# Patient Record
Sex: Female | Born: 1940 | Race: White | Hispanic: No | Smoking: Former smoker
Health system: Southern US, Community
[De-identification: ages and names within clinical notes are randomized; demographics above are authoritative.]

## PROBLEM LIST (undated history)

## (undated) DIAGNOSIS — E785 Hyperlipidemia, unspecified: Secondary | ICD-10-CM

## (undated) DIAGNOSIS — I10 Essential (primary) hypertension: Secondary | ICD-10-CM

## (undated) DIAGNOSIS — J449 Chronic obstructive pulmonary disease, unspecified: Secondary | ICD-10-CM

## (undated) DIAGNOSIS — D801 Nonfamilial hypogammaglobulinemia: Secondary | ICD-10-CM

## (undated) DIAGNOSIS — D631 Anemia in chronic kidney disease: Secondary | ICD-10-CM

## (undated) DIAGNOSIS — N183 Chronic kidney disease, stage 3 (moderate): Secondary | ICD-10-CM

## (undated) DIAGNOSIS — C9000 Multiple myeloma not having achieved remission: Secondary | ICD-10-CM

## (undated) DIAGNOSIS — Z87891 Personal history of nicotine dependence: Secondary | ICD-10-CM

## (undated) DIAGNOSIS — E538 Deficiency of other specified B group vitamins: Secondary | ICD-10-CM

## (undated) DIAGNOSIS — I2510 Atherosclerotic heart disease of native coronary artery without angina pectoris: Secondary | ICD-10-CM

## (undated) HISTORY — DX: Hyperlipidemia, unspecified: E78.5

## (undated) HISTORY — DX: Anemia in chronic kidney disease: D63.1

## (undated) HISTORY — DX: Deficiency of other specified B group vitamins: E53.8

## (undated) HISTORY — DX: Multiple myeloma not having achieved remission: C90.00

## (undated) HISTORY — DX: Personal history of nicotine dependence: Z87.891

## (undated) HISTORY — DX: Nonfamilial hypogammaglobulinemia: D80.1

## (undated) HISTORY — DX: Atherosclerotic heart disease of native coronary artery without angina pectoris: I25.10

## (undated) HISTORY — DX: Chronic obstructive pulmonary disease, unspecified: J44.9

## (undated) HISTORY — DX: Essential (primary) hypertension: I10

## (undated) HISTORY — PX: BACK SURGERY: SHX140

## (undated) HISTORY — DX: Chronic kidney disease, stage 3 (moderate): N18.3

## (undated) HISTORY — PX: NECK SURGERY: SHX720

---

## 1998-04-16 ENCOUNTER — Ambulatory Visit (HOSPITAL_COMMUNITY): Admission: RE | Admit: 1998-04-16 | Discharge: 1998-04-16 | Payer: Self-pay | Admitting: Neurological Surgery

## 1999-03-25 ENCOUNTER — Inpatient Hospital Stay (HOSPITAL_COMMUNITY): Admission: AD | Admit: 1999-03-25 | Discharge: 1999-03-26 | Payer: Self-pay | Admitting: Cardiology

## 1999-05-25 ENCOUNTER — Encounter: Payer: Self-pay | Admitting: Neurological Surgery

## 1999-05-25 ENCOUNTER — Encounter: Admission: RE | Admit: 1999-05-25 | Discharge: 1999-05-25 | Payer: Self-pay | Admitting: Neurological Surgery

## 2005-11-08 ENCOUNTER — Encounter: Payer: Self-pay | Admitting: Neurological Surgery

## 2007-05-30 ENCOUNTER — Ambulatory Visit: Payer: Self-pay | Admitting: Cardiology

## 2007-07-01 ENCOUNTER — Ambulatory Visit: Payer: Self-pay | Admitting: Cardiology

## 2007-08-26 ENCOUNTER — Ambulatory Visit: Payer: Self-pay | Admitting: Cardiology

## 2010-11-17 LAB — PULMONARY FUNCTION TEST

## 2010-11-22 NOTE — Assessment & Plan Note (Signed)
[facility]                          [place] [facility] OFFICE NOTE   AARIAN, Bradley                         MRN:          [id number]  DATE:07/01/2007                            DOB:          1940/10/10    [name] is seen for cardiology followup.  She is not having any chest  discomfort.  In general, she is not feeling well, but it is not exactly  clear why she is feeling poorly.  We had seen her from a recent  hospitalization on May 30, 2007.  At that time, she had  hypertension and some exertional shortness of breath, and a headache.  During the hospitalization, there was some improvement in the headache.  Eventually, neurology was involved, and she is still seeing neurology.  We decided to proceed with 2D echo.  This study revealed that the  patient's overall left ventricular function was normal.  The ejection  fraction was 60-65%.  There was mild diastolic dysfunction.  There was  evidence of pulmonary hypertension with a right ventricular systolic  pressure estimate of 53 mmHg.  The patient also underwent a Cardiolite  scan.  This was done using adenosine.  The study revealed that there was  no scar or ischemia.  Because of the pulmonary hypertension and because  of some abnormality in her chest x-ray, there was consideration given to  the patient having a CT scan of the chest.  She did have a VQ scan that  showed no pulmonary emboli.  I cannot document that a chest CT was ever  done.   Ultimately, the patient stabilized and she was discharged home.  She is  now here for followup.  She is not having any chest pain or marked  shortness of breath.  She just does not feel well in general and is  quite concerned.   PAST MEDICAL HISTORY:   ALLERGIES:  No known drug allergies.   MEDICATIONS:  1. Aspirin 81.  2. Metoprolol 25 b.i.d.   OTHER MEDICAL PROBLEMS:  See the list below.   REVIEW OF SYSTEMS:  Today, she continues to have some  mild headaches and  some light-headedness.  She does have some mild shortness of breath.  Also, her blood pressure is elevated and she is concerned about this.  Otherwise, her review of systems is negative.   PHYSICAL EXAM:  Blood pressure today is 166/93, pulse 78.  The patient is oriented to person, time, and place.  Affect is normal,  but she is concerned about how she is feeling.  LUNGS:  Clear.  Respiratory effort is not labored.  HEENT:  Reveals no xanthelasma.  She has normal extraocular motions.  There are no carotid bruits.  There is no jugular venous distension.  CARDIAC:  Reveals an S1 with an S2.  There are no clicks or significant  murmurs.  ABDOMEN:  Soft.  She has no masses or bruits.  There is no significant  peripheral edema.   EKG today reveals Q waves in V1 and V2.  There is normal sinus rhythm.   PROBLEMS:  1. Pulmonary hypertension.  Right ventricular systolic pressure has      been in the range of 50 mmHg.  This may be due to intrinsic lung      disease.  I think that it would be helpful to consider pulmonary      consultation and/or pulmonary function studies.  However, these      have not been arranged.  There was question also in the past that      she might have some parenchymal lung disease.  There was a question      of some old mediastinal nodes on 1 study.  Her x-ray during her      most recent admission revealed that she had some slight      hyperinflation with question of some emphysematous changes.  There      was no new acute process.  2. Recent chest discomfort with normal Cardiolite.  3. Normal left ventricular function by 2D echo.  4. Remote tobacco history.  5. Hypertension.  The patient does need additional medicines for her      blood pressure and I will start an ACE inhibitor today.  6. History of hyperlipidemia and this needs to be followed up.  7. Ongoing headaches.  This is being assessed by neurology.  8. Mild diastolic dysfunction.    I will add lisinopril for her blood pressure.  She will follow up with  Dr. Sherril Croon and with Dr. Ninetta Lights of neurology.  It may be that her  pulmonary hypertension is related to chronic obstructive pulmonary  disease.     Luis Abed, MD, [facility]  Electronically Signed    JDK/MedQ  DD: 07/01/2007  DT: 07/01/2007  Job #: [id number]   cc:   Mardella Layman

## 2011-02-10 ENCOUNTER — Telehealth: Payer: Self-pay | Admitting: *Deleted

## 2011-02-10 NOTE — Telephone Encounter (Signed)
Morrie Sheldon from [facility] office called and referred this pt for a right heart cath.  He requested Dr Gala Romney to do the procedure.  Pt has not been seen by our office.  She has had a recent echo but no EKG.  Informed Morrie Sheldon that I will review with Dr Gala Romney to see if the pt needs to be seen first and then cathed or if she can scheduled.  Morrie Sheldon will fax records to [place].

## 2011-02-13 NOTE — Telephone Encounter (Signed)
Please get records and set up for RHC in [facility]. I will see her there and do brief H&P.

## 2011-02-21 NOTE — Telephone Encounter (Signed)
Per Meredith Staggers, RN this is being taken care of.

## 2011-03-02 NOTE — Telephone Encounter (Signed)
Have been trying to reach pt for a couple of weeks to sch cath, have left several mess for her to call back

## 2011-03-24 ENCOUNTER — Encounter (HOSPITAL_COMMUNITY): Payer: Self-pay | Admitting: *Deleted

## 2011-03-24 NOTE — Telephone Encounter (Signed)
After weeks of phone tag have finally gotten pt scheduled for RHC on Mon 10/8 (she needs Monday appts only) at 9:30, pt is aware and instructions reviewed with her via phone and copy mailed, she will go to the [place] center teh week of 10/1 for labs

## 2011-04-10 HISTORY — PX: CORONARY ANGIOPLASTY WITH STENT PLACEMENT: SHX49

## 2011-04-10 HISTORY — PX: CARDIAC CATHETERIZATION: SHX172

## 2011-05-03 ENCOUNTER — Inpatient Hospital Stay (HOSPITAL_BASED_OUTPATIENT_CLINIC_OR_DEPARTMENT_OTHER)
Admission: RE | Admit: 2011-05-03 | Discharge: 2011-05-03 | Disposition: A | Discharge status: Hospice | Payer: Medicare Other | Source: Ambulatory Visit | Attending: Internal Medicine | Admitting: Internal Medicine

## 2011-05-03 ENCOUNTER — Ambulatory Visit (HOSPITAL_COMMUNITY)
Admission: RE | Admit: 2011-05-03 | Discharge: 2011-05-04 | Disposition: A | Discharge status: Home / Self Care | Payer: Medicare Other | Source: Ambulatory Visit | Attending: Cardiovascular Disease | Admitting: Cardiovascular Disease

## 2011-05-03 DIAGNOSIS — I10 Essential (primary) hypertension: Secondary | ICD-10-CM | POA: Insufficient documentation

## 2011-05-03 DIAGNOSIS — J449 Chronic obstructive pulmonary disease, unspecified: Secondary | ICD-10-CM | POA: Insufficient documentation

## 2011-05-03 DIAGNOSIS — J4489 Other specified chronic obstructive pulmonary disease: Secondary | ICD-10-CM | POA: Insufficient documentation

## 2011-05-03 DIAGNOSIS — I2789 Other specified pulmonary heart diseases: Secondary | ICD-10-CM | POA: Insufficient documentation

## 2011-05-03 DIAGNOSIS — I2510 Atherosclerotic heart disease of native coronary artery without angina pectoris: Secondary | ICD-10-CM

## 2011-05-03 DIAGNOSIS — R0989 Other specified symptoms and signs involving the circulatory and respiratory systems: Secondary | ICD-10-CM | POA: Insufficient documentation

## 2011-05-03 DIAGNOSIS — I279 Pulmonary heart disease, unspecified: Secondary | ICD-10-CM

## 2011-05-03 DIAGNOSIS — R0609 Other forms of dyspnea: Secondary | ICD-10-CM | POA: Insufficient documentation

## 2011-05-03 LAB — POCT I-STAT 3, VENOUS BLOOD GAS (G3P V)
Acid-base deficit: 3 mmol/L — ABNORMAL HIGH (ref 0.0–2.0)
Bicarbonate: 22.9 mEq/L (ref 20.0–24.0)
Bicarbonate: 25.2 mEq/L — ABNORMAL HIGH (ref 20.0–24.0)
O2 Saturation: 66 %
O2 Saturation: 58 %
O2 Saturation: 65 %
TCO2: 24 mmol/L (ref 0–100)
pCO2, Ven: 43.7 mmHg — ABNORMAL LOW (ref 45.0–50.0)
pH, Ven: 7.327 — ABNORMAL HIGH (ref 7.250–7.300)
pO2, Ven: 37 mmHg (ref 30.0–45.0)
pO2, Ven: 35 mmHg (ref 30.0–45.0)

## 2011-05-03 LAB — POCT ACTIVATED CLOTTING TIME
Activated Clotting Time: 138 seconds
Activated Clotting Time: 408 seconds

## 2011-05-03 LAB — POCT I-STAT 3, ART BLOOD GAS (G3+)
Acid-base deficit: 1 mmol/L (ref 0.0–2.0)
Bicarbonate: 23.4 mEq/L (ref 20.0–24.0)
O2 Saturation: 92 %
TCO2: 25 mmol/L (ref 0–100)
pCO2 arterial: 39.2 mmHg (ref 35.0–45.0)
pH, Arterial: 7.384 (ref 7.350–7.400)
pO2, Arterial: 63 mmHg — ABNORMAL LOW (ref 80.0–100.0)

## 2011-05-04 LAB — LIPID PANEL
Cholesterol: 168 mg/dL (ref 0–200)
HDL: 35 mg/dL — ABNORMAL LOW (ref >39)
Total CHOL/HDL Ratio: 4.8 RATIO

## 2011-05-04 LAB — BASIC METABOLIC PANEL
Chloride: 105 mEq/L (ref 96–112)
GFR calc Af Amer: 70 mL/min — ABNORMAL LOW (ref >90)
Potassium: 3.9 mEq/L (ref 3.5–5.1)

## 2011-05-04 LAB — CBC
Platelets: 166 10*3/uL (ref 150–400)
RDW: 12.9 % (ref 11.5–15.5)
WBC: 7.2 10*3/uL (ref 4.0–10.5)

## 2011-05-05 ENCOUNTER — Emergency Department (HOSPITAL_COMMUNITY): Payer: Medicare Other

## 2011-05-05 ENCOUNTER — Observation Stay (HOSPITAL_COMMUNITY)
Admission: EM | Admit: 2011-05-05 | Discharge: 2011-05-07 | Disposition: A | Discharge status: Home / Self Care | Payer: Medicare Other | Attending: Emergency Medicine | Admitting: Emergency Medicine

## 2011-05-05 DIAGNOSIS — R42 Dizziness and giddiness: Secondary | ICD-10-CM | POA: Insufficient documentation

## 2011-05-05 DIAGNOSIS — R0989 Other specified symptoms and signs involving the circulatory and respiratory systems: Principal | ICD-10-CM | POA: Insufficient documentation

## 2011-05-05 DIAGNOSIS — E860 Dehydration: Secondary | ICD-10-CM | POA: Insufficient documentation

## 2011-05-05 DIAGNOSIS — Z01810 Encounter for preprocedural cardiovascular examination: Secondary | ICD-10-CM | POA: Insufficient documentation

## 2011-05-05 DIAGNOSIS — J449 Chronic obstructive pulmonary disease, unspecified: Secondary | ICD-10-CM | POA: Insufficient documentation

## 2011-05-05 DIAGNOSIS — R141 Gas pain: Secondary | ICD-10-CM | POA: Insufficient documentation

## 2011-05-05 DIAGNOSIS — R0602 Shortness of breath: Secondary | ICD-10-CM

## 2011-05-05 DIAGNOSIS — R0609 Other forms of dyspnea: Principal | ICD-10-CM | POA: Insufficient documentation

## 2011-05-05 DIAGNOSIS — Z9861 Coronary angioplasty status: Secondary | ICD-10-CM | POA: Insufficient documentation

## 2011-05-05 DIAGNOSIS — R143 Flatulence: Secondary | ICD-10-CM | POA: Insufficient documentation

## 2011-05-05 DIAGNOSIS — I10 Essential (primary) hypertension: Secondary | ICD-10-CM | POA: Insufficient documentation

## 2011-05-05 DIAGNOSIS — Z23 Encounter for immunization: Secondary | ICD-10-CM | POA: Insufficient documentation

## 2011-05-05 DIAGNOSIS — I2510 Atherosclerotic heart disease of native coronary artery without angina pectoris: Secondary | ICD-10-CM | POA: Insufficient documentation

## 2011-05-05 DIAGNOSIS — N63 Unspecified lump in unspecified breast: Secondary | ICD-10-CM | POA: Insufficient documentation

## 2011-05-05 DIAGNOSIS — R142 Eructation: Secondary | ICD-10-CM | POA: Insufficient documentation

## 2011-05-05 DIAGNOSIS — J4489 Other specified chronic obstructive pulmonary disease: Secondary | ICD-10-CM | POA: Insufficient documentation

## 2011-05-05 LAB — CBC
Hemoglobin: 13.2 g/dL (ref 12.0–15.0)
MCH: 30.7 pg (ref 26.0–34.0)
MCHC: 35.8 g/dL (ref 30.0–36.0)
MCV: 85.8 fL (ref 78.0–100.0)

## 2011-05-05 LAB — BASIC METABOLIC PANEL
CO2: 25 mEq/L (ref 19–32)
Chloride: 101 mEq/L (ref 96–112)
Creatinine, Ser: 0.88 mg/dL (ref 0.50–1.10)
GFR calc Af Amer: 75 mL/min — ABNORMAL LOW (ref >90)
Potassium: 3.6 mEq/L (ref 3.5–5.1)
Sodium: 137 mEq/L (ref 135–145)

## 2011-05-05 LAB — CARDIAC PANEL(CRET KIN+CKTOT+MB+TROPI)
CK, MB: 4.7 ng/mL — ABNORMAL HIGH (ref 0.3–4.0)
Relative Index: 3.2 — ABNORMAL HIGH (ref 0.0–2.5)
Total CK: 147 U/L (ref 7–177)
Troponin I: <0.3 ng/mL (ref <0.30)

## 2011-05-05 LAB — DIFFERENTIAL
Basophils Relative: 0 % (ref 0–1)
Eosinophils Absolute: 0.1 10*3/uL (ref 0.0–0.7)
Lymphs Abs: 1.6 10*3/uL (ref 0.7–4.0)
Monocytes Absolute: 0.9 10*3/uL (ref 0.1–1.0)
Monocytes Relative: 8 % (ref 3–12)
Neutro Abs: 8.2 10*3/uL — ABNORMAL HIGH (ref 1.7–7.7)

## 2011-05-05 LAB — APTT: aPTT: 29 seconds (ref 24–37)

## 2011-05-05 LAB — POCT I-STAT TROPONIN I: Troponin i, poc: 0.02 ng/mL (ref 0.00–0.08)

## 2011-05-05 MED ORDER — IOHEXOL 350 MG/ML SOLN
80.0000 mL | Freq: Once | INTRAVENOUS | Status: AC | PRN
  Administered 2011-05-05: 80 mL via INTRAVENOUS

## 2011-05-06 DIAGNOSIS — I2510 Atherosclerotic heart disease of native coronary artery without angina pectoris: Secondary | ICD-10-CM

## 2011-05-06 LAB — CARDIAC PANEL(CRET KIN+CKTOT+MB+TROPI)
Relative Index: 2.9 — ABNORMAL HIGH (ref 0.0–2.5)
Total CK: 149 U/L (ref 7–177)
Troponin I: <0.3 ng/mL (ref <0.30)

## 2011-05-06 LAB — CBC
MCHC: 35.1 g/dL (ref 30.0–36.0)
Platelets: 189 10*3/uL (ref 150–400)
RDW: 13 % (ref 11.5–15.5)
WBC: 7.6 10*3/uL (ref 4.0–10.5)

## 2011-05-06 LAB — BASIC METABOLIC PANEL
Chloride: 106 mEq/L (ref 96–112)
GFR calc Af Amer: 66 mL/min — ABNORMAL LOW (ref >90)
GFR calc non Af Amer: 57 mL/min — ABNORMAL LOW (ref >90)
Potassium: 4.3 mEq/L (ref 3.5–5.1)
Sodium: 141 mEq/L (ref 135–145)

## 2011-05-07 NOTE — H&P (Addendum)
Carol Bradley, Carol Bradley NO.:  [id number]  MEDICAL RECORD NO.:  [id number]  LOCATION:  3712                         FACILITY:  [facility]  PHYSICIAN:  Veverly Fells. Excell Seltzer, MD  DATE OF BIRTH:  12-07-1940  DATE OF ADMISSION:  05/05/2011 DATE OF DISCHARGE:                             HISTORY & PHYSICAL   PRIMARY CARDIOLOGIST:  Lorine Bears, MD, in [place]  PRIMARY MEDICAL DOCTOR:  Dr. Sherril Croon  PULMONOLOGIST:  Dr. Orson Aloe  CHIEF COMPLAINT:  Shortness of breath and dizziness.  HISTORY OF PRESENT ILLNESS:  Carol Bradley is a 70 year old lady with a history of COPD, hypertension who was just admitted May 03, 2011 to May 04, 2011 for shortness of breath.  She had recently undergone evaluation for COPD and was found to have decreased DLCO on PFTs, please see prior H and P for full details.  She underwent right and left heart catheterizations showing normal right heart pressures, but did have newly diagnosed coronary artery disease and ended up with a drug-eluting stent to her mid RCA.  She felt great yesterday and went home.  However, this morning around 9:30 in the morning, she leaned over to turn on a lamp.  When she stood up, she felt dizzy and developed subsequent shortness of breath.  She also felt a sensation of elevated heart rate. She sat down.  The dizziness did not improve.  The shortness of breath did slightly, but shortness of breath was still present when EMS arrived.  Oxygen helped this.  She was likely tachycardic on arrival to the ER with heart rate in the 90s, blood pressure was stable.  She has a mildly elevated white blood cell count of 10.8.  Chest x-ray is without acute disease.  EKG showed normal sinus rhythm, sinus arrhythmia, and slight ST depression V5-V6, nonspecific.  She still feels somewhat lightheaded and dizzy, but is sinus on the monitor.  PAST MEDICAL HISTORY: 1. CAD with dyspnea, felt possibly anginal equivalent, status post  drug-eluting stent placement to the RCA on May 03, 2011 with     residual disease in the proximal RCA and LAD and ramus, for medical     therapy. 2. a.  Normal right heart pressures. 3. b.  Normal LV function and also 60%-65% ejection fraction. 4. COPD. 5. Hypertension. 6. History of tobacco abuse.  MEDICATIONS: 1. Plavix 75 mg daily. 2. Nitroglycerin sublingual 0.4 mg every 5 minutes as needed up to 3     doses for chest pain. 3. Zocor 40 mg daily. 4. Amlodipine 5 mg daily. 5. Aspirin 81 mg daily. 6. Spiriva 18 mcg inhaled daily. 7. Triamterene/hydrochlorothiazide 37.5/25 half tablet daily.  ALLERGIES:  No known drug allergies.  SOCIAL HISTORY:  Carol Bradley lives in [place].  She is a retired Financial planner.  She is married.  She is accompanied by her son today. She quit smoking in 1996, but smoked 2 packs per day for many years. She denies alcohol use.  FAMILY HISTORY:  Mother died of CHF and father died of cancer at 68. Negative for premature CAD.  REVIEW OF SYSTEMS:  No fevers, chills, sweats, vomiting or diarrhea. Positive  for nausea.  All other systems reviewed and otherwise negative.  LABORATORY DATA:  WBC 10.8, hemoglobin 13.2, hematocrit 36.9, platelet count 198.  Sodium 137, potassium 3.6, chloride 101, CO2 of 25, glucose 126, BUN 16, creatinine 0.88.  Troponin negative.  EKG showed normal sinus rhythm/sinus arrhythmia, 92 beats per minute with nonspecific ST-T changes.  RADIOLOGIC STUDIES:  Chest x-ray showed no active disease.  Subsegmental atelectasis at the left lung base.  PHYSICAL EXAMINATION:  VITAL SIGNS:  Temp 97.8, pulse 99, respirations 18, blood pressure136/71, pulse ox 99% on 2 L. GENERAL:  [name], well appearing, in no acute distress. HEENT:  Normocephalic, atraumatic.  Extraocular movements intact.  Clear sclerae.  Nares are without discharge. NECK:  Supple without carotid bruit. HEART:  Auscultation of the  heart reveals regular rate and rhythm with S1 and S2 without murmurs, rubs, or gallops.  She is borderline tachycardic, but not typically over 100. LUNGS:  Sounds are coarse at the bases, but otherwise free of wheezes, rales, or rhonchi. ABDOMEN:  Soft, nontender, and nondistended.  Positive bowel sounds. SKIN:  Warm and dry without edema. NEUROLOGIC:  She is alert and oriented x3.  Responds to questions appropriately with a normal affect.  ASSESSMENT/PLAN:  The patient was seen and examined by Dr. Excell Seltzer and myself.  Carol Bradley is a pleasant 70 year old lady with a history of hypertension, COPD, and newly diagnosed coronary artery disease status post drug-eluting stent placement to the RCA on May 03, 2011 who presents with an episode of shortness of breath and dizziness after standing up.  At this time, we feel that she may actually be somewhat volume depleted, possibly orthostatic, and we will go ahead and hydrate her.  We agree with CT angio given her recent procedure, especially in light of her  complaints of chronic dyspnea.  The ER has already ordered this.  We will cycle cardiac enzymes.  Her home medications will be continued with the exception of discontinuing her triamterene/hydrochlorothiazide in the setting of volume depletion.  We will also substitute Lipitor or Zocor given that she is prominently on amlodipine.  She may be a candidate for discharge in the morning if she is feeling well with no further complaints.  We will also check orthostatic vital signs now and in the morning.     Ronie Spies, P.A.C.   ______________________________ Veverly Fells. Excell Seltzer, MD    DD/MEDQ  D:  05/05/2011  T:  05/06/2011  Job:  [id number]  cc:   Lorine Bears, MD Dr. Sherril Croon  Electronically Signed by Tonny Bollman MD on 05/07/2011 10:08:33 PM Electronically Signed by Ronie Spies  on 05/09/2011 02:09:50 PM

## 2011-05-10 NOTE — Cardiovascular Report (Signed)
  NAMEREIGN, DZIUBA                  ACCOUNT NO.:  [id number]  MEDICAL RECORD NO.:  [id number]  LOCATION:  2507                         FACILITY:  [facility]  PHYSICIAN:  Lorine Bears, MD     DATE OF BIRTH:  1941-06-19  DATE OF PROCEDURE:  05/03/2011 DATE OF DISCHARGE:                           CARDIAC CATHETERIZATION   REFERRING CARDIOLOGIST:  Bevelyn Buckles. Bensimhon, MD  PROCEDURES PERFORMED:  Right coronary artery angioplasty and drug- eluting stent placement to the mid segment for an initial 80% stenosis, which was reduced to 0% stenosis.  ACCESS:  Right femoral artery through the previously placed sheath with a 6-French system.  INDICATION AND CLINICAL HISTORY:  This is a 70 year old female who was referred to Dr. Gala Romney for evaluation of exertional dyspnea.  She underwent right and left cardiac catheterization, which showed an 80% hazy lesion in the mid right coronary artery, with no other obstructive disease.  It was felt that this lesion might be responsible for her exertional dyspnea.  Angioplasty and stent placement was recommended. The risks, benefits, and alternatives were discussed with the patient.  STUDY DETAILS:  A standard informed consent was obtained before the diagnostic cardiac catheterization.  The right groin area was prepped in a sterile fashion with 2 sheaths in place, one in the vein and one in the artery.  Both were exchanged.  The 7-French sheath was exchanged into a 7-French venous sheath.  A 4-French arterial sheath was exchanged to a 6-French sheath.  IV bivalirudin was initiated with therapeutic ACT.  The patient has already received aspirin 325 mg as well as Plavix 600 mg x1.  I then used a JR4 guiding catheter.  The lesion was crossed relatively easily with a [place] wire.  It was pre-dilated with a 2.5 x 20 mm balloon to 10 atmospheres.  I then placed a 3.0 x 38 mm PROMUS Element drug-eluting stent and deployed it to 14 atmospheres.  This  was post dilated with a 3.25 x 21 mm [place] Sprinter to 12 atmospheres x2 inflations.  Angiography showed excellent results with a step-up and a step-down.  There was a residual 30-40% stenosis proximal to the stent which I decided to leave without intervention and to be treated medically.  The guiding catheter was removed over a wire, and the sheath was sutured in place to be removed manually.  The patient tolerated the procedure well with no immediate complications.  STUDY CONCLUSIONS:  Successful angioplasty and drug-eluting stent placement to the mid right coronary artery with a long drug-eluting stent.  RECOMMENDATIONS:  I recommend dual antiplatelet therapy for at least 1 year.  Aggressive treatment of risk factors is recommended.     Lorine Bears, MD     MA/MEDQ  D:  05/03/2011  T:  05/03/2011  Job:  [id number]  cc:   Bevelyn Buckles. Bensimhon, MD  Electronically Signed by Lorine Bears MD on 05/10/2011 05:44:57 PM

## 2011-05-12 NOTE — Cardiovascular Report (Signed)
NAMEBRITTISH, BOLINGER NO.:  [id number]  MEDICAL RECORD NO.:  [id number]  LOCATION:  2507                         FACILITY:  [facility]  PHYSICIAN:  Bevelyn Buckles. Olar Santini, MDDATE OF BIRTH:  01/26/1941  DATE OF PROCEDURE:  05/03/2011 DATE OF DISCHARGE:                           CARDIAC CATHETERIZATION   PATIENT IDENTIFICATION:  Carol Bradley is a 70 year old woman with a history of COPD and hypertension.  She has had recent onset of exertional dyspnea.  PFTs showed mild-to-moderate COPD with moderate-to-severely depressed diffusion capacity.  She was seen by Dr. Orson Aloe and referred for right heart catheterization to exclude pulmonary hypertension.  We also performed a left heart catheterization given her exertional symptoms and risk factors for coronary artery disease.  PROCEDURES PERFORMED: 1. Right heart catheterization. 2. Left heart catheterization. 3. Left ventriculogram. 4. Aortic root angiogram. 5. Selective coronary angiography.  DESCRIPTION OF PROCEDURE:  The risks and indication were explained. Consent was signed and placed on the chart.  The right groin area was prepped and draped in routine sterile fashion and anesthetized with 1% local lidocaine.  A 4-French arterial sheath was placed in the right femoral artery.  Standard catheters including a JL-4, 3D RCA, and angled pigtail were used.  All catheter exchange was made over wire.  No apparent complications.  A 7-French venous sheath was placed in the right femoral vein and standard Swan-Ganz catheter was used.  Once again, no apparent complications.  Right atrial pressure mean of 2, RV pressure 38/0 with an EDP of 5, PA pressure 36/12 with a mean of 22.  Pulmonary capillary wedge pressure mean of 4.  Central aortic pressure 148/67 with a mean of 103, LV pressure 148/7 with an EDP of 14.  There was no aortic stenosis.  Fick cardiac output 5.1, cardiac index 2.7.  Pulmonary vascular resistance was 3.5  Woods units.  Saturations; central aortic saturation was 92%.  PA saturation was 65% and 66%.  SVC saturation was 58%.  CORONARY ANATOMY:  Left main bifurcated into an LAD and a ramus branch. There was no AV groove circumflex branch visualized.  Left main was normal.  LAD was a long vessel coursing to the apex, gave off a large proximal diagonal.  There was a tubular 30% lesion through the proximal and midsection.  The ramus branch was a moderate-to-large vessel, had a 20% lesion proximally.  Right coronary artery was a very large dominant vessel, gave off an RV branch, a large PDA, and a large posterolateral feeding a lot of the circumflex territory.  Through the mid RCA, there was diffuse 50% lesion.  In the mid to distal RCA around the bend, there was a focal 80% to 90% lesion, which was mildly hazy.  Left ventriculogram done in the RAO position showed an EF of 55% to 60% with no regional wall motion abnormalities.  Aortic root angiogram did not show any anomalous vessels feeding the circumflex territory.  On panning down over the aorta, we did not see any abdominal aortic aneurysm though the aorta was not well opacified.  ASSESSMENT: 1. Coronary artery disease with significant one-vessel disease in the     right coronary artery  as described above. 2. Normal left ventricular function with normal left-sided filling     pressures. 3. Borderline pulmonary arterial hypertension.  PLAN/DISCUSSION:  I suspect [name]'s symptoms are combination of her COPD and coronary artery disease.  We will proceed with percutaneous intervention on the right coronary artery today.  That said, I am somewhat curious as to why her DLCO was still low.  I have discussed this with Dr. Orson Aloe and we will plan to follow serial echocardiograms and 6-minute walks to make sure this is not changing and she is not developing further pulmonary hypertension.  I would not treat with a pulmonary  vasodilator at this point.     Bevelyn Buckles. Idamae Coccia, MD     DRB/MEDQ  D:  05/03/2011  T:  05/03/2011  Job:  [id number]  cc:   Elise Benne, M.D. Doreen Beam, MD  Electronically Signed by Arvilla Meres MD on 05/12/2011 02:08:41 PM

## 2011-05-12 NOTE — Discharge Summary (Signed)
NAMERUBLE, BUTTLER NO.:  [id number]  MEDICAL RECORD NO.:  [id number]  LOCATION:  2507                         FACILITY:  [facility]  PHYSICIAN:  Bevelyn Buckles. Bensimhon, MDDATE OF BIRTH:  08-Aug-1940  DATE OF ADMISSION:  05/03/2011 DATE OF DISCHARGE:  05/04/2011                              DISCHARGE SUMMARY   PRIMARY CARE PHYSICIAN:  Dr. Sherril Croon.  PULMONOLOGIST:  Dr. Cherie Ouch IV with [facility] at medical office building [address], [address], [place] [address], phone number [phone].  PRIMARY FINAL DISCHARGE DIAGNOSIS:  Progressive dyspnea, possibly anginal equivalence, status post PTCA and 3.0 x 38 mm Promus drug- eluting stent to the RCA.  SECONDARY DIAGNOSES: 1. Chronic obstructive pulmonary disease. 2. Hypertension. 3. Preserved left ventricular function with an EF of 60-65% at echo     and 55-60% at cath. 4. Normal right heart pressures with a PA as of 36, wedge of 4 and a     mean of 22. 5. Residual coronary artery disease of 50% in the proximal RCA, 30% in     the LAD and 20% in the ramus intermedius, treated medically. 6. Remote history of tobacco use, quit 16 years ago.  TIME AT DISCHARGE:  38 minutes.  HOSPITAL COURSE:  Carol Bradley is a 70 year old female with no previous history of coronary artery disease.  She had been experiencing dyspnea and right heart cath as well as left heart cath is recommended.  She was admitted for this on May 03, 2011.  The cardiac catheterization showed the distal RCA to have an 80-90% lesion which was treated by Dr. Kirke Corin with PTCA stent described above reducing the stenosis to 0.  Her right heart pressures were normal and residual coronary artery disease is to be treated medically.  On May 04, 2011, Ms. Made was seen by Dr. Gala Romney.  She was seen by cardiac rehab.  She was ambulating without chest pain or shortness of breath.  She had dyslipidemia with an HDL of 35  and an LDL of 105, so statin will be added to her medication.  DR. Gala Romney considered her stable for discharge on May 04, 2011, to follow up as an outpatient.  DISCHARGE INSTRUCTIONS:  Her activity level is to be as tolerated.  She is to call our office if any problems with her cath site.  She is encouraged to stick to a low sodium heart healthy diet.  She is to follow up with Dr. Kittie Plater in [place] in 6 months.  She is to follow up with Dr. Kirke Corin on November 8th at 1:45.  She is to follow up with Dr. Orson Aloe and Dr. Sherril Croon as needed or as scheduled.  DISCHARGE MEDICATIONS: 1. Spiriva daily. 2. Amlodipine 5 mg daily. 3. Zocor 40 mg daily. 4. Aspirin 81 mg daily. 5. Triamterene and hydrochlorothiazide 37.5/25 one half tablet daily. 6. Plavix 75 mg daily. 7. Sublingual nitroglycerin p.r.n.     Theodore Demark, PA-C   ______________________________ Bevelyn Buckles. Bensimhon, MD    RB/MEDQ  D:  05/04/2011  T:  05/04/2011  Job:  [id number]  cc:   Marylou Mccoy, MD Dr.  Vyas  Electronically Signed by Theodore Demark PA-C on 05/11/2011 04:03:01 PM Electronically Signed by Arvilla Meres MD on 05/12/2011 02:08:45 PM

## 2011-05-12 NOTE — H&P (Signed)
Carol Bradley, Carol Bradley                  ACCOUNT NO.:  [id number]  MEDICAL RECORD NO.:  [id number]  LOCATION:                                 FACILITY:  PHYSICIAN:  Bevelyn Buckles. Faelynn Wynder, MDDATE OF BIRTH:  [birth year]  DATE OF ADMISSION: DATE OF DISCHARGE:                             HISTORY & PHYSICAL   REFERRING PHYSICIAN:  Cherie Ouch, MD  PRIMARY CARE PHYSICIAN:  Doreen Beam, MD  REASON FOR CONSULTATION:  Progressive dyspnea, need for right heart cath.  Carol Bradley is a delightful 70 year old woman with a history of COPD and hypertension.  She denies any history of known coronary artery disease.  She has not had a recent stress test or cardiac catheterization.  She tells me that about 6 months ago, she began experiencing dyspnea mostly with hills and steps.  She also had some mild lower extremity edema.  She saw Dr. Sherril Croon.  He started her on a diuretic and also ordered PFTs.  She did get some relief with diuretic but remained short of breath.  PFT showed moderate Gold stage II obstructive lung disease with an FEV1 of 1.47 which is 58% of predicted.  FVC was 2.43 which is 72% predicted.  The ratio was 80%.  However, DLCO was markedly depressed at 12 which was 42% of predicted.  She has also been placed on bronchodilators with only minimal response.  She notes that she can do all her housework but any time she tries to walk vigorously or go up hills, she gets dyspneic.  She has an occasional cough but no wheezing.  She denies any orthopnea, no PND.  Remainder of review of systems and all systems negative except for HPI and problem list.  Of note, she also had an echo which showed normal LV function with an EF of 60-65%.  There is evidence of diastolic dysfunction.  The RV was normal.  The right atrium was perhaps mildly enlarged.  The tricuspid valve had mild-to-moderate regurgitation with estimated RVSP of 35-45 mmHg.  The pulmonic valve was not well seen.  There was  mild regurgitation.  There was also mild left atrial enlargement.  PAST MEDICAL HISTORY: 1. COPD. 2. Hypertension.  MEDICATIONS: 1. Amlodipine 5 mg a day. 2. Triamterene 37.5 a day. 3. Spiriva. 4. Symbicort.  ALLERGIES:  None.  SOCIAL HISTORY:  She is a retired Financial planner.  She has history of heavy tobacco use, 3 packs per day for many years.  Stopped in 1996.  She is married.  She does not have any dust exposure.  FAMILY HISTORY:  Mother died with congestive heart failure.  Father died at 79 due to cancer.  There is no history of premature coronary artery disease.  PHYSICAL EXAMINATION:  GENERAL:  She is well appearing, in no acute distress. VITAL SIGNS:  Blood pressure is 148/81, heart rate 86, she is satting 95% on room air.  Her weight is 170. HEENT:  Normal. NECK:  Supple.  There is a previous scar from an anterior cervical disk fusion.  JVP is about 7-8 cm water with positive hepatojugular reflux. Carotids are 2+ bilaterally without bruits.  There is no  lymphadenopathy or thyromegaly. CARDIAC:  PMI is nondisplaced.  She is regular with somewhat distant heart sounds.  No obvious murmurs. LUNGS:  Have mildly decreased breath sounds throughout.  No wheezes or rales. ABDOMEN:  Mildly obese, nontender, nondistended.  No hepatosplenomegaly. No bruits.  No masses. EXTREMITIES:  Warm.  No cyanosis or clubbing.  There is trace to 1+ edema bilaterally.  There are no cords. NEUROLOGIC:  Alert and oriented x3.  Cranial nerves II-XII are intact. Moves all 4 extremities without difficulty.  Affect is pleasant.  LABORATORY DATA:  Sodium 138, potassium 3.9, chloride of 103, creatinine of 0.99, glucose of 89.  INR 0.9.  White count 6.3 hemoglobin 12.9, platelets 217,000.  I do not have an EKG on this chart.  ASSESSMENT: 1. Dyspnea on exertion, progressive over the past 6 months. 2. Chronic obstructive pulmonary disease. 3. Mildly abnormal echocardiogram. 4.  Hypertension.  PLAN/DISCUSSION:  I agree with Dr. Orson Aloe that pulmonary hypertension is certainly on the differential for her, particularly in light of her markedly depressed DLCO.  She is also at high risk for pulmonary venous hypertension in the setting of diastolic dysfunction.  Finally given her age and risk factors, I do think it is reasonable to proceed with coronary angiography to assess her arteries for obstructive coronary artery disease as a contributor to her symptoms.  I have explained this in depth to Carol Bradley, her husband, her son, and her pastor.  We discussed the indications and risks and they had agreed to proceed.     Bevelyn Buckles. Pellegrino Kennard, MD     DRB/MEDQ  D:  05/03/2011  T:  05/03/2011  Job:  [id number]  cc:   Elise Benne, M.D. Doreen Beam, MD  Electronically Signed by Arvilla Meres MD on 05/12/2011 02:08:37 PM

## 2011-05-16 ENCOUNTER — Encounter: Payer: Self-pay | Admitting: *Deleted

## 2011-05-18 ENCOUNTER — Ambulatory Visit (INDEPENDENT_AMBULATORY_CARE_PROVIDER_SITE_OTHER): Payer: Medicare Other | Admitting: Cardiovascular Disease

## 2011-05-18 ENCOUNTER — Encounter: Payer: Self-pay | Admitting: Cardiovascular Disease

## 2011-05-18 DIAGNOSIS — K3189 Other diseases of stomach and duodenum: Secondary | ICD-10-CM

## 2011-05-18 DIAGNOSIS — R1013 Epigastric pain: Secondary | ICD-10-CM

## 2011-05-18 DIAGNOSIS — E785 Hyperlipidemia, unspecified: Secondary | ICD-10-CM

## 2011-05-18 DIAGNOSIS — Z79899 Other long term (current) drug therapy: Secondary | ICD-10-CM

## 2011-05-18 DIAGNOSIS — I10 Essential (primary) hypertension: Secondary | ICD-10-CM

## 2011-05-18 DIAGNOSIS — I2510 Atherosclerotic heart disease of native coronary artery without angina pectoris: Secondary | ICD-10-CM

## 2011-05-18 MED ORDER — ATORVASTATIN CALCIUM 20 MG PO TABS: 20.0000 mg | ORAL_TABLET | Freq: Every day | ORAL | Status: DC

## 2011-05-18 NOTE — Patient Instructions (Signed)
Your physician wants you to follow-up in: 3 months. You will receive a reminder letter in the mail one-two months in advance. If you don't receive a letter, please call our office to schedule the follow-up appointment. Start Lipitor (atorvastatin) 20 mg Take 1 tablet by mouth every night. Resume Triam/HCTZ 37.5/25 mg 1/2 tablet daily. Your physician recommends that you go to the [facility] for a FASTING lipid profile and liver function labs. Do not eat or drink after midnight. DO LAB IN 6 WEEKS. If the results of your test are normal or stable, you will receive a letter. If they are abnormal, the nurse will contact you by phone.

## 2011-05-19 ENCOUNTER — Encounter: Payer: Self-pay | Admitting: Cardiovascular Disease

## 2011-05-19 DIAGNOSIS — R1013 Epigastric pain: Secondary | ICD-10-CM | POA: Insufficient documentation

## 2011-05-19 DIAGNOSIS — I10 Essential (primary) hypertension: Secondary | ICD-10-CM | POA: Insufficient documentation

## 2011-05-19 DIAGNOSIS — I2510 Atherosclerotic heart disease of native coronary artery without angina pectoris: Secondary | ICD-10-CM | POA: Insufficient documentation

## 2011-05-19 DIAGNOSIS — E785 Hyperlipidemia, unspecified: Secondary | ICD-10-CM | POA: Insufficient documentation

## 2011-05-19 NOTE — Assessment & Plan Note (Signed)
She was taken off simvastatin recently due to possible GI side effects. I will go ahead and start her on atorvastatin 20 mg once daily. Target LDL should be less than 70. I will request fasting lipid and liver profile to be done in 6 weeks.

## 2011-05-19 NOTE — Progress Notes (Signed)
HPI  [name] is a 70 year old woman with a history of COPD and hypertension. She has had recent onset of exertional dyspnea. PFTs showed mild-to-moderate COPD with moderate-to-severely depressed diffusion capacity. She was seen by Dr. Orson Aloe and referred for right heart catheterization to exclude pulmonary hypertension. A left heart catheterization was done given her exertional symptoms and risk factors for coronary artery disease. Right heart catheterization showed normal filling pressures with no evidence of pulmonary hypertension. Coronary angiography showed significant single-vessel coronary artery disease with an 80% mid right coronary artery stenosis with mild disease in the left anterior descending artery and left circumflex. Ejection fraction was normal. She underwent an angioplasty and drug-eluting stent placement to the right coronary artery without complications. She was discharged home in stable condition. However, she returned one day later with dizziness, dyspnea, chest discomfort as well as GI symptoms with belching and excessive gas. She had CTA of the chest which showed no evidence of pulmonary embolism. She ruled out for myocardial infarction. It was felt that some of her GI symptoms might have been due to simvastatin which was discontinued. She was also taken off her diuretic. Her labs did not show any evidence of renal failure. Overall, she has been feeling better. However, her dyspnea has not actually improved after the procedure. She did not have chest pain before her catheterization. Her GI symptoms improved but did have some belching today as well.   Allergies  Allergen Reactions  . Statins      Current Outpatient Prescriptions on File Prior to Visit  Medication Sig Dispense Refill  . amLODipine (NORVASC) 5 MG tablet Take 5 mg by mouth daily.        Marland Kitchen aspirin EC 81 MG tablet Take 81 mg by mouth daily.        . clopidogrel (PLAVIX) 75 MG tablet Take 75 mg by mouth daily.         . nitroGLYCERIN (NITROSTAT) 0.4 MG SL tablet Place 0.4 mg under the tongue every 5 (five) minutes as needed.        . tiotropium (SPIRIVA) 18 MCG inhalation capsule Place 18 mcg into inhaler and inhale daily.           Past Medical History  Diagnosis Date  . COPD (chronic obstructive pulmonary disease)   . History of tobacco abuse   . CAD (coronary artery disease)   . Hypertension   . Hyperlipidemia      Past Surgical History  Procedure Date  . Cardiac catheterization 04/2011    right and left cath showing normal right heart pressures,but newly diagnosed coronary artery disease/drug eluting stent placed to RCA with residual disease in the proximal RCA and LAD and ramus, normal LV function and 60-65% EF  . Coronary angioplasty with stent placement 04/2011    mid RCA: 3.0 X38 mm Promus DES. Residual 40% disease proximally     Family History  Problem Relation Age of Onset  . Heart failure Mother   . Cancer Father      History   Social History  . Marital Status: Married    Spouse Name: N/A    Number of Children: N/A  . Years of Education: N/A   Occupational History  . 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 Smoking status: Former Smoker -- 3.0 packs/day for 25 years    Types: Cigarettes    Quit date: 07/10/1994  . Smokeless tobacco: Never Used  .  Alcohol Use: No  . Drug Use: Not on file  . Sexually Active: Not on file   Other Topics Concern  . Not on file   Social History Narrative  . No narrative on file     PHYSICAL EXAM   BP 135/70  Pulse 88  Ht 5\' 6"  (1.676 m)  Wt 172 lb (78.019 kg)  BMI 27.76 kg/m2  SpO2 97%  Constitutional: She is oriented to person, place, and time. She appears well-developed and well-nourished. No distress.  HENT: No nasal discharge.  Head: Normocephalic and atraumatic.  Eyes: Pupils are equal, round, and reactive to light. Right eye exhibits no discharge. Left eye exhibits no discharge.  Neck:  Normal range of motion. Neck supple. No JVD present. No thyromegaly present.  Cardiovascular: Normal rate, regular rhythm, normal heart sounds and intact distal pulses. Exam reveals no gallop and no friction rub.  No murmur heard.  Pulmonary/Chest: Effort normal and breath sounds normal. No stridor. No respiratory distress. She has no wheezes. She has no rales. She exhibits no tenderness.  Abdominal: Soft. Bowel sounds are normal. She exhibits no distension. There is no tenderness. There is no rebound and no guarding.  Musculoskeletal: Normal range of motion. She exhibits no edema and no tenderness.  Neurological: She is alert and oriented to person, place, and time. Coordination normal.  Skin: Skin is warm and dry. No rash noted. She is not diaphoretic. No erythema. No pallor.  Psychiatric: She has a normal mood and affect. Her behavior is normal. Judgment and thought content normal.  Right groin is intact with no hematoma.    ASSESSMENT AND PLAN

## 2011-05-19 NOTE — Assessment & Plan Note (Signed)
Her blood pressure is reasonable. However, she is requesting to go back on her diuretic mainly for lower extremity edema. This will be resumed today.

## 2011-05-19 NOTE — Assessment & Plan Note (Signed)
The etiology of her dyspepsia still not clear but could be medication related. Will continue to monitor this.

## 2011-05-19 NOTE — Assessment & Plan Note (Signed)
The patient underwent recent angioplasty and drug-eluting stent placement to the right coronary artery. We hope that her dyspnea would improve after this but it appears that did not happen. The etiology of her dyspnea is still not clear. I recommend continuing dual antiplatelet therapy for one year after her stent. She will need aggressive treatment of her risk factors. She will likely benefit from cardiac rehabilitation.

## 2011-05-20 NOTE — Discharge Summary (Signed)
Carol Bradley, [name] NO.:  [id number]  MEDICAL RECORD NO.:  [id number]  LOCATION:  3712                         FACILITY:  [facility]  PHYSICIAN:  Luis Abed, MD, FACCDATE OF BIRTH:  Apr 07, 1941  DATE OF ADMISSION:  05/05/2011 DATE OF DISCHARGE:  05/07/2011                              DISCHARGE SUMMARY   PRIMARY CARE PHYSICIAN:  Doreen Beam, MD  PULMONOLOGIST:  Dr. Orson Aloe.  CARDIOLOGIST:  Lorine Bears, MD  REASON FOR ADMISSION:  Shortness of breath and dizziness.  DISCHARGE DIAGNOSES: 1. Shortness of breath and dizziness, likely felt to be related to     dehydration-resolved prior to discharge. 2. Belching-felt to be a side-effect of simvastatin (statins     discontinued for now). 3. Coronary artery disease status post recent drug-eluting stent     placement to the right coronary artery on October 2012. 4. Good left ventricular function. 5. Chronic obstructive pulmonary disease.6. Hypertension. 7. Right breast nodule on chest CT.  STUDY THIS ADMISSION:  Chest CT angiogram:  No pulmonary embolus, coronary artery disease and aortic atherosclerosis, severe centrilobular emphysema; nonspecific soft tissue nodule present in the right breast- correlation.  Mammography recommended if not recently performed.  HOSPITAL COURSE:  Carol Bradley is a 70 year old female patient with COPD, hypertension and CAD.  She presented to the emergency room with complaints of dizziness and shortness of breath and elevated heart rate. She was admitted for further evaluation and treatment.  Cardiac markers were negative ruling her out for myocardial infarction.  Given her symptoms, it was felt that she was likely dehydrated and hypovolemic. Her diuretic was discontinued, and she was hydrated with normal saline. She did undergo CT angiogram of her chest due to her presenting symptoms.  The results are outlined above.  She will need close followup with her primary care physician  for the right breast nodule.  She was noted to have marked belching during this admission.  Dr. Myrtis Ser saw the patient and felt that this was likely related to her simvastatin which was new a medication for her.  It is listed in the side effect profile. She was treated with simethicone, and her statin was discontinued.  Her symptoms improved off her statin.  She was evaluated by Dr. Myrtis Ser on the morning of May 07, 2011.  She felt much better and was felt stable enough for discharge to home.  LABORATORY AND ANCILLARY DATA:  Hemoglobin 11.9, potassium 4.3, creatinine 0.98.  Cardiac markers negative x2 as outlined.  Chest CT as noted above.  Chest x-ray on admission no acute findings, subsegmental atelectasis left lung base.  DISCHARGE MEDICATIONS: 1. Amlodipine 5 mg daily. 2. Aspirin 81 mg daily. 3. Clopidogrel 75 mg daily. 4. Nitroglycerin p.r.n. chest pain. 5. Spiriva daily.  The patient was advised to stop taking her triamterene/hydrochlorothiazide, as well as her simvastatin.  ALLERGIES:  No known drug allergies.  As noted above, she now seems to have an intolerance to STATINS.  ACTIVITY:  Increase activity slowly.  DIET:  Low-fat, low-sodium diet.  WOUND CARE:  Is not applicable.  FOLLOWUP:  Will be with Dr. Kirke Corin in the [facility] office on May 18, 2011,  at 1:45.  She should keep that appointment.  She has been advised to make an appointment with Dr. Sherril Croon in [place] to follow up on her right breast nodule and schedule mammogram.  Total physician PA time greater than 30 minutes since discharge.     Tereso Newcomer, PA-C   ______________________________ Luis Abed, MD, [facility]    SW/MEDQ  D:  05/07/2011  T:  05/07/2011  Job:  [id number]  cc:   Doreen Beam, MD  Electronically Signed by Tereso Newcomer PA-C on 05/13/2011 08:21:19 PM Electronically Signed by Willa Rough MD FACC on 05/20/2011 06:33:00 AM

## 2011-05-27 ENCOUNTER — Telehealth: Payer: Self-pay | Admitting: Physician Assistant

## 2011-05-27 NOTE — Telephone Encounter (Signed)
Patient called in with diffuse rash over entire body and lip swelling.  Started today.  Recently taken off simvastatin.  Started on Lipitor in last 2 weeks.  No other new meds.  Ate salmon last night.  Otherwise, no other fish.   I recommended she take some benadryl now and go to the ED for further treatment given her angioedema.   She should hold the lipitor until further notice.  Tereso Newcomer, PA-C  8:29 PM 05/27/2011

## 2011-05-29 ENCOUNTER — Telehealth: Payer: Self-pay | Admitting: Cardiovascular Disease

## 2011-05-29 NOTE — Telephone Encounter (Signed)
Patient was seen in ED over weekend for allergic reaction to statin drug. Patient didn't c/o of any swelling to lips/mouth but is still itching and have redness in areas of itching. Nurse explained to patient to stay off atorvastatin and take her prednisone and benadryl prescriptions that were given to her over the weekend, MD would be informed of symptoms. Nurse informed patient if she was SOB and had swelling to mouth or lips again, to return to ED. Patient verbalized understanding of plan.

## 2011-05-29 NOTE — Telephone Encounter (Signed)
Patient informed. 

## 2011-05-29 NOTE — Telephone Encounter (Signed)
She might be allergic to all statin drugs. Do not resume.

## 2011-06-05 ENCOUNTER — Telehealth: Payer: Self-pay | Admitting: Cardiovascular Disease

## 2011-06-05 NOTE — Telephone Encounter (Signed)
Left message for patient to call office.  

## 2011-06-06 NOTE — Telephone Encounter (Signed)
Spoke with patient and she said she is having a lot of bruising on her bottom and everywhere. Patient said she is bruising easily. Patient say's she is still itching everywhere. Yesterday morning patient woke up with rash in mouth and you saw a PA at her family doctor office. Patient also c/o waking up in the middle of the night with pressure in chest; then she has burping that relieves the pressure in her chest. Patient is seeing Dr. [place] on 06/16/11 for these problems but felt she needed and earlier appointment. Patient also said the PA told her she needed an earlier appointment too. Please advise.

## 2011-06-06 NOTE — Telephone Encounter (Signed)
Add her to my schedule this week on Thursday.

## 2011-06-07 NOTE — Telephone Encounter (Signed)
Left message on home number to call office, nurse was offering an appointment for tomorrow with Arida at 3:15pm. Also left message on son's voicemail r/e same information.

## 2011-06-08 ENCOUNTER — Encounter: Payer: Self-pay | Admitting: Cardiovascular Disease

## 2011-06-08 ENCOUNTER — Ambulatory Visit (INDEPENDENT_AMBULATORY_CARE_PROVIDER_SITE_OTHER): Payer: Medicare Other | Admitting: Cardiovascular Disease

## 2011-06-08 DIAGNOSIS — I2510 Atherosclerotic heart disease of native coronary artery without angina pectoris: Secondary | ICD-10-CM

## 2011-06-08 DIAGNOSIS — E785 Hyperlipidemia, unspecified: Secondary | ICD-10-CM

## 2011-06-08 DIAGNOSIS — R21 Rash and other nonspecific skin eruption: Secondary | ICD-10-CM | POA: Insufficient documentation

## 2011-06-08 MED ORDER — PRASUGREL HCL 10 MG PO TABS: 10.0000 mg | ORAL_TABLET | Freq: Every day | ORAL | Status: DC

## 2011-06-08 NOTE — Telephone Encounter (Signed)
Patient informed and confirmed that she will be here for appointment today.

## 2011-06-08 NOTE — Progress Notes (Signed)
HPI  This is a 70 year old female who is here today for a followup visit. She had a successful angioplasty and drug-eluting stent placement to the right coronary artery in October of this year. Unfortunately, since then she had multiple reactions mostly seem to be do to medications. She had side effects with simvastatin and later was switched to atorvastatin which caused her to have a rash and swelling in her lips. She was seen in the emergency room and was given Benadryl. The rash has improved. However, she still not feeling very well. She continues to have significant itching and slight residual rash. She had some blisters in her mouth about a week ago that resolved. She is having easy bruising. She did have CBC done by Dr. Sherril Croon this week which showed a slightly elevated WBC at 11.5 which was mostly neutrophils. Her platelet count was normal and hemoglobin was normal. She denies any chest discomfort she continues to have belching and gas feeling in her chest.  Allergies  Allergen Reactions  . Statins      Current Outpatient Prescriptions on File Prior to Visit  Medication Sig Dispense Refill  . amLODipine (NORVASC) 5 MG tablet Take 5 mg by mouth daily.        Marland Kitchen aspirin EC 81 MG tablet Take 81 mg by mouth daily.        . nitroGLYCERIN (NITROSTAT) 0.4 MG SL tablet Place 0.4 mg under the tongue every 5 (five) minutes as needed.        . tiotropium (SPIRIVA) 18 MCG inhalation capsule Place 18 mcg into inhaler and inhale daily.        Marland Kitchen triamterene-hydrochlorothiazide (MAXZIDE-25) 37.5-25 MG per tablet Take 0.5 tablets by mouth daily.           Past Medical History  Diagnosis Date  . COPD (chronic obstructive pulmonary disease)   . History of tobacco abuse   . CAD (coronary artery disease)   . Hypertension   . Hyperlipidemia      Past Surgical History  Procedure Date  . Cardiac catheterization 04/2011    right and left cath showing normal right heart pressures,but newly diagnosed  coronary artery disease/drug eluting stent placed to RCA with residual disease in the proximal RCA and LAD and ramus, normal LV function and 60-65% EF  . Coronary angioplasty with stent placement 04/2011    mid RCA: 3.0 X38 mm Promus DES. Residual 40% disease proximally     Family History  Problem Relation Age of Onset  . Heart failure Mother   . Cancer Father      History   Social History  . Marital Status: Married    Spouse Name: N/A    Number of Children: N/A  . Years of Education: N/A   Occupational History  . 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 Smoking status: Former Smoker -- 3.0 packs/day for 25 years    Types: Cigarettes    Quit date: 07/10/1994  . Smokeless tobacco: Never Used  . Alcohol Use: No  . Drug Use: Not on file  . Sexually Active: Not on file   Other Topics Concern  . Not on file   Social History Narrative  . No narrative on file     PHYSICAL EXAM   BP 131/74  Pulse 98  Ht 5\' 7"  (1.702 m)  Wt 166 lb (75.297 kg)  BMI 26.00 kg/m2  Constitutional: She is oriented to person, place,  and time. She appears well-developed and well-nourished. No distress.  HENT: No nasal discharge.  Head: Normocephalic and atraumatic.  Eyes: Pupils are equal, round, and reactive to light. Right eye exhibits no discharge. Left eye exhibits no discharge.  Neck: Normal range of motion. Neck supple. No JVD present. No thyromegaly present.  Cardiovascular: Normal rate, regular rhythm, normal heart sounds and intact distal pulses. Exam reveals no gallop and no friction rub.  No murmur heard.  Pulmonary/Chest: Effort normal and breath sounds normal. No stridor. No respiratory distress. She has no wheezes. She has no rales. She exhibits no tenderness.  Abdominal: Soft. Bowel sounds are normal. She exhibits no distension. There is no tenderness. There is no rebound and no guarding.  Musculoskeletal: Normal range of motion. She exhibits no edema  and no tenderness.  Neurological: She is alert and oriented to person, place, and time. Coordination normal.  Skin: Skin is warm and dry. No rash noted. She is not diaphoretic. No erythema. No pallor.  Psychiatric: She has a normal mood and affect. Her behavior is normal. Judgment and thought content normal.     ASSESSMENT AND PLAN

## 2011-06-08 NOTE — Assessment & Plan Note (Signed)
The patient did not tolerate statins due to side effects. Her LDL was 105. Goal LDL is less than 70. I will consider a non-statin treatment once she recovers from current reactions.

## 2011-06-08 NOTE — Patient Instructions (Signed)
   Stop Plavix  Begin Effient 10mg  daily  Call office when close to being out of samples &/or problems with medication  Follow up - see above

## 2011-06-08 NOTE — Assessment & Plan Note (Signed)
This seems to have improved overall but still has not resolved. She continues to have significant itching. It was initially thought that this might have been due to statins. The other possibility is Plavix. This is the only current medication that she was not on before her PCI. Thus, this will be stopped today and I will start her on Effient 10 mg once daily instead. She was given samples.

## 2011-06-08 NOTE — Assessment & Plan Note (Signed)
The patient is not having active symptoms of angina. Continue medical therapy.

## 2011-06-16 ENCOUNTER — Ambulatory Visit: Payer: Medicare Other | Admitting: Cardiology

## 2011-06-26 ENCOUNTER — Ambulatory Visit: Payer: Medicare Other | Admitting: Cardiovascular Disease

## 2011-07-05 ENCOUNTER — Telehealth: Payer: Self-pay | Admitting: *Deleted

## 2011-07-05 MED ORDER — PRASUGREL HCL 10 MG PO TABS: 10.0000 mg | ORAL_TABLET | Freq: Every day | ORAL | Status: DC

## 2011-07-05 NOTE — Telephone Encounter (Signed)
Pt states she was given samples of Effient to make sure she could tolerate it. She has tolerated it well other than some bruising. However, her insurance does not go into effect until January. She will be given samples to cover her until then.

## 2011-07-10 ENCOUNTER — Telehealth: Payer: Self-pay | Admitting: *Deleted

## 2011-07-10 NOTE — Telephone Encounter (Signed)
Pt walked into office concerned about bruising w/Effient. She states she has bruises all over her body. She doesn't have to hit herself hard to have a bruise. She states she was told at [facility] that her blood might be too thin and may need to be checked. Pt informed that Effient is not like Coumadin where blood levels need to be checked and dosages adjusted. BUN/CREAT were normal on last BMET drawn.  Bruises appear normal with several blackish/purple colored bruises to arms, legs, and abdomen.  Pt is aware to monitor bruising and notify of any further problems or change in appearance.

## 2011-07-11 NOTE — Telephone Encounter (Signed)
Ok. Thanks!

## 2011-07-12 NOTE — Telephone Encounter (Signed)
Pt notified and verbalized understanding.

## 2011-07-14 ENCOUNTER — Encounter: Payer: Self-pay | Admitting: *Deleted

## 2011-07-25 DIAGNOSIS — R079 Chest pain, unspecified: Secondary | ICD-10-CM

## 2011-07-26 ENCOUNTER — Encounter: Payer: Self-pay | Admitting: *Deleted

## 2011-07-26 ENCOUNTER — Telehealth: Payer: Self-pay | Admitting: *Deleted

## 2011-07-26 NOTE — Telephone Encounter (Signed)
Message copied by Arlyss Gandy on Wed Jul 26, 2011  8:24 AM ------      Message from: Lorine Bears A      Created: Tue Jul 25, 2011  4:28 PM       Her cholesterol is high. Will talk abut treatment during her follow up.

## 2011-07-26 NOTE — Telephone Encounter (Signed)
Pt notified of results and verbalized understanding  

## 2011-08-11 DIAGNOSIS — R079 Chest pain, unspecified: Secondary | ICD-10-CM

## 2011-08-18 ENCOUNTER — Encounter: Payer: Self-pay | Admitting: Cardiovascular Disease

## 2011-08-18 ENCOUNTER — Ambulatory Visit (INDEPENDENT_AMBULATORY_CARE_PROVIDER_SITE_OTHER): Payer: Medicare Other | Admitting: Cardiovascular Disease

## 2011-08-18 VITALS — BP 117/71 | HR 85 | Ht 67.0 in | Wt 162.0 lb

## 2011-08-18 DIAGNOSIS — E785 Hyperlipidemia, unspecified: Secondary | ICD-10-CM

## 2011-08-18 DIAGNOSIS — I2510 Atherosclerotic heart disease of native coronary artery without angina pectoris: Secondary | ICD-10-CM

## 2011-08-18 DIAGNOSIS — I10 Essential (primary) hypertension: Secondary | ICD-10-CM

## 2011-08-18 MED ORDER — DILTIAZEM HCL ER COATED BEADS 240 MG PO CP24: 240.0000 mg | ORAL_CAPSULE | Freq: Every day | ORAL | Status: DC

## 2011-08-18 NOTE — Assessment & Plan Note (Addendum)
The patient had a recent brief hospitalization for palpitations and dyspnea. Her nuclear stress test did not show significant ischemia and ejection fraction was normal. I recommend continuing medical therapy. She is on aspirin and Effient. Plavix Cozaar rash. I suspect that some of her dyspnea is a noncardiac and likely related to her lung condition. She has a followup appointment with Dr. Orson Aloe. If she continues to have unexplained episodes of dyspnea, a re-look cardiac catheterization can be considered for a definitive diagnosis.

## 2011-08-18 NOTE — Progress Notes (Signed)
HPI  Carol Bradley is a 71 year old female who is here today for a followup visit. She has known history of coronary artery disease status post angioplasty and drug-eluting stent placement to the right coronary artery. She was hospitalized last week for one day at [facility] due to dyspnea and palpitations. She was noted to be slightly tachycardic. Her d-dimer was slightly elevated. She had CTA done which showed no evidence of pulmonary embolism there was evidence of emphysema. She had an echocardiogram done which showed normal LV systolic function with mild aortic and tricuspid regurgitation. There was only mild pulmonary hypertension. She ruled out for myocardial infarction and had a nuclear stress test which showed a very small anteroseptal reversible defect likely due to shifting breast attenuation with normal ejection fraction. The inferior wall was completely normal and that's where she had her PCI. I doubled the dose of diltiazem and stop her diuretic. Overall, she feels better. She had no recurrent dyspnea. She is back to cardiac rehabilitation.  Allergies  Allergen Reactions  . Plavix (Clopidogrel Bisulfate) Swelling  . Statins Hives    Swelling in face     Current Outpatient Prescriptions on File Prior to Visit  Medication Sig Dispense Refill  . aspirin EC 81 MG tablet Take 81 mg by mouth daily.        . nitroGLYCERIN (NITROSTAT) 0.4 MG SL tablet Place 0.4 mg under the tongue every 5 (five) minutes as needed.        . prasugrel (EFFIENT) 10 MG TABS Take 1 tablet (10 mg total) by mouth daily.  30 tablet  6  . tiotropium (SPIRIVA) 18 MCG inhalation capsule Place 18 mcg into inhaler and inhale daily.           Past Medical History  Diagnosis Date  . COPD (chronic obstructive pulmonary disease)   . History of tobacco abuse   . CAD (coronary artery disease)   . Hypertension   . Hyperlipidemia      Past Surgical History  Procedure Date  . Cardiac catheterization 04/2011   right and left cath showing normal right heart pressures,but newly diagnosed coronary artery disease/drug eluting stent placed to RCA with residual disease in the proximal RCA and LAD and ramus, normal LV function and 60-65% EF  . Coronary angioplasty with stent placement 04/2011    mid RCA: 3.0 X38 mm Promus DES. Residual 40% disease proximally     Family History  Problem Relation Age of Onset  . Heart failure Mother   . Cancer Father      History   Social History  . Marital Status: Married    Spouse Bradley: N/A    Number of Children: N/A  . Years of Education: N/A   Occupational History  . 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 Smoking status: Former Smoker -- 3.0 packs/day for 25 years    Types: Cigarettes    Quit date: 07/10/1994  . Smokeless tobacco: Never Used  . Alcohol Use: No  . Drug Use: Not on file  . Sexually Active: Not on file   Other Topics Concern  . Not on file   Social History Narrative  . No narrative on file     PHYSICAL EXAM   BP 117/71  Pulse 85  Ht 5\' 7"  (1.702 m)  Wt 162 lb (73.483 kg)  BMI 25.37 kg/m2 Constitutional: She is oriented to person, place, and time. She appears well-developed and well-nourished.  No distress.  HENT: No nasal discharge.  Head: Normocephalic and atraumatic.  Eyes: Pupils are equal and round. Right eye exhibits no discharge. Left eye exhibits no discharge.  Neck: Normal range of motion. Neck supple. No JVD present. No thyromegaly present.  Cardiovascular: Normal rate, regular rhythm, normal heart sounds. Exam reveals no gallop and no friction rub. No murmur heard.  Pulmonary/Chest: Effort normal and breath sounds normal. No stridor. No respiratory distress. She has no wheezes. She has no rales. She exhibits no tenderness.  Abdominal: Soft. Bowel sounds are normal. She exhibits no distension. There is no tenderness. There is no rebound and no guarding.  Musculoskeletal: Normal range of  motion. She exhibits no edema and no tenderness.  Neurological: She is alert and oriented to person, place, and time. Coordination normal.  Skin: Skin is warm and dry. No rash noted. She is not diaphoretic. No erythema. No pallor.  Psychiatric: She has a normal mood and affect. Her behavior is normal. Judgment and thought content normal.      ASSESSMENT AND PLAN

## 2011-08-18 NOTE — Assessment & Plan Note (Signed)
The patient is unfortunately allergic to statins.

## 2011-08-18 NOTE — Assessment & Plan Note (Signed)
I increased the dose of diltiazem recently due to palpitations. She is currently taking it twice a day. I will switch her to diltiazem extended release 240 mg once daily. Stop potassium chloride as she is not on a diuretic anymore. I asked her to monitor for symptoms of fluid overload.

## 2011-08-18 NOTE — Patient Instructions (Signed)
Your physician wants you to follow-up in: 3 months. You will receive a reminder letter in the mail one-two months in advance. If you don't receive a letter, please call our office to schedule the follow-up appointment. Increase Cardizem (diltiazem) to 240 mg daily. Stop Potassium (K-dur)

## 2011-11-21 ENCOUNTER — Encounter: Payer: Self-pay | Admitting: Cardiology

## 2011-11-21 ENCOUNTER — Ambulatory Visit (INDEPENDENT_AMBULATORY_CARE_PROVIDER_SITE_OTHER): Payer: Medicare Other | Admitting: Cardiology

## 2011-11-21 VITALS — BP 144/82 | HR 77 | Resp 18 | Ht 67.0 in | Wt 154.0 lb

## 2011-11-21 DIAGNOSIS — Z79899 Other long term (current) drug therapy: Secondary | ICD-10-CM

## 2011-11-21 DIAGNOSIS — E785 Hyperlipidemia, unspecified: Secondary | ICD-10-CM

## 2011-11-21 DIAGNOSIS — I10 Essential (primary) hypertension: Secondary | ICD-10-CM

## 2011-11-21 DIAGNOSIS — I2510 Atherosclerotic heart disease of native coronary artery without angina pectoris: Secondary | ICD-10-CM

## 2011-11-21 MED ORDER — SPIRONOLACTONE-HCTZ 25-25 MG PO TABS: 1.0000 | ORAL_TABLET | Freq: Every day | ORAL | Status: DC

## 2011-11-21 NOTE — Assessment & Plan Note (Addendum)
Patient reports an allergy to all statins.

## 2011-11-21 NOTE — Patient Instructions (Addendum)
   Begin Aldactazide 25/25mg  daily Your physician recommends that you go to the [facility] lab for blood work for [employer] on day 3 & day 7.   If the results of your test are normal or stable, you will receive a letter.  If they are abnormal, the nurse will contact you by phone. Your physician wants you to follow up in: 6 months.  You will receive a reminder letter in the mail one-two months in advance.  If you don't receive a letter, please call our office to schedule the follow up appointment

## 2011-11-21 NOTE — Progress Notes (Signed)
Peyton Bottoms, MD, [facility] Board Certified in Adult Cardiovascular Medicine,Internal Medicine and Critical Care Medicine    CC: Followup patient with coronary artery disease.  HPI:  The patient is a 71 year old female who was initially referred for CPX testing because of concern of pulmonary hypertension. She had a left and right heart catheterization which showed normal pressures have significant disease of the RCA and underwent stent placement. She has had no further chest pain or shortness of breath orthopnea PND. Patient denies any palpitations presyncope or syncope. The patient is unable to take statin drug therapy. She reports significant bruising with multiple ecchymotic areas on low-dose aspirin and prasugrel. She has had no gross bleeding however. Her blood pressure has been poorly controlled and she is also hypertensive in the office today. Otherwise from a cardiac perspective the patient feels quite well.  PMH: reviewed and listed in Problem List in Electronic Records (and see below) Past Medical History  Diagnosis Date  . COPD (chronic obstructive pulmonary disease)   . History of tobacco abuse   . CAD (coronary artery disease)   . Hypertension   . Hyperlipidemia    Past Surgical History  Procedure Date  . Cardiac catheterization 04/2011    right and left cath showing normal right heart pressures,but newly diagnosed coronary artery disease/drug eluting stent placed to RCA with residual disease in the proximal RCA and LAD and ramus, normal LV function and 60-65% EF  . Coronary angioplasty with stent placement 04/2011    mid RCA: 3.0 X38 mm Promus DES. Residual 40% disease proximally    Allergies/SH/FHX : available in Electronic Records for review  Allergies  Allergen Reactions  . Plavix (Clopidogrel Bisulfate) Swelling  . Statins Hives    Swelling in face   History   Social History  . Marital Status: Married    Spouse Name: N/A    Number of Children: N/A  .  Years of Education: N/A   Occupational History  . 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 Smoking status: Former Smoker -- 3.0 packs/day for 25 years    Types: Cigarettes    Quit date: 07/10/1994  . Smokeless tobacco: Never Used  . Alcohol Use: No  . Drug Use: Not on file  . Sexually Active: Not on file   Other Topics Concern  . Not on file   Social History Narrative  . No narrative on file   Family History  Problem Relation Age of Onset  . Heart failure Mother   . Cancer Father     Medications: Current Outpatient Prescriptions  Medication Sig Dispense Refill  . aspirin EC 81 MG tablet Take 81 mg by mouth daily.        Marland Kitchen diltiazem (CARTIA XT) 240 MG 24 hr capsule Take 1 capsule (240 mg total) by mouth daily.  30 capsule  6  . nitroGLYCERIN (NITROSTAT) 0.4 MG SL tablet Place 0.4 mg under the tongue every 5 (five) minutes as needed.        . prasugrel (EFFIENT) 10 MG TABS Take 1 tablet (10 mg total) by mouth daily.  30 tablet  6  . tiotropium (SPIRIVA) 18 MCG inhalation capsule Place 18 mcg into inhaler and inhale daily.        Marland Kitchen spironolactone-hydrochlorothiazide (ALDACTAZIDE) 25-25 MG per tablet Take 1 tablet by mouth daily.  30 tablet  6    ROS: No nausea or vomiting. No fever or chills.No  melena or hematochezia.No bleeding.No claudication  Physical Exam: BP 144/82  Pulse 77  Resp 18  Ht 5\' 7"  (1.702 m)  Wt 154 lb (69.854 kg)  BMI 24.12 kg/m2 General: Well-nourished white female in no distress. Neck: Normal carotid upstroke no carotid bruits. No thyromegaly. Nonnodular thyroid. JVP is 5-6 cm. Lungs: Clear breath sounds bilaterally without wheezing. Cardiac: Regular rate and rhythm with normal S1-S2 no pathological murmurs. Vascular: No edema. Normal distal pulses bilaterally Skin: Warm and dry Physcologic: Normal affect  12lead ECG: Not performed Limited bedside ECHO:N/A No images are attached to the encounter.   Assessment and  Plan  CAD (coronary artery disease) Patient reports no recurrent chest pain. She remains on dual antiplatelet therapy but has significant bruising. We will confirm with either Dr. Excell Seltzer or Dr. Kirke Corin if the patient can stop low-dose aspirin. Patient can continue on her current medical regimen.  Hypertension Blood pressure is poorly controlled patient is currently not on a diuretic. In the past she had a low potassium. I added Aldactazide 25/25 mg by mouth daily. We will check a potassium level on day #3 and day #7.  Hyperlipidemia Patient reports an allergy to all statins.    Patient Active Problem List  Diagnoses  . CAD (coronary artery disease)  . Hypertension  . Hyperlipidemia  . Dyspepsia

## 2011-11-21 NOTE — Assessment & Plan Note (Signed)
Blood pressure is poorly controlled patient is currently not on a diuretic. In the past she had a low potassium. I added Aldactazide 25/25 mg by mouth daily. We will check a potassium level on day #3 and day #7.

## 2011-11-21 NOTE — Assessment & Plan Note (Signed)
Patient reports no recurrent chest pain. She remains on dual antiplatelet therapy but has significant bruising. We will confirm with either Dr. Excell Seltzer or Dr. Kirke Corin if the patient can stop low-dose aspirin. Patient can continue on her current medical regimen.

## 2011-12-22 ENCOUNTER — Telehealth: Payer: Self-pay | Admitting: *Deleted

## 2011-12-22 NOTE — Telephone Encounter (Signed)
Left message for patient to call office.  

## 2011-12-22 NOTE — Telephone Encounter (Signed)
Message copied by Eustace Moore on Fri Dec 22, 2011 10:42 AM ------      Message from: Lewayne Bunting E      Created: Wed Nov 29, 2011  5:18 AM       LDL 127 and has CAD could benefit from STATIN- but listed as allergy. Has she tried Crestor low dose also? If she cannot tolerate any statin then recommend exercise and low carbohydrate diet.

## 2011-12-28 MED ORDER — ROSUVASTATIN CALCIUM 5 MG PO TABS: 5.0000 mg | ORAL_TABLET | Freq: Every day | ORAL | Status: DC

## 2011-12-28 NOTE — Telephone Encounter (Signed)
Patient informed and say's she hasn't tried crestor before. Patient informed that low dose crestor would be sent to [facility] and if she has problems, to call our office and let us know. Nurse called [facility] and verified that patient never filled crestor there before. Coupon for first 30 day supply free faxed to [facility] Patient verbalized understanding of plan.

## 2012-01-03 ENCOUNTER — Ambulatory Visit: Payer: Medicare Other | Admitting: Internal Medicine

## 2012-02-29 ENCOUNTER — Other Ambulatory Visit: Payer: Self-pay | Admitting: Cardiovascular Disease

## 2012-03-18 ENCOUNTER — Other Ambulatory Visit: Payer: Self-pay | Admitting: Cardiovascular Disease

## 2012-04-12 ENCOUNTER — Encounter: Payer: Self-pay | Admitting: Cardiology

## 2012-04-12 ENCOUNTER — Ambulatory Visit (INDEPENDENT_AMBULATORY_CARE_PROVIDER_SITE_OTHER): Payer: Medicare Other | Admitting: Cardiology

## 2012-04-12 VITALS — BP 130/66 | HR 92 | Ht 67.0 in | Wt 151.4 lb

## 2012-04-12 DIAGNOSIS — I2510 Atherosclerotic heart disease of native coronary artery without angina pectoris: Secondary | ICD-10-CM

## 2012-04-12 DIAGNOSIS — I10 Essential (primary) hypertension: Secondary | ICD-10-CM

## 2012-04-12 DIAGNOSIS — E785 Hyperlipidemia, unspecified: Secondary | ICD-10-CM

## 2012-04-12 NOTE — Progress Notes (Signed)
Ronnette Hila Muto Date of Birth: October 12, 1940 Medical Record #[id number]  History of Present Illness: Mrs. Carol Bradley is seen to establish cardiac care. She is a pleasant 71 year old white female. She has a remote history of tobacco abuse. She has a history of COPD and mild pulmonary hypertension. Over one year ago she developed symptoms of increased dyspnea. She reports that she had a Myoview study in February 2012 which was unremarkable. Because of symptoms of worsening dyspnea she did undergo cardiac catheterization. This did demonstrate mild pulmonary hypertension. She also had a high-grade stenosis in the mid to distal RCA. This was stented using a 3.0 x 28 mm Promus stent. Following the stent procedure her dyspnea improved dramatically. She still notes some shortness of breath when she is walking up stairs. She denies any chest pain.  Current Outpatient Prescriptions on File Prior to Visit  Medication Sig Dispense Refill  . aspirin EC 81 MG tablet Take 81 mg by mouth daily.        Marland Kitchen diltiazem (TIAZAC) 240 MG 24 hr capsule TAKE ONE CAPSULE ONCE DAILY  30 capsule  2  . nitroGLYCERIN (NITROSTAT) 0.4 MG SL tablet Place 0.4 mg under the tongue every 5 (five) minutes as needed.        . rosuvastatin (CRESTOR) 5 MG tablet Take 1 tablet (5 mg total) by mouth daily.  30 tablet  6  . tiotropium (SPIRIVA) 18 MCG inhalation capsule Place 18 mcg into inhaler and inhale daily.        Marland Kitchen DISCONTD: diltiazem (CARTIA XT) 240 MG 24 hr capsule Take 1 capsule (240 mg total) by mouth daily.  30 capsule  6    Allergies  Allergen Reactions  . Plavix (Clopidogrel Bisulfate) Swelling  . Statins Hives    Swelling in face    Past Medical History  Diagnosis Date  . COPD (chronic obstructive pulmonary disease)   . History of tobacco abuse   . CAD (coronary artery disease)   . Hypertension   . Hyperlipidemia     Past Surgical History  Procedure Date  . Cardiac catheterization 04/2011    right and left cath showing  normal right heart pressures,but newly diagnosed coronary artery disease/drug eluting stent placed to RCA with residual disease in the proximal RCA and LAD and ramus, normal LV function and 60-65% EF  . Coronary angioplasty with stent placement 04/2011    mid RCA: 3.0 X38 mm Promus DES. Residual 40% disease proximally    History  Smoking status  . Former Smoker -- 3.0 packs/day for 25 years  . Types: Cigarettes  . Quit date: 07/10/1994  Smokeless tobacco  . Never Used    History  Alcohol Use No    Family History  Problem Relation Age of Onset  . Heart failure Mother   . Cancer Father     Review of Systems: The review of systems is positive for history of allergic reaction to medication in the past. It was unclear whether this was statin-related or related to Plavix. She has been tolerating Crestor well.  All other systems were reviewed and are negative.  Physical Exam: BP 130/66  Pulse 92  Ht 5\' 7"  (1.702 m)  Wt 151 lb 6.4 oz (68.675 kg)  BMI 23.71 kg/m2  SpO2 96% She is a pleasant white female in no acute distress.The patient is alert and oriented x 3.  The mood and affect are normal.  The skin is warm and dry.  Color is normal.  The  HEENT exam reveals that the sclera are nonicteric.  The mucous membranes are moist.  The carotids are 2+ without bruits.  There is no thyromegaly.  There is no JVD.  The lungs are clear.  The chest wall is non tender.  The heart exam reveals a regular rate with a normal S1 and S2.  There are no murmurs, gallops, or rubs.  The PMI is not displaced.   Abdominal exam reveals good bowel sounds.  There is no guarding or rebound.  There is no hepatosplenomegaly or tenderness.  There are no masses.  Exam of the legs reveal no clubbing, cyanosis, or edema.  The legs are without rashes.  The distal pulses are intact.  Cranial nerves II - XII are intact.  Motor and sensory functions are intact.  The gait is normal.  LABORATORY DATA:   Assessment / Plan: 1.  Coronary disease status post stenting of the RCA in October of 2012 with a drug-eluting stent. She may stop Effient at this time. We will continue aspirin chronically. Continue diltiazem. I'll followup again in 6 months.  2. Hypercholesterolemia. She has been on low-dose Crestor for 3 months. We'll followup fasting lab work including chemistries and lipid panel.  3. COPD with remote history of tobacco use.  4. Hypertension, controlled on diltiazem alone.

## 2012-04-12 NOTE — Patient Instructions (Signed)
Stop taking Effient.  Continue your other medications  We need to get fasting lab work.    I will see you in 6 months.

## 2012-04-15 ENCOUNTER — Encounter: Payer: Self-pay | Admitting: Cardiology

## 2012-04-22 ENCOUNTER — Telehealth: Payer: Self-pay | Admitting: Cardiology

## 2012-04-22 NOTE — Telephone Encounter (Signed)
Patient called stated she had lab work done at [facility] in [place] last week.Patient was told we did not receive lab work.[facility]  called will fax labs.Patient was told will show Dr.Jordan tomorrow 04/23/12 and call her back.Also patient needed samples of crestor.Crestor 5 mg samples left at 3rd floor front desk.

## 2012-04-22 NOTE — Telephone Encounter (Signed)
Pt was outside please call her again she is sorry she missed your call

## 2012-04-22 NOTE — Telephone Encounter (Signed)
Patient called no answer.LMTC. 

## 2012-04-22 NOTE — Telephone Encounter (Signed)
New Problem:    Patient called in wanting to know the results of the latest lab work.  Please call back.

## 2012-04-25 NOTE — Telephone Encounter (Signed)
Patient came by office was told received 04/15/12 normal lab work from [facility].

## 2012-05-24 ENCOUNTER — Telehealth: Payer: Self-pay

## 2012-05-24 NOTE — Telephone Encounter (Signed)
Patient was given crestor 10 mg samples take 1/2 tablet.

## 2012-06-13 ENCOUNTER — Other Ambulatory Visit: Payer: Self-pay | Admitting: Physician Assistant

## 2012-06-19 ENCOUNTER — Other Ambulatory Visit: Payer: Self-pay | Admitting: Cardiology

## 2012-06-19 MED ORDER — DILTIAZEM HCL ER 240 MG PO CP24: 240.0000 mg | ORAL_CAPSULE | Freq: Every day | ORAL | Status: DC

## 2012-08-14 ENCOUNTER — Encounter: Payer: Self-pay | Admitting: Cardiology

## 2012-09-03 ENCOUNTER — Ambulatory Visit (INDEPENDENT_AMBULATORY_CARE_PROVIDER_SITE_OTHER): Payer: Medicare Other | Admitting: Cardiology

## 2012-09-03 ENCOUNTER — Encounter: Payer: Self-pay | Admitting: Cardiology

## 2012-09-03 VITALS — BP 138/70 | HR 83 | Ht 68.0 in | Wt 158.0 lb

## 2012-09-03 DIAGNOSIS — I2510 Atherosclerotic heart disease of native coronary artery without angina pectoris: Secondary | ICD-10-CM

## 2012-09-03 DIAGNOSIS — E785 Hyperlipidemia, unspecified: Secondary | ICD-10-CM

## 2012-09-03 DIAGNOSIS — I10 Essential (primary) hypertension: Secondary | ICD-10-CM

## 2012-09-03 MED ORDER — LOSARTAN POTASSIUM-HCTZ 100-25 MG PO TABS: 1.0000 | ORAL_TABLET | Freq: Every day | ORAL | Status: DC

## 2012-09-03 NOTE — Patient Instructions (Signed)
Stop lisinopril.  Start losartan HCT 100/25 mg daily.  We will check your kidney function and potassium in 2-3 weeks and see Dr. Sherril Croon.  Keep track of your blood pressure  I will see you in 6 months.

## 2012-09-03 NOTE — Progress Notes (Signed)
Carol Bradley Date of Birth: 11-01-1940 Medical Record #[id number]  History of Present Illness: Carol Bradley is seen for follow up. She has a history of COPD and mild pulmonary hypertension. She reports that she had a Myoview study in February 2012 which was unremarkable. Because of symptoms of worsening dyspnea she did undergo cardiac catheterization in October 2012. This did demonstrate mild pulmonary hypertension. She also had a high-grade stenosis in the mid to distal RCA. This was stented using a 3.0 x 28 mm Promus stent. Following the stent procedure her dyspnea improved dramatically. On follow up today she denies any recurrent chest pain or dyspnea. Earlier this month she complained of symptoms of dizzyness, lightheadedness, and feeling "yucky". Her BP was severely elevated at 195/84. She went to the ED at [facility]. D-dimer was mildly elevated. CT of the chest was negative for PE. Cardiac enzymes were normal. Creatinine was .88.Cranial CT was normal. Ecg showed nonspecific ST changes. She was started on lisinopril at 10 mg daily. This was later increased to 20 mg. She now complains of a dry hacking cough. BP has improved but is still elevated at 139-168 systolic.  Current Outpatient Prescriptions on File Prior to Visit  Medication Sig Dispense Refill  . aspirin EC 81 MG tablet Take 81 mg by mouth daily.        Marland Kitchen diltiazem (DILACOR XR) 240 MG 24 hr capsule Take 1 capsule (240 mg total) by mouth daily.  30 capsule  11  . nitroGLYCERIN (NITROSTAT) 0.4 MG SL tablet Place 0.4 mg under the tongue every 5 (five) minutes as needed.        . rosuvastatin (CRESTOR) 5 MG tablet Take 1 tablet (5 mg total) by mouth daily.  30 tablet  6  . tiotropium (SPIRIVA) 18 MCG inhalation capsule Place 18 mcg into inhaler and inhale daily.        . [DISCONTINUED] diltiazem (CARTIA XT) 240 MG 24 hr capsule Take 1 capsule (240 mg total) by mouth daily.  30 capsule  6   No current facility-administered  medications on file prior to visit.    Allergies  Allergen Reactions  . Plavix (Clopidogrel Bisulfate) Swelling  . Statins Hives    Swelling in face    Past Medical History  Diagnosis Date  . COPD (chronic obstructive pulmonary disease)   . History of tobacco abuse   . CAD (coronary artery disease)   . Hypertension   . Hyperlipidemia     Past Surgical History  Procedure Laterality Date  . Cardiac catheterization  04/2011    right and left cath showing normal right heart pressures,but newly diagnosed coronary artery disease/drug eluting stent placed to RCA with residual disease in the proximal RCA and LAD and ramus, normal LV function and 60-65% EF  . Coronary angioplasty with stent placement  04/2011    mid RCA: 3.0 X38 mm Promus DES. Residual 40% disease proximally    History  Smoking status  . Former Smoker -- 3.00 packs/day for 25 years  . Types: Cigarettes  . Quit date: 07/10/1994  Smokeless tobacco  . Never Used    History  Alcohol Use No    Family History  Problem Relation Age of Onset  . Heart failure Mother   . Cancer Father     Review of Systems: As noted in HPI. All other systems were reviewed and are negative.  Physical Exam: BP 138/70  Pulse 83  Ht 5\' 8"  (1.727 m)  Wt 158  lb (71.668 kg)  BMI 24.03 kg/m2  SpO2 97% She is a pleasant white female in no acute distress.The patient is alert and oriented x 3.  The mood and affect are normal.  The skin is warm and dry.  Color is normal.  The HEENT exam is normal. The carotids are 2+ without bruits.  There is no thyromegaly.  There is no JVD.  The lungs are clear.   The heart exam reveals a regular rate with a normal S1 and S2.  There are no murmurs, gallops, or rubs.  The PMI is not displaced.   Abdominal exam reveals good bowel sounds.  There is no guarding or rebound.  There is no hepatosplenomegaly or tenderness.  There are no masses.  Exam of the legs reveal no clubbing, cyanosis, or edema.  The legs  are without rashes.  The distal pulses are intact.  Cranial nerves II - XII are intact.  Motor and sensory functions are intact.  The gait is normal.  LABORATORY DATA: As noted above.  Assessment / Plan: 1. Coronary disease status post stenting of the RCA in October of 2012 with a drug-eluting stent.  We will continue aspirin chronically. Continue diltiazem. I'll followup again in 6 months.  2. Hypercholesterolemia. On Crestor.  3. COPD with remote history of tobacco use.  4. Hypertension recent acceleration. Now with cough on lisinopril. Will switch to losartan HCT 100/25 mg daily in addition to diltiazem. Follow up BMET in 2-3 weeks and repeat BP check with Dr. Sherril Croon. If BP remains difficult to control consider renal duplex.

## 2012-09-11 ENCOUNTER — Encounter: Payer: Self-pay | Admitting: Cardiology

## 2012-09-12 ENCOUNTER — Encounter: Payer: Self-pay | Admitting: *Deleted

## 2012-09-16 ENCOUNTER — Encounter: Payer: Self-pay | Admitting: Cardiology

## 2012-09-20 DIAGNOSIS — R079 Chest pain, unspecified: Secondary | ICD-10-CM

## 2012-09-21 ENCOUNTER — Encounter: Payer: Self-pay | Admitting: Cardiology

## 2012-09-23 NOTE — Telephone Encounter (Signed)
Patient called no answer.Left message to call.

## 2012-09-24 ENCOUNTER — Other Ambulatory Visit: Payer: Medicare Other

## 2012-09-27 NOTE — Telephone Encounter (Signed)
Spoke to patient 09/23/12 she wanted to check with Dr.Jordan about continuing to have sob with exertion, and to report she went to [facility] ER 09/20/12.Appointment scheduled with Dr.Jordan 10/09/12.Advised to continue same medications,will obtain records from [facility]. Patient was told Dr.Jordan out of office will let him know when he returns.Patient called 09/27/12 no answer, unable to leave a message.Spoke with Dr.Jordan he advised to continue same medication and keep appointment 10/09/12.

## 2012-10-02 ENCOUNTER — Encounter: Payer: Self-pay | Admitting: Cardiology

## 2012-10-09 ENCOUNTER — Encounter: Payer: Self-pay | Admitting: Cardiology

## 2012-10-09 ENCOUNTER — Ambulatory Visit (INDEPENDENT_AMBULATORY_CARE_PROVIDER_SITE_OTHER): Payer: Medicare Other | Admitting: Cardiology

## 2012-10-09 ENCOUNTER — Telehealth: Payer: Self-pay | Admitting: Cardiology

## 2012-10-09 VITALS — BP 140/72 | HR 96 | Ht 68.0 in | Wt 151.1 lb

## 2012-10-09 DIAGNOSIS — E785 Hyperlipidemia, unspecified: Secondary | ICD-10-CM

## 2012-10-09 DIAGNOSIS — I2510 Atherosclerotic heart disease of native coronary artery without angina pectoris: Secondary | ICD-10-CM

## 2012-10-09 DIAGNOSIS — I10 Essential (primary) hypertension: Secondary | ICD-10-CM

## 2012-10-09 MED ORDER — ATORVASTATIN CALCIUM 10 MG PO TABS: 10.0000 mg | ORAL_TABLET | Freq: Every day | ORAL | Status: DC

## 2012-10-09 MED ORDER — LOSARTAN POTASSIUM 100 MG PO TABS: 100.0000 mg | ORAL_TABLET | Freq: Every day | ORAL | Status: DC

## 2012-10-09 MED ORDER — METOPROLOL TARTRATE 25 MG PO TABS: 25.0000 mg | ORAL_TABLET | Freq: Two times a day (BID) | ORAL | Status: DC

## 2012-10-09 NOTE — Patient Instructions (Signed)
Stop losartan HCT. Start losartan alone 100 mg daily.  Start metoprolol 25 mg twice a day.  Start atorvastatin 10 mg daily for your cholesterol.  We will get your records from [facility].

## 2012-10-09 NOTE — Telephone Encounter (Signed)
New Prob    Have question regarding losartan. Would like to speak to nurse.

## 2012-10-09 NOTE — Progress Notes (Signed)
Carol Bradley Date of Birth: [birth year] Medical Record #[id number]  History of Present Illness: Carol Bradley is seen for follow up. She has a history of COPD and mild pulmonary hypertension. She also is a history of coronary disease and is status post stenting of the mid to distal RCA in October 2012. Cardiac catheterization at that time also demonstrated mild pulmonary hypertension. Following stenting of the right coronary her dyspnea improved significantly. Since then she has had difficulty with labile hypertension. She was seen in the emergency department in February. CT of the chest was negative. Cranial CT was also negative. She was placed on lisinopril but this caused a significant cough. When I last saw her we switched her to losartan HCT. Her cough has resolved. She has developed hyponatremia with sodium down to 129. She was again seen in the emergency department this past month with blood pressure of 202 systolic. She is admitted overnight and apparently had a nuclear stress test. She had an abdominal ultrasound is well. Those results are unknown. She brings extensive blood pressure readings over the past month. In general her blood pressure readings have been more favorable but she does still have significant spikes in her blood pressure readings.  Current Outpatient Prescriptions on File Prior to Visit  Medication Sig Dispense Refill  . aspirin EC 81 MG tablet Take 81 mg by mouth daily.        Marland Kitchen diltiazem (DILACOR XR) 240 MG 24 hr capsule Take 1 capsule (240 mg total) by mouth daily.  30 capsule  11  . nitroGLYCERIN (NITROSTAT) 0.4 MG SL tablet Place 0.4 mg under the tongue every 5 (five) minutes as needed.        . tiotropium (SPIRIVA) 18 MCG inhalation capsule Place 18 mcg into inhaler and inhale daily.        . [DISCONTINUED] diltiazem (CARTIA XT) 240 MG 24 hr capsule Take 1 capsule (240 mg total) by mouth daily.  30 capsule  6   No current facility-administered medications on file prior  to visit.    Allergies  Allergen Reactions  . Lisinopril Cough  . Plavix (Clopidogrel Bisulfate) Swelling    Past Medical History  Diagnosis Date  . COPD (chronic obstructive pulmonary disease)   . History of tobacco abuse   . CAD (coronary artery disease)   . Hypertension   . Hyperlipidemia     Past Surgical History  Procedure Laterality Date  . Cardiac catheterization  04/2011    right and left cath showing normal right heart pressures,but newly diagnosed coronary artery disease/drug eluting stent placed to RCA with residual disease in the proximal RCA and LAD and ramus, normal LV function and 60-65% EF  . Coronary angioplasty with stent placement  04/2011    mid RCA: 3.0 X38 mm Promus DES. Residual 40% disease proximally    History  Smoking status  . Former Smoker -- 3.00 packs/day for 25 years  . Types: Cigarettes  . Quit date: 07/10/1994  Smokeless tobacco  . Never Used    History  Alcohol Use No    Family History  Problem Relation Age of Onset  . Heart failure Mother   . Cancer Father     Review of Systems: As noted in HPI. All other systems were reviewed and are negative.  Physical Exam: BP 140/72  Pulse 96  Ht 5\' 8"  (1.727 m)  Wt 151 lb 1.9 oz (68.548 kg)  BMI 22.98 kg/m2  SpO2 96% She is a pleasant  white female in no acute distress.The patient is alert and oriented x 3.  The mood and affect are normal.  The skin is warm and dry.  Color is normal.  The HEENT exam is normal. The carotids are 2+ without bruits.  There is no thyromegaly.  There is no JVD.  The lungs are clear.   The heart exam reveals a regular rate with a normal S1 and S2.  There are no murmurs, gallops, or rubs.  The PMI is not displaced.   Abdominal exam reveals good bowel sounds.  There is no guarding or rebound.  There is no hepatosplenomegaly or tenderness.  There are no masses.  Exam of the legs reveal no clubbing, cyanosis, or edema.  The legs are without rashes.  The distal pulses  are intact.  Cranial nerves II - XII are intact.  Motor and sensory functions are intact.  The gait is normal.  LABORATORY DATA: From 10/02/2012 total cholesterol 180, triglycerides 59, HDL 54, LDL 114. Sodium 1:30, potassium 3.8. Creatinine 0.86.  Assessment / Plan: 1. Coronary disease status post stenting of the RCA in October of 2012 with a drug-eluting stent.  We will continue aspirin chronically. Continue diltiazem. No recurrent symptoms of dyspnea which was her presenting symptom. I requested a copy of her recent nuclear stress test.  2. Hypercholesterolemia. Patient states she cannot afford Crestor and has been off of her statin. I will switch her to Lipitor 10 mg daily.  3. COPD with remote history of tobacco use.  4. Hypertension- improved but still with labile readings. She has developed hyponatremia on HCTZ. We will stop HCTZ. Continue losartan 100 mg daily. We'll add metoprolol 25 mg twice a day. I'll followup again in 2 months. I requested a copy of her, we'll ultrasound. If kidneys are normal in size I would not recommend a renal Doppler at this time.

## 2012-10-09 NOTE — Telephone Encounter (Signed)
Returned call to pharmacist from [facility] he wanted to verify Losartan.Advised to stop Lorsartan HCT and start Lorsartan 100 mg daily.

## 2012-12-20 ENCOUNTER — Ambulatory Visit: Payer: Medicare Other | Admitting: Cardiology

## 2013-02-12 ENCOUNTER — Other Ambulatory Visit: Payer: Self-pay

## 2013-04-17 ENCOUNTER — Ambulatory Visit (INDEPENDENT_AMBULATORY_CARE_PROVIDER_SITE_OTHER): Payer: Medicare Other | Admitting: Cardiology

## 2013-04-17 ENCOUNTER — Encounter: Payer: Self-pay | Admitting: Cardiology

## 2013-04-17 VITALS — BP 140/60 | HR 72 | Ht 67.0 in | Wt 160.0 lb

## 2013-04-17 DIAGNOSIS — I10 Essential (primary) hypertension: Secondary | ICD-10-CM

## 2013-04-17 DIAGNOSIS — I2510 Atherosclerotic heart disease of native coronary artery without angina pectoris: Secondary | ICD-10-CM

## 2013-04-17 DIAGNOSIS — E785 Hyperlipidemia, unspecified: Secondary | ICD-10-CM

## 2013-04-17 NOTE — Patient Instructions (Signed)
Stop Aleve/advil/ motrin  Continue your other medication.  I will see you in 6 months.

## 2013-04-17 NOTE — Progress Notes (Signed)
Carol Bradley Date of Birth: [birth year] Medical Record #[id number]  History of Present Illness: Carol Bradley is seen for follow up. She has a history of COPD and mild pulmonary hypertension. She also is a history of coronary disease and is status post stenting of the mid to distal RCA in October 2012. Her presenting symptoms were of dyspnea and not chest pain. She also has labile hypertension. She developed a cough on ACE inhibitors. She developed hyponatremia on HCTZ. She has been on losartan. She has done well from a cardiac standpoint without any significant symptoms of chest pain or shortness of breath. Her blood pressure has done well. She reports that recent laboratory data showed abnormal kidney function. I do not have those results. She has been using Aleve frequently over the past 2 months following an injury.  Current Outpatient Prescriptions on File Prior to Visit  Medication Sig Dispense Refill  . ALPRAZolam (XANAX) 0.25 MG tablet Take 0.25 mg by mouth as directed.      Marland Kitchen aspirin EC 81 MG tablet Take 81 mg by mouth daily.        Marland Kitchen atorvastatin (LIPITOR) 10 MG tablet Take 1 tablet (10 mg total) by mouth daily.  90 tablet  3  . diltiazem (DILACOR XR) 240 MG 24 hr capsule Take 1 capsule (240 mg total) by mouth daily.  30 capsule  11  . losartan (COZAAR) 100 MG tablet Take 1 tablet (100 mg total) by mouth daily.  90 tablet  3  . metoprolol tartrate (LOPRESSOR) 25 MG tablet Take 1 tablet (25 mg total) by mouth 2 (two) times daily.  180 tablet  3  . nitroGLYCERIN (NITROSTAT) 0.4 MG SL tablet Place 0.4 mg under the tongue every 5 (five) minutes as needed.        . tiotropium (SPIRIVA) 18 MCG inhalation capsule Place 18 mcg into inhaler and inhale daily.        . [DISCONTINUED] diltiazem (CARTIA XT) 240 MG 24 hr capsule Take 1 capsule (240 mg total) by mouth daily.  30 capsule  6   No current facility-administered medications on file prior to visit.    Allergies  Allergen Reactions  .  Lisinopril Cough  . Plavix [Clopidogrel Bisulfate] Swelling    Past Medical History  Diagnosis Date  . COPD (chronic obstructive pulmonary disease)   . History of tobacco abuse   . CAD (coronary artery disease)   . Hypertension   . Hyperlipidemia     Past Surgical History  Procedure Laterality Date  . Cardiac catheterization  04/2011    right and left cath showing normal right heart pressures,but newly diagnosed coronary artery disease/drug eluting stent placed to RCA with residual disease in the proximal RCA and LAD and ramus, normal LV function and 60-65% EF  . Coronary angioplasty with stent placement  04/2011    mid RCA: 3.0 X38 mm Promus DES. Residual 40% disease proximally    History  Smoking status  . Former Smoker -- 3.00 packs/day for 25 years  . Types: Cigarettes  . Quit date: 07/10/1994  Smokeless tobacco  . Never Used    History  Alcohol Use No    Family History  Problem Relation Age of Onset  . Heart failure Mother   . Cancer Father     Review of Systems: As noted in HPI. All other systems were reviewed and are negative.  Physical Exam: BP 140/60  Pulse 72  Ht 5\' 7"  (1.702 m)  Wt  160 lb (72.576 kg)  BMI 25.05 kg/m2 She is a pleasant white female in no acute distress. The HEENT exam is normal. The carotids are 2+ without bruits.  There is no thyromegaly.  There is no JVD.  The lungs are clear.   The heart exam reveals a regular rate with a normal S1 and S2.  There are no murmurs, gallops, or rubs.  The PMI is not displaced.   Abdominal exam reveals good bowel sounds.   There is no hepatosplenomegaly or tenderness.  There are no masses.  Exam of the legs reveal no clubbing, cyanosis, or edema.  The legs are without rashes.  The distal pulses are intact.  Cranial nerves II - XII are intact.  Motor and sensory functions are intact.  The gait is normal.  LABORATORY DATA:   Assessment / Plan: 1. Coronary disease status post stenting of the RCA in October  of 2012 with a drug-eluting stent.  We will continue aspirin chronically. Continue diltiazem. No recurrent symptoms of dyspnea which was her presenting symptom.  2. Hypercholesterolemia. Continue Lipitor therapy.  3. COPD with remote history of tobacco use.  4. Hypertension- well controlled currently.  5. Renal insufficiency. This may well be related to her recent use of nonsteroidal anti-inflammatory drugs. I recommended she stop taking Aleve. She is scheduled to have repeat lab work next week.

## 2013-05-15 ENCOUNTER — Other Ambulatory Visit: Payer: Self-pay

## 2013-06-18 ENCOUNTER — Other Ambulatory Visit: Payer: Self-pay | Admitting: Cardiology

## 2013-09-16 ENCOUNTER — Other Ambulatory Visit: Payer: Self-pay | Admitting: Cardiology

## 2013-09-16 NOTE — Telephone Encounter (Signed)
Ok to refill or should this be deferred to pcp? Please advise. Thanks, MI 

## 2013-10-09 ENCOUNTER — Other Ambulatory Visit: Payer: Self-pay | Admitting: Cardiology

## 2013-10-20 ENCOUNTER — Other Ambulatory Visit: Payer: Self-pay | Admitting: Cardiology

## 2013-10-30 ENCOUNTER — Other Ambulatory Visit: Payer: Self-pay | Admitting: Cardiology

## 2013-11-07 ENCOUNTER — Ambulatory Visit (INDEPENDENT_AMBULATORY_CARE_PROVIDER_SITE_OTHER): Payer: Medicare Other | Admitting: Cardiology

## 2013-11-07 ENCOUNTER — Encounter: Payer: Self-pay | Admitting: Cardiology

## 2013-11-07 VITALS — BP 120/68 | HR 70 | Ht 67.0 in | Wt 162.4 lb

## 2013-11-07 DIAGNOSIS — I10 Essential (primary) hypertension: Secondary | ICD-10-CM

## 2013-11-07 DIAGNOSIS — I2510 Atherosclerotic heart disease of native coronary artery without angina pectoris: Secondary | ICD-10-CM

## 2013-11-07 DIAGNOSIS — E785 Hyperlipidemia, unspecified: Secondary | ICD-10-CM

## 2013-11-07 LAB — BASIC METABOLIC PANEL
BUN: 18 mg/dL (ref 6–23)
CHLORIDE: 104 meq/L (ref 96–112)
CO2: 26 mEq/L (ref 19–32)
Calcium: 8.8 mg/dL (ref 8.4–10.5)
Creatinine, Ser: 1 mg/dL (ref 0.4–1.2)
GFR: 58.42 mL/min — AB (ref >60.00)
GLUCOSE: 94 mg/dL (ref 70–99)
POTASSIUM: 3.6 meq/L (ref 3.5–5.1)
SODIUM: 135 meq/L (ref 135–145)

## 2013-11-07 LAB — LIPID PANEL
CHOLESTEROL: 134 mg/dL (ref 0–200)
HDL: 41.9 mg/dL (ref >39.00)
LDL CALC: 70 mg/dL (ref 0–99)
Total CHOL/HDL Ratio: 3
Triglycerides: 110 mg/dL (ref 0.0–149.0)
VLDL: 22 mg/dL (ref 0.0–40.0)

## 2013-11-07 LAB — CBC WITH DIFFERENTIAL/PLATELET
Basophils Absolute: 0 10*3/uL (ref 0.0–0.1)
Basophils Relative: 0.1 % (ref 0.0–3.0)
EOS PCT: 1.5 % (ref 0.0–5.0)
Eosinophils Absolute: 0.1 10*3/uL (ref 0.0–0.7)
HCT: 33 % — ABNORMAL LOW (ref 36.0–46.0)
Hemoglobin: 11.2 g/dL — ABNORMAL LOW (ref 12.0–15.0)
LYMPHS PCT: 22.8 % (ref 12.0–46.0)
Lymphs Abs: 1.5 10*3/uL (ref 0.7–4.0)
MCHC: 33.8 g/dL (ref 30.0–36.0)
MCV: 93.1 fl (ref 78.0–100.0)
Monocytes Absolute: 0.7 10*3/uL (ref 0.1–1.0)
Monocytes Relative: 10.2 % (ref 3.0–12.0)
NEUTROS PCT: 65.4 % (ref 43.0–77.0)
Neutro Abs: 4.2 10*3/uL (ref 1.4–7.7)
PLATELETS: 220 10*3/uL (ref 150.0–400.0)
RBC: 3.55 Mil/uL — AB (ref 3.87–5.11)
RDW: 13.1 % (ref 11.5–14.6)
WBC: 6.4 10*3/uL (ref 4.5–10.5)

## 2013-11-07 LAB — HEPATIC FUNCTION PANEL
ALT: 18 U/L (ref 0–35)
AST: 32 U/L (ref 0–37)
Albumin: 3.1 g/dL — ABNORMAL LOW (ref 3.5–5.2)
Alkaline Phosphatase: 65 U/L (ref 39–117)
BILIRUBIN TOTAL: 0.5 mg/dL (ref 0.3–1.2)
Bilirubin, Direct: 0 mg/dL (ref 0.0–0.3)
Total Protein: 8.4 g/dL — ABNORMAL HIGH (ref 6.0–8.3)

## 2013-11-07 NOTE — Addendum Note (Signed)
Addended by: Ernestina Joe, Dola Factor D on: 11/07/2013 02:44 PM   Modules accepted: Orders

## 2013-11-07 NOTE — Patient Instructions (Signed)
We will check lab work today  I will see you in 6 months 

## 2013-11-07 NOTE — Progress Notes (Signed)
Carol Bradley Date of Birth: 1941/01/16 Medical Record #[id number]  History of Present Illness: Carol Bradley is seen for follow up. She has a history of COPD and mild pulmonary hypertension. She also is a history of coronary disease and is status post stenting of the mid to distal RCA in October 2012. Her presenting symptoms were of dyspnea and not chest pain. She also has labile hypertension. She developed a cough on ACE inhibitors. She developed hyponatremia on HCTZ. She has been on losartan. She has done well from a cardiac standpoint without any significant symptoms of chest pain or shortness of breath. Her blood pressure has done well. She did fall and fractured her right leg in January. This limited her activity but she is better now.  Current Outpatient Prescriptions on File Prior to Visit  Medication Sig Dispense Refill  . aspirin EC 81 MG tablet Take 81 mg by mouth daily.        Marland Kitchen atorvastatin (LIPITOR) 10 MG tablet Take 1 tablet (10 mg total) by mouth daily.  90 tablet  3  . diltiazem (DILACOR XR) 240 MG 24 hr capsule Take 1 capsule (240 mg total) by mouth daily.  30 capsule  11  . losartan (COZAAR) 100 MG tablet TAKE ONE TABLET BY MOUTH DAILY  90 tablet  0  . metoprolol tartrate (LOPRESSOR) 25 MG tablet TAKE ONE TABLET BY MOUTH TWICE DAILY  180 tablet  0  . nitroGLYCERIN (NITROSTAT) 0.4 MG SL tablet Place 0.4 mg under the tongue every 5 (five) minutes as needed.        . tiotropium (SPIRIVA) 18 MCG inhalation capsule Place 18 mcg into inhaler and inhale daily.         No current facility-administered medications on file prior to visit.    Allergies  Allergen Reactions  . Lisinopril Cough  . Plavix [Clopidogrel Bisulfate] Swelling    Past Medical History  Diagnosis Date  . COPD (chronic obstructive pulmonary disease)   . History of tobacco abuse   . CAD (coronary artery disease)   . Hypertension   . Hyperlipidemia     Past Surgical History  Procedure Laterality Date  .  Cardiac catheterization  04/2011    right and left cath showing normal right heart pressures,but newly diagnosed coronary artery disease/drug eluting stent placed to RCA with residual disease in the proximal RCA and LAD and ramus, normal LV function and 60-65% EF  . Coronary angioplasty with stent placement  04/2011    mid RCA: 3.0 X38 mm Promus DES. Residual 40% disease proximally    History  Smoking status  . Former Smoker -- 3.00 packs/day for 25 years  . Types: Cigarettes  . Quit date: 07/10/1994  Smokeless tobacco  . Never Used    History  Alcohol Use No    Family History  Problem Relation Age of Onset  . Heart failure Mother   . Cancer Father     Review of Systems: As noted in HPI. All other systems were reviewed and are negative.  Physical Exam: BP 120/68  Pulse 70  Ht 5\' 7"  (1.702 m)  Wt 162 lb 6.4 oz (73.664 kg)  BMI 25.43 kg/m2 She is a pleasant white female in no acute distress. The HEENT exam is normal. The carotids are 2+ without bruits.  There is no thyromegaly.  There is no JVD.  The lungs are clear.   The heart exam reveals a regular rate with a normal S1 and S2.  There are no murmurs, gallops, or rubs.  The PMI is not displaced.   Abdominal exam reveals good bowel sounds.   There is no hepatosplenomegaly or tenderness.  There are no masses.  Exam of the legs reveal no clubbing, cyanosis, or edema.  The legs are without rashes.  The distal pulses are intact.  Cranial nerves II - XII are intact.  Motor and sensory functions are intact.  The gait is normal.  LABORATORY DATA: Ecg: NSR with possible septal infarct. Otherwise normal.  Assessment / Plan: 1. Coronary disease status post stenting of the RCA in October of 2012 with a drug-eluting stent.  We will continue aspirin chronically. Continue diltiazem. No significant dyspnea.  2. Hypercholesterolemia. Continue Lipitor therapy.  3. COPD with remote history of tobacco use.  4. Hypertension- well controlled  currently.  5. Renal insufficiency.   We will check labs today including CBC, lipids, and chemistries.

## 2013-11-10 ENCOUNTER — Other Ambulatory Visit: Payer: Self-pay | Admitting: Cardiology

## 2013-11-18 ENCOUNTER — Other Ambulatory Visit: Payer: Self-pay | Admitting: Cardiology

## 2014-01-07 ENCOUNTER — Other Ambulatory Visit: Payer: Self-pay | Admitting: Cardiology

## 2014-01-30 ENCOUNTER — Other Ambulatory Visit: Payer: Self-pay | Admitting: Cardiology

## 2014-04-08 ENCOUNTER — Telehealth: Payer: Self-pay | Admitting: Cardiology

## 2014-04-08 ENCOUNTER — Telehealth: Payer: Self-pay

## 2014-04-08 NOTE — Telephone Encounter (Signed)
Received a message you and your husband needed a follow up appointment with Dr.Jordan on same day.Appointments scheduled with Dr.Jordan 04/29/14 at 4:15 and 4:30 pm.Advised to come to the [facility] office.Patient stated she has been having sob.Stated she thinks if might be the weather.Advised to call back for a sooner appointment if needed.

## 2014-04-08 NOTE — Telephone Encounter (Signed)
Closed encounter °

## 2014-04-29 ENCOUNTER — Ambulatory Visit (INDEPENDENT_AMBULATORY_CARE_PROVIDER_SITE_OTHER): Payer: Medicare Other | Admitting: Cardiology

## 2014-04-29 ENCOUNTER — Encounter: Payer: Self-pay | Admitting: Cardiology

## 2014-04-29 VITALS — BP 132/70 | HR 72 | Ht 67.0 in | Wt 158.0 lb

## 2014-04-29 DIAGNOSIS — I209 Angina pectoris, unspecified: Secondary | ICD-10-CM

## 2014-04-29 DIAGNOSIS — I10 Essential (primary) hypertension: Secondary | ICD-10-CM

## 2014-04-29 DIAGNOSIS — R0609 Other forms of dyspnea: Secondary | ICD-10-CM

## 2014-04-29 DIAGNOSIS — I25119 Atherosclerotic heart disease of native coronary artery with unspecified angina pectoris: Secondary | ICD-10-CM

## 2014-04-29 DIAGNOSIS — I2510 Atherosclerotic heart disease of native coronary artery without angina pectoris: Secondary | ICD-10-CM

## 2014-04-29 NOTE — Progress Notes (Signed)
Carol Bradley Date of Birth: 07-Dec-1940 Medical Record #[id number]  History of Present Illness: Carol Bradley is seen for follow up. She has a history of COPD and mild pulmonary hypertension. She also is a history of coronary disease and is status post stenting of the mid to distal RCA in October 2012. Her presenting symptoms were of dyspnea and not chest pain. She also has labile hypertension. She developed a cough on ACE inhibitors. She developed hyponatremia on HCTZ. She has been on losartan.  On follow up today she reports increased dyspnea on exertion over the last 4-5 weeks. No edema. No chest pain. No cough. Worse walking up stairs. Though it might be related to humid weather recently. Last myoview study 3/14 in [place] was normal. Symptoms similar to when she had a stent.   Current Outpatient Prescriptions on File Prior to Visit  Medication Sig Dispense Refill  . aspirin EC 81 MG tablet Take 81 mg by mouth daily.        Marland Kitchen atorvastatin (LIPITOR) 10 MG tablet TAKE ONE TABLET BY MOUTH DAILY  90 tablet  1  . diltiazem (DILACOR XR) 240 MG 24 hr capsule Take 1 capsule (240 mg total) by mouth daily.  30 capsule  11  . losartan (COZAAR) 100 MG tablet TAKE ONE TABLET BY MOUTH DAILY.  90 tablet  1  . metoprolol tartrate (LOPRESSOR) 25 MG tablet TAKE ONE TABLET BY MOUTH TWICE DAILY  180 tablet  1  . nitroGLYCERIN (NITROSTAT) 0.4 MG SL tablet Place 0.4 mg under the tongue every 5 (five) minutes as needed.        . tiotropium (SPIRIVA) 18 MCG inhalation capsule Place 18 mcg into inhaler and inhale daily.         No current facility-administered medications on file prior to visit.    Allergies  Allergen Reactions  . Lisinopril Cough  . Plavix [Clopidogrel Bisulfate] Swelling    Past Medical History  Diagnosis Date  . COPD (chronic obstructive pulmonary disease)   . History of tobacco abuse   . CAD (coronary artery disease)   . Hypertension   . Hyperlipidemia     Past Surgical History   Procedure Laterality Date  . Cardiac catheterization  04/2011    right and left cath showing normal right heart pressures,but newly diagnosed coronary artery disease/drug eluting stent placed to RCA with residual disease in the proximal RCA and LAD and ramus, normal LV function and 60-65% EF  . Coronary angioplasty with stent placement  04/2011    mid RCA: 3.0 X38 mm Promus DES. Residual 40% disease proximally    History  Smoking status  . Former Smoker -- 3.00 packs/day for 25 years  . Types: Cigarettes  . Quit date: 07/10/1994  Smokeless tobacco  . Never Used    History  Alcohol Use No    Family History  Problem Relation Age of Onset  . Heart failure Mother   . Cancer Father     Review of Systems: As noted in HPI. All other systems were reviewed and are negative.  Physical Exam: BP 132/70  Pulse 72  Ht 5\' 7"  (1.702 m)  Wt 158 lb (71.668 kg)  BMI 24.74 kg/m2 She is a pleasant white female in no acute distress. The HEENT exam is normal. The carotids are 2+ without bruits.  There is no thyromegaly.  There is no JVD.  The lungs are clear.   The heart exam reveals a regular rate with a normal  S1 and S2.  There are no murmurs, gallops, or rubs.  The PMI is not displaced.   Abdominal exam reveals good bowel sounds.   There is no hepatosplenomegaly or tenderness.  There are no masses.  Exam of the legs reveal no clubbing, cyanosis, or edema.  The legs are without rashes.  The distal pulses are intact.  Cranial nerves II - XII are intact.  Motor and sensory functions are intact.  The gait is normal.  LABORATORY DATA: Lab Results  Component Value Date   WBC 6.4 11/07/2013   HGB 11.2* 11/07/2013   HCT 33.0* 11/07/2013   PLT 220.0 11/07/2013   GLUCOSE 94 11/07/2013   CHOL 134 11/07/2013   TRIG 110.0 11/07/2013   HDL 41.90 11/07/2013   LDLCALC 70 11/07/2013   ALT 18 11/07/2013   AST 32 11/07/2013   NA 135 11/07/2013   K 3.6 11/07/2013   CL 104 11/07/2013   CREATININE 1.0 11/07/2013   BUN 18 11/07/2013    CO2 26 11/07/2013   INR 1.04 05/05/2011     Assessment / Plan: 1. Coronary disease status post stenting of the RCA in October of 2012 with a drug-eluting stent.  We will continue aspirin chronically. Continue diltiazem. Now with symptoms of dyspnea on exertion similar to prior angina. I have recommended a stress Myoview study. If abnormal may need to consider cardiac cath.  2. Hypercholesterolemia. Continue Lipitor therapy.  3. COPD with remote history of tobacco use.  4. Hypertension- well controlled currently.

## 2014-04-29 NOTE — Patient Instructions (Signed)
We will schedule you for a stress Myoview   

## 2014-05-08 ENCOUNTER — Telehealth (HOSPITAL_COMMUNITY): Payer: Self-pay

## 2014-05-08 NOTE — Telephone Encounter (Signed)
Encounter complete. 

## 2014-05-13 ENCOUNTER — Encounter (HOSPITAL_COMMUNITY): Payer: Medicare Other

## 2014-05-19 ENCOUNTER — Other Ambulatory Visit: Payer: Self-pay | Admitting: Cardiology

## 2014-08-12 ENCOUNTER — Encounter (INDEPENDENT_AMBULATORY_CARE_PROVIDER_SITE_OTHER): Payer: Self-pay | Admitting: *Deleted

## 2014-08-13 ENCOUNTER — Telehealth: Payer: Self-pay | Admitting: Cardiology

## 2014-08-13 NOTE — Telephone Encounter (Signed)
Close encounter 

## 2014-08-17 ENCOUNTER — Other Ambulatory Visit: Payer: Self-pay

## 2014-08-17 MED ORDER — METOPROLOL TARTRATE 25 MG PO TABS: 25.0000 mg | ORAL_TABLET | Freq: Two times a day (BID) | ORAL | Status: DC

## 2014-09-08 ENCOUNTER — Other Ambulatory Visit (INDEPENDENT_AMBULATORY_CARE_PROVIDER_SITE_OTHER): Payer: Self-pay | Admitting: *Deleted

## 2014-09-08 ENCOUNTER — Ambulatory Visit (INDEPENDENT_AMBULATORY_CARE_PROVIDER_SITE_OTHER): Payer: Medicare Other | Admitting: Internal Medicine

## 2014-09-08 ENCOUNTER — Telehealth (INDEPENDENT_AMBULATORY_CARE_PROVIDER_SITE_OTHER): Payer: Self-pay | Admitting: *Deleted

## 2014-09-08 ENCOUNTER — Encounter (INDEPENDENT_AMBULATORY_CARE_PROVIDER_SITE_OTHER): Payer: Self-pay | Admitting: Internal Medicine

## 2014-09-08 VITALS — BP 134/66 | HR 56 | Temp 97.6°F | Ht 67.0 in | Wt 156.2 lb

## 2014-09-08 DIAGNOSIS — Z1211 Encounter for screening for malignant neoplasm of colon: Secondary | ICD-10-CM

## 2014-09-08 DIAGNOSIS — R195 Other fecal abnormalities: Secondary | ICD-10-CM

## 2014-09-08 DIAGNOSIS — D649 Anemia, unspecified: Secondary | ICD-10-CM

## 2014-09-08 DIAGNOSIS — D6489 Other specified anemias: Secondary | ICD-10-CM

## 2014-09-08 LAB — CBC WITH DIFFERENTIAL/PLATELET
BASOS ABS: 0 10*3/uL (ref 0.0–0.1)
BASOS PCT: 0 % (ref 0–1)
EOS ABS: 0.2 10*3/uL (ref 0.0–0.7)
Eosinophils Relative: 3 % (ref 0–5)
HEMATOCRIT: 30.9 % — AB (ref 36.0–46.0)
Hemoglobin: 10.3 g/dL — ABNORMAL LOW (ref 12.0–15.0)
Lymphocytes Relative: 25 % (ref 12–46)
Lymphs Abs: 1.8 10*3/uL (ref 0.7–4.0)
MCH: 31.2 pg (ref 26.0–34.0)
MCHC: 33.3 g/dL (ref 30.0–36.0)
MCV: 93.6 fL (ref 78.0–100.0)
MONO ABS: 0.9 10*3/uL (ref 0.1–1.0)
MPV: 9.4 fL (ref 8.6–12.4)
Monocytes Relative: 12 % (ref 3–12)
NEUTROS ABS: 4.4 10*3/uL (ref 1.7–7.7)
NEUTROS PCT: 60 % (ref 43–77)
PLATELETS: 178 10*3/uL (ref 150–400)
RBC: 3.3 MIL/uL — ABNORMAL LOW (ref 3.87–5.11)
RDW: 14.8 % (ref 11.5–15.5)
WBC: 7.3 10*3/uL (ref 4.0–10.5)

## 2014-09-08 MED ORDER — PEG-KCL-NACL-NASULF-NA ASC-C 100 G PO SOLR: 1.0000 | Freq: Once | ORAL | Status: DC

## 2014-09-08 NOTE — Progress Notes (Signed)
   Subjective:    Patient ID: Carol Bradley, female    DOB: Jul 19, 1940, 74 y.o.   MRN: [id number]  HPI Referred to our office by Dr.Vyas for positive stool card in January. Last colonoscopy in 2011 by Dr. Truddie Hidden which revealed few sigmoid diverticula. Small benign appearing polyp at 15cm mark removed, distal rectal inflammation cold biopsy x 3.  Appetite is good. No weight loss.  Lower abdominal pain which she describes as crampy, type. Occurs every day and not related to a BM. She usually has a BM about daily. No melena or BRRB.      Last colonoscopy in 2011 by Dr. Truddie Hidden which revealed few sigmoid diverticula. Small benign appearing polyp at 15cm mark removed, distal rectal inflammation cold biopsy x 3.    07/20/2014 Albumin 3.3, Globulin 6.7, Total bili 0.4, ALP 94, AST 23, ALT 9, H and H 10.8 and 31.3, MCV 89 Review of Systems Married. Two children. One deceased age 24. Died in her sleep. One in good health.   Past Medical History  Diagnosis Date  . COPD (chronic obstructive pulmonary disease)   . History of tobacco abuse   . CAD (coronary artery disease)   . Hypertension   . Hyperlipidemia     Past Surgical History  Procedure Laterality Date  . Cardiac catheterization  04/2011    right and left cath showing normal right heart pressures,but newly diagnosed coronary artery disease/drug eluting stent placed to RCA with residual disease in the proximal RCA and LAD and ramus, normal LV function and 60-65% EF  . Coronary angioplasty with stent placement  04/2011    mid RCA: 3.0 X38 mm Promus DES. Residual 40% disease proximally    Allergies  Allergen Reactions  . Lisinopril Cough  . Plavix [Clopidogrel Bisulfate] Swelling    Current Outpatient Prescriptions on File Prior to Visit  Medication Sig Dispense Refill  . aspirin EC 81 MG tablet Take 81 mg by mouth daily.      Marland Kitchen atorvastatin (LIPITOR) 10 MG tablet TAKE ONE TABLET BY MOUTH DAILY 90 tablet 1  . diltiazem (CARDIZEM CD)  240 MG 24 hr capsule TAKE ONE CAPSULE BY MOUTH DAILY 30 capsule 5  . losartan (COZAAR) 100 MG tablet TAKE ONE TABLET BY MOUTH DAILY. 90 tablet 1  . metoprolol tartrate (LOPRESSOR) 25 MG tablet Take 1 tablet (25 mg total) by mouth 2 (two) times daily. 180 tablet 1  . tiotropium (SPIRIVA) 18 MCG inhalation capsule Place 18 mcg into inhaler and inhale daily.       No current facility-administered medications on file prior to visit.        Objective:   Physical Exam  Filed Vitals:   09/08/14 1014  Height: 5\' 7"  (1.702 m)  Weight: 156 lb 3.2 oz (70.852 kg)   Alert and oriented. Skin warm and dry. Oral mucosa is moist.   . Sclera anicteric, conjunctivae is pink. Thyroid not enlarged. No cervical lymphadenopathy. Lungs clear. Heart regular rate and rhythm.  Abdomen is soft. Bowel sounds are positive. No hepatomegaly. No abdominal masses felt. No tenderness.  No edema to lower extremities.  Stool brown and guaiac negative.      Assessment & Plan:  Positive stool card. Hemoglobin slightly low 10.5. ? Whether this is chronic. Needs surveillance colonoscopy.  CBC today

## 2014-09-08 NOTE — Patient Instructions (Addendum)
CBC today.  Colonoscopy. The risks and benefits such as perforation, bleeding, and infection were reviewed with the patient and is agreeable.

## 2014-09-08 NOTE — Telephone Encounter (Signed)
Patient needs movi prep 

## 2014-09-10 ENCOUNTER — Other Ambulatory Visit: Payer: Self-pay | Admitting: Cardiology

## 2014-09-17 ENCOUNTER — Encounter (INDEPENDENT_AMBULATORY_CARE_PROVIDER_SITE_OTHER): Payer: Self-pay | Admitting: *Deleted

## 2014-09-29 ENCOUNTER — Encounter (INDEPENDENT_AMBULATORY_CARE_PROVIDER_SITE_OTHER): Payer: Self-pay

## 2014-09-30 ENCOUNTER — Encounter (HOSPITAL_COMMUNITY): Payer: Self-pay | Admitting: *Deleted

## 2014-09-30 ENCOUNTER — Encounter (HOSPITAL_COMMUNITY)
Admission: RE | Disposition: A | Discharge status: Home / Self Care | Payer: Self-pay | Source: Ambulatory Visit | Attending: Internal Medicine

## 2014-09-30 ENCOUNTER — Ambulatory Visit (HOSPITAL_COMMUNITY)
Admission: RE | Admit: 2014-09-30 | Discharge: 2014-09-30 | Disposition: A | Discharge status: Home / Self Care | Payer: Medicare Other | Source: Ambulatory Visit | Attending: Internal Medicine | Admitting: Internal Medicine

## 2014-09-30 DIAGNOSIS — I10 Essential (primary) hypertension: Secondary | ICD-10-CM | POA: Diagnosis not present

## 2014-09-30 DIAGNOSIS — R195 Other fecal abnormalities: Secondary | ICD-10-CM

## 2014-09-30 DIAGNOSIS — Z79899 Other long term (current) drug therapy: Secondary | ICD-10-CM | POA: Insufficient documentation

## 2014-09-30 DIAGNOSIS — K649 Unspecified hemorrhoids: Secondary | ICD-10-CM | POA: Diagnosis not present

## 2014-09-30 DIAGNOSIS — Z888 Allergy status to other drugs, medicaments and biological substances status: Secondary | ICD-10-CM | POA: Diagnosis not present

## 2014-09-30 DIAGNOSIS — D649 Anemia, unspecified: Secondary | ICD-10-CM

## 2014-09-30 DIAGNOSIS — K5730 Diverticulosis of large intestine without perforation or abscess without bleeding: Secondary | ICD-10-CM | POA: Insufficient documentation

## 2014-09-30 DIAGNOSIS — Z09 Encounter for follow-up examination after completed treatment for conditions other than malignant neoplasm: Secondary | ICD-10-CM | POA: Diagnosis present

## 2014-09-30 DIAGNOSIS — Z955 Presence of coronary angioplasty implant and graft: Secondary | ICD-10-CM | POA: Insufficient documentation

## 2014-09-30 DIAGNOSIS — J449 Chronic obstructive pulmonary disease, unspecified: Secondary | ICD-10-CM | POA: Insufficient documentation

## 2014-09-30 DIAGNOSIS — Z87891 Personal history of nicotine dependence: Secondary | ICD-10-CM | POA: Diagnosis not present

## 2014-09-30 DIAGNOSIS — E785 Hyperlipidemia, unspecified: Secondary | ICD-10-CM | POA: Diagnosis not present

## 2014-09-30 DIAGNOSIS — I2510 Atherosclerotic heart disease of native coronary artery without angina pectoris: Secondary | ICD-10-CM | POA: Insufficient documentation

## 2014-09-30 DIAGNOSIS — Z7982 Long term (current) use of aspirin: Secondary | ICD-10-CM | POA: Insufficient documentation

## 2014-09-30 DIAGNOSIS — K648 Other hemorrhoids: Secondary | ICD-10-CM

## 2014-09-30 HISTORY — PX: COLONOSCOPY: SHX5424

## 2014-09-30 SURGERY — COLONOSCOPY
Anesthesia: Moderate Sedation

## 2014-09-30 MED ORDER — STERILE WATER FOR IRRIGATION IR SOLN
Status: DC | PRN
  Administered 2014-09-30: 13:00:00

## 2014-09-30 MED ORDER — MEPERIDINE HCL 50 MG/ML IJ SOLN
INTRAMUSCULAR | Status: AC
  Filled 2014-09-30: qty 1

## 2014-09-30 MED ORDER — SODIUM CHLORIDE 0.9 % IV SOLN
INTRAVENOUS | Status: DC
  Administered 2014-09-30: 13:00:00 via INTRAVENOUS

## 2014-09-30 MED ORDER — MIDAZOLAM HCL 5 MG/5ML IJ SOLN
INTRAMUSCULAR | Status: AC
  Filled 2014-09-30: qty 10

## 2014-09-30 MED ORDER — MEPERIDINE HCL 50 MG/ML IJ SOLN
INTRAMUSCULAR | Status: DC | PRN
  Administered 2014-09-30 (×2): 25 mg

## 2014-09-30 MED ORDER — MIDAZOLAM HCL 5 MG/5ML IJ SOLN
INTRAMUSCULAR | Status: DC | PRN
  Administered 2014-09-30: 1 mg via INTRAVENOUS
  Administered 2014-09-30 (×2): 2 mg via INTRAVENOUS
  Administered 2014-09-30: 1 mg via INTRAVENOUS

## 2014-09-30 MED ORDER — HYOSCYAMINE SULFATE 0.125 MG SL SUBL: 0.1250 mg | SUBLINGUAL_TABLET | Freq: Three times a day (TID) | SUBLINGUAL | Status: DC | PRN

## 2014-09-30 NOTE — Op Note (Signed)
COLONOSCOPY PROCEDURE REPORT  PATIENT:  Carol Bradley  MR#:  [id number] Birthdate:  Jun 02, 1941, 74 y.o., female Endoscopist:  Dr. Rogene Houston, MD Referred By:  Dr. [name], MD Procedure Date: 09/30/2014  Procedure:   Colonoscopy  Indications:  Patient is 74 year old Caucasian female was recently noted to have heme-positive stool hemoglobin of 10.3 with normal MCV. She also gives history of involuntary weight loss of 20 pounds since November last year and complains of intermittent pain across lower abdomen. Last colonoscopy was about 5 years ago with removal of single polyp but histology not available. Family history is negative for CRC.  Informed Consent:  The procedure and risks were reviewed with the patient and informed consent was obtained.  Medications:  Demerol 50 mg IV Versed 6 mg IV  Description of procedure:  After a digital rectal exam was performed, that colonoscope was advanced from the anus through the rectum and colon to the area of the cecum, ileocecal valve and appendiceal orifice. The cecum was deeply intubated. These structures were well-seen and photographed for the record. From the level of the cecum and ileocecal valve, the scope was slowly and cautiously withdrawn. The mucosal surfaces were carefully surveyed utilizing scope tip to flexion to facilitate fold flattening as needed. The scope was pulled down into the rectum where a thorough exam including retroflexion was performed. Terminal ileum was also examined.  Findings:   Prep excellent. Normal mucosa of terminal ileum. Few small diverticula at descending and sigmoid colon. Normal rectal mucosa. Small hemorrhoids below the dentate line.    Therapeutic/Diagnostic Maneuvers Performed:   None  Complications:  None  Cecal Withdrawal Time:  8 minutes  Impression:  Normal mucosa of terminal ileum. Mild left-sided colonic diverticulosis. Small external hemorrhoids.   Recommendations:  Standard  instructions given. Hyoscyamine sublingual 1 tablet up to 3 times a day as needed. Will proceed with abdominopelvic CT with contrast.  Tylena Prisk U  09/30/2014 2:04 PM  CC: Dr. [name]., MD & Dr. Rayne Du ref. provider found

## 2014-09-30 NOTE — Discharge Instructions (Signed)
Resume usual medications and diet. Hyoscyamine sublingual 1 tablet up to 3 times a day as needed for lower abdominal pain. No driving for 24 hours. Abdominopelvic CT to be scheduled. Office will call.  Colonoscopy, Care After Refer to this sheet in the next few weeks. These instructions provide you with information on caring for yourself after your procedure. Your health care provider may also give you more specific instructions. Your treatment has been planned according to current medical practices, but problems sometimes occur. Call your health care provider if you have any problems or questions after your procedure. WHAT TO EXPECT AFTER THE PROCEDURE  After your procedure, it is typical to have the following:  A small amount of blood in your stool.  Moderate amounts of gas and mild abdominal cramping or bloating. HOME CARE INSTRUCTIONS  Do not drive, operate machinery, or sign important documents for 24 hours.  You may shower and resume your regular physical activities, but move at a slower pace for the first 24 hours.  Take frequent rest periods for the first 24 hours.  Walk around or put a warm pack on your abdomen to help reduce abdominal cramping and bloating.  Drink enough fluids to keep your urine clear or pale yellow.  You may resume your normal diet as instructed by your health care provider. Avoid heavy or fried foods that are hard to digest.  Avoid drinking alcohol for 24 hours or as instructed by your health care provider.  Only take over-the-counter or prescription medicines as directed by your health care provider.  If a tissue sample (biopsy) was taken during your procedure:  Do not take aspirin or blood thinners for 7 days, or as instructed by your health care provider.  Do not drink alcohol for 7 days, or as instructed by your health care provider.  Eat soft foods for the first 24 hours. SEEK MEDICAL CARE IF: You have persistent spotting of blood in your  stool 2-3 days after the procedure. SEEK IMMEDIATE MEDICAL CARE IF:  You have more than a small spotting of blood in your stool.  You pass large blood clots in your stool.  Your abdomen is swollen (distended).  You have nausea or vomiting.  You have a fever.  You have increasing abdominal pain that is not relieved with medicine. Diverticulosis Diverticulosis is the condition that develops when small pouches (diverticula) form in the wall of your colon. Your colon, or large intestine, is where water is absorbed and stool is formed. The pouches form when the inside layer of your colon pushes through weak spots in the outer layers of your colon. CAUSES  No one knows exactly what causes diverticulosis. RISK FACTORS  Being older than 88. Your risk for this condition increases with age. Diverticulosis is rare in people younger than 40 years. By age 56, almost everyone has it.  Eating a low-fiber diet.  Being frequently constipated.  Being overweight.  Not getting enough exercise.  Smoking.  Taking over-the-counter pain medicines, like aspirin and ibuprofen. SYMPTOMS  Most people with diverticulosis do not have symptoms. DIAGNOSIS  Because diverticulosis often has no symptoms, health care providers often discover the condition during an exam for other colon problems. In many cases, a health care provider will diagnose diverticulosis while using a flexible scope to examine the colon (colonoscopy). TREATMENT  If you have never developed an infection related to diverticulosis, you may not need treatment. If you have had an infection before, treatment may include:  Eating more  fruits, vegetables, and grains.  Taking a fiber supplement.  Taking a live bacteria supplement (probiotic).  Taking medicine to relax your colon. HOME CARE INSTRUCTIONS   Drink at least 6-8 glasses of water each day to prevent constipation.  Try not to strain when you have a bowel movement.  Keep all  follow-up appointments. If you have had an infection before:  Increase the fiber in your diet as directed by your health care provider or dietitian.  Take a dietary fiber supplement if your health care provider approves.  Only take medicines as directed by your health care provider. SEEK MEDICAL CARE IF:   You have abdominal pain.  You have bloating.  You have cramps.  You have not gone to the bathroom in 3 days. SEEK IMMEDIATE MEDICAL CARE IF:   Your pain gets worse.  Yourbloating becomes very bad.  You have a fever or chills, and your symptoms suddenly get worse.  You begin vomiting.  You have bowel movements that are bloody or black. MAKE SURE YOU:  Understand these instructions.  Will watch your condition.  Will get help right away if you are not doing well or get worse. Marland Kitchen Hemorrhoids Hemorrhoids are swollen veins around the rectum or anus. There are two types of hemorrhoids:   Internal hemorrhoids. These occur in the veins just inside the rectum. They may poke through to the outside and become irritated and painful.  External hemorrhoids. These occur in the veins outside the anus and can be felt as a painful swelling or hard lump near the anus. CAUSES  Pregnancy.   Obesity.   Constipation or diarrhea.   Straining to have a bowel movement.   Sitting for long periods on the toilet.  Heavy lifting or other activity that caused you to strain.  Anal intercourse. SYMPTOMS   Pain.   Anal itching or irritation.   Rectal bleeding.   Fecal leakage.   Anal swelling.   One or more lumps around the anus.  DIAGNOSIS  Your caregiver may be able to diagnose hemorrhoids by visual examination. Other examinations or tests that may be performed include:   Examination of the rectal area with a gloved hand (digital rectal exam).   Examination of anal canal using a small tube (scope).   A blood test if you have lost a significant amount of  blood.  A test to look inside the colon (sigmoidoscopy or colonoscopy). TREATMENT Most hemorrhoids can be treated at home. However, if symptoms do not seem to be getting better or if you have a lot of rectal bleeding, your caregiver may perform a procedure to help make the hemorrhoids get smaller or remove them completely. Possible treatments include:   Placing a rubber band at the base of the hemorrhoid to cut off the circulation (rubber band ligation).   Injecting a chemical to shrink the hemorrhoid (sclerotherapy).   Using a tool to burn the hemorrhoid (infrared light therapy).   Surgically removing the hemorrhoid (hemorrhoidectomy).   Stapling the hemorrhoid to block blood flow to the tissue (hemorrhoid stapling).  HOME CARE INSTRUCTIONS   Eat foods with fiber, such as whole grains, beans, nuts, fruits, and vegetables. Ask your doctor about taking products with added fiber in them (fibersupplements).  Increase fluid intake. Drink enough water and fluids to keep your urine clear or pale yellow.   Exercise regularly.   Go to the bathroom when you have the urge to have a bowel movement. Do not wait.   Avoid  straining to have bowel movements.   Keep the anal area dry and clean. Use wet toilet paper or moist towelettes after a bowel movement.   Medicated creams and suppositories may be used or applied as directed.   Only take over-the-counter or prescription medicines as directed by your caregiver.   Take warm sitz baths for 15-20 minutes, 3-4 times a day to ease pain and discomfort.   Place ice packs on the hemorrhoids if they are tender and swollen. Using ice packs between sitz baths may be helpful.   Put ice in a plastic bag.   Place a towel between your skin and the bag.   Leave the ice on for 15-20 minutes, 3-4 times a day.   Do not use a donut-shaped pillow or sit on the toilet for long periods. This increases blood pooling and pain.  SEEK MEDICAL  CARE IF:  You have increasing pain and swelling that is not controlled by treatment or medicine.  You have uncontrolled bleeding.  You have difficulty or you are unable to have a bowel movement.  You have pain or inflammation outside the area of the hemorrhoids. MAKE SURE YOU:  Understand these instructions.  Will watch your condition.  Will get help right away if you are not doing well or get worse.

## 2014-09-30 NOTE — H&P (Signed)
Carol Bradley is an 74 y.o. female.   Chief Complaint: Patient is here for colonoscopy. HPI: Patient is 74 year old Caucasian female presents with three-month history of intermittent pain across lower abdomen described as menstrual cramps and not associated with diarrhea bleeding. Patient was found to have heme-positive stool and mild anemia. There is no history of melena nausea vomiting chronic heartburn. Last colonoscopy was in 2010 with removal of single polyp at [facility]. Patient states she has lost 20 pounds since November 2015 but over the last few weeks or weight has leveled off. Patient is on low-dose aspirin but does not take other OTC NSAIDs. History is negative for CRC.  Past Medical History  Diagnosis Date  . COPD (chronic obstructive pulmonary disease)   . History of tobacco abuse   . CAD (coronary artery disease)   . Hypertension   . Hyperlipidemia     Past Surgical History  Procedure Laterality Date  . Cardiac catheterization  04/2011    right and left cath showing normal right heart pressures,but newly diagnosed coronary artery disease/drug eluting stent placed to RCA with residual disease in the proximal RCA and LAD and ramus, normal LV function and 60-65% EF  . Coronary angioplasty with stent placement  04/2011    mid RCA: 3.0 X38 mm Promus DES. Residual 40% disease proximally    Family History  Problem Relation Age of Onset  . Heart failure Mother   . Cancer Father    Social History:  reports that she quit smoking about 20 years ago. Her smoking use included Cigarettes. She has a 75 pack-year smoking history. She has never used smokeless tobacco. She reports that she does not drink alcohol. Her drug history is not on file.  Allergies:  Allergies  Allergen Reactions  . Lisinopril Cough  . Plavix [Clopidogrel Bisulfate] Swelling    Medications Prior to Admission  Medication Sig Dispense Refill  . aspirin EC 81 MG tablet Take 81 mg by mouth daily.      Marland Kitchen  atorvastatin (LIPITOR) 10 MG tablet TAKE ONE TABLET BY MOUTH DAILY 90 tablet 0  . cyanocobalamin 1000 MCG tablet Inject 100 mcg as directed once a week.    . diltiazem (CARDIZEM CD) 240 MG 24 hr capsule TAKE ONE CAPSULE BY MOUTH DAILY 30 capsule 5  . losartan (COZAAR) 100 MG tablet TAKE ONE TABLET BY MOUTH DAILY. 90 tablet 1  . metoprolol tartrate (LOPRESSOR) 25 MG tablet Take 1 tablet (25 mg total) by mouth 2 (two) times daily. 180 tablet 1  . Multiple Vitamins-Minerals (CENTRUM SILVER PO) Take by mouth.    . peg 3350 powder (MOVIPREP) 100 G SOLR Take 1 kit (200 g total) by mouth once. 1 kit 0  . tiotropium (SPIRIVA) 18 MCG inhalation capsule Place 18 mcg into inhaler and inhale daily.        No results found for this or any previous visit (from the past 48 hour(s)). No results found.  ROS  Blood pressure 153/54, pulse 78, temperature 97.4 F (36.3 C), temperature source Oral, resp. rate 17, height _0  (1.702 m), weight 156 lb (70.761 kg), SpO2 100 %. Physical Exam  Constitutional: She appears well-developed and well-nourished.  HENT:  Mouth/Throat: Oropharynx is clear and moist.  Eyes: Conjunctivae are normal. No scleral icterus.  Neck: No thyromegaly present.  Cardiovascular: Normal rate, regular rhythm and normal heart sounds.   No murmur heard. Respiratory: Effort normal and breath sounds normal.  GI: Soft. She exhibits no distension and  no mass. There is no tenderness.  Musculoskeletal: She exhibits no edema.  Lymphadenopathy:    She has no cervical adenopathy.  Neurological: She is alert.  Skin: Skin is warm and dry.     Assessment/Plan Heme positive stool and anemia. History of colonic polyp(histology not available). Diagnostic colonoscopy.  Melva Faux U 09/30/2014, 1:22 PM

## 2014-10-01 ENCOUNTER — Other Ambulatory Visit (INDEPENDENT_AMBULATORY_CARE_PROVIDER_SITE_OTHER): Payer: Self-pay | Admitting: Internal Medicine

## 2014-10-01 ENCOUNTER — Encounter (HOSPITAL_COMMUNITY): Payer: Self-pay | Admitting: Internal Medicine

## 2014-10-01 DIAGNOSIS — R634 Abnormal weight loss: Secondary | ICD-10-CM

## 2014-10-01 DIAGNOSIS — R1031 Right lower quadrant pain: Secondary | ICD-10-CM

## 2014-10-01 DIAGNOSIS — R1032 Left lower quadrant pain: Secondary | ICD-10-CM

## 2014-10-01 DIAGNOSIS — R195 Other fecal abnormalities: Secondary | ICD-10-CM

## 2014-10-05 ENCOUNTER — Other Ambulatory Visit: Payer: Self-pay | Admitting: Cardiology

## 2014-10-07 ENCOUNTER — Ambulatory Visit (HOSPITAL_COMMUNITY)
Admission: RE | Admit: 2014-10-07 | Discharge: 2014-10-07 | Disposition: A | Discharge status: Home / Self Care | Payer: Medicare Other | Source: Ambulatory Visit | Attending: Internal Medicine | Admitting: Internal Medicine

## 2014-10-07 DIAGNOSIS — R1032 Left lower quadrant pain: Secondary | ICD-10-CM | POA: Diagnosis not present

## 2014-10-07 DIAGNOSIS — R634 Abnormal weight loss: Secondary | ICD-10-CM | POA: Diagnosis not present

## 2014-10-07 DIAGNOSIS — R195 Other fecal abnormalities: Secondary | ICD-10-CM | POA: Diagnosis not present

## 2014-10-07 DIAGNOSIS — R1031 Right lower quadrant pain: Secondary | ICD-10-CM | POA: Diagnosis present

## 2014-10-07 LAB — POCT I-STAT CREATININE: CREATININE: 1.3 mg/dL — AB (ref 0.50–1.10)

## 2014-10-07 MED ORDER — IOHEXOL 300 MG/ML  SOLN
100.0000 mL | Freq: Once | INTRAMUSCULAR | Status: AC | PRN
  Administered 2014-10-07: 80 mL via INTRAVENOUS

## 2014-10-13 ENCOUNTER — Telehealth (INDEPENDENT_AMBULATORY_CARE_PROVIDER_SITE_OTHER): Payer: Self-pay | Admitting: *Deleted

## 2014-10-13 NOTE — Telephone Encounter (Signed)
10/12/2014 12:45 (Tammy-Dr. Laural Golden) Carol Bradley  [phone] Test results  She missed Dr. Olevia Perches call over weekend

## 2014-10-26 ENCOUNTER — Ambulatory Visit (INDEPENDENT_AMBULATORY_CARE_PROVIDER_SITE_OTHER): Payer: Medicare Other | Admitting: Cardiology

## 2014-10-26 ENCOUNTER — Encounter: Payer: Self-pay | Admitting: Cardiology

## 2014-10-26 VITALS — BP 128/72 | HR 69 | Ht 67.0 in | Wt 158.0 lb

## 2014-10-26 DIAGNOSIS — E785 Hyperlipidemia, unspecified: Secondary | ICD-10-CM | POA: Diagnosis not present

## 2014-10-26 DIAGNOSIS — I10 Essential (primary) hypertension: Secondary | ICD-10-CM

## 2014-10-26 DIAGNOSIS — I2510 Atherosclerotic heart disease of native coronary artery without angina pectoris: Secondary | ICD-10-CM | POA: Diagnosis not present

## 2014-10-26 DIAGNOSIS — R1013 Epigastric pain: Secondary | ICD-10-CM

## 2014-10-26 DIAGNOSIS — I2583 Coronary atherosclerosis due to lipid rich plaque: Principal | ICD-10-CM

## 2014-10-26 NOTE — Progress Notes (Signed)
Carol Bradley Date of Birth: 18-Jan-1941 Medical Record #[id number]  History of Present Illness: Carol Bradley is seen for follow up. She has a history of COPD and mild pulmonary hypertension. She also is a history of coronary disease and is status post stenting of the mid to distal RCA in October 2012. Her presenting symptoms were of dyspnea and not chest pain. She also has labile hypertension. She developed a cough on ACE inhibitors. She developed hyponatremia on HCTZ. She has been on losartan.  On follow up today she continue to have dyspnea on exertion. This is unchanged from her prior visit. She did not follow thru with stress testing. No edema. No chest pain. No cough. Last myoview study 3/14 in [place] was normal. Symptoms similar to when she had a stent.   Current Outpatient Prescriptions on File Prior to Visit  Medication Sig Dispense Refill  . aspirin EC 81 MG tablet Take 81 mg by mouth daily.      Marland Kitchen atorvastatin (LIPITOR) 10 MG tablet TAKE ONE TABLET BY MOUTH DAILY 90 tablet 0  . cyanocobalamin 1000 MCG tablet Inject 100 mcg as directed once a week.    . diltiazem (CARDIZEM CD) 240 MG 24 hr capsule TAKE ONE CAPSULE BY MOUTH DAILY 30 capsule 5  . hyoscyamine (LEVSIN/SL) 0.125 MG SL tablet Place 1 tablet (0.125 mg total) under the tongue 3 (three) times daily as needed. 50 tablet 0  . losartan (COZAAR) 100 MG tablet TAKE ONE TABLET BY MOUTH DAILY. 90 tablet 0  . metoprolol tartrate (LOPRESSOR) 25 MG tablet Take 1 tablet (25 mg total) by mouth 2 (two) times daily. 180 tablet 1  . Multiple Vitamins-Minerals (CENTRUM SILVER PO) Take by mouth.    . tiotropium (SPIRIVA) 18 MCG inhalation capsule Place 18 mcg into inhaler and inhale daily.       No current facility-administered medications on file prior to visit.    Allergies  Allergen Reactions  . Lisinopril Cough  . Plavix [Clopidogrel Bisulfate] Swelling    Past Medical History  Diagnosis Date  . COPD (chronic obstructive pulmonary  disease)   . History of tobacco abuse   . CAD (coronary artery disease)   . Hypertension   . Hyperlipidemia     Past Surgical History  Procedure Laterality Date  . Cardiac catheterization  04/2011    right and left cath showing normal right heart pressures,but newly diagnosed coronary artery disease/drug eluting stent placed to RCA with residual disease in the proximal RCA and LAD and ramus, normal LV function and 60-65% EF  . Coronary angioplasty with stent placement  04/2011    mid RCA: 3.0 X38 mm Promus DES. Residual 40% disease proximally  . Colonoscopy N/A 09/30/2014    Procedure: COLONOSCOPY;  Surgeon: Carol Houston, MD;  Location: [facility];  Service: Endoscopy;  Laterality: N/A;  225    History  Smoking status  . Former Smoker -- 3.00 packs/day for 25 years  . Types: Cigarettes  . Quit date: 07/10/1994  Smokeless tobacco  . Never Used    History  Alcohol Use No    Family History  Problem Relation Age of Onset  . Heart failure Mother   . Cancer Father     Review of Systems: As noted in HPI. She did have a colonoscopy with benign findings. Creatinine elevated to 1.3 which is new. CT of Abdomen showed simple renal stents. All other systems were reviewed and are negative.  Physical Exam: BP 128/72  mmHg  Pulse 69  Ht 5\' 7"  (1.702 m)  Wt 158 lb (71.668 kg)  BMI 24.74 kg/m2 She is a pleasant white female in no acute distress. The HEENT exam is normal. The carotids are 2+ without bruits.  There is no thyromegaly.  There is no JVD.  The lungs are clear.   The heart exam reveals a regular rate with a normal S1 and S2.  There are no murmurs, gallops, or rubs.  The PMI is not displaced.   Abdominal exam reveals good bowel sounds.   There is no hepatosplenomegaly or tenderness.  There are no masses.  Exam of the legs reveal no clubbing, cyanosis, or edema.  The legs are without rashes.  The distal pulses are intact.  Cranial nerves II - XII are intact.  Motor and sensory  functions are intact.  The gait is normal.  LABORATORY DATA: Lab Results  Component Value Date   WBC 7.3 09/08/2014   HGB 10.3* 09/08/2014   HCT 30.9* 09/08/2014   PLT 178 09/08/2014   GLUCOSE 94 11/07/2013   CHOL 134 11/07/2013   TRIG 110.0 11/07/2013   HDL 41.90 11/07/2013   LDLCALC 70 11/07/2013   ALT 18 11/07/2013   AST 32 11/07/2013   NA 135 11/07/2013   K 3.6 11/07/2013   CL 104 11/07/2013   CREATININE 1.30* 10/07/2014   BUN 18 11/07/2013   CO2 26 11/07/2013   INR 1.04 05/05/2011   Ecg today shows NSR with mild nonspecific ST abnormality.  Assessment / Plan: 1. Coronary disease status post stenting of the RCA in October of 2012 with a drug-eluting stent.  We will continue aspirin chronically. Continue diltiazem. Now with symptoms of dyspnea on exertion similar to prior angina. I have recommended proceeding with a  stress Myoview study. If abnormal may need to consider cardiac cath. If Ok I will follow up in 6 months.  2. Hypercholesterolemia. Continue Lipitor therapy.  3. COPD with remote history of tobacco use.  4. Hypertension- well controlled currently.  5. Renal insufficiency. Creatinine 1.3. New. Needs to follow closely with primary care. If worsening should see nephrology.

## 2014-10-26 NOTE — Telephone Encounter (Signed)
Call was taken care of verbally

## 2014-10-26 NOTE — Patient Instructions (Signed)
We will schedule you for a nuclear stress test  Continue your current therapy   

## 2014-10-29 ENCOUNTER — Telehealth (HOSPITAL_COMMUNITY): Payer: Self-pay | Admitting: *Deleted

## 2014-10-30 ENCOUNTER — Encounter: Payer: Self-pay | Admitting: Cardiology

## 2014-10-30 NOTE — Telephone Encounter (Signed)
Patient called and schedule exercise myoview for 11/17/14 at 1:00 pm

## 2014-11-06 ENCOUNTER — Encounter: Payer: Self-pay | Admitting: Cardiology

## 2014-11-13 ENCOUNTER — Telehealth (HOSPITAL_COMMUNITY): Payer: Self-pay | Admitting: *Deleted

## 2014-11-13 ENCOUNTER — Telehealth (HOSPITAL_COMMUNITY): Payer: Self-pay

## 2014-11-13 NOTE — Telephone Encounter (Signed)
Encounter complete. 

## 2014-11-17 ENCOUNTER — Ambulatory Visit (HOSPITAL_COMMUNITY): Payer: Medicare Other

## 2014-12-10 ENCOUNTER — Encounter: Payer: Self-pay | Admitting: Cardiology

## 2014-12-10 ENCOUNTER — Other Ambulatory Visit: Payer: Self-pay | Admitting: Cardiology

## 2014-12-10 NOTE — Telephone Encounter (Signed)
Rx(s) sent to pharmacy electronically.  

## 2014-12-16 ENCOUNTER — Telehealth (HOSPITAL_COMMUNITY): Payer: Self-pay

## 2014-12-16 NOTE — Telephone Encounter (Signed)
Encounter complete. 

## 2014-12-17 ENCOUNTER — Ambulatory Visit (HOSPITAL_COMMUNITY)
Admission: RE | Admit: 2014-12-17 | Discharge: 2014-12-17 | Disposition: A | Discharge status: Home / Self Care | Payer: Medicare Other | Source: Ambulatory Visit | Attending: Cardiology | Admitting: Cardiology

## 2014-12-17 DIAGNOSIS — I2510 Atherosclerotic heart disease of native coronary artery without angina pectoris: Secondary | ICD-10-CM | POA: Diagnosis present

## 2014-12-17 DIAGNOSIS — I2583 Coronary atherosclerosis due to lipid rich plaque: Secondary | ICD-10-CM

## 2014-12-17 DIAGNOSIS — R1013 Epigastric pain: Secondary | ICD-10-CM | POA: Diagnosis not present

## 2014-12-17 DIAGNOSIS — I10 Essential (primary) hypertension: Secondary | ICD-10-CM | POA: Diagnosis not present

## 2014-12-17 DIAGNOSIS — E785 Hyperlipidemia, unspecified: Secondary | ICD-10-CM

## 2014-12-17 LAB — MYOCARDIAL PERFUSION IMAGING
CHL CUP NUCLEAR SDS: 4
CHL CUP NUCLEAR SSS: 8
CHL CUP RESTING HR STRESS: 90 {beats}/min
CSEPEDS: 24 s
CSEPHR: 89 %
CSEPPHR: 131 {beats}/min
Estimated workload: 5.4 METS
Exercise duration (min): 4 min
LV sys vol: 20 mL
LVDIAVOL: 59 mL
MPHR: 146 {beats}/min
NUC STRESS EF: 65 %
RPE: 23187
SRS: 4
TID: 0.8

## 2014-12-17 MED ORDER — TECHNETIUM TC 99M SESTAMIBI GENERIC - CARDIOLITE
30.9000 | Freq: Once | INTRAVENOUS | Status: AC | PRN
  Administered 2014-12-17: 30.9 via INTRAVENOUS

## 2014-12-17 MED ORDER — TECHNETIUM TC 99M SESTAMIBI GENERIC - CARDIOLITE
10.5000 | Freq: Once | INTRAVENOUS | Status: AC | PRN
  Administered 2014-12-17: 11 via INTRAVENOUS

## 2015-01-04 ENCOUNTER — Other Ambulatory Visit: Payer: Self-pay

## 2015-01-19 ENCOUNTER — Ambulatory Visit (INDEPENDENT_AMBULATORY_CARE_PROVIDER_SITE_OTHER): Payer: Medicare Other | Admitting: Urology

## 2015-01-19 DIAGNOSIS — N3020 Other chronic cystitis without hematuria: Secondary | ICD-10-CM

## 2015-01-19 DIAGNOSIS — R312 Other microscopic hematuria: Secondary | ICD-10-CM | POA: Diagnosis not present

## 2015-01-19 DIAGNOSIS — N251 Nephrogenic diabetes insipidus: Secondary | ICD-10-CM | POA: Diagnosis not present

## 2015-01-19 DIAGNOSIS — N952 Postmenopausal atrophic vaginitis: Secondary | ICD-10-CM | POA: Diagnosis not present

## 2015-01-21 ENCOUNTER — Encounter: Payer: Self-pay | Admitting: Cardiology

## 2015-01-26 ENCOUNTER — Telehealth: Payer: Self-pay | Admitting: Cardiology

## 2015-01-26 NOTE — Telephone Encounter (Signed)
Returned call to patient she wanted Dr.Jordan to know hgb 8.6.She has appointment with PCP this Friday 01/29/15.Dr.Jordan will be back in office 01/28/15 will let him know.

## 2015-01-26 NOTE — Telephone Encounter (Deleted)
Returning your call. °

## 2015-01-26 NOTE — Telephone Encounter (Signed)
Returning your call. °

## 2015-01-29 NOTE — Telephone Encounter (Signed)
Returned call to patient Dr.Jordan was told of low hgb 8.6.He advised to keep appointment with PCP.Advised needs to see a hematologist.

## 2015-02-11 ENCOUNTER — Encounter (INDEPENDENT_AMBULATORY_CARE_PROVIDER_SITE_OTHER): Payer: Self-pay | Admitting: *Deleted

## 2015-02-11 ENCOUNTER — Telehealth (INDEPENDENT_AMBULATORY_CARE_PROVIDER_SITE_OTHER): Payer: Self-pay | Admitting: *Deleted

## 2015-02-11 NOTE — Telephone Encounter (Signed)
Voice Mail left at 12:40  She needs a f/u with NUR and you can make the appt and just leave on voice mail when it is   [phone]

## 2015-02-11 NOTE — Telephone Encounter (Signed)
Apt has been scheduled for 03/02/15 at 8:30 am with Deberah Castle, NP.

## 2015-02-12 DIAGNOSIS — R779 Abnormality of plasma protein, unspecified: Secondary | ICD-10-CM | POA: Insufficient documentation

## 2015-02-12 DIAGNOSIS — D649 Anemia, unspecified: Secondary | ICD-10-CM | POA: Insufficient documentation

## 2015-02-12 DIAGNOSIS — N183 Chronic kidney disease, stage 3 unspecified: Secondary | ICD-10-CM | POA: Insufficient documentation

## 2015-02-12 DIAGNOSIS — E8809 Other disorders of plasma-protein metabolism, not elsewhere classified: Secondary | ICD-10-CM | POA: Insufficient documentation

## 2015-02-26 ENCOUNTER — Other Ambulatory Visit (HOSPITAL_COMMUNITY): Payer: Self-pay | Admitting: Internal Medicine

## 2015-02-26 DIAGNOSIS — D638 Anemia in other chronic diseases classified elsewhere: Secondary | ICD-10-CM | POA: Insufficient documentation

## 2015-02-26 DIAGNOSIS — C9000 Multiple myeloma not having achieved remission: Secondary | ICD-10-CM

## 2015-03-02 ENCOUNTER — Ambulatory Visit (INDEPENDENT_AMBULATORY_CARE_PROVIDER_SITE_OTHER): Payer: Medicare Other | Admitting: Internal Medicine

## 2015-03-05 ENCOUNTER — Other Ambulatory Visit: Payer: Self-pay | Admitting: Cardiology

## 2015-03-05 ENCOUNTER — Encounter (HOSPITAL_COMMUNITY): Payer: Self-pay

## 2015-03-05 ENCOUNTER — Ambulatory Visit (HOSPITAL_COMMUNITY)
Admission: RE | Admit: 2015-03-05 | Discharge: 2015-03-05 | Disposition: A | Discharge status: Home / Self Care | Payer: Medicare Other | Source: Ambulatory Visit | Attending: Internal Medicine | Admitting: Internal Medicine

## 2015-03-05 DIAGNOSIS — C9000 Multiple myeloma not having achieved remission: Secondary | ICD-10-CM | POA: Insufficient documentation

## 2015-03-05 DIAGNOSIS — Z79899 Other long term (current) drug therapy: Secondary | ICD-10-CM | POA: Diagnosis not present

## 2015-03-05 LAB — GLUCOSE, CAPILLARY: GLUCOSE-CAPILLARY: 93 mg/dL (ref 65–99)

## 2015-03-05 MED ORDER — FLUDEOXYGLUCOSE F - 18 (FDG) INJECTION
7.5400 | Freq: Once | INTRAVENOUS | Status: DC | PRN
  Administered 2015-03-05: 7.54 via INTRAVENOUS
  Filled 2015-03-05: qty 7.54

## 2015-04-01 DIAGNOSIS — S7001XS Contusion of right hip, sequela: Secondary | ICD-10-CM | POA: Insufficient documentation

## 2015-04-01 DIAGNOSIS — R252 Cramp and spasm: Secondary | ICD-10-CM | POA: Insufficient documentation

## 2015-04-01 DIAGNOSIS — Z5111 Encounter for antineoplastic chemotherapy: Secondary | ICD-10-CM | POA: Insufficient documentation

## 2015-04-02 DIAGNOSIS — D619 Aplastic anemia, unspecified: Secondary | ICD-10-CM | POA: Insufficient documentation

## 2015-04-12 ENCOUNTER — Ambulatory Visit (INDEPENDENT_AMBULATORY_CARE_PROVIDER_SITE_OTHER): Payer: Medicare Other | Admitting: Cardiology

## 2015-04-12 ENCOUNTER — Encounter: Payer: Self-pay | Admitting: Cardiology

## 2015-04-12 VITALS — BP 114/58 | HR 82 | Ht 67.0 in | Wt 146.8 lb

## 2015-04-12 DIAGNOSIS — C9000 Multiple myeloma not having achieved remission: Secondary | ICD-10-CM | POA: Insufficient documentation

## 2015-04-12 DIAGNOSIS — I2510 Atherosclerotic heart disease of native coronary artery without angina pectoris: Secondary | ICD-10-CM | POA: Diagnosis not present

## 2015-04-12 NOTE — Progress Notes (Signed)
Carol Bradley Date of Birth: March 09, 1941 Medical Record #[id number]  History of Present Illness: [name] is seen for follow up CAD. She has a history of COPD and mild pulmonary hypertension. She also is a history of coronary disease and is status post stenting of the mid to distal RCA in October 2012. Her presenting symptoms were of dyspnea and not chest pain. She also has labile hypertension. She developed a cough on ACE inhibitors. She developed hyponatremia on HCTZ. She has been on losartan.  On follow up today she continue to have dyspnea on exertion. She had a normal Myoview study in June. She was anemic with Hgb of 8.6. This led to hematology evaluation and bone marrow bx that demonstrated that she has Multiple myeloma. She is now being treated. Overall still grieving.   Current Outpatient Prescriptions on File Prior to Visit  Medication Sig Dispense Refill  . aspirin EC 81 MG tablet Take 81 mg by mouth daily.      Marland Kitchen atorvastatin (LIPITOR) 10 MG tablet TAKE ONE TABLET BY MOUTH DAILY. 90 tablet 2  . cyanocobalamin 1000 MCG tablet Inject 100 mcg as directed once a week.    . diltiazem (CARDIZEM CD) 240 MG 24 hr capsule TAKE ONE CAPSULE BY MOUTH DAILY 30 capsule 6  . losartan (COZAAR) 100 MG tablet TAKE ONE TABLET BY MOUTH DAILY. 90 tablet 2  . metoprolol tartrate (LOPRESSOR) 25 MG tablet TAKE ONE TABLET BY MOUTH TWICE DAILY 180 tablet 0  . Multiple Vitamins-Minerals (CENTRUM SILVER PO) Take by mouth.     No current facility-administered medications on file prior to visit.    Allergies  Allergen Reactions  . Lisinopril Cough  . Plavix [Clopidogrel Bisulfate] Swelling    Past Medical History  Diagnosis Date  . COPD (chronic obstructive pulmonary disease) ([place])   . History of tobacco abuse   . CAD (coronary artery disease)   . Hypertension   . Hyperlipidemia     Past Surgical History  Procedure Laterality Date  . Cardiac catheterization  04/2011    right and left cath  showing normal right heart pressures,but newly diagnosed coronary artery disease/drug eluting stent placed to RCA with residual disease in the proximal RCA and LAD and ramus, normal LV function and 60-65% EF  . Coronary angioplasty with stent placement  04/2011    mid RCA: 3.0 X38 mm Promus DES. Residual 40% disease proximally  . Colonoscopy N/A 09/30/2014    Procedure: COLONOSCOPY;  Surgeon: Rogene Houston, MD;  Location: [facility];  Service: Endoscopy;  Laterality: N/A;  225    History  Smoking status  . Former Smoker -- 3.00 packs/day for 25 years  . Types: Cigarettes  . Quit date: 07/10/1994  Smokeless tobacco  . Never Used    History  Alcohol Use No    Family History  Problem Relation Age of Onset  . Heart failure Mother   . Cancer Father     Review of Systems: As noted in HPI.  All other systems were reviewed and are negative.  Physical Exam: BP 114/58 mmHg  Pulse 82  Ht _0  (1.702 m)  Wt 66.588 kg (146 lb 12.8 oz)  BMI 22.99 kg/m2 She is a pleasant white female in no acute distress. The HEENT exam is normal. The carotids are 2+ without bruits.  There is no thyromegaly.  There is no JVD.  The lungs are clear.   The heart exam reveals a regular rate with a  normal S1 and S2.  There are no murmurs, gallops, or rubs.  The PMI is not displaced.   Abdominal exam reveals good bowel sounds.   There is no hepatosplenomegaly or tenderness.  There are no masses.  Exam of the legs reveal no clubbing, cyanosis, or edema.  The legs are without rashes.  The distal pulses are intact.  Cranial nerves II - XII are intact.  Motor and sensory functions are intact.  The gait is normal.  LABORATORY DATA: Lab Results  Component Value Date   WBC 7.3 09/08/2014   HGB 10.3* 09/08/2014   HCT 30.9* 09/08/2014   PLT 178 09/08/2014   GLUCOSE 94 11/07/2013   CHOL 134 11/07/2013   TRIG 110.0 11/07/2013   HDL 41.90 11/07/2013   LDLCALC 70 11/07/2013   ALT 18 11/07/2013   AST 32  11/07/2013   NA 135 11/07/2013   K 3.6 11/07/2013   CL 104 11/07/2013   CREATININE 1.30* 10/07/2014   BUN 18 11/07/2013   CO2 26 11/07/2013   INR 1.04 05/05/2011    Study Highlights    Low risk exercise myocardial perfusion study with the patient developing exercise induced 1 - 2 mm ST depression without chest pain and a workload of 5.4 mets with normal scintigraphic images without scar or ischemia. Normal LV function; EF 65%; normal wall motion.    Nuclear History and Indications    History and Indications Indication for Stress Test: Evaluation of extent and severity of coronary artery disease History: COPD and CAD;STENT/PTCA-04/2011;Last NUC MPI on 09/23/2012-norma;EF=68% Cardiac Risk Factors: History of Smoking, Hypertension, Lipids and Renal Insuff.;Mild Pulmonary HTN  Symptoms: Chest Pain, Dizziness, DOE, Fatigue, Light-Headedness, Palpitations and SOB    Stress Findings    ECG Baseline ECG is normal.  Horizontal ST segment depression ST segment depression of 2 mm was noted during stress in the inferior and inferolateral leads (II, III, aVF, V5 and V6), and returning to baseline after 5-9 minutes of recovery. ST segments became mildly downsloping in recovery  Arrhythmias during stress: none.  Arrhythmias during recovery: none.  There were no significant arrhythmias noted during the test.  ECG was interpretable.   Stress Findings The patient exercised following the Bruce protocol.     The test was stopped because the technologist observed ECG changes.   The patient reported shortness of breath during the stress test. The patient experienced no angina during the stress test.   Blood pressure and heart rate demonstrated a normal response to exercise. Heart rate demonstrated normal response to exercise. Overall, the patient's exercise capacity was moderately impaired.   Recovery time: 13 minutes.  Duke Treadmill Score: intermediate risk The patient's response to  exercise was adequate for diagnosis.    Stress Measurements    Baseline Vitals  Rest HR 90 bpm    Rest BP 147/60 mmHg    Exercise Time  Exercise duration (min) 4 min    Exercise duration (sec) 24 sec    Peak Stress Vitals  Peak HR 131 bpm    Peak BP 177/51 mmHg    Exercise Data  MPHR 146 bpm    Percent HR 89 %    RPE 23,187     Estimated workload 5.4 METS         Nuclear Stress Measurements    LV Systolic Volume 20 mL    TID 0.8     LV Diastolic Volume 59 mL    Nuc Stress EF 65 %    SSS  8     SRS 4     SDS 4            Nuclear Stress Findings    Isotope administration The isotope used for nuclear imaging was [id number]. IV Site: right forearm.Rest isotope was administered on 12/17/2014 at 12:25 with an IV injection of 10.5 mCi. Rest SPECT images were obtained approximately 45 minutes post tracer injection. Stress isotope was administered on 12/17/2014 at 13:50 with an IV injection of 30.9 mCi at peak exercise Stress SPECT images were obtained approximately 30 minutes post tracer injection.   Nuclear Study Quality There is no nuclear artifact present. Overall image quality is excellent.   Nuclear Measurements Study was gated.   Perfusion Summary Normal perfusion without scar or ischemia.    Wall Scoring    Score Index: 1.000 Percent Normal: 100.0%          The left ventricular wall motion is normal.            Signed    Electronically signed by TTroy Sine MD on 12/17/14 at 1738 EDT    Assessment / Plan: 1. Coronary disease status post stenting of the RCA in October of 2012 with a drug-eluting stent.  We will continue aspirin chronically. Continue diltiazem. Normal  stress Myoview study in June. Follow up in one year.  2. Hypercholesterolemia. Continue Lipitor therapy.  3. COPD with remote history of tobacco use.  4. Hypertension- well controlled currently.  5. Renal insufficiency. Creatinine 1.3.   6. Multiple myeloma-  per oncology.

## 2015-04-12 NOTE — Patient Instructions (Signed)
Continue your current therapy  I will see you in one year   

## 2015-05-06 DIAGNOSIS — E538 Deficiency of other specified B group vitamins: Secondary | ICD-10-CM | POA: Insufficient documentation

## 2015-05-10 DIAGNOSIS — M545 Low back pain, unspecified: Secondary | ICD-10-CM | POA: Insufficient documentation

## 2015-05-10 DIAGNOSIS — M5126 Other intervertebral disc displacement, lumbar region: Secondary | ICD-10-CM | POA: Insufficient documentation

## 2015-05-20 ENCOUNTER — Other Ambulatory Visit: Payer: Self-pay | Admitting: Cardiology

## 2015-05-20 MED ORDER — METOPROLOL TARTRATE 25 MG PO TABS: 25.0000 mg | ORAL_TABLET | Freq: Two times a day (BID) | ORAL | Status: DC

## 2015-06-19 ENCOUNTER — Other Ambulatory Visit: Payer: Self-pay | Admitting: Cardiology

## 2015-06-21 NOTE — Telephone Encounter (Signed)
Rx request sent to pharmacy.  

## 2015-07-01 ENCOUNTER — Other Ambulatory Visit: Payer: Self-pay | Admitting: Cardiology

## 2015-07-01 NOTE — Telephone Encounter (Signed)
Rx(s) sent to pharmacy electronically.  

## 2015-07-12 ENCOUNTER — Other Ambulatory Visit: Payer: Self-pay | Admitting: Cardiology

## 2015-07-13 NOTE — Telephone Encounter (Signed)
Rx request sent to pharmacy.  

## 2015-08-24 DIAGNOSIS — R159 Full incontinence of feces: Secondary | ICD-10-CM | POA: Insufficient documentation

## 2015-08-24 DIAGNOSIS — G620 Drug-induced polyneuropathy: Secondary | ICD-10-CM | POA: Insufficient documentation

## 2015-10-12 DIAGNOSIS — J439 Emphysema, unspecified: Secondary | ICD-10-CM | POA: Insufficient documentation

## 2015-11-02 DIAGNOSIS — E876 Hypokalemia: Secondary | ICD-10-CM | POA: Insufficient documentation

## 2015-11-02 DIAGNOSIS — Z8679 Personal history of other diseases of the circulatory system: Secondary | ICD-10-CM | POA: Insufficient documentation

## 2015-12-13 DIAGNOSIS — I5032 Chronic diastolic (congestive) heart failure: Secondary | ICD-10-CM | POA: Insufficient documentation

## 2016-01-10 ENCOUNTER — Other Ambulatory Visit (HOSPITAL_COMMUNITY): Payer: Self-pay | Admitting: Oncology

## 2016-01-10 DIAGNOSIS — C9000 Multiple myeloma not having achieved remission: Secondary | ICD-10-CM

## 2016-01-10 MED ORDER — LENALIDOMIDE 15 MG PO CAPS: 15.0000 mg | ORAL_CAPSULE | Freq: Every day | ORAL | Status: DC

## 2016-01-12 ENCOUNTER — Other Ambulatory Visit (HOSPITAL_COMMUNITY): Payer: Self-pay | Admitting: Oncology

## 2016-01-12 DIAGNOSIS — C9000 Multiple myeloma not having achieved remission: Secondary | ICD-10-CM

## 2016-01-12 MED ORDER — DEXAMETHASONE 4 MG PO TABS: 40.0000 mg | ORAL_TABLET | ORAL | Status: DC

## 2016-01-15 ENCOUNTER — Encounter (HOSPITAL_COMMUNITY): Payer: Medicare Other | Attending: Hematology & Oncology | Admitting: Hematology & Oncology

## 2016-01-15 ENCOUNTER — Encounter (HOSPITAL_COMMUNITY): Payer: Self-pay | Admitting: Hematology & Oncology

## 2016-01-15 VITALS — BP 114/55 | HR 88 | Temp 98.4°F | Resp 16 | Ht 64.0 in | Wt 146.4 lb

## 2016-01-15 DIAGNOSIS — N189 Chronic kidney disease, unspecified: Secondary | ICD-10-CM

## 2016-01-15 DIAGNOSIS — C9001 Multiple myeloma in remission: Secondary | ICD-10-CM | POA: Insufficient documentation

## 2016-01-15 DIAGNOSIS — C9000 Multiple myeloma not having achieved remission: Secondary | ICD-10-CM | POA: Diagnosis present

## 2016-01-15 DIAGNOSIS — I509 Heart failure, unspecified: Secondary | ICD-10-CM

## 2016-01-15 DIAGNOSIS — N183 Chronic kidney disease, stage 3 (moderate): Secondary | ICD-10-CM | POA: Diagnosis not present

## 2016-01-15 DIAGNOSIS — J449 Chronic obstructive pulmonary disease, unspecified: Secondary | ICD-10-CM

## 2016-01-15 DIAGNOSIS — D631 Anemia in chronic kidney disease: Secondary | ICD-10-CM

## 2016-01-15 DIAGNOSIS — D801 Nonfamilial hypogammaglobulinemia: Secondary | ICD-10-CM

## 2016-01-15 DIAGNOSIS — J439 Emphysema, unspecified: Secondary | ICD-10-CM | POA: Insufficient documentation

## 2016-01-15 HISTORY — DX: Nonfamilial hypogammaglobulinemia: D80.1

## 2016-01-15 NOTE — Patient Instructions (Signed)
[place] at [facility] Discharge Instructions  RECOMMENDATIONS MADE BY THE CONSULTANT AND ANY TEST RESULTS WILL BE SENT TO YOUR REFERRING PHYSICIAN.  Zometa monthly  IVIG monthly  Vitamin B12 injections monthly  Return to see Dr. Whitney Muse in 1 month with labs. You will have labs drawn on 1st floor. Come through [employer] and stop @ Information Desk to register for labs. Then come to 4th floor [place] to be seen.  Teaching sheet on Zometa given.  Thank you for choosing [place] at [facility] to provide your oncology and hematology care.  To afford each patient quality time with our provider, please arrive at least 15 minutes before your scheduled appointment time.   Beginning January 23rd 2017 lab work for the [name] will be done in the  Main lab at [facility] on 1st floor. If you have a lab appointment with the [place] please come in thru the  Main Entrance and check in at the main information desk  You need to re-schedule your appointment should you arrive 10 or more minutes late.  We strive to give you quality time with our providers, and arriving late affects you and other patients whose appointments are after yours.  Also, if you no show three or more times for appointments you may be dismissed from the clinic at the providers discretion.     Again, thank you for choosing [facility].  Our hope is that these requests will decrease the amount of time that you wait before being seen by our physicians.       _____________________________________________________________  Should you have questions after your visit to [facility], please contact our office at [phone] between the hours of 8:30 a.m. and 4:30 p.m.  Voicemails left after 4:30 p.m. will not be returned until the following business day.  For prescription refill requests, have your pharmacy contact our office.          Resources For Cancer Patients and their Caregivers ? American Cancer Society: Can assist with transportation, wigs, general needs, runs Look Good Feel Better.        220-301-1460 ? Cancer Care: Provides financial assistance, online support groups, medication/co-pay assistance.  1-800-813-HOPE (817)222-0684) ? [place] Assists [place] cancer patients and their families through emotional , educational and financial support.  (929)024-4355 ? [place] DSS Where to apply for food stamps, Medicaid and utility assistance. 2174214462 ? [facility]: Transportation to medical appointments. (531)296-8294 ? Social Security Administration: May apply for disability if have a Stage IV cancer. ([phone] 868-0807 806-409-2386 ? [employer], Disability and Transit Services: Assists with nutrition, care and transit needs. [place] Support Programs: @10RELATIVEDAYS @ > Cancer Support Group  2nd Tuesday of the month 1pm-2pm, Journey Room  > Creative Journey  3rd Tuesday of the month 1130am-1pm, Journey Room  > Look Good Feel Better  1st Wednesday of the month 10am-12 noon, Journey Room (Call American Cancer Society to register (314)[phone])  Immune Globulin Injection What is this medicine? IMMUNE GLOBULIN (im MUNE GLOB yoo lin) helps to prevent or reduce the severity of certain infections in patients who are at risk. This medicine is collected from the pooled blood of many donors. It is used to treat immune system problems, thrombocytopenia, and Kawasaki syndrome. This medicine may be used for other purposes; ask your health care provider or pharmacist if you have questions. What should I  tell my health care provider before I take this medicine? They need to know if you have any of these conditions: - diabetes - extremely low or no immune antibodies in the blood - heart disease - history of blood clots - hyperprolinemia - infection  in the blood, sepsis - kidney disease - taking medicine that may change kidney function - ask your health care provider about your medicine - an unusual or allergic reaction to human immune globulin, albumin, maltose, sucrose, polysorbate 80, other medicines, foods, dyes, or preservatives - pregnant or trying to get pregnant - breast-feeding How should I use this medicine? This medicine is for injection into a muscle or infusion into a vein or skin. It is usually given by a health care professional in a hospital or clinic setting. In rare cases, some brands of this medicine might be given at home. You will be taught how to give this medicine. Use exactly as directed. Take your medicine at regular intervals. Do not take your medicine more often than directed. Talk to your pediatrician regarding the use of this medicine in children. Special care may be needed. Overdosage: If you think you have taken too much of this medicine contact a poison control center or emergency room at once. NOTE: This medicine is only for you. Do not share this medicine with others. What if I miss a dose? It is important not to miss your dose. Call your doctor or health care professional if you are unable to keep an appointment. If you give yourself the medicine and you miss a dose, take it as soon as you can. If it is almost time for your next dose, take only that dose. Do not take double or extra doses. What may interact with this medicine? -aspirin and aspirin-like medicines -cisplatin -cyclosporine -medicines for infection like acyclovir, adefovir, amphotericin B, bacitracin, cidofovir, foscarnet, ganciclovir, gentamicin, pentamidine, vancomycin -NSAIDS, medicines for pain and inflammation, like ibuprofen or naproxen -pamidronate -vaccines -zoledronic acid This list may not describe all possible interactions. Give your health care provider a list of all the medicines, herbs, non-prescription drugs, or dietary  supplements you use. Also tell them if you smoke, drink alcohol, or use illegal drugs. Some items may interact with your medicine. What should I watch for while using this medicine? Your condition will be monitored carefully while you are receiving this medicine. This medicine is made from pooled blood donations of many different people. It may be possible to pass an infection in this medicine. However, the donors are screened for infections and all products are tested for HIV and hepatitis. The medicine is treated to kill most or all bacteria and viruses. Talk to your doctor about the risks and benefits of this medicine. Do not have vaccinations for at least 14 days before, or until at least 3 months after receiving this medicine. What side effects may I notice from receiving this medicine? Side effects that you should report to your doctor or health care professional as soon as possible: -allergic reactions like skin rash, itching or hives, swelling of the face, lips, or tongue -breathing problems -chest pain or tightness -fever, chills -headache with nausea, vomiting -neck pain or difficulty moving neck -pain when moving eyes -pain, swelling, warmth in the leg -problems with balance, talking, walking -sudden weight gain -swelling of the ankles, feet, hands -trouble passing urine or change in the amount of urine Side effects that usually do not require medical attention (report to your doctor or health care professional  if they continue or are bothersome): -dizzy, drowsy -flushing -increased sweating -leg cramps -muscle aches and pains -pain at site where injected This list may not describe all possible side effects. Call your doctor for medical advice about side effects. You may report side effects to FDA at 1-800-FDA-1088. Where should I keep my medicine? Keep out of the reach of children. This drug is usually given in a hospital or clinic and will not be stored at home. In rare  cases, some brands of this medicine may be given at home. If you are using this medicine at home, you will be instructed on how to store this medicine. Throw away any unused medicine after the expiration date on the label. NOTE: This sheet is a summary. It may not cover all possible information. If you have questions about this medicine, talk to your doctor, pharmacist, or health care provider.    2016, Elsevier/Gold Standard. (2008-09-16 11:44:49) Zoledronic Acid injection (Paget's Disease, Osteoporosis) What is this medicine? ZOLEDRONIC ACID (ZOE le dron ik AS id) lowers the amount of calcium loss from bone. It is used to treat Paget's disease and osteoporosis in women. This medicine may be used for other purposes; ask your health care provider or pharmacist if you have questions. What should I tell my health care provider before I take this medicine? They need to know if you have any of these conditions: -aspirin-sensitive asthma -cancer, especially if you are receiving medicines used to treat cancer -dental disease or wear dentures -infection -kidney disease -low levels of calcium in the blood -past surgery on the parathyroid gland or intestines -receiving corticosteroids like dexamethasone or prednisone -an unusual or allergic reaction to zoledronic acid, other medicines, foods, dyes, or preservatives -pregnant or trying to get pregnant -breast-feeding How should I use this medicine? This medicine is for infusion into a vein. It is given by a health care professional in a hospital or clinic setting. Talk to your pediatrician regarding the use of this medicine in children. This medicine is not approved for use in children. Overdosage: If you think you have taken too much of this medicine contact a poison control center or emergency room at once. NOTE: This medicine is only for you. Do not share this medicine with others. What if I miss a dose? It is important not to miss your dose.  Call your doctor or health care professional if you are unable to keep an appointment. What may interact with this medicine? -certain antibiotics given by injection -NSAIDs, medicines for pain and inflammation, like ibuprofen or naproxen -some diuretics like bumetanide, furosemide -teriparatide This list may not describe all possible interactions. Give your health care provider a list of all the medicines, herbs, non-prescription drugs, or dietary supplements you use. Also tell them if you smoke, drink alcohol, or use illegal drugs. Some items may interact with your medicine. What should I watch for while using this medicine? Visit your doctor or health care professional for regular checkups. It may be some time before you see the benefit from this medicine. Do not stop taking your medicine unless your doctor tells you to. Your doctor may order blood tests or other tests to see how you are doing. Women should inform their doctor if they wish to become pregnant or think they might be pregnant. There is a potential for serious side effects to an unborn child. Talk to your health care professional or pharmacist for more information. You should make sure that you get enough calcium and  vitamin D while you are taking this medicine. Discuss the foods you eat and the vitamins you take with your health care professional. Some people who take this medicine have severe bone, joint, and/or muscle pain. This medicine may also increase your risk for jaw problems or a broken thigh bone. Tell your doctor right away if you have severe pain in your jaw, bones, joints, or muscles. Tell your doctor if you have any pain that does not go away or that gets worse. Tell your dentist and dental surgeon that you are taking this medicine. You should not have major dental surgery while on this medicine. See your dentist to have a dental exam and fix any dental problems before starting this medicine. Take good care of your teeth  while on this medicine. Make sure you see your dentist for regular follow-up appointments. What side effects may I notice from receiving this medicine? Side effects that you should report to your doctor or health care professional as soon as possible: -allergic reactions like skin rash, itching or hives, swelling of the face, lips, or tongue -anxiety, confusion, or depression -breathing problems -changes in vision -eye pain -feeling faint or lightheaded, falls -jaw pain, especially after dental work -mouth sores -muscle cramps, stiffness, or weakness -redness, blistering, peeling or loosening of the skin, including inside the mouth -trouble passing urine or change in the amount of urine Side effects that usually do not require medical attention (report to your doctor or health care professional if they continue or are bothersome): -bone, joint, or muscle pain -constipation -diarrhea -fever -hair loss -irritation at site where injected -loss of appetite -nausea, vomiting -stomach upset -trouble sleeping -trouble swallowing -weak or tired This list may not describe all possible side effects. Call your doctor for medical advice about side effects. You may report side effects to FDA at 1-800-FDA-1088. Where should I keep my medicine? This drug is given in a hospital or clinic and will not be stored at home. NOTE: This sheet is a summary. It may not cover all possible information. If you have questions about this medicine, talk to your doctor, pharmacist, or health care provider.    2016, Elsevier/Gold Standard. (2013-11-22 14:19:57)

## 2016-01-15 NOTE — Progress Notes (Signed)
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      03/18/2015 - 07/28/2015 Chemotherapy    Velcade 1.6 mg/m2 discontinued secondary to intolerance     07/28/2015 Adverse Reaction    stool incontinence, weakness, collapse upon standing, felt to be secondary to velcade     11/09/2015 - 12/13/2015 Chemotherapy    cytoxan IV 300 mg/m2 administered X 2 doses only in addition to rev/dex      HISTORY OF PRESENTING ILLNESS:  Carol Bradley 75 y.o. female is here because of Multiple Myeloma.  She is in a wheelchair, and comes for consult today accompanied by two family members, her son Carol Bradley and his wife Corporate treasurer. Both her son and his wife are very talkative, providing information about her history.  Her family members say that it's been a rough year, "She was diagnosed on the October 25, 2022, then her husband passed on the 10-27-2022." They had been married 44 years. Confirms that her diagnosis has been a huge life change, since she's always been such an active and involved person. She worked hard on her garden and used to Psychologist, occupational in several community occupations. She was still in the garden even through this past spring, and went into the hospital with pneumonia after that.  In terms of the history of her illness, she developed anemia; was worked up at [facility], and it went on from there. Both [name] and her husband had pneumonia in January, February, March or April of 2016 (confusion according to her son); they were on the same antibiotics. Her husband bounced back, but they never could straighten it out with [name]. She spent 5 days in the hospital, and he never did.  They gave her two units of blood, and did a colonoscopy, and still couldn't figure out what was going on with her. Her son and daughter-in-law provide most of this history. [name] notes that Dr. Eulogio Ditch told her that she needed to go to a hematologist. After hearing about high protein levels, at that point, they were learning that it could be a bigger problem. Her son notes that she went to [facility] first; they still couldn't figure it out. Then that Friday, the 19th, he said she had cancer, but wasn't quite sure of the diagnosis because they hadn't done the bone marrow biopsy yet. After she received the bone marrow biopsy, they determined multiple myeloma. That's when she started on the Velcade.  She notes that she did really well on Velcade, "the numbers started coming down." She note "it was something about those that caused me to lose bowel control." She confirms developing diarrhea, but going on with it for a while because he was happy with the results she was getting number-wise. She was ultimately taken off of the Velcade and put on the Revlamid, starting at 10 mg and moving to 15. Her levels plateaud, and he wanted to put her on something to throw it over the edge. She had two treatments of chemotherapy. After the second treatment, she ended up in the hospital with respiratory failure, pneumonia, etc. She definitely had pneumonia again and received units of blood again at  that time. The first treatment cycle went fine, but the second cycle "caused all of these problems."  She notes "they give me a little bag of stuff for the bones," (zometa) and confirms that they give her B12 shots at the cancer center as well. They had a conversation with her dentist, after her tooth extraction, regarding her zometa. She has resumed zometa now, and they believe that her bone is regenerating fine after her extraction, but the patient does note that her dentist "wouldn't touch her tooth" without a send-off from Dr.  Tressie Stalker. She also notes that it's been close to two months since her last zometa dose.  [name] notes that she can tell when her hemoglobin gets low because she gets so short of breath.  She notes that she isn't having a whole lot of issues with diarrhea anymore.   Her son notes a story where she stepped wrong while putting up Christmas decorations, and broke her foot. We discussed how patients with MGUS are more prone to osteoporosis and infection, and how [name] could have had MGUS for a long time without realizing it. Her son notes that, "leading up until 2016, she could run circles around anyone." He knew something was going on because she started getting so tired.  They seem to be a close-knit family, with the patient's son Carol Bradley and his wife spending every weekend with [name]. His main concern is that she remains upbeat about her condition and feels comfortable. He notes that they even cancelled their prayer circle at the church to keep things upbeat for the patient, because everybody kept saying how "terrible" she looked instead of having a positive attitude.  She notes that she still does mammograms, but hasn't had one in close to two years. For her heart, she sees Dr. Martinique in [place], through [facility].  She is able to ambulate to the exam table with minimal assistance. During the physical exam, she denies any chest pain. She denies any pain when her abdomen is palpated. Largely, she denies complaints, but she does ask for a tissue during the examination. Her daughter in law notes that, over the past few days, [name] has had the sniffles and was treated yesterday by a [place].  The patient confirms that she's on an adult aspirin every day.  She presents today to establish ongoing care for her myeloma with recent closure of [facility]. At diagnosis available labs note an ESR greater than 140, Beta 2 microglobulin 4.6, IgG 6231 mg/dl, total protein at 10.7g/dl, albumin at 3.4  g/dl, hgb of 9.1  MEDICAL HISTORY:  Past Medical History  Diagnosis Date  . COPD (chronic obstructive pulmonary disease) ([place])   . History of tobacco abuse   . CAD (coronary artery disease)   . Hypertension   . Hyperlipidemia   . Multiple myeloma ([place])     SURGICAL HISTORY: Past Surgical History  Procedure Laterality Date  . Cardiac catheterization  04/2011    right and left cath showing normal right heart pressures,but newly diagnosed coronary artery disease/drug eluting stent placed to RCA with residual disease in the proximal RCA and LAD and ramus, normal LV function and 60-65% EF  . Coronary angioplasty with stent placement  04/2011    mid RCA: 3.0 X38 mm Promus DES. Residual 40% disease proximally  . Colonoscopy N/A 09/30/2014    Procedure: COLONOSCOPY;  Surgeon: Rogene Houston, MD;  Location: [facility];  Service: Endoscopy;  Laterality: N/A;  225    SOCIAL HISTORY: Social History   Social History  . Marital Status: Married    Spouse Name: N/A  . Number of Children: N/A  . Years of Education: N/A   Occupational History  . 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 Smoking status: Former Smoker -- 3.00 packs/day for 25 years    Types: Cigarettes    Quit date: 07/10/1994  . Smokeless tobacco: Never Used  . Alcohol Use: No  . Drug Use: Not on file  . Sexual Activity: Not on file   Other Topics Concern  . Not on file   Social History Narrative   Born in [place], [place]. Widowed; had been married 58 years.  Sister in law Carmon Sails; their right hand to be with her when they can't be here. Son Nelma Rothman and his wife Waynetta Sandy live in [place]; always here for the major stuff Only the one son, no grandchildren.  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 Hobbies include gardening in the flowers, church work, lots of volunteer work in the community Volunteered at [employer], [employer] So her diagnosis  has been a huge life change, since she was so active in the community. Loves her flower garden; has a pond with a flower in it, wind chimes, flowers.  Smoked, quit in '96. No problems with alcohol.  FAMILY HISTORY: Family History  Problem Relation Age of Onset  . Heart failure Mother   . Cancer Father    Mother would have been 49 in 09/02/22; died in 2023/06/03 She was very healthy; mind and memory really good; active until the last 4-5 months of her life. Died of congestive heart failure; had lung and breathing problems; her age was the main issue, "she just wore out." Father died at age 85. He had appendicitis. Had half of his stomach removed prior to the appendicitis; that's what started it.  5 brothers and 1 sister. 1 brother deceased with lung cancer, 2 or 3 years ago. He was a smoker. Everyone else is still living, in as good of health "as their age will allow them to be." No one else with myeloma.   ALLERGIES:  is allergic to lisinopril and plavix.  MEDICATIONS:  Current Outpatient Prescriptions  Medication Sig Dispense Refill  . acyclovir (ZOVIRAX) 200 MG capsule Take 200 mg by mouth 2 (two) times daily.    Marland Kitchen albuterol (PROVENTIL HFA;VENTOLIN HFA) 108 (90 Base) MCG/ACT inhaler Inhale 1 puff into the lungs every 6 (six) hours as needed for wheezing or shortness of breath.    Marland Kitchen aspirin EC 81 MG tablet Take 81 mg by mouth daily.      Marland Kitchen atorvastatin (LIPITOR) 10 MG tablet TAKE ONE TABLET BY MOUTH DAILY. 90 tablet 2  . Calcium Carb-Cholecalciferol (CALCIUM 600+D3 PO) Take 1 tablet by mouth 3 (three) times daily.    Marland Kitchen dexamethasone (DECADRON) 4 MG tablet Take 10 tablets (40 mg total) by mouth every 7 (seven) days. 40 tablet 3  . diazepam (VALIUM) 5 MG tablet Take 5 mg by mouth every 6 (six) hours as needed for anxiety.    Marland Kitchen diltiazem (CARDIZEM CD) 240 MG 24 hr capsule TAKE ONE CAPSULE BY MOUTH DAILY 30 capsule 11  . fluticasone (FLONASE) 50 MCG/ACT nasal spray Place 1 spray into the  nose as needed.    . furosemide (LASIX) 20 MG tablet Take 20 mg by mouth daily  as needed. For 7 days PRN swelling    . lenalidomide (REVLIMID) 15 MG capsule Take 1 capsule (15 mg total) by mouth daily. Take 15 mg PO days 1-21 every 28 days 21 capsule 0  . losartan (COZAAR) 100 MG tablet TAKE ONE TABLET BY MOUTH DAILY. 90 tablet 2  . magnesium oxide (MAG-OX) 400 MG tablet Take 400 mg by mouth daily. When taking lasix    . metoprolol tartrate (LOPRESSOR) 25 MG tablet Take 1 tablet (25 mg total) by mouth 2 (two) times daily. 180 tablet 3  . Multiple Vitamins-Minerals (CENTRUM SILVER PO) Take by mouth.    . phenylephrine (SUDAFED PE) 10 MG TABS tablet Take 10 mg by mouth as needed.    . potassium chloride (K-DUR,KLOR-CON) 10 MEQ tablet Take 10 mEq by mouth 3 (three) times daily. When taking lasix    . predniSONE (DELTASONE) 5 MG tablet Take 5 mg by mouth 2 (two) times daily with a meal. And once daily following dexamethasone    . prochlorperazine (COMPAZINE) 10 MG tablet Take 10 mg by mouth every 6 (six) hours as needed for nausea or vomiting.    Marland Kitchen Umeclidinium-Vilanterol (ANORO ELLIPTA) 62.5-25 MCG/INH AEPB Inhale 1 puff into the lungs daily.    . cyanocobalamin 1000 MCG tablet Inject 100 mcg as directed once a week.     No current facility-administered medications for this visit.    Review of Systems  Constitutional: Positive for malaise/fatigue.  HENT: Negative for congestion, ear discharge, ear pain, hearing loss, nosebleeds, sore throat and tinnitus.   Eyes: Negative.  Negative for blurred vision, double vision, photophobia, pain, discharge and redness.  Respiratory: Positive for shortness of breath. Negative for stridor.   Cardiovascular: Positive for orthopnea. Negative for chest pain and palpitations.  Gastrointestinal: Negative.  Negative for abdominal pain, blood in stool, constipation, diarrhea, heartburn, melena, nausea and vomiting.  Genitourinary: Negative.  Negative for dysuria,  flank pain, frequency, hematuria and urgency.  Musculoskeletal: Negative.  Negative for back pain, falls, joint pain, myalgias and neck pain.  Skin: Negative.  Negative for itching and rash.  Neurological: Positive for weakness. Negative for dizziness, tingling, tremors, sensory change, speech change, focal weakness, seizures, loss of consciousness and headaches.  Endo/Heme/Allergies: Negative.   Psychiatric/Behavioral: Negative.   All other systems reviewed and are negative.  14 point ROS was done and is otherwise as detailed above or in HPI    PHYSICAL EXAMINATION: ECOG PERFORMANCE STATUS: 2 - Symptomatic, <50% confined to bed  Filed Vitals:   01/15/16 1300  BP: 114/55  Pulse: 88  Temp: 98.4 F (36.9 C)  Resp: 16   Filed Weights   01/15/16 1300  Weight: 146 lb 6.4 oz (66.407 kg)     Physical Exam  Constitutional: She is oriented to person, place, and time and well-developed, well-nourished, and in no distress. No distress.  Onto exam table with assistance, presents in wheelchair  HENT:  Head: Normocephalic and atraumatic.  Mouth/Throat: Oropharynx is clear and moist. No oropharyngeal exudate.  Eyes: Conjunctivae and EOM are normal. Pupils are equal, round, and reactive to light. Right eye exhibits no discharge. Left eye exhibits no discharge. No scleral icterus.  Neck: Normal range of motion. Neck supple. No thyromegaly present.  Cardiovascular: Normal rate, regular rhythm, normal heart sounds and intact distal pulses.   No murmur heard. Pulmonary/Chest: Effort normal and breath sounds normal. No respiratory distress. She has no wheezes.  Abdominal: Soft. Bowel sounds are normal.  Musculoskeletal: Normal range of  motion. She exhibits no edema.  Lymphadenopathy:    She has no cervical adenopathy.  Neurological: She is alert and oriented to person, place, and time. No cranial nerve deficit.  Skin: Skin is warm and dry. No rash noted. She is not diaphoretic. No erythema.    Psychiatric: Mood, memory, affect and judgment normal.  Nursing note and vitals reviewed.   LABORATORY DATA:  I have reviewed the data as listed Lab Results  Component Value Date   WBC 7.3 09/08/2014   HGB 10.3* 09/08/2014   HCT 30.9* 09/08/2014   MCV 93.6 09/08/2014   PLT 178 09/08/2014   CMP     Component Value Date/Time   NA 135 11/07/2013 1444   K 3.6 11/07/2013 1444   CL 104 11/07/2013 1444   CO2 26 11/07/2013 1444   GLUCOSE 94 11/07/2013 1444   BUN 18 11/07/2013 1444   CREATININE 1.30* 10/07/2014 1519   CALCIUM 8.8 11/07/2013 1444   PROT 8.4* 11/07/2013 1444   ALBUMIN 3.1* 11/07/2013 1444   AST 32 11/07/2013 1444   ALT 18 11/07/2013 1444   ALKPHOS 65 11/07/2013 1444   BILITOT 0.5 11/07/2013 1444   GFRNONAA 57* 05/06/2011 0500   GFRAA 66* 05/06/2011 0500              RADIOGRAPHIC STUDIES: I have personally reviewed the radiological images as listed and agreed with the findings in the report. CLINICAL DATA:  Initial treatment strategy for multiple myeloma.  EXAM: NUCLEAR MEDICINE PET WHOLE BODY  TECHNIQUE: 7.54 mCi F-18 FDG was injected intravenously. Full-ring PET imaging was performed from the vertex to the feet after the radiotracer. CT data was obtained and used for attenuation correction and anatomic localization.  FASTING BLOOD GLUCOSE:  Value:  93 mg/dl  COMPARISON:  Abdominal pelvic CT 10/07/2014.  FINDINGS: NECK  No hypermetabolic cervical lymph nodes are identified.There are no lesions of the pharyngeal mucosal space. Carotid atherosclerosis noted bilaterally.  CHEST  There are no hypermetabolic mediastinal, hilar or axillary lymph nodes. There is no suspicious pulmonary activity. Moderate emphysematous changes are present throughout the lungs. There is atherosclerosis of the aorta, great vessels and coronary arteries.  ABDOMEN/PELVIS  There is no hypermetabolic activity within the liver, adrenal glands, spleen  or pancreas. There is no hypermetabolic nodal activity. There is a left renal cysts, aortoiliac atherosclerosis and sigmoid diverticulosis.  SKELETON  There is no hypermetabolic activity to suggest osseous metastatic disease. There are mottled lucencies throughout the axial skeleton without focal lytic lesion or pathologic fracture. Lower cervical fusion noted.  Extremities: No hypermetabolic activity to suggest metastasis. There is fatty atrophy within left lower leg gastrocnemius and soleus musculature.  IMPRESSION: 1. No evidence of metabolically active multiple myeloma in the neck, chest, abdomen, pelvis or extremities. 2. No other acute or significant findings. Chronic lung disease and atherosclerosis as described.   Electronically Signed   By: Carey Bullocks M.D.   On: 03/05/2015 14:59   ASSESSMENT & PLAN:  IgG kappa Myeloma Anemia Fatigue Hypogammaglobulenemia Stage 3 CKD B12 deficiency Marked elevation of ESR Stool incontinence on Velcade Severe COPD from prior tobacco abuse Diastolic CHF  I reviewed her last myeloma labs with the patient and her family. There was no evidence of an M-spike. She has had problems with recurrent infections. We discussed several issues. 1. Revlimid/dex is certainly a reasonable regimen for her given her comorbidities and difficulties with prior regimens. Doublet regimens are appropriate for frail or elderly patients. We will follow her  myeloma studies closely. 2. If they desire we can refer her to Dr. Tiana Loft at [facility] in the future 3. Given problems with pneumonia and her low IgG I would institute IVIG once monthly 4. Continue B12 5. Reinstitute zometa at some point in the future. We will obtain her MAR from [facility] to see when her last dose was given.  6. Continue with current dose revlimid at 15 mg given renal dysfunction. Would increase aspirin to 325 mg from 81 mg  7. Question institution on renal dosing of aranesp if  anemia continues to be problematic moving forward  I discussed with her son that a listed side effect of revlimid is pneumonia however she had pneumonia prior to ever starting the drug which I feel is related more to her immunocompromised state.  Once she's recovered a little more, we will work her in for a mammogram as well.  I will send her home today with some books and other reading [place] on myeloma. When she returns, we will need to discuss obtaining ongoing Revlimid refills.  All questions were answered. The patient knows to call the clinic with any problems, questions or concerns.  This document serves as a record of services personally performed by Ancil Linsey, MD. It was created on her behalf by Toni Amend, a trained medical scribe. The creation of this record is based on the scribe's personal observations and the provider's statements to them. This document has been checked and approved by the attending provider.  I have reviewed the above documentation for accuracy and completeness and I agree with the above.  This note was electronically signed.  Molli Hazard, MD  01/15/2016 2:20 PM

## 2016-01-20 ENCOUNTER — Encounter (HOSPITAL_COMMUNITY): Payer: Medicare Other

## 2016-01-20 ENCOUNTER — Ambulatory Visit (HOSPITAL_COMMUNITY): Payer: Medicare Other

## 2016-01-20 ENCOUNTER — Encounter (HOSPITAL_BASED_OUTPATIENT_CLINIC_OR_DEPARTMENT_OTHER): Payer: Medicare Other

## 2016-01-20 ENCOUNTER — Other Ambulatory Visit (HOSPITAL_COMMUNITY): Payer: Self-pay | Admitting: Oncology

## 2016-01-20 VITALS — BP 114/46 | HR 71 | Temp 97.9°F | Resp 16 | Wt 148.4 lb

## 2016-01-20 DIAGNOSIS — C9000 Multiple myeloma not having achieved remission: Secondary | ICD-10-CM

## 2016-01-20 DIAGNOSIS — C9001 Multiple myeloma in remission: Secondary | ICD-10-CM

## 2016-01-20 DIAGNOSIS — D801 Nonfamilial hypogammaglobulinemia: Secondary | ICD-10-CM

## 2016-01-20 DIAGNOSIS — E538 Deficiency of other specified B group vitamins: Secondary | ICD-10-CM | POA: Diagnosis not present

## 2016-01-20 LAB — COMPREHENSIVE METABOLIC PANEL
ALBUMIN: 2.9 g/dL — AB (ref 3.5–5.0)
ALT: 49 U/L (ref 14–54)
AST: 18 U/L (ref 15–41)
Alkaline Phosphatase: 85 U/L (ref 38–126)
Anion gap: 8 (ref 5–15)
BUN: 30 mg/dL — AB (ref 6–20)
CHLORIDE: 107 mmol/L (ref 101–111)
CO2: 26 mmol/L (ref 22–32)
CREATININE: 0.99 mg/dL (ref 0.44–1.00)
Calcium: 8.4 mg/dL — ABNORMAL LOW (ref 8.9–10.3)
GFR calc Af Amer: >60 mL/min (ref >60)
GFR calc non Af Amer: 54 mL/min — ABNORMAL LOW (ref >60)
GLUCOSE: 137 mg/dL — AB (ref 65–99)
Potassium: 3.7 mmol/L (ref 3.5–5.1)
SODIUM: 141 mmol/L (ref 135–145)
Total Bilirubin: 0.5 mg/dL (ref 0.3–1.2)
Total Protein: 5.9 g/dL — ABNORMAL LOW (ref 6.5–8.1)

## 2016-01-20 LAB — CBC WITH DIFFERENTIAL/PLATELET
BASOS ABS: 0 10*3/uL (ref 0.0–0.1)
BASOS PCT: 0 %
EOS PCT: 0 %
Eosinophils Absolute: 0 10*3/uL (ref 0.0–0.7)
HCT: 27.5 % — ABNORMAL LOW (ref 36.0–46.0)
Hemoglobin: 9.1 g/dL — ABNORMAL LOW (ref 12.0–15.0)
LYMPHS PCT: 12 %
Lymphs Abs: 1.9 10*3/uL (ref 0.7–4.0)
MCH: 30.8 pg (ref 26.0–34.0)
MCHC: 33.1 g/dL (ref 30.0–36.0)
MCV: 93.2 fL (ref 78.0–100.0)
Monocytes Absolute: 0.3 10*3/uL (ref 0.1–1.0)
Monocytes Relative: 2 %
Neutro Abs: 14.3 10*3/uL — ABNORMAL HIGH (ref 1.7–7.7)
Neutrophils Relative %: 86 %
PLATELETS: 196 10*3/uL (ref 150–400)
RBC: 2.95 MIL/uL — AB (ref 3.87–5.11)
RDW: 17.6 % — ABNORMAL HIGH (ref 11.5–15.5)
WBC: 16.5 10*3/uL — AB (ref 4.0–10.5)

## 2016-01-20 LAB — C-REACTIVE PROTEIN: CRP: 0.6 mg/dL (ref <1.0)

## 2016-01-20 MED ORDER — ZOLEDRONIC ACID 4 MG/5ML IV CONC
4.0000 mg | Freq: Once | INTRAVENOUS | Status: AC
  Administered 2016-01-20: 4 mg via INTRAVENOUS
  Filled 2016-01-20: qty 5

## 2016-01-20 MED ORDER — DIPHENHYDRAMINE HCL 25 MG PO CAPS
25.0000 mg | ORAL_CAPSULE | Freq: Once | ORAL | Status: AC
  Administered 2016-01-20: 25 mg via ORAL

## 2016-01-20 MED ORDER — DIPHENHYDRAMINE HCL 25 MG PO TABS
25.0000 mg | ORAL_TABLET | ORAL | Status: DC | PRN
Start: 2016-01-20 — End: 2016-01-20
  Filled 2016-01-20: qty 1

## 2016-01-20 MED ORDER — CYANOCOBALAMIN 1000 MCG/ML IJ SOLN
1000.0000 ug | Freq: Once | INTRAMUSCULAR | Status: AC
  Administered 2016-01-20: 1000 ug via INTRAMUSCULAR
  Filled 2016-01-20: qty 1

## 2016-01-20 MED ORDER — DIPHENHYDRAMINE HCL 25 MG PO CAPS
ORAL_CAPSULE | ORAL | Status: AC
  Filled 2016-01-20: qty 1

## 2016-01-20 MED ORDER — ACETAMINOPHEN 325 MG PO TABS
ORAL_TABLET | ORAL | Status: AC
  Filled 2016-01-20: qty 2

## 2016-01-20 MED ORDER — IMMUNE GLOBULIN (HUMAN) 10 GM/100ML IV SOLN
600.0000 mg/kg | Freq: Once | INTRAVENOUS | Status: AC
  Administered 2016-01-20: 40 g via INTRAVENOUS
  Filled 2016-01-20: qty 400

## 2016-01-20 MED ORDER — ACETAMINOPHEN 325 MG PO TABS
650.0000 mg | ORAL_TABLET | Freq: Four times a day (QID) | ORAL | Status: DC | PRN
  Administered 2016-01-20: 650 mg via ORAL

## 2016-01-20 NOTE — Progress Notes (Signed)
See infusion encounter.  

## 2016-01-20 NOTE — Progress Notes (Signed)
Corrected calcium 9.28  Tolerated privigen infusion well. Tolerated zometa infusion well.  Carol Bradley presents today for injection per MD orders. B12 1000 mcg administered IM in right Upper Arm. Administration without incident. Patient tolerated well.  Stable on discharge home with son via wheelchair.

## 2016-01-20 NOTE — Patient Instructions (Signed)
[place] at [facility] Discharge Instructions  RECOMMENDATIONS MADE BY THE CONSULTANT AND ANY TEST RESULTS WILL BE SENT TO YOUR REFERRING PHYSICIAN.  Today you received IVIG infusion and Zometa infusion. You also received Vitamin B12 injection. Return as scheduled.  Thank you for choosing [facility] at [facility] to provide your oncology and hematology care.  To afford each patient quality time with our provider, please arrive at least 15 minutes before your scheduled appointment time.   Beginning January 23rd 2017 lab work for the [name] will be done in the  Main lab at [facility] on 1st floor. If you have a lab appointment with the [facility] please come in thru the  Main Entrance and check in at the main information desk  You need to re-schedule your appointment should you arrive 10 or more minutes late.  We strive to give you quality time with our providers, and arriving late affects you and other patients whose appointments are after yours.  Also, if you no show three or more times for appointments you may be dismissed from the clinic at the providers discretion.     Again, thank you for choosing [facility]  Our hope is that these requests will decrease the amount of time that you wait before being seen by our physicians.       _____________________________________________________________  Should you have questions after your visit to [facility], please contact our office at [phone] between the hours of 8:30 a.m. and 4:30 p.m.  Voicemails left after 4:30 p.m. will not be returned until the following business day.  For prescription refill requests, have your pharmacy contact our office.         Resources For Cancer Patients and their Caregivers ? American Cancer Society: Can assist with transportation, wigs, general needs, runs Look Good Feel Better.        (701)209-9848 ? Cancer  Care: Provides financial assistance, online support groups, medication/co-pay assistance.  1-800-813-HOPE (470)631-4488) ? [facility] Assists [place] cancer patients and their families through emotional , educational and financial support.  (516)018-2137 ? [place] DSS Where to apply for food stamps, Medicaid and utility assistance. (321) 180-1062 ? [facility]: Transportation to medical appointments. 417-186-0724 ? Social Security Administration: May apply for disability if have a Stage IV cancer. (252)404-2770 702 420 1131 ? [employer], Disability and Transit Services: Assists with nutrition, care and transit needs. [place] Support Programs: @10RELATIVEDAYS @ > Cancer Support Group  2nd Tuesday of the month 1pm-2pm, Journey Room  > Creative Journey  3rd Tuesday of the month 1130am-1pm, Journey Room  > Look Good Feel Better  1st Wednesday of the month 10am-12 noon, Journey Room (Call [place] to register [phone])

## 2016-01-21 ENCOUNTER — Other Ambulatory Visit (HOSPITAL_COMMUNITY): Payer: Self-pay | Admitting: Oncology

## 2016-01-21 DIAGNOSIS — C9001 Multiple myeloma in remission: Secondary | ICD-10-CM

## 2016-01-21 LAB — KAPPA/LAMBDA LIGHT CHAINS
Kappa free light chain: 17.6 mg/L (ref 3.3–19.4)
Kappa, lambda light chain ratio: 0.85 (ref 0.26–1.65)
Lambda free light chains: 20.7 mg/L (ref 5.7–26.3)

## 2016-01-21 LAB — IGG, IGA, IGM
IGA: 128 mg/dL (ref 64–422)
IGG (IMMUNOGLOBIN G), SERUM: 492 mg/dL — AB (ref 700–1600)
IgM, Serum: 117 mg/dL (ref 26–217)

## 2016-01-21 LAB — BETA 2 MICROGLOBULIN, SERUM: Beta-2 Microglobulin: 2.5 mg/L — ABNORMAL HIGH (ref 0.6–2.4)

## 2016-01-21 MED ORDER — ACYCLOVIR 200 MG PO CAPS: 200.0000 mg | ORAL_CAPSULE | Freq: Two times a day (BID) | ORAL | Status: DC

## 2016-01-24 LAB — IMMUNOFIXATION ELECTROPHORESIS
IGA: 128 mg/dL (ref 64–422)
IGG (IMMUNOGLOBIN G), SERUM: 489 mg/dL — AB (ref 700–1600)
IGM, SERUM: 112 mg/dL (ref 26–217)
Total Protein ELP: 5.3 g/dL — ABNORMAL LOW (ref 6.0–8.5)

## 2016-01-24 LAB — PROTEIN ELECTROPHORESIS, SERUM
A/G Ratio: 1 (ref 0.7–1.7)
Albumin ELP: 2.6 g/dL — ABNORMAL LOW (ref 2.9–4.4)
Alpha-1-Globulin: 0.3 g/dL (ref 0.0–0.4)
Alpha-2-Globulin: 1 g/dL (ref 0.4–1.0)
Beta Globulin: 0.8 g/dL (ref 0.7–1.3)
GAMMA GLOBULIN: 0.5 g/dL (ref 0.4–1.8)
GLOBULIN, TOTAL: 2.6 g/dL (ref 2.2–3.9)
TOTAL PROTEIN ELP: 5.2 g/dL — AB (ref 6.0–8.5)

## 2016-01-29 ENCOUNTER — Other Ambulatory Visit (HOSPITAL_COMMUNITY)
Admission: RE | Admit: 2016-01-29 | Discharge: 2016-01-29 | Disposition: A | Discharge status: Home / Self Care | Payer: Medicare Other | Source: Other Acute Inpatient Hospital | Attending: Internal Medicine | Admitting: Internal Medicine

## 2016-01-29 DIAGNOSIS — N390 Urinary tract infection, site not specified: Secondary | ICD-10-CM | POA: Insufficient documentation

## 2016-01-29 LAB — URINE MICROSCOPIC-ADD ON

## 2016-01-29 LAB — URINALYSIS, ROUTINE W REFLEX MICROSCOPIC
Bilirubin Urine: NEGATIVE
GLUCOSE, UA: NEGATIVE mg/dL
KETONES UR: NEGATIVE mg/dL
Nitrite: NEGATIVE
PH: 6.5 (ref 5.0–8.0)
PROTEIN: 100 mg/dL — AB
Specific Gravity, Urine: 1.02 (ref 1.005–1.030)

## 2016-01-31 LAB — URINE CULTURE

## 2016-02-01 ENCOUNTER — Encounter (HOSPITAL_COMMUNITY): Payer: Self-pay

## 2016-02-03 ENCOUNTER — Other Ambulatory Visit (HOSPITAL_COMMUNITY): Payer: Self-pay | Admitting: Oncology

## 2016-02-03 DIAGNOSIS — C9000 Multiple myeloma not having achieved remission: Secondary | ICD-10-CM

## 2016-02-03 MED ORDER — LENALIDOMIDE 15 MG PO CAPS: 15.0000 mg | ORAL_CAPSULE | Freq: Every day | ORAL | 0 refills | Status: DC

## 2016-02-04 ENCOUNTER — Telehealth (HOSPITAL_COMMUNITY): Payer: Self-pay | Admitting: *Deleted

## 2016-02-04 ENCOUNTER — Encounter (HOSPITAL_COMMUNITY): Payer: Self-pay | Admitting: *Deleted

## 2016-02-09 ENCOUNTER — Encounter (HOSPITAL_COMMUNITY): Payer: Medicare Other

## 2016-02-12 ENCOUNTER — Encounter (HOSPITAL_COMMUNITY): Payer: Self-pay | Admitting: Hematology & Oncology

## 2016-02-16 ENCOUNTER — Other Ambulatory Visit (HOSPITAL_COMMUNITY): Payer: Self-pay

## 2016-02-16 DIAGNOSIS — C9001 Multiple myeloma in remission: Secondary | ICD-10-CM

## 2016-02-17 ENCOUNTER — Encounter (HOSPITAL_COMMUNITY): Payer: Medicare Other

## 2016-02-17 ENCOUNTER — Encounter (HOSPITAL_BASED_OUTPATIENT_CLINIC_OR_DEPARTMENT_OTHER): Payer: Medicare Other

## 2016-02-17 ENCOUNTER — Other Ambulatory Visit: Payer: Self-pay | Admitting: Adult Health

## 2016-02-17 ENCOUNTER — Encounter (HOSPITAL_COMMUNITY): Payer: Medicare Other | Attending: Adult Health | Admitting: Adult Health

## 2016-02-17 VITALS — BP 135/48 | HR 86 | Temp 98.0°F | Resp 16 | Wt 149.5 lb

## 2016-02-17 VITALS — BP 124/52 | HR 85 | Temp 98.2°F | Resp 16

## 2016-02-17 DIAGNOSIS — C9001 Multiple myeloma in remission: Secondary | ICD-10-CM

## 2016-02-17 DIAGNOSIS — D801 Nonfamilial hypogammaglobulinemia: Secondary | ICD-10-CM

## 2016-02-17 DIAGNOSIS — N390 Urinary tract infection, site not specified: Secondary | ICD-10-CM

## 2016-02-17 DIAGNOSIS — D649 Anemia, unspecified: Secondary | ICD-10-CM

## 2016-02-17 DIAGNOSIS — C9000 Multiple myeloma not having achieved remission: Secondary | ICD-10-CM

## 2016-02-17 DIAGNOSIS — N183 Chronic kidney disease, stage 3 (moderate): Secondary | ICD-10-CM

## 2016-02-17 DIAGNOSIS — Z029 Encounter for administrative examinations, unspecified: Secondary | ICD-10-CM | POA: Insufficient documentation

## 2016-02-17 DIAGNOSIS — Z1231 Encounter for screening mammogram for malignant neoplasm of breast: Secondary | ICD-10-CM

## 2016-02-17 DIAGNOSIS — E538 Deficiency of other specified B group vitamins: Secondary | ICD-10-CM | POA: Diagnosis not present

## 2016-02-17 LAB — COMPREHENSIVE METABOLIC PANEL
ALBUMIN: 3 g/dL — AB (ref 3.5–5.0)
ALK PHOS: 81 U/L (ref 38–126)
ALT: 98 U/L — ABNORMAL HIGH (ref 14–54)
ANION GAP: 8 (ref 5–15)
AST: 72 U/L — ABNORMAL HIGH (ref 15–41)
BUN: 26 mg/dL — ABNORMAL HIGH (ref 6–20)
CALCIUM: 7.4 mg/dL — AB (ref 8.9–10.3)
CHLORIDE: 108 mmol/L (ref 101–111)
CO2: 24 mmol/L (ref 22–32)
Creatinine, Ser: 0.99 mg/dL (ref 0.44–1.00)
GFR calc non Af Amer: 54 mL/min — ABNORMAL LOW (ref >60)
GLUCOSE: 154 mg/dL — AB (ref 65–99)
Potassium: 4.3 mmol/L (ref 3.5–5.1)
SODIUM: 140 mmol/L (ref 135–145)
Total Bilirubin: 0.5 mg/dL (ref 0.3–1.2)
Total Protein: 6 g/dL — ABNORMAL LOW (ref 6.5–8.1)

## 2016-02-17 LAB — CBC WITH DIFFERENTIAL/PLATELET
BASOS PCT: 0 %
Basophils Absolute: 0 10*3/uL (ref 0.0–0.1)
EOS ABS: 0 10*3/uL (ref 0.0–0.7)
EOS PCT: 0 %
HCT: 34 % — ABNORMAL LOW (ref 36.0–46.0)
HEMOGLOBIN: 11.1 g/dL — AB (ref 12.0–15.0)
Lymphocytes Relative: 5 %
Lymphs Abs: 0.5 10*3/uL — ABNORMAL LOW (ref 0.7–4.0)
MCH: 30.6 pg (ref 26.0–34.0)
MCHC: 32.6 g/dL (ref 30.0–36.0)
MCV: 93.7 fL (ref 78.0–100.0)
Monocytes Absolute: 0.2 10*3/uL (ref 0.1–1.0)
Monocytes Relative: 2 %
NEUTROS PCT: 93 %
Neutro Abs: 10.3 10*3/uL — ABNORMAL HIGH (ref 1.7–7.7)
PLATELETS: 228 10*3/uL (ref 150–400)
RBC: 3.63 MIL/uL — AB (ref 3.87–5.11)
RDW: 18.2 % — ABNORMAL HIGH (ref 11.5–15.5)
WBC: 11 10*3/uL — AB (ref 4.0–10.5)

## 2016-02-17 LAB — C-REACTIVE PROTEIN: CRP: 0.6 mg/dL (ref <1.0)

## 2016-02-17 LAB — LACTATE DEHYDROGENASE: LDH: 274 U/L — AB (ref 98–192)

## 2016-02-17 MED ORDER — CYANOCOBALAMIN 1000 MCG/ML IJ SOLN
1000.0000 ug | Freq: Once | INTRAMUSCULAR | Status: AC
  Administered 2016-02-17: 1000 ug via INTRAMUSCULAR

## 2016-02-17 MED ORDER — DEXTROSE 5 % IV SOLN
INTRAVENOUS | Status: DC
Start: 2016-02-17 — End: 2016-02-17
  Administered 2016-02-17: 09:00:00 via INTRAVENOUS

## 2016-02-17 MED ORDER — SODIUM CHLORIDE 0.9 % IV SOLN
4.0000 mg | Freq: Once | INTRAVENOUS | Status: DC
  Filled 2016-02-17: qty 5

## 2016-02-17 MED ORDER — ACETAMINOPHEN 325 MG PO TABS
650.0000 mg | ORAL_TABLET | Freq: Four times a day (QID) | ORAL | Status: DC | PRN
  Administered 2016-02-17: 650 mg via ORAL

## 2016-02-17 MED ORDER — ZOLEDRONIC ACID 4 MG/100ML IV SOLN
4.0000 mg | Freq: Once | INTRAVENOUS | Status: DC
  Filled 2016-02-17: qty 100

## 2016-02-17 MED ORDER — ACETAMINOPHEN 325 MG PO TABS
ORAL_TABLET | ORAL | Status: AC
  Filled 2016-02-17: qty 2

## 2016-02-17 MED ORDER — DIPHENHYDRAMINE HCL 25 MG PO CAPS
25.0000 mg | ORAL_CAPSULE | ORAL | Status: AC | PRN
  Administered 2016-02-17: 25 mg via ORAL
  Filled 2016-02-17: qty 1

## 2016-02-17 MED ORDER — ZOLEDRONIC ACID 4 MG/5ML IV CONC: 4.0000 mg | Freq: Once | INTRAVENOUS | Status: DC

## 2016-02-17 MED ORDER — CYANOCOBALAMIN 1000 MCG/ML IJ SOLN
INTRAMUSCULAR | Status: AC
  Filled 2016-02-17: qty 1

## 2016-02-17 MED ORDER — DIPHENHYDRAMINE HCL 25 MG PO CAPS
ORAL_CAPSULE | ORAL | Status: AC
  Filled 2016-02-17: qty 1

## 2016-02-17 MED ORDER — IMMUNE GLOBULIN (HUMAN) 10 GM/100ML IV SOLN
600.0000 mg/kg | Freq: Once | INTRAVENOUS | Status: AC
  Administered 2016-02-17: 40 g via INTRAVENOUS
  Filled 2016-02-17: qty 400

## 2016-02-17 NOTE — Progress Notes (Signed)
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      03/18/2015 - 07/28/2015 Chemotherapy    Velcade 1.6 mg/m2 discontinued secondary to intolerance     07/28/2015 Adverse Reaction    stool incontinence, weakness, collapse upon standing, felt to be secondary to velcade     11/09/2015 - 12/13/2015 Chemotherapy    cytoxan IV 300 mg/m2 administered X 2 doses only in addition to rev/dex      HISTORY OF PRESENTING ILLNESS:  (copied from original consult visit on 01/15/16) Carol Bradley 75 y.o. female is here because of Multiple Myeloma.  She is in a wheelchair, and comes for consult today accompanied by two family members, her son Carol Bradley and his wife Corporate treasurer. Both her son and his wife are very talkative, providing information about her history.  Her family members say that it's been a rough year, "She was diagnosed on the October 04, 2022, then her husband passed on the 10/06/2022." They had been married 44 years. Confirms that her diagnosis has been a huge life change, since she's always been such an active and involved person. She worked hard on her garden and used to Psychologist, occupational in several community occupations. She was still in the garden even through this past spring, and went into the hospital with pneumonia after that.  In terms of the history of her illness, she developed anemia; was worked up at [facility], and it went on from there. Both Carol Bradley and her husband had pneumonia in January, February, March or April of 2016 (confusion according to her son); they were on the same antibiotics. Her husband bounced back, but they never could straighten it out with Carol Bradley. She spent 5  days in the hospital, and he never did. They gave her two units of blood, and did a colonoscopy, and still couldn't figure out what was going on with her. Her son and daughter-in-law provide most of this history. Carol Bradley notes that Dr. Eulogio Ditch told her that she needed to go to a hematologist. After hearing about high protein levels, at that point, they were learning that it could be a bigger problem. Her son notes that she went to [facility] first; they still couldn't figure it out. Then that Friday, the 19th, he said she had cancer, but wasn't quite sure of the diagnosis because they hadn't done the bone marrow biopsy yet. After she received the bone marrow biopsy, they determined multiple myeloma. That's when she started on the Velcade.  She notes that she did really well on Velcade, "the numbers started coming down." She note "it was something about those that caused me to lose bowel control." She confirms developing diarrhea, but going on with it for a while because he was happy with the results she was getting number-wise. She was ultimately taken off of the Velcade and put on the Revlamid, starting at 10 mg and moving to 15. Her levels plateaud, and he wanted to put her on something to throw it over the edge. She had two treatments of chemotherapy. After the second treatment, she ended up in the hospital with respiratory failure, pneumonia, etc. She definitely had pneumonia again  and received units of blood again at that time. The first treatment cycle went fine, but the second cycle "caused all of these problems."  She notes "they give me a little bag of stuff for the bones," (zometa) and confirms that they give her B12 shots at the cancer center as well. They had a conversation with her dentist, after her tooth extraction, regarding her zometa. She has resumed zometa now, and they believe that her bone is regenerating fine after her extraction, but the patient does note that her dentist "wouldn't touch  her tooth" without a send-off from Dr. Tressie Stalker. She also notes that it's been close to two months since her last zometa dose.  Carol Bradley notes that she can tell when her hemoglobin gets low because she gets so short of breath.  She notes that she isn't having a whole lot of issues with diarrhea anymore.   Her son notes a story where she stepped wrong while putting up Christmas decorations, and broke her foot. We discussed how patients with MGUS are more prone to osteoporosis and infection, and how [name] could have had MGUS for a long time without realizing it. Her son notes that, "leading up until 2016, she could run circles around anyone." He knew something was going on because she started getting so tired.  They seem to be a close-knit family, with the patient's son Carol Bradley and his wife spending every weekend with Carol Bradley. His main concern is that she remains upbeat about her condition and feels comfortable. He notes that they even cancelled their prayer circle at the church to keep things upbeat for the patient, because everybody kept saying how "terrible" she looked instead of having a positive attitude.  She notes that she still does mammograms, but hasn't had one in close to two years. For her heart, she sees Dr. Martinique in [place], through [place] [facility].  She is able to ambulate to the exam table with minimal assistance. During the physical exam, she denies any chest pain. She denies any pain when her abdomen is palpated. Largely, she denies complaints, but she does ask for a tissue during the examination. Her daughter in law notes that, over the past few days, Carol Bradley has had the sniffles and was treated yesterday by a [facility].  The patient confirms that she's on an adult aspirin every day.  She presents today to establish ongoing care for her myeloma with recent closure of [facility]. At diagnosis available labs note an ESR greater than 140, Beta 2 microglobulin 4.6, IgG 6231 mg/dl,  total protein at 10.7g/dl, albumin at 3.4 g/dl, hgb of 9.1   INTERVAL HISTORY (02/17/16): Mrs. Kasperski returns to the cancer center today accompanied by her son.  Her son begins speaking about her low blood counts, requiring transfusion "a week or so ago." He notes that he doesn't think she can "get on this thing" once a month "until we figure out where this blood is going." Her son is visibly frustrated today. He notes that his mother knows when she starts feeling sluggish and bad; that she starts getting weak and feeling bad "when it falls below that 9 mark." He notes that her breathing gets labored and it's a scary process. In addition to this, he mentions frustration with the care his mother is receiving, and mostly frustration with the time between follow-ups. He says "within 3 weeks, it went from 9.1 to 8.2." He notes that he feels his mother spent too much time  miserable during this period. He says "I don't think we have time to wait 30 days between appointments."    Mrs. Voisin herself notes that, today, she feels pretty good. She was advised that her hemoglobin today is 11.1.  She is here today for IVIG, Zometa, & B12.   During the physical exam, the patient largely denies complaints. She denies chest pains, heart flutters, and denies any new shortness of breath. She confirms that her bowels are moving okay, and denies any constipation or diarrhea.  MEDICAL HISTORY:  Past Medical History:  Diagnosis Date  . CAD (coronary artery disease)   . COPD (chronic obstructive pulmonary disease) ([place])   . History of tobacco abuse   . Hyperlipidemia   . Hypertension   . Multiple myeloma ([place])     SURGICAL HISTORY: Past Surgical History:  Procedure Laterality Date  . CARDIAC CATHETERIZATION  04/2011   right and left cath showing normal right heart pressures,but newly diagnosed coronary artery disease/drug eluting stent placed to RCA with residual disease in the proximal RCA and LAD and ramus, normal  LV function and 60-65% EF  . COLONOSCOPY N/A 09/30/2014   Procedure: COLONOSCOPY;  Surgeon: Rogene Houston, MD;  Location: [facility];  Service: Endoscopy;  Laterality: N/A;  225  . CORONARY ANGIOPLASTY WITH STENT PLACEMENT  04/2011   mid RCA: 3.0 X38 mm Promus DES. Residual 40% disease proximally    SOCIAL HISTORY: Social History   Social History  . Marital status: Married    Spouse name: N/A  . Number of children: N/A  . Years of education: N/A   Occupational History  . 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 Smoking status: Former Smoker    Packs/day: 3.00    Years: 25.00    Types: Cigarettes    Quit date: 07/10/1994  . Smokeless tobacco: Never Used  . Alcohol use No  . Drug use: Unknown  . Sexual activity: Not on file   Other Topics Concern  . Not on file   Social History Narrative  . No narrative on file   Born in [place], [place]. Widowed; had been married 24 years.  Sister in law Murrell Redden; their right hand to be with her when they can't be here. Son Carol Bradley and his wife Eustaquio Maize live in [place]; always here for the major stuff Only the one son, no grandchildren.  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 Hobbies include gardening in the flowers, church work, lots of volunteer work in the community Volunteered at [employer], [employer] So her diagnosis has been a huge life change, since she was so active in the community. Loves her flower garden; has a pond with a flower in it, wind chimes, flowers.  Smoked, quit in '96. No problems with alcohol.  FAMILY HISTORY: Family History  Problem Relation Age of Onset  . Heart failure Mother   . Cancer Father    Mother would have been 3 in Sep 05, 2022; died in 2023/06/06 She was very healthy; mind and memory really good; active until the last 4-5 months of her life. Died of congestive heart failure; had lung and breathing problems; her age was the main  issue, "she just wore out." Father died at age 52. He had appendicitis. Had half of his stomach removed prior to the appendicitis; that's what started it.  5 brothers and 1 sister. 1 brother deceased with lung  cancer, 2 or 3 years ago. He was a smoker. Everyone else is still living, in as good of health "as their age will allow them to be." No one else with myeloma.   ALLERGIES:  is allergic to bortezomib; lisinopril; and plavix [clopidogrel bisulfate].  MEDICATIONS:  Current Outpatient Prescriptions  Medication Sig Dispense Refill  . acyclovir (ZOVIRAX) 200 MG capsule Take 1 capsule (200 mg total) by mouth 2 (two) times daily. 180 capsule 1  . albuterol (PROVENTIL HFA;VENTOLIN HFA) 108 (90 Base) MCG/ACT inhaler Inhale 1 puff into the lungs every 6 (six) hours as needed for wheezing or shortness of breath.    Marland Kitchen aspirin EC 81 MG tablet Take 81 mg by mouth daily.      Marland Kitchen atorvastatin (LIPITOR) 10 MG tablet TAKE ONE TABLET BY MOUTH DAILY. 90 tablet 2  . Calcium Carb-Cholecalciferol (CALCIUM 600+D3 PO) Take 1 tablet by mouth 3 (three) times daily.    . cyanocobalamin 1000 MCG tablet Inject 100 mcg as directed once a week.    Marland Kitchen dexamethasone (DECADRON) 4 MG tablet Take 10 tablets (40 mg total) by mouth every 7 (seven) days. 40 tablet 3  . diazepam (VALIUM) 5 MG tablet Take 5 mg by mouth every 6 (six) hours as needed for anxiety.    Marland Kitchen diltiazem (CARDIZEM CD) 240 MG 24 hr capsule TAKE ONE CAPSULE BY MOUTH DAILY 30 capsule 11  . fluticasone (FLONASE) 50 MCG/ACT nasal spray Place 1 spray into the nose as needed.    . furosemide (LASIX) 20 MG tablet Take 20 mg by mouth daily as needed. For 7 days PRN swelling    . lenalidomide (REVLIMID) 15 MG capsule Take 1 capsule (15 mg total) by mouth daily. Take 15 mg PO days 1-21 every 28 days 21 capsule 0  . losartan (COZAAR) 100 MG tablet TAKE ONE TABLET BY MOUTH DAILY. 90 tablet 2  . magnesium oxide (MAG-OX) 400 MG tablet Take 400 mg by mouth daily. When  taking lasix    . metoprolol tartrate (LOPRESSOR) 25 MG tablet Take 1 tablet (25 mg total) by mouth 2 (two) times daily. 180 tablet 3  . Multiple Vitamins-Minerals (CENTRUM SILVER PO) Take by mouth.    . phenylephrine (SUDAFED PE) 10 MG TABS tablet Take 10 mg by mouth as needed.    . potassium chloride (K-DUR,KLOR-CON) 10 MEQ tablet Take 10 mEq by mouth 3 (three) times daily. When taking lasix    . predniSONE (DELTASONE) 5 MG tablet Take 5 mg by mouth 2 (two) times daily with a meal. And once daily following dexamethasone    . prochlorperazine (COMPAZINE) 10 MG tablet Take 10 mg by mouth every 6 (six) hours as needed for nausea or vomiting.    Marland Kitchen Umeclidinium-Vilanterol (ANORO ELLIPTA) 62.5-25 MCG/INH AEPB Inhale 1 puff into the lungs daily.     No current facility-administered medications for this visit.    Facility-Administered Medications Ordered in Other Visits  Medication Dose Route Frequency Provider Last Rate Last Dose  . acetaminophen (TYLENOL) tablet 650 mg  650 mg Oral Q6H PRN Patrici Ranks, MD      . cyanocobalamin ((VITAMIN B-12)) injection 1,000 mcg  1,000 mcg Intramuscular Once Patrici Ranks, MD      . diphenhydrAMINE (BENADRYL) capsule 25 mg  25 mg Oral Q4H PRN Patrici Ranks, MD      . Immune Globulin 10% (PRIVIGEN) IV infusion 40 g  600 mg/kg Intravenous Once Patrici Ranks, MD  Review of Systems  Constitutional: Positive for malaise/fatigue at times, "but it's really only bad when my hemoglobin is low."   HENT: Negative for congestion, ear discharge, ear pain, hearing loss, nosebleeds, sore throat and tinnitus.   Eyes: Negative.  Negative for blurred vision, double vision, photophobia, pain, discharge and redness.  Respiratory: Positive for shortness of breath, but "only when my counts are low." Negative for stridor.   Cardiovascular: Negative for chest pain and palpitations.  Gastrointestinal: Negative.  Negative for abdominal pain, blood in stool,  constipation, diarrhea, heartburn, melena, nausea and vomiting.  Genitourinary: Negative.  Negative for dysuria, flank pain, frequency, hematuria and urgency.  Musculoskeletal: Negative.  Negative for back pain, falls, joint pain, myalgias and neck pain.  Skin: Negative.  Negative for itching and rash.  Neurological: Positive for weakness. Negative for dizziness, tingling, tremors, sensory change, speech change, focal weakness, seizures, loss of consciousness and headaches.  Endo/Heme/Allergies: Negative.   Psychiatric/Behavioral: Negative.   All other systems reviewed and are negative.  14 point ROS was done and is otherwise as detailed above or in HPI   PHYSICAL EXAMINATION: ECOG PERFORMANCE STATUS: 2 - Symptomatic, <50% confined to bed  Vitals:   02/17/16 0800  BP: (!) 135/48  Pulse: 86  Resp: 16  Temp: 98 F (36.7 C)   Filed Weights   02/17/16 0800  Weight: 149 lb 8 oz (67.8 kg)     Physical Exam  Constitutional: She is oriented to person, place, and time and well-developed, well-nourished, and in no distress. Accompanied by her son.  Physical exam was performed in a chair.  HENT:  Head: Normocephalic. Mouth/Throat: Oropharynx is clear and moist. No oropharyngeal exudate.  Eyes: Conjunctivae and EOM are normal. Pupils are equal, round, and reactive to light. Right eye exhibits no discharge. Left eye exhibits no discharge. No scleral icterus.  Neck: Normal range of motion. Neck supple. No thyromegaly present.  Cardiovascular: Normal rate, regular rhythm, normal heart sounds.  Pulmonary/Chest: Effort normal and breath sounds normal. No respiratory distress. She has no wheezes.  Abdominal: Soft, non-tender. Bowel sounds are normal.  Musculoskeletal: Normal range of motion. She exhibits no edema.  Lymphadenopathy: She has no cervical adenopathy.  Neurological: She is alert and oriented to person, place, and time. No focal deficits. Skin: Skin is warm and dry. No rash noted.  She is not diaphoretic. No erythema.  Psychiatric: Mood, memory, affect and judgment normal.  Nursing note and vitals reviewed.   LABORATORY DATA:  I have reviewed the data as listed Lab Results  Component Value Date   WBC 11.0 (H) 02/17/2016   HGB 11.1 (L) 02/17/2016   HCT 34.0 (L) 02/17/2016   MCV 93.7 02/17/2016   PLT 228 02/17/2016   CMP     Component Value Date/Time   NA 140 02/17/2016 0804   K 4.3 02/17/2016 0804   CL 108 02/17/2016 0804   CO2 24 02/17/2016 0804   GLUCOSE 154 (H) 02/17/2016 0804   BUN 26 (H) 02/17/2016 0804   CREATININE 0.99 02/17/2016 0804   CALCIUM 7.4 (L) 02/17/2016 0804   PROT 6.0 (L) 02/17/2016 0804   ALBUMIN 3.0 (L) 02/17/2016 0804   AST 72 (H) 02/17/2016 0804   ALT 98 (H) 02/17/2016 0804   ALKPHOS 81 02/17/2016 0804   BILITOT 0.5 02/17/2016 0804   GFRNONAA 54 (L) 02/17/2016 0804   GFRAA >60 02/17/2016 0804   *Labs reviewed and are largely stable.  RADIOGRAPHIC STUDIES: I have personally reviewed the radiological images as listed and agreed with the findings in the report. CLINICAL DATA:  Initial treatment strategy for multiple myeloma.  EXAM: NUCLEAR MEDICINE PET WHOLE BODY  TECHNIQUE: 7.54 mCi F-18 FDG was injected intravenously. Full-ring PET imaging was performed from the vertex to the feet after the radiotracer. CT data was obtained and used for attenuation correction and anatomic localization.  FASTING BLOOD GLUCOSE:  Value:  93 mg/dl  COMPARISON:  Abdominal pelvic CT 10/07/2014.  FINDINGS: NECK  No hypermetabolic cervical lymph nodes are identified.There are no lesions of the pharyngeal mucosal space. Carotid atherosclerosis noted bilaterally.  CHEST  There are no hypermetabolic mediastinal, hilar or axillary lymph nodes. There is no suspicious pulmonary activity. Moderate emphysematous changes are present throughout the lungs. There is atherosclerosis of the aorta, great vessels and  coronary arteries.  ABDOMEN/PELVIS  There is no hypermetabolic activity within the liver, adrenal glands, spleen or pancreas. There is no hypermetabolic nodal activity. There is a left renal cysts, aortoiliac atherosclerosis and sigmoid diverticulosis.  SKELETON  There is no hypermetabolic activity to suggest osseous metastatic disease. There are mottled lucencies throughout the axial skeleton without focal lytic lesion or pathologic fracture. Lower cervical fusion noted.  Extremities: No hypermetabolic activity to suggest metastasis. There is fatty atrophy within left lower leg gastrocnemius and soleus musculature.  IMPRESSION: 1. No evidence of metabolically active multiple myeloma in the neck, chest, abdomen, pelvis or extremities. 2. No other acute or significant findings. Chronic lung disease and atherosclerosis as described.   Electronically Signed   By: Richardean Sale M.D.   On: 03/05/2015 14:59   ASSESSMENT & PLAN:  IgG kappa Myeloma Anemia Fatigue Hypogammaglobulenemia Stage 3 CKD B12 deficiency Marked elevation of ESR Stool incontinence on Velcade Severe COPD from prior tobacco abuse Diastolic CHF  Current plan per Dr. Whitney Muse (01/15/16):  I reviewed her last myeloma labs with the patient and her family. There was no evidence of an M-spike. She has had problems with recurrent infections. We discussed several issues. 1. Revlimid/dex is certainly a reasonable regimen for her given her comorbidities and difficulties with prior regimens. Doublet regimens are appropriate for frail or elderly patients. We will follow her myeloma studies closely. 2. If they desire we can refer her to Dr. Tiana Loft at [facility] in the future 3. Given problems with pneumonia and her low IgG I would institute IVIG once monthly 4. Continue B12 5. Reinstitute zometa at some point in the future. We will obtain her MAR from [facility] to see when her last dose was given.  6. Continue with  current dose revlimid at 15 mg given renal dysfunction. Would increase aspirin to 325 mg from 81 mg  7. Question institution on renal dosing of aranesp if anemia continues to be problematic moving forward  I discussed with her son that a listed side effect of revlimid is pneumonia however she had pneumonia prior to ever starting the drug which I feel is related more to her immunocompromised state.  Once she's recovered a little more, we will work her in for a mammogram as well.  I will send her home today with some books and other reading [place] on myeloma. When she returns, we will need to discuss obtaining ongoing Revlimid refills.  ASSESSMENT & PLAN (02/17/16):  -She will get her monthly IVIG & B12 today.   -Her corrected calcium is 8.2 (serum calcium 7.4, albumin 3.0).  She will received scheduled Zometa today.  -Continue current  dose of Revlimid 15 mg (dose-reduced for renal dysfunction).  -She will return to clinic in 2 weeks for lab check only (given son's concerns to decreased hemoglobin and his mother feeling bad between monthly treatments; I encouraged the patient to call the cancer center if she is feeling bad at the time her labs are drawn, and we can try to fit in an appointment for her to see Dr. Whitney Muse at that time, if needed).  This solution was acceptable to the patient's son and the patient.  I will have them collect an extra tube of blood in 2 weeks, just in case the patient needs to be transfused at that time).  -Will arrange for her to get a mammogram to be scheduled in 2 weeks to try to coordinate with her upcoming lab/office visit appointments. Patient/son agreed with this plan.     Mrs. Palecek and her son were advised that, for her UTI and other infections that she's been experiencing recently, she will need to continue following up with her PCP, Dr. Woody Seller. They were also advised that we will continue managing things as they relate to her myeloma, but having a PCP will be  integral in her care as well.  They voiced understanding and agreed with this plan.   Mrs. Burges son is concerned that Mrs. Mcqueen's records have been successfully transferred.  We will check our medical records to ensure we have received the appropriate records.   We will follow up on Mrs. Stagliano's anemia and continue to investigate the cause; they remain interested in seeing Dr. Norma Fredrickson at [facility]. I will defer to Dr. Donald Pore judgment on when the most appropriate timing for that referral should take place.      ---  All questions were answered. The patient knows to call the clinic with any problems, questions or concerns.  This document serves as a record of services personally performed by Mike Craze, NP. It was created on her behalf by Toni Amend, a trained medical scribe. The creation of this record is based on the scribe's personal observations and the provider's statements to them. This document has been checked and approved by the attending provider.  I have reviewed the above documentation for accuracy and completeness and I agree with the above.  This note was electronically signed.   Mike Craze, NP 02/17/2016 9:17 AM

## 2016-02-17 NOTE — Patient Instructions (Signed)
[place] at [facility] Discharge Instructions  RECOMMENDATIONS MADE BY THE CONSULTANT AND ANY TEST RESULTS WILL BE SENT TO YOUR REFERRING PHYSICIAN.  You saw Mike Craze, NP, today. Dr. Whitney Muse is out due to medical reasons. Return in 2 weeks for labs. Mammogram will also be scheduled soon, hopefully same day as labs. Return in 4 weeks for follow up with Dr. Whitney Muse.  Thank you for choosing [place] at [facility] to provide your oncology and hematology care.  To afford each patient quality time with our provider, please arrive at least 15 minutes before your scheduled appointment time.   Beginning January 23rd 2017 lab work for the [name] will be done in the  Main lab at [facility] on 1st floor. If you have a lab appointment with the [facility] please come in thru the  Main Entrance and check in at the main information desk  You need to re-schedule your appointment should you arrive 10 or more minutes late.  We strive to give you quality time with our providers, and arriving late affects you and other patients whose appointments are after yours.  Also, if you no show three or more times for appointments you may be dismissed from the clinic at the providers discretion.     Again, thank you for choosing [facility].  Our hope is that these requests will decrease the amount of time that you wait before being seen by our physicians.       _____________________________________________________________  Should you have questions after your visit to [facility], please contact our office at [phone] between the hours of 8:30 a.m. and 4:30 p.m.  Voicemails left after 4:30 p.m. will not be returned until the following business day.  For prescription refill requests, have your pharmacy contact our office.         Resources For Cancer Patients and their Caregivers ? American Cancer Society: Can assist  with transportation, wigs, general needs, runs Look Good Feel Better.        419-632-6098 ? Cancer Care: Provides financial assistance, online support groups, medication/co-pay assistance.  1-800-813-HOPE 224-044-2387) ? [facility] Assists [place] cancer patients and their families through emotional , educational and financial support.  (641)563-8232 ? [place] DSS Where to apply for food stamps, Medicaid and utility assistance. 573-748-9533 ? RCATS: Transportation to medical appointments. 581 616 4386 ? Social Security Administration: May apply for disability if have a Stage IV cancer. 647-321-1996 404-214-2819 ? [employer], Disability and Transit Services: Assists with nutrition, care and transit needs. [place] Support Programs: @10RELATIVEDAYS @ > Cancer Support Group  2nd Tuesday of the month 1pm-2pm, Journey Room  > Creative Journey  3rd Tuesday of the month 1130am-1pm, Journey Room  > Look Good Feel Better  1st Wednesday of the month 10am-12 noon, Journey Room (Call [place] to register [phone])

## 2016-02-17 NOTE — Progress Notes (Signed)
Zometa held today due to corrected calcium not within protocol to be given today. Corrected Calcium not in normal range per Kirby Crigler PA-C

## 2016-02-17 NOTE — Progress Notes (Signed)
See other infusion encounter.

## 2016-02-17 NOTE — Patient Instructions (Signed)
[place] at [facility] Discharge Instructions  RECOMMENDATIONS MADE BY THE CONSULTANT AND ANY TEST RESULTS WILL BE SENT TO YOUR REFERRING PHYSICIAN.  You received your B12 injection today, your IVIG. Held your Zometa, please increase Calcium to 4 x day, Take 4 tums a day. Follow up as scheduled. Call for any concerns or questions.  Thank you for choosing [place] at [facility] to provide your oncology and hematology care.  To afford each patient quality time with our provider, please arrive at least 15 minutes before your scheduled appointment time.   Beginning January 23rd 2017 lab work for the [name] will be done in the  Main lab at [facility] on 1st floor. If you have a lab appointment with the [facility] please come in thru the  Main Entrance and check in at the main information desk  You need to re-schedule your appointment should you arrive 10 or more minutes late.  We strive to give you quality time with our providers, and arriving late affects you and other patients whose appointments are after yours.  Also, if you no show three or more times for appointments you may be dismissed from the clinic at the providers discretion.     Again, thank you for choosing [facility].  Our hope is that these requests will decrease the amount of time that you wait before being seen by our physicians.       _____________________________________________________________  Should you have questions after your visit to [facility], please contact our office at [phone] between the hours of 8:30 a.m. and 4:30 p.m.  Voicemails left after 4:30 p.m. will not be returned until the following business day.  For prescription refill requests, have your pharmacy contact our office.         Resources For Cancer Patients and their Caregivers ? American Cancer Society: Can assist with transportation, wigs, general needs,  runs Look Good Feel Better.        403-866-8345 ? Cancer Care: Provides financial assistance, online support groups, medication/co-pay assistance.  1-800-813-HOPE 707-640-6708) ? [facility] Assists [place] cancer patients and their families through emotional , educational and financial support.  445-608-4660 ? [place] DSS Where to apply for food stamps, Medicaid and utility assistance. (870)793-8442 ? RCATS: Transportation to medical appointments. 608-418-2560 ? Social Security Administration: May apply for disability if have a Stage IV cancer. 570-780-1233 778-573-2631 ? [employer], Disability and Transit Services: Assists with nutrition, care and transit needs. [place] Support Programs: @10RELATIVEDAYS @ > Cancer Support Group  2nd Tuesday of the month 1pm-2pm, Journey Room  > Creative Journey  3rd Tuesday of the month 1130am-1pm, Journey Room  > Look Good Feel Better  1st Wednesday of the month 10am-12 noon, Journey Room (Call [place] to register [phone])

## 2016-02-18 LAB — PROTEIN ELECTROPHORESIS, SERUM
A/G Ratio: 1.1 (ref 0.7–1.7)
ALPHA-1-GLOBULIN: 0.3 g/dL (ref 0.0–0.4)
ALPHA-2-GLOBULIN: 0.9 g/dL (ref 0.4–1.0)
Albumin ELP: 2.9 g/dL (ref 2.9–4.4)
Beta Globulin: 0.8 g/dL (ref 0.7–1.3)
GAMMA GLOBULIN: 0.7 g/dL (ref 0.4–1.8)
Globulin, Total: 2.7 g/dL (ref 2.2–3.9)
Total Protein ELP: 5.6 g/dL — ABNORMAL LOW (ref 6.0–8.5)

## 2016-02-18 LAB — KAPPA/LAMBDA LIGHT CHAINS
KAPPA FREE LGHT CHN: 11 mg/L (ref 3.3–19.4)
Kappa, lambda light chain ratio: 0.82 (ref 0.26–1.65)
Lambda free light chains: 13.4 mg/L (ref 5.7–26.3)

## 2016-02-18 LAB — BETA 2 MICROGLOBULIN, SERUM: BETA 2 MICROGLOBULIN: 2.2 mg/L (ref 0.6–2.4)

## 2016-02-18 LAB — IGG, IGA, IGM
IGA: 98 mg/dL (ref 64–422)
IGG (IMMUNOGLOBIN G), SERUM: 632 mg/dL — AB (ref 700–1600)
IGM, SERUM: 101 mg/dL (ref 26–217)

## 2016-02-21 LAB — IMMUNOFIXATION ELECTROPHORESIS
IGA: 102 mg/dL (ref 64–422)
IGG (IMMUNOGLOBIN G), SERUM: 623 mg/dL — AB (ref 700–1600)
IgM, Serum: 102 mg/dL (ref 26–217)
Total Protein ELP: 5.6 g/dL — ABNORMAL LOW (ref 6.0–8.5)

## 2016-02-29 ENCOUNTER — Other Ambulatory Visit (HOSPITAL_COMMUNITY): Payer: Self-pay | Admitting: Oncology

## 2016-02-29 DIAGNOSIS — C9000 Multiple myeloma not having achieved remission: Secondary | ICD-10-CM

## 2016-02-29 MED ORDER — LENALIDOMIDE 15 MG PO CAPS: 15.0000 mg | ORAL_CAPSULE | Freq: Every day | ORAL | 0 refills | Status: DC

## 2016-03-02 ENCOUNTER — Encounter (HOSPITAL_COMMUNITY): Payer: Medicare Other

## 2016-03-02 ENCOUNTER — Telehealth (HOSPITAL_COMMUNITY): Payer: Self-pay | Admitting: *Deleted

## 2016-03-02 DIAGNOSIS — C9000 Multiple myeloma not having achieved remission: Secondary | ICD-10-CM

## 2016-03-02 DIAGNOSIS — D801 Nonfamilial hypogammaglobulinemia: Secondary | ICD-10-CM

## 2016-03-02 DIAGNOSIS — D649 Anemia, unspecified: Secondary | ICD-10-CM

## 2016-03-02 LAB — BASIC METABOLIC PANEL
Anion gap: 5 (ref 5–15)
BUN: 25 mg/dL — AB (ref 6–20)
CHLORIDE: 112 mmol/L — AB (ref 101–111)
CO2: 22 mmol/L (ref 22–32)
CREATININE: 0.97 mg/dL (ref 0.44–1.00)
Calcium: 6.9 mg/dL — ABNORMAL LOW (ref 8.9–10.3)
GFR calc Af Amer: >60 mL/min (ref >60)
GFR calc non Af Amer: 56 mL/min — ABNORMAL LOW (ref >60)
Glucose, Bld: 119 mg/dL — ABNORMAL HIGH (ref 65–99)
Potassium: 3.5 mmol/L (ref 3.5–5.1)
Sodium: 139 mmol/L (ref 135–145)

## 2016-03-02 LAB — CBC WITH DIFFERENTIAL/PLATELET
BASOS PCT: 0 %
Basophils Absolute: 0 10*3/uL (ref 0.0–0.1)
EOS PCT: 8 %
Eosinophils Absolute: 0.9 10*3/uL — ABNORMAL HIGH (ref 0.0–0.7)
HEMATOCRIT: 31.2 % — AB (ref 36.0–46.0)
Hemoglobin: 10.2 g/dL — ABNORMAL LOW (ref 12.0–15.0)
LYMPHS ABS: 1.9 10*3/uL (ref 0.7–4.0)
Lymphocytes Relative: 17 %
MCH: 31.7 pg (ref 26.0–34.0)
MCHC: 32.7 g/dL (ref 30.0–36.0)
MCV: 96.9 fL (ref 78.0–100.0)
MONO ABS: 1.7 10*3/uL — AB (ref 0.1–1.0)
Monocytes Relative: 15 %
NEUTROS ABS: 6.8 10*3/uL (ref 1.7–7.7)
Neutrophils Relative %: 60 %
Platelets: 195 10*3/uL (ref 150–400)
RBC: 3.22 MIL/uL — ABNORMAL LOW (ref 3.87–5.11)
RDW: 19.5 % — AB (ref 11.5–15.5)
WBC: 11.3 10*3/uL — ABNORMAL HIGH (ref 4.0–10.5)

## 2016-03-02 NOTE — Telephone Encounter (Signed)
LMOM for pt, labs are normal. Call back if any questions.

## 2016-03-02 NOTE — Telephone Encounter (Signed)
-----   Message from Baird [facility], PA-C sent at 03/02/2016  2:48 PM EDT ----- Nursing, please let patient know labs are stable.

## 2016-03-16 ENCOUNTER — Encounter (HOSPITAL_BASED_OUTPATIENT_CLINIC_OR_DEPARTMENT_OTHER): Payer: Medicare Other | Admitting: Oncology

## 2016-03-16 ENCOUNTER — Encounter (HOSPITAL_COMMUNITY): Payer: Medicare Other

## 2016-03-16 ENCOUNTER — Encounter (HOSPITAL_COMMUNITY): Payer: Medicare Other | Attending: Oncology

## 2016-03-16 ENCOUNTER — Ambulatory Visit (HOSPITAL_COMMUNITY): Payer: Medicare Other

## 2016-03-16 ENCOUNTER — Encounter (HOSPITAL_COMMUNITY): Payer: Self-pay | Admitting: Oncology

## 2016-03-16 ENCOUNTER — Ambulatory Visit (HOSPITAL_COMMUNITY): Payer: Medicare Other | Admitting: Hematology & Oncology

## 2016-03-16 VITALS — BP 130/65 | HR 70 | Temp 97.8°F | Resp 18 | Wt 154.0 lb

## 2016-03-16 DIAGNOSIS — C9001 Multiple myeloma in remission: Secondary | ICD-10-CM

## 2016-03-16 DIAGNOSIS — E538 Deficiency of other specified B group vitamins: Secondary | ICD-10-CM

## 2016-03-16 DIAGNOSIS — D801 Nonfamilial hypogammaglobulinemia: Secondary | ICD-10-CM

## 2016-03-16 LAB — BASIC METABOLIC PANEL
ANION GAP: 9 (ref 5–15)
BUN: 31 mg/dL — AB (ref 6–20)
CO2: 24 mmol/L (ref 22–32)
Calcium: 8.2 mg/dL — ABNORMAL LOW (ref 8.9–10.3)
Chloride: 103 mmol/L (ref 101–111)
Creatinine, Ser: 0.96 mg/dL (ref 0.44–1.00)
GFR calc Af Amer: >60 mL/min (ref >60)
GFR, EST NON AFRICAN AMERICAN: 56 mL/min — AB (ref >60)
GLUCOSE: 111 mg/dL — AB (ref 65–99)
POTASSIUM: 4.1 mmol/L (ref 3.5–5.1)
Sodium: 136 mmol/L (ref 135–145)

## 2016-03-16 LAB — CBC WITH DIFFERENTIAL/PLATELET
BASOS PCT: 0 %
Basophils Absolute: 0 10*3/uL (ref 0.0–0.1)
EOS PCT: 0 %
Eosinophils Absolute: 0 10*3/uL (ref 0.0–0.7)
HEMATOCRIT: 30.9 % — AB (ref 36.0–46.0)
HEMOGLOBIN: 10.4 g/dL — AB (ref 12.0–15.0)
LYMPHS ABS: 1.2 10*3/uL (ref 0.7–4.0)
LYMPHS PCT: 6 %
MCH: 32.7 pg (ref 26.0–34.0)
MCHC: 33.7 g/dL (ref 30.0–36.0)
MCV: 97.2 fL (ref 78.0–100.0)
MONOS PCT: 6 %
Monocytes Absolute: 1.2 10*3/uL — ABNORMAL HIGH (ref 0.1–1.0)
NEUTROS ABS: 18.4 10*3/uL — AB (ref 1.7–7.7)
Neutrophils Relative %: 88 %
Platelets: 183 10*3/uL (ref 150–400)
RBC: 3.18 MIL/uL — ABNORMAL LOW (ref 3.87–5.11)
RDW: 19.1 % — AB (ref 11.5–15.5)
WBC: 20.8 10*3/uL — ABNORMAL HIGH (ref 4.0–10.5)

## 2016-03-16 MED ORDER — DIPHENHYDRAMINE HCL 25 MG PO CAPS
25.0000 mg | ORAL_CAPSULE | ORAL | Status: AC | PRN
  Administered 2016-03-16: 25 mg via ORAL
  Filled 2016-03-16: qty 1

## 2016-03-16 MED ORDER — IMMUNE GLOBULIN (HUMAN) 10 GM/100ML IV SOLN
600.0000 mg/kg | Freq: Once | INTRAVENOUS | Status: AC
Start: 2016-03-16 — End: 2016-03-16
  Administered 2016-03-16: 40 g via INTRAVENOUS
  Filled 2016-03-16: qty 200

## 2016-03-16 MED ORDER — DEXTROSE 5 % IV SOLN
INTRAVENOUS | Status: DC
  Administered 2016-03-16: 10:00:00 via INTRAVENOUS

## 2016-03-16 MED ORDER — CYANOCOBALAMIN 1000 MCG/ML IJ SOLN
1000.0000 ug | Freq: Once | INTRAMUSCULAR | Status: AC
  Administered 2016-03-16: 1000 ug via INTRAMUSCULAR
  Filled 2016-03-16: qty 1

## 2016-03-16 MED ORDER — ACETAMINOPHEN 325 MG PO TABS
650.0000 mg | ORAL_TABLET | Freq: Four times a day (QID) | ORAL | Status: DC | PRN
  Administered 2016-03-16: 650 mg via ORAL
  Filled 2016-03-16 (×2): qty 2

## 2016-03-16 NOTE — Progress Notes (Signed)
Carol Bradley presents today for injection per MD orders. B12 1,025mcg administered IM in right Upper Arm. Administration without incident. Patient tolerated well.    Zometa held today per Kirby Crigler PA-C   IVIG given today per orders. Patient tolerated it well. Vitals are stable, discharged ambulatory with friend today.

## 2016-03-16 NOTE — Patient Instructions (Signed)
[facility] Discharge Instructions for Patients Receiving Chemotherapy   Beginning January 23rd 2017 lab work for the Cancer Center will be done in the  Main lab at [facility] on 1st floor. If you have a lab appointment with the Cancer Center please come in thru the  Main Entrance and check in at the main information desk   Today you received the following chemotherapy agents IVIG  To help prevent nausea and vomiting after your treatment, we encourage you to take your nausea medication   If you develop nausea and vomiting, or diarrhea that is not controlled by your medication, call the clinic.  The clinic phone number is [phone]. Office hours are Monday-Friday 8:30am-5:00pm.  BELOW ARE SYMPTOMS THAT SHOULD BE REPORTED IMMEDIATELY:  *FEVER GREATER THAN 101.0 F  *CHILLS WITH OR WITHOUT FEVER  NAUSEA AND VOMITING THAT IS NOT CONTROLLED WITH YOUR NAUSEA MEDICATION  *UNUSUAL SHORTNESS OF BREATH  *UNUSUAL BRUISING OR BLEEDING  TENDERNESS IN MOUTH AND THROAT WITH OR WITHOUT PRESENCE OF ULCERS  *URINARY PROBLEMS  *BOWEL PROBLEMS  UNUSUAL RASH Items with * indicate a potential emergency and should be followed up as soon as possible. If you have an emergency after office hours please contact your primary care physician or go to the nearest emergency department.  Please call the clinic during office hours if you have any questions or concerns.   You may also contact the Patient Navigator at [phone] should you have any questions or need assistance in obtaining follow up care.      Resources For Cancer Patients and their Caregivers ? American Cancer Society: Can assist with transportation, wigs, general needs, runs Look Good Feel Better.        1-888-227-6333 ? Cancer Care: Provides financial assistance, online support groups, medication/co-pay assistance.  1-800-813-HOPE (4673) ? [facility] Assists [place] cancer  patients and their families through emotional , educational and financial support.  336-427-4357 ? [place] DSS Where to apply for food stamps, Medicaid and utility assistance. 336-342-1394 ? RCATS: Transportation to medical appointments. 336-347-2287 ? Social Security Administration: May apply for disability if have a Stage IV cancer. 336-342-7796 1-800-772-1213 ? [place] Aging, Disability and Transit Services: Assists with nutrition, care and transit needs. [phone]          

## 2016-03-16 NOTE — Progress Notes (Signed)
See other encounter.

## 2016-03-16 NOTE — Patient Instructions (Signed)
[name] at [facility] Discharge Instructions  RECOMMENDATIONS MADE BY THE CONSULTANT AND ANY TEST RESULTS WILL BE SENT TO YOUR REFERRING PHYSICIAN.  You were seen by Carol Bradley today You need to take: Calcium 1200 mg daily                              Vitamin D 1000 units daily                              Aspirin 325 mg daily IVIG monthly Labs in 2 weeks and 4 weeks Return for follow up and IVIG in 4 weeks  Thank you for choosing [place] at [facility] to provide your oncology and hematology care.  To afford each patient quality time with our provider, please arrive at least 15 minutes before your scheduled appointment time.   Beginning January 23rd 2017 lab work for the [name] will be done in the  Main lab at [facility] on 1st floor. If you have a lab appointment with the [facility] please come in thru the  Main Entrance and check in at the main information desk  You need to re-schedule your appointment should you arrive 10 or more minutes late.  We strive to give you quality time with our providers, and arriving late affects you and other patients whose appointments are after yours.  Also, if you no show three or more times for appointments you may be dismissed from the clinic at the providers discretion.     Again, thank you for choosing [facility].  Our hope is that these requests will decrease the amount of time that you wait before being seen by our physicians.       _____________________________________________________________  Should you have questions after your visit to [facility], please contact our office at [phone] between the hours of 8:30 a.m. and 4:30 p.m.  Voicemails left after 4:30 p.m. will not be returned until the following business day.  For prescription refill requests, have your pharmacy contact our office.         Resources For Cancer Patients and their  Caregivers ? American Cancer Society: Can assist with transportation, wigs, general needs, runs Look Good Feel Better.        201-169-5528 ? Cancer Care: Provides financial assistance, online support groups, medication/co-pay assistance.  1-800-813-HOPE 249-449-1551) ? [facility] Assists [name] cancer patients and their families through emotional , educational and financial support.  (414) [phone] ? [place] DSS Where to apply for food stamps, Medicaid and utility assistance. 209-078-2339 ? RCATS: Transportation to medical appointments. 435-487-6711 ? Social Security Administration: May apply for disability if have a Stage IV cancer. 289-731-2599 7376787727 ? [employer], Disability and Transit Services: Assists with nutrition, care and transit needs. [place] Support Programs: @10RELATIVEDAYS @ > Cancer Support Group  2nd Tuesday of the month 1pm-2pm, Journey Room  > Creative Journey  3rd Tuesday of the month 1130am-1pm, Journey Room  > Look Good Feel Better  1st Wednesday of the month 10am-12 noon, Journey Room (Call [place] to register [phone])

## 2016-03-17 ENCOUNTER — Encounter (HOSPITAL_COMMUNITY): Payer: Self-pay | Admitting: Oncology

## 2016-03-17 NOTE — Assessment & Plan Note (Signed)
Hypogammamglobulinemia, on IVIG monthly.  Labs reviewed: IgG, IgA, IgM.  I personally reviewed and went over laboratory results with the patient.  The results are noted within this dictation.  Based upon her IgG level, will increase IVIG dose.  Supportive therapy plan is reviewed and updated accordingly.  Labs in 4 weeks: IgG, IgA, IgM.

## 2016-03-17 NOTE — Assessment & Plan Note (Signed)
IgG kappa Myeloma, on Revlimid/Dexamethasone and taking ASA 325 mg daily.  Labs today: CBC diff, CMET.  I personally reviewed and went over laboratory results with the patient.  The results are noted within this dictation.  Hypocalcemia noted.  Labs in 2 weeks: CBC diff.  Labs in 4 weeks: CBC diff, CMET, ESR, CRP, SPEP+IFE, IgG, IgA, IgM, light chain assay, B2M  Zometa held today due to hypocalcemia.  She is taking Ca++, but no Vit D.  She is provided education on appropriate dosing for Ca++ and Vit D (1200 mg and 1000 units respectively).  Return in 4 weeks for follow-up.

## 2016-03-17 NOTE — Progress Notes (Signed)
Carol Chroman, MD [place] [address]  Multiple myeloma in remission [facility]) - Plan: CBC with Differential, Comprehensive metabolic panel, Sample to Blood Bank, CBC with Differential, Comprehensive metabolic panel, Lactate dehydrogenase, Sedimentation rate, Kappa/lambda light chains, Beta 2 microglobuline, serum, IgG, IgA, IgM, Immunofixation electrophoresis, Protein electrophoresis, serum, C-reactive protein  Hypogammaglobulinemia (HCC)  CURRENT THERAPY: Rev/Dex and monthly IVIG  INTERVAL HISTORY: Carol Bradley 75 y.o. female returns for followup of IgG kappa Myeloma, on Revlimid/Dexamethasone and taking ASA 325 mg daily. AND Hypogammaglobulinemia, on IVIG monthly.    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       03/18/2015 - 07/28/2015 Chemotherapy    Velcade 1.6 mg/m2 discontinued secondary to intolerance      07/28/2015 Adverse Reaction    stool incontinence, weakness, collapse upon standing, felt to be secondary to velcade      11/09/2015 - 12/13/2015 Chemotherapy    cytoxan IV 300 mg/m2 administered X 2 doses only in addition to rev/dex      11/09/2015 -  Chemotherapy    Revlimid/Dexamethasone        She is educated on appropriate Ca++ and VIT D dosing given her hypocalcemia and need for ongoing bisphosphonate therapy.  She denies any complaints including cough, sore throat, sputum production, fevers.  Review of Systems  Constitutional: Negative.  Negative for chills, fever and weight loss.  HENT: Negative.  Negative for congestion, ear pain and sore throat.   Eyes: Negative.   Respiratory: Negative.  Negative for cough and sputum production.   Cardiovascular: Negative.  Negative for chest pain.  Gastrointestinal: Negative.  Negative for constipation, diarrhea, nausea and vomiting.    Genitourinary: Negative.  Negative for dysuria, flank pain and frequency.  Musculoskeletal: Negative.   Skin: Negative.   Neurological: Negative.  Negative for weakness and headaches.  Endo/Heme/Allergies: Negative.   Psychiatric/Behavioral: Negative.     Past Medical History:  Diagnosis Date  . CAD (coronary artery disease)   . COPD (chronic obstructive pulmonary disease) ([place])   . History of tobacco abuse   . Hyperlipidemia   . Hypertension   . Hypogammaglobulinemia ([place]) 01/15/2016  . Multiple myeloma [facility])     Past Surgical History:  Procedure Laterality Date  . CARDIAC CATHETERIZATION  04/2011   right and left cath showing normal right heart pressures,but newly diagnosed coronary artery disease/drug eluting stent placed to RCA with residual disease in the proximal RCA and LAD and ramus, normal LV function and 60-65% EF  . COLONOSCOPY N/A 09/30/2014   Procedure: COLONOSCOPY;  Surgeon: Rogene Houston, MD;  Location: [facility] END[facility] SUITE;  Service: Endoscopy;  Laterality: N/A;  225  . CORONARY ANGIOPLASTY WITH STENT PLACEMENT  04/2011   mid RCA: 3.0 X38 mm Promus DES. Residual 40% disease proximally    Family History  Problem Relation Age of Onset  . Heart failure Mother   . Cancer Father     Social History   Social History  . Marital status: Married    Spouse name: N/A  . Number of children: N/A  . Years of education: N/A   Occupational History  . 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 Smoking status: Former Smoker    Packs/day: 3.00    Years:  25.00    Types: Cigarettes    Quit date: 07/10/1994  . Smokeless tobacco: Never Used  . Alcohol use No  . Drug use: Unknown  . Sexual activity: Not Asked   Other Topics Concern  . None   Social History Narrative  . None     PHYSICAL EXAMINATION  ECOG PERFORMANCE STATUS: 1 - Symptomatic but completely ambulatory  There were no vitals filed for this visit.  BP 135/65 P 79 R 18 T 97.8 O2  Sat 98%  GENERAL:alert, no distress, well nourished, well developed, comfortable, cooperative, smiling and in chemo-recliner, accompanied by friend. SKIN: skin color, texture, turgor are normal, no rashes or significant lesions HEAD: Normocephalic, No masses, lesions, tenderness or abnormalities EYES: normal, EOMI, Conjunctiva are pink and non-injected EARS: External ears normal OROPHARYNX:lips, buccal mucosa, and tongue normal and mucous membranes are moist  NECK: supple, trachea midline LYMPH:  no palpable lymphadenopathy BREAST:not examined LUNGS: clear to auscultation and percussion HEART: regular rate & rhythm ABDOMEN:abdomen soft and normal bowel sounds BACK: Back symmetric, no curvature. EXTREMITIES:less then 2 second capillary refill, no joint deformities, effusion, or inflammation, no skin discoloration, no cyanosis  NEURO: alert & oriented x 3 with fluent speech, no focal motor/sensory deficits  LABORATORY DATA: CBC    Component Value Date/Time   WBC 20.8 (H) 03/16/2016 0944   RBC 3.18 (L) 03/16/2016 0944   HGB 10.4 (L) 03/16/2016 0944   HCT 30.9 (L) 03/16/2016 0944   PLT 183 03/16/2016 0944   MCV 97.2 03/16/2016 0944   MCH 32.7 03/16/2016 0944   MCHC 33.7 03/16/2016 0944   RDW 19.1 (H) 03/16/2016 0944   LYMPHSABS 1.2 03/16/2016 0944   MONOABS 1.2 (H) 03/16/2016 0944   EOSABS 0.0 03/16/2016 0944   BASOSABS 0.0 03/16/2016 0944      Chemistry      Component Value Date/Time   NA 136 03/16/2016 0943   K 4.1 03/16/2016 0943   CL 103 03/16/2016 0943   CO2 24 03/16/2016 0943   BUN 31 (H) 03/16/2016 0943   CREATININE 0.96 03/16/2016 0943      Component Value Date/Time   CALCIUM 8.2 (L) 03/16/2016 0943   ALKPHOS 81 02/17/2016 0804   AST 72 (H) 02/17/2016 0804   ALT 98 (H) 02/17/2016 0804   BILITOT 0.5 02/17/2016 0804        PENDING LABS:   RADIOGRAPHIC STUDIES:  No results found.   PATHOLOGY:    ASSESSMENT AND PLAN:  Multiple myeloma (HCC) IgG  kappa Myeloma, on Revlimid/Dexamethasone and taking ASA 325 mg daily.  Labs today: CBC diff, CMET.  I personally reviewed and went over laboratory results with the patient.  The results are noted within this dictation.  Hypocalcemia noted.  Labs in 2 weeks: CBC diff.  Labs in 4 weeks: CBC diff, CMET, ESR, CRP, SPEP+IFE, IgG, IgA, IgM, light chain assay, B2M  Zometa held today due to hypocalcemia.  She is taking Ca++, but no Vit D.  She is provided education on appropriate dosing for Ca++ and Vit D (1200 mg and 1000 units respectively).  Return in 4 weeks for follow-up.    Hypogammaglobulinemia (HCC) Hypogammamglobulinemia, on IVIG monthly.  Labs reviewed: IgG, IgA, IgM.  I personally reviewed and went over laboratory results with the patient.  The results are noted within this dictation.  Based upon her IgG level, will increase IVIG dose.  Supportive therapy plan is reviewed and updated accordingly.  Labs in 4 weeks: IgG, IgA, IgM.  ORDERS PLACED FOR THIS ENCOUNTER: Orders Placed This Encounter  Procedures  . CBC with Differential  . Comprehensive metabolic panel  . CBC with Differential  . Comprehensive metabolic panel  . Lactate dehydrogenase  . Sedimentation rate  . Kappa/lambda light chains  . Beta 2 microglobuline, serum  . IgG, IgA, IgM  . Immunofixation electrophoresis  . Protein electrophoresis, serum  . C-reactive protein  . Sample to Blood Bank    MEDICATIONS PRESCRIBED THIS ENCOUNTER: No orders of the defined types were placed in this encounter.   THERAPY PLAN:  Rev/Dex and monthly IVIG.  All questions were answered. The patient knows to call the clinic with any problems, questions or concerns. We can certainly see the patient much sooner if necessary.  Patient and plan discussed with Dr. Ancil Linsey and she is in agreement with the aforementioned.   This note is electronically signed by: Doy Mince 03/17/2016 8:45 PM

## 2016-03-25 ENCOUNTER — Other Ambulatory Visit: Payer: Self-pay | Admitting: Cardiology

## 2016-03-27 ENCOUNTER — Emergency Department (HOSPITAL_COMMUNITY)
Admission: EM | Admit: 2016-03-27 | Discharge: 2016-03-27 | Disposition: A | Discharge status: Home / Self Care | Payer: Medicare Other | Attending: Emergency Medicine | Admitting: Emergency Medicine

## 2016-03-27 ENCOUNTER — Other Ambulatory Visit: Payer: Self-pay

## 2016-03-27 ENCOUNTER — Encounter (HOSPITAL_COMMUNITY): Payer: Self-pay | Admitting: Emergency Medicine

## 2016-03-27 ENCOUNTER — Emergency Department (HOSPITAL_COMMUNITY): Payer: Medicare Other

## 2016-03-27 DIAGNOSIS — I2510 Atherosclerotic heart disease of native coronary artery without angina pectoris: Secondary | ICD-10-CM | POA: Insufficient documentation

## 2016-03-27 DIAGNOSIS — Z87891 Personal history of nicotine dependence: Secondary | ICD-10-CM | POA: Diagnosis not present

## 2016-03-27 DIAGNOSIS — R0602 Shortness of breath: Secondary | ICD-10-CM | POA: Diagnosis present

## 2016-03-27 DIAGNOSIS — I10 Essential (primary) hypertension: Secondary | ICD-10-CM | POA: Diagnosis not present

## 2016-03-27 DIAGNOSIS — Z79899 Other long term (current) drug therapy: Secondary | ICD-10-CM | POA: Diagnosis not present

## 2016-03-27 DIAGNOSIS — J449 Chronic obstructive pulmonary disease, unspecified: Secondary | ICD-10-CM | POA: Diagnosis not present

## 2016-03-27 DIAGNOSIS — Z7982 Long term (current) use of aspirin: Secondary | ICD-10-CM | POA: Diagnosis not present

## 2016-03-27 LAB — CBC WITH DIFFERENTIAL/PLATELET
BASOS ABS: 0 10*3/uL (ref 0.0–0.1)
BASOS PCT: 0 %
Eosinophils Absolute: 0.1 10*3/uL (ref 0.0–0.7)
Eosinophils Relative: 1 %
HEMATOCRIT: 30.5 % — AB (ref 36.0–46.0)
HEMOGLOBIN: 10.2 g/dL — AB (ref 12.0–15.0)
LYMPHS PCT: 4 %
Lymphs Abs: 0.2 10*3/uL — ABNORMAL LOW (ref 0.7–4.0)
MCH: 32.9 pg (ref 26.0–34.0)
MCHC: 33.4 g/dL (ref 30.0–36.0)
MCV: 98.4 fL (ref 78.0–100.0)
MONOS PCT: 5 %
Monocytes Absolute: 0.3 10*3/uL (ref 0.1–1.0)
NEUTROS ABS: 5.4 10*3/uL (ref 1.7–7.7)
NEUTROS PCT: 90 %
Platelets: 96 10*3/uL — ABNORMAL LOW (ref 150–400)
RBC: 3.1 MIL/uL — ABNORMAL LOW (ref 3.87–5.11)
RDW: 17.4 % — ABNORMAL HIGH (ref 11.5–15.5)
WBC: 6 10*3/uL (ref 4.0–10.5)

## 2016-03-27 LAB — BASIC METABOLIC PANEL
Anion gap: 13 (ref 5–15)
BUN: 36 mg/dL — ABNORMAL HIGH (ref 6–20)
CHLORIDE: 99 mmol/L — AB (ref 101–111)
CO2: 25 mmol/L (ref 22–32)
Calcium: 7.8 mg/dL — ABNORMAL LOW (ref 8.9–10.3)
Creatinine, Ser: 1.16 mg/dL — ABNORMAL HIGH (ref 0.44–1.00)
GFR calc non Af Amer: 45 mL/min — ABNORMAL LOW (ref >60)
GFR, EST AFRICAN AMERICAN: 52 mL/min — AB (ref >60)
Glucose, Bld: 173 mg/dL — ABNORMAL HIGH (ref 65–99)
POTASSIUM: 3.9 mmol/L (ref 3.5–5.1)
Sodium: 137 mmol/L (ref 135–145)

## 2016-03-27 LAB — TROPONIN I
TROPONIN I: 0.03 ng/mL — AB (ref <0.03)
Troponin I: <0.03 ng/mL (ref <0.03)

## 2016-03-27 LAB — BRAIN NATRIURETIC PEPTIDE: B NATRIURETIC PEPTIDE 5: 210 pg/mL — AB (ref 0.0–100.0)

## 2016-03-27 NOTE — ED Triage Notes (Signed)
Pt sent over by Dr. Woody Seller to rule out CHF. Pt reports SOB without CP or leg swelling that began yesterday. Pt reports dyspnea with exertion and fatigue.

## 2016-03-27 NOTE — ED Notes (Signed)
CRITICAL VALUE ALERT  Critical value received:  Troponin 0.03  Date of notification:  03/27/16  Time of notification:  [id number]  Critical value read back:Yes.    Nurse who received alert:  Asencion Noble, RN  MD notified (1st page):  Lacinda Axon  Time of first page:  1740  MD notified (2nd page):  Time of second page:  Responding MD:  Lacinda Axon  Time MD responded:  [id number]

## 2016-03-27 NOTE — ED Notes (Signed)
Patient returned from X-ray 

## 2016-03-27 NOTE — ED Provider Notes (Signed)
[facility]-EMERGENCY DEPT Provider Note   CSN: [id number] Arrival date & time: 03/27/16  1540     History   Chief Complaint Chief Complaint  Patient presents with  . Shortness of Breath    HPI Carol Bradley is a 75 y.o. female.  Shortness of breath since chest today at 5 PM. No chest pain. Patient was recently diagnosed with an upper respiratory infection and placed on Levaquin. She has a history of CAD, CHF, stenting. She is able to walk around her house. No extremity edema. Past medical history includes COPD and tobacco.    Past Medical History:  Diagnosis Date  . CAD (coronary artery disease)   . COPD (chronic obstructive pulmonary disease) (HCC)   . History of tobacco abuse   . Hyperlipidemia   . Hypertension   . 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 Multiple myeloma [facility])     Patient Active Problem List   Diagnosis Date Noted  . 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 COPD (chronic obstructive pulmonary disease) (HCC) 01/15/2016  . Multiple myeloma (HCC) 04/12/2015  . Dyspnea on exertion 04/29/2014  . Dyspepsia 05/19/2011  . CAD (coronary artery disease)   . Hypertension   . Hyperlipidemia     Past Surgical History:  Procedure Laterality Date  . BACK SURGERY    . CARDIAC CATHETERIZATION  04/2011   right and left cath showing normal right heart pressures,but newly diagnosed coronary artery disease/drug eluting stent placed to RCA with residual disease in the proximal RCA and LAD and ramus, normal LV function and 60-65% EF  . COLONOSCOPY N/A 09/30/2014   Procedure: COLONOSCOPY;  Surgeon: Malissa Hippo, MD;  Location: [facility] ENDO SUITE;  Service: Endoscopy;  Laterality: N/A;  225  . CORONARY ANGIOPLASTY WITH STENT PLACEMENT  04/2011   mid RCA: 3.0 X38 mm Promus DES. Residual 40% disease proximally  . NECK SURGERY      OB History    No data available       Home Medications    Prior to Admission medications   Medication Sig Start Date End Date Taking?  Authorizing Provider  acyclovir (ZOVIRAX) 200 MG capsule Take 1 capsule (200 mg total) by mouth 2 (two) times daily. 01/21/16  Yes Ellouise Newer, PA-C  aspirin EC 81 MG tablet Take 81 mg by mouth daily.     Yes Historical Provider, MD  atorvastatin (LIPITOR) 10 MG tablet TAKE ONE TABLET BY MOUTH DAILY. 07/01/15  Yes Peter M [place], MD  Calcium Carb-Cholecalciferol (CALCIUM 600+D3 PO) Take 1 tablet by mouth 3 (three) times daily.   Yes Historical Provider, MD  dexamethasone (DECADRON) 4 MG tablet Take 10 tablets (40 mg total) by mouth every 7 (seven) days. Patient taking differently: Take 20 mg by mouth 2 (two) times a week. 5 tablets on Mondays and Fridays 01/12/16  Yes Maurine Minister Kefalas, PA-C  diazepam (VALIUM) 5 MG tablet Take 5 mg by mouth every 6 (six) hours as needed for anxiety.   Yes Historical Provider, MD  diltiazem (CARDIZEM CD) 240 MG 24 hr capsule TAKE ONE CAPSULE BY MOUTH DAILY 06/21/15  Yes Peter M [place], MD  fluticasone [facility]) 50 MCG/ACT nasal spray Place 1 spray into the nose as needed.   Yes Historical Provider, MD  lenalidomide (REVLIMID) 15 MG capsule Take 1 capsule (15 mg total) by mouth daily. Take 15 mg PO days 1-21 every 28 days 02/29/16  Yes Maurine Minister Kefalas, PA-C  levofloxacin (LEVAQUIN) 500 MG tablet TAKE ONE TABLET BY MOUTH  DAILY FOR 7 DAYS 03/23/16  Yes Historical Provider, MD  losartan (COZAAR) 100 MG tablet TAKE ONE TABLET BY MOUTH DAILY. 07/13/15  Yes Peter M Martinique, MD  magnesium oxide (MAG-OX) 400 MG tablet Take 400 mg by mouth daily. When taking lasix   Yes Historical Provider, MD  metoprolol tartrate (LOPRESSOR) 25 MG tablet TAKE ONE TABLET BY MOUTH TWICE DAILY 03/27/16  Yes Peter M Martinique, MD  potassium chloride (K-DUR,KLOR-CON) 10 MEQ tablet Take 10 mEq by mouth 3 (three) times daily. When taking lasix   Yes Historical Provider, MD  prochlorperazine (COMPAZINE) 10 MG tablet Take 10 mg by mouth every 6 (six) hours as needed for nausea or vomiting.   Yes Historical  Provider, MD  furosemide (LASIX) 20 MG tablet Take 20 mg by mouth daily as needed. For 7 days PRN swelling    Historical Provider, MD    Family History Family History  Problem Relation Age of Onset  . Heart failure Mother   . Cancer Father     Social History Social History  Substance Use Topics  . Smoking status: Former Smoker    Packs/day: 3.00    Years: 25.00    Types: Cigarettes    Quit date: 07/10/1994  . Smokeless tobacco: Never Used  . Alcohol use No     Allergies   Bortezomib; Lisinopril; and Plavix [clopidogrel bisulfate]   Review of Systems Review of Systems  All other systems reviewed and are negative.    Physical Exam Updated Vital Signs BP 120/57 (BP Location: Left Arm)   Pulse 75   Temp 97.9 F (36.6 C) (Oral)   Resp (!) 29   Ht '5\' 7"'$  (1.702 m)   Wt 150 lb (68 kg)   SpO2 95%   BMI 23.49 kg/m   Physical Exam  Constitutional: She is oriented to person, place, and time. She appears well-developed and well-nourished.  Patient appears well. No dyspnea or tachypnea  HENT:  Head: Normocephalic and atraumatic.  Eyes: Conjunctivae are normal.  Neck: Neck supple.  Cardiovascular: Normal rate and regular rhythm.   Pulmonary/Chest: Effort normal and breath sounds normal.  No rales.  Abdominal: Soft. Bowel sounds are normal.  Musculoskeletal: Normal range of motion.  Neurological: She is alert and oriented to person, place, and time.  Skin: Skin is warm and dry.  No peripheral edema.  Psychiatric: She has a normal mood and affect. Her behavior is normal.  Nursing note and vitals reviewed.    ED Treatments / Results  Labs (all labs ordered are listed, but only abnormal results are displayed) Labs Reviewed  CBC WITH DIFFERENTIAL/PLATELET - Abnormal; Notable for the following:       Result Value   RBC 3.10 (*)    Hemoglobin 10.2 (*)    HCT 30.5 (*)    RDW 17.4 (*)    Platelets 96 (*)    Lymphs Abs 0.2 (*)    All other components within normal  limits  BASIC METABOLIC PANEL - Abnormal; Notable for the following:    Chloride 99 (*)    Glucose, Bld 173 (*)    BUN 36 (*)    Creatinine, Ser 1.16 (*)    Calcium 7.8 (*)    GFR calc non Af Amer 45 (*)    GFR calc Af Amer 52 (*)    All other components within normal limits  TROPONIN I - Abnormal; Notable for the following:    Troponin I 0.03 (*)    All other  components within normal limits  BRAIN NATRIURETIC PEPTIDE - Abnormal; Notable for the following:    B Natriuretic Peptide 210.0 (*)    All other components within normal limits  TROPONIN I    EKG  EKG Interpretation None       Radiology Dg Chest 2 View  Result Date: 03/27/2016 CLINICAL DATA:  Shortness of breath. EXAM: CHEST  2 VIEW COMPARISON:  12/03/2015. FINDINGS: Mediastinum and hilar structures are normal. Mild lingular atelectasis and or infiltrate. Interim clearing of right upper lobe infiltrate. Cardiomegaly with normal pulmonary vascularity. Prior cervical spine fusion . IMPRESSION: Mild lingular atelectasis and/or infiltrate. Interim clearing of right upper lobe infiltrate. Electronically Signed   By: Marcello Moores  Register   On: 03/27/2016 16:45    Procedures Procedures (including critical care time)  Medications Ordered in ED Medications - No data to display   Initial Impression / Assessment and Plan / ED Course  I have reviewed the triage vital signs and the nursing notes.  Pertinent labs & imaging results that were available during my care of the patient were reviewed by me and considered in my medical decision making (see chart for details).  Clinical Course    Although patient claims she is dyspneic, she is alert with normal color and no dyspnea or tachypnea. Chest x-ray shows a possible mild lingular atelectasis and/or infiltrate. There is been interval clearing of the right upper lobe infiltrate.  Will continue Levaquin for now. Follow-up with primary care doctor. Doubt pulmonary embolism  Final  Clinical Impressions(s) / ED Diagnoses   Final diagnoses:  Shortness of breath    New Prescriptions New Prescriptions   No medications on file     Nat Christen, MD 03/27/16 2213

## 2016-03-27 NOTE — Discharge Instructions (Signed)
You may have a small amount of pneumonia in your right lung. Continue antibiotic. Follow-up your primary care doctor this week.

## 2016-03-28 ENCOUNTER — Other Ambulatory Visit (HOSPITAL_COMMUNITY): Payer: Self-pay | Admitting: Emergency Medicine

## 2016-03-28 DIAGNOSIS — C9000 Multiple myeloma not having achieved remission: Secondary | ICD-10-CM

## 2016-03-28 MED ORDER — LENALIDOMIDE 15 MG PO CAPS: 15.0000 mg | ORAL_CAPSULE | Freq: Every day | ORAL | 0 refills | Status: DC

## 2016-03-28 NOTE — Progress Notes (Signed)
revlimid refilled

## 2016-03-30 ENCOUNTER — Encounter (HOSPITAL_COMMUNITY): Payer: Medicare Other

## 2016-03-30 ENCOUNTER — Telehealth (HOSPITAL_COMMUNITY): Payer: Self-pay | Admitting: *Deleted

## 2016-03-30 DIAGNOSIS — C9001 Multiple myeloma in remission: Secondary | ICD-10-CM | POA: Diagnosis not present

## 2016-03-30 LAB — CBC WITH DIFFERENTIAL/PLATELET
BASOS ABS: 0 10*3/uL (ref 0.0–0.1)
Basophils Relative: 0 %
EOS ABS: 0.6 10*3/uL (ref 0.0–0.7)
EOS PCT: 11 %
HCT: 28.6 % — ABNORMAL LOW (ref 36.0–46.0)
Hemoglobin: 9.5 g/dL — ABNORMAL LOW (ref 12.0–15.0)
LYMPHS ABS: 0.3 10*3/uL — AB (ref 0.7–4.0)
Lymphocytes Relative: 5 %
MCH: 32.2 pg (ref 26.0–34.0)
MCHC: 33.2 g/dL (ref 30.0–36.0)
MCV: 96.9 fL (ref 78.0–100.0)
MONO ABS: 0.9 10*3/uL (ref 0.1–1.0)
Monocytes Relative: 15 %
Neutro Abs: 4.1 10*3/uL (ref 1.7–7.7)
Neutrophils Relative %: 69 %
PLATELETS: 94 10*3/uL — AB (ref 150–400)
RBC: 2.95 MIL/uL — AB (ref 3.87–5.11)
RDW: 17.9 % — AB (ref 11.5–15.5)
WBC: 5.9 10*3/uL (ref 4.0–10.5)

## 2016-03-30 LAB — COMPREHENSIVE METABOLIC PANEL
ALT: 26 U/L (ref 14–54)
AST: 23 U/L (ref 15–41)
Albumin: 2.6 g/dL — ABNORMAL LOW (ref 3.5–5.0)
Alkaline Phosphatase: 59 U/L (ref 38–126)
Anion gap: 7 (ref 5–15)
BUN: 35 mg/dL — ABNORMAL HIGH (ref 6–20)
CHLORIDE: 105 mmol/L (ref 101–111)
CO2: 26 mmol/L (ref 22–32)
Calcium: 7.9 mg/dL — ABNORMAL LOW (ref 8.9–10.3)
Creatinine, Ser: 1.21 mg/dL — ABNORMAL HIGH (ref 0.44–1.00)
GFR calc Af Amer: 49 mL/min — ABNORMAL LOW (ref >60)
GFR calc non Af Amer: 43 mL/min — ABNORMAL LOW (ref >60)
GLUCOSE: 117 mg/dL — AB (ref 65–99)
POTASSIUM: 3.4 mmol/L — AB (ref 3.5–5.1)
SODIUM: 138 mmol/L (ref 135–145)
Total Bilirubin: 0.7 mg/dL (ref 0.3–1.2)
Total Protein: 5.6 g/dL — ABNORMAL LOW (ref 6.5–8.1)

## 2016-03-31 NOTE — Telephone Encounter (Signed)
Pt aware to increase Kdur by 1 tablet a day for 7 days. Pt also aware to increase fluid intake. Pt verbalized understanding.

## 2016-03-31 NOTE — Telephone Encounter (Signed)
-----   Message from Baird Cancer, PA-C sent at 03/30/2016  5:42 PM EDT ----- Hypokalemia is noted.  Make sure she is taking Kdur.  Increase by 1 extra tablet daily x 7 days, then back to original dose.  Encourage increased PO H2O.

## 2016-04-11 ENCOUNTER — Other Ambulatory Visit: Payer: Self-pay | Admitting: Cardiology

## 2016-04-12 ENCOUNTER — Other Ambulatory Visit (HOSPITAL_COMMUNITY): Payer: Self-pay

## 2016-04-12 DIAGNOSIS — C9001 Multiple myeloma in remission: Secondary | ICD-10-CM

## 2016-04-13 ENCOUNTER — Telehealth (HOSPITAL_COMMUNITY): Payer: Self-pay | Admitting: Hematology & Oncology

## 2016-04-13 ENCOUNTER — Encounter (HOSPITAL_COMMUNITY): Payer: Medicare Other

## 2016-04-13 ENCOUNTER — Ambulatory Visit (HOSPITAL_COMMUNITY): Payer: Medicare Other

## 2016-04-13 ENCOUNTER — Encounter (HOSPITAL_COMMUNITY): Payer: Medicare Other | Attending: Hematology & Oncology | Admitting: Hematology & Oncology

## 2016-04-13 ENCOUNTER — Encounter (HOSPITAL_COMMUNITY): Payer: Self-pay | Admitting: Hematology & Oncology

## 2016-04-13 ENCOUNTER — Encounter (HOSPITAL_BASED_OUTPATIENT_CLINIC_OR_DEPARTMENT_OTHER): Payer: Medicare Other

## 2016-04-13 VITALS — BP 119/40 | HR 77 | Temp 97.7°F | Resp 18 | Wt 156.0 lb

## 2016-04-13 DIAGNOSIS — E538 Deficiency of other specified B group vitamins: Secondary | ICD-10-CM

## 2016-04-13 DIAGNOSIS — Z23 Encounter for immunization: Secondary | ICD-10-CM

## 2016-04-13 DIAGNOSIS — N183 Chronic kidney disease, stage 3 unspecified: Secondary | ICD-10-CM | POA: Diagnosis present

## 2016-04-13 DIAGNOSIS — C9001 Multiple myeloma in remission: Secondary | ICD-10-CM

## 2016-04-13 DIAGNOSIS — C9000 Multiple myeloma not having achieved remission: Secondary | ICD-10-CM

## 2016-04-13 DIAGNOSIS — D649 Anemia, unspecified: Secondary | ICD-10-CM

## 2016-04-13 DIAGNOSIS — D801 Nonfamilial hypogammaglobulinemia: Secondary | ICD-10-CM | POA: Diagnosis present

## 2016-04-13 DIAGNOSIS — D631 Anemia in chronic kidney disease: Secondary | ICD-10-CM | POA: Insufficient documentation

## 2016-04-13 DIAGNOSIS — Z7189 Other specified counseling: Secondary | ICD-10-CM

## 2016-04-13 HISTORY — DX: Chronic kidney disease, stage 3 unspecified: N18.30

## 2016-04-13 HISTORY — DX: Anemia in chronic kidney disease: D63.1

## 2016-04-13 LAB — COMPREHENSIVE METABOLIC PANEL
ALK PHOS: 61 U/L (ref 38–126)
ALT: 18 U/L (ref 14–54)
ANION GAP: 6 (ref 5–15)
AST: 21 U/L (ref 15–41)
Albumin: 2.8 g/dL — ABNORMAL LOW (ref 3.5–5.0)
BILIRUBIN TOTAL: 0.6 mg/dL (ref 0.3–1.2)
BUN: 33 mg/dL — ABNORMAL HIGH (ref 6–20)
CALCIUM: 8.1 mg/dL — AB (ref 8.9–10.3)
CO2: 25 mmol/L (ref 22–32)
CREATININE: 1.2 mg/dL — AB (ref 0.44–1.00)
Chloride: 104 mmol/L (ref 101–111)
GFR, EST AFRICAN AMERICAN: 50 mL/min — AB (ref >60)
GFR, EST NON AFRICAN AMERICAN: 43 mL/min — AB (ref >60)
Glucose, Bld: 116 mg/dL — ABNORMAL HIGH (ref 65–99)
Potassium: 3.9 mmol/L (ref 3.5–5.1)
SODIUM: 135 mmol/L (ref 135–145)
TOTAL PROTEIN: 5.8 g/dL — AB (ref 6.5–8.1)

## 2016-04-13 LAB — CBC WITH DIFFERENTIAL/PLATELET
BASOS PCT: 0 %
Basophils Absolute: 0 10*3/uL (ref 0.0–0.1)
EOS ABS: 0.1 10*3/uL (ref 0.0–0.7)
EOS PCT: 1 %
HCT: 26 % — ABNORMAL LOW (ref 36.0–46.0)
HEMOGLOBIN: 8.4 g/dL — AB (ref 12.0–15.0)
LYMPHS PCT: 13 %
Lymphs Abs: 1.2 10*3/uL (ref 0.7–4.0)
MCH: 32.3 pg (ref 26.0–34.0)
MCHC: 32.3 g/dL (ref 30.0–36.0)
MCV: 100 fL (ref 78.0–100.0)
MONO ABS: 1.3 10*3/uL — AB (ref 0.1–1.0)
Monocytes Relative: 14 %
NEUTROS ABS: 6.5 10*3/uL (ref 1.7–7.7)
Neutrophils Relative %: 72 %
Platelets: 225 10*3/uL (ref 150–400)
RBC: 2.6 MIL/uL — ABNORMAL LOW (ref 3.87–5.11)
RDW: 18.7 % — ABNORMAL HIGH (ref 11.5–15.5)
WBC: 9.1 10*3/uL (ref 4.0–10.5)

## 2016-04-13 LAB — SEDIMENTATION RATE: SED RATE: 94 mm/h — AB (ref 0–22)

## 2016-04-13 LAB — C-REACTIVE PROTEIN: CRP: 1 mg/dL — AB (ref <1.0)

## 2016-04-13 LAB — ABO/RH: ABO/RH(D): O POS

## 2016-04-13 LAB — FERRITIN: FERRITIN: 122 ng/mL (ref 11–307)

## 2016-04-13 LAB — FOLATE: Folate: 10.8 ng/mL (ref >5.9)

## 2016-04-13 LAB — VITAMIN B12

## 2016-04-13 LAB — SAMPLE TO BLOOD BANK

## 2016-04-13 LAB — PREPARE RBC (CROSSMATCH)

## 2016-04-13 LAB — LACTATE DEHYDROGENASE: LDH: 184 U/L (ref 98–192)

## 2016-04-13 MED ORDER — DIPHENHYDRAMINE HCL 25 MG PO CAPS
25.0000 mg | ORAL_CAPSULE | ORAL | Status: AC | PRN
  Administered 2016-04-13: 25 mg via ORAL

## 2016-04-13 MED ORDER — LENALIDOMIDE 5 MG PO CAPS: 5.0000 mg | ORAL_CAPSULE | Freq: Every day | ORAL | 6 refills | Status: DC

## 2016-04-13 MED ORDER — ZOLEDRONIC ACID 4 MG/5ML IV CONC
3.3000 mg | Freq: Once | INTRAVENOUS | Status: AC
  Administered 2016-04-13: 3.3 mg via INTRAVENOUS
  Filled 2016-04-13: qty 4.13

## 2016-04-13 MED ORDER — IMMUNE GLOBULIN (HUMAN) 10 GM/100ML IV SOLN
50.0000 g | Freq: Once | INTRAVENOUS | Status: AC
  Administered 2016-04-13: 50 g via INTRAVENOUS
  Filled 2016-04-13: qty 200

## 2016-04-13 MED ORDER — SODIUM CHLORIDE 0.9 % IV SOLN
INTRAVENOUS | Status: DC
  Administered 2016-04-13: 14:00:00 via INTRAVENOUS

## 2016-04-13 MED ORDER — INFLUENZA VAC SPLIT QUAD 0.5 ML IM SUSY
0.5000 mL | PREFILLED_SYRINGE | Freq: Once | INTRAMUSCULAR | Status: AC
  Administered 2016-04-13: 0.5 mL via INTRAMUSCULAR
  Filled 2016-04-13: qty 0.5

## 2016-04-13 MED ORDER — ACETAMINOPHEN 325 MG PO TABS
650.0000 mg | ORAL_TABLET | Freq: Four times a day (QID) | ORAL | Status: DC | PRN
  Administered 2016-04-13: 650 mg via ORAL

## 2016-04-13 MED ORDER — ACETAMINOPHEN 325 MG PO TABS
ORAL_TABLET | ORAL | Status: AC
  Filled 2016-04-13: qty 2

## 2016-04-13 MED ORDER — CYANOCOBALAMIN 1000 MCG/ML IJ SOLN
1000.0000 ug | Freq: Once | INTRAMUSCULAR | Status: AC
  Administered 2016-04-13: 1000 ug via INTRAMUSCULAR
  Filled 2016-04-13: qty 1

## 2016-04-13 MED ORDER — DIPHENHYDRAMINE HCL 25 MG PO CAPS
ORAL_CAPSULE | ORAL | Status: AC
  Filled 2016-04-13: qty 1

## 2016-04-13 MED ORDER — DEXTROSE 5 % IV SOLN
INTRAVENOUS | Status: DC
  Administered 2016-04-13: 11:00:00 via INTRAVENOUS

## 2016-04-13 NOTE — Progress Notes (Signed)
See infusion encounter.  

## 2016-04-13 NOTE — Telephone Encounter (Signed)
Faxed new dose reduced revlimid rx to [facility]

## 2016-04-13 NOTE — Progress Notes (Signed)
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       03/18/2015 - 07/28/2015 Chemotherapy    Velcade 1.6 mg/m2 discontinued secondary to intolerance      07/28/2015 Adverse Reaction    stool incontinence, weakness, collapse upon standing, felt to be secondary to velcade      11/09/2015 - 12/13/2015 Chemotherapy    cytoxan IV 300 mg/m2 administered X 2 doses only in addition to rev/dex      11/09/2015 -  Chemotherapy    Revlimid/Dexamethasone        HISTORY OF PRESENTING ILLNESS:  Carol Bradley 75 y.o. female is here for a follow-up of Multiple Myeloma.  Based upon peripheral labs, she remains in remission. Details of therapy are noted above in oncology history.   Patient is accompanied by her son today. She says she is concerned because she has not been feeling well. Carol Bradley has COPD and states that she understands this is likely causing her SOB. She follows with pulmonary. Overall, however, she reports feeling worse than she did six months ago. She notes that she is too tired all of the time. She does report "good days and bad days" but the bad days are more in number.   Carol Bradley son states his mother sleeps 75% of the day and he feels her quality of life is very poor. He says that his mother can tell when her blood counts get low. When she receives blood and her counts get to 10 or 11, he says his mother "can run a marathon." Her son is also concerned that his mother repeatedly has urinary tract infections and pneumonia.   Patient denies hematochezia or black, tarry stool.  Currently, no fever or cough.   MEDICAL HISTORY:  Past Medical  History:  Diagnosis Date  . CAD (coronary artery disease)   . COPD (chronic obstructive pulmonary disease) ([place])   . History of tobacco abuse   . Hyperlipidemia   . Hypertension   . Hypogammaglobulinemia ([place]) 01/15/2016  . Multiple myeloma ([place])     SURGICAL HISTORY: Past Surgical History:  Procedure Laterality Date  . BACK SURGERY    . CARDIAC CATHETERIZATION  04/2011   right and left cath showing normal right heart pressures,but newly diagnosed coronary artery disease/drug eluting stent placed to RCA with residual disease in the proximal RCA and LAD and ramus, normal LV function and 60-65% EF  . COLONOSCOPY N/A 09/30/2014   Procedure: COLONOSCOPY;  Surgeon: Carol Houston, MD;  Location: [facility] ENDO SUITE;  Service: Endoscopy;  Laterality: N/A;  225  . CORONARY ANGIOPLASTY WITH STENT PLACEMENT  04/2011   mid RCA: 3.0 X38 mm Promus DES. Residual 40% disease proximally  . NECK SURGERY      SOCIAL HISTORY: Social History   Social History  . Marital status: Married    Spouse name: N/A  . Number of children: N/A  . Years of education: N/A   Occupational History  . 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 Smoking status: Former Smoker    Packs/day: 3.00    Years: 25.00  Types: Cigarettes    Quit date: 07/10/1994  . Smokeless tobacco: Never Used  . Alcohol use No  . Drug use: Unknown  . Sexual activity: Not on file   Other Topics Concern  . Not on file   Social History Narrative  . No narrative on file   Born in [place], [place]. Widowed; had been married 58 years.  Sister in law Carol Bradley; their right hand to be with her when they can't be here. Son Carol Bradley and his wife Carol Bradley live in [place]; always here for the major stuff Only the one son, no grandchildren.  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 Hobbies include gardening in the flowers, church work, lots of volunteer work in the community Volunteered at  [employer], [employer] So her diagnosis has been a huge life change, since she was so active in the community. Loves her flower garden; has a pond with a flower in it, wind chimes, flowers.  Smoked, quit in '96. No problems with alcohol.  FAMILY HISTORY: Family History  Problem Relation Age of Onset  . Heart failure Mother   . Cancer Father    Mother would have been 23 in 09-Sep-2022; died in 2023-06-10 She was very healthy; mind and memory really good; active until the last 4-5 months of her life. Died of congestive heart failure; had lung and breathing problems; her age was the main issue, "she just wore out." Father died at age 60. He had appendicitis. Had half of his stomach removed prior to the appendicitis; that's what started it.  5 brothers and 1 sister. 1 brother deceased with lung cancer, 2 or 3 years ago. He was a smoker. Everyone else is still living, in as good of health "as their age will allow them to be." No one else with myeloma.   ALLERGIES:  is allergic to bortezomib; lisinopril; and plavix [clopidogrel bisulfate].  MEDICATIONS:  Current Outpatient Prescriptions  Medication Sig Dispense Refill  . acyclovir (ZOVIRAX) 200 MG capsule Take 1 capsule (200 mg total) by mouth 2 (two) times daily. 180 capsule 1  . aspirin EC 81 MG tablet Take 81 mg by mouth daily.      Marland Kitchen atorvastatin (LIPITOR) 10 MG tablet Take 1 tablet (10 mg total) by mouth daily. 30 tablet 0  . Calcium Carb-Cholecalciferol (CALCIUM 600+D3 PO) Take 1 tablet by mouth 3 (three) times daily.    Marland Kitchen dexamethasone (DECADRON) 4 MG tablet Take 10 tablets (40 mg total) by mouth every 7 (seven) days. (Patient taking differently: Take 20 mg by mouth 2 (two) times a week. 5 tablets on Mondays and Fridays) 40 tablet 3  . diazepam (VALIUM) 5 MG tablet Take 5 mg by mouth every 6 (six) hours as needed for anxiety.    Marland Kitchen diltiazem (CARDIZEM CD) 240 MG 24 hr capsule TAKE ONE CAPSULE BY MOUTH DAILY 30 capsule 11  .  fluticasone (FLONASE) 50 MCG/ACT nasal spray Place 1 spray into the nose as needed.    . furosemide (LASIX) 20 MG tablet Take 20 mg by mouth daily as needed. For 7 days PRN swelling    . lenalidomide (REVLIMID) 5 MG capsule Take 1 capsule (5 mg total) by mouth daily. 28 capsule 6  . levofloxacin (LEVAQUIN) 500 MG tablet TAKE ONE TABLET BY MOUTH DAILY FOR 7 DAYS  0  . losartan (COZAAR) 100 MG tablet Take 1 tablet (100 mg total) by mouth daily. 30 tablet 0  . magnesium  oxide (MAG-OX) 400 MG tablet Take 400 mg by mouth daily. When taking lasix    . metoprolol tartrate (LOPRESSOR) 25 MG tablet TAKE ONE TABLET BY MOUTH TWICE DAILY 180 tablet 0  . potassium chloride (K-DUR,KLOR-CON) 10 MEQ tablet Take 10 mEq by mouth 3 (three) times daily. When taking lasix    . prochlorperazine (COMPAZINE) 10 MG tablet Take 10 mg by mouth every 6 (six) hours as needed for nausea or vomiting.     No current facility-administered medications for this visit.    Facility-Administered Medications Ordered in Other Visits  Medication Dose Route Frequency Provider Last Rate Last Dose  . 0.9 %  sodium chloride infusion   Intravenous Continuous Baird Cancer, PA-C   Stopped at 04/13/16 1420  . acetaminophen (TYLENOL) tablet 650 mg  650 mg Oral Q6H PRN Patrici Ranks, MD   650 mg at 04/13/16 0934  . dextrose 5 % solution   Intravenous Continuous Baird Cancer, PA-C   Stopped at 04/13/16 1357    Review of Systems  Constitutional: Positive for malaise/fatigue.       Patient is fatigued and sleeps 75% of the day.  HENT: Negative for congestion, ear discharge, ear pain, hearing loss, nosebleeds, sore throat and tinnitus.   Eyes: Negative.  Negative for blurred vision, double vision, photophobia, pain, discharge and redness.  Respiratory: Positive for shortness of breath. Negative for stridor.   Cardiovascular: Positive for orthopnea. Negative for chest pain and palpitations.  Gastrointestinal: Negative.  Negative for  abdominal pain, blood in stool, constipation, diarrhea, heartburn, melena, nausea and vomiting.  Genitourinary: Negative.  Negative for dysuria, flank pain, frequency, hematuria and urgency.  Musculoskeletal: Negative.  Negative for back pain, falls, joint pain, myalgias and neck pain.  Skin: Negative.  Negative for itching and rash.  Neurological: Positive for weakness. Negative for dizziness, tingling, tremors, sensory change, speech change, focal weakness, seizures, loss of consciousness and headaches.  Endo/Heme/Allergies: Negative.   Psychiatric/Behavioral: Negative.   All other systems reviewed and are negative.  14 point ROS was done and is otherwise as detailed above or in HPI   PHYSICAL EXAMINATION: ECOG PERFORMANCE STATUS: 2 - Symptomatic, <50% confined to bed  Vitals with BMI 04/13/2016  Height   Weight 156 lbs  BMI   Systolic 505  Diastolic 49  Pulse 82  Respirations 18    Physical Exam  Constitutional: She is oriented to person, place, and time and well-developed, well-nourished, and in no distress. No distress.  Onto exam table with assistance  HENT:  Head: Normocephalic and atraumatic.  Mouth/Throat: Oropharynx is clear and moist. No oropharyngeal exudate.  Eyes: Conjunctivae and EOM are normal. Pupils are equal, round, and reactive to light. Right eye exhibits no discharge. Left eye exhibits no discharge. No scleral icterus.  Neck: Normal range of motion. Neck supple. No thyromegaly present.  Cardiovascular: Normal rate, regular rhythm, normal heart sounds and intact distal pulses.   No murmur heard. Pulmonary/Chest: Effort normal and breath sounds normal. No respiratory distress. She has no wheezes.  Abdominal: Soft. Bowel sounds are normal.  Musculoskeletal: Normal range of motion. She exhibits no edema.  Lymphadenopathy:    She has no cervical adenopathy.  Neurological: She is alert and oriented to person, place, and time. No cranial nerve deficit.  Skin:  Skin is warm and dry. No rash noted. She is not diaphoretic. No erythema.  Psychiatric: Mood, memory, affect and judgment normal.  Nursing note and vitals reviewed.   LABORATORY DATA:  I have reviewed the data as listed Lab Results  Component Value Date   WBC 9.1 04/13/2016   HGB 8.4 (L) 04/13/2016   HCT 26.0 (L) 04/13/2016   MCV 100.0 04/13/2016   PLT 225 04/13/2016   CMP     Component Value Date/Time   NA 135 04/13/2016 0850   K 3.9 04/13/2016 0850   CL 104 04/13/2016 0850   CO2 25 04/13/2016 0850   GLUCOSE 116 (H) 04/13/2016 0850   BUN 33 (H) 04/13/2016 0850   CREATININE 1.20 (H) 04/13/2016 0850   CALCIUM 8.1 (L) 04/13/2016 0850   PROT 5.8 (L) 04/13/2016 0850   ALBUMIN 2.8 (L) 04/13/2016 0850   AST 21 04/13/2016 0850   ALT 18 04/13/2016 0850   ALKPHOS 61 04/13/2016 0850   BILITOT 0.6 04/13/2016 0850   GFRNONAA 43 (L) 04/13/2016 0850   GFRAA 50 (L) 04/13/2016 0850    RADIOGRAPHIC STUDIES: I have personally reviewed the radiological images as listed and agreed with the findings in the report. CLINICAL DATA:  Initial treatment strategy for multiple myeloma.  EXAM: NUCLEAR MEDICINE PET WHOLE BODY  TECHNIQUE: 7.54 mCi F-18 FDG was injected intravenously. Full-ring PET imaging was performed from the vertex to the feet after the radiotracer. CT data was obtained and used for attenuation correction and anatomic localization.  FASTING BLOOD GLUCOSE:  Value:  93 mg/dl  COMPARISON:  Abdominal pelvic CT 10/07/2014.  FINDINGS: NECK  No hypermetabolic cervical lymph nodes are identified.There are no lesions of the pharyngeal mucosal space. Carotid atherosclerosis noted bilaterally.  CHEST  There are no hypermetabolic mediastinal, hilar or axillary lymph nodes. There is no suspicious pulmonary activity. Moderate emphysematous changes are present throughout the lungs. There is atherosclerosis of the aorta, great vessels and coronary  arteries.  ABDOMEN/PELVIS  There is no hypermetabolic activity within the liver, adrenal glands, spleen or pancreas. There is no hypermetabolic nodal activity. There is a left renal cysts, aortoiliac atherosclerosis and sigmoid diverticulosis.  SKELETON  There is no hypermetabolic activity to suggest osseous metastatic disease. There are mottled lucencies throughout the axial skeleton without focal lytic lesion or pathologic fracture. Lower cervical fusion noted.  Extremities: No hypermetabolic activity to suggest metastasis. There is fatty atrophy within left lower leg gastrocnemius and soleus musculature.  IMPRESSION: 1. No evidence of metabolically active multiple myeloma in the neck, chest, abdomen, pelvis or extremities. 2. No other acute or significant findings. Chronic lung disease and atherosclerosis as described.   Electronically Signed   By: Richardean Sale M.D.   On: 03/05/2015 14:59   ASSESSMENT & PLAN:  IgG kappa Myeloma Anemia Fatigue Hypogammaglobulenemia Stage 3 CKD B12 deficiency Marked elevation of ESR Stool incontinence on Velcade Severe COPD from prior tobacco abuse Diastolic CHF  Hypogammaglobulinemia (HCC) Hypogammamglobulinemia, on IVIG monthly.  Labs reviewed: IgG, IgA, IgM.  I personally reviewed and went over laboratory results with the patient.  The results are noted within this dictation.  Based upon her IgG level, will increase IVIG dose.  Supportive therapy plan is reviewed and updated accordingly.  Labs in 4 weeks: IgG, IgA, IgM.  Multiple myeloma (HCC) IgG kappa Myeloma, on Revlimid/Dexamethasone and taking ASA 325 mg daily.  Labs today: CBC diff, CMET, myeloma panel.  I personally reviewed and went over laboratory results with the patient.  The results are noted within this dictation.  Last set of myeloma studies reviewed with the patient and her son, remission noted.  Labs in 4 weeks: CBC diff, CMET, ESR, CRP,  SPEP+IFE, IgG, IgA, IgM, light chain assay, B2M  Zometa given today. Corrected calcium WNL. Have reduced zometa for creatinine clearance.   Return in 4 weeks for follow-up.    Symptomatic anemia At this point I feel her anemia is secondary to revlimid. I do not feel the patient, given her extreme fatigue, ongoing transfusion requirements and evidence of remission needs to continue on current revlimid dosing. Both the patient and her son note poor quality.    Will have her finish current cycle of revlimid QOD. Have written for revlimid "maintenance" 5 mg daily. Will begin to decrease dexamethasone to 20 mg weekly and ultimately discontinue. Labs will be monitored closely as well as PS  May consider renal dosing aranesp in future if needed  B12 deficiency Continue with B12 replacement monthly  Goals of care, counseling/discussion These will need to be explicitly addressed at follow-up with the patient and family.    I will follow up with the patient on 05/11/16.  All questions were answered. The patient knows to call the clinic with any problems, questions or concerns.  This document serves as a record of services personally performed by Ancil Linsey, MD. It was created on her behalf by Elmyra Ricks, a trained medical scribe. The creation of this record is based on the scribe's personal observations and the provider's statements to them. This document has been checked and approved by the attending provider.  I have reviewed the above documentation for accuracy and completeness and I agree with the above.  This note was electronically signed.  Molli Hazard, MD  04/13/2016 6:21 PM

## 2016-04-13 NOTE — Patient Instructions (Signed)
[place] at [facility] Discharge Instructions  RECOMMENDATIONS MADE BY THE CONSULTANT AND ANY TEST RESULTS WILL BE SENT TO YOUR REFERRING PHYSICIAN.  We are going to schedule you to have 2 units of blood tomorrow.   Reduce your Revlimid to every other day for the remainder of this cycle. Once you start your new cycle you will take it every day, without a break, and your dose will be 5mg .   Also, reduce your dexamethasone to 20mg  every week.   You will have your labs checked in 2 weeks for follow up on low blood levels.   You will return in 4 weeks for IVIG, Zometa, labs, and to see Dr. Maurica Omura Muse.   Please see Amy as you leave for all of your appointments.    Thank you for choosing [place] at [facility] to provide your oncology and hematology care.  To afford each patient quality time with our provider, please arrive at least 15 minutes before your scheduled appointment time.   Beginning January 23rd 2017 lab work for the [name] will be done in the  Main lab at [facility] on 1st floor. If you have a lab appointment with the [facility] please come in thru the  Main Entrance and check in at the main information desk  You need to re-schedule your appointment should you arrive 10 or more minutes late.  We strive to give you quality time with our providers, and arriving late affects you and other patients whose appointments are after yours.  Also, if you no show three or more times for appointments you may be dismissed from the clinic at the providers discretion.     Again, thank you for choosing [facility].  Our hope is that these requests will decrease the amount of time that you wait before being seen by our physicians.       _____________________________________________________________  Should you have questions after your visit to [facility], please contact our office at [phone] between the hours of  8:30 a.m. and 4:30 p.m.  Voicemails left after 4:30 p.m. will not be returned until the following business day.  For prescription refill requests, have your pharmacy contact our office.         Resources For Cancer Patients and their Caregivers ? American Cancer Society: Can assist with transportation, wigs, general needs, runs Look Good Feel Better.        ([phone] [phone] ? Cancer Care: Provides financial assistance, online support groups, medication/co-pay assistance.  1-800-813-HOPE 636-234-1603) ? [place] Assists [place] cancer patients and their families through emotional , educational and financial support.  914 247 2328 ? [place] DSS Where to apply for food stamps, Medicaid and utility assistance. 2098326553 ? RCATS: Transportation to medical appointments. 802-357-9574 ? Social Security Administration: May apply for disability if have a Stage IV cancer. 432-280-0856 (817) 192-9015 ? [employer], Disability and Transit Services: Assists with nutrition, care and transit needs. [place] Support Programs: @10RELATIVEDAYS @ > Cancer Support Group  2nd Tuesday of the month 1pm-2pm, Journey Room  > Creative Journey  3rd Tuesday of the month 1130am-1pm, Journey Room  > Look Good Feel Better  1st Wednesday of the month 10am-12 noon, Journey Room (Call [place] to register [phone])

## 2016-04-13 NOTE — Progress Notes (Signed)
Chasady Maloney Schmader presents today for injection per the provider's orders.  Fluarix and B12 administrations without incident; see MAR for injection details.  Patient tolerated procedure well and without incident.  No questions or complaints noted at this time.  Tolerated infusions w/o adverse reaction.  Alert, in no distress.  VSS.  Discharged ambulatory in c/o family.

## 2016-04-13 NOTE — Patient Instructions (Signed)
[place] at [facility] Discharge Instructions  RECOMMENDATIONS MADE BY THE CONSULTANT AND ANY TEST RESULTS WILL BE SENT TO YOUR REFERRING PHYSICIAN.  IVIG infusion today. B12 injection today. Influenza vaccination today. Zometa infusion today. Return as scheduled for blood transfusion. Return as scheduled for IVIG and Zometa infusions.   Thank you for choosing [place] at [facility] to provide your oncology and hematology care.  To afford each patient quality time with our provider, please arrive at least 15 minutes before your scheduled appointment time.   Beginning January 23rd 2017 lab work for the [name] will be done in the  Main lab at [facility] on 1st floor. If you have a lab appointment with the [facility] please come in thru the  Main Entrance and check in at the main information desk  You need to re-schedule your appointment should you arrive 10 or more minutes late.  We strive to give you quality time with our providers, and arriving late affects you and other patients whose appointments are after yours.  Also, if you no show three or more times for appointments you may be dismissed from the clinic at the providers discretion.     Again, thank you for choosing [facility].  Our hope is that these requests will decrease the amount of time that you wait before being seen by our physicians.       _____________________________________________________________  Should you have questions after your visit to [facility], please contact our office at [phone] between the hours of 8:30 a.m. and 4:30 p.m.  Voicemails left after 4:30 p.m. will not be returned until the following business day.  For prescription refill requests, have your pharmacy contact our office.         Resources For Cancer Patients and their Caregivers ? American Cancer Society: Can assist with transportation, wigs,  general needs, runs Look Good Feel Better.        629-447-2432 ? Cancer Care: Provides financial assistance, online support groups, medication/co-pay assistance.  1-800-813-HOPE 479-360-7230) ? [place] Assists [place] cancer patients and their families through emotional , educational and financial support.  9200417552 ? [place] DSS Where to apply for food stamps, Medicaid and utility assistance. 7621877830 ? RCATS: Transportation to medical appointments. 640-739-1836 ? Social Security Administration: May apply for disability if have a Stage IV cancer. 305-803-4762 [phone] ? [employer], Disability and Transit Services: Assists with nutrition, care and transit needs. [place] Support Programs: @10RELATIVEDAYS @ > Cancer Support Group  2nd Tuesday of the month 1pm-2pm, Journey Room  > Creative Journey  3rd Tuesday of the month 1130am-1pm, Journey Room  > Look Good Feel Better  1st Wednesday of the month 10am-12 noon, Journey Room (Call [place] to register [phone])

## 2016-04-13 NOTE — Progress Notes (Signed)
Spoke with Judeen Hammans in blood bank to confirm they had the sample to blood bank as well as all orders needed for blood tomorrow.  She confirmed.

## 2016-04-14 ENCOUNTER — Observation Stay (HOSPITAL_BASED_OUTPATIENT_CLINIC_OR_DEPARTMENT_OTHER)
Admission: AD | Admit: 2016-04-14 | Discharge: 2016-04-14 | Disposition: A | Discharge status: Home / Self Care | Payer: Medicare Other | Source: Ambulatory Visit | Attending: Internal Medicine | Admitting: Internal Medicine

## 2016-04-14 DIAGNOSIS — C9001 Multiple myeloma in remission: Secondary | ICD-10-CM | POA: Insufficient documentation

## 2016-04-14 DIAGNOSIS — D801 Nonfamilial hypogammaglobulinemia: Secondary | ICD-10-CM

## 2016-04-14 DIAGNOSIS — J449 Chronic obstructive pulmonary disease, unspecified: Secondary | ICD-10-CM

## 2016-04-14 DIAGNOSIS — E785 Hyperlipidemia, unspecified: Secondary | ICD-10-CM

## 2016-04-14 DIAGNOSIS — I10 Essential (primary) hypertension: Secondary | ICD-10-CM

## 2016-04-14 DIAGNOSIS — Z79899 Other long term (current) drug therapy: Secondary | ICD-10-CM

## 2016-04-14 DIAGNOSIS — D631 Anemia in chronic kidney disease: Secondary | ICD-10-CM | POA: Diagnosis present

## 2016-04-14 DIAGNOSIS — D649 Anemia, unspecified: Secondary | ICD-10-CM | POA: Insufficient documentation

## 2016-04-14 DIAGNOSIS — D638 Anemia in other chronic diseases classified elsewhere: Secondary | ICD-10-CM | POA: Diagnosis present

## 2016-04-14 DIAGNOSIS — N183 Chronic kidney disease, stage 3 (moderate): Secondary | ICD-10-CM

## 2016-04-14 DIAGNOSIS — C9000 Multiple myeloma not having achieved remission: Secondary | ICD-10-CM | POA: Diagnosis not present

## 2016-04-14 DIAGNOSIS — Z87891 Personal history of nicotine dependence: Secondary | ICD-10-CM | POA: Insufficient documentation

## 2016-04-14 DIAGNOSIS — I2510 Atherosclerotic heart disease of native coronary artery without angina pectoris: Secondary | ICD-10-CM

## 2016-04-14 DIAGNOSIS — Z7982 Long term (current) use of aspirin: Secondary | ICD-10-CM

## 2016-04-14 DIAGNOSIS — J439 Emphysema, unspecified: Secondary | ICD-10-CM | POA: Diagnosis present

## 2016-04-14 LAB — PROTEIN ELECTROPHORESIS, SERUM
A/G RATIO SPE: 0.9 (ref 0.7–1.7)
ALBUMIN ELP: 2.5 g/dL — AB (ref 2.9–4.4)
ALPHA-2-GLOBULIN: 0.9 g/dL (ref 0.4–1.0)
Alpha-1-Globulin: 0.3 g/dL (ref 0.0–0.4)
BETA GLOBULIN: 0.8 g/dL (ref 0.7–1.3)
GLOBULIN, TOTAL: 2.7 g/dL (ref 2.2–3.9)
Gamma Globulin: 0.7 g/dL (ref 0.4–1.8)
Total Protein ELP: 5.2 g/dL — ABNORMAL LOW (ref 6.0–8.5)

## 2016-04-14 LAB — KAPPA/LAMBDA LIGHT CHAINS
KAPPA, LAMDA LIGHT CHAIN RATIO: 0.93 (ref 0.26–1.65)
Kappa free light chain: 13 mg/L (ref 3.3–19.4)
LAMDA FREE LIGHT CHAINS: 14 mg/L (ref 5.7–26.3)

## 2016-04-14 LAB — BETA 2 MICROGLOBULIN, SERUM: Beta-2 Microglobulin: 2.6 mg/L — ABNORMAL HIGH (ref 0.6–2.4)

## 2016-04-14 LAB — IGG, IGA, IGM
IGM, SERUM: 95 mg/dL (ref 26–217)
IgA: 104 mg/dL (ref 64–422)
IgG (Immunoglobin G), Serum: 580 mg/dL — ABNORMAL LOW (ref 700–1600)

## 2016-04-14 MED ORDER — DIPHENHYDRAMINE HCL 25 MG PO CAPS
25.0000 mg | ORAL_CAPSULE | Freq: Once | ORAL | Status: AC
  Administered 2016-04-14: 25 mg via ORAL
  Filled 2016-04-14: qty 1

## 2016-04-14 MED ORDER — SODIUM CHLORIDE 0.9 % IV SOLN: 250.0000 mL | Freq: Once | INTRAVENOUS | Status: DC

## 2016-04-14 MED ORDER — SODIUM CHLORIDE 0.9 % IV SOLN: 250.0000 mL | INTRAVENOUS | Status: DC | PRN

## 2016-04-14 MED ORDER — LENALIDOMIDE 5 MG PO CAPS: 5.0000 mg | ORAL_CAPSULE | Freq: Every day | ORAL | Status: DC

## 2016-04-14 MED ORDER — HEPARIN SOD (PORK) LOCK FLUSH 100 UNIT/ML IV SOLN: 500.0000 [IU] | Freq: Every day | INTRAVENOUS | Status: DC | PRN

## 2016-04-14 MED ORDER — METOPROLOL TARTRATE 25 MG PO TABS: 25.0000 mg | ORAL_TABLET | Freq: Two times a day (BID) | ORAL | Status: DC

## 2016-04-14 MED ORDER — SODIUM CHLORIDE 0.9% FLUSH: 3.0000 mL | INTRAVENOUS | Status: DC | PRN

## 2016-04-14 MED ORDER — ACETAMINOPHEN 325 MG PO TABS: 650.0000 mg | ORAL_TABLET | Freq: Four times a day (QID) | ORAL | Status: DC | PRN

## 2016-04-14 MED ORDER — DILTIAZEM HCL ER COATED BEADS 240 MG PO CP24: 240.0000 mg | ORAL_CAPSULE | Freq: Every day | ORAL | Status: DC

## 2016-04-14 MED ORDER — LOSARTAN POTASSIUM 50 MG PO TABS: 100.0000 mg | ORAL_TABLET | Freq: Every day | ORAL | Status: DC

## 2016-04-14 MED ORDER — MAGNESIUM OXIDE 400 (241.3 MG) MG PO TABS: 400.0000 mg | ORAL_TABLET | Freq: Every day | ORAL | Status: DC

## 2016-04-14 MED ORDER — ACYCLOVIR 200 MG PO CAPS: 200.0000 mg | ORAL_CAPSULE | Freq: Two times a day (BID) | ORAL | Status: DC

## 2016-04-14 MED ORDER — DIAZEPAM 5 MG PO TABS: 5.0000 mg | ORAL_TABLET | Freq: Four times a day (QID) | ORAL | Status: DC | PRN

## 2016-04-14 MED ORDER — SODIUM CHLORIDE 0.9% FLUSH: 10.0000 mL | INTRAVENOUS | Status: DC | PRN

## 2016-04-14 MED ORDER — ASPIRIN EC 81 MG PO TBEC
81.0000 mg | DELAYED_RELEASE_TABLET | Freq: Every day | ORAL | Status: DC
Start: 2016-04-14 — End: 2016-04-14

## 2016-04-14 MED ORDER — ACETAMINOPHEN 650 MG RE SUPP: 650.0000 mg | Freq: Four times a day (QID) | RECTAL | Status: DC | PRN

## 2016-04-14 MED ORDER — POTASSIUM CHLORIDE CRYS ER 10 MEQ PO TBCR: 10.0000 meq | EXTENDED_RELEASE_TABLET | Freq: Three times a day (TID) | ORAL | Status: DC

## 2016-04-14 MED ORDER — ACETAMINOPHEN 325 MG PO TABS
650.0000 mg | ORAL_TABLET | Freq: Once | ORAL | Status: AC
  Administered 2016-04-14: 650 mg via ORAL
  Filled 2016-04-14: qty 2

## 2016-04-14 MED ORDER — SODIUM CHLORIDE 0.9% FLUSH: 3.0000 mL | Freq: Two times a day (BID) | INTRAVENOUS | Status: DC

## 2016-04-14 MED ORDER — ONDANSETRON HCL 4 MG PO TABS: 4.0000 mg | ORAL_TABLET | Freq: Four times a day (QID) | ORAL | Status: DC | PRN

## 2016-04-14 MED ORDER — ONDANSETRON HCL 4 MG/2ML IJ SOLN: 4.0000 mg | Freq: Four times a day (QID) | INTRAMUSCULAR | Status: DC | PRN

## 2016-04-14 MED ORDER — ATORVASTATIN CALCIUM 10 MG PO TABS: 10.0000 mg | ORAL_TABLET | Freq: Every day | ORAL | Status: DC

## 2016-04-14 MED ORDER — DEXAMETHASONE 4 MG PO TABS
20.0000 mg | ORAL_TABLET | ORAL | Status: DC
  Filled 2016-04-14: qty 5

## 2016-04-14 MED ORDER — PROCHLORPERAZINE MALEATE 5 MG PO TABS: 10.0000 mg | ORAL_TABLET | Freq: Four times a day (QID) | ORAL | Status: DC | PRN

## 2016-04-14 NOTE — H&P (Signed)
History and Physical    Carol Bradley PPI:[id number] DOB: 22-Oct-1940 DOA: 04/14/2016  PCP: [name], MD  Patient coming from: home  Chief Complaint: generalized weakness  HPI: Carol Bradley is a 75 y.o. female with medical history significant of multiple myeloma, in remission, currently on Revlimid. Patient had presented to her oncology appointment yesterday with complaints of generalized weakness and fatigue that has been present for the last 2-3 weeks. This is associated with dyspnea on exertion, dizziness on standing. She's not had any hematuria, hematochezia or melena. She's not had any fever or cough. No dysuria. She reports being treated for a pneumonia 3 weeks ago and completed a course of antibiotics. She's not had any chest pain. Yesterday her hemoglobin was checked and was noted to be 8.4 which declined from 10.4 last month. It was felt that her weakness and dyspnea on exertion was related to symptomatic anemia. Plans were for transfusion of PRBCs, but unfortunately this could not be done in the clinic yesterday. Dr. Whitney Muse requested admission today for transfusion of PRBCs.   Review of Systems: As per HPI otherwise 10 point review of systems negative.    Past Medical History:  Diagnosis Date  . CAD (coronary artery disease)   . COPD (chronic obstructive pulmonary disease) ([place])   . History of tobacco abuse   . Hyperlipidemia   . Hypertension   . 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 Multiple myeloma [facility])     Past Surgical History:  Procedure Laterality Date  . BACK SURGERY    . CARDIAC CATHETERIZATION  04/2011   right and left cath showing normal right heart pressures,but newly diagnosed coronary artery disease/drug eluting stent placed to RCA with residual disease in the proximal RCA and LAD and ramus, normal LV function and 60-65% EF  . COLONOSCOPY N/A 09/30/2014   Procedure: COLONOSCOPY;  Surgeon: Rogene Houston, MD;  Location: [facility];  Service: Endoscopy;   Laterality: N/A;  225  . CORONARY ANGIOPLASTY WITH STENT PLACEMENT  04/2011   mid RCA: 3.0 X38 mm Promus DES. Residual 40% disease proximally  . NECK SURGERY       reports that she quit smoking about 21 years ago. Her smoking use included Cigarettes. She has a 75.00 pack-year smoking history. She has never used smokeless tobacco. She reports that she does not drink alcohol. Her drug history is not on file.  Allergies  Allergen Reactions  . Bortezomib Other (See Comments)    Incontinence of stool with rectal sphincter dysfunction  . Lisinopril Cough  . Plavix [Clopidogrel Bisulfate] Swelling    Family History  Problem Relation Age of Onset  . Heart failure Mother   . Cancer Father     Prior to Admission medications   Medication Sig Start Date End Date Taking? Authorizing Provider  acyclovir (ZOVIRAX) 200 MG capsule Take 1 capsule (200 mg total) by mouth 2 (two) times daily. 01/21/16   Baird Cancer, PA-C  aspirin EC 81 MG tablet Take 81 mg by mouth daily.      Historical Provider, MD  atorvastatin (LIPITOR) 10 MG tablet Take 1 tablet (10 mg total) by mouth daily. 04/12/16   Peter M Martinique, MD  Calcium Carb-Cholecalciferol (CALCIUM 600+D3 PO) Take 1 tablet by mouth 3 (three) times daily.    Historical Provider, MD  dexamethasone (DECADRON) 4 MG tablet Take 10 tablets (40 mg total) by mouth every 7 (seven) days. Patient taking differently: Take 20 mg by mouth 2 (two) times  a week. 5 tablets on Mondays and Fridays 01/12/16   Baird Cancer, PA-C  diazepam (VALIUM) 5 MG tablet Take 5 mg by mouth every 6 (six) hours as needed for anxiety.    Historical Provider, MD  diltiazem (CARDIZEM CD) 240 MG 24 hr capsule TAKE ONE CAPSULE BY MOUTH DAILY 06/21/15   Peter M Martinique, MD  fluticasone [facility]) 50 MCG/ACT nasal spray Place 1 spray into the nose as needed.    Historical Provider, MD  furosemide (LASIX) 20 MG tablet Take 20 mg by mouth daily as needed. For 7 days PRN swelling    Historical  Provider, MD  lenalidomide (REVLIMID) 5 MG capsule Take 1 capsule (5 mg total) by mouth daily. 04/13/16   Patrici Ranks, MD  losartan (COZAAR) 100 MG tablet Take 1 tablet (100 mg total) by mouth daily. 04/12/16   Peter M Martinique, MD  magnesium oxide (MAG-OX) 400 MG tablet Take 400 mg by mouth daily. When taking lasix    Historical Provider, MD  metoprolol tartrate (LOPRESSOR) 25 MG tablet TAKE ONE TABLET BY MOUTH TWICE DAILY 03/27/16   Peter M Martinique, MD  potassium chloride (K-DUR,KLOR-CON) 10 MEQ tablet Take 10 mEq by mouth 3 (three) times daily. When taking lasix    Historical Provider, MD  prochlorperazine (COMPAZINE) 10 MG tablet Take 10 mg by mouth every 6 (six) hours as needed for nausea or vomiting.    Historical Provider, MD    Physical Exam: Vitals:   04/14/16 0945 04/14/16 1045  BP: (!) 139/49 (!) 128/48  Pulse: 74 72  Resp: 20 18  Temp: 97.9 F (36.6 C) 98 F (36.7 C)  TempSrc: Oral Oral  SpO2: 100% 99%      Constitutional: NAD, calm, comfortable Vitals:   04/14/16 0945 04/14/16 1045  BP: (!) 139/49 (!) 128/48  Pulse: 74 72  Resp: 20 18  Temp: 97.9 F (36.6 C) 98 F (36.7 C)  TempSrc: Oral Oral  SpO2: 100% 99%   Eyes: PERRL, lids and conjunctivae normal ENMT: Mucous membranes are moist. Posterior pharynx clear of any exudate or lesions.Normal dentition.  Neck: normal, supple, no masses, no thyromegaly Respiratory: coarse crackles at bases. Normal respiratory effort. No accessory muscle use.  Cardiovascular: Regular rate and rhythm, no murmurs / rubs / gallops. trace extremity edema. 2+ pedal pulses. No carotid bruits.  Abdomen: no tenderness, no masses palpated. No hepatosplenomegaly. Bowel sounds positive.  Musculoskeletal: no clubbing / cyanosis. No joint deformity upper and lower extremities. Good ROM, no contractures. Normal muscle tone.  Skin: no rashes, lesions, ulcers. No induration Neurologic: CN 2-12 grossly intact. Sensation intact, DTR normal.  Strength 5/5 in all 4.  Psychiatric: Normal judgment and insight. Alert and oriented x 3. Normal mood.    Labs on Admission: I have personally reviewed following labs and imaging studies  CBC:  Recent Labs Lab 04/13/16 0850  WBC 9.1  NEUTROABS 6.5  HGB 8.4*  HCT 26.0*  MCV 100.0  PLT 166   Basic Metabolic Panel:  Recent Labs Lab 04/13/16 0850  NA 135  K 3.9  CL 104  CO2 25  GLUCOSE 116*  BUN 33*  CREATININE 1.20*  CALCIUM 8.1*   GFR: Estimated Creatinine Clearance: 39.4 mL/min (by C-G formula based on SCr of 1.2 mg/dL (H)). Liver Function Tests:  Recent Labs Lab 04/13/16 0850  AST 21  ALT 18  ALKPHOS 61  BILITOT 0.6  PROT 5.8*  ALBUMIN 2.8*   No results for input(s): LIPASE, AMYLASE  in the last 168 hours. No results for input(s): AMMONIA in the last 168 hours. Coagulation Profile: No results for input(s): INR, PROTIME in the last 168 hours. Cardiac Enzymes: No results for input(s): CKTOTAL, CKMB, CKMBINDEX, TROPONINI in the last 168 hours. BNP (last 3 results) No results for input(s): PROBNP in the last 8760 hours. HbA1C: No results for input(s): HGBA1C in the last 72 hours. CBG: No results for input(s): GLUCAP in the last 168 hours. Lipid Profile: No results for input(s): CHOL, HDL, LDLCALC, TRIG, CHOLHDL, LDLDIRECT in the last 72 hours. Thyroid Function Tests: No results for input(s): TSH, T4TOTAL, FREET4, T3FREE, THYROIDAB in the last 72 hours. Anemia Panel:  Recent Labs  04/13/16 1039  VITAMINB12 >7,500*  FOLATE 10.8  FERRITIN 122   Urine analysis:    Component Value Date/Time   COLORURINE YELLOW 01/29/2016 0910   APPEARANCEUR CLOUDY (A) 01/29/2016 0910   LABSPEC 1.020 01/29/2016 0910   PHURINE 6.5 01/29/2016 0910   GLUCOSEU NEGATIVE 01/29/2016 0910   HGBUR MODERATE (A) 01/29/2016 0910   BILIRUBINUR NEGATIVE 01/29/2016 0910   KETONESUR NEGATIVE 01/29/2016 0910   PROTEINUR 100 (A) 01/29/2016 0910   NITRITE NEGATIVE 01/29/2016 0910     LEUKOCYTESUR LARGE (A) 01/29/2016 0910   Sepsis Labs: !!!!!!!!!!!!!!!!!!!!!!!!!!!!!!!!!!!!!!!!!!!! _0 (procalcitonin:4,lacticidven:4) )No results found for this or any previous visit (from the past 240 hour(s)).   Radiological Exams on Admission: No results found.   Assessment/Plan Active Problems:   Hypertension   Hyperlipidemia   Multiple myeloma (HCC)   COPD (chronic obstructive pulmonary disease) (HCC)   Symptomatic anemia   1. Symptomatic anemia. Felt to be related to chemotherapy versus chronic disease. 2 units of PRBCs have been ordered per oncology. Anticipate after transfusion she should likely be discharged home.  2. Multiple myeloma, and remission. Currently on Revlimid. Recently had a dose reduction. Follow-up with oncology  3. COPD . no evidence of wheezing. Appears to be at baseline.  4. Hypertension. Continue losartan  5. Hyperlipidemia. Continue statin   DVT prophylaxis: early ambulation Code Status: full code Family Communication: discussed with family at the bedside Disposition Plan: discharge home after transfusion Consults called:  Admission status: observation   Kazimierz [place] MD [facility] Pager [phone]  If 7PM-7AM, please contact night-coverage [email or web address] Password [email or web address]  04/14/2016, 11:06 AM

## 2016-04-14 NOTE — Discharge Summary (Signed)
Physician Discharge Summary  Carol Bradley KTG:[id number] DOB: [birth year] DOA: 04/14/2016  PCP: [name], MD  Admit date: 04/14/2016 Discharge date: 04/14/2016  Admitted From: home Disposition:  home  Recommendations for Outpatient Follow-up:  1. Follow up with PCP in 1-2 weeks 2. Please obtain BMP/CBC in one week   Home Health: Equipment/Devices:  Discharge Condition:stable CODE STATUS:full Diet recommendation: Heart Healthy  Brief/Interim Summary: Carol Bradley is a 75 y.o. female with medical history significant of multiple myeloma, in remission, currently on Revlimid. Patient had presented to her oncology appointment yesterday with complaints of generalized weakness and fatigue that has been present for the last 2-3 weeks. This is associated with dyspnea on exertion, dizziness on standing. She's not had any hematuria, hematochezia or melena. She's not had any fever or cough. No dysuria. She reports being treated for a pneumonia 3 weeks ago and completed a course of antibiotics. She's not had any chest pain. Yesterday her hemoglobin was checked and was noted to be 8.4 which declined from 10.4 last month. It was felt that her weakness and dyspnea on exertion was related to symptomatic anemia. Plans were for transfusion of PRBCs, but unfortunately this could not be done in the clinic yesterday. Dr. Whitney Muse requested admission today for transfusion of PRBCs.Patient underwent transfusion of 2 units of PRBCs without any complications. She felt better after receiving  transfusions. The remainder of her medical issues were stable and therefore she was discharged home. She should follow-up with hematology as previously scheduled. She should have a repeat CBC in 1 week.  Discharge Diagnoses:  Active Problems:   Hypertension   Hyperlipidemia   Multiple myeloma (HCC)   COPD (chronic obstructive pulmonary disease) (HCC)   Symptomatic anemia    Discharge Instructions  Discharge Instructions     Diet - low sodium heart healthy    Complete by:  As directed    Increase activity slowly    Complete by:  As directed        Medication List    TAKE these medications   acyclovir 200 MG capsule Commonly known as:  ZOVIRAX Take 1 capsule (200 mg total) by mouth 2 (two) times daily.   albuterol 108 (90 Base) MCG/ACT inhaler Commonly known as:  PROVENTIL HFA;VENTOLIN HFA Inhale 1-2 puffs into the lungs every 6 (six) hours as needed for wheezing or shortness of breath.   aspirin 325 MG tablet Take 325 mg by mouth daily.   atorvastatin 10 MG tablet Commonly known as:  LIPITOR Take 1 tablet (10 mg total) by mouth daily.   CALCIUM 600+D3 PO Take 1 tablet by mouth 3 (three) times daily.   dexamethasone 4 MG tablet Commonly known as:  DECADRON Take 10 tablets (40 mg total) by mouth every 7 (seven) days. What changed:  how much to take  when to take this  additional instructions   diazepam 5 MG tablet Commonly known as:  VALIUM Take 5 mg by mouth every 6 (six) hours as needed for anxiety.   diltiazem 240 MG 24 hr capsule Commonly known as:  CARDIZEM CD TAKE ONE CAPSULE BY MOUTH DAILY   fluticasone 50 MCG/ACT nasal spray Commonly known as:  FLONASE Place 1 spray into the nose as needed.   furosemide 20 MG tablet Commonly known as:  LASIX Take 20 mg by mouth daily as needed for fluid or edema. For 7 days PRN swelling   lenalidomide 5 MG capsule Commonly known as:  REVLIMID Take 1 capsule (5  mg total) by mouth daily. What changed:  how much to take   losartan 100 MG tablet Commonly known as:  COZAAR Take 1 tablet (100 mg total) by mouth daily. What changed:  when to take this   magnesium oxide 400 MG tablet Commonly known as:  MAG-OX Take 400 mg by mouth 2 (two) times daily. When taking lasix   metoprolol tartrate 25 MG tablet Commonly known as:  LOPRESSOR TAKE ONE TABLET BY MOUTH TWICE DAILY   potassium chloride 10 MEQ tablet Commonly known as:   K-DUR,KLOR-CON Take 10 mEq by mouth 3 (three) times daily. When taking lasix   prochlorperazine 10 MG tablet Commonly known as:  COMPAZINE Take 10 mg by mouth every 6 (six) hours as needed for nausea or vomiting.      Follow-up Information    Molli Hazard, MD .   Specialties:  Hematology and Oncology, Oncology Why:  as scheduled Contact information: [place] [address] [phone]          Allergies  Allergen Reactions  . Bortezomib Other (See Comments)    Incontinence of stool with rectal sphincter dysfunction  . Lisinopril Cough  . Plavix [Clopidogrel Bisulfate] Swelling    Consultations:     Procedures/Studies: Dg Chest 2 View  Result Date: 03/27/2016 CLINICAL DATA:  Shortness of breath. EXAM: CHEST  2 VIEW COMPARISON:  12/03/2015. FINDINGS: Mediastinum and hilar structures are normal. Mild lingular atelectasis and or infiltrate. Interim clearing of right upper lobe infiltrate. Cardiomegaly with normal pulmonary vascularity. Prior cervical spine fusion . IMPRESSION: Mild lingular atelectasis and/or infiltrate. Interim clearing of right upper lobe infiltrate. Electronically Signed   By: Marcello Moores  Register   On: 03/27/2016 16:45       Subjective: Feeling better after transfusions  Discharge Exam: Vitals:   04/14/16 1418 04/14/16 1439  BP: (!) 119/42 (!) 138/54  Pulse: 88 87  Resp: 18 18  Temp: 98.4 F (36.9 C) 98.5 F (36.9 C)   Vitals:   04/14/16 1111 04/14/16 1359 04/14/16 1418 04/14/16 1439  BP: (!) 121/50 (!) 121/46 (!) 119/42 (!) 138/54  Pulse: 72 85 88 87  Resp: _0 Temp: 98.2 F (36.8 C) 98.4 F (36.9 C) 98.4 F (36.9 C) 98.5 F (36.9 C)  TempSrc: Oral Oral Oral Oral  SpO2: 97% 97% 96% 97%    General: Pt is alert, awake, not in acute distress Cardiovascular: RRR, S1/S2 +, no rubs, no gallops Respiratory: CTA bilaterally, no wheezing, no rhonchi Abdominal: Soft, NT, ND, bowel sounds + Extremities: no  edema, no cyanosis    The results of significant diagnostics from this hospitalization (including imaging, microbiology, ancillary and laboratory) are listed below for reference.     Microbiology: No results found for this or any previous visit (from the past 240 hour(s)).   Labs: BNP (last 3 results)  Recent Labs  03/27/16 1617  BNP 326.7*   Basic Metabolic Panel:  Recent Labs Lab 04/13/16 0850  NA 135  K 3.9  CL 104  CO2 25  GLUCOSE 116*  BUN 33*  CREATININE 1.20*  CALCIUM 8.1*   Liver Function Tests:  Recent Labs Lab 04/13/16 0850  AST 21  ALT 18  ALKPHOS 61  BILITOT 0.6  PROT 5.8*  ALBUMIN 2.8*   No results for input(s): LIPASE, AMYLASE in the last 168 hours. No results for input(s): AMMONIA in the last 168 hours. CBC:  Recent Labs Lab 04/13/16 0850  WBC 9.1  NEUTROABS 6.5  HGB 8.4*  HCT 26.0*  MCV 100.0  PLT 225   Cardiac Enzymes: No results for input(s): CKTOTAL, CKMB, CKMBINDEX, TROPONINI in the last 168 hours. BNP: Invalid input(s): POCBNP CBG: No results for input(s): GLUCAP in the last 168 hours. D-Dimer No results for input(s): DDIMER in the last 72 hours. Hgb A1c No results for input(s): HGBA1C in the last 72 hours. Lipid Profile No results for input(s): CHOL, HDL, LDLCALC, TRIG, CHOLHDL, LDLDIRECT in the last 72 hours. Thyroid function studies No results for input(s): TSH, T4TOTAL, T3FREE, THYROIDAB in the last 72 hours.  Invalid input(s): FREET3 Anemia work up  Recent Labs  04/13/16 1039  VITAMINB12 >7,500*  FOLATE 10.8  FERRITIN 122   Urinalysis    Component Value Date/Time   COLORURINE YELLOW 01/29/2016 0910   APPEARANCEUR CLOUDY (A) 01/29/2016 0910   LABSPEC 1.020 01/29/2016 0910   PHURINE 6.5 01/29/2016 0910   GLUCOSEU NEGATIVE 01/29/2016 0910   HGBUR MODERATE (A) 01/29/2016 0910   BILIRUBINUR NEGATIVE 01/29/2016 0910   KETONESUR NEGATIVE 01/29/2016 0910   PROTEINUR 100 (A) 01/29/2016 0910   NITRITE  NEGATIVE 01/29/2016 0910   LEUKOCYTESUR LARGE (A) 01/29/2016 0910   Sepsis Labs Invalid input(s): PROCALCITONIN,  WBC,  LACTICIDVEN Microbiology No results found for this or any previous visit (from the past 240 hour(s)).   Time coordinating discharge: Over 30 minutes  SIGNED:   Kathie Dike, MD  [facility] 04/14/2016, 6:05 PM Pager   If 7PM-7AM, please contact night-coverage [email or web address] Password [email or web address]

## 2016-04-14 NOTE — Progress Notes (Signed)
Patient discharged with instructions, prescription, and care notes.  Verbalized understanding via teach back.  IV was removed and the site was WNL. Patient voiced no further complaints or concerns at the time of discharge.  Appointments scheduled per instructions.  Patient left the floor via w/c family  And staff in stable condition. 

## 2016-04-15 DIAGNOSIS — Z7189 Other specified counseling: Secondary | ICD-10-CM | POA: Insufficient documentation

## 2016-04-15 DIAGNOSIS — E538 Deficiency of other specified B group vitamins: Secondary | ICD-10-CM

## 2016-04-15 HISTORY — DX: Deficiency of other specified B group vitamins: E53.8

## 2016-04-15 LAB — TYPE AND SCREEN
ABO/RH(D): O POS
ANTIBODY SCREEN: NEGATIVE
UNIT DIVISION: 0
UNIT DIVISION: 0

## 2016-04-15 NOTE — Assessment & Plan Note (Signed)
Continue with B12 replacement monthly

## 2016-04-15 NOTE — Assessment & Plan Note (Signed)
Hypogammamglobulinemia, on IVIG monthly.  Labs reviewed: IgG, IgA, IgM.  I personally reviewed and went over laboratory results with the patient.  The results are noted within this dictation.  Based upon her IgG level, will increase IVIG dose.  Supportive therapy plan is reviewed and updated accordingly.  Labs in 4 weeks: IgG, IgA, IgM.

## 2016-04-15 NOTE — Assessment & Plan Note (Signed)
These will need to be explicitly addressed at follow-up with the patient and family.

## 2016-04-15 NOTE — Assessment & Plan Note (Signed)
IgG kappa Myeloma, on Revlimid/Dexamethasone and taking ASA 325 mg daily.  Labs today: CBC diff, CMET, myeloma panel.  I personally reviewed and went over laboratory results with the patient.  The results are noted within this dictation.  Last set of myeloma studies reviewed with the patient and her son, remission noted.  Labs in 4 weeks: CBC diff, CMET, ESR, CRP, SPEP+IFE, IgG, IgA, IgM, light chain assay, B2M  Zometa given today. Corrected calcium WNL. Have reduced zometa for creatinine clearance.   Return in 4 weeks for follow-up.

## 2016-04-15 NOTE — Assessment & Plan Note (Addendum)
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 I do not feel the patient, given her extreme fatigue, ongoing transfusion requirements and evidence of remission needs to continue on current revlimid dosing. Both the patient and her son note poor quality.    Will have her finish current cycle of revlimid QOD. Have written for revlimid "maintenance" 5 mg daily. Will begin to decrease dexamethasone to 20 mg weekly and ultimately discontinue. Labs will be monitored closely as well as PS  May consider renal dosing aranesp in future if needed

## 2016-04-18 LAB — IMMUNOFIXATION ELECTROPHORESIS
IGA: 116 mg/dL (ref 64–422)
IGG (IMMUNOGLOBIN G), SERUM: 584 mg/dL — AB (ref 700–1600)
IGM, SERUM: 97 mg/dL (ref 26–217)
TOTAL PROTEIN ELP: 5.5 g/dL — AB (ref 6.0–8.5)

## 2016-04-20 ENCOUNTER — Telehealth (HOSPITAL_COMMUNITY): Payer: Self-pay | Admitting: Emergency Medicine

## 2016-04-20 NOTE — Telephone Encounter (Signed)
Called and stated that arm was black where she had IV from blood transfusion.  That could be normal bruising from the IV, sometimes it does that.  Stated it had never done that before as many times as she had been getting blood.  I told her that every time is different.  She then changed her story and said that her whole arm from the shoulder to wrist was black.  So I told her that if she was concerned that it was a blood clot to got to the ER to be checked out and I spoke with Dr Whitney Muse and she was in agreement.

## 2016-05-01 ENCOUNTER — Other Ambulatory Visit (HOSPITAL_COMMUNITY): Payer: Self-pay

## 2016-05-01 DIAGNOSIS — C9001 Multiple myeloma in remission: Secondary | ICD-10-CM

## 2016-05-01 MED ORDER — MAGIC MOUTHWASH: ORAL | 1 refills | Status: DC

## 2016-05-02 ENCOUNTER — Other Ambulatory Visit (HOSPITAL_COMMUNITY): Payer: Self-pay | Admitting: Emergency Medicine

## 2016-05-02 MED ORDER — MAGNESIUM OXIDE 400 MG PO TABS: 400.0000 mg | ORAL_TABLET | Freq: Two times a day (BID) | ORAL | 2 refills | Status: DC

## 2016-05-02 NOTE — Progress Notes (Signed)
Magnesium refilled

## 2016-05-05 ENCOUNTER — Telehealth (HOSPITAL_COMMUNITY): Payer: Self-pay | Admitting: Hematology & Oncology

## 2016-05-05 NOTE — Telephone Encounter (Signed)
Called pt to get financial info and household size for [employer] enrollment form

## 2016-05-08 ENCOUNTER — Telehealth (HOSPITAL_COMMUNITY): Payer: Self-pay | Admitting: Hematology & Oncology

## 2016-05-08 ENCOUNTER — Other Ambulatory Visit (HOSPITAL_COMMUNITY): Payer: Self-pay

## 2016-05-08 DIAGNOSIS — C9001 Multiple myeloma in remission: Secondary | ICD-10-CM

## 2016-05-08 MED ORDER — POTASSIUM CHLORIDE CRYS ER 10 MEQ PO TBCR: 10.0000 meq | EXTENDED_RELEASE_TABLET | Freq: Three times a day (TID) | ORAL | 2 refills | Status: DC

## 2016-05-08 NOTE — Telephone Encounter (Signed)
Pt assistance pending by [employer]. Apparently pt is in her doughnut hole and can not afford her copay or her previous funding has expired.

## 2016-05-08 NOTE — Telephone Encounter (Signed)
Received refill request from patients pharmacy for Potassium. Chart checked and refilled.

## 2016-05-09 ENCOUNTER — Telehealth (HOSPITAL_COMMUNITY): Payer: Self-pay | Admitting: Hematology & Oncology

## 2016-05-09 ENCOUNTER — Other Ambulatory Visit (HOSPITAL_COMMUNITY): Payer: Self-pay | Admitting: Oncology

## 2016-05-09 DIAGNOSIS — C9000 Multiple myeloma not having achieved remission: Secondary | ICD-10-CM

## 2016-05-09 DIAGNOSIS — D649 Anemia, unspecified: Secondary | ICD-10-CM

## 2016-05-09 DIAGNOSIS — D801 Nonfamilial hypogammaglobulinemia: Secondary | ICD-10-CM

## 2016-05-09 MED ORDER — LENALIDOMIDE 5 MG PO CAPS: 5.0000 mg | ORAL_CAPSULE | Freq: Every day | ORAL | 1 refills | Status: DC

## 2016-05-09 NOTE — Telephone Encounter (Signed)
PT RCVD ASSIST FROM [employer] PAP PROGRAM EXPIRES 07/09/16.

## 2016-05-10 ENCOUNTER — Telehealth (HOSPITAL_COMMUNITY): Payer: Self-pay | Admitting: Hematology & Oncology

## 2016-05-10 NOTE — Telephone Encounter (Signed)
[employer]/PAP SP RX [phone] F [phone] P  AWARDED FREE REVLIMID UNTIL 07/09/16 AND THEN MUST REAPPLY.

## 2016-05-11 ENCOUNTER — Ambulatory Visit (HOSPITAL_COMMUNITY): Payer: Medicare Other | Admitting: Hematology & Oncology

## 2016-05-11 ENCOUNTER — Encounter (HOSPITAL_COMMUNITY): Payer: Medicare Other | Attending: Oncology

## 2016-05-11 ENCOUNTER — Encounter (HOSPITAL_COMMUNITY): Payer: Medicare Other

## 2016-05-11 VITALS — BP 106/43 | HR 81 | Temp 97.9°F | Resp 18 | Wt 147.8 lb

## 2016-05-11 DIAGNOSIS — C9001 Multiple myeloma in remission: Secondary | ICD-10-CM | POA: Diagnosis present

## 2016-05-11 DIAGNOSIS — D649 Anemia, unspecified: Secondary | ICD-10-CM

## 2016-05-11 DIAGNOSIS — E538 Deficiency of other specified B group vitamins: Secondary | ICD-10-CM | POA: Diagnosis not present

## 2016-05-11 DIAGNOSIS — D801 Nonfamilial hypogammaglobulinemia: Secondary | ICD-10-CM

## 2016-05-11 DIAGNOSIS — C9000 Multiple myeloma not having achieved remission: Secondary | ICD-10-CM | POA: Diagnosis not present

## 2016-05-11 LAB — COMPREHENSIVE METABOLIC PANEL
ALBUMIN: 2.6 g/dL — AB (ref 3.5–5.0)
ALK PHOS: 72 U/L (ref 38–126)
ALT: 14 U/L (ref 14–54)
ANION GAP: 7 (ref 5–15)
AST: 23 U/L (ref 15–41)
BILIRUBIN TOTAL: 0.5 mg/dL (ref 0.3–1.2)
BUN: 21 mg/dL — AB (ref 6–20)
CALCIUM: 7.9 mg/dL — AB (ref 8.9–10.3)
CO2: 22 mmol/L (ref 22–32)
CREATININE: 1.38 mg/dL — AB (ref 0.44–1.00)
Chloride: 109 mmol/L (ref 101–111)
GFR calc Af Amer: 42 mL/min — ABNORMAL LOW (ref >60)
GFR calc non Af Amer: 36 mL/min — ABNORMAL LOW (ref >60)
GLUCOSE: 97 mg/dL (ref 65–99)
Potassium: 4.3 mmol/L (ref 3.5–5.1)
Sodium: 138 mmol/L (ref 135–145)
TOTAL PROTEIN: 6.5 g/dL (ref 6.5–8.1)

## 2016-05-11 LAB — SAMPLE TO BLOOD BANK

## 2016-05-11 LAB — CBC WITH DIFFERENTIAL/PLATELET
BASOS PCT: 0 %
Basophils Absolute: 0 10*3/uL (ref 0.0–0.1)
EOS ABS: 0.1 10*3/uL (ref 0.0–0.7)
EOS PCT: 1 %
HEMATOCRIT: 27.5 % — AB (ref 36.0–46.0)
HEMOGLOBIN: 8.9 g/dL — AB (ref 12.0–15.0)
LYMPHS PCT: 10 %
Lymphs Abs: 0.8 10*3/uL (ref 0.7–4.0)
MCH: 32.2 pg (ref 26.0–34.0)
MCHC: 32.4 g/dL (ref 30.0–36.0)
MCV: 99.6 fL (ref 78.0–100.0)
MONOS PCT: 22 %
Monocytes Absolute: 1.7 10*3/uL — ABNORMAL HIGH (ref 0.1–1.0)
NEUTROS ABS: 5.3 10*3/uL (ref 1.7–7.7)
NEUTROS PCT: 67 %
Platelets: 216 10*3/uL (ref 150–400)
RBC: 2.76 MIL/uL — ABNORMAL LOW (ref 3.87–5.11)
RDW: 17.7 % — ABNORMAL HIGH (ref 11.5–15.5)
WBC MORPHOLOGY: INCREASED
WBC: 7.9 10*3/uL (ref 4.0–10.5)

## 2016-05-11 LAB — PREPARE RBC (CROSSMATCH)

## 2016-05-11 MED ORDER — ACETAMINOPHEN 325 MG PO TABS
650.0000 mg | ORAL_TABLET | Freq: Four times a day (QID) | ORAL | Status: DC | PRN
  Administered 2016-05-11: 650 mg via ORAL
  Filled 2016-05-11: qty 2

## 2016-05-11 MED ORDER — IMMUNE GLOBULIN (HUMAN) 10 GM/100ML IV SOLN
70.0000 g | Freq: Once | INTRAVENOUS | Status: AC
  Administered 2016-05-11: 70 g via INTRAVENOUS
  Filled 2016-05-11: qty 600

## 2016-05-11 MED ORDER — CYANOCOBALAMIN 1000 MCG/ML IJ SOLN
1000.0000 ug | Freq: Once | INTRAMUSCULAR | Status: AC
  Administered 2016-05-11: 1000 ug via INTRAMUSCULAR
  Filled 2016-05-11: qty 1

## 2016-05-11 MED ORDER — DIPHENHYDRAMINE HCL 25 MG PO CAPS
25.0000 mg | ORAL_CAPSULE | ORAL | Status: AC | PRN
  Administered 2016-05-11: 25 mg via ORAL
  Filled 2016-05-11: qty 1

## 2016-05-11 MED ORDER — ZOLEDRONIC ACID 4 MG/5ML IV CONC
3.3000 mg | Freq: Once | INTRAVENOUS | Status: AC
  Administered 2016-05-11: 3.3 mg via INTRAVENOUS
  Filled 2016-05-11: qty 4.13

## 2016-05-11 MED ORDER — DEXTROSE 5 % IV SOLN
INTRAVENOUS | Status: DC
  Administered 2016-05-11: 10:00:00 via INTRAVENOUS

## 2016-05-11 MED ORDER — SODIUM CHLORIDE 0.9% FLUSH
10.0000 mL | INTRAVENOUS | Status: DC | PRN
  Administered 2016-05-11: 10 mL via INTRAVENOUS
  Filled 2016-05-11: qty 10

## 2016-05-11 NOTE — Progress Notes (Signed)
Corrected Calcium 9.02mg /dl per Dr. Whitney Muse .Pt OK for Zometa today                                                                        Pauline J Miskell tolerated IVIG,Zometa and Vit B12 injection well without complaints or incident. Labs reviewed with Dr. Whitney Muse and orders given for pt to hold Revlimid until her next MD appt, continue to take her Calcium BID and pt to receive 1 unit of blood tomorrow for HGB 8.9. Pt verbalized understanding of these instructions and will return to clinic tomorrow. Pt discharged via wheelchair in stable condition accompanied by friend

## 2016-05-11 NOTE — Patient Instructions (Addendum)
[place] at [facility] Discharge Instructions  RECOMMENDATIONS MADE BY THE CONSULTANT AND ANY TEST RESULTS WILL BE SENT TO YOUR REFERRING PHYSICIAN.  Received IVIG,Zometa and Vit B12 injection today. Follow-up as scheduled. Call clinic for any questions or concerns  Thank you for choosing [place] at [facility] to provide your oncology and hematology care.  To afford each patient quality time with our provider, please arrive at least 15 minutes before your scheduled appointment time.   Beginning January 23rd 2017 lab work for the [name] will be done in the  Main lab at [facility] on 1st floor. If you have a lab appointment with the [facility] please come in thru the  Main Entrance and check in at the main information desk  You need to re-schedule your appointment should you arrive 10 or more minutes late.  We strive to give you quality time with our providers, and arriving late affects you and other patients whose appointments are after yours.  Also, if you no show three or more times for appointments you may be dismissed from the clinic at the providers discretion.     Again, thank you for choosing [facility].  Our hope is that these requests will decrease the amount of time that you wait before being seen by our physicians.       _____________________________________________________________  Should you have questions after your visit to [facility], please contact our office at [phone] between the hours of 8:30 a.m. and 4:30 p.m.  Voicemails left after 4:30 p.m. will not be returned until the following business day.  For prescription refill requests, have your pharmacy contact our office.         Resources For Cancer Patients and their Caregivers ? American Cancer Society: Can assist with transportation, wigs, general needs, runs Look Good Feel Better.        ([phone] ? Cancer  Care: Provides financial assistance, online support groups, medication/co-pay assistance.  1-800-813-HOPE 367-177-0120) ? [place] Assists [place] cancer patients and their families through emotional , educational and financial support.  702-112-1629 ? [place] DSS Where to apply for food stamps, Medicaid and utility assistance. 605-612-4484 ? RCATS: Transportation to medical appointments. (313) 197-8210 ? Social Security Administration: May apply for disability if have a Stage IV cancer. 347 726 0041 [phone]) 616-1472 ? [employer], Disability and Transit Services: Assists with nutrition, care and transit needs. [place] Support Programs: @10RELATIVEDAYS @ > Cancer Support Group  2nd Tuesday of the month 1pm-2pm, Journey Room  > Creative Journey  3rd Tuesday of the month 1130am-1pm, Journey Room  > Look Good Feel Better  1st Wednesday of the month 10am-12 noon, Journey Room (Call [place] to register [phone])

## 2016-05-12 ENCOUNTER — Encounter (HOSPITAL_BASED_OUTPATIENT_CLINIC_OR_DEPARTMENT_OTHER): Payer: Medicare Other

## 2016-05-12 DIAGNOSIS — D649 Anemia, unspecified: Secondary | ICD-10-CM | POA: Diagnosis present

## 2016-05-12 DIAGNOSIS — C9001 Multiple myeloma in remission: Secondary | ICD-10-CM | POA: Diagnosis not present

## 2016-05-12 DIAGNOSIS — D801 Nonfamilial hypogammaglobulinemia: Secondary | ICD-10-CM

## 2016-05-12 LAB — KAPPA/LAMBDA LIGHT CHAINS
KAPPA FREE LGHT CHN: 46.1 mg/L — AB (ref 3.3–19.4)
KAPPA, LAMDA LIGHT CHAIN RATIO: 0.69 (ref 0.26–1.65)
LAMDA FREE LIGHT CHAINS: 66.9 mg/L — AB (ref 5.7–26.3)

## 2016-05-12 MED ORDER — DIPHENHYDRAMINE HCL 25 MG PO CAPS
25.0000 mg | ORAL_CAPSULE | Freq: Once | ORAL | Status: AC
  Administered 2016-05-12: 25 mg via ORAL

## 2016-05-12 MED ORDER — ACETAMINOPHEN 325 MG PO TABS
ORAL_TABLET | ORAL | Status: AC
  Filled 2016-05-12: qty 2

## 2016-05-12 MED ORDER — ACETAMINOPHEN 325 MG PO TABS
650.0000 mg | ORAL_TABLET | Freq: Once | ORAL | Status: AC
  Administered 2016-05-12: 650 mg via ORAL

## 2016-05-12 MED ORDER — DIPHENHYDRAMINE HCL 25 MG PO CAPS
ORAL_CAPSULE | ORAL | Status: AC
  Filled 2016-05-12: qty 1

## 2016-05-12 MED ORDER — SODIUM CHLORIDE 0.9 % IV SOLN
250.0000 mL | Freq: Once | INTRAVENOUS | Status: AC
  Administered 2016-05-12: 250 mL via INTRAVENOUS

## 2016-05-12 NOTE — Patient Instructions (Signed)
[place] at [facility] Discharge Instructions  RECOMMENDATIONS MADE BY THE CONSULTANT AND ANY TEST RESULTS WILL BE SENT TO YOUR REFERRING PHYSICIAN.  Received 1 unit of blood today. Follow-up as scheduled. Call clinic for any questions or concerns  Thank you for choosing [place] at [facility] to provide your oncology and hematology care.  To afford each patient quality time with our provider, please arrive at least 15 minutes before your scheduled appointment time.   Beginning January 23rd 2017 lab work for the [name] will be done in the  Main lab at [facility] on 1st floor. If you have a lab appointment with the [facility] please come in thru the  Main Entrance and check in at the main information desk  You need to re-schedule your appointment should you arrive 10 or more minutes late.  We strive to give you quality time with our providers, and arriving late affects you and other patients whose appointments are after yours.  Also, if you no show three or more times for appointments you may be dismissed from the clinic at the providers discretion.     Again, thank you for choosing [facility].  Our hope is that these requests will decrease the amount of time that you wait before being seen by our physicians.       _____________________________________________________________  Should you have questions after your visit to [facility], please contact our office at [phone] between the hours of 8:30 a.m. and 4:30 p.m.  Voicemails left after 4:30 p.m. will not be returned until the following business day.  For prescription refill requests, have your pharmacy contact our office.         Resources For Cancer Patients and their Caregivers ? American Cancer Society: Can assist with transportation, wigs, general needs, runs Look Good Feel Better.        [phone] ? Cancer Care: Provides financial  assistance, online support groups, medication/co-pay assistance.  1-800-813-HOPE 8452092125) ? [place] Assists [place] cancer patients and their families through emotional , educational and financial support.  720-206-0284 ? [place] DSS Where to apply for food stamps, Medicaid and utility assistance. (873)290-8520 ? RCATS: Transportation to medical appointments. 419 540 2426 ? Social Security Administration: May apply for disability if have a Stage IV cancer. 314-825-4882 [phone]) [phone] ? [employer], Disability and Transit Services: Assists with nutrition, care and transit needs. [place] Support Programs: @10RELATIVEDAYS @ > Cancer Support Group  2nd Tuesday of the month 1pm-2pm, Journey Room  > Creative Journey  3rd Tuesday of the month 1130am-1pm, Journey Room  > Look Good Feel Better  1st Wednesday of the month 10am-12 noon, Journey Room (Call [place] to register [phone])

## 2016-05-12 NOTE — Progress Notes (Signed)
Carol Bradley tolerated blood transfusion well without complaints or incident. VSS upon discharge. Pt discharged via wheelchair in satisfactory condition with family member

## 2016-05-13 LAB — TYPE AND SCREEN
ABO/RH(D): O POS
Antibody Screen: NEGATIVE
UNIT DIVISION: 0

## 2016-05-16 LAB — MULTIPLE MYELOMA PANEL, SERUM
ALBUMIN SERPL ELPH-MCNC: 2.6 g/dL — AB (ref 2.9–4.4)
ALPHA 1: 0.4 g/dL (ref 0.0–0.4)
ALPHA2 GLOB SERPL ELPH-MCNC: 1 g/dL (ref 0.4–1.0)
Albumin/Glob SerPl: 0.9 (ref 0.7–1.7)
B-GLOBULIN SERPL ELPH-MCNC: 0.8 g/dL (ref 0.7–1.3)
Gamma Glob SerPl Elph-Mcnc: 1 g/dL (ref 0.4–1.8)
Globulin, Total: 3.2 g/dL (ref 2.2–3.9)
IGG (IMMUNOGLOBIN G), SERUM: 746 mg/dL (ref 700–1600)
IGM, SERUM: 319 mg/dL — AB (ref 26–217)
IgA: 143 mg/dL (ref 64–422)
TOTAL PROTEIN ELP: 5.8 g/dL — AB (ref 6.0–8.5)

## 2016-06-08 ENCOUNTER — Encounter (HOSPITAL_COMMUNITY): Payer: Medicare Other

## 2016-06-08 ENCOUNTER — Encounter (HOSPITAL_COMMUNITY): Payer: Self-pay | Admitting: Hematology & Oncology

## 2016-06-08 ENCOUNTER — Encounter (HOSPITAL_BASED_OUTPATIENT_CLINIC_OR_DEPARTMENT_OTHER): Payer: Medicare Other | Admitting: Hematology & Oncology

## 2016-06-08 ENCOUNTER — Encounter (HOSPITAL_BASED_OUTPATIENT_CLINIC_OR_DEPARTMENT_OTHER): Payer: Medicare Other

## 2016-06-08 VITALS — BP 142/57 | HR 89 | Temp 97.9°F | Resp 18

## 2016-06-08 VITALS — BP 131/50 | HR 75 | Temp 97.6°F | Resp 22 | Wt 154.4 lb

## 2016-06-08 DIAGNOSIS — D801 Nonfamilial hypogammaglobulinemia: Secondary | ICD-10-CM | POA: Diagnosis present

## 2016-06-08 DIAGNOSIS — C9000 Multiple myeloma not having achieved remission: Secondary | ICD-10-CM

## 2016-06-08 DIAGNOSIS — E538 Deficiency of other specified B group vitamins: Secondary | ICD-10-CM | POA: Diagnosis not present

## 2016-06-08 DIAGNOSIS — C9001 Multiple myeloma in remission: Secondary | ICD-10-CM

## 2016-06-08 DIAGNOSIS — J449 Chronic obstructive pulmonary disease, unspecified: Secondary | ICD-10-CM | POA: Diagnosis not present

## 2016-06-08 DIAGNOSIS — D649 Anemia, unspecified: Secondary | ICD-10-CM

## 2016-06-08 LAB — CBC WITH DIFFERENTIAL/PLATELET
Basophils Absolute: 0 10*3/uL (ref 0.0–0.1)
Basophils Relative: 0 %
EOS PCT: 0 %
Eosinophils Absolute: 0 10*3/uL (ref 0.0–0.7)
HCT: 31.9 % — ABNORMAL LOW (ref 36.0–46.0)
Hemoglobin: 10.3 g/dL — ABNORMAL LOW (ref 12.0–15.0)
LYMPHS ABS: 0.8 10*3/uL (ref 0.7–4.0)
LYMPHS PCT: 7 %
MCH: 32.3 pg (ref 26.0–34.0)
MCHC: 32.3 g/dL (ref 30.0–36.0)
MCV: 100 fL (ref 78.0–100.0)
MONO ABS: 1.4 10*3/uL — AB (ref 0.1–1.0)
Monocytes Relative: 12 %
Neutro Abs: 10.1 10*3/uL — ABNORMAL HIGH (ref 1.7–7.7)
Neutrophils Relative %: 81 %
PLATELETS: 148 10*3/uL — AB (ref 150–400)
RBC: 3.19 MIL/uL — ABNORMAL LOW (ref 3.87–5.11)
RDW: 17 % — AB (ref 11.5–15.5)
WBC: 12.3 10*3/uL — ABNORMAL HIGH (ref 4.0–10.5)

## 2016-06-08 LAB — BASIC METABOLIC PANEL
Anion gap: 9 (ref 5–15)
BUN: 32 mg/dL — AB (ref 6–20)
CHLORIDE: 104 mmol/L (ref 101–111)
CO2: 26 mmol/L (ref 22–32)
Calcium: 8.4 mg/dL — ABNORMAL LOW (ref 8.9–10.3)
Creatinine, Ser: 1.26 mg/dL — ABNORMAL HIGH (ref 0.44–1.00)
GFR calc Af Amer: 47 mL/min — ABNORMAL LOW (ref >60)
GFR, EST NON AFRICAN AMERICAN: 41 mL/min — AB (ref >60)
GLUCOSE: 85 mg/dL (ref 65–99)
POTASSIUM: 3.7 mmol/L (ref 3.5–5.1)
Sodium: 139 mmol/L (ref 135–145)

## 2016-06-08 LAB — HEPATIC FUNCTION PANEL
ALBUMIN: 3 g/dL — AB (ref 3.5–5.0)
ALT: 18 U/L (ref 14–54)
AST: 23 U/L (ref 15–41)
Alkaline Phosphatase: 60 U/L (ref 38–126)
BILIRUBIN DIRECT: 0.1 mg/dL (ref 0.1–0.5)
BILIRUBIN TOTAL: 0.5 mg/dL (ref 0.3–1.2)
Indirect Bilirubin: 0.4 mg/dL (ref 0.3–0.9)
Total Protein: 6.3 g/dL — ABNORMAL LOW (ref 6.5–8.1)

## 2016-06-08 LAB — SAMPLE TO BLOOD BANK

## 2016-06-08 MED ORDER — ZOLEDRONIC ACID 4 MG/100ML IV SOLN
4.0000 mg | Freq: Once | INTRAVENOUS | Status: DC
  Filled 2016-06-08: qty 100

## 2016-06-08 MED ORDER — SODIUM CHLORIDE 0.9 % IV SOLN
4.0000 mg | Freq: Once | INTRAVENOUS | Status: DC
Start: 2016-06-08 — End: 2016-06-08

## 2016-06-08 MED ORDER — DIPHENHYDRAMINE HCL 25 MG PO CAPS
25.0000 mg | ORAL_CAPSULE | ORAL | Status: AC | PRN
  Administered 2016-06-08: 25 mg via ORAL
  Filled 2016-06-08: qty 1

## 2016-06-08 MED ORDER — DEXTROSE 5 % IV SOLN
INTRAVENOUS | Status: DC
  Administered 2016-06-08: 09:00:00 via INTRAVENOUS

## 2016-06-08 MED ORDER — ZOLEDRONIC ACID 4 MG/100ML IV SOLN
4.0000 mg | Freq: Once | INTRAVENOUS | Status: AC
  Administered 2016-06-08: 4 mg via INTRAVENOUS
  Filled 2016-06-08: qty 100

## 2016-06-08 MED ORDER — ACETAMINOPHEN 325 MG PO TABS
650.0000 mg | ORAL_TABLET | Freq: Four times a day (QID) | ORAL | Status: DC | PRN
  Administered 2016-06-08: 650 mg via ORAL
  Filled 2016-06-08: qty 2

## 2016-06-08 MED ORDER — ZOLEDRONIC ACID 4 MG/5ML IV CONC: 4.0000 mg | Freq: Once | INTRAVENOUS | Status: DC

## 2016-06-08 MED ORDER — IMMUNE GLOBULIN (HUMAN) 10 GM/100ML IV SOLN
1.0000 g/kg | Freq: Once | INTRAVENOUS | Status: AC
  Administered 2016-06-08: 70 g via INTRAVENOUS
  Filled 2016-06-08: qty 100

## 2016-06-08 MED ORDER — CYANOCOBALAMIN 1000 MCG/ML IJ SOLN
1000.0000 ug | Freq: Once | INTRAMUSCULAR | Status: AC
  Administered 2016-06-08: 1000 ug via INTRAMUSCULAR
  Filled 2016-06-08: qty 1

## 2016-06-08 NOTE — Patient Instructions (Signed)
[place] at [facility] Discharge Instructions  RECOMMENDATIONS MADE BY THE CONSULTANT AND ANY TEST RESULTS WILL BE SENT TO YOUR REFERRING PHYSICIAN.  Received IVIG,Zometa and Vit B12 injection today. Follow-up as scheduled. Call clinic for any questions or concerns  Thank you for choosing [place] at [facility] to provide your oncology and hematology care.  To afford each patient quality time with our provider, please arrive at least 15 minutes before your scheduled appointment time.   Beginning January 23rd 2017 lab work for the [name] will be done in the  Main lab at [facility] on 1st floor. If you have a lab appointment with the [place] please come in thru the  Main Entrance and check in at the main information desk  You need to re-schedule your appointment should you arrive 10 or more minutes late.  We strive to give you quality time with our providers, and arriving late affects you and other patients whose appointments are after yours.  Also, if you no show three or more times for appointments you may be dismissed from the clinic at the providers discretion.     Again, thank you for choosing [facility].  Our hope is that these requests will decrease the amount of time that you wait before being seen by our physicians.       _____________________________________________________________  Should you have questions after your visit to [facility], please contact our office at [phone] between the hours of 8:30 a.m. and 4:30 p.m.  Voicemails left after 4:30 p.m. will not be returned until the following business day.  For prescription refill requests, have your pharmacy contact our office.         Resources For Cancer Patients and their Caregivers ? American Cancer Society: Can assist with transportation, wigs, general needs, runs Look Good Feel Better.        602-663-2075 ? Cancer  Care: Provides financial assistance, online support groups, medication/co-pay assistance.  1-800-813-HOPE 910-199-5684) ? [place] Assists [place] cancer patients and their families through emotional , educational and financial support.  6413824831 ? [place] DSS Where to apply for food stamps, Medicaid and utility assistance. 403-576-9872 ? RCATS: Transportation to medical appointments. 872-388-0974 ? Social Security Administration: May apply for disability if have a Stage IV cancer. 414-659-9415 [phone] ? [employer], Disability and Transit Services: Assists with nutrition, care and transit needs. [place] Support Programs: @10RELATIVEDAYS @ > Cancer Support Group  2nd Tuesday of the month 1pm-2pm, Journey Room  > Creative Journey  3rd Tuesday of the month 1130am-1pm, Journey Room  > Look Good Feel Better  1st Wednesday of the month 10am-12 noon, Journey Room (Call [place] to register [phone])

## 2016-06-08 NOTE — Patient Instructions (Addendum)
[place] at [facility] Discharge Instructions  RECOMMENDATIONS MADE BY THE CONSULTANT AND ANY TEST RESULTS WILL BE SENT TO YOUR REFERRING PHYSICIAN.  You saw Dr.Penland today. Follow up in 1 month with labs Zometa on schedule See Amy at checkout for appointments.  Thank you for choosing [place] at [facility] to provide your oncology and hematology care.  To afford each patient quality time with our provider, please arrive at least 15 minutes before your scheduled appointment time.   Beginning January 23rd 2017 lab work for the [name] will be done in the  Main lab at [facility] on 1st floor. If you have a lab appointment with the [facility] please come in thru the  Main Entrance and check in at the main information desk  You need to re-schedule your appointment should you arrive 10 or more minutes late.  We strive to give you quality time with our providers, and arriving late affects you and other patients whose appointments are after yours.  Also, if you no show three or more times for appointments you may be dismissed from the clinic at the providers discretion.     Again, thank you for choosing [facility].  Our hope is that these requests will decrease the amount of time that you wait before being seen by our physicians.       _____________________________________________________________  Should you have questions after your visit to [facility], please contact our office at [phone] between the hours of 8:30 a.m. and 4:30 p.m.  Voicemails left after 4:30 p.m. will not be returned until the following business day.  For prescription refill requests, have your pharmacy contact our office.         Resources For Cancer Patients and their Caregivers ? American Cancer Society: Can assist with transportation, wigs, general needs, runs Look Good Feel Better.        [phone] ? Cancer  Care: Provides financial assistance, online support groups, medication/co-pay assistance.  1-800-813-HOPE (301) 159-7504) ? [place] Assists [place] cancer patients and their families through emotional , educational and financial support.  (220)384-3166 ? [place] DSS Where to apply for food stamps, Medicaid and utility assistance. 937-417-4702 ? RCATS: Transportation to medical appointments. 313 246 2561 ? Social Security Administration: May apply for disability if have a Stage IV cancer. (360) 231-9913 626-635-9148 ? [employer], Disability and Transit Services: Assists with nutrition, care and transit needs. [place] Support Programs: @10RELATIVEDAYS @ > Cancer Support Group  2nd Tuesday of the month 1pm-2pm, Journey Room  > Creative Journey  3rd Tuesday of the month 1130am-1pm, Journey Room  > Look Good Feel Better  1st Wednesday of the month 10am-12 noon, Journey Room (Call [place] to register [phone])

## 2016-06-08 NOTE — Progress Notes (Signed)
Inez Catalina J Holzheimer tolerated IVIG,Zometa and Vit B12 injection well without complaints or incident. Labs reviewed prior to administration and Corrected Calcium was 9.2 per B. Sonny Masters, Pharmacist. Pt denied any tooth,jaw or leg pain so Zometa was given.Revlimid continued to be held at this time VSS upon discharge. Pt discharged self ambulatory using cane in satisfactory condition accompanied by friend

## 2016-06-08 NOTE — Progress Notes (Signed)
[facility] NOTE  Patient Care Team: [name], MD as PCP - General (Internal Medicine)  CHIEF COMPLAINTS/PURPOSE OF CONSULTATION:  Multiple Myeloma    Multiple myeloma ([place])   02/26/2015 Initial Biopsy    BMBX 50% cellularity, igG kappa myeloma, IgG at 3600 mg/dl, FISH with monosomy of chromosome 13, gain 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       03/18/2015 - 07/28/2015 Chemotherapy    Velcade 1.6 mg/m2 discontinued secondary to intolerance      07/28/2015 Adverse Reaction    stool incontinence, weakness, collapse upon standing, felt to be secondary to velcade      11/09/2015 - 12/13/2015 Chemotherapy    cytoxan IV 300 mg/m2 administered X 2 doses only in addition to rev/dex      11/09/2015 -  Chemotherapy    Revlimid/Dexamethasone        HISTORY OF PRESENTING ILLNESS:  Carol Bradley 75 y.o. female is here for a follow-up of Multiple Myeloma.    Patient is unaccompanied. Myeloma is in remission. She feels much better after stopping Revlimid. She notes that she has had some quality, was able to acutally enjoy Thanksgiving. Walked into the clinic today. Has not needed a transfusion. SOB is better and she is able to do her ADL's. Revlimid dosing was decreased first before discontinuation.   [place] has had a flu shot and pneumonia shot.  Denies mouth sores or headaches.   Patient continues to take dexamethasone and it is causing visual changes.  Presents for IVIG and zometa.   MEDICAL HISTORY:  Past Medical History:  Diagnosis Date  . CAD (coronary artery disease)   . COPD (chronic obstructive pulmonary disease) ([place])   . History of tobacco abuse   . Hyperlipidemia   . Hypertension   . 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 Multiple myeloma ([place])     SURGICAL HISTORY: Past Surgical History:  Procedure Laterality Date  . BACK SURGERY    . CARDIAC CATHETERIZATION  04/2011   right and left cath showing normal right heart  pressures,but newly diagnosed coronary artery disease/drug eluting stent placed to RCA with residual disease in the proximal RCA and LAD and ramus, normal LV function and 60-65% EF  . COLONOSCOPY N/A 09/30/2014   Procedure: COLONOSCOPY;  Surgeon: Rogene Houston, MD;  [facility];  Service: Endoscopy;  Laterality: N/A;  225  . CORONARY ANGIOPLASTY WITH STENT PLACEMENT  04/2011   mid RCA: 3.0 X38 mm Promus DES. Residual 40% disease proximally  . NECK SURGERY      SOCIAL HISTORY: Social History   Social History  . Marital status: Married    Spouse name: N/A  . Number of children: N/A  . Years of education: N/A   Occupational History  . 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 Smoking status: Former Smoker    Packs/day: 3.00    Years: 25.00    Types: Cigarettes    Quit date: 07/10/1994  . Smokeless tobacco: Never Used  . Alcohol use No  . Drug use: Unknown  . Sexual activity: Not on file   Other Topics Concern  . Not on file   Social History Narrative  . No narrative on file   Born in [place], [place]. Widowed; had been married 19 years.  Sister in law Murrell Redden; their right hand to be with her when they can't be here. Son Coralyn Pear and his  wife Beth live in [place]; always here for the major stuff Only the one son, no grandchildren.  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 Hobbies include gardening in the flowers, church work, lots of volunteer work in the community Volunteered at [employer], [employer] So her diagnosis has been a huge life change, since she was so active in the community. Loves her flower garden; has a pond with a flower in it, wind chimes, flowers.  Smoked, quit in '96. No problems with alcohol.  FAMILY HISTORY: Family History  Problem Relation Age of Onset  . Heart failure Mother   . Cancer Father    Mother would have been 32 in 10/03/22; died in July 04, 2023 She was very  healthy; mind and memory really good; active until the last 4-5 months of her life. Died of congestive heart failure; had lung and breathing problems; her age was the main issue, "she just wore out." Father died at age 85. He had appendicitis. Had half of his stomach removed prior to the appendicitis; that's what started it.  5 brothers and 1 sister. 1 brother deceased with lung cancer, 2 or 3 years ago. He was a smoker. Everyone else is still living, in as good of health "as their age will allow them to be." No one else with myeloma.   ALLERGIES:  is allergic to bortezomib; lisinopril; and plavix [clopidogrel bisulfate].  MEDICATIONS:  Current Outpatient Prescriptions  Medication Sig Dispense Refill  . acyclovir (ZOVIRAX) 200 MG capsule Take 1 capsule (200 mg total) by mouth 2 (two) times daily. 180 capsule 1  . albuterol (PROVENTIL HFA;VENTOLIN HFA) 108 (90 Base) MCG/ACT inhaler Inhale 1-2 puffs into the lungs every 6 (six) hours as needed for wheezing or shortness of breath.    Marland Kitchen aspirin 325 MG tablet Take 325 mg by mouth daily.    Marland Kitchen atorvastatin (LIPITOR) 10 MG tablet Take 1 tablet (10 mg total) by mouth daily. 30 tablet 0  . Calcium Carb-Cholecalciferol (CALCIUM 600+D3 PO) Take 1 tablet by mouth 3 (three) times daily.    Marland Kitchen dexamethasone (DECADRON) 4 MG tablet Take 10 tablets (40 mg total) by mouth every 7 (seven) days. (Patient taking differently: Take 20 mg by mouth 2 (two) times a week. 5 tablets on Mondays and Fridays) 40 tablet 3  . diazepam (VALIUM) 5 MG tablet Take 5 mg by mouth every 6 (six) hours as needed for anxiety.    Marland Kitchen diltiazem (CARDIZEM CD) 240 MG 24 hr capsule TAKE ONE CAPSULE BY MOUTH DAILY 30 capsule 11  . fluticasone (FLONASE) 50 MCG/ACT nasal spray Place 1 spray into the nose as needed.    . furosemide (LASIX) 20 MG tablet Take 20 mg by mouth daily as needed for fluid or edema. For 7 days PRN swelling     . lenalidomide (REVLIMID) 5 MG capsule Take 1 capsule (5 mg  total) by mouth daily. 28 capsule 1  . losartan (COZAAR) 100 MG tablet Take 1 tablet (100 mg total) by mouth daily. (Patient taking differently: Take 100 mg by mouth 2 (two) times daily. ) 30 tablet 0  . magic mouthwash SOLN Swish and swallow two teaspoonfuls every 6 hours as needed. 120 mL 1  . magnesium oxide (MAG-OX) 400 MG tablet Take 1 tablet (400 mg total) by mouth 2 (two) times daily. When taking lasix 60 tablet 2  . metoprolol tartrate (LOPRESSOR) 25 MG tablet TAKE ONE TABLET BY MOUTH TWICE DAILY 180 tablet  0  . nystatin (MYCOSTATIN) 100000 UNIT/ML suspension Take 5 mLs by mouth 4 (four) times daily.    . potassium chloride (K-DUR,KLOR-CON) 10 MEQ tablet Take 1 tablet (10 mEq total) by mouth 3 (three) times daily. When taking lasix 90 tablet 2  . prochlorperazine (COMPAZINE) 10 MG tablet Take 10 mg by mouth every 6 (six) hours as needed for nausea or vomiting.     No current facility-administered medications for this visit.     Review of Systems  HENT: Negative for congestion, ear discharge, ear pain, hearing loss, nosebleeds, sore throat and tinnitus.   Eyes: Negative.  Negative for blurred vision, double vision, photophobia, pain, discharge and redness.       Vision worsening  Respiratory: Positive for shortness of breath. Negative for stridor.   Cardiovascular: Positive for orthopnea. Negative for chest pain and palpitations.  Gastrointestinal: Negative.  Negative for abdominal pain, blood in stool, constipation, diarrhea, heartburn, melena, nausea and vomiting.  Genitourinary: Negative.  Negative for dysuria, flank pain, frequency, hematuria and urgency.  Musculoskeletal: Negative.  Negative for back pain, falls, joint pain, myalgias and neck pain.  Skin: Negative.  Negative for itching and rash.  Neurological: Negative for dizziness, tingling, tremors, sensory change, speech change, focal weakness, seizures, loss of consciousness and headaches.  Endo/Heme/Allergies: Negative.     Psychiatric/Behavioral: Negative.   All other systems reviewed and are negative.  14 point ROS was done and is otherwise as detailed above or in HPI   PHYSICAL EXAMINATION: ECOG PERFORMANCE STATUS: 1 - Symptomatic but completely ambulatory  Vitals with BMI 06/08/2016  Height   Weight 154 lbs 6 oz  BMI   Systolic 412  Diastolic 50  Pulse 75  Respirations 22     Physical Exam  Constitutional: She is oriented to person, place, and time and well-developed, well-nourished, and in no distress. No distress.  HENT:  Head: Normocephalic and atraumatic.  Mouth/Throat: Oropharynx is clear and moist. No oropharyngeal exudate.  Eyes: Conjunctivae and EOM are normal. Pupils are equal, round, and reactive to light. Right eye exhibits no discharge. Left eye exhibits no discharge. No scleral icterus.  Neck: Normal range of motion. Neck supple. No thyromegaly present.  Cardiovascular: Normal rate, regular rhythm, normal heart sounds and intact distal pulses.   No murmur heard. Pulmonary/Chest: Effort normal and breath sounds normal. No respiratory distress. She has no wheezes.  Abdominal: Soft. Bowel sounds are normal.  Musculoskeletal: Normal range of motion. She exhibits no edema.  Lymphadenopathy:    She has no cervical adenopathy.  Neurological: She is alert and oriented to person, place, and time. No cranial nerve deficit.  Skin: Skin is warm and dry. No rash noted. She is not diaphoretic. No erythema.  Psychiatric: Mood, memory, affect and judgment normal.  Nursing note and vitals reviewed.   LABORATORY DATA:  I have reviewed the data as listed Lab Results  Component Value Date   WBC 12.3 (H) 06/08/2016   HGB 10.3 (L) 06/08/2016   HCT 31.9 (L) 06/08/2016   MCV 100.0 06/08/2016   PLT 148 (L) 06/08/2016   CMP     Component Value Date/Time   NA 139 06/08/2016 0820   K 3.7 06/08/2016 0820   CL 104 06/08/2016 0820   CO2 26 06/08/2016 0820   GLUCOSE 85 06/08/2016 0820   BUN  32 (H) 06/08/2016 0820   CREATININE 1.26 (H) 06/08/2016 0820   CALCIUM 8.4 (L) 06/08/2016 0820   PROT 6.3 (L) 06/08/2016 0820  ALBUMIN 3.0 (L) 06/08/2016 0820   AST 23 06/08/2016 0820   ALT 18 06/08/2016 0820   ALKPHOS 60 06/08/2016 0820   BILITOT 0.5 06/08/2016 0820   GFRNONAA 41 (L) 06/08/2016 0820   GFRAA 47 (L) 06/08/2016 0820    RADIOGRAPHIC STUDIES: I have personally reviewed the radiological images as listed and agreed with the findings in the report.  Study Result   CLINICAL DATA:  Shortness of breath.  EXAM: CHEST  2 VIEW  COMPARISON:  12/03/2015.  FINDINGS: Mediastinum and hilar structures are normal. Mild lingular atelectasis and or infiltrate. Interim clearing of right upper lobe infiltrate. Cardiomegaly with normal pulmonary vascularity. Prior cervical spine fusion .  IMPRESSION: Mild lingular atelectasis and/or infiltrate. Interim clearing of right upper lobe infiltrate.   Electronically Signed   By: [place]   On: 03/27/2016 16:45    CLINICAL DATA:  Initial treatment strategy for multiple myeloma.  EXAM: NUCLEAR MEDICINE PET WHOLE BODY  TECHNIQUE: 7.54 mCi F-18 FDG was injected intravenously. Full-ring PET imaging was performed from the vertex to the feet after the radiotracer. CT data was obtained and used for attenuation correction and anatomic localization.  FASTING BLOOD GLUCOSE:  Value:  93 mg/dl  COMPARISON:  Abdominal pelvic CT 10/07/2014.  FINDINGS: NECK  No hypermetabolic cervical lymph nodes are identified.There are no lesions of the pharyngeal mucosal space. Carotid atherosclerosis noted bilaterally.  CHEST  There are no hypermetabolic mediastinal, hilar or axillary lymph nodes. There is no suspicious pulmonary activity. Moderate emphysematous changes are present throughout the lungs. There is atherosclerosis of the aorta, great vessels and coronary arteries.  ABDOMEN/PELVIS  There is no  hypermetabolic activity within the liver, adrenal glands, spleen or pancreas. There is no hypermetabolic nodal activity. There is a left renal cysts, aortoiliac atherosclerosis and sigmoid diverticulosis.  SKELETON  There is no hypermetabolic activity to suggest osseous metastatic disease. There are mottled lucencies throughout the axial skeleton without focal lytic lesion or pathologic fracture. Lower cervical fusion noted.  Extremities: No hypermetabolic activity to suggest metastasis. There is fatty atrophy within left lower leg gastrocnemius and soleus musculature.  IMPRESSION: 1. No evidence of metabolically active multiple myeloma in the neck, chest, abdomen, pelvis or extremities. 2. No other acute or significant findings. Chronic lung disease and atherosclerosis as described.   Electronically Signed   By: Richardean Sale M.D.   On: 03/05/2015 14:59   ASSESSMENT & PLAN:  IgG kappa Myeloma Anemia Fatigue Hypogammaglobulenemia Stage 3 CKD B12 deficiency Marked elevation of ESR Stool incontinence on Velcade Severe COPD from prior tobacco abuse Diastolic CHF  She will continue on IVIG given several episodes of pneumonia. She is doing well with this. She will continue on zometa. Will drop her dose of zometa to 3 mg moving forward.   She has had very poor tolerance of multiple therapies with significant impact on her QOL, this is the best I have seen her look today. Will monitor her disease closely and if relapse noted will discuss treatment options or consider referral.   I have begun tapering her off dexamethasone.   Follow up with patient in one month.   Orders Placed This Encounter  Procedures  . CBC with Differential    Standing Status:   Future    Standing Expiration Date:   06/08/2017  . Comprehensive metabolic panel    Standing Status:   Future    Standing Expiration Date:   06/08/2017  . Protein electrophoresis, serum    Standing Status:  Future    Standing Expiration Date:   06/08/2017  . Immunofixation electrophoresis    Standing Status:   Future    Standing Expiration Date:   06/08/2017  . Kappa/lambda light chains    Standing Status:   Future    Standing Expiration Date:   06/08/2017  . Lactate dehydrogenase    Standing Status:   Future    Standing Expiration Date:   06/08/2017     All questions were answered. The patient knows to call the clinic with any problems, questions or concerns.  This document serves as a record of services personally performed by Ancil Linsey, MD. It was created on her behalf by Elmyra Ricks, a trained medical scribe. The creation of this record is based on the scribe's personal observations and the provider's statements to them. This document has been checked and approved by the attending provider.  I have reviewed the above documentation for accuracy and completeness and I agree with the above.  This note was electronically signed.   Molli Hazard, MD  06/11/2016 5:19 PM

## 2016-06-11 ENCOUNTER — Encounter (HOSPITAL_COMMUNITY): Payer: Self-pay | Admitting: Hematology & Oncology

## 2016-06-24 NOTE — Progress Notes (Signed)
Carol Bradley Date of Birth: 1940/07/19 Medical Record #[id number]  History of Present Illness: Carol Bradley is seen for follow up CAD. She has a history of COPD and mild pulmonary hypertension. She also is a history of coronary disease and is status post stenting of the mid to distal RCA in October 2012. Her presenting symptoms were of dyspnea and not chest pain. She also has labile hypertension. She developed a cough on ACE inhibitors. She developed hyponatremia on HCTZ. She has been on losartan.    She had a normal Myoview study in June. She was anemic with Hgb of 8.6. This led to hematology evaluation and bone marrow bx that demonstrated that she had Multiple myeloma. She was treated with chemotherapy. Now tapering off dexamethasone. Does note increased dyspnea on exertion over the past week. States this is similar to when she was anemic. No bleeding. No cough or fever. No chest pain.  Current Outpatient Prescriptions on File Prior to Visit  Medication Sig Dispense Refill  . acyclovir (ZOVIRAX) 200 MG capsule Take 1 capsule (200 mg total) by mouth 2 (two) times daily. 180 capsule 1  . aspirin 325 MG tablet Take 325 mg by mouth daily.    Marland Kitchen atorvastatin (LIPITOR) 10 MG tablet Take 1 tablet (10 mg total) by mouth daily. 30 tablet 0  . Calcium Carb-Cholecalciferol (CALCIUM 600+D3 PO) Take 1 tablet by mouth 3 (three) times daily.    . diazepam (VALIUM) 5 MG tablet Take 5 mg by mouth every 6 (six) hours as needed for anxiety.    Marland Kitchen diltiazem (CARDIZEM CD) 240 MG 24 hr capsule TAKE ONE CAPSULE BY MOUTH DAILY 30 capsule 11  . losartan (COZAAR) 100 MG tablet Take 1 tablet (100 mg total) by mouth daily. (Patient taking differently: Take 100 mg by mouth 2 (two) times daily. ) 30 tablet 0  . magnesium oxide (MAG-OX) 400 MG tablet Take 1 tablet (400 mg total) by mouth 2 (two) times daily. When taking lasix 60 tablet 2  . metoprolol tartrate (LOPRESSOR) 25 MG tablet TAKE ONE TABLET BY MOUTH TWICE DAILY 180  tablet 0  . nystatin (MYCOSTATIN) 100000 UNIT/ML suspension Take 5 mLs by mouth 4 (four) times daily.    . potassium chloride (K-DUR,KLOR-CON) 10 MEQ tablet Take 1 tablet (10 mEq total) by mouth 3 (three) times daily. When taking lasix 90 tablet 2  . prochlorperazine (COMPAZINE) 10 MG tablet Take 10 mg by mouth every 6 (six) hours as needed for nausea or vomiting.     No current facility-administered medications on file prior to visit.     Allergies  Allergen Reactions  . Bortezomib Other (See Comments)    Incontinence of stool with rectal sphincter dysfunction  . Lisinopril Cough  . Plavix [Clopidogrel Bisulfate] Swelling    Past Medical History:  Diagnosis Date  . CAD (coronary artery disease)   . COPD (chronic obstructive pulmonary disease) ([place])   . History of tobacco abuse   . Hyperlipidemia   . Hypertension   . 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 Multiple myeloma [facility])     Past Surgical History:  Procedure Laterality Date  . BACK SURGERY    . CARDIAC CATHETERIZATION  04/2011   right and left cath showing normal right heart pressures,but newly diagnosed coronary artery disease/drug eluting stent placed to RCA with residual disease in the proximal RCA and LAD and ramus, normal LV function and 60-65% EF  . COLONOSCOPY N/A 09/30/2014   Procedure: COLONOSCOPY;  Surgeon: Bernadene Person  Gloriann Loan, MD;  [facility];  Service: Endoscopy;  Laterality: N/A;  225  . CORONARY ANGIOPLASTY WITH STENT PLACEMENT  04/2011   mid RCA: 3.0 X38 mm Promus DES. Residual 40% disease proximally  . NECK SURGERY      History  Smoking Status  . Former Smoker  . Packs/day: 3.00  . Years: 25.00  . Types: Cigarettes  . Quit date: 07/10/1994  Smokeless Tobacco  . Never Used    History  Alcohol Use No    Family History  Problem Relation Age of Onset  . Heart failure Mother   . Cancer Father     Review of Systems: As noted in HPI.  All other systems were reviewed and are  negative.  Physical Exam: BP 126/68   Pulse 98   Ht 5' 7.5" (1.715 m)   Wt 159 lb 3.2 oz (72.2 kg)   BMI 24.57 kg/m  She is a pleasant white female in no acute distress. The HEENT exam is normal. The carotids are 2+ without bruits.  There is no thyromegaly.  There is no JVD.  The lungs are clear.   The heart exam reveals a regular rate with a normal S1 and S2.  There are no murmurs, gallops, or rubs.  The PMI is not displaced.   Abdominal exam reveals good bowel sounds.   There is no hepatosplenomegaly or tenderness.  There are no masses.  Exam of the legs reveal no clubbing, cyanosis, or edema.  The legs are without rashes.  The distal pulses are intact.  Cranial nerves II - XII are intact.  Motor and sensory functions are intact.  The gait is normal.  LABORATORY DATA: Lab Results  Component Value Date   WBC 12.3 (H) 06/08/2016   HGB 10.3 (L) 06/08/2016   HCT 31.9 (L) 06/08/2016   PLT 148 (L) 06/08/2016   GLUCOSE 85 06/08/2016   CHOL 134 11/07/2013   TRIG 110.0 11/07/2013   HDL 41.90 11/07/2013   LDLCALC 70 11/07/2013   ALT 18 06/08/2016   AST 23 06/08/2016   NA 139 06/08/2016   K 3.7 06/08/2016   CL 104 06/08/2016   CREATININE 1.26 (H) 06/08/2016   BUN 32 (H) 06/08/2016   CO2 26 06/08/2016   INR 1.04 05/05/2011     Assessment / Plan: 1. Coronary disease status post stenting of the RCA in October of 2012 with a drug-eluting stent.  We will continue aspirin chronically. Continue diltiazem. Normal  stress Myoview study in June 2016. Follow up in 6 months  2. Hypercholesterolemia. Continue Lipitor therapy. Need to update lipid panel. Will add to lab work she has scheduled for Monday  3. COPD with remote history of tobacco use.  4. Hypertension- well controlled currently.  5. Renal insufficiency. Creatinine 1.26   6. Multiple myeloma- per oncology. I suspect that her dyspnea is more related to anemia. This would explain her increased HR as well. Will repeat CBC at cancer  center on Monday. If Hgb is Ok will order Echo. If Hgb is low may need transfusion.

## 2016-06-26 ENCOUNTER — Encounter: Payer: Self-pay | Admitting: Cardiology

## 2016-06-26 ENCOUNTER — Ambulatory Visit (INDEPENDENT_AMBULATORY_CARE_PROVIDER_SITE_OTHER): Payer: Medicare Other | Admitting: Cardiology

## 2016-06-26 VITALS — BP 126/68 | HR 98 | Ht 67.5 in | Wt 159.2 lb

## 2016-06-26 DIAGNOSIS — I10 Essential (primary) hypertension: Secondary | ICD-10-CM | POA: Diagnosis not present

## 2016-06-26 DIAGNOSIS — I25119 Atherosclerotic heart disease of native coronary artery with unspecified angina pectoris: Secondary | ICD-10-CM

## 2016-06-26 DIAGNOSIS — E785 Hyperlipidemia, unspecified: Secondary | ICD-10-CM | POA: Diagnosis not present

## 2016-06-26 DIAGNOSIS — I209 Angina pectoris, unspecified: Secondary | ICD-10-CM

## 2016-06-26 DIAGNOSIS — R0609 Other forms of dyspnea: Secondary | ICD-10-CM | POA: Diagnosis not present

## 2016-06-26 NOTE — Patient Instructions (Signed)
Check your blood counts Monday  Let me know if your Hgb is good in which case we may do an Echocardiogram  I will see you in 6 months.

## 2016-06-28 ENCOUNTER — Telehealth: Payer: Self-pay | Admitting: Cardiology

## 2016-06-28 ENCOUNTER — Encounter (HOSPITAL_COMMUNITY): Payer: Medicare Other | Attending: Hematology & Oncology

## 2016-06-28 ENCOUNTER — Other Ambulatory Visit (HOSPITAL_COMMUNITY): Payer: Self-pay | Admitting: Oncology

## 2016-06-28 ENCOUNTER — Other Ambulatory Visit (HOSPITAL_COMMUNITY)
Admission: RE | Admit: 2016-06-28 | Discharge: 2016-06-28 | Disposition: A | Discharge status: Home / Self Care | Payer: Medicare Other | Source: Ambulatory Visit | Attending: Cardiology | Admitting: Cardiology

## 2016-06-28 DIAGNOSIS — C9001 Multiple myeloma in remission: Secondary | ICD-10-CM | POA: Insufficient documentation

## 2016-06-28 DIAGNOSIS — E785 Hyperlipidemia, unspecified: Secondary | ICD-10-CM | POA: Diagnosis present

## 2016-06-28 LAB — LIPID PANEL
Cholesterol: 156 mg/dL (ref 0–200)
HDL: 61 mg/dL (ref >40)
LDL CALC: 70 mg/dL (ref 0–99)
TRIGLYCERIDES: 127 mg/dL (ref <150)
Total CHOL/HDL Ratio: 2.6 RATIO
VLDL: 25 mg/dL (ref 0–40)

## 2016-06-28 LAB — COMPREHENSIVE METABOLIC PANEL
ALBUMIN: 3 g/dL — AB (ref 3.5–5.0)
ALK PHOS: 64 U/L (ref 38–126)
ALT: 18 U/L (ref 14–54)
ANION GAP: 8 (ref 5–15)
AST: 30 U/L (ref 15–41)
BILIRUBIN TOTAL: 0.3 mg/dL (ref 0.3–1.2)
BUN: 34 mg/dL — ABNORMAL HIGH (ref 6–20)
CALCIUM: 8 mg/dL — AB (ref 8.9–10.3)
CO2: 22 mmol/L (ref 22–32)
Chloride: 109 mmol/L (ref 101–111)
Creatinine, Ser: 1.87 mg/dL — ABNORMAL HIGH (ref 0.44–1.00)
GFR calc non Af Amer: 25 mL/min — ABNORMAL LOW (ref >60)
GFR, EST AFRICAN AMERICAN: 29 mL/min — AB (ref >60)
GLUCOSE: 122 mg/dL — AB (ref 65–99)
POTASSIUM: 3.4 mmol/L — AB (ref 3.5–5.1)
Sodium: 139 mmol/L (ref 135–145)
TOTAL PROTEIN: 6.5 g/dL (ref 6.5–8.1)

## 2016-06-28 LAB — CBC WITH DIFFERENTIAL/PLATELET
BASOS ABS: 0 10*3/uL (ref 0.0–0.1)
BASOS PCT: 0 %
EOS PCT: 0 %
Eosinophils Absolute: 0 10*3/uL (ref 0.0–0.7)
HCT: 24.5 % — ABNORMAL LOW (ref 36.0–46.0)
Hemoglobin: 7.9 g/dL — ABNORMAL LOW (ref 12.0–15.0)
LYMPHS PCT: 11 %
Lymphs Abs: 0.9 10*3/uL (ref 0.7–4.0)
MCH: 32.6 pg (ref 26.0–34.0)
MCHC: 32.2 g/dL (ref 30.0–36.0)
MCV: 101.2 fL — AB (ref 78.0–100.0)
MONO ABS: 1 10*3/uL (ref 0.1–1.0)
Monocytes Relative: 12 %
Neutro Abs: 6.1 10*3/uL (ref 1.7–7.7)
Neutrophils Relative %: 77 %
PLATELETS: 204 10*3/uL (ref 150–400)
RBC: 2.42 MIL/uL — AB (ref 3.87–5.11)
RDW: 16.3 % — AB (ref 11.5–15.5)
WBC: 8 10*3/uL (ref 4.0–10.5)

## 2016-06-28 LAB — LACTATE DEHYDROGENASE: LDH: 226 U/L — AB (ref 98–192)

## 2016-06-28 NOTE — Telephone Encounter (Signed)
Returning your call. °

## 2016-06-28 NOTE — Telephone Encounter (Signed)
Returned call to patient no answer.Left recent lab results on personal voice mail.

## 2016-06-28 NOTE — Telephone Encounter (Signed)
Returned call to patient recent lab results given. 

## 2016-06-28 NOTE — Telephone Encounter (Signed)
Returning your call again

## 2016-06-29 ENCOUNTER — Encounter (HOSPITAL_COMMUNITY): Payer: Medicare Other

## 2016-06-29 DIAGNOSIS — C9001 Multiple myeloma in remission: Secondary | ICD-10-CM | POA: Diagnosis not present

## 2016-06-29 LAB — IMMUNOFIXATION ELECTROPHORESIS
IGA: 192 mg/dL (ref 64–422)
IGG (IMMUNOGLOBIN G), SERUM: 950 mg/dL (ref 700–1600)
IGM, SERUM: 142 mg/dL (ref 26–217)
Total Protein ELP: 6.1 g/dL (ref 6.0–8.5)

## 2016-06-29 LAB — KAPPA/LAMBDA LIGHT CHAINS
KAPPA FREE LGHT CHN: 24.3 mg/L — AB (ref 3.3–19.4)
Kappa, lambda light chain ratio: 1.22 (ref 0.26–1.65)
LAMDA FREE LIGHT CHAINS: 19.9 mg/L (ref 5.7–26.3)

## 2016-06-29 LAB — PREPARE RBC (CROSSMATCH)

## 2016-06-30 ENCOUNTER — Encounter (HOSPITAL_COMMUNITY): Payer: Self-pay

## 2016-06-30 ENCOUNTER — Encounter (HOSPITAL_COMMUNITY): Payer: Medicare Other | Attending: Oncology

## 2016-06-30 DIAGNOSIS — D649 Anemia, unspecified: Secondary | ICD-10-CM | POA: Diagnosis present

## 2016-06-30 DIAGNOSIS — C9001 Multiple myeloma in remission: Secondary | ICD-10-CM | POA: Insufficient documentation

## 2016-06-30 LAB — PROTEIN ELECTROPHORESIS, SERUM
A/G RATIO SPE: 0.9 (ref 0.7–1.7)
ALBUMIN ELP: 2.9 g/dL (ref 2.9–4.4)
ALPHA-2-GLOBULIN: 0.8 g/dL (ref 0.4–1.0)
Alpha-1-Globulin: 0.3 g/dL (ref 0.0–0.4)
Beta Globulin: 0.9 g/dL (ref 0.7–1.3)
GLOBULIN, TOTAL: 3.1 g/dL (ref 2.2–3.9)
Gamma Globulin: 1.1 g/dL (ref 0.4–1.8)
Total Protein ELP: 6 g/dL (ref 6.0–8.5)

## 2016-06-30 MED ORDER — ACETAMINOPHEN 325 MG PO TABS
650.0000 mg | ORAL_TABLET | Freq: Once | ORAL | Status: AC
  Administered 2016-06-30: 650 mg via ORAL

## 2016-06-30 MED ORDER — DIPHENHYDRAMINE HCL 25 MG PO CAPS
25.0000 mg | ORAL_CAPSULE | Freq: Once | ORAL | Status: AC
  Administered 2016-06-30: 25 mg via ORAL

## 2016-06-30 MED ORDER — ACETAMINOPHEN 325 MG PO TABS
ORAL_TABLET | ORAL | Status: AC
Start: 2016-06-30 — End: 2016-06-30
  Filled 2016-06-30: qty 2

## 2016-06-30 MED ORDER — SODIUM CHLORIDE 0.9 % IV SOLN: 250.0000 mL | Freq: Once | INTRAVENOUS | Status: DC

## 2016-06-30 MED ORDER — DIPHENHYDRAMINE HCL 25 MG PO CAPS
ORAL_CAPSULE | ORAL | Status: AC
  Filled 2016-06-30: qty 1

## 2016-06-30 NOTE — Progress Notes (Signed)
Patient received 2 units of blood today. No problems noted. Vitals stable and discharged from clinic ambulatory. Follow up as scheduled.

## 2016-06-30 NOTE — Patient Instructions (Signed)
[place] at [facility] Discharge Instructions  RECOMMENDATIONS MADE BY THE CONSULTANT AND ANY TEST RESULTS WILL BE SENT TO YOUR REFERRING PHYSICIAN.  2 units of blood today  Follow up as scheduled.  Thank you for choosing [place] at [facility] to provide your oncology and hematology care.  To afford each patient quality time with our provider, please arrive at least 15 minutes before your scheduled appointment time.   Beginning January 23rd 2017 lab work for the [name] will be done in the  Main lab at [facility] on 1st floor. If you have a lab appointment with the [facility] please come in thru the  Main Entrance and check in at the main information desk  You need to re-schedule your appointment should you arrive 10 or more minutes late.  We strive to give you quality time with our providers, and arriving late affects you and other patients whose appointments are after yours.  Also, if you no show three or more times for appointments you may be dismissed from the clinic at the providers discretion.     Again, thank you for choosing [facility].  Our hope is that these requests will decrease the amount of time that you wait before being seen by our physicians.       _____________________________________________________________  Should you have questions after your visit to [facility], please contact our office at [phone] between the hours of 8:30 a.m. and 4:30 p.m.  Voicemails left after 4:30 p.m. will not be returned until the following business day.  For prescription refill requests, have your pharmacy contact our office.         Resources For Cancer Patients and their Caregivers ? American Cancer Society: Can assist with transportation, wigs, general needs, runs Look Good Feel Better.        786-756-3937 ? Cancer Care: Provides financial assistance, online support groups,  medication/co-pay assistance.  1-800-813-HOPE (726) 816-3182) ? [place] Assists [place] cancer patients and their families through emotional , educational and financial support.  773-333-2829 ? [place] DSS Where to apply for food stamps, Medicaid and utility assistance. (414)757-6686 ? [facility]: Transportation to medical appointments. 650-003-2467 ? Social Security Administration: May apply for disability if have a Stage IV cancer. 440 717 5899 616-885-5189 ? [employer], Disability and Transit Services: Assists with nutrition, care and transit needs. [place] Support Programs: @10RELATIVEDAYS @ > Cancer Support Group  2nd Tuesday of the month 1pm-2pm, Journey Room  > Creative Journey  3rd Tuesday of the month 1130am-1pm, Journey Room  > Look Good Feel Better  1st Wednesday of the month 10am-12 noon, Journey Room (Call [facility] to register [phone])

## 2016-07-01 LAB — TYPE AND SCREEN
ABO/RH(D): O POS
ANTIBODY SCREEN: NEGATIVE
UNIT DIVISION: 0
UNIT DIVISION: 0

## 2016-07-04 ENCOUNTER — Other Ambulatory Visit (HOSPITAL_COMMUNITY): Payer: Self-pay | Admitting: *Deleted

## 2016-07-04 DIAGNOSIS — D649 Anemia, unspecified: Secondary | ICD-10-CM

## 2016-07-06 ENCOUNTER — Ambulatory Visit (HOSPITAL_COMMUNITY): Payer: Medicare Other | Admitting: Hematology & Oncology

## 2016-07-06 ENCOUNTER — Other Ambulatory Visit (HOSPITAL_COMMUNITY): Payer: Medicare Other

## 2016-07-06 ENCOUNTER — Ambulatory Visit (HOSPITAL_COMMUNITY): Payer: Medicare Other

## 2016-07-13 ENCOUNTER — Encounter (HOSPITAL_COMMUNITY): Payer: Medicare Other

## 2016-07-13 ENCOUNTER — Encounter (HOSPITAL_BASED_OUTPATIENT_CLINIC_OR_DEPARTMENT_OTHER): Payer: Medicare Other

## 2016-07-13 ENCOUNTER — Encounter (HOSPITAL_COMMUNITY): Payer: Medicare Other | Attending: Hematology & Oncology | Admitting: Hematology & Oncology

## 2016-07-13 ENCOUNTER — Encounter (HOSPITAL_COMMUNITY): Payer: Self-pay | Admitting: Hematology & Oncology

## 2016-07-13 VITALS — BP 150/49 | HR 84 | Temp 97.6°F | Resp 16 | Wt 154.8 lb

## 2016-07-13 DIAGNOSIS — D801 Nonfamilial hypogammaglobulinemia: Secondary | ICD-10-CM | POA: Diagnosis present

## 2016-07-13 DIAGNOSIS — J449 Chronic obstructive pulmonary disease, unspecified: Secondary | ICD-10-CM | POA: Diagnosis not present

## 2016-07-13 DIAGNOSIS — C9001 Multiple myeloma in remission: Secondary | ICD-10-CM

## 2016-07-13 DIAGNOSIS — N183 Chronic kidney disease, stage 3 (moderate): Secondary | ICD-10-CM | POA: Diagnosis not present

## 2016-07-13 DIAGNOSIS — D649 Anemia, unspecified: Secondary | ICD-10-CM

## 2016-07-13 DIAGNOSIS — C9000 Multiple myeloma not having achieved remission: Secondary | ICD-10-CM

## 2016-07-13 DIAGNOSIS — E538 Deficiency of other specified B group vitamins: Secondary | ICD-10-CM

## 2016-07-13 DIAGNOSIS — D631 Anemia in chronic kidney disease: Secondary | ICD-10-CM | POA: Diagnosis not present

## 2016-07-13 LAB — HEPATIC FUNCTION PANEL
ALBUMIN: 3.3 g/dL — AB (ref 3.5–5.0)
ALK PHOS: 68 U/L (ref 38–126)
ALT: 13 U/L — AB (ref 14–54)
AST: 24 U/L (ref 15–41)
BILIRUBIN TOTAL: 0.6 mg/dL (ref 0.3–1.2)
Bilirubin, Direct: 0.1 mg/dL (ref 0.1–0.5)
Indirect Bilirubin: 0.5 mg/dL (ref 0.3–0.9)
Total Protein: 6.6 g/dL (ref 6.5–8.1)

## 2016-07-13 LAB — CBC WITH DIFFERENTIAL/PLATELET
BASOS ABS: 0 10*3/uL (ref 0.0–0.1)
BASOS PCT: 0 %
Eosinophils Absolute: 0 10*3/uL (ref 0.0–0.7)
Eosinophils Relative: 0 %
HEMATOCRIT: 34.6 % — AB (ref 36.0–46.0)
HEMOGLOBIN: 11.1 g/dL — AB (ref 12.0–15.0)
Lymphocytes Relative: 15 %
Lymphs Abs: 1.1 10*3/uL (ref 0.7–4.0)
MCH: 31.4 pg (ref 26.0–34.0)
MCHC: 32.1 g/dL (ref 30.0–36.0)
MCV: 97.7 fL (ref 78.0–100.0)
MONOS PCT: 12 %
Monocytes Absolute: 0.9 10*3/uL (ref 0.1–1.0)
NEUTROS ABS: 5.2 10*3/uL (ref 1.7–7.7)
NEUTROS PCT: 73 %
Platelets: 242 10*3/uL (ref 150–400)
RBC: 3.54 MIL/uL — ABNORMAL LOW (ref 3.87–5.11)
RDW: 15.6 % — ABNORMAL HIGH (ref 11.5–15.5)
WBC: 7.3 10*3/uL (ref 4.0–10.5)

## 2016-07-13 LAB — BASIC METABOLIC PANEL
ANION GAP: 5 (ref 5–15)
BUN: 23 mg/dL — ABNORMAL HIGH (ref 6–20)
CHLORIDE: 108 mmol/L (ref 101–111)
CO2: 26 mmol/L (ref 22–32)
Calcium: 8.2 mg/dL — ABNORMAL LOW (ref 8.9–10.3)
Creatinine, Ser: 1.39 mg/dL — ABNORMAL HIGH (ref 0.44–1.00)
GFR calc non Af Amer: 36 mL/min — ABNORMAL LOW (ref >60)
GFR, EST AFRICAN AMERICAN: 42 mL/min — AB (ref >60)
Glucose, Bld: 102 mg/dL — ABNORMAL HIGH (ref 65–99)
Potassium: 4.6 mmol/L (ref 3.5–5.1)
Sodium: 139 mmol/L (ref 135–145)

## 2016-07-13 LAB — SAMPLE TO BLOOD BANK

## 2016-07-13 MED ORDER — CYANOCOBALAMIN 1000 MCG/ML IJ SOLN
1000.0000 ug | Freq: Once | INTRAMUSCULAR | Status: AC
  Administered 2016-07-13: 1000 ug via INTRAMUSCULAR
  Filled 2016-07-13: qty 1

## 2016-07-13 MED ORDER — IMMUNE GLOBULIN (HUMAN) 10 GM/100ML IV SOLN
1.0000 g/kg | Freq: Once | INTRAVENOUS | Status: AC
  Administered 2016-07-13: 70 g via INTRAVENOUS
  Filled 2016-07-13: qty 100

## 2016-07-13 MED ORDER — DIPHENHYDRAMINE HCL 25 MG PO TABS
25.0000 mg | ORAL_TABLET | ORAL | Status: AC | PRN
Start: 2016-07-13 — End: 2016-07-13
  Administered 2016-07-13: 25 mg via ORAL
  Filled 2016-07-13 (×2): qty 1

## 2016-07-13 MED ORDER — DEXTROSE 5 % IV SOLN
INTRAVENOUS | Status: DC
  Administered 2016-07-13: 10:00:00 via INTRAVENOUS

## 2016-07-13 MED ORDER — ACETAMINOPHEN 325 MG PO TABS
650.0000 mg | ORAL_TABLET | Freq: Four times a day (QID) | ORAL | Status: DC | PRN
  Administered 2016-07-13: 650 mg via ORAL
  Filled 2016-07-13: qty 2

## 2016-07-13 NOTE — Patient Instructions (Addendum)
[place] at [facility] Discharge Instructions  RECOMMENDATIONS MADE BY THE CONSULTANT AND ANY TEST RESULTS WILL BE SENT TO YOUR REFERRING PHYSICIAN.  You were seen today by Dr. Whitney Muse Aranesp every 2 weeks Continue current Zometa and IVIG Follow up in 1 month with labs, visit, IVIG and Zometa    Thank you for choosing [place] at [facility] to provide your oncology and hematology care.  To afford each patient quality time with our provider, please arrive at least 15 minutes before your scheduled appointment time.    If you have a lab appointment with the [place] please come in thru the  Main Entrance and check in at the main information desk  You need to re-schedule your appointment should you arrive 10 or more minutes late.  We strive to give you quality time with our providers, and arriving late affects you and other patients whose appointments are after yours.  Also, if you no show three or more times for appointments you may be dismissed from the clinic at the providers discretion.     Again, thank you for choosing [facility].  Our hope is that these requests will decrease the amount of time that you wait before being seen by our physicians.       _____________________________________________________________  Should you have questions after your visit to [facility], please contact our office at [phone] between the hours of 8:30 a.m. and 4:30 p.m.  Voicemails left after 4:30 p.m. will not be returned until the following business day.  For prescription refill requests, have your pharmacy contact our office.       Resources For Cancer Patients and their Caregivers ? American Cancer Society: Can assist with transportation, wigs, general needs, runs Look Good Feel Better.        618-883-4210 ? Cancer Care: Provides financial assistance, online support groups, medication/co-pay assistance.   1-800-813-HOPE 4407058025) ? [place] Assists [place] cancer patients and their families through emotional , educational and financial support.  610-722-3496 ? [place] DSS Where to apply for food stamps, Medicaid and utility assistance. (520)129-8687 ? RCATS: Transportation to medical appointments. (803)371-3506 ? Social Security Administration: May apply for disability if have a Stage IV cancer. 303 619 5212 563-095-7905 ? [employer], Disability and Transit Services: Assists with nutrition, care and transit needs. [place] Support Programs: @10RELATIVEDAYS @ > Cancer Support Group  2nd Tuesday of the month 1pm-2pm, Journey Room  > Creative Journey  3rd Tuesday of the month 1130am-1pm, Journey Room  > Look Good Feel Better  1st Wednesday of the month 10am-12 noon, Journey Room (Call [employer] to register [phone])

## 2016-07-13 NOTE — Progress Notes (Signed)
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       03/18/2015 - 07/28/2015 Chemotherapy    Velcade 1.6 mg/m2 discontinued secondary to intolerance      07/28/2015 Adverse Reaction    stool incontinence, weakness, collapse upon standing, felt to be secondary to velcade      11/09/2015 - 12/13/2015 Chemotherapy    cytoxan IV 300 mg/m2 administered X 2 doses only in addition to rev/dex      11/09/2015 -  Chemotherapy    Revlimid/Dexamethasone        HISTORY OF PRESENTING ILLNESS:  Carol Bradley 76 y.o. female is here for a follow-up of Multiple Myeloma.  She continues only on IVIG and zometa.  Patient is in a Treatment chair for ongoing IVIG and accompanied by female friend.  I personally reviewed and went over laboratory results with the patient including myeloma labs.  She feels dizzy and lightheaded. She attributes this to her sinuses. Dr. Woody Seller gave her a shot and antibiotic for her sinuses. She last saw him last Friday. States she got a chest xray and it was normal and "not pneumonia." States not coughing as much at the moment. She continues to have phlegm production that is green and yellow in color. However she says she is getting better.  Patient states had good holiday. She notes that she is doing so much better off of therapy. She is able to do for herself. She was able to cook for her family over the holidays.   Denies chest pain, bowels good. appetite is mostly good but some days is better than others. No abdominal pain No swelling.   She is taking potassium and magnesium supplements as directed by Dr. Tressie Stalker. She asks if she  should continue this.   MEDICAL HISTORY:  Past Medical History:  Diagnosis Date  . CAD (coronary artery disease)   . COPD (chronic obstructive pulmonary disease) ([place])   . History of tobacco abuse   . Hyperlipidemia   . Hypertension   . 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 Multiple myeloma ([place])     SURGICAL HISTORY: Past Surgical History:  Procedure Laterality Date  . BACK SURGERY    . CARDIAC CATHETERIZATION  04/2011   right and left cath showing normal right heart pressures,but newly diagnosed coronary artery disease/drug eluting stent placed to RCA with residual disease in the proximal RCA and LAD and ramus, normal LV function and 60-65% EF  . COLONOSCOPY N/A 09/30/2014   Procedure: COLONOSCOPY;  Surgeon: Rogene Houston, MD;  Location: [facility] ENDO SUITE;  Service: Endoscopy;  Laterality: N/A;  225  . CORONARY ANGIOPLASTY WITH STENT PLACEMENT  04/2011   mid RCA: 3.0 X38 mm Promus DES. Residual 40% disease proximally  . NECK SURGERY      SOCIAL HISTORY: Social History   Social History  . Marital status: Married    Spouse name: N/A  . Number of children: N/A  . Years of education: N/A   Occupational History  . 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 Smoking status: Former Smoker    Packs/day: 3.00  Years: 25.00    Types: Cigarettes    Quit date: 07/10/1994  . Smokeless tobacco: Never Used  . Alcohol use No  . Drug use: Unknown  . Sexual activity: Not on file   Other Topics Concern  . Not on file   Social History Narrative  . No narrative on file   Born in [place], [place]. Widowed; had been married 54 years.  Sister in law Carol Bradley; their right hand to be with her when they can't be here. Son Carol Bradley and his wife Eustaquio Maize live in [place]; always here for the major stuff Only the one son, no grandchildren.  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 Hobbies include gardening in the flowers, church work,  lots of volunteer work in the community Volunteered at [employer], [employer] So her diagnosis has been a huge life change, since she was so active in the community. Loves her flower garden; has a pond with a flower in it, wind chimes, flowers.  Smoked, quit in '96. No problems with alcohol.  FAMILY HISTORY: Family History  Problem Relation Age of Onset  . Heart failure Mother   . Cancer Father    Mother would have been 69 in 27-Aug-2022; died in 05/28/2023 She was very healthy; mind and memory really good; active until the last 4-5 months of her life. Died of congestive heart failure; had lung and breathing problems; her age was the main issue, "she just wore out." Father died at age 17. He had appendicitis. Had half of his stomach removed prior to the appendicitis; that's what started it.  5 brothers and 1 sister. 1 brother deceased with lung cancer, 2 or 3 years ago. He was a smoker. Everyone else is still living, in as good of health "as their age will allow them to be." No one else with myeloma.   ALLERGIES:  is allergic to bortezomib; lisinopril; and plavix [clopidogrel bisulfate].  MEDICATIONS:  Current Outpatient Prescriptions  Medication Sig Dispense Refill  . acyclovir (ZOVIRAX) 200 MG capsule Take 1 capsule (200 mg total) by mouth 2 (two) times daily. 180 capsule 1  . aspirin 325 MG tablet Take 325 mg by mouth daily.    Marland Kitchen atorvastatin (LIPITOR) 10 MG tablet Take 1 tablet (10 mg total) by mouth daily. 30 tablet 0  . Calcium Carb-Cholecalciferol (CALCIUM 600+D3 PO) Take 1 tablet by mouth 3 (three) times daily.    . diazepam (VALIUM) 5 MG tablet Take 5 mg by mouth every 6 (six) hours as needed for anxiety.    Marland Kitchen diltiazem (CARDIZEM CD) 240 MG 24 hr capsule TAKE ONE CAPSULE BY MOUTH DAILY 30 capsule 11  . losartan (COZAAR) 100 MG tablet Take 1 tablet (100 mg total) by mouth daily. (Patient taking differently: Take 100 mg by mouth 2 (two) times daily. ) 30 tablet 0  .  magnesium oxide (MAG-OX) 400 MG tablet Take 1 tablet (400 mg total) by mouth 2 (two) times daily. When taking lasix 60 tablet 2  . metoprolol tartrate (LOPRESSOR) 25 MG tablet TAKE ONE TABLET BY MOUTH TWICE DAILY 180 tablet 0  . nystatin (MYCOSTATIN) 100000 UNIT/ML suspension Take 5 mLs by mouth 4 (four) times daily.    . potassium chloride (K-DUR,KLOR-CON) 10 MEQ tablet Take 1 tablet (10 mEq total) by mouth 3 (three) times daily. When taking lasix 90 tablet 2  . prochlorperazine (COMPAZINE) 10 MG tablet Take 10 mg by mouth every 6 (six) hours as needed  for nausea or vomiting.     No current facility-administered medications for this visit.     Review of Systems  HENT: Negative for congestion, ear discharge, ear pain, hearing loss, nosebleeds, sore throat and tinnitus.   Eyes: Negative.  Negative for blurred vision, double vision, photophobia, pain, discharge and redness.       Vision worsening  Respiratory: Positive for shortness of breath. Negative for stridor.   Cardiovascular: Negative for palpitations.  Gastrointestinal: Negative.  Negative for abdominal pain, blood in stool, constipation, diarrhea, heartburn, melena, nausea and vomiting.  Genitourinary: Negative.  Negative for dysuria, flank pain, frequency, hematuria and urgency.  Musculoskeletal: Negative.  Negative for back pain, falls, joint pain, myalgias and neck pain.  Skin: Negative.  Negative for itching and rash.  Neurological: Positive for dizziness. Negative for tingling, tremors, sensory change, speech change, focal weakness, seizures, loss of consciousness and headaches.       Light headedness  Endo/Heme/Allergies: Positive for environmental allergies.  Psychiatric/Behavioral: Negative.   All other systems reviewed and are negative.  14 point ROS was done and is otherwise as detailed above or in HPI   PHYSICAL EXAMINATION: ECOG PERFORMANCE STATUS: 1 - Symptomatic but completely ambulatory Vitals with BMI 07/13/2016    Height   Weight 154 lbs 13 oz  BMI   Systolic 326  Diastolic 59  Pulse 88  Respirations 18     Physical Exam  Constitutional: She is oriented to person, place, and time and well-developed, well-nourished, and in no distress. No distress.  HENT:  Head: Normocephalic and atraumatic.  Mouth/Throat: Oropharynx is clear and moist. No oropharyngeal exudate.  Eyes: Conjunctivae and EOM are normal. Pupils are equal, round, and reactive to light. Right eye exhibits no discharge. Left eye exhibits no discharge. No scleral icterus.  Neck: Normal range of motion. Neck supple. No thyromegaly present.  Cardiovascular: Normal rate, regular rhythm, normal heart sounds and intact distal pulses.   No murmur heard. Pulmonary/Chest: Effort normal and breath sounds normal. No respiratory distress. She has no wheezes.  Abdominal: Soft. Bowel sounds are normal.  Musculoskeletal: Normal range of motion. She exhibits no edema.  Lymphadenopathy:    She has no cervical adenopathy.  Neurological: She is alert and oriented to person, place, and time. No cranial nerve deficit.  Skin: Skin is warm and dry. No rash noted. She is not diaphoretic. No erythema.  Psychiatric: Mood, memory, affect and judgment normal.  Nursing note and vitals reviewed.   LABORATORY DATA:  I have reviewed the data as listed Lab Results  Component Value Date   WBC 8.0 06/28/2016   HGB 7.9 (L) 06/28/2016   HCT 24.5 (L) 06/28/2016   MCV 101.2 (H) 06/28/2016   PLT 204 06/28/2016   CMP     Component Value Date/Time   NA 139 06/28/2016 1157   K 3.4 (L) 06/28/2016 1157   CL 109 06/28/2016 1157   CO2 22 06/28/2016 1157   GLUCOSE 122 (H) 06/28/2016 1157   BUN 34 (H) 06/28/2016 1157   CREATININE 1.87 (H) 06/28/2016 1157   CALCIUM 8.0 (L) 06/28/2016 1157   PROT 6.5 06/28/2016 1157   ALBUMIN 3.0 (L) 06/28/2016 1157   AST 30 06/28/2016 1157   ALT 18 06/28/2016 1157   ALKPHOS 64 06/28/2016 1157   BILITOT 0.3 06/28/2016 1157    GFRNONAA 25 (L) 06/28/2016 1157   GFRAA 29 (L) 06/28/2016 1157      Results for REMI, RESTER (MRN [id number]  Ref. Range  03/16/2016 09:44 03/27/2016 16:17 03/30/2016 11:10 04/13/2016 08:50 05/11/2016 09:13 06/08/2016 08:20 06/28/2016 11:57 07/13/2016 09:06  Hemoglobin Latest Ref Range: 12.0 - 15.0 g/dL 10.4 (L) 10.2 (L) 9.5 (L) 8.4 (L) 8.9 (L) 10.3 (L) 7.9 (L) 11.1 (L)       RADIOGRAPHIC STUDIES: I have personally reviewed the radiological images as listed and agreed with the findings in the report.  Study Result   CLINICAL DATA:  Shortness of breath.  EXAM: CHEST  2 VIEW  COMPARISON:  12/03/2015.  FINDINGS: Mediastinum and hilar structures are normal. Mild lingular atelectasis and or infiltrate. Interim clearing of right upper lobe infiltrate. Cardiomegaly with normal pulmonary vascularity. Prior cervical spine fusion .  IMPRESSION: Mild lingular atelectasis and/or infiltrate. Interim clearing of right upper lobe infiltrate.   Electronically Signed   By: Solomon   On: 03/27/2016 16:45    CLINICAL DATA:  Initial treatment strategy for multiple myeloma.  EXAM: NUCLEAR MEDICINE PET WHOLE BODY  TECHNIQUE: 7.54 mCi F-18 FDG was injected intravenously. Full-ring PET imaging was performed from the vertex to the feet after the radiotracer. CT data was obtained and used for attenuation correction and anatomic localization.  FASTING BLOOD GLUCOSE:  Value:  93 mg/dl  COMPARISON:  Abdominal pelvic CT 10/07/2014.  FINDINGS: NECK  No hypermetabolic cervical lymph nodes are identified.There are no lesions of the pharyngeal mucosal space. Carotid atherosclerosis noted bilaterally.  CHEST  There are no hypermetabolic mediastinal, hilar or axillary lymph nodes. There is no suspicious pulmonary activity. Moderate emphysematous changes are present throughout the lungs. There is atherosclerosis of the aorta, great vessels and coronary  arteries.  ABDOMEN/PELVIS  There is no hypermetabolic activity within the liver, adrenal glands, spleen or pancreas. There is no hypermetabolic nodal activity. There is a left renal cysts, aortoiliac atherosclerosis and sigmoid diverticulosis.  SKELETON  There is no hypermetabolic activity to suggest osseous metastatic disease. There are mottled lucencies throughout the axial skeleton without focal lytic lesion or pathologic fracture. Lower cervical fusion noted.  Extremities: No hypermetabolic activity to suggest metastasis. There is fatty atrophy within left lower leg gastrocnemius and soleus musculature.  IMPRESSION: 1. No evidence of metabolically active multiple myeloma in the neck, chest, abdomen, pelvis or extremities. 2. No other acute or significant findings. Chronic lung disease and atherosclerosis as described.   Electronically Signed   By: Richardean Sale M.D.   On: 03/05/2015 14:59   ASSESSMENT & PLAN:  IgG kappa Myeloma Anemia Fatigue Hypogammaglobulenemia Stage 3 CKD B12 deficiency Marked elevation of ESR Stool incontinence on Velcade Severe COPD from prior tobacco abuse Diastolic CHF  She will continue on IVIG given several episodes of pneumonia. She is doing well with this. She will continue on zometa. Will drop her dose of zometa to 3 mg moving forward.   She has had very poor tolerance of multiple therapies with significant impact on her QOL, this is the best I have seen her look today. Will monitor her disease closely and if relapse noted will discuss treatment options or consider referral.   Labs reviewed, results noted above.   I discussed ESA therapy with her today as I suspect at this point that her CKD is also a strong contributor to her ongoing anemia. Given normal myeloma studies I do not feel the need to re-marrow.  She will call Dr. Woody Seller if sinus infection has not resolved by Monday.  I have advised her to continue taking  magnesium and potassium once daily. I advised she can discontinue  acyclovir if she would like.   She is scheduled for follow on 08/10/16. At this next visit she will be given a 24 hr urine. No evidence of M spike on SPEP/IEP.  Orders Placed This Encounter  Procedures  . CBC with Differential    Standing Status:   Future    Standing Expiration Date:   07/13/2017  . Comprehensive metabolic panel    Standing Status:   Future    Standing Expiration Date:   07/13/2017  . Potassium    Standing Status:   Future    Standing Expiration Date:   07/13/2017  . Magnesium    Standing Status:   Future    Standing Expiration Date:   07/13/2017  . Kappa/lambda light chains    Standing Status:   Future    Standing Expiration Date:   07/13/2017  . Protein electrophoresis, serum    Standing Status:   Future    Standing Expiration Date:   07/13/2017  . Immunofixation electrophoresis    Standing Status:   Future    Standing Expiration Date:   07/13/2017  . Protein electrophoresis, urine    Standing Status:   Future    Standing Expiration Date:   07/13/2017  . Immunofixation, urine    Standing Status:   Future    Standing Expiration Date:   07/13/2017   All questions were answered. The patient knows to call the clinic with any problems, questions or concerns.  This document serves as a record of services personally performed by Ancil Linsey, MD. It was created on her behalf by Shirlean Mylar, a trained medical scribe. The creation of this record is based on the scribe's personal observations and the provider's statements to them. This document has been checked and approved by the attending provider.   I have reviewed the above documentation for accuracy and completeness and I agree with the above.  This note was electronically signed.   Molli Hazard, MD  07/13/2016 8:52 AM

## 2016-07-13 NOTE — Patient Instructions (Addendum)
[place] at [facility] Discharge Instructions  RECOMMENDATIONS MADE BY THE CONSULTANT AND ANY TEST RESULTS WILL BE SENT TO YOUR REFERRING PHYSICIAN.  Received IVIG  infusion as well as Vit B12 injection today. Follow-up as scheduled. Call clinic for any questions or concerns  Thank you for choosing [place] at [facility] to provide your oncology and hematology care.  To afford each patient quality time with our provider, please arrive at least 15 minutes before your scheduled appointment time.    If you have a lab appointment with the [place] please come in thru the  Main Entrance and check in at the main information desk  You need to re-schedule your appointment should you arrive 10 or more minutes late.  We strive to give you quality time with our providers, and arriving late affects you and other patients whose appointments are after yours.  Also, if you no show three or more times for appointments you may be dismissed from the clinic at the providers discretion.     Again, thank you for choosing [facility].  Our hope is that these requests will decrease the amount of time that you wait before being seen by our physicians.       _____________________________________________________________  Should you have questions after your visit to [facility], please contact our office at [phone] between the hours of 8:30 a.m. and 4:30 p.m.  Voicemails left after 4:30 p.m. will not be returned until the following business day.  For prescription refill requests, have your pharmacy contact our office.       Resources For Cancer Patients and their Caregivers ? American Cancer Society: Can assist with transportation, wigs, general needs, runs Look Good Feel Better.        619-251-6021 ? Cancer Care: Provides financial assistance, online support groups, medication/co-pay assistance.  1-800-813-HOPE  510-443-0395) ? [place] Assists [place] cancer patients and their families through emotional , educational and financial support.  351-597-3030 ? [place] DSS Where to apply for food stamps, Medicaid and utility assistance. (458) 638-4496 ? RCATS: Transportation to medical appointments. (234)447-2174 ? Social Security Administration: May apply for disability if have a Stage IV cancer. 279-788-9697 8586448862 ? [employer], Disability and Transit Services: Assists with nutrition, care and transit needs. [place] Support Programs: @10RELATIVEDAYS @ > Cancer Support Group  2nd Tuesday of the month 1pm-2pm, Journey Room  > Creative Journey  3rd Tuesday of the month 1130am-1pm, Journey Room  > Look Good Feel Better  1st Wednesday of the month 10am-12 noon, Journey Room (Call [place] to register [phone])

## 2016-07-13 NOTE — Progress Notes (Signed)
Carol Bradley tolerated IVIG infusion and Vit B12 injection well without complaints or incident. Labs reviewed with MD and Zometa held due to low corrected Calcium. VSS upon discharge. Pt discharged self ambulatory using cane in satisfactory condition accompanied by a friend

## 2016-07-22 ENCOUNTER — Other Ambulatory Visit: Payer: Self-pay | Admitting: Cardiology

## 2016-07-26 ENCOUNTER — Other Ambulatory Visit (HOSPITAL_COMMUNITY): Payer: Self-pay | Admitting: *Deleted

## 2016-07-26 DIAGNOSIS — C9000 Multiple myeloma not having achieved remission: Secondary | ICD-10-CM

## 2016-07-27 ENCOUNTER — Ambulatory Visit (HOSPITAL_COMMUNITY): Payer: Medicare Other

## 2016-07-27 ENCOUNTER — Other Ambulatory Visit (HOSPITAL_COMMUNITY): Payer: Self-pay | Admitting: *Deleted

## 2016-07-27 ENCOUNTER — Other Ambulatory Visit (HOSPITAL_COMMUNITY): Payer: Medicare Other

## 2016-07-27 DIAGNOSIS — C9000 Multiple myeloma not having achieved remission: Secondary | ICD-10-CM

## 2016-07-28 ENCOUNTER — Other Ambulatory Visit (HOSPITAL_COMMUNITY): Payer: Medicare Other

## 2016-07-28 ENCOUNTER — Ambulatory Visit (HOSPITAL_COMMUNITY): Payer: Medicare Other

## 2016-08-07 ENCOUNTER — Other Ambulatory Visit (HOSPITAL_COMMUNITY): Payer: Self-pay | Admitting: Oncology

## 2016-08-07 DIAGNOSIS — C9001 Multiple myeloma in remission: Secondary | ICD-10-CM

## 2016-08-08 ENCOUNTER — Encounter (HOSPITAL_COMMUNITY): Payer: Self-pay | Admitting: Hematology & Oncology

## 2016-08-10 ENCOUNTER — Encounter (HOSPITAL_COMMUNITY): Payer: Self-pay

## 2016-08-10 ENCOUNTER — Encounter (HOSPITAL_COMMUNITY): Payer: Medicare Other

## 2016-08-10 ENCOUNTER — Encounter (HOSPITAL_BASED_OUTPATIENT_CLINIC_OR_DEPARTMENT_OTHER): Payer: Medicare Other | Admitting: Oncology

## 2016-08-10 ENCOUNTER — Encounter (HOSPITAL_COMMUNITY): Payer: Medicare Other | Attending: Oncology

## 2016-08-10 VITALS — BP 129/55 | HR 85 | Temp 97.7°F | Resp 18

## 2016-08-10 VITALS — BP 122/60 | HR 72 | Temp 97.9°F | Resp 16 | Wt 155.0 lb

## 2016-08-10 DIAGNOSIS — J449 Chronic obstructive pulmonary disease, unspecified: Secondary | ICD-10-CM | POA: Insufficient documentation

## 2016-08-10 DIAGNOSIS — I5030 Unspecified diastolic (congestive) heart failure: Secondary | ICD-10-CM | POA: Insufficient documentation

## 2016-08-10 DIAGNOSIS — D631 Anemia in chronic kidney disease: Secondary | ICD-10-CM

## 2016-08-10 DIAGNOSIS — C9000 Multiple myeloma not having achieved remission: Secondary | ICD-10-CM

## 2016-08-10 DIAGNOSIS — D801 Nonfamilial hypogammaglobulinemia: Secondary | ICD-10-CM | POA: Diagnosis present

## 2016-08-10 DIAGNOSIS — R159 Full incontinence of feces: Secondary | ICD-10-CM | POA: Diagnosis not present

## 2016-08-10 DIAGNOSIS — C9001 Multiple myeloma in remission: Secondary | ICD-10-CM

## 2016-08-10 DIAGNOSIS — Z87891 Personal history of nicotine dependence: Secondary | ICD-10-CM | POA: Diagnosis not present

## 2016-08-10 DIAGNOSIS — Z79899 Other long term (current) drug therapy: Secondary | ICD-10-CM | POA: Diagnosis not present

## 2016-08-10 DIAGNOSIS — E538 Deficiency of other specified B group vitamins: Secondary | ICD-10-CM | POA: Diagnosis not present

## 2016-08-10 DIAGNOSIS — N183 Chronic kidney disease, stage 3 (moderate): Secondary | ICD-10-CM | POA: Insufficient documentation

## 2016-08-10 DIAGNOSIS — R5383 Other fatigue: Secondary | ICD-10-CM | POA: Insufficient documentation

## 2016-08-10 DIAGNOSIS — I130 Hypertensive heart and chronic kidney disease with heart failure and stage 1 through stage 4 chronic kidney disease, or unspecified chronic kidney disease: Secondary | ICD-10-CM | POA: Diagnosis not present

## 2016-08-10 DIAGNOSIS — D649 Anemia, unspecified: Secondary | ICD-10-CM | POA: Diagnosis not present

## 2016-08-10 DIAGNOSIS — Z7982 Long term (current) use of aspirin: Secondary | ICD-10-CM | POA: Insufficient documentation

## 2016-08-10 LAB — COMPREHENSIVE METABOLIC PANEL
ALT: 13 U/L — ABNORMAL LOW (ref 14–54)
ANION GAP: 9 (ref 5–15)
AST: 27 U/L (ref 15–41)
Albumin: 3.2 g/dL — ABNORMAL LOW (ref 3.5–5.0)
Alkaline Phosphatase: 61 U/L (ref 38–126)
BUN: 17 mg/dL (ref 6–20)
CHLORIDE: 105 mmol/L (ref 101–111)
CO2: 25 mmol/L (ref 22–32)
Calcium: 8.7 mg/dL — ABNORMAL LOW (ref 8.9–10.3)
Creatinine, Ser: 1.41 mg/dL — ABNORMAL HIGH (ref 0.44–1.00)
GFR calc non Af Amer: 35 mL/min — ABNORMAL LOW (ref >60)
GFR, EST AFRICAN AMERICAN: 41 mL/min — AB (ref >60)
Glucose, Bld: 91 mg/dL (ref 65–99)
POTASSIUM: 3.6 mmol/L (ref 3.5–5.1)
SODIUM: 139 mmol/L (ref 135–145)
Total Bilirubin: 0.5 mg/dL (ref 0.3–1.2)
Total Protein: 6.2 g/dL — ABNORMAL LOW (ref 6.5–8.1)

## 2016-08-10 LAB — CBC WITH DIFFERENTIAL/PLATELET
Basophils Absolute: 0 10*3/uL (ref 0.0–0.1)
Basophils Relative: 0 %
EOS ABS: 0.1 10*3/uL (ref 0.0–0.7)
EOS PCT: 1 %
HCT: 30 % — ABNORMAL LOW (ref 36.0–46.0)
Hemoglobin: 10.2 g/dL — ABNORMAL LOW (ref 12.0–15.0)
LYMPHS ABS: 1.1 10*3/uL (ref 0.7–4.0)
Lymphocytes Relative: 14 %
MCH: 32.8 pg (ref 26.0–34.0)
MCHC: 34 g/dL (ref 30.0–36.0)
MCV: 96.5 fL (ref 78.0–100.0)
MONOS PCT: 11 %
Monocytes Absolute: 0.9 10*3/uL (ref 0.1–1.0)
Neutro Abs: 5.6 10*3/uL (ref 1.7–7.7)
Neutrophils Relative %: 74 %
PLATELETS: 207 10*3/uL (ref 150–400)
RBC: 3.11 MIL/uL — AB (ref 3.87–5.11)
RDW: 15.7 % — ABNORMAL HIGH (ref 11.5–15.5)
WBC: 7.6 10*3/uL (ref 4.0–10.5)

## 2016-08-10 LAB — SAMPLE TO BLOOD BANK

## 2016-08-10 LAB — MAGNESIUM: Magnesium: 2 mg/dL (ref 1.7–2.4)

## 2016-08-10 MED ORDER — IMMUNE GLOBULIN (HUMAN) 10 GM/100ML IV SOLN
1.0000 g/kg | Freq: Once | INTRAVENOUS | Status: AC
  Administered 2016-08-10: 70 g via INTRAVENOUS
  Filled 2016-08-10: qty 600

## 2016-08-10 MED ORDER — CYANOCOBALAMIN 1000 MCG/ML IJ SOLN
1000.0000 ug | Freq: Once | INTRAMUSCULAR | Status: AC
  Administered 2016-08-10: 1000 ug via INTRAMUSCULAR
  Filled 2016-08-10: qty 1

## 2016-08-10 MED ORDER — SODIUM CHLORIDE 0.9 % IV SOLN
3.0000 mg | Freq: Once | INTRAVENOUS | Status: AC
  Administered 2016-08-10: 3 mg via INTRAVENOUS
  Filled 2016-08-10: qty 3.75

## 2016-08-10 MED ORDER — DEXTROSE 5 % IV SOLN
INTRAVENOUS | Status: DC
  Administered 2016-08-10: 10:00:00 via INTRAVENOUS

## 2016-08-10 MED ORDER — DARBEPOETIN ALFA 200 MCG/0.4ML IJ SOSY
200.0000 ug | PREFILLED_SYRINGE | Freq: Once | INTRAMUSCULAR | Status: AC
  Administered 2016-08-10: 200 ug via SUBCUTANEOUS
  Filled 2016-08-10: qty 0.4

## 2016-08-10 MED ORDER — DIPHENHYDRAMINE HCL 25 MG PO TABS
25.0000 mg | ORAL_TABLET | ORAL | Status: AC | PRN
  Administered 2016-08-10: 25 mg via ORAL
  Filled 2016-08-10 (×2): qty 1

## 2016-08-10 MED ORDER — ACETAMINOPHEN 325 MG PO TABS
650.0000 mg | ORAL_TABLET | Freq: Four times a day (QID) | ORAL | Status: DC | PRN
  Administered 2016-08-10: 650 mg via ORAL
  Filled 2016-08-10: qty 2

## 2016-08-10 MED ORDER — SODIUM CHLORIDE 0.9 % IV SOLN
INTRAVENOUS | Status: DC
  Administered 2016-08-10: 14:00:00 via INTRAVENOUS

## 2016-08-10 NOTE — Patient Instructions (Signed)
[place] at [facility] Discharge Instructions  RECOMMENDATIONS MADE BY THE CONSULTANT AND ANY TEST RESULTS WILL BE SENT TO YOUR REFERRING PHYSICIAN.  Received IVIG, Zometa, Vit B12 injection and Aranesp injection today. Follow-up as scheduled. Call clinic for any questions or concerns  Thank you for choosing [place] at [facility] to provide your oncology and hematology care.  To afford each patient quality time with our provider, please arrive at least 15 minutes before your scheduled appointment time.    If you have a lab appointment with the [place] please come in thru the  Main Entrance and check in at the main information desk  You need to re-schedule your appointment should you arrive 10 or more minutes late.  We strive to give you quality time with our providers, and arriving late affects you and other patients whose appointments are after yours.  Also, if you no show three or more times for appointments you may be dismissed from the clinic at the providers discretion.     Again, thank you for choosing [facility].  Our hope is that these requests will decrease the amount of time that you wait before being seen by our physicians.       _____________________________________________________________  Should you have questions after your visit to [facility], please contact our office at [phone] between the hours of 8:30 a.m. and 4:30 p.m.  Voicemails left after 4:30 p.m. will not be returned until the following business day.  For prescription refill requests, have your pharmacy contact our office.       Resources For Cancer Patients and their Caregivers ? American Cancer Society: Can assist with transportation, wigs, general needs, runs Look Good Feel Better.        254-493-4791 ? Cancer Care: Provides financial assistance, online support groups, medication/co-pay assistance.  1-800-813-HOPE  626-649-3152) ? [place] Assists [place] cancer patients and their families through emotional , educational and financial support.  (515) 240-1695 ? [place] DSS Where to apply for food stamps, Medicaid and utility assistance. 236-671-1407 ? RCATS: Transportation to medical appointments. 580-036-2412 ? Social Security Administration: May apply for disability if have a Stage IV cancer. 702-033-3701 (847) 647-3718 ? [employer], Disability and Transit Services: Assists with nutrition, care and transit needs. [place] Support Programs: @10RELATIVEDAYS @ > Cancer Support Group  2nd Tuesday of the month 1pm-2pm, Journey Room  > Creative Journey  3rd Tuesday of the month 1130am-1pm, Journey Room  > Look Good Feel Better  1st Wednesday of the month 10am-12 noon, Journey Room (Call [place] to register [phone])

## 2016-08-10 NOTE — Progress Notes (Signed)
[place]  Progress Note  Patient Care Team: [name], MD as PCP - General (Internal Medicine)  CHIEF COMPLAINTS/PURPOSE OF CONSULTATION:  Multiple Myeloma    Multiple myeloma ([place])   02/26/2015 Initial Biopsy    BMBX 50% cellularity, igG kappa myeloma, IgG at 3600 mg/dl, FISH with monosomy of chromosome 13, gain 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       03/18/2015 - 07/28/2015 Chemotherapy    Velcade 1.6 mg/m2 discontinued secondary to intolerance      07/28/2015 Adverse Reaction    stool incontinence, weakness, collapse upon standing, felt to be secondary to velcade      11/09/2015 - 12/13/2015 Chemotherapy    cytoxan IV 300 mg/m2 administered X 2 doses only in addition to rev/dex      11/09/2015 -  Chemotherapy    Revlimid/Dexamethasone        HISTORY OF PRESENTING ILLNESS:  Carol Bradley 76 y.o. female is here for a follow-up of Multiple Myeloma.  She continues only on IVIG and zometa.  I personally reviewed and went over laboratory results with the patient including myeloma labs.  Patient presents today for continued followed of myloema which is in remission.  Currently only receiving monthly ivig, vit b12 , Zometa.  States she feels well., but  noted feeling a little more fatigued  Her hemoglobin is  10.2 g/dL.  She also complains of having breakage of her nails over the past few weeks. Otherwise no other complaints.    MEDICAL HISTORY:  Past Medical History:  Diagnosis Date  . CAD (coronary artery disease)   . COPD (chronic obstructive pulmonary disease) ([place])   . History of tobacco abuse   . Hyperlipidemia   . Hypertension   . 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 Multiple myeloma ([place])     SURGICAL HISTORY: Past Surgical History:  Procedure Laterality Date  . BACK SURGERY    . CARDIAC CATHETERIZATION  04/2011   right and left cath showing normal right heart pressures,but newly diagnosed coronary artery  disease/drug eluting stent placed to RCA with residual disease in the proximal RCA and LAD and ramus, normal LV function and 60-65% EF  . COLONOSCOPY N/A 09/30/2014   Procedure: COLONOSCOPY;  Surgeon: Rogene Houston, MD;  Location: [facility];  Service: Endoscopy;  Laterality: N/A;  225  . CORONARY ANGIOPLASTY WITH STENT PLACEMENT  04/2011   mid RCA: 3.0 X38 mm Promus DES. Residual 40% disease proximally  . NECK SURGERY      SOCIAL HISTORY: Social History   Social History  . Marital status: Married    Spouse name: N/A  . Number of children: N/A  . Years of education: N/A   Occupational History  . 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 Smoking status: Former Smoker    Packs/day: 3.00    Years: 25.00    Types: Cigarettes    Quit date: 07/10/1994  . Smokeless tobacco: Never Used  . Alcohol use No  . Drug use: Unknown  . Sexual activity: Not on file   Other Topics Concern  . Not on file   Social History Narrative  . No narrative on file   Born in [place], [place]. Widowed; had been married 57 years.  Sister in law Murrell Redden; their right hand to be with her when they can't be here. Son Coralyn Pear and his wife Eustaquio Maize live in [place]; always here for the  major stuff Only the one son, no grandchildren.  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 Hobbies include gardening in the flowers, church work, lots of volunteer work in the community Volunteered at [employer], [employer] So her diagnosis has been a huge life change, since she was so active in the community. Loves her flower garden; has a pond with a flower in it, wind chimes, flowers.  Smoked, quit in '96. No problems with alcohol.  FAMILY HISTORY: Family History  Problem Relation Age of Onset  . Heart failure Mother   . Cancer Father    Mother would have been 93 in 2022/09/15; died in 2023/06/16 She was very healthy; mind and memory really good; active  until the last 4-5 months of her life. Died of congestive heart failure; had lung and breathing problems; her age was the main issue, "she just wore out." Father died at age 64. He had appendicitis. Had half of his stomach removed prior to the appendicitis; that's what started it.  5 brothers and 1 sister. 1 brother deceased with lung cancer, 2 or 3 years ago. He was a smoker. Everyone else is still living, in as good of health "as their age will allow them to be." No one else with myeloma.   ALLERGIES:  is allergic to bortezomib; lisinopril; and plavix [clopidogrel bisulfate].  MEDICATIONS:  Current Outpatient Prescriptions  Medication Sig Dispense Refill  . acyclovir (ZOVIRAX) 200 MG capsule TAKE ONE CAPSULE BY MOUTH TWICE DAILY 180 capsule 1  . aspirin 325 MG tablet Take 325 mg by mouth daily.    Marland Kitchen atorvastatin (LIPITOR) 10 MG tablet Take 1 tablet (10 mg total) by mouth daily. 30 tablet 0  . Calcium Carb-Cholecalciferol (CALCIUM 600+D3 PO) Take 1 tablet by mouth 3 (three) times daily.    . diazepam (VALIUM) 5 MG tablet Take 5 mg by mouth every 6 (six) hours as needed for anxiety.    Marland Kitchen diltiazem (CARDIZEM CD) 240 MG 24 hr capsule TAKE ONE CAPSULE BY MOUTH DAILY 30 capsule 1  . losartan (COZAAR) 100 MG tablet Take 1 tablet (100 mg total) by mouth daily. (Patient taking differently: Take 100 mg by mouth 2 (two) times daily. ) 30 tablet 0  . magnesium oxide (MAG-OX) 400 MG tablet Take 1 tablet (400 mg total) by mouth 2 (two) times daily. When taking lasix 60 tablet 2  . metoprolol tartrate (LOPRESSOR) 25 MG tablet TAKE ONE TABLET BY MOUTH TWICE DAILY 180 tablet 0  . nystatin (MYCOSTATIN) 100000 UNIT/ML suspension Take 5 mLs by mouth 4 (four) times daily.    . potassium chloride (K-DUR,KLOR-CON) 10 MEQ tablet Take 1 tablet (10 mEq total) by mouth 3 (three) times daily. When taking lasix 90 tablet 2  . prochlorperazine (COMPAZINE) 10 MG tablet Take 10 mg by mouth every 6 (six) hours as needed  for nausea or vomiting.     No current facility-administered medications for this visit.    Facility-Administered Medications Ordered in Other Visits  Medication Dose Route Frequency Provider Last Rate Last Dose  . acetaminophen (TYLENOL) tablet 650 mg  650 mg Oral Q6H PRN Allene Pyo, MD   650 mg at 08/10/16 1008  . cyanocobalamin ((VITAMIN B-12)) injection 1,000 mcg  1,000 mcg Intramuscular Once Allene Pyo, MD      . Darbepoetin Alfa (ARANESP) injection 200 mcg  200 mcg Subcutaneous Once Ralene Cork, MD      . dextrose 5 % solution  Intravenous Continuous Twana First, MD      . Immune Globulin 10% (PRIVIGEN) IV infusion 70 g  1 g/kg Intravenous Once Patrici Ranks, MD      . zolendronic acid (ZOMETA) 3 mg in sodium chloride 0.9 % 100 mL IVPB  3 mg Intravenous Once Patrici Ranks, MD         Review of Systems  Constitutional: Positive for malaise/fatigue.       Nail breakage over the last few weeks.  HENT: Negative.   Eyes: Negative.   Respiratory: Negative.   Cardiovascular: Negative.   Gastrointestinal: Negative.   Genitourinary: Negative.   Musculoskeletal: Negative.   Skin: Negative.   Neurological: Negative.   Endo/Heme/Allergies: Negative.   Psychiatric/Behavioral: Negative.   All other systems reviewed and are negative.  14 point ROS was done and is otherwise as detailed above or in HPI   PHYSICAL EXAMINATION: ECOG PERFORMANCE STATUS: 1 - Symptomatic but completely ambulatory  Vitals:   08/10/16 0900  BP: 122/60  Pulse: 72  Resp: 16  Temp: 97.9 F (36.6 C)     Physical Exam  Constitutional: She is oriented to person, place, and time and well-developed, well-nourished, and in no distress. No distress.  Nail breakage in hands bilateral.  HENT:  Head: Normocephalic and atraumatic.  Mouth/Throat: Oropharynx is clear and moist. No oropharyngeal exudate.  Eyes: Conjunctivae and EOM are normal. Pupils are equal, round, and reactive to light.  Right eye exhibits no discharge. Left eye exhibits no discharge. No scleral icterus.  Neck: Normal range of motion. Neck supple. No thyromegaly present.  Cardiovascular: Normal rate, regular rhythm, normal heart sounds and intact distal pulses.   No murmur heard. Pulmonary/Chest: Effort normal and breath sounds normal. No respiratory distress. She has no wheezes.  Abdominal: Soft. Bowel sounds are normal.  Musculoskeletal: Normal range of motion. She exhibits no edema.  Lymphadenopathy:    She has no cervical adenopathy.  Neurological: She is alert and oriented to person, place, and time. No cranial nerve deficit.  Skin: Skin is warm and dry. No rash noted. She is not diaphoretic. No erythema.  Psychiatric: Mood, memory, affect and judgment normal.  Nursing note and vitals reviewed.   LABORATORY DATA:  I have reviewed the data as listed Lab Results  Component Value Date   WBC 7.6 08/10/2016   HGB 10.2 (L) 08/10/2016   HCT 30.0 (L) 08/10/2016   MCV 96.5 08/10/2016   PLT 207 08/10/2016   CMP     Component Value Date/Time   NA 139 08/10/2016 0833   K 3.6 08/10/2016 0833   CL 105 08/10/2016 0833   CO2 25 08/10/2016 0833   GLUCOSE 91 08/10/2016 0833   BUN 17 08/10/2016 0833   CREATININE 1.41 (H) 08/10/2016 0833   CALCIUM 8.7 (L) 08/10/2016 0833   PROT 6.2 (L) 08/10/2016 0833   ALBUMIN 3.2 (L) 08/10/2016 0833   AST 27 08/10/2016 0833   ALT 13 (L) 08/10/2016 0833   ALKPHOS 61 08/10/2016 0833   BILITOT 0.5 08/10/2016 0833   GFRNONAA 35 (L) 08/10/2016 0833   GFRAA 41 (L) 08/10/2016 0833      Results for Carol, Bradley (MRN [id number]  Ref. Range 03/16/2016 09:44 03/27/2016 16:17 03/30/2016 11:10 04/13/2016 08:50 05/11/2016 09:13 06/08/2016 08:20 06/28/2016 11:57 07/13/2016 09:06  Hemoglobin Latest Ref Range: 12.0 - 15.0 g/dL 10.4 (L) 10.2 (L) 9.5 (L) 8.4 (L) 8.9 (L) 10.3 (L) 7.9 (L) 11.1 (L)       RADIOGRAPHIC  STUDIES: I have personally reviewed the radiological images as listed  and agreed with the findings in the report.  Study Result   CLINICAL DATA:  Shortness of breath.  EXAM: CHEST  2 VIEW  COMPARISON:  12/03/2015.  FINDINGS: Mediastinum and hilar structures are normal. Mild lingular atelectasis and or infiltrate. Interim clearing of right upper lobe infiltrate. Cardiomegaly with normal pulmonary vascularity. Prior cervical spine fusion .  IMPRESSION: Mild lingular atelectasis and/or infiltrate. Interim clearing of right upper lobe infiltrate.   Electronically Signed   By: Marcello Moores  Register   On: 03/27/2016 16:45     ASSESSMENT & PLAN:  IgG kappa Myeloma Anemia Fatigue Hypogammaglobulenemia Stage 3 CKD B12 deficiency Marked elevation of ESR Stool incontinence on Velcade Severe COPD from prior tobacco abuse Diastolic CHF  Continue monthly ivig, vitamin b12, Zometa.  Advised patient to hold off on getting manicures until her nails grow back. Also advised her to take a hair/skin/nails vitamin supplement.  Will add aranesp every month to help with her anemia.  RTC in 1 month for follow up with labs.     Orders Placed This Encounter  Procedures  . CBC with Differential    Standing Status:   Future    Standing Expiration Date:   09/14/2016  . Comprehensive metabolic panel    Standing Status:   Future    Standing Expiration Date:   09/14/2016  . Kappa/lambda light chains    Standing Status:   Future    Standing Expiration Date:   09/14/2016  . IgG, IgA, IgM    Standing Status:   Future    Standing Expiration Date:   09/14/2016  . Protein electrophoresis, serum    Standing Status:   Future    Standing Expiration Date:   09/14/2016  . Beta 2 microglobuline, serum    Standing Status:   Future    Standing Expiration Date:   09/14/2016   All questions were answered. The patient knows to call the clinic with any problems, questions or concerns.  This document serves as a record of services personally performed by Twana First, MD. It  was created on her behalf by Shirlean Mylar, a trained medical scribe. The creation of this record is based on the scribe's personal observations and the provider's statements to them. This document has been checked and approved by the attending provider.   I have reviewed the above documentation for accuracy and completeness and I agree with the above.  This note was electronically signed.   [employer]  08/10/2016 10:39 AM

## 2016-08-10 NOTE — Progress Notes (Signed)
Carol Bradley tolerated IVIG and Zometa infusions and Aranesp and Vit B12 injections well without complaints or incident. Labs, including Hgb 10.2 and Corrected Calcium 9.3 per pharmacist, reviewed with Dr. Talbert Cage prior to administering medication for today.Pt denied any tooth,jaw or leg pain prior to administering Zometa as well VSS upon discharge. Pt discharged self ambulatory in satisfactory condition accompanied by her friend

## 2016-08-10 NOTE — Patient Instructions (Signed)
[place] at [facility] Discharge Instructions  RECOMMENDATIONS MADE BY THE CONSULTANT AND ANY TEST RESULTS WILL BE SENT TO YOUR REFERRING PHYSICIAN.  You were seen today by Dr. Talbert Cage Follow up in 4 weeks with labs  Thank you for choosing [place] at [facility] to provide your oncology and hematology care.  To afford each patient quality time with our provider, please arrive at least 15 minutes before your scheduled appointment time.    If you have a lab appointment with the [facility] please come in thru the  Main Entrance and check in at the main information desk  You need to re-schedule your appointment should you arrive 10 or more minutes late.  We strive to give you quality time with our providers, and arriving late affects you and other patients whose appointments are after yours.  Also, if you no show three or more times for appointments you may be dismissed from the clinic at the providers discretion.     Again, thank you for choosing [facility].  Our hope is that these requests will decrease the amount of time that you wait before being seen by our physicians.       _____________________________________________________________  Should you have questions after your visit to [facility], please contact our office at [phone] between the hours of 8:30 a.m. and 4:30 p.m.  Voicemails left after 4:30 p.m. will not be returned until the following business day.  For prescription refill requests, have your pharmacy contact our office.       Resources For Cancer Patients and their Caregivers ? American Cancer Society: Can assist with transportation, wigs, general needs, runs Look Good Feel Better.        419-623-0516 ? Cancer Care: Provides financial assistance, online support groups, medication/co-pay assistance.  1-800-813-HOPE 651-538-4206) ? [facility] Assists [place]  cancer patients and their families through emotional , educational and financial support.  551-868-6059 ? [place] DSS Where to apply for food stamps, Medicaid and utility assistance. 248-457-0611 ? RCATS: Transportation to medical appointments. 856-280-4551 ? Social Security Administration: May apply for disability if have a Stage IV cancer. 5138153834 315-176-6387 ? [employer], Disability and Transit Services: Assists with nutrition, care and transit needs. [place] Support Programs: @10RELATIVEDAYS @ > Cancer Support Group  2nd Tuesday of the month 1pm-2pm, Journey Room  > Creative Journey  3rd Tuesday of the month 1130am-1pm, Journey Room  > Look Good Feel Better  1st Wednesday of the month 10am-12 noon, Journey Room (Call [place] to register [phone])

## 2016-08-11 LAB — PROTEIN ELECTROPHORESIS, SERUM
A/G RATIO SPE: 1.1 (ref 0.7–1.7)
ALBUMIN ELP: 3.1 g/dL (ref 2.9–4.4)
Alpha-1-Globulin: 0.3 g/dL (ref 0.0–0.4)
Alpha-2-Globulin: 0.8 g/dL (ref 0.4–1.0)
Beta Globulin: 0.9 g/dL (ref 0.7–1.3)
GAMMA GLOBULIN: 1 g/dL (ref 0.4–1.8)
Globulin, Total: 2.9 g/dL (ref 2.2–3.9)
TOTAL PROTEIN ELP: 6 g/dL (ref 6.0–8.5)

## 2016-08-11 LAB — KAPPA/LAMBDA LIGHT CHAINS
KAPPA, LAMDA LIGHT CHAIN RATIO: 1.59 (ref 0.26–1.65)
Kappa free light chain: 32.5 mg/L — ABNORMAL HIGH (ref 3.3–19.4)
Lambda free light chains: 20.5 mg/L (ref 5.7–26.3)

## 2016-08-14 LAB — IMMUNOFIXATION ELECTROPHORESIS
IGA: 172 mg/dL (ref 64–422)
IGG (IMMUNOGLOBIN G), SERUM: 901 mg/dL (ref 700–1600)
IgM, Serum: 137 mg/dL (ref 26–217)
Total Protein ELP: 6 g/dL (ref 6.0–8.5)

## 2016-08-15 ENCOUNTER — Other Ambulatory Visit (HOSPITAL_COMMUNITY)
Admission: RE | Admit: 2016-08-15 | Discharge: 2016-08-15 | Disposition: A | Discharge status: Home / Self Care | Payer: Medicare Other | Source: Ambulatory Visit | Attending: Oncology | Admitting: Oncology

## 2016-08-15 ENCOUNTER — Other Ambulatory Visit (HOSPITAL_COMMUNITY): Payer: Self-pay | Admitting: *Deleted

## 2016-08-15 ENCOUNTER — Other Ambulatory Visit (HOSPITAL_COMMUNITY): Payer: Self-pay | Admitting: Adult Health

## 2016-08-15 DIAGNOSIS — C9000 Multiple myeloma not having achieved remission: Secondary | ICD-10-CM

## 2016-08-17 LAB — UPEP/TP, 24-HR URINE
ALBUMIN, U: 16.9 %
ALPHA 1 UR: 4.3 %
Alpha 2, Urine: 17 %
Beta, Urine: 37.2 %
GAMMA UR: 24.6 %
TOTAL PROTEIN, URINE-UPE24: 21 mg/dL
TOTAL PROTEIN, URINE-UR/DAY: 227 mg/(24.h) — AB (ref 30–150)
Total Volume: 1080

## 2016-08-17 LAB — UIFE/LIGHT CHAINS/TP QN, 24-HR UR
% BETA, URINE: 39.7 %
ALBUMIN, U: 16.8 %
ALPHA 1 URINE: 5.2 %
Alpha 2, Urine: 19.4 %
Free Kappa/Lambda Ratio: 26.34 — ABNORMAL HIGH (ref 2.04–10.37)
Free Lambda Lt Chains,Ur: 5.58 mg/L (ref 0.24–6.66)
Free Lt Chn Excr Rate: 147 mg/L — ABNORMAL HIGH (ref 1.35–24.19)
GAMMA GLOBULIN URINE: 18.9 %
TOTAL PROTEIN, URINE-UPE24: 21.7 mg/dL
TOTAL PROTEIN, URINE-UR/DAY: 234 mg/(24.h) — AB (ref 30–150)
Total Volume: 1080

## 2016-08-17 LAB — IMMUNOFIXATION, URINE

## 2016-08-24 ENCOUNTER — Encounter (HOSPITAL_BASED_OUTPATIENT_CLINIC_OR_DEPARTMENT_OTHER): Payer: Medicare Other

## 2016-08-24 ENCOUNTER — Encounter (HOSPITAL_COMMUNITY): Payer: Self-pay

## 2016-08-24 ENCOUNTER — Encounter (HOSPITAL_COMMUNITY): Payer: Medicare Other

## 2016-08-24 VITALS — BP 119/46 | HR 62 | Temp 97.7°F | Resp 18

## 2016-08-24 DIAGNOSIS — D631 Anemia in chronic kidney disease: Secondary | ICD-10-CM

## 2016-08-24 DIAGNOSIS — D801 Nonfamilial hypogammaglobulinemia: Secondary | ICD-10-CM

## 2016-08-24 DIAGNOSIS — N183 Chronic kidney disease, stage 3 (moderate): Secondary | ICD-10-CM

## 2016-08-24 DIAGNOSIS — C9000 Multiple myeloma not having achieved remission: Secondary | ICD-10-CM

## 2016-08-24 LAB — BASIC METABOLIC PANEL
Anion gap: 7 (ref 5–15)
BUN: 26 mg/dL — AB (ref 6–20)
CO2: 27 mmol/L (ref 22–32)
CREATININE: 1.41 mg/dL — AB (ref 0.44–1.00)
Calcium: 9.2 mg/dL (ref 8.9–10.3)
Chloride: 103 mmol/L (ref 101–111)
GFR calc non Af Amer: 35 mL/min — ABNORMAL LOW (ref >60)
GFR, EST AFRICAN AMERICAN: 41 mL/min — AB (ref >60)
Glucose, Bld: 95 mg/dL (ref 65–99)
Potassium: 3.7 mmol/L (ref 3.5–5.1)
SODIUM: 137 mmol/L (ref 135–145)

## 2016-08-24 LAB — CBC WITH DIFFERENTIAL/PLATELET
Basophils Absolute: 0 10*3/uL (ref 0.0–0.1)
Basophils Relative: 0 %
Eosinophils Absolute: 0 10*3/uL (ref 0.0–0.7)
Eosinophils Relative: 1 %
HCT: 28.4 % — ABNORMAL LOW (ref 36.0–46.0)
Hemoglobin: 9.5 g/dL — ABNORMAL LOW (ref 12.0–15.0)
LYMPHS PCT: 14 %
Lymphs Abs: 0.8 10*3/uL (ref 0.7–4.0)
MCH: 30.7 pg (ref 26.0–34.0)
MCHC: 33.5 g/dL (ref 30.0–36.0)
MCV: 91.9 fL (ref 78.0–100.0)
MONO ABS: 0.9 10*3/uL (ref 0.1–1.0)
MONOS PCT: 16 %
NEUTROS ABS: 3.9 10*3/uL (ref 1.7–7.7)
Neutrophils Relative %: 69 %
Platelets: 184 10*3/uL (ref 150–400)
RBC: 3.09 MIL/uL — ABNORMAL LOW (ref 3.87–5.11)
RDW: 14.5 % (ref 11.5–15.5)
WBC: 5.6 10*3/uL (ref 4.0–10.5)

## 2016-08-24 MED ORDER — DARBEPOETIN ALFA 200 MCG/0.4ML IJ SOSY
PREFILLED_SYRINGE | INTRAMUSCULAR | Status: AC
  Filled 2016-08-24: qty 0.4

## 2016-08-24 MED ORDER — DARBEPOETIN ALFA 200 MCG/0.4ML IJ SOSY
200.0000 ug | PREFILLED_SYRINGE | Freq: Once | INTRAMUSCULAR | Status: AC
  Administered 2016-08-24: 200 ug via SUBCUTANEOUS

## 2016-08-24 NOTE — Progress Notes (Signed)
Carol Bradley presents today for injection per the provider's orders.  Aranesp administration without incident; see MAR for injection details.  Patient tolerated procedure well and without incident.  No questions or complaints noted at this time. 

## 2016-08-24 NOTE — Patient Instructions (Signed)
[facility] at [facility] Discharge Instructions  RECOMMENDATIONS MADE BY THE CONSULTANT AND ANY TEST RESULTS WILL BE SENT TO YOUR REFERRING PHYSICIAN.  Aranesp injection today. Return as scheduled.   Thank you for choosing [facility] at [facility] to provide your oncology and hematology care.  To afford each patient quality time with our provider, please arrive at least 15 minutes before your scheduled appointment time.    If you have a lab appointment with the Cancer Center please come in thru the  Main Entrance and check in at the main information desk  You need to re-schedule your appointment should you arrive 10 or more minutes late.  We strive to give you quality time with our providers, and arriving late affects you and other patients whose appointments are after yours.  Also, if you no show three or more times for appointments you may be dismissed from the clinic at the providers discretion.     Again, thank you for choosing [facility].  Our hope is that these requests will decrease the amount of time that you wait before being seen by our physicians.       _____________________________________________________________  Should you have questions after your visit to [facility], please contact our office at [phone] between the hours of 8:30 a.m. and 4:30 p.m.  Voicemails left after 4:30 p.m. will not be returned until the following business day.  For prescription refill requests, have your pharmacy contact our office.       Resources For Cancer Patients and their Caregivers ? American Cancer Society: Can assist with transportation, wigs, general needs, runs Look Good Feel Better.        1-888-227-6333 ? Cancer Care: Provides financial assistance, online support groups, medication/co-pay assistance.  1-800-813-HOPE (4673) ? [facility] Assists [place] cancer patients and  their families through emotional , educational and financial support.  336-427-4357 ? [place] DSS Where to apply for food stamps, Medicaid and utility assistance. 336-342-1394 ? RCATS: Transportation to medical appointments. 336-347-2287 ? Social Security Administration: May apply for disability if have a Stage IV cancer. 336-342-7796 1-800-772-1213 ? [place] Aging, Disability and Transit Services: Assists with nutrition, care and transit needs. 336-349-2343  Cancer Center Support Programs: @10RELATIVEDAYS@ > Cancer Support Group  2nd Tuesday of the month 1pm-2pm, Journey Room  > Creative Journey  3rd Tuesday of the month 1130am-1pm, Journey Room  > Look Good Feel Better  1st Wednesday of the month 10am-12 noon, Journey Room (Call American Cancer Society to register 1-800-395-5775)   

## 2016-08-25 ENCOUNTER — Other Ambulatory Visit: Payer: Self-pay | Admitting: Cardiology

## 2016-09-04 ENCOUNTER — Other Ambulatory Visit: Payer: Self-pay | Admitting: Cardiology

## 2016-09-07 ENCOUNTER — Encounter (HOSPITAL_COMMUNITY): Payer: Self-pay | Admitting: Oncology

## 2016-09-07 ENCOUNTER — Encounter (HOSPITAL_COMMUNITY): Payer: Medicare Other

## 2016-09-07 ENCOUNTER — Encounter (HOSPITAL_COMMUNITY): Payer: Medicare Other | Attending: Oncology | Admitting: Oncology

## 2016-09-07 ENCOUNTER — Encounter (HOSPITAL_BASED_OUTPATIENT_CLINIC_OR_DEPARTMENT_OTHER): Payer: Medicare Other

## 2016-09-07 VITALS — BP 137/61 | HR 77 | Temp 97.5°F | Resp 16

## 2016-09-07 VITALS — BP 159/70 | HR 75 | Resp 16 | Wt 149.2 lb

## 2016-09-07 DIAGNOSIS — C9000 Multiple myeloma not having achieved remission: Secondary | ICD-10-CM | POA: Insufficient documentation

## 2016-09-07 DIAGNOSIS — I5030 Unspecified diastolic (congestive) heart failure: Secondary | ICD-10-CM | POA: Insufficient documentation

## 2016-09-07 DIAGNOSIS — D631 Anemia in chronic kidney disease: Secondary | ICD-10-CM

## 2016-09-07 DIAGNOSIS — Z79899 Other long term (current) drug therapy: Secondary | ICD-10-CM | POA: Insufficient documentation

## 2016-09-07 DIAGNOSIS — N183 Chronic kidney disease, stage 3 unspecified: Secondary | ICD-10-CM

## 2016-09-07 DIAGNOSIS — Z87891 Personal history of nicotine dependence: Secondary | ICD-10-CM | POA: Diagnosis not present

## 2016-09-07 DIAGNOSIS — I130 Hypertensive heart and chronic kidney disease with heart failure and stage 1 through stage 4 chronic kidney disease, or unspecified chronic kidney disease: Secondary | ICD-10-CM | POA: Insufficient documentation

## 2016-09-07 DIAGNOSIS — J449 Chronic obstructive pulmonary disease, unspecified: Secondary | ICD-10-CM | POA: Insufficient documentation

## 2016-09-07 DIAGNOSIS — R5383 Other fatigue: Secondary | ICD-10-CM | POA: Diagnosis not present

## 2016-09-07 DIAGNOSIS — D649 Anemia, unspecified: Secondary | ICD-10-CM | POA: Insufficient documentation

## 2016-09-07 DIAGNOSIS — E538 Deficiency of other specified B group vitamins: Secondary | ICD-10-CM | POA: Insufficient documentation

## 2016-09-07 DIAGNOSIS — Z7982 Long term (current) use of aspirin: Secondary | ICD-10-CM | POA: Insufficient documentation

## 2016-09-07 DIAGNOSIS — D801 Nonfamilial hypogammaglobulinemia: Secondary | ICD-10-CM | POA: Diagnosis present

## 2016-09-07 DIAGNOSIS — R159 Full incontinence of feces: Secondary | ICD-10-CM | POA: Diagnosis not present

## 2016-09-07 LAB — CBC WITH DIFFERENTIAL/PLATELET
BASOS ABS: 0 10*3/uL (ref 0.0–0.1)
Basophils Relative: 0 %
EOS ABS: 0.1 10*3/uL (ref 0.0–0.7)
EOS PCT: 1 %
HCT: 30.6 % — ABNORMAL LOW (ref 36.0–46.0)
HEMOGLOBIN: 10.1 g/dL — AB (ref 12.0–15.0)
LYMPHS PCT: 13 %
Lymphs Abs: 1 10*3/uL (ref 0.7–4.0)
MCH: 30.3 pg (ref 26.0–34.0)
MCHC: 33 g/dL (ref 30.0–36.0)
MCV: 91.9 fL (ref 78.0–100.0)
Monocytes Absolute: 0.8 10*3/uL (ref 0.1–1.0)
Monocytes Relative: 11 %
Neutro Abs: 5.5 10*3/uL (ref 1.7–7.7)
Neutrophils Relative %: 75 %
PLATELETS: 217 10*3/uL (ref 150–400)
RBC: 3.33 MIL/uL — AB (ref 3.87–5.11)
RDW: 14.2 % (ref 11.5–15.5)
WBC: 7.4 10*3/uL (ref 4.0–10.5)

## 2016-09-07 LAB — COMPREHENSIVE METABOLIC PANEL
ALT: 15 U/L (ref 14–54)
AST: 27 U/L (ref 15–41)
Albumin: 3.3 g/dL — ABNORMAL LOW (ref 3.5–5.0)
Alkaline Phosphatase: 60 U/L (ref 38–126)
Anion gap: 8 (ref 5–15)
BUN: 28 mg/dL — AB (ref 6–20)
CHLORIDE: 103 mmol/L (ref 101–111)
CO2: 28 mmol/L (ref 22–32)
CREATININE: 1.57 mg/dL — AB (ref 0.44–1.00)
Calcium: 8.9 mg/dL (ref 8.9–10.3)
GFR calc Af Amer: 36 mL/min — ABNORMAL LOW (ref >60)
GFR calc non Af Amer: 31 mL/min — ABNORMAL LOW (ref >60)
Glucose, Bld: 95 mg/dL (ref 65–99)
Potassium: 3.9 mmol/L (ref 3.5–5.1)
SODIUM: 139 mmol/L (ref 135–145)
Total Bilirubin: 0.4 mg/dL (ref 0.3–1.2)
Total Protein: 6.7 g/dL (ref 6.5–8.1)

## 2016-09-07 MED ORDER — IMMUNE GLOBULIN (HUMAN) 10 GM/100ML IV SOLN
70.0000 g | Freq: Once | INTRAVENOUS | Status: AC
  Administered 2016-09-07: 70 g via INTRAVENOUS
  Filled 2016-09-07: qty 600

## 2016-09-07 MED ORDER — CYANOCOBALAMIN 1000 MCG/ML IJ SOLN
1000.0000 ug | Freq: Once | INTRAMUSCULAR | Status: AC
  Administered 2016-09-07: 1000 ug via INTRAMUSCULAR
  Filled 2016-09-07: qty 1

## 2016-09-07 MED ORDER — ZOLEDRONIC ACID 4 MG/5ML IV CONC
3.0000 mg | Freq: Once | INTRAVENOUS | Status: AC
  Administered 2016-09-07: 3 mg via INTRAVENOUS
  Filled 2016-09-07: qty 3.75

## 2016-09-07 MED ORDER — DARBEPOETIN ALFA 200 MCG/0.4ML IJ SOSY
PREFILLED_SYRINGE | INTRAMUSCULAR | Status: AC
  Filled 2016-09-07: qty 0.4

## 2016-09-07 MED ORDER — DARBEPOETIN ALFA 200 MCG/0.4ML IJ SOSY
200.0000 ug | PREFILLED_SYRINGE | Freq: Once | INTRAMUSCULAR | Status: AC
  Administered 2016-09-07: 200 ug via SUBCUTANEOUS

## 2016-09-07 MED ORDER — ACETAMINOPHEN 325 MG PO TABS
650.0000 mg | ORAL_TABLET | Freq: Four times a day (QID) | ORAL | Status: DC | PRN
  Administered 2016-09-07: 650 mg via ORAL
  Filled 2016-09-07: qty 2

## 2016-09-07 MED ORDER — DIPHENHYDRAMINE HCL 25 MG PO TABS
25.0000 mg | ORAL_TABLET | ORAL | Status: AC | PRN
  Administered 2016-09-07: 25 mg via ORAL
  Filled 2016-09-07 (×2): qty 1

## 2016-09-07 MED ORDER — DEXTROSE 5 % IV SOLN
INTRAVENOUS | Status: DC
  Administered 2016-09-07: 10:00:00 via INTRAVENOUS

## 2016-09-07 NOTE — Assessment & Plan Note (Addendum)
Anemia of chronic renal disease, Stage III.  On ESA therapy, Aranesp, every 2 weeks. Will maintain appropriate iron saturation and ferritin level with IV iron replacement therapy when indicated.  Labs today: CBC diff, iron/TIBC, ferritin  Labs every 2 weeks: CBC.  Iron studies every 90 days: iron/TIBC, ferritin.  Continue with ESA therapy to a HGB of 11 g/dL for chronic renal disease.  Will adjust dosing accordingly.  Supportive therapy plan re-built for renal dosing of Aranesp since she is no longer on chemotherapy.

## 2016-09-07 NOTE — Progress Notes (Signed)
Tolerated infusion w/o adverse reaction.  Alert, in no distress.  VSS.  Discharged ambulatory in c/o friend.  

## 2016-09-07 NOTE — Assessment & Plan Note (Signed)
Continue with B12 replacement monthly

## 2016-09-07 NOTE — Patient Instructions (Signed)
[place] at [facility] Discharge Instructions  RECOMMENDATIONS MADE BY THE CONSULTANT AND ANY TEST RESULTS WILL BE SENT TO YOUR REFERRING PHYSICIAN.  You were seen today by Manning Charity Continue Aranesp injections every 2 weeks with labs Continue B-12 injections, Zometa and IVIG every month with labs Follow up in 2 months See Amy up front for appointments   Thank you for choosing [place] at [facility] to provide your oncology and hematology care.  To afford each patient quality time with our provider, please arrive at least 15 minutes before your scheduled appointment time.    If you have a lab appointment with the [place] please come in thru the  Main Entrance and check in at the main information desk  You need to re-schedule your appointment should you arrive 10 or more minutes late.  We strive to give you quality time with our providers, and arriving late affects you and other patients whose appointments are after yours.  Also, if you no show three or more times for appointments you may be dismissed from the clinic at the providers discretion.     Again, thank you for choosing [facility].  Our hope is that these requests will decrease the amount of time that you wait before being seen by our physicians.       _____________________________________________________________  Should you have questions after your visit to [facility], please contact our office at [phone] between the hours of 8:30 a.m. and 4:30 p.m.  Voicemails left after 4:30 p.m. will not be returned until the following business day.  For prescription refill requests, have your pharmacy contact our office.       Resources For Cancer Patients and their Caregivers ? American Cancer Society: Can assist with transportation, wigs, general needs, runs Look Good Feel Better.        775-149-2477 ? Cancer Care: Provides financial  assistance, online support groups, medication/co-pay assistance.  1-800-813-HOPE 765-063-5427) ? [facility] Assists [place] cancer patients and their families through emotional , educational and financial support.  705-692-4731 ? [place] DSS Where to apply for food stamps, Medicaid and utility assistance. 913-754-7790 ? RCATS: Transportation to medical appointments. 939-266-0783 ? Social Security Administration: May apply for disability if have a Stage IV cancer. (910) 550-0299 262-065-1635 ? [employer], Disability and Transit Services: Assists with nutrition, care and transit needs. [place] Support Programs: @10RELATIVEDAYS @ > Cancer Support Group  2nd Tuesday of the month 1pm-2pm, Journey Room  > Creative Journey  3rd Tuesday of the month 1130am-1pm, Journey Room  > Look Good Feel Better  1st Wednesday of the month 10am-12 noon, Journey Room (Call [place] to register [phone])

## 2016-09-07 NOTE — Patient Instructions (Signed)
[place] at [facility] Discharge Instructions  RECOMMENDATIONS MADE BY THE CONSULTANT AND ANY TEST RESULTS WILL BE SENT TO YOUR REFERRING PHYSICIAN.  IVIG infusion today. Zometa infusion today.  B12 injection today. Aranesp injection today. Return as scheduled.   Thank you for choosing [place] at [facility] to provide your oncology and hematology care.  To afford each patient quality time with our provider, please arrive at least 15 minutes before your scheduled appointment time.    If you have a lab appointment with the [facility] please come in thru the  Main Entrance and check in at the main information desk  You need to re-schedule your appointment should you arrive 10 or more minutes late.  We strive to give you quality time with our providers, and arriving late affects you and other patients whose appointments are after yours.  Also, if you no show three or more times for appointments you may be dismissed from the clinic at the providers discretion.     Again, thank you for choosing [facility].  Our hope is that these requests will decrease the amount of time that you wait before being seen by our physicians.       _____________________________________________________________  Should you have questions after your visit to [facility], please contact our office at [phone] between the hours of 8:30 a.m. and 4:30 p.m.  Voicemails left after 4:30 p.m. will not be returned until the following business day.  For prescription refill requests, have your pharmacy contact our office.       Resources For Cancer Patients and their Caregivers ? American Cancer Society: Can assist with transportation, wigs, general needs, runs Look Good Feel Better.        ([phone] ? Cancer Care: Provides financial assistance, online support groups, medication/co-pay assistance.  1-800-813-HOPE 613-054-0745) ? [place] Assists [place] cancer patients and their families through emotional , educational and financial support.  778-396-5890 ? [place] DSS Where to apply for food stamps, Medicaid and utility assistance. 828-369-7237 ? RCATS: Transportation to medical appointments. ([phone] [phone] ? Social Security Administration: May apply for disability if have a Stage IV cancer. ([phone] 321-840-2453 ? [employer], Disability and Transit Services: Assists with nutrition, care and transit needs. [place] Support Programs: @10RELATIVEDAYS @ > Cancer Support Group  2nd Tuesday of the month 1pm-2pm, Journey Room  > Creative Journey  3rd Tuesday of the month 1130am-1pm, Journey Room  > Look Good Feel Better  1st Wednesday of the month 10am-12 noon, Journey Room (Call [place] to register [phone])

## 2016-09-07 NOTE — Assessment & Plan Note (Signed)
Hypogammamglobulinemia, on IVIG monthly.

## 2016-09-07 NOTE — Assessment & Plan Note (Addendum)
IgG kappa Myeloma, in remission with intolerance to Revlimid/Dexamethasone after taking for many months.  She remains on Zometa monthly at a reduced dose due to renal function.   Labs today: CBC diff, CMET, SPEP+IFE, Light chain assay, and B2M.  I personally reviewed and went over laboratory results with the patient.  The results are noted within this dictation.  Labs in 1 month and 2 months: CBC diff, CMET, SPEP+IFE, light chain assay, and B2M.  She is due for Zometa today.  Return in 2 months for follow-up.

## 2016-09-07 NOTE — Progress Notes (Signed)
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  INTERVAL HISTORY: Carol Bradley 76 y.o. female returns for followup of IgG kappa Myeloma, in remission with intolerance to Revlimid/Dexamethasone after taking for many months and declining QOL. AND Hypogammaglobulinemia, on IVIG monthly. AND Anemia in chronic renal disease, Stage III.  On ESA therapy, Aranesp, every 2 weeks.    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       03/18/2015 - 07/28/2015 Chemotherapy    Velcade 1.6 mg/m2 discontinued secondary to intolerance      07/28/2015 Adverse Reaction    stool incontinence, weakness, collapse upon standing, felt to be secondary to velcade      11/09/2015 - 12/13/2015 Chemotherapy    cytoxan IV 300 mg/m2 administered X 2 doses only in addition to rev/dex      11/09/2015 - 06/08/2016 Chemotherapy    Revlimid/Dexamethasone       She feels extremely well now she is off of her therapy for multiple myeloma.  She notes that recently she stopped using her cane because she feels more stable on her feet.  She denies any falls.  Her energy level and stamina have significantly improved.  She notes abnormal ecchymoses on the distal aspects of her lower extremities.  I do not suspect any platelet disorder.  Her platelet count is reasonable today.  Review of Systems  Constitutional: Negative.  Negative for chills, fever and weight loss.  HENT: Negative.   Eyes: Negative.   Respiratory: Negative.   Negative for cough.   Cardiovascular: Negative.  Negative for chest pain.  Gastrointestinal: Negative.  Negative for blood in stool, constipation, diarrhea, melena, nausea and vomiting.  Genitourinary: Negative.   Musculoskeletal: Negative.   Skin: Negative.   Neurological: Negative.  Negative for weakness.  Endo/Heme/Allergies: Bruises/bleeds easily.  Psychiatric/Behavioral: Negative.     Past Medical History:  Diagnosis Date  . 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 CAD (coronary artery disease)   . COPD (chronic obstructive pulmonary disease) ([place])   . History of tobacco abuse   . Hyperlipidemia   . Hypertension   . Hypogammaglobulinemia ([place]) 01/15/2016  . Multiple myeloma ([place])   . Vitamin B12 deficiency 04/15/2016   Overview:  Vitamin B12 level documented 155, January 2016 with the normal range being 211-924    Past Surgical History:  Procedure Laterality Date  . BACK SURGERY    . CARDIAC CATHETERIZATION  04/2011   right and left cath showing normal right heart pressures,but newly diagnosed coronary artery disease/drug eluting stent placed to RCA with residual disease in the proximal RCA and LAD and ramus, normal LV function and 60-65% EF  . COLONOSCOPY N/A 09/30/2014   Procedure: COLONOSCOPY;  Surgeon: Rogene Houston, MD;  Location: [facility] ENDO SUITE;  Service: Endoscopy;  Laterality: N/A;  225  . CORONARY ANGIOPLASTY WITH STENT PLACEMENT  04/2011   mid RCA: 3.0 X38 mm Promus DES. Residual 40% disease proximally  . NECK SURGERY  Family History  Problem Relation Age of Onset  . Heart failure Mother   . Cancer Father     Social History   Social History  . Marital status: Married    Spouse name: N/A  . Number of children: N/A  . Years of education: N/A   Occupational History  . 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 Smoking status: Former Smoker    Packs/day: 3.00    Years: 25.00    Types: Cigarettes     Quit date: 07/10/1994  . Smokeless tobacco: Never Used  . Alcohol use No  . Drug use: Unknown  . Sexual activity: Not Asked   Other Topics Concern  . None   Social History Narrative  . None     PHYSICAL EXAMINATION  ECOG PERFORMANCE STATUS: 1 - Symptomatic but completely ambulatory  Vitals:   09/07/16 0922  BP: (!) 159/70  Pulse: 75  Resp: 16    GENERAL:alert, no distress, well nourished, well developed, comfortable, cooperative, smiling and in chemo recliner, companied by friend. SKIN: skin color, texture, turgor are normal, no rashes or significant lesions HEAD: Normocephalic, No masses, lesions, tenderness or abnormalities, Atopic facies- moon facies EYES: normal, Conjunctiva are pink and non-injected EARS: External ears normal OROPHARYNX:lips, buccal mucosa, and tongue normal and mucous membranes are moist  NECK: supple, trachea midline LYMPH:  no palpable lymphadenopathy BREAST:not examined LUNGS: clear to auscultation  HEART: regular rate & rhythm ABDOMEN:abdomen soft and normal bowel sounds BACK: Back symmetric, no curvature. EXTREMITIES:less then 2 second capillary refill, no joint deformities, effusion, or inflammation, no skin discoloration, no cyanosis  NEURO: alert & oriented x 3 with fluent speech, no focal motor/sensory deficits, gait normal   LABORATORY DATA: CBC    Component Value Date/Time   WBC 7.4 09/07/2016 0827   RBC 3.33 (L) 09/07/2016 0827   HGB 10.1 (L) 09/07/2016 0827   HCT 30.6 (L) 09/07/2016 0827   PLT 217 09/07/2016 0827   MCV 91.9 09/07/2016 0827   MCH 30.3 09/07/2016 0827   MCHC 33.0 09/07/2016 0827   RDW 14.2 09/07/2016 0827   LYMPHSABS 1.0 09/07/2016 0827   MONOABS 0.8 09/07/2016 0827   EOSABS 0.1 09/07/2016 0827   BASOSABS 0.0 09/07/2016 0827      Chemistry      Component Value Date/Time   NA 139 09/07/2016 0827   K 3.9 09/07/2016 0827   CL 103 09/07/2016 0827   CO2 28 09/07/2016 0827   BUN 28 (H) 09/07/2016 0827    CREATININE 1.57 (H) 09/07/2016 0827      Component Value Date/Time   CALCIUM 8.9 09/07/2016 0827   ALKPHOS 60 09/07/2016 0827   AST 27 09/07/2016 0827   ALT 15 09/07/2016 0827   BILITOT 0.4 09/07/2016 0827      Lab Results  Component Value Date   PROT 6.7 09/07/2016   ALBUMINELP 3.1 08/10/2016   A1GS 0.3 08/10/2016   A2GS 0.8 08/10/2016   BETS 0.9 08/10/2016   GAMS 1.0 08/10/2016   MSPIKE Not Observed 08/10/2016   SPEI Comment 08/10/2016   SPECOM Comment 08/10/2016   IGGSERUM 901 08/10/2016   IGA 172 08/10/2016   IGMSERUM 137 08/10/2016   KPAFRELGTCHN 32.5 (H) 08/10/2016   LAMBDASER 20.5 08/10/2016   KAPLAMBRATIO 26.34 (H) 08/15/2016    PENDING LABS:   RADIOGRAPHIC STUDIES:  No results found.   PATHOLOGY:    ASSESSMENT AND PLAN:  Multiple myeloma (HCC) IgG  kappa Myeloma, in remission with intolerance to Revlimid/Dexamethasone after taking for many months.  She remains on Zometa monthly at a reduced dose due to renal function.   Labs today: CBC diff, CMET, SPEP+IFE, Light chain assay, and B2M.  I personally reviewed and went over laboratory results with the patient.  The results are noted within this dictation.  Labs in 1 month and 2 months: CBC diff, CMET, SPEP+IFE, light chain assay, and B2M.  She is due for Zometa today.  Return in 2 months for follow-up.   Hypogammaglobulinemia (HCC) Hypogammamglobulinemia, on IVIG monthly.  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  On ESA therapy, Aranesp, every 2 weeks. Will maintain appropriate iron saturation and ferritin level with IV iron replacement therapy when indicated.  Labs today: CBC diff, iron/TIBC, ferritin  Labs every 2 weeks: CBC.  Iron studies every 90 days: iron/TIBC, ferritin.  Continue with ESA therapy to a HGB of 11 g/dL for chronic renal disease.  Will adjust dosing  accordingly.  Supportive therapy plan re-built for renal dosing of Aranesp since she is no longer on chemotherapy.   ORDERS PLACED FOR THIS ENCOUNTER: Orders Placed This Encounter  Procedures  . Iron and TIBC  . Ferritin  . Iron and TIBC  . Ferritin    MEDICATIONS PRESCRIBED THIS ENCOUNTER: No orders of the defined types were placed in this encounter.   THERAPY PLAN:  Aranesp every 2 weeks, Zometa 3 mg monthly, B12 injection monthly, and IVIG monthly.  All questions were answered. The patient knows to call the clinic with any problems, questions or concerns. We can certainly see the patient much sooner if necessary.  Patient and plan discussed with Dr. Twana First and she is in agreement with the aforementioned.   This note is electronically signed by: Doy Mince 09/07/2016 10:34 AM

## 2016-09-08 LAB — IGG, IGA, IGM
IGA: 163 mg/dL (ref 64–422)
IgG (Immunoglobin G), Serum: 1038 mg/dL (ref 700–1600)
IgM, Serum: 186 mg/dL (ref 26–217)

## 2016-09-08 LAB — PROTEIN ELECTROPHORESIS, SERUM
A/G RATIO SPE: 1 (ref 0.7–1.7)
ALBUMIN ELP: 3.1 g/dL (ref 2.9–4.4)
ALPHA-2-GLOBULIN: 0.8 g/dL (ref 0.4–1.0)
Alpha-1-Globulin: 0.2 g/dL (ref 0.0–0.4)
BETA GLOBULIN: 0.9 g/dL (ref 0.7–1.3)
GAMMA GLOBULIN: 1.2 g/dL (ref 0.4–1.8)
Globulin, Total: 3.1 g/dL (ref 2.2–3.9)
Total Protein ELP: 6.2 g/dL (ref 6.0–8.5)

## 2016-09-08 LAB — KAPPA/LAMBDA LIGHT CHAINS
KAPPA FREE LGHT CHN: 31.8 mg/L — AB (ref 3.3–19.4)
KAPPA, LAMDA LIGHT CHAIN RATIO: 1.65 (ref 0.26–1.65)
LAMDA FREE LIGHT CHAINS: 19.3 mg/L (ref 5.7–26.3)

## 2016-09-08 LAB — BETA 2 MICROGLOBULIN, SERUM: Beta-2 Microglobulin: 3.2 mg/L — ABNORMAL HIGH (ref 0.6–2.4)

## 2016-09-21 ENCOUNTER — Encounter (HOSPITAL_COMMUNITY): Payer: Medicare Other

## 2016-09-21 ENCOUNTER — Encounter (HOSPITAL_COMMUNITY): Payer: Self-pay

## 2016-09-21 ENCOUNTER — Encounter (HOSPITAL_BASED_OUTPATIENT_CLINIC_OR_DEPARTMENT_OTHER): Payer: Medicare Other

## 2016-09-21 VITALS — BP 115/60 | HR 80 | Temp 97.9°F | Resp 18

## 2016-09-21 DIAGNOSIS — N183 Chronic kidney disease, stage 3 unspecified: Secondary | ICD-10-CM

## 2016-09-21 DIAGNOSIS — C9000 Multiple myeloma not having achieved remission: Secondary | ICD-10-CM

## 2016-09-21 DIAGNOSIS — D631 Anemia in chronic kidney disease: Secondary | ICD-10-CM

## 2016-09-21 DIAGNOSIS — D801 Nonfamilial hypogammaglobulinemia: Secondary | ICD-10-CM

## 2016-09-21 DIAGNOSIS — E538 Deficiency of other specified B group vitamins: Secondary | ICD-10-CM

## 2016-09-21 LAB — CBC
HCT: 29.1 % — ABNORMAL LOW (ref 36.0–46.0)
HEMOGLOBIN: 9.5 g/dL — AB (ref 12.0–15.0)
MCH: 29.6 pg (ref 26.0–34.0)
MCHC: 32.6 g/dL (ref 30.0–36.0)
MCV: 90.7 fL (ref 78.0–100.0)
Platelets: 203 10*3/uL (ref 150–400)
RBC: 3.21 MIL/uL — AB (ref 3.87–5.11)
RDW: 14.8 % (ref 11.5–15.5)
WBC: 7.6 10*3/uL (ref 4.0–10.5)

## 2016-09-21 MED ORDER — DARBEPOETIN ALFA 60 MCG/0.3ML IJ SOSY
PREFILLED_SYRINGE | INTRAMUSCULAR | Status: AC
  Filled 2016-09-21: qty 0.3

## 2016-09-21 MED ORDER — DARBEPOETIN ALFA 60 MCG/0.3ML IJ SOSY
60.0000 ug | PREFILLED_SYRINGE | Freq: Once | INTRAMUSCULAR | Status: AC
  Administered 2016-09-21: 60 ug via SUBCUTANEOUS

## 2016-09-21 NOTE — Progress Notes (Signed)
Carol Bradley presents today for injection per the provider's orders.  Aranesp administration without incident; see MAR for injection details.  Patient tolerated procedure well and without incident.  No questions or complaints noted at this time.

## 2016-10-05 ENCOUNTER — Ambulatory Visit (HOSPITAL_COMMUNITY): Payer: Medicare Other

## 2016-10-05 ENCOUNTER — Other Ambulatory Visit (HOSPITAL_COMMUNITY): Payer: Medicare Other

## 2016-10-13 ENCOUNTER — Encounter (HOSPITAL_COMMUNITY): Payer: Medicare Other | Attending: Oncology

## 2016-10-13 ENCOUNTER — Encounter (HOSPITAL_BASED_OUTPATIENT_CLINIC_OR_DEPARTMENT_OTHER): Payer: Medicare Other

## 2016-10-13 VITALS — BP 135/51 | HR 77 | Temp 97.5°F | Resp 16 | Wt 151.6 lb

## 2016-10-13 DIAGNOSIS — C9001 Multiple myeloma in remission: Secondary | ICD-10-CM

## 2016-10-13 DIAGNOSIS — D631 Anemia in chronic kidney disease: Secondary | ICD-10-CM | POA: Insufficient documentation

## 2016-10-13 DIAGNOSIS — C9000 Multiple myeloma not having achieved remission: Secondary | ICD-10-CM | POA: Diagnosis present

## 2016-10-13 DIAGNOSIS — R110 Nausea: Secondary | ICD-10-CM

## 2016-10-13 DIAGNOSIS — N183 Chronic kidney disease, stage 3 unspecified: Secondary | ICD-10-CM

## 2016-10-13 DIAGNOSIS — E538 Deficiency of other specified B group vitamins: Secondary | ICD-10-CM | POA: Diagnosis not present

## 2016-10-13 DIAGNOSIS — D801 Nonfamilial hypogammaglobulinemia: Secondary | ICD-10-CM | POA: Diagnosis present

## 2016-10-13 LAB — COMPREHENSIVE METABOLIC PANEL
ALK PHOS: 84 U/L (ref 38–126)
ALT: 14 U/L (ref 14–54)
ANION GAP: 6 (ref 5–15)
AST: 24 U/L (ref 15–41)
Albumin: 3.2 g/dL — ABNORMAL LOW (ref 3.5–5.0)
BILIRUBIN TOTAL: 0.4 mg/dL (ref 0.3–1.2)
BUN: 28 mg/dL — ABNORMAL HIGH (ref 6–20)
CALCIUM: 8.9 mg/dL (ref 8.9–10.3)
CO2: 25 mmol/L (ref 22–32)
CREATININE: 1.49 mg/dL — AB (ref 0.44–1.00)
Chloride: 105 mmol/L (ref 101–111)
GFR, EST AFRICAN AMERICAN: 38 mL/min — AB (ref >60)
GFR, EST NON AFRICAN AMERICAN: 33 mL/min — AB (ref >60)
Glucose, Bld: 105 mg/dL — ABNORMAL HIGH (ref 65–99)
Potassium: 3.6 mmol/L (ref 3.5–5.1)
SODIUM: 136 mmol/L (ref 135–145)
TOTAL PROTEIN: 7.1 g/dL (ref 6.5–8.1)

## 2016-10-13 LAB — CBC WITH DIFFERENTIAL/PLATELET
Basophils Absolute: 0 10*3/uL (ref 0.0–0.1)
Basophils Relative: 0 %
EOS ABS: 0.1 10*3/uL (ref 0.0–0.7)
Eosinophils Relative: 1 %
HEMATOCRIT: 34.3 % — AB (ref 36.0–46.0)
HEMOGLOBIN: 11.1 g/dL — AB (ref 12.0–15.0)
LYMPHS PCT: 11 %
Lymphs Abs: 0.9 10*3/uL (ref 0.7–4.0)
MCH: 29 pg (ref 26.0–34.0)
MCHC: 32.4 g/dL (ref 30.0–36.0)
MCV: 89.6 fL (ref 78.0–100.0)
MONOS PCT: 10 %
Monocytes Absolute: 0.8 10*3/uL (ref 0.1–1.0)
NEUTROS PCT: 78 %
Neutro Abs: 6.5 10*3/uL (ref 1.7–7.7)
Platelets: 218 10*3/uL (ref 150–400)
RBC: 3.83 MIL/uL — ABNORMAL LOW (ref 3.87–5.11)
RDW: 15.9 % — ABNORMAL HIGH (ref 11.5–15.5)
WBC: 8.2 10*3/uL (ref 4.0–10.5)

## 2016-10-13 LAB — LACTATE DEHYDROGENASE: LDH: 172 U/L (ref 98–192)

## 2016-10-13 MED ORDER — IMMUNE GLOBULIN (HUMAN) 10 GM/100ML IV SOLN
1.0000 g/kg | Freq: Once | INTRAVENOUS | Status: AC
  Administered 2016-10-13: 70 g via INTRAVENOUS
  Filled 2016-10-13: qty 100

## 2016-10-13 MED ORDER — PROCHLORPERAZINE MALEATE 10 MG PO TABS
10.0000 mg | ORAL_TABLET | Freq: Once | ORAL | Status: AC
  Administered 2016-10-13: 10 mg via ORAL

## 2016-10-13 MED ORDER — CYANOCOBALAMIN 1000 MCG/ML IJ SOLN
1000.0000 ug | Freq: Once | INTRAMUSCULAR | Status: AC
  Administered 2016-10-13: 1000 ug via INTRAMUSCULAR
  Filled 2016-10-13: qty 1

## 2016-10-13 MED ORDER — ACETAMINOPHEN 325 MG PO TABS
650.0000 mg | ORAL_TABLET | Freq: Four times a day (QID) | ORAL | Status: DC | PRN
  Administered 2016-10-13: 650 mg via ORAL
  Filled 2016-10-13: qty 2

## 2016-10-13 MED ORDER — PROCHLORPERAZINE MALEATE 10 MG PO TABS
ORAL_TABLET | ORAL | Status: AC
  Filled 2016-10-13: qty 1

## 2016-10-13 MED ORDER — DIPHENHYDRAMINE HCL 25 MG PO TABS
25.0000 mg | ORAL_TABLET | ORAL | Status: AC | PRN
  Administered 2016-10-13: 25 mg via ORAL
  Filled 2016-10-13 (×2): qty 1

## 2016-10-13 MED ORDER — ZOLEDRONIC ACID 4 MG/5ML IV CONC
3.0000 mg | Freq: Once | INTRAVENOUS | Status: AC
  Administered 2016-10-13: 3 mg via INTRAVENOUS
  Filled 2016-10-13: qty 3.75

## 2016-10-13 NOTE — Patient Instructions (Signed)
[place] at [facility] Discharge Instructions  RECOMMENDATIONS MADE BY THE CONSULTANT AND ANY TEST RESULTS WILL BE SENT TO YOUR REFERRING PHYSICIAN.  Vitamin B12 1000 mcg injection given as ordered. Zometa infusion given as ordered. IVIG infusion given as ordered. Return as scheduled.  Thank you for choosing [place] at [facility] to provide your oncology and hematology care.  To afford each patient quality time with our provider, please arrive at least 15 minutes before your scheduled appointment time.    If you have a lab appointment with the [facility] please come in thru the  Main Entrance and check in at the main information desk  You need to re-schedule your appointment should you arrive 10 or more minutes late.  We strive to give you quality time with our providers, and arriving late affects you and other patients whose appointments are after yours.  Also, if you no show three or more times for appointments you may be dismissed from the clinic at the providers discretion.     Again, thank you for choosing [facility].  Our hope is that these requests will decrease the amount of time that you wait before being seen by our physicians.       _____________________________________________________________  Should you have questions after your visit to [facility], please contact our office at [phone] between the hours of 8:30 a.m. and 4:30 p.m.  Voicemails left after 4:30 p.m. will not be returned until the following business day.  For prescription refill requests, have your pharmacy contact our office.       Resources For Cancer Patients and their Caregivers ? American Cancer Society: Can assist with transportation, wigs, general needs, runs Look Good Feel Better.        775-814-6413 ? Cancer Care: Provides financial assistance, online support groups, medication/co-pay assistance.   1-800-813-HOPE (775) 258-6180) ? [place] Assists [place] cancer patients and their families through emotional , educational and financial support.  918 599 1070 ? [place] DSS Where to apply for food stamps, Medicaid and utility assistance. 281-411-8942 ? [employer]: Transportation to medical appointments. (307)193-9509 ? Social Security Administration: May apply for disability if have a Stage IV cancer. 727-367-4572 609-658-2248 ? [employer], Disability and Transit Services: Assists with nutrition, care and transit needs. [place] Support Programs: @10RELATIVEDAYS @ > Cancer Support Group  2nd Tuesday of the month 1pm-2pm, Journey Room  > Creative Journey  3rd Tuesday of the month 1130am-1pm, Journey Room  > Look Good Feel Better  1st Wednesday of the month 10am-12 noon, Journey Room (Call [place] to register [phone])

## 2016-10-13 NOTE — Progress Notes (Signed)
Hemoglobin 11.1 today, aranesp not given, treatment parameters not met. Patient reports she does not want to take aranesp anymore. States she does not think it helps maintain her hemoglobin and also she thinks it makes her sick, XH:FSFSELT vision, nausea, rapid heart beat, SOB, etc. She states she does not want to come in 2 weeks for lab/aranesp, she will wait and see how she is in 4 weeks when she comes back and will revisit/discuss issues with MD.  Carol Bradley presents today for injection per MD orders. B12 1000 mcg administered IM in left Upper Arm. Administration without incident. Patient tolerated well.  Tolerated zometa infusion well.  Tolerated IVIG infusion well. Stable and ambulatory on discharge home with family.

## 2016-10-14 LAB — IGG, IGA, IGM
IGG (IMMUNOGLOBIN G), SERUM: 1065 mg/dL (ref 700–1600)
IgA: 183 mg/dL (ref 64–422)
IgM, Serum: 181 mg/dL (ref 26–217)

## 2016-10-14 LAB — BETA 2 MICROGLOBULIN, SERUM: Beta-2 Microglobulin: 3.9 mg/L — ABNORMAL HIGH (ref 0.6–2.4)

## 2016-10-16 ENCOUNTER — Other Ambulatory Visit: Payer: Self-pay | Admitting: Cardiology

## 2016-10-16 LAB — PROTEIN ELECTROPHORESIS, SERUM
A/G Ratio: 0.9 (ref 0.7–1.7)
Albumin ELP: 2.9 g/dL (ref 2.9–4.4)
Alpha-1-Globulin: 0.3 g/dL (ref 0.0–0.4)
Alpha-2-Globulin: 1 g/dL (ref 0.4–1.0)
Beta Globulin: 0.9 g/dL (ref 0.7–1.3)
GLOBULIN, TOTAL: 3.3 g/dL (ref 2.2–3.9)
Gamma Globulin: 1.1 g/dL (ref 0.4–1.8)
Total Protein ELP: 6.2 g/dL (ref 6.0–8.5)

## 2016-10-16 LAB — IMMUNOFIXATION ELECTROPHORESIS
IGA: 183 mg/dL (ref 64–422)
IGM, SERUM: 181 mg/dL (ref 26–217)
IgG (Immunoglobin G), Serum: 1041 mg/dL (ref 700–1600)
Total Protein ELP: 6.5 g/dL (ref 6.0–8.5)

## 2016-10-16 LAB — KAPPA/LAMBDA LIGHT CHAINS
KAPPA, LAMDA LIGHT CHAIN RATIO: 1.52 (ref 0.26–1.65)
Kappa free light chain: 37.3 mg/L — ABNORMAL HIGH (ref 3.3–19.4)
Lambda free light chains: 24.6 mg/L (ref 5.7–26.3)

## 2016-10-19 ENCOUNTER — Ambulatory Visit (HOSPITAL_COMMUNITY): Payer: Medicare Other

## 2016-10-19 ENCOUNTER — Other Ambulatory Visit (HOSPITAL_COMMUNITY): Payer: Medicare Other

## 2016-10-23 ENCOUNTER — Other Ambulatory Visit (HOSPITAL_COMMUNITY): Payer: Self-pay | Admitting: *Deleted

## 2016-10-23 ENCOUNTER — Other Ambulatory Visit (HOSPITAL_COMMUNITY): Payer: Self-pay | Admitting: Oncology

## 2016-10-23 ENCOUNTER — Encounter (HOSPITAL_COMMUNITY): Payer: Medicare Other

## 2016-10-23 DIAGNOSIS — N183 Chronic kidney disease, stage 3 unspecified: Secondary | ICD-10-CM

## 2016-10-23 DIAGNOSIS — C9000 Multiple myeloma not having achieved remission: Secondary | ICD-10-CM | POA: Diagnosis not present

## 2016-10-23 DIAGNOSIS — D631 Anemia in chronic kidney disease: Secondary | ICD-10-CM

## 2016-10-23 LAB — CBC WITH DIFFERENTIAL/PLATELET
BASOS ABS: 0 10*3/uL (ref 0.0–0.1)
BASOS PCT: 0 %
EOS ABS: 0.1 10*3/uL (ref 0.0–0.7)
Eosinophils Relative: 1 %
HEMATOCRIT: 31.3 % — AB (ref 36.0–46.0)
HEMOGLOBIN: 10.3 g/dL — AB (ref 12.0–15.0)
Lymphocytes Relative: 15 %
Lymphs Abs: 1.1 10*3/uL (ref 0.7–4.0)
MCH: 29.6 pg (ref 26.0–34.0)
MCHC: 32.9 g/dL (ref 30.0–36.0)
MCV: 89.9 fL (ref 78.0–100.0)
MONOS PCT: 11 %
Monocytes Absolute: 0.8 10*3/uL (ref 0.1–1.0)
NEUTROS ABS: 5.4 10*3/uL (ref 1.7–7.7)
NEUTROS PCT: 73 %
Platelets: 226 10*3/uL (ref 150–400)
RBC: 3.48 MIL/uL — AB (ref 3.87–5.11)
RDW: 16.6 % — ABNORMAL HIGH (ref 11.5–15.5)
WBC: 7.4 10*3/uL (ref 4.0–10.5)

## 2016-10-23 LAB — IRON AND TIBC
Iron: 69 ug/dL (ref 28–170)
SATURATION RATIOS: 20 % (ref 10.4–31.8)
TIBC: 350 ug/dL (ref 250–450)
UIBC: 281 ug/dL

## 2016-10-23 LAB — COMPREHENSIVE METABOLIC PANEL
ALBUMIN: 3.3 g/dL — AB (ref 3.5–5.0)
ALK PHOS: 75 U/L (ref 38–126)
ALT: 23 U/L (ref 14–54)
ANION GAP: 9 (ref 5–15)
AST: 41 U/L (ref 15–41)
BILIRUBIN TOTAL: 0.4 mg/dL (ref 0.3–1.2)
BUN: 21 mg/dL — AB (ref 6–20)
CALCIUM: 9.2 mg/dL (ref 8.9–10.3)
CO2: 26 mmol/L (ref 22–32)
CREATININE: 1.72 mg/dL — AB (ref 0.44–1.00)
Chloride: 103 mmol/L (ref 101–111)
GFR calc Af Amer: 32 mL/min — ABNORMAL LOW (ref >60)
GFR calc non Af Amer: 28 mL/min — ABNORMAL LOW (ref >60)
GLUCOSE: 99 mg/dL (ref 65–99)
Potassium: 3.9 mmol/L (ref 3.5–5.1)
SODIUM: 138 mmol/L (ref 135–145)
TOTAL PROTEIN: 7.7 g/dL (ref 6.5–8.1)

## 2016-10-23 LAB — SAMPLE TO BLOOD BANK

## 2016-10-23 LAB — FERRITIN: Ferritin: 35 ng/mL (ref 11–307)

## 2016-10-26 ENCOUNTER — Encounter (HOSPITAL_BASED_OUTPATIENT_CLINIC_OR_DEPARTMENT_OTHER): Payer: Medicare Other

## 2016-10-26 ENCOUNTER — Other Ambulatory Visit (HOSPITAL_COMMUNITY): Payer: Medicare Other

## 2016-10-26 ENCOUNTER — Encounter (HOSPITAL_COMMUNITY): Payer: Self-pay

## 2016-10-26 ENCOUNTER — Ambulatory Visit (HOSPITAL_COMMUNITY): Payer: Medicare Other

## 2016-10-26 VITALS — BP 122/57 | HR 69 | Temp 98.1°F | Resp 18

## 2016-10-26 DIAGNOSIS — D631 Anemia in chronic kidney disease: Secondary | ICD-10-CM

## 2016-10-26 DIAGNOSIS — E538 Deficiency of other specified B group vitamins: Secondary | ICD-10-CM

## 2016-10-26 DIAGNOSIS — N183 Chronic kidney disease, stage 3 unspecified: Secondary | ICD-10-CM

## 2016-10-26 DIAGNOSIS — D801 Nonfamilial hypogammaglobulinemia: Secondary | ICD-10-CM

## 2016-10-26 MED ORDER — SODIUM CHLORIDE 0.9 % IV SOLN
Freq: Once | INTRAVENOUS | Status: AC
  Administered 2016-10-26: 14:00:00 via INTRAVENOUS

## 2016-10-26 MED ORDER — SODIUM CHLORIDE 0.9 % IV SOLN
125.0000 mg | Freq: Once | INTRAVENOUS | Status: AC
  Administered 2016-10-26: 125 mg via INTRAVENOUS
  Filled 2016-10-26: qty 10

## 2016-10-26 NOTE — Patient Instructions (Signed)
[facility] at [facility] Discharge Instructions  RECOMMENDATIONS MADE BY THE CONSULTANT AND ANY TEST RESULTS WILL BE SENT TO YOUR REFERRING PHYSICIAN.  Iron infusion today. Return as scheduled.   Thank you for choosing  [facility] at [facility] to provide your oncology and hematology care.  To afford each patient quality time with our provider, please arrive at least 15 minutes before your scheduled appointment time.    If you have a lab appointment with the Cancer Center please come in thru the  Main Entrance and check in at the main information desk  You need to re-schedule your appointment should you arrive 10 or more minutes late.  We strive to give you quality time with our providers, and arriving late affects you and other patients whose appointments are after yours.  Also, if you no show three or more times for appointments you may be dismissed from the clinic at the providers discretion.     Again, thank you for choosing [facility].  Our hope is that these requests will decrease the amount of time that you wait before being seen by our physicians.       _____________________________________________________________  Should you have questions after your visit to [facility], please contact our office at [phone] between the hours of 8:30 a.m. and 4:30 p.m.  Voicemails left after 4:30 p.m. will not be returned until the following business day.  For prescription refill requests, have your pharmacy contact our office.       Resources For Cancer Patients and their Caregivers ? American Cancer Society: Can assist with transportation, wigs, general needs, runs Look Good Feel Better.        1-888-227-6333 ? Cancer Care: Provides financial assistance, online support groups, medication/co-pay assistance.  1-800-813-HOPE (4673) ? [facility] Assists [place] cancer patients and their  families through emotional , educational and financial support.  336-427-4357 ? [place] DSS Where to apply for food stamps, Medicaid and utility assistance. 336-342-1394 ? [employer]: Transportation to medical appointments. 336-347-2287 ? Social Security Administration: May apply for disability if have a Stage IV cancer. 336-342-7796 1-800-772-1213 ? [place] Aging, Disability and Transit Services: Assists with nutrition, care and transit needs. 336-349-2343  Cancer Center Support Programs: @10RELATIVEDAYS@ > Cancer Support Group  2nd Tuesday of the month 1pm-2pm, Journey Room  > Creative Journey  3rd Tuesday of the month 1130am-1pm, Journey Room  > Look Good Feel Better  1st Wednesday of the month 10am-12 noon, Journey Room (Call [employer] to register [phone])   

## 2016-10-26 NOTE — Progress Notes (Signed)
Tolerated infusion w/o adverse reaction.  Alert, in no distress.  VSS.  Discharged ambulatory in c/o friend.  

## 2016-11-01 ENCOUNTER — Ambulatory Visit (HOSPITAL_COMMUNITY): Payer: Medicare Other

## 2016-11-02 ENCOUNTER — Ambulatory Visit (HOSPITAL_COMMUNITY): Payer: Medicare Other

## 2016-11-02 ENCOUNTER — Other Ambulatory Visit (HOSPITAL_COMMUNITY): Payer: Medicare Other

## 2016-11-02 ENCOUNTER — Encounter (HOSPITAL_COMMUNITY): Payer: Self-pay

## 2016-11-02 ENCOUNTER — Encounter (HOSPITAL_BASED_OUTPATIENT_CLINIC_OR_DEPARTMENT_OTHER): Payer: Medicare Other

## 2016-11-02 VITALS — BP 119/42 | HR 70 | Temp 97.8°F | Resp 20

## 2016-11-02 DIAGNOSIS — D509 Iron deficiency anemia, unspecified: Secondary | ICD-10-CM | POA: Diagnosis present

## 2016-11-02 DIAGNOSIS — D631 Anemia in chronic kidney disease: Secondary | ICD-10-CM

## 2016-11-02 DIAGNOSIS — N183 Chronic kidney disease, stage 3 (moderate): Secondary | ICD-10-CM

## 2016-11-02 DIAGNOSIS — D801 Nonfamilial hypogammaglobulinemia: Secondary | ICD-10-CM

## 2016-11-02 DIAGNOSIS — E538 Deficiency of other specified B group vitamins: Secondary | ICD-10-CM

## 2016-11-02 MED ORDER — SODIUM CHLORIDE 0.9 % IV SOLN
Freq: Once | INTRAVENOUS | Status: AC
  Administered 2016-11-02: 14:00:00 via INTRAVENOUS

## 2016-11-02 MED ORDER — SODIUM CHLORIDE 0.9 % IV SOLN
125.0000 mg | Freq: Once | INTRAVENOUS | Status: AC
  Administered 2016-11-02: 125 mg via INTRAVENOUS
  Filled 2016-11-02: qty 10

## 2016-11-02 NOTE — Progress Notes (Signed)
Ferric gluconate 125 mg given today per orders. Patient tolerated it well, no issues. Vitals stable and discharged home ambulatory from clinic.follow up as scheduled.

## 2016-11-02 NOTE — Patient Instructions (Signed)
[place] at [facility] Discharge Instructions  RECOMMENDATIONS MADE BY THE CONSULTANT AND ANY TEST RESULTS WILL BE SENT TO YOUR REFERRING PHYSICIAN.  Iron given today Follow up as scheduled.  Thank you for choosing [place] at [facility] to provide your oncology and hematology care.  To afford each patient quality time with our provider, please arrive at least 15 minutes before your scheduled appointment time.    If you have a lab appointment with the [place] please come in thru the  Main Entrance and check in at the main information desk  You need to re-schedule your appointment should you arrive 10 or more minutes late.  We strive to give you quality time with our providers, and arriving late affects you and other patients whose appointments are after yours.  Also, if you no show three or more times for appointments you may be dismissed from the clinic at the providers discretion.     Again, thank you for choosing [facility].  Our hope is that these requests will decrease the amount of time that you wait before being seen by our physicians.       _____________________________________________________________  Should you have questions after your visit to [facility], please contact our office at [phone] between the hours of 8:30 a.m. and 4:30 p.m.  Voicemails left after 4:30 p.m. will not be returned until the following business day.  For prescription refill requests, have your pharmacy contact our office.       Resources For Cancer Patients and their Caregivers ? American Cancer Society: Can assist with transportation, wigs, general needs, runs Look Good Feel Better.        (614) 280-0431 ? Cancer Care: Provides financial assistance, online support groups, medication/co-pay assistance.  1-800-813-HOPE (902)790-4967) ? [place] Assists [place] cancer patients and their  families through emotional , educational and financial support.  609-421-1457 ? [place] DSS Where to apply for food stamps, Medicaid and utility assistance. 816-834-4757 ? [employer]: Transportation to medical appointments. 6574634084 ? Social Security Administration: May apply for disability if have a Stage IV cancer. (503)719-6931 872 421 1352 ? [employer], Disability and Transit Services: Assists with nutrition, care and transit needs. [place] Support Programs: @10RELATIVEDAYS @ > Cancer Support Group  2nd Tuesday of the month 1pm-2pm, Journey Room  > Creative Journey  3rd Tuesday of the month 1130am-1pm, Journey Room  > Look Good Feel Better  1st Wednesday of the month 10am-12 noon, Journey Room (Call [place] to register [phone])

## 2016-11-02 NOTE — Progress Notes (Signed)
Tolerated iron infusion without any problems. Discharged ambulatory with friend and in stable condition.

## 2016-11-13 ENCOUNTER — Encounter (HOSPITAL_COMMUNITY): Payer: Medicare Other | Attending: Oncology

## 2016-11-13 ENCOUNTER — Encounter (HOSPITAL_COMMUNITY): Payer: Self-pay

## 2016-11-13 ENCOUNTER — Encounter (HOSPITAL_BASED_OUTPATIENT_CLINIC_OR_DEPARTMENT_OTHER): Payer: Medicare Other

## 2016-11-13 ENCOUNTER — Encounter (HOSPITAL_BASED_OUTPATIENT_CLINIC_OR_DEPARTMENT_OTHER): Payer: Medicare Other | Admitting: Oncology

## 2016-11-13 VITALS — BP 122/48 | HR 79 | Temp 97.7°F | Resp 18 | Wt 150.4 lb

## 2016-11-13 DIAGNOSIS — C9000 Multiple myeloma not having achieved remission: Secondary | ICD-10-CM

## 2016-11-13 DIAGNOSIS — D631 Anemia in chronic kidney disease: Secondary | ICD-10-CM | POA: Diagnosis not present

## 2016-11-13 DIAGNOSIS — N183 Chronic kidney disease, stage 3 unspecified: Secondary | ICD-10-CM

## 2016-11-13 DIAGNOSIS — E538 Deficiency of other specified B group vitamins: Secondary | ICD-10-CM

## 2016-11-13 DIAGNOSIS — D801 Nonfamilial hypogammaglobulinemia: Secondary | ICD-10-CM

## 2016-11-13 DIAGNOSIS — Z79899 Other long term (current) drug therapy: Secondary | ICD-10-CM | POA: Diagnosis not present

## 2016-11-13 DIAGNOSIS — I130 Hypertensive heart and chronic kidney disease with heart failure and stage 1 through stage 4 chronic kidney disease, or unspecified chronic kidney disease: Secondary | ICD-10-CM | POA: Diagnosis not present

## 2016-11-13 DIAGNOSIS — Z87891 Personal history of nicotine dependence: Secondary | ICD-10-CM | POA: Diagnosis not present

## 2016-11-13 DIAGNOSIS — J449 Chronic obstructive pulmonary disease, unspecified: Secondary | ICD-10-CM | POA: Diagnosis not present

## 2016-11-13 DIAGNOSIS — I5030 Unspecified diastolic (congestive) heart failure: Secondary | ICD-10-CM | POA: Diagnosis not present

## 2016-11-13 DIAGNOSIS — D649 Anemia, unspecified: Secondary | ICD-10-CM | POA: Insufficient documentation

## 2016-11-13 DIAGNOSIS — C9001 Multiple myeloma in remission: Secondary | ICD-10-CM | POA: Diagnosis present

## 2016-11-13 DIAGNOSIS — R159 Full incontinence of feces: Secondary | ICD-10-CM | POA: Insufficient documentation

## 2016-11-13 DIAGNOSIS — R5383 Other fatigue: Secondary | ICD-10-CM | POA: Diagnosis not present

## 2016-11-13 DIAGNOSIS — Z7982 Long term (current) use of aspirin: Secondary | ICD-10-CM | POA: Diagnosis not present

## 2016-11-13 LAB — CBC
HCT: 28.8 % — ABNORMAL LOW (ref 36.0–46.0)
HEMOGLOBIN: 9.6 g/dL — AB (ref 12.0–15.0)
MCH: 31.5 pg (ref 26.0–34.0)
MCHC: 33.3 g/dL (ref 30.0–36.0)
MCV: 94.4 fL (ref 78.0–100.0)
Platelets: 205 10*3/uL (ref 150–400)
RBC: 3.05 MIL/uL — ABNORMAL LOW (ref 3.87–5.11)
RDW: 17.7 % — AB (ref 11.5–15.5)
WBC: 6 10*3/uL (ref 4.0–10.5)

## 2016-11-13 LAB — HEPATIC FUNCTION PANEL
ALBUMIN: 3.3 g/dL — AB (ref 3.5–5.0)
ALK PHOS: 71 U/L (ref 38–126)
ALT: 12 U/L — AB (ref 14–54)
AST: 24 U/L (ref 15–41)
Bilirubin, Direct: <0.1 mg/dL — ABNORMAL LOW (ref 0.1–0.5)
TOTAL PROTEIN: 7 g/dL (ref 6.5–8.1)
Total Bilirubin: 0.4 mg/dL (ref 0.3–1.2)

## 2016-11-13 LAB — BASIC METABOLIC PANEL
ANION GAP: 7 (ref 5–15)
BUN: 22 mg/dL — AB (ref 6–20)
CHLORIDE: 106 mmol/L (ref 101–111)
CO2: 25 mmol/L (ref 22–32)
Calcium: 8.7 mg/dL — ABNORMAL LOW (ref 8.9–10.3)
Creatinine, Ser: 1.61 mg/dL — ABNORMAL HIGH (ref 0.44–1.00)
GFR calc Af Amer: 35 mL/min — ABNORMAL LOW (ref >60)
GFR, EST NON AFRICAN AMERICAN: 30 mL/min — AB (ref >60)
Glucose, Bld: 97 mg/dL (ref 65–99)
POTASSIUM: 4 mmol/L (ref 3.5–5.1)
SODIUM: 138 mmol/L (ref 135–145)

## 2016-11-13 LAB — LACTATE DEHYDROGENASE: LDH: 171 U/L (ref 98–192)

## 2016-11-13 MED ORDER — CYANOCOBALAMIN 1000 MCG/ML IJ SOLN
INTRAMUSCULAR | Status: AC
  Filled 2016-11-13: qty 1

## 2016-11-13 MED ORDER — ACETAMINOPHEN 325 MG PO TABS
650.0000 mg | ORAL_TABLET | Freq: Four times a day (QID) | ORAL | Status: DC | PRN
  Administered 2016-11-13: 650 mg via ORAL
  Filled 2016-11-13: qty 2

## 2016-11-13 MED ORDER — DIPHENHYDRAMINE HCL 25 MG PO TABS
25.0000 mg | ORAL_TABLET | ORAL | Status: AC | PRN
  Administered 2016-11-13: 25 mg via ORAL
  Filled 2016-11-13 (×2): qty 1

## 2016-11-13 MED ORDER — IMMUNE GLOBULIN (HUMAN) 10 GM/100ML IV SOLN
1.0000 g/kg | Freq: Once | INTRAVENOUS | Status: AC
  Administered 2016-11-13: 70 g via INTRAVENOUS
  Filled 2016-11-13: qty 600

## 2016-11-13 MED ORDER — ZOLEDRONIC ACID 4 MG/5ML IV CONC
3.0000 mg | Freq: Once | INTRAVENOUS | Status: AC
  Administered 2016-11-13: 3 mg via INTRAVENOUS
  Filled 2016-11-13: qty 3.75

## 2016-11-13 MED ORDER — DARBEPOETIN ALFA 60 MCG/0.3ML IJ SOSY
60.0000 ug | PREFILLED_SYRINGE | Freq: Once | INTRAMUSCULAR | Status: AC
  Administered 2016-11-13: 60 ug via SUBCUTANEOUS

## 2016-11-13 MED ORDER — DARBEPOETIN ALFA 60 MCG/0.3ML IJ SOSY
PREFILLED_SYRINGE | INTRAMUSCULAR | Status: AC
  Filled 2016-11-13: qty 0.3

## 2016-11-13 MED ORDER — CYANOCOBALAMIN 1000 MCG/ML IJ SOLN
1000.0000 ug | Freq: Once | INTRAMUSCULAR | Status: AC
  Administered 2016-11-13: 1000 ug via INTRAMUSCULAR

## 2016-11-13 NOTE — Progress Notes (Signed)
PROGRESS NOTE  Carol Chroman, MD [place] [address]  No diagnosis found.  CURRENT THERAPY: Aranesp every 2 weeks, Zometa 3 mg monthly, B12 injection monthly, and IVIG monthly.  INTERVAL HISTORY: Carol Bradley 76 y.o. female returns for followup of IgG kappa Myeloma, in remission with intolerance to Revlimid/Dexamethasone after taking for many months and declining QOL. AND Hypogammaglobulinemia, on IVIG monthly. AND Anemia in chronic renal disease, Stage III.  On ESA therapy, Aranesp, every 2 weeks.    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       03/18/2015 - 07/28/2015 Chemotherapy    Velcade 1.6 mg/m2 discontinued secondary to intolerance      07/28/2015 Adverse Reaction    stool incontinence, weakness, collapse upon standing, felt to be secondary to velcade      11/09/2015 - 12/13/2015 Chemotherapy    cytoxan IV 300 mg/m2 administered X 2 doses only in addition to rev/dex      11/09/2015 - 06/08/2016 Chemotherapy    Revlimid/Dexamethasone       Carol Bradley presents today for continuing follow up and is in a treatment chair.. I personally reviewed and went over labs with the patient.  Her energy level and stamina have significantly improved.  She reports her son made her an appointment to see Dr. Norma Fredrickson at [facility] in regards to her MM. She has seen him and she has a bone marrow biopsy scheduled for tomorrow.   She reports sinus issues due to the pollen and allergies.   She notes she has occasional leg swelling bilaterally. She takes Lasix only when they swell.   Denies infections, and abdominal pain..     Review of Systems  Constitutional: Negative.   HENT: Negative.        Sinuses due to pollen.   Eyes: Negative.   Respiratory: Negative.   Cardiovascular: Positive for leg  swelling (ocassional).  Gastrointestinal: Negative.  Negative for abdominal pain.  Genitourinary: Negative.   Musculoskeletal: Negative.   Skin: Negative.   Neurological: Negative.   Psychiatric/Behavioral: Negative.     Past Medical History:  Diagnosis Date  . 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 CAD (coronary artery disease)   . COPD (chronic obstructive pulmonary disease) ([place])   . History of tobacco abuse   . Hyperlipidemia   . Hypertension   . Hypogammaglobulinemia ([place]) 01/15/2016  . Multiple myeloma ([place])   . Vitamin B12 deficiency 04/15/2016   Overview:  Vitamin B12 level documented 155, January 2016 with the normal range being 211-924    Past Surgical History:  Procedure Laterality Date  . BACK SURGERY    . CARDIAC CATHETERIZATION  04/2011   right and left cath showing normal right heart pressures,but newly diagnosed coronary artery disease/drug eluting stent placed to RCA with residual disease in the proximal RCA and LAD and ramus, normal LV function and 60-65% EF  . COLONOSCOPY N/A 09/30/2014   Procedure: COLONOSCOPY;  Surgeon: Rogene Houston, MD;  Location: [facility];  Service: Endoscopy;  Laterality: N/A;  225  . CORONARY ANGIOPLASTY WITH STENT PLACEMENT  04/2011   mid RCA: 3.0 X38 mm Promus DES. Residual 40% disease proximally  . NECK SURGERY      Family History  Problem Relation Age of Onset  .  Heart failure Mother   . Cancer Father     Social History   Social History  . Marital status: Married    Spouse name: N/A  . Number of children: N/A  . Years of education: N/A   Occupational History  . 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 Smoking status: Former Smoker    Packs/day: 3.00    Years: 25.00    Types: Cigarettes    Quit date: 07/10/1994  . Smokeless tobacco: Never Used  . Alcohol use No  . Drug use: Unknown  . Sexual activity: Not on file   Other Topics Concern  . Not on file    Social History Narrative  . No narrative on file     PHYSICAL EXAMINATION  ECOG PERFORMANCE STATUS: 1 - Symptomatic but completely ambulatory   Physical Exam  Constitutional: She is oriented to person, place, and time and well-developed, well-nourished, and in no distress.  Physical exam was performed in a treatment chair  HENT:  Head: Normocephalic and atraumatic.  Eyes: Conjunctivae and EOM are normal. Pupils are equal, round, and reactive to light.  Neck: Normal range of motion. Neck supple.  Cardiovascular: Normal rate, regular rhythm and normal heart sounds.   Pulmonary/Chest: Effort normal and breath sounds normal.  Abdominal: Soft. Bowel sounds are normal.  Musculoskeletal: Normal range of motion.  Neurological: She is alert and oriented to person, place, and time. Gait normal.  Skin: Skin is warm and dry.  Nursing note and vitals reviewed.   LABORATORY DATA: CBC    Component Value Date/Time   WBC 7.4 10/23/2016 1110   RBC 3.48 (L) 10/23/2016 1110   HGB 10.3 (L) 10/23/2016 1110   HCT 31.3 (L) 10/23/2016 1110   PLT 226 10/23/2016 1110   MCV 89.9 10/23/2016 1110   MCH 29.6 10/23/2016 1110   MCHC 32.9 10/23/2016 1110   RDW 16.6 (H) 10/23/2016 1110   LYMPHSABS 1.1 10/23/2016 1110   MONOABS 0.8 10/23/2016 1110   EOSABS 0.1 10/23/2016 1110   BASOSABS 0.0 10/23/2016 1110      Chemistry      Component Value Date/Time   NA 138 10/23/2016 1110   K 3.9 10/23/2016 1110   CL 103 10/23/2016 1110   CO2 26 10/23/2016 1110   BUN 21 (H) 10/23/2016 1110   CREATININE 1.72 (H) 10/23/2016 1110      Component Value Date/Time   CALCIUM 9.2 10/23/2016 1110   ALKPHOS 75 10/23/2016 1110   AST 41 10/23/2016 1110   ALT 23 10/23/2016 1110   BILITOT 0.4 10/23/2016 1110      Lab Results  Component Value Date   PROT 7.7 10/23/2016   ALBUMINELP 2.9 10/13/2016   A1GS 0.3 10/13/2016   A2GS 1.0 10/13/2016   BETS 0.9 10/13/2016   GAMS 1.1 10/13/2016   MSPIKE Not Observed  10/13/2016   SPEI Comment 10/13/2016   SPECOM Comment 10/13/2016   IGGSERUM 1,065 10/13/2016   IGGSERUM 1,041 10/13/2016   IGA 183 10/13/2016   IGA 183 10/13/2016   IGMSERUM 181 10/13/2016   IGMSERUM 181 10/13/2016   KPAFRELGTCHN 37.3 (H) 10/13/2016   LAMBDASER 24.6 10/13/2016   KAPLAMBRATIO 1.52 10/13/2016    PENDING LABS:   RADIOGRAPHIC STUDIES:  No results found.   PATHOLOGY:    ASSESSMENT AND PLAN:  Multiple myeloma (HCC) IgG kappa Myeloma, in remission with intolerance to Revlimid/Dexamethasone after taking for many months.  She remains on Zometa  monthly at a reduced dose due to renal function.  Her M spike has been undetectable on her last 3 SPEP tests.  Labs in 1 month with CBC diff, CMET, SPEP+IFE, light chain assay, and B2M. Proceed with bone marrow biopsy as scheduled with Dr. Norma Fredrickson.  Hypogammaglobulinemia (HCC) Hypogammamglobulinemia, on IVIG monthly. Immunoglobulin levels have been good since she's been on IVIG.  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   On ESA therapy, Aranesp, every 2 weeks.   THERAPY PLAN:  Aranesp every 2 weeks, Zometa 3 mg monthly, B12 injection monthly, and IVIG monthly.  RTC in 1 month.   Orders Placed This Encounter  Procedures  . CBC with Differential    Standing Status:   Future    Standing Expiration Date:   11/13/2017  . Comprehensive metabolic panel    Standing Status:   Future    Standing Expiration Date:   11/13/2017  . Protein electrophoresis, serum    Standing Status:   Future    Standing Expiration Date:   11/13/2017  . Kappa/lambda light chains    Standing Status:   Future    Standing Expiration Date:   11/13/2017  . Beta 2 microglobuline, serum    Standing Status:   Future    Standing Expiration Date:   11/13/2017    All questions were answered. The patient knows to call the clinic with  any problems, questions or concerns. We can certainly see the patient much sooner if necessary.  This document serves as a record of services personally performed by Twana First, MD. It was created on her behalf by Shirlean Mylar, a trained medical scribe. The creation of this record is based on the scribe's personal observations and the provider's statements to them. This document has been checked and approved by the attending provider.  I have reviewed the above documentation for accuracy and completeness and I agree with the above.  This note is electronically signed by: Mikey College 11/13/2016 8:49 AM

## 2016-11-13 NOTE — Patient Instructions (Signed)
[place] at [facility] Discharge Instructions  RECOMMENDATIONS MADE BY THE CONSULTANT AND ANY TEST RESULTS WILL BE SENT TO YOUR REFERRING PHYSICIAN.  Today you received IVIG infusion. You also received zometa infusion. You received B12 1000 mcg injection. You received aranesp 60 mcg injection. Return as scheduled.  Thank you for choosing [place] at [facility] to provide your oncology and hematology care.  To afford each patient quality time with our provider, please arrive at least 15 minutes before your scheduled appointment time.    If you have a lab appointment with the [facility] please come in thru the  Main Entrance and check in at the main information desk  You need to re-schedule your appointment should you arrive 10 or more minutes late.  We strive to give you quality time with our providers, and arriving late affects you and other patients whose appointments are after yours.  Also, if you no show three or more times for appointments you may be dismissed from the clinic at the providers discretion.     Again, thank you for choosing [facility].  Our hope is that these requests will decrease the amount of time that you wait before being seen by our physicians.       _____________________________________________________________  Should you have questions after your visit to [facility], please contact our office at [phone] between the hours of 8:30 a.m. and 4:30 p.m.  Voicemails left after 4:30 p.m. will not be returned until the following business day.  For prescription refill requests, have your pharmacy contact our office.       Resources For Cancer Patients and their Caregivers ? American Cancer Society: Can assist with transportation, wigs, general needs, runs Look Good Feel Better.        670-682-5824 ? Cancer Care: Provides financial assistance, online support groups,  medication/co-pay assistance.  1-800-813-HOPE 571-849-4033) ? [facility] Assists [place] cancer patients and their families through emotional , educational and financial support.  864-252-3121 ? [place] DSS Where to apply for food stamps, Medicaid and utility assistance. 325-043-0702 ? RCATS: Transportation to medical appointments. 4797131063 ? Social Security Administration: May apply for disability if have a Stage IV cancer. (915)693-7781 4166441891 ? [employer], Disability and Transit Services: Assists with nutrition, care and transit needs. [place] Support Programs: @10RELATIVEDAYS @ > Cancer Support Group  2nd Tuesday of the month 1pm-2pm, Journey Room  > Creative Journey  3rd Tuesday of the month 1130am-1pm, Journey Room  > Look Good Feel Better  1st Wednesday of the month 10am-12 noon, Journey Room (Call [place] to register [phone])

## 2016-11-13 NOTE — Progress Notes (Signed)
Corrected calcium per B.[place] is 9.2. Tolerated ivig infusion well. Tolerated zometa infusion well. Carol Bradley presents today for injection per MD orders. B12 administered IM in left Upper Arm. Administration without incident. Patient tolerated well. Carol Bradley presents today for injection per MD orders. Aranesp 60 mcg administered SQ in right Abdomen. Administration without incident. Patient tolerated well.  Stable and ambulatory on discharge home to self.

## 2016-11-13 NOTE — Patient Instructions (Addendum)
[place] at [facility] Discharge Instructions  RECOMMENDATIONS MADE BY THE CONSULTANT AND ANY TEST RESULTS WILL BE SENT TO YOUR REFERRING PHYSICIAN.  You were seen today by Dr. Twana First You got treatment today Follow up in 4 weeks with lab work   Thank you for choosing [place] at [facility] to provide your oncology and hematology care.  To afford each patient quality time with our provider, please arrive at least 15 minutes before your scheduled appointment time.    If you have a lab appointment with the [facility] please come in thru the  Main Entrance and check in at the main information desk  You need to re-schedule your appointment should you arrive 10 or more minutes late.  We strive to give you quality time with our providers, and arriving late affects you and other patients whose appointments are after yours.  Also, if you no show three or more times for appointments you may be dismissed from the clinic at the providers discretion.     Again, thank you for choosing [facility].  Our hope is that these requests will decrease the amount of time that you wait before being seen by our physicians.       _____________________________________________________________  Should you have questions after your visit to [facility], please contact our office at [phone] between the hours of 8:30 a.m. and 4:30 p.m.  Voicemails left after 4:30 p.m. will not be returned until the following business day.  For prescription refill requests, have your pharmacy contact our office.       Resources For Cancer Patients and their Caregivers ? American Cancer Society: Can assist with transportation, wigs, general needs, runs Look Good Feel Better.        762-133-2901 ? Cancer Care: Provides financial assistance, online support groups, medication/co-pay assistance.  1-800-813-HOPE 587-436-5814) ? [place] Assists [place] cancer patients and their families through emotional , educational and financial support.  418-007-6961 ? [place] DSS Where to apply for food stamps, Medicaid and utility assistance. 4177185795 ? RCATS: Transportation to medical appointments. 308-586-0123 ? Social Security Administration: May apply for disability if have a Stage IV cancer. 6601985592 564-557-2376 ? [employer], Disability and Transit Services: Assists with nutrition, care and transit needs. [place] Support Programs: @10RELATIVEDAYS @ > Cancer Support Group  2nd Tuesday of the month 1pm-2pm, Journey Room  > Creative Journey  3rd Tuesday of the month 1130am-1pm, Journey Room  > Look Good Feel Better  1st Wednesday of the month 10am-12 noon, Journey Room (Call [place] to register [phone])

## 2016-11-14 LAB — IGG, IGA, IGM
IGA: 170 mg/dL (ref 64–422)
IgG (Immunoglobin G), Serum: 1225 mg/dL (ref 700–1600)
IgM, Serum: 177 mg/dL (ref 26–217)

## 2016-11-14 LAB — PROTEIN ELECTROPHORESIS, SERUM
A/G Ratio: 0.9 (ref 0.7–1.7)
Albumin ELP: 3.1 g/dL (ref 2.9–4.4)
Alpha-1-Globulin: 0.3 g/dL (ref 0.0–0.4)
Alpha-2-Globulin: 0.9 g/dL (ref 0.4–1.0)
BETA GLOBULIN: 0.9 g/dL (ref 0.7–1.3)
GAMMA GLOBULIN: 1.4 g/dL (ref 0.4–1.8)
Globulin, Total: 3.4 g/dL (ref 2.2–3.9)
Total Protein ELP: 6.5 g/dL (ref 6.0–8.5)

## 2016-11-14 LAB — BETA 2 MICROGLOBULIN, SERUM: BETA 2 MICROGLOBULIN: 4.2 mg/L — AB (ref 0.6–2.4)

## 2016-11-14 LAB — KAPPA/LAMBDA LIGHT CHAINS
KAPPA FREE LGHT CHN: 42 mg/L — AB (ref 3.3–19.4)
KAPPA, LAMDA LIGHT CHAIN RATIO: 1.57 (ref 0.26–1.65)
LAMDA FREE LIGHT CHAINS: 26.8 mg/L — AB (ref 5.7–26.3)

## 2016-11-15 ENCOUNTER — Other Ambulatory Visit (HOSPITAL_COMMUNITY): Payer: Self-pay | Admitting: Oncology

## 2016-11-20 LAB — IMMUNOFIXATION ELECTROPHORESIS
IGM, SERUM: 185 mg/dL (ref 26–217)
IgA: 175 mg/dL (ref 64–422)
IgG (Immunoglobin G), Serum: 1184 mg/dL (ref 700–1600)
TOTAL PROTEIN ELP: 6.4 g/dL (ref 6.0–8.5)

## 2016-11-27 ENCOUNTER — Other Ambulatory Visit (HOSPITAL_COMMUNITY): Payer: Medicare Other

## 2016-11-27 ENCOUNTER — Ambulatory Visit (HOSPITAL_COMMUNITY): Payer: Medicare Other

## 2016-11-28 ENCOUNTER — Encounter (HOSPITAL_COMMUNITY): Payer: Self-pay

## 2016-11-28 ENCOUNTER — Encounter (HOSPITAL_COMMUNITY): Payer: Medicare Other

## 2016-11-28 ENCOUNTER — Encounter (HOSPITAL_BASED_OUTPATIENT_CLINIC_OR_DEPARTMENT_OTHER): Payer: Medicare Other

## 2016-11-28 ENCOUNTER — Other Ambulatory Visit (HOSPITAL_COMMUNITY): Payer: Self-pay | Admitting: *Deleted

## 2016-11-28 VITALS — BP 126/52 | HR 77 | Temp 97.7°F | Resp 18

## 2016-11-28 DIAGNOSIS — E538 Deficiency of other specified B group vitamins: Secondary | ICD-10-CM

## 2016-11-28 DIAGNOSIS — N183 Chronic kidney disease, stage 3 unspecified: Secondary | ICD-10-CM

## 2016-11-28 DIAGNOSIS — D631 Anemia in chronic kidney disease: Secondary | ICD-10-CM

## 2016-11-28 DIAGNOSIS — D801 Nonfamilial hypogammaglobulinemia: Secondary | ICD-10-CM

## 2016-11-28 DIAGNOSIS — C9000 Multiple myeloma not having achieved remission: Secondary | ICD-10-CM

## 2016-11-28 LAB — BASIC METABOLIC PANEL
Anion gap: 7 (ref 5–15)
BUN: 20 mg/dL (ref 6–20)
CO2: 23 mmol/L (ref 22–32)
Calcium: 8.7 mg/dL — ABNORMAL LOW (ref 8.9–10.3)
Chloride: 106 mmol/L (ref 101–111)
Creatinine, Ser: 1.6 mg/dL — ABNORMAL HIGH (ref 0.44–1.00)
GFR calc Af Amer: 35 mL/min — ABNORMAL LOW (ref >60)
GFR calc non Af Amer: 30 mL/min — ABNORMAL LOW (ref >60)
Glucose, Bld: 93 mg/dL (ref 65–99)
Potassium: 4.4 mmol/L (ref 3.5–5.1)
Sodium: 136 mmol/L (ref 135–145)

## 2016-11-28 LAB — CBC
HEMATOCRIT: 30.7 % — AB (ref 36.0–46.0)
HEMOGLOBIN: 10.2 g/dL — AB (ref 12.0–15.0)
MCH: 31.2 pg (ref 26.0–34.0)
MCHC: 33.2 g/dL (ref 30.0–36.0)
MCV: 93.9 fL (ref 78.0–100.0)
Platelets: 193 10*3/uL (ref 150–400)
RBC: 3.27 MIL/uL — ABNORMAL LOW (ref 3.87–5.11)
RDW: 16 % — AB (ref 11.5–15.5)
WBC: 5.7 10*3/uL (ref 4.0–10.5)

## 2016-11-28 LAB — SAMPLE TO BLOOD BANK

## 2016-11-28 MED ORDER — DARBEPOETIN ALFA 60 MCG/0.3ML IJ SOSY
60.0000 ug | PREFILLED_SYRINGE | Freq: Once | INTRAMUSCULAR | Status: AC
  Administered 2016-11-28: 60 ug via SUBCUTANEOUS

## 2016-11-28 MED ORDER — DARBEPOETIN ALFA 60 MCG/0.3ML IJ SOSY
PREFILLED_SYRINGE | INTRAMUSCULAR | Status: AC
  Filled 2016-11-28: qty 0.3

## 2016-11-28 NOTE — Progress Notes (Signed)
Carol Bradley tolerated Carol Bradley injection well without complaints or incident. Carol Bradley 10.2 today. VSS Pt has return appt with Dr. Tiana Loft tomorrow at [facility] for follow-up of bone marrow biopsy.Pt discharged self ambulatory in satisfactory condition accompanied by a friend

## 2016-11-28 NOTE — Patient Instructions (Signed)
[facility] at [facility] Discharge Instructions  RECOMMENDATIONS MADE BY THE CONSULTANT AND ANY TEST RESULTS WILL BE SENT TO YOUR REFERRING PHYSICIAN.  Received Aranesp injection today. Follow-up as scheduled. Call clinic for any questions or concerns  Thank you for choosing [facility] at [facility] to provide your oncology and hematology care.  To afford each patient quality time with our provider, please arrive at least 15 minutes before your scheduled appointment time.    If you have a lab appointment with the Cancer Center please come in thru the  Main Entrance and check in at the main information desk  You need to re-schedule your appointment should you arrive 10 or more minutes late.  We strive to give you quality time with our providers, and arriving late affects you and other patients whose appointments are after yours.  Also, if you no show three or more times for appointments you may be dismissed from the clinic at the providers discretion.     Again, thank you for choosing [facility].  Our hope is that these requests will decrease the amount of time that you wait before being seen by our physicians.       _____________________________________________________________  Should you have questions after your visit to [facility], please contact our office at [phone] between the hours of 8:30 a.m. and 4:30 p.m.  Voicemails left after 4:30 p.m. will not be returned until the following business day.  For prescription refill requests, have your pharmacy contact our office.       Resources For Cancer Patients and their Caregivers ? American Cancer Society: Can assist with transportation, wigs, general needs, runs Look Good Feel Better.        1-888-227-6333 ? Cancer Care: Provides financial assistance, online support groups, medication/co-pay assistance.  1-800-813-HOPE (4673) ? [facility] Assists [place] cancer patients and their families through emotional , educational and financial support.  336-427-4357 ? [place] DSS Where to apply for food stamps, Medicaid and utility assistance. 336-342-1394 ? RCATS: Transportation to medical appointments. 336-347-2287 ? Social Security Administration: May apply for disability if have a Stage IV cancer. 336-342-7796 1-800-772-1213 ? [place] Aging, Disability and Transit Services: Assists with nutrition, care and transit needs. 336-349-2343  Cancer Center Support Programs: @10RELATIVEDAYS@ > Cancer Support Group  2nd Tuesday of the month 1pm-2pm, Journey Room  > Creative Journey  3rd Tuesday of the month 1130am-1pm, Journey Room  > Look Good Feel Better  1st Wednesday of the month 10am-12 noon, Journey Room (Call American Cancer [employer] to register 1-800-395-5775)   

## 2016-12-01 DIAGNOSIS — I2510 Atherosclerotic heart disease of native coronary artery without angina pectoris: Secondary | ICD-10-CM | POA: Insufficient documentation

## 2016-12-01 DIAGNOSIS — E785 Hyperlipidemia, unspecified: Secondary | ICD-10-CM | POA: Insufficient documentation

## 2016-12-01 DIAGNOSIS — I10 Essential (primary) hypertension: Secondary | ICD-10-CM | POA: Insufficient documentation

## 2016-12-07 ENCOUNTER — Telehealth (HOSPITAL_COMMUNITY): Payer: Self-pay

## 2016-12-07 NOTE — Telephone Encounter (Signed)
Patient called stating she saw Dr. Norma Fredrickson at [facility] earlier this week and had a bone marrow biopsy 3 weeks ago. She states according to Dr. Norma Fredrickson she is in remission and should stop the IVIG and Zometa. He recommends starting Velcade again. Also she should only receive the Aranesp if her hgb is less than 10 instead of 11. Patient said Dr. Norma Fredrickson was supposed to send a note with all the above recommendations. Found the note and gave to Dr. Talbert Cage. MD states she will review it later today. Patient is aware to keep appts for Monday.

## 2016-12-11 ENCOUNTER — Encounter (HOSPITAL_COMMUNITY): Payer: Self-pay

## 2016-12-11 ENCOUNTER — Encounter (HOSPITAL_COMMUNITY): Payer: Medicare Other

## 2016-12-11 ENCOUNTER — Encounter (HOSPITAL_BASED_OUTPATIENT_CLINIC_OR_DEPARTMENT_OTHER): Payer: Medicare Other | Admitting: Oncology

## 2016-12-11 ENCOUNTER — Encounter (HOSPITAL_COMMUNITY): Payer: Medicare Other | Attending: Oncology

## 2016-12-11 VITALS — BP 129/49 | HR 71 | Temp 97.5°F | Resp 18 | Wt 150.4 lb

## 2016-12-11 DIAGNOSIS — C9000 Multiple myeloma not having achieved remission: Secondary | ICD-10-CM

## 2016-12-11 DIAGNOSIS — E538 Deficiency of other specified B group vitamins: Secondary | ICD-10-CM | POA: Diagnosis present

## 2016-12-11 DIAGNOSIS — C9001 Multiple myeloma in remission: Secondary | ICD-10-CM | POA: Insufficient documentation

## 2016-12-11 DIAGNOSIS — Z87891 Personal history of nicotine dependence: Secondary | ICD-10-CM | POA: Insufficient documentation

## 2016-12-11 DIAGNOSIS — I129 Hypertensive chronic kidney disease with stage 1 through stage 4 chronic kidney disease, or unspecified chronic kidney disease: Secondary | ICD-10-CM | POA: Diagnosis not present

## 2016-12-11 DIAGNOSIS — I2510 Atherosclerotic heart disease of native coronary artery without angina pectoris: Secondary | ICD-10-CM | POA: Insufficient documentation

## 2016-12-11 DIAGNOSIS — J449 Chronic obstructive pulmonary disease, unspecified: Secondary | ICD-10-CM | POA: Diagnosis not present

## 2016-12-11 DIAGNOSIS — D631 Anemia in chronic kidney disease: Secondary | ICD-10-CM | POA: Diagnosis not present

## 2016-12-11 DIAGNOSIS — N183 Chronic kidney disease, stage 3 unspecified: Secondary | ICD-10-CM

## 2016-12-11 DIAGNOSIS — E785 Hyperlipidemia, unspecified: Secondary | ICD-10-CM | POA: Diagnosis not present

## 2016-12-11 DIAGNOSIS — Z9221 Personal history of antineoplastic chemotherapy: Secondary | ICD-10-CM | POA: Diagnosis not present

## 2016-12-11 DIAGNOSIS — D801 Nonfamilial hypogammaglobulinemia: Secondary | ICD-10-CM | POA: Diagnosis not present

## 2016-12-11 DIAGNOSIS — Z9889 Other specified postprocedural states: Secondary | ICD-10-CM | POA: Insufficient documentation

## 2016-12-11 LAB — COMPREHENSIVE METABOLIC PANEL
ALBUMIN: 3.4 g/dL — AB (ref 3.5–5.0)
ALT: 12 U/L — ABNORMAL LOW (ref 14–54)
AST: 25 U/L (ref 15–41)
Alkaline Phosphatase: 87 U/L (ref 38–126)
Anion gap: 9 (ref 5–15)
BILIRUBIN TOTAL: 0.4 mg/dL (ref 0.3–1.2)
BUN: 23 mg/dL — ABNORMAL HIGH (ref 6–20)
CALCIUM: 9 mg/dL (ref 8.9–10.3)
CO2: 24 mmol/L (ref 22–32)
Chloride: 108 mmol/L (ref 101–111)
Creatinine, Ser: 1.64 mg/dL — ABNORMAL HIGH (ref 0.44–1.00)
GFR calc non Af Amer: 29 mL/min — ABNORMAL LOW (ref >60)
GFR, EST AFRICAN AMERICAN: 34 mL/min — AB (ref >60)
GLUCOSE: 103 mg/dL — AB (ref 65–99)
POTASSIUM: 3.8 mmol/L (ref 3.5–5.1)
Sodium: 141 mmol/L (ref 135–145)
TOTAL PROTEIN: 7.8 g/dL (ref 6.5–8.1)

## 2016-12-11 LAB — SAMPLE TO BLOOD BANK

## 2016-12-11 LAB — CBC WITH DIFFERENTIAL/PLATELET
BASOS PCT: 0 %
Basophils Absolute: 0 10*3/uL (ref 0.0–0.1)
EOS ABS: 0.1 10*3/uL (ref 0.0–0.7)
Eosinophils Relative: 2 %
HEMATOCRIT: 30.6 % — AB (ref 36.0–46.0)
Hemoglobin: 10.1 g/dL — ABNORMAL LOW (ref 12.0–15.0)
LYMPHS ABS: 1 10*3/uL (ref 0.7–4.0)
Lymphocytes Relative: 15 %
MCH: 30.9 pg (ref 26.0–34.0)
MCHC: 33 g/dL (ref 30.0–36.0)
MCV: 93.6 fL (ref 78.0–100.0)
MONOS PCT: 12 %
Monocytes Absolute: 0.9 10*3/uL (ref 0.1–1.0)
Neutro Abs: 5 10*3/uL (ref 1.7–7.7)
Neutrophils Relative %: 71 %
Platelets: 273 10*3/uL (ref 150–400)
RBC: 3.27 MIL/uL — ABNORMAL LOW (ref 3.87–5.11)
RDW: 15.2 % (ref 11.5–15.5)
WBC: 7 10*3/uL (ref 4.0–10.5)

## 2016-12-11 MED ORDER — CYANOCOBALAMIN 1000 MCG/ML IJ SOLN
1000.0000 ug | Freq: Once | INTRAMUSCULAR | Status: AC
  Administered 2016-12-11: 1000 ug via INTRAMUSCULAR
  Filled 2016-12-11: qty 1

## 2016-12-11 NOTE — Progress Notes (Signed)
Per Dr. Talbert Cage via Dr. Norma Fredrickson - IVIG will be held; levels will be monitored and IVIG will be restarted as needed.  Change Zometa to q 3 months.  Per Dr. Talbert Cage - change Aranesp to q 4 weeks. Continue monthly B12 injections.   Carol Bradley presents today for injection per the provider's orders.  B12 administration without incident; see MAR for injection details.  Patient tolerated procedure well and without incident.  No questions or complaints noted at this time. Discharged ambulatory.

## 2016-12-11 NOTE — Progress Notes (Signed)
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 - Plan: Multiple Myeloma Panel (SPEP&IFE w/QIG), Beta 2 microglobuline, serum, Kappa/lambda light chains, CBC with Differential, Comprehensive metabolic panel  Multiple myeloma in remission ([place]) - Plan: Multiple Myeloma Panel (SPEP&IFE w/QIG), Beta 2 microglobuline, serum, Kappa/lambda light chains, CBC with Differential, Comprehensive metabolic panel  Hypogammaglobulinemia (HCC) - Plan: Multiple Myeloma Panel (SPEP&IFE w/QIG), Beta 2 microglobuline, serum, Kappa/lambda light chains, CBC with Differential, Comprehensive metabolic panel  CURRENT THERAPY: Aranesp every 2 weeks, Zometa 3 mg monthly, B12 injection monthly, and IVIG monthly.  INTERVAL HISTORY: Carol Bradley 76 y.o. female returns for followup of IgG kappa Myeloma, in remission with intolerance to Revlimid/Dexamethasone after taking for many months and declining QOL. AND Hypogammaglobulinemia, on IVIG monthly. AND Anemia in chronic renal disease, Stage III.  On ESA therapy, Aranesp, every 2 weeks.    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       03/18/2015 - 07/28/2015 Chemotherapy    Velcade 1.6 mg/m2 discontinued secondary to intolerance      07/28/2015 Adverse Reaction    stool incontinence, weakness, collapse upon standing, felt to be secondary to velcade      11/09/2015 - 12/13/2015 Chemotherapy    cytoxan IV 300 mg/m2 administered X 2 doses only in addition to rev/dex      11/09/2015 - 06/08/2016 Chemotherapy    Revlimid/Dexamethasone       Carol Bradley presents today for continuing follow up and is in a treatment chair. I personally reviewed and went over labs with the patient.  Since her last visit she has gone to see  Dr. Norma Fredrickson on 11/29/16 for bone marrow transplant evaluation for her multiple myeloma.  Per Dr. Norma Fredrickson: "ASSESSMENT: IgG kappa myeloma with intermediate risk features, Stage II by ISS criteria currently in CR with persistent anemia that is transfusion dependent.   TREATMENT INTENT: remission with prolonged PSF  PLAN:  IgG Kappa multiple myeloma: She is in remission at the moment. Bone marrow biopsy showed no plasma cell involvement but was remarkable for mild focal fibrosis that is too small to be considered myelofibrosis. With her cytopenias, it is unclear if this is a sampling bias and other sites may show more fibrosis. She is not a good candidate for stem cell transplant due to her poor marrow reserves. Recommend maintenance therapy with bortezomib '1mg'$ /m2 once every 15 days. Referral to rheumatology to address chronic inflammation is advised by not necessary. She will RTC in 6 months.    Cytopenia: recommend not getting procrit unless hemoglobin is below 10 as a high Hb induced by ESA may increase the risk of complications. Will complete test today to check for fibrosis."  Patient states that she has been doing well. She has no complaints today. She denies any recent infections.       Review of Systems  Constitutional: Negative.  Negative for chills and fever.  HENT: Negative.  Negative for hearing loss, sore throat and tinnitus.   Eyes: Negative.  Negative for blurred vision, photophobia and discharge.  Respiratory: Negative.  Negative for cough, hemoptysis, shortness of breath and wheezing.   Cardiovascular: Negative for chest pain, palpitations, orthopnea, claudication and leg swelling.  Gastrointestinal: Negative.  Negative for abdominal  pain, constipation, diarrhea, melena, nausea and vomiting.  Genitourinary: Negative.  Negative for dysuria and hematuria.  Musculoskeletal: Negative.  Negative for back pain, joint pain and myalgias.  Skin: Negative.  Negative for itching  and rash.  Neurological: Negative.  Negative for dizziness, weakness and headaches.  Endo/Heme/Allergies: Negative for environmental allergies and polydipsia. Does not bruise/bleed easily.  Psychiatric/Behavioral: Negative.  Negative for depression. The patient is not nervous/anxious and does not have insomnia.     Past Medical History:  Diagnosis Date  . 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 CAD (coronary artery disease)   . COPD (chronic obstructive pulmonary disease) ([place])   . History of tobacco abuse   . Hyperlipidemia   . Hypertension   . Hypogammaglobulinemia ([place]) 01/15/2016  . Multiple myeloma ([place])   . Vitamin B12 deficiency 04/15/2016   Overview:  Vitamin B12 level documented 155, January 2016 with the normal range being 211-924    Past Surgical History:  Procedure Laterality Date  . BACK SURGERY    . CARDIAC CATHETERIZATION  04/2011   right and left cath showing normal right heart pressures,but newly diagnosed coronary artery disease/drug eluting stent placed to RCA with residual disease in the proximal RCA and LAD and ramus, normal LV function and 60-65% EF  . COLONOSCOPY N/A 09/30/2014   Procedure: COLONOSCOPY;  Surgeon: Rogene Houston, MD;  [facility];  Service: Endoscopy;  Laterality: N/A;  225  . CORONARY ANGIOPLASTY WITH STENT PLACEMENT  04/2011   mid RCA: 3.0 X38 mm Promus DES. Residual 40% disease proximally  . NECK SURGERY      Family History  Problem Relation Age of Onset  . Heart failure Mother   . Cancer Father     Social History   Social History  . Marital status: Married    Spouse name: N/A  . Number of children: N/A  . Years of education: N/A   Occupational History  . 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 Smoking status: Former Smoker    Packs/day: 3.00    Years: 25.00    Types: Cigarettes    Quit date: 07/10/1994  . Smokeless tobacco: Never Used  . Alcohol use No  . Drug  use: Unknown  . Sexual activity: Not Asked   Other Topics Concern  . None   Social History Narrative  . None     PHYSICAL EXAMINATION  ECOG PERFORMANCE STATUS: 1 - Symptomatic but completely ambulatory   Physical Exam  Constitutional: She is oriented to person, place, and time and well-developed, well-nourished, and in no distress. No distress.  HENT:  Head: Normocephalic and atraumatic.  Mouth/Throat: No oropharyngeal exudate.  Eyes: Conjunctivae and EOM are normal. Pupils are equal, round, and reactive to light. No scleral icterus.  Neck: Normal range of motion. Neck supple. No JVD present.  Cardiovascular: Normal rate, regular rhythm and normal heart sounds.  Exam reveals no gallop and no friction rub.   No murmur heard. Pulmonary/Chest: Effort normal and breath sounds normal. No respiratory distress. She has no wheezes. She has no rales.  Abdominal: Soft. Bowel sounds are normal. She exhibits no distension. There is no tenderness. There is no guarding.  Musculoskeletal: Normal range of motion. She exhibits no edema or tenderness.  Lymphadenopathy:    She has no cervical adenopathy.  Neurological: She is alert and oriented to person, place, and time. No cranial nerve deficit. Gait normal.  Skin: Skin is warm and dry. No rash noted. No erythema. No pallor.  Psychiatric: Affect and judgment normal.  Nursing note and vitals reviewed.   LABORATORY DATA: CBC    Component Value Date/Time   WBC 7.0 12/11/2016 0905   RBC 3.27 (L) 12/11/2016 0905   HGB 10.1 (L) 12/11/2016 0905   HCT 30.6 (L) 12/11/2016 0905   PLT 273 12/11/2016 0905   MCV 93.6 12/11/2016 0905   MCH 30.9 12/11/2016 0905   MCHC 33.0 12/11/2016 0905   RDW 15.2 12/11/2016 0905   LYMPHSABS 1.0 12/11/2016 0905   MONOABS 0.9 12/11/2016 0905   EOSABS 0.1 12/11/2016 0905   BASOSABS 0.0 12/11/2016 0905      Chemistry      Component Value Date/Time   NA 141 12/11/2016 0905   K 3.8 12/11/2016 0905   CL 108  12/11/2016 0905   CO2 24 12/11/2016 0905   BUN 23 (H) 12/11/2016 0905   CREATININE 1.64 (H) 12/11/2016 0905      Component Value Date/Time   CALCIUM 9.0 12/11/2016 0905   ALKPHOS 87 12/11/2016 0905   AST 25 12/11/2016 0905   ALT 12 (L) 12/11/2016 0905   BILITOT 0.4 12/11/2016 0905      Lab Results  Component Value Date   PROT 7.8 12/11/2016   ALBUMINELP 3.1 11/13/2016   A1GS 0.3 11/13/2016   A2GS 0.9 11/13/2016   BETS 0.9 11/13/2016   GAMS 1.4 11/13/2016   MSPIKE Not Observed 11/13/2016   SPEI Comment 11/13/2016   SPECOM Comment 11/13/2016   IGGSERUM 1,225 11/13/2016   IGGSERUM 1,184 11/13/2016   IGA 170 11/13/2016   IGA 175 11/13/2016   IGMSERUM 177 11/13/2016   IGMSERUM 185 11/13/2016   KPAFRELGTCHN 42.0 (H) 11/13/2016   LAMBDASER 26.8 (H) 11/13/2016   KAPLAMBRATIO 1.57 11/13/2016    PENDING LABS:   RADIOGRAPHIC STUDIES:  No results found.   PATHOLOGY:    ASSESSMENT AND PLAN:  Multiple myeloma ([place]) in remission IgG kappa Myeloma, in remission with intolerance to Revlimid/Dexamethasone after taking for many months.   Patient seen Dr. Norma Fredrickson at [facility] and was deemed to not be a transplant candidate due to poor bone marrow reserve. He has recommended for patient to be placed on maintenance Velcade 1 mg/m every 15 days. Patient once to start her Velcade after she completes her cataracts surgeries which are scheduled for this month. Therefore we'll place her Velcade to start in early July. I have changed her Zometa to every 3 months.Continue to monitor her lab work on a routine basis for relapse of her myeloma.  Hypogammaglobulinemia (HCC) Hypogammamglobulinemia, on IVIG monthly. Immunoglobulin levels have been good. Per patient, she was told by Dr. Norma Fredrickson that she no longer needed IVIG replacement at this time. Therefore I will d/c her monthly IVIG. Continue to monitor immunoglobulins.  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   On ESA therapy, Aranesp. Hold for hemoglobin greater than 10 g/dL. Will switch her to monthly aranesp since her hemoglobin has been steadily above 10 g/dL.   THERAPY PLAN:  Aranesp every 4 weeks, Zometa 3 mg q53month B12 injection monthly, and velcade q15 days.  RTC in 2 months for follow up with labs.  Orders Placed This Encounter  Procedures  . Multiple Myeloma Panel (SPEP&IFE w/QIG)    Standing Status:   Future    Standing Expiration Date:  12/11/2017  . Beta 2 microglobuline, serum    Standing Status:   Future    Standing Expiration Date:   12/11/2017  . Kappa/lambda light chains    Standing Status:   Future    Standing Expiration Date:   12/11/2017  . CBC with Differential    Standing Status:   Future    Standing Expiration Date:   12/11/2017  . Comprehensive metabolic panel    Standing Status:   Future    Standing Expiration Date:   12/11/2017    All questions were answered. The patient knows to call the clinic with any problems, questions or concerns. We can certainly see the patient much sooner if necessary.  This document serves as a record of services personally performed by Twana First, MD. It was created on her behalf by Shirlean Mylar, a trained medical scribe. The creation of this record is based on the scribe's personal observations and the provider's statements to them. This document has been checked and approved by the attending provider.  I have reviewed the above documentation for accuracy and completeness and I agree with the above.  This note is electronically signed by: Twana First, MD 12/11/2016 1:06 PM

## 2016-12-11 NOTE — Patient Instructions (Addendum)
[place] at [facility] Discharge Instructions  RECOMMENDATIONS MADE BY THE CONSULTANT AND ANY TEST RESULTS WILL BE SENT TO YOUR REFERRING PHYSICIAN.  You were seen today by Dr. Talbert Cage. Continue monthly B12 injections. Zometa every 3 months. Start Velcade 15 days after starting in July. Return in 2 months for labs and follow up.   Thank you for choosing [place] at [facility] to provide your oncology and hematology care.  To afford each patient quality time with our provider, please arrive at least 15 minutes before your scheduled appointment time.    If you have a lab appointment with the [facility] please come in thru the  Main Entrance and check in at the main information desk  You need to re-schedule your appointment should you arrive 10 or more minutes late.  We strive to give you quality time with our providers, and arriving late affects you and other patients whose appointments are after yours.  Also, if you no show three or more times for appointments you may be dismissed from the clinic at the providers discretion.     Again, thank you for choosing [facility].  Our hope is that these requests will decrease the amount of time that you wait before being seen by our physicians.       _____________________________________________________________  Should you have questions after your visit to [facility], please contact our office at [phone] between the hours of 8:30 a.m. and 4:30 p.m.  Voicemails left after 4:30 p.m. will not be returned until the following business day.  For prescription refill requests, have your pharmacy contact our office.       Resources For Cancer Patients and their Caregivers ? American Cancer Society: Can assist with transportation, wigs, general needs, runs Look Good Feel Better.        ([phone] ? Cancer Care: Provides financial assistance, online support groups,  medication/co-pay assistance.  1-800-813-HOPE (951)428-9061) ? [place] Assists [place] cancer patients and their families through emotional , educational and financial support.  ([phone] 505-4755 ? [place] DSS Where to apply for food stamps, Medicaid and utility assistance. ([phone] 793-1702 ? RCATS: Transportation to medical appointments. (409)197-8986 ? Social Security Administration: May apply for disability if have a Stage IV cancer. 318-690-2183 332-297-7263 ? [employer], Disability and Transit Services: Assists with nutrition, care and transit needs. [place] Support Programs: @10RELATIVEDAYS @ > Cancer Support Group  2nd Tuesday of the month 1pm-2pm, Journey Room  > Creative Journey  3rd Tuesday of the month 1130am-1pm, Journey Room  > Look Good Feel Better  1st Wednesday of the month 10am-12 noon, Journey Room (Call [facility] to register [phone])

## 2016-12-12 LAB — PROTEIN ELECTROPHORESIS, SERUM
A/G Ratio: 0.9 (ref 0.7–1.7)
ALPHA-2-GLOBULIN: 1 g/dL (ref 0.4–1.0)
Albumin ELP: 3.3 g/dL (ref 2.9–4.4)
Alpha-1-Globulin: 0.3 g/dL (ref 0.0–0.4)
Beta Globulin: 1 g/dL (ref 0.7–1.3)
GAMMA GLOBULIN: 1.4 g/dL (ref 0.4–1.8)
GLOBULIN, TOTAL: 3.7 g/dL (ref 2.2–3.9)
TOTAL PROTEIN ELP: 7 g/dL (ref 6.0–8.5)

## 2016-12-12 LAB — KAPPA/LAMBDA LIGHT CHAINS
KAPPA FREE LGHT CHN: 44.3 mg/L — AB (ref 3.3–19.4)
Kappa, lambda light chain ratio: 1.45 (ref 0.26–1.65)
LAMDA FREE LIGHT CHAINS: 30.6 mg/L — AB (ref 5.7–26.3)

## 2016-12-12 LAB — BETA 2 MICROGLOBULIN, SERUM: Beta-2 Microglobulin: 4.2 mg/L — ABNORMAL HIGH (ref 0.6–2.4)

## 2016-12-12 NOTE — Progress Notes (Signed)
Addendum:  Per Dr. Talbert Cage, we will hold Aranesp going forward for hemoglobin greater than 10.

## 2016-12-13 DIAGNOSIS — Z9842 Cataract extraction status, left eye: Secondary | ICD-10-CM | POA: Insufficient documentation

## 2017-01-03 DIAGNOSIS — H278 Other specified disorders of lens: Secondary | ICD-10-CM | POA: Insufficient documentation

## 2017-01-04 ENCOUNTER — Other Ambulatory Visit (HOSPITAL_COMMUNITY): Payer: Self-pay | Admitting: Oncology

## 2017-01-04 ENCOUNTER — Other Ambulatory Visit: Payer: Self-pay | Admitting: Cardiology

## 2017-01-07 NOTE — Progress Notes (Signed)
Carol Bradley Date of Birth: [birth year] Medical Record #[id number]  History of Present Illness: Carol Bradley is seen for follow up CAD. She has a history of COPD and mild pulmonary hypertension. She also is a history of coronary disease and is status post stenting of the mid to distal RCA in October 2012. Her presenting symptoms were of dyspnea and not chest pain. She also has labile hypertension. She developed a cough on ACE inhibitors. She developed hyponatremia on HCTZ. She has been on losartan.    She had a normal Myoview study in June 2016. She was anemic with Hgb of 8.6. This led to hematology evaluation and bone marrow bx that demonstrated that she had Multiple myeloma. She was treated with chemotherapy. Now off dexamethasone. On her last visit she was complaining of increased dyspnea. Hgb was down to 7.9. She was transfused 2 units PRBCs in December. Hgb stable since then.  She states she feels a little lightheaded and weak. Thinks her Hgb is low. Due to have this checked on Friday. Myeloma now followed by Dr. Tiana Loft at [facility]. On maintenance chemo with bortezomib twice a month. No chest pain or dyspnea.  Current Outpatient Prescriptions on File Prior to Visit  Medication Sig Dispense Refill  . acyclovir (ZOVIRAX) 200 MG capsule TAKE ONE CAPSULE BY MOUTH TWICE DAILY 180 capsule 1  . atorvastatin (LIPITOR) 10 MG tablet TAKE ONE TABLET BY MOUTH DAILY 90 tablet 1  . Calcium Carb-Cholecalciferol (CALCIUM 600+D3 PO) Take 1 tablet by mouth 3 (three) times daily.    . diazepam (VALIUM) 5 MG tablet Take 5 mg by mouth every 6 (six) hours as needed for anxiety.    Marland Kitchen diltiazem (CARDIZEM CD) 240 MG 24 hr capsule TAKE ONE CAPSULE BY MOUTH DAILY 30 capsule 6  . losartan (COZAAR) 100 MG tablet TAKE ONE TABLET BY MOUTH DAILY 90 tablet 1  . magnesium oxide (MAG-OX) 400 (241.3 Mg) MG tablet TAKE ONE TABLET BY MOUTH TWICE DAILY. WHEN TAKING LASIX 60 tablet 1  . metoprolol tartrate (LOPRESSOR) 25 MG tablet  TAKE ONE TABLET BY MOUTH TWICE DAILY 180 tablet 3  . potassium chloride (K-DUR,KLOR-CON) 10 MEQ tablet Take 1 tablet (10 mEq total) by mouth 3 (three) times daily. When taking lasix 90 tablet 2  . prochlorperazine (COMPAZINE) 10 MG tablet Take 10 mg by mouth every 6 (six) hours as needed for nausea or vomiting.     No current facility-administered medications on file prior to visit.     Allergies  Allergen Reactions  . Bortezomib Other (See Comments)    Incontinence of stool with rectal sphincter dysfunction  . Lisinopril Cough  . Plavix [Clopidogrel Bisulfate] Swelling    Past Medical History:  Diagnosis Date  . 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 CAD (coronary artery disease)   . COPD (chronic obstructive pulmonary disease) ([place])   . History of tobacco abuse   . Hyperlipidemia   . Hypertension   . Hypogammaglobulinemia ([place]) 01/15/2016  . Multiple myeloma ([place])   . Vitamin B12 deficiency 04/15/2016   Overview:  Vitamin B12 level documented 155, January 2016 with the normal range being 211-924    Past Surgical History:  Procedure Laterality Date  . BACK SURGERY    . CARDIAC CATHETERIZATION  04/2011   right and left cath showing normal right heart pressures,but newly diagnosed coronary artery disease/drug eluting stent placed to RCA with residual disease in the proximal RCA and LAD and ramus, normal  LV function and 60-65% EF  . COLONOSCOPY N/A 09/30/2014   Procedure: COLONOSCOPY;  Surgeon: Najeeb U Rehman, MD;  [facility];  Service: Endoscopy;  Laterality: N/A;  225  . CORONARY ANGIOPLASTY WITH STENT PLACEMENT  04/2011   mid RCA: 3.0 X38 mm Promus DES. Residual 40% disease proximally  . NECK SURGERY      History  Smoking Status  . Former Smoker  . Packs/day: 3.00  . Years: 25.00  . Types: Cigarettes  . Quit date: 07/10/1994  Smokeless Tobacco  . Never Used    History  Alcohol Use No    Family History  Problem Relation Age  of Onset  . Heart failure Mother   . Cancer Father     Review of Systems: As noted in HPI.  All other systems were reviewed and are negative.  Physical Exam: BP 130/70   Pulse 81   Ht 5' 7.5" (1.715 m)   Wt 146 lb (66.2 kg)   BMI 22.53 kg/m  She is a pleasant white female in no acute distress. The HEENT exam is normal. The carotids are 2+ without bruits.  There is no thyromegaly.  There is no JVD.  The lungs are clear.   The heart exam reveals a regular rate with a normal S1 and S2.  There are no murmurs, gallops, or rubs.  The PMI is not displaced.   Abdominal exam reveals good bowel sounds.   There is no hepatosplenomegaly or tenderness.  There are no masses.  Exam of the legs reveal no clubbing, cyanosis, or edema.  The legs are without rashes.  The distal pulses are intact.  Cranial nerves II - XII are intact.  Motor and sensory functions are intact.  The gait is normal.  LABORATORY DATA: Lab Results  Component Value Date   WBC 7.0 12/11/2016   HGB 10.1 (L) 12/11/2016   HCT 30.6 (L) 12/11/2016   PLT 273 12/11/2016   GLUCOSE 103 (H) 12/11/2016   CHOL 156 06/28/2016   TRIG 127 06/28/2016   HDL 61 06/28/2016   LDLCALC 70 06/28/2016   ALT 12 (L) 12/11/2016   AST 25 12/11/2016   NA 141 12/11/2016   K 3.8 12/11/2016   CL 108 12/11/2016   CREATININE 1.64 (H) 12/11/2016   BUN 23 (H) 12/11/2016   CO2 24 12/11/2016   INR 1.04 05/05/2011     Assessment / Plan: 1. Coronary disease status post stenting of the RCA in October of 2012 with a drug-eluting stent.  We will continue aspirin chronically but reduce dose to 81 mg daily.  Continue diltiazem. Normal  stress Myoview study in June 2016. Follow up in 6 months  2. Hypercholesterolemia. Continue Lipitor therapy. Lipids were good in December.    3. COPD with remote history of tobacco use.  4. Hypertension- well controlled currently.  5. CKD stage 3. Creatinine 1.64. Needs to avoid contrast studies if possible due to  myeloma.  6. Multiple myeloma- per oncology.  

## 2017-01-09 ENCOUNTER — Ambulatory Visit (INDEPENDENT_AMBULATORY_CARE_PROVIDER_SITE_OTHER): Payer: Medicare Other | Admitting: Cardiology

## 2017-01-09 ENCOUNTER — Encounter: Payer: Self-pay | Admitting: Cardiology

## 2017-01-09 VITALS — BP 130/70 | HR 81 | Ht 67.5 in | Wt 146.0 lb

## 2017-01-09 DIAGNOSIS — I10 Essential (primary) hypertension: Secondary | ICD-10-CM | POA: Diagnosis not present

## 2017-01-09 DIAGNOSIS — E785 Hyperlipidemia, unspecified: Secondary | ICD-10-CM

## 2017-01-09 DIAGNOSIS — I209 Angina pectoris, unspecified: Secondary | ICD-10-CM

## 2017-01-09 DIAGNOSIS — I25119 Atherosclerotic heart disease of native coronary artery with unspecified angina pectoris: Secondary | ICD-10-CM

## 2017-01-09 DIAGNOSIS — C9000 Multiple myeloma not having achieved remission: Secondary | ICD-10-CM

## 2017-01-09 MED ORDER — ASPIRIN EC 81 MG PO TBEC: 81.0000 mg | DELAYED_RELEASE_TABLET | Freq: Every day | ORAL | 3 refills | Status: DC

## 2017-01-09 NOTE — Patient Instructions (Signed)
Reduce ASA to 81 mg daily  Continue your other therapy  I will see you in 6 months. 

## 2017-01-11 ENCOUNTER — Ambulatory Visit (HOSPITAL_COMMUNITY): Payer: Medicare Other

## 2017-01-11 ENCOUNTER — Other Ambulatory Visit (HOSPITAL_COMMUNITY): Payer: Medicare Other

## 2017-01-12 ENCOUNTER — Encounter (HOSPITAL_COMMUNITY): Payer: Self-pay

## 2017-01-12 ENCOUNTER — Encounter (HOSPITAL_COMMUNITY): Payer: Medicare Other | Attending: Oncology

## 2017-01-12 ENCOUNTER — Encounter (HOSPITAL_BASED_OUTPATIENT_CLINIC_OR_DEPARTMENT_OTHER): Payer: Medicare Other

## 2017-01-12 VITALS — BP 115/46 | HR 81 | Temp 97.7°F | Resp 18 | Wt 147.0 lb

## 2017-01-12 DIAGNOSIS — D801 Nonfamilial hypogammaglobulinemia: Secondary | ICD-10-CM | POA: Diagnosis not present

## 2017-01-12 DIAGNOSIS — N183 Chronic kidney disease, stage 3 unspecified: Secondary | ICD-10-CM

## 2017-01-12 DIAGNOSIS — C9001 Multiple myeloma in remission: Secondary | ICD-10-CM | POA: Insufficient documentation

## 2017-01-12 DIAGNOSIS — C9000 Multiple myeloma not having achieved remission: Secondary | ICD-10-CM

## 2017-01-12 DIAGNOSIS — Z87891 Personal history of nicotine dependence: Secondary | ICD-10-CM | POA: Insufficient documentation

## 2017-01-12 DIAGNOSIS — D631 Anemia in chronic kidney disease: Secondary | ICD-10-CM | POA: Insufficient documentation

## 2017-01-12 DIAGNOSIS — I2510 Atherosclerotic heart disease of native coronary artery without angina pectoris: Secondary | ICD-10-CM | POA: Diagnosis not present

## 2017-01-12 DIAGNOSIS — E785 Hyperlipidemia, unspecified: Secondary | ICD-10-CM | POA: Diagnosis not present

## 2017-01-12 DIAGNOSIS — J449 Chronic obstructive pulmonary disease, unspecified: Secondary | ICD-10-CM | POA: Diagnosis not present

## 2017-01-12 DIAGNOSIS — E538 Deficiency of other specified B group vitamins: Secondary | ICD-10-CM

## 2017-01-12 DIAGNOSIS — I129 Hypertensive chronic kidney disease with stage 1 through stage 4 chronic kidney disease, or unspecified chronic kidney disease: Secondary | ICD-10-CM | POA: Insufficient documentation

## 2017-01-12 DIAGNOSIS — Z9889 Other specified postprocedural states: Secondary | ICD-10-CM | POA: Insufficient documentation

## 2017-01-12 DIAGNOSIS — Z9221 Personal history of antineoplastic chemotherapy: Secondary | ICD-10-CM | POA: Diagnosis not present

## 2017-01-12 LAB — BASIC METABOLIC PANEL
Anion gap: 11 (ref 5–15)
BUN: 30 mg/dL — AB (ref 6–20)
CALCIUM: 8.6 mg/dL — AB (ref 8.9–10.3)
CO2: 25 mmol/L (ref 22–32)
CREATININE: 1.91 mg/dL — AB (ref 0.44–1.00)
Chloride: 101 mmol/L (ref 101–111)
GFR calc non Af Amer: 24 mL/min — ABNORMAL LOW (ref >60)
GFR, EST AFRICAN AMERICAN: 28 mL/min — AB (ref >60)
GLUCOSE: 108 mg/dL — AB (ref 65–99)
Potassium: 3.5 mmol/L (ref 3.5–5.1)
Sodium: 137 mmol/L (ref 135–145)

## 2017-01-12 LAB — CBC
HEMATOCRIT: 29.1 % — AB (ref 36.0–46.0)
Hemoglobin: 9.6 g/dL — ABNORMAL LOW (ref 12.0–15.0)
MCH: 31.3 pg (ref 26.0–34.0)
MCHC: 33 g/dL (ref 30.0–36.0)
MCV: 94.8 fL (ref 78.0–100.0)
Platelets: 246 10*3/uL (ref 150–400)
RBC: 3.07 MIL/uL — ABNORMAL LOW (ref 3.87–5.11)
RDW: 14.2 % (ref 11.5–15.5)
WBC: 8.9 10*3/uL (ref 4.0–10.5)

## 2017-01-12 LAB — HEPATIC FUNCTION PANEL
ALBUMIN: 3.4 g/dL — AB (ref 3.5–5.0)
ALK PHOS: 81 U/L (ref 38–126)
ALT: 12 U/L — ABNORMAL LOW (ref 14–54)
AST: 23 U/L (ref 15–41)
Bilirubin, Direct: <0.1 mg/dL — ABNORMAL LOW (ref 0.1–0.5)
TOTAL PROTEIN: 7.3 g/dL (ref 6.5–8.1)
Total Bilirubin: 0.5 mg/dL (ref 0.3–1.2)

## 2017-01-12 MED ORDER — CYANOCOBALAMIN 1000 MCG/ML IJ SOLN
1000.0000 ug | Freq: Once | INTRAMUSCULAR | Status: AC
  Administered 2017-01-12: 1000 ug via INTRAMUSCULAR
  Filled 2017-01-12: qty 1

## 2017-01-12 MED ORDER — DARBEPOETIN ALFA 60 MCG/0.3ML IJ SOSY
60.0000 ug | PREFILLED_SYRINGE | Freq: Once | INTRAMUSCULAR | Status: AC
  Administered 2017-01-12: 60 ug via SUBCUTANEOUS

## 2017-01-12 MED ORDER — DARBEPOETIN ALFA 60 MCG/0.3ML IJ SOSY
PREFILLED_SYRINGE | INTRAMUSCULAR | Status: AC
  Filled 2017-01-12: qty 0.3

## 2017-01-12 NOTE — Progress Notes (Signed)
Carol Bradley presents today for injection per MD orders. B12 1,013mcg administered IM in left Upper Arm. Administration without incident. Patient tolerated well.   Carol Bradley presents today for injection per MD orders. Aranesp 7mcg administered SQ in left Abdomen. Administration without incident. Patient tolerated well.   Patient presented today to get velcade also, patient had cataract surgery recently with a stitch taken out yesterday. Surgeon requested patient not have chemotherapy for 3 weeks, MD notified and rescheduled her velcade appointment.  Vitals stable and discharged home from clinic ambulatory. Follow up as scheduled.

## 2017-01-12 NOTE — Patient Instructions (Signed)
[place] at [facility] Discharge Instructions  RECOMMENDATIONS MADE BY THE CONSULTANT AND ANY TEST RESULTS WILL BE SENT TO YOUR REFERRING PHYSICIAN.  b12 and aranesp given  Follow up as scheduled.  Thank you for choosing [place] at [facility] to provide your oncology and hematology care.  To afford each patient quality time with our provider, please arrive at least 15 minutes before your scheduled appointment time.    If you have a lab appointment with the [facility] please come in thru the  Main Entrance and check in at the main information desk  You need to re-schedule your appointment should you arrive 10 or more minutes late.  We strive to give you quality time with our providers, and arriving late affects you and other patients whose appointments are after yours.  Also, if you no show three or more times for appointments you may be dismissed from the clinic at the providers discretion.     Again, thank you for choosing [facility].  Our hope is that these requests will decrease the amount of time that you wait before being seen by our physicians.       _____________________________________________________________  Should you have questions after your visit to [facility], please contact our office at [phone] between the hours of 8:30 a.m. and 4:30 p.m.  Voicemails left after 4:30 p.m. will not be returned until the following business day.  For prescription refill requests, have your pharmacy contact our office.       Resources For Cancer Patients and their Caregivers ? American Cancer Society: Can assist with transportation, wigs, general needs, runs Look Good Feel Better.        3070386393 ? Cancer Care: Provides financial assistance, online support groups, medication/co-pay assistance.  1-800-813-HOPE 718-632-8359) ? [place] Assists [place] cancer patients and  their families through emotional , educational and financial support.  740-059-3078 ? [place] DSS Where to apply for food stamps, Medicaid and utility assistance. (281)201-1297 ? RCATS: Transportation to medical appointments. 780-113-0912 ? Social Security Administration: May apply for disability if have a Stage IV cancer. (620) 723-9670 828-800-2932 ? [employer], Disability and Transit Services: Assists with nutrition, care and transit needs. [place] Support Programs: @10RELATIVEDAYS @ > Cancer Support Group  2nd Tuesday of the month 1pm-2pm, Journey Room  > Creative Journey  3rd Tuesday of the month 1130am-1pm, Journey Room  > Look Good Feel Better  1st Wednesday of the month 10am-12 noon, Journey Room (Call [facility] to register [phone])

## 2017-01-25 ENCOUNTER — Other Ambulatory Visit (HOSPITAL_COMMUNITY): Payer: Medicare Other

## 2017-01-25 ENCOUNTER — Ambulatory Visit (HOSPITAL_COMMUNITY): Payer: Medicare Other

## 2017-01-26 ENCOUNTER — Ambulatory Visit (HOSPITAL_COMMUNITY): Payer: Medicare Other

## 2017-01-26 ENCOUNTER — Other Ambulatory Visit (HOSPITAL_COMMUNITY): Payer: Medicare Other

## 2017-02-02 ENCOUNTER — Encounter (HOSPITAL_COMMUNITY): Payer: Medicare Other

## 2017-02-02 ENCOUNTER — Ambulatory Visit (HOSPITAL_COMMUNITY): Payer: Medicare Other

## 2017-02-02 DIAGNOSIS — E538 Deficiency of other specified B group vitamins: Secondary | ICD-10-CM

## 2017-02-02 DIAGNOSIS — D631 Anemia in chronic kidney disease: Secondary | ICD-10-CM | POA: Diagnosis not present

## 2017-02-02 DIAGNOSIS — N183 Chronic kidney disease, stage 3 unspecified: Secondary | ICD-10-CM

## 2017-02-02 DIAGNOSIS — D801 Nonfamilial hypogammaglobulinemia: Secondary | ICD-10-CM

## 2017-02-02 LAB — BASIC METABOLIC PANEL
ANION GAP: 8 (ref 5–15)
BUN: 25 mg/dL — AB (ref 6–20)
CALCIUM: 8.9 mg/dL (ref 8.9–10.3)
CO2: 26 mmol/L (ref 22–32)
Chloride: 105 mmol/L (ref 101–111)
Creatinine, Ser: 1.53 mg/dL — ABNORMAL HIGH (ref 0.44–1.00)
GFR calc Af Amer: 37 mL/min — ABNORMAL LOW (ref >60)
GFR, EST NON AFRICAN AMERICAN: 32 mL/min — AB (ref >60)
GLUCOSE: 101 mg/dL — AB (ref 65–99)
POTASSIUM: 4.3 mmol/L (ref 3.5–5.1)
SODIUM: 139 mmol/L (ref 135–145)

## 2017-02-02 LAB — SAMPLE TO BLOOD BANK

## 2017-02-05 ENCOUNTER — Encounter (HOSPITAL_BASED_OUTPATIENT_CLINIC_OR_DEPARTMENT_OTHER): Payer: Medicare Other

## 2017-02-05 ENCOUNTER — Encounter (HOSPITAL_COMMUNITY): Payer: Self-pay

## 2017-02-05 VITALS — BP 131/53 | HR 70 | Temp 97.7°F | Resp 18 | Wt 148.8 lb

## 2017-02-05 DIAGNOSIS — Z5112 Encounter for antineoplastic immunotherapy: Secondary | ICD-10-CM

## 2017-02-05 DIAGNOSIS — E538 Deficiency of other specified B group vitamins: Secondary | ICD-10-CM

## 2017-02-05 DIAGNOSIS — C9001 Multiple myeloma in remission: Secondary | ICD-10-CM

## 2017-02-05 DIAGNOSIS — D801 Nonfamilial hypogammaglobulinemia: Secondary | ICD-10-CM

## 2017-02-05 DIAGNOSIS — D631 Anemia in chronic kidney disease: Secondary | ICD-10-CM | POA: Diagnosis not present

## 2017-02-05 DIAGNOSIS — C9000 Multiple myeloma not having achieved remission: Secondary | ICD-10-CM

## 2017-02-05 DIAGNOSIS — N183 Chronic kidney disease, stage 3 (moderate): Secondary | ICD-10-CM

## 2017-02-05 LAB — COMPREHENSIVE METABOLIC PANEL
ALT: 11 U/L — AB (ref 14–54)
AST: 21 U/L (ref 15–41)
Albumin: 3.3 g/dL — ABNORMAL LOW (ref 3.5–5.0)
Alkaline Phosphatase: 75 U/L (ref 38–126)
Anion gap: 9 (ref 5–15)
BUN: 20 mg/dL (ref 6–20)
CHLORIDE: 102 mmol/L (ref 101–111)
CO2: 25 mmol/L (ref 22–32)
CREATININE: 1.39 mg/dL — AB (ref 0.44–1.00)
Calcium: 8.8 mg/dL — ABNORMAL LOW (ref 8.9–10.3)
GFR, EST AFRICAN AMERICAN: 42 mL/min — AB (ref >60)
GFR, EST NON AFRICAN AMERICAN: 36 mL/min — AB (ref >60)
Glucose, Bld: 100 mg/dL — ABNORMAL HIGH (ref 65–99)
Potassium: 4 mmol/L (ref 3.5–5.1)
Sodium: 136 mmol/L (ref 135–145)
Total Bilirubin: 0.6 mg/dL (ref 0.3–1.2)
Total Protein: 7.2 g/dL (ref 6.5–8.1)

## 2017-02-05 LAB — CBC
HCT: 28 % — ABNORMAL LOW (ref 36.0–46.0)
HEMOGLOBIN: 9.2 g/dL — AB (ref 12.0–15.0)
MCH: 30.5 pg (ref 26.0–34.0)
MCHC: 32.9 g/dL (ref 30.0–36.0)
MCV: 92.7 fL (ref 78.0–100.0)
Platelets: 294 10*3/uL (ref 150–400)
RBC: 3.02 MIL/uL — ABNORMAL LOW (ref 3.87–5.11)
RDW: 13.6 % (ref 11.5–15.5)
WBC: 8.5 10*3/uL (ref 4.0–10.5)

## 2017-02-05 LAB — SAMPLE TO BLOOD BANK

## 2017-02-05 MED ORDER — SODIUM CHLORIDE 0.9 % IV SOLN
3.0000 mg | Freq: Once | INTRAVENOUS | Status: AC
  Administered 2017-02-05: 3 mg via INTRAVENOUS
  Filled 2017-02-05: qty 3.75

## 2017-02-05 MED ORDER — SODIUM CHLORIDE 0.9 % IV SOLN
INTRAVENOUS | Status: DC
  Administered 2017-02-05: 13:00:00 via INTRAVENOUS

## 2017-02-05 MED ORDER — PROCHLORPERAZINE MALEATE 10 MG PO TABS
ORAL_TABLET | ORAL | Status: AC
  Filled 2017-02-05: qty 1

## 2017-02-05 MED ORDER — PROCHLORPERAZINE MALEATE 10 MG PO TABS
10.0000 mg | ORAL_TABLET | Freq: Once | ORAL | Status: AC
  Administered 2017-02-05: 10 mg via ORAL

## 2017-02-05 MED ORDER — BORTEZOMIB CHEMO SQ INJECTION 3.5 MG (2.5MG/ML)
1.0000 mg/m2 | Freq: Once | INTRAMUSCULAR | Status: AC
  Administered 2017-02-05: 1.75 mg via SUBCUTANEOUS
  Filled 2017-02-05: qty 1.75

## 2017-02-05 MED ORDER — DIAZEPAM 5 MG PO TABS: 5.0000 mg | ORAL_TABLET | Freq: Four times a day (QID) | ORAL | 0 refills | Status: DC | PRN

## 2017-02-05 NOTE — Patient Instructions (Signed)
[place] at [facility] Discharge Instructions  RECOMMENDATIONS MADE BY THE CONSULTANT AND ANY TEST RESULTS WILL BE SENT TO YOUR REFERRING PHYSICIAN.  You received your velcade injection today and your Zometa infusion You Hgb is 9.2 so you do no need a blood transfusion.  Thank you for choosing [place] at [facility] to provide your oncology and hematology care.  To afford each patient quality time with our provider, please arrive at least 15 minutes before your scheduled appointment time.    If you have a lab appointment with the [place] please come in thru the  Main Entrance and check in at the main information desk  You need to re-schedule your appointment should you arrive 10 or more minutes late.  We strive to give you quality time with our providers, and arriving late affects you and other patients whose appointments are after yours.  Also, if you no show three or more times for appointments you may be dismissed from the clinic at the providers discretion.     Again, thank you for choosing [facility].  Our hope is that these requests will decrease the amount of time that you wait before being seen by our physicians.       _____________________________________________________________  Should you have questions after your visit to [facility], please contact our office at [phone] between the hours of 8:30 a.m. and 4:30 p.m.  Voicemails left after 4:30 p.m. will not be returned until the following business day.  For prescription refill requests, have your pharmacy contact our office.       Resources For Cancer Patients and their Caregivers ? American Cancer Society: Can assist with transportation, wigs, general needs, runs Look Good Feel Better.        (727)219-1548 ? Cancer Care: Provides financial assistance, online support groups, medication/co-pay assistance.  1-800-813-HOPE 614 525 1482) ? [place] Assists [place] cancer patients and their families through emotional , educational and financial support.  (925)003-1151 ? [place] DSS Where to apply for food stamps, Medicaid and utility assistance. (281) 789-8309 ? RCATS: Transportation to medical appointments. (724)208-7484 ? Social Security Administration: May apply for disability if have a Stage IV cancer. 603-377-7207 (805)159-4718 ? [employer], Disability and Transit Services: Assists with nutrition, care and transit needs. [place] Support Programs: @10RELATIVEDAYS @ > Cancer Support Group  2nd Tuesday of the month 1pm-2pm, Journey Room  > Creative Journey  3rd Tuesday of the month 1130am-1pm, Journey Room  > Look Good Feel Better  1st Wednesday of the month 10am-12 noon, Journey Room (Call [place] to register [phone])

## 2017-02-05 NOTE — Progress Notes (Signed)
Patient presented today to have zometa infusion and velcade injection per MD orders.  Patient tolerated zometa infusion without incidence.  Patient received Velcade 1.75 mg administered SQ in left lower abdomen. Administration without incident. Patient tolerated well.  Patient discharged ambulatory and in stable condition from clinic to self. Patient to follow up as scheduled.

## 2017-02-06 ENCOUNTER — Other Ambulatory Visit (HOSPITAL_COMMUNITY): Payer: Self-pay | Admitting: Oncology

## 2017-02-08 ENCOUNTER — Encounter (HOSPITAL_COMMUNITY): Payer: Self-pay | Admitting: Adult Health

## 2017-02-08 ENCOUNTER — Encounter (HOSPITAL_BASED_OUTPATIENT_CLINIC_OR_DEPARTMENT_OTHER): Payer: Medicare Other | Admitting: Adult Health

## 2017-02-08 ENCOUNTER — Encounter (HOSPITAL_COMMUNITY): Payer: Medicare Other | Attending: Oncology

## 2017-02-08 ENCOUNTER — Encounter (HOSPITAL_COMMUNITY): Payer: Medicare Other

## 2017-02-08 VITALS — BP 109/50 | HR 71 | Temp 97.9°F | Resp 18 | Ht 67.5 in | Wt 148.0 lb

## 2017-02-08 VITALS — BP 109/50 | HR 71 | Temp 97.9°F | Resp 18 | Wt 148.2 lb

## 2017-02-08 DIAGNOSIS — C9000 Multiple myeloma not having achieved remission: Secondary | ICD-10-CM | POA: Diagnosis present

## 2017-02-08 DIAGNOSIS — Z9889 Other specified postprocedural states: Secondary | ICD-10-CM | POA: Insufficient documentation

## 2017-02-08 DIAGNOSIS — E538 Deficiency of other specified B group vitamins: Secondary | ICD-10-CM | POA: Diagnosis not present

## 2017-02-08 DIAGNOSIS — E785 Hyperlipidemia, unspecified: Secondary | ICD-10-CM | POA: Insufficient documentation

## 2017-02-08 DIAGNOSIS — I129 Hypertensive chronic kidney disease with stage 1 through stage 4 chronic kidney disease, or unspecified chronic kidney disease: Secondary | ICD-10-CM | POA: Insufficient documentation

## 2017-02-08 DIAGNOSIS — Z1231 Encounter for screening mammogram for malignant neoplasm of breast: Secondary | ICD-10-CM

## 2017-02-08 DIAGNOSIS — Z9221 Personal history of antineoplastic chemotherapy: Secondary | ICD-10-CM | POA: Diagnosis not present

## 2017-02-08 DIAGNOSIS — N183 Chronic kidney disease, stage 3 unspecified: Secondary | ICD-10-CM

## 2017-02-08 DIAGNOSIS — Z87891 Personal history of nicotine dependence: Secondary | ICD-10-CM | POA: Diagnosis not present

## 2017-02-08 DIAGNOSIS — I2510 Atherosclerotic heart disease of native coronary artery without angina pectoris: Secondary | ICD-10-CM | POA: Insufficient documentation

## 2017-02-08 DIAGNOSIS — D631 Anemia in chronic kidney disease: Secondary | ICD-10-CM

## 2017-02-08 DIAGNOSIS — C9001 Multiple myeloma in remission: Secondary | ICD-10-CM | POA: Insufficient documentation

## 2017-02-08 DIAGNOSIS — J449 Chronic obstructive pulmonary disease, unspecified: Secondary | ICD-10-CM | POA: Diagnosis not present

## 2017-02-08 DIAGNOSIS — D801 Nonfamilial hypogammaglobulinemia: Secondary | ICD-10-CM

## 2017-02-08 LAB — CBC WITH DIFFERENTIAL/PLATELET
BASOS ABS: 0 10*3/uL (ref 0.0–0.1)
Basophils Relative: 0 %
Eosinophils Absolute: 0.1 10*3/uL (ref 0.0–0.7)
Eosinophils Relative: 1 %
HEMATOCRIT: 27.9 % — AB (ref 36.0–46.0)
HEMOGLOBIN: 9.1 g/dL — AB (ref 12.0–15.0)
LYMPHS PCT: 15 %
Lymphs Abs: 1.4 10*3/uL (ref 0.7–4.0)
MCH: 30.2 pg (ref 26.0–34.0)
MCHC: 32.6 g/dL (ref 30.0–36.0)
MCV: 92.7 fL (ref 78.0–100.0)
MONO ABS: 0.8 10*3/uL (ref 0.1–1.0)
Monocytes Relative: 9 %
NEUTROS ABS: 6.9 10*3/uL (ref 1.7–7.7)
NEUTROS PCT: 75 %
Platelets: 293 10*3/uL (ref 150–400)
RBC: 3.01 MIL/uL — AB (ref 3.87–5.11)
RDW: 13.8 % (ref 11.5–15.5)
WBC: 9.2 10*3/uL (ref 4.0–10.5)

## 2017-02-08 LAB — COMPREHENSIVE METABOLIC PANEL
ALK PHOS: 79 U/L (ref 38–126)
ALT: 12 U/L — AB (ref 14–54)
AST: 21 U/L (ref 15–41)
Albumin: 3.2 g/dL — ABNORMAL LOW (ref 3.5–5.0)
Anion gap: 9 (ref 5–15)
BILIRUBIN TOTAL: 0.7 mg/dL (ref 0.3–1.2)
BUN: 22 mg/dL — AB (ref 6–20)
CO2: 25 mmol/L (ref 22–32)
CREATININE: 1.46 mg/dL — AB (ref 0.44–1.00)
Calcium: 9 mg/dL (ref 8.9–10.3)
Chloride: 103 mmol/L (ref 101–111)
GFR calc Af Amer: 39 mL/min — ABNORMAL LOW (ref >60)
GFR, EST NON AFRICAN AMERICAN: 34 mL/min — AB (ref >60)
GLUCOSE: 100 mg/dL — AB (ref 65–99)
Potassium: 4.1 mmol/L (ref 3.5–5.1)
Sodium: 137 mmol/L (ref 135–145)
TOTAL PROTEIN: 7.2 g/dL (ref 6.5–8.1)

## 2017-02-08 LAB — SAMPLE TO BLOOD BANK

## 2017-02-08 MED ORDER — CYANOCOBALAMIN 1000 MCG/ML IJ SOLN
1000.0000 ug | Freq: Once | INTRAMUSCULAR | Status: AC
  Administered 2017-02-08: 1000 ug via INTRAMUSCULAR

## 2017-02-08 MED ORDER — DARBEPOETIN ALFA 60 MCG/0.3ML IJ SOSY
60.0000 ug | PREFILLED_SYRINGE | Freq: Once | INTRAMUSCULAR | Status: AC
  Administered 2017-02-08: 60 ug via SUBCUTANEOUS

## 2017-02-08 MED ORDER — CYANOCOBALAMIN 1000 MCG/ML IJ SOLN
INTRAMUSCULAR | Status: AC
  Filled 2017-02-08: qty 1

## 2017-02-08 MED ORDER — DARBEPOETIN ALFA 60 MCG/0.3ML IJ SOSY
PREFILLED_SYRINGE | INTRAMUSCULAR | Status: AC
  Filled 2017-02-08: qty 0.3

## 2017-02-08 NOTE — Patient Instructions (Signed)
[facility] at [facility] Discharge Instructions  RECOMMENDATIONS MADE BY THE CONSULTANT AND ANY TEST RESULTS WILL BE SENT TO YOUR REFERRING PHYSICIAN.  Received Vit B12 and Aranesp injections today. Follow-up as scheduled. Call clinic for any questions or concerns  Thank you for choosing [facility] at [facility] to provide your oncology and hematology care.  To afford each patient quality time with our provider, please arrive at least 15 minutes before your scheduled appointment time.    If you have a lab appointment with the Cancer Center please come in thru the  Main Entrance and check in at the main information desk  You need to re-schedule your appointment should you arrive 10 or more minutes late.  We strive to give you quality time with our providers, and arriving late affects you and other patients whose appointments are after yours.  Also, if you no show three or more times for appointments you may be dismissed from the clinic at the providers discretion.     Again, thank you for choosing [facility].  Our hope is that these requests will decrease the amount of time that you wait before being seen by our physicians.       _____________________________________________________________  Should you have questions after your visit to [facility], please contact our office at [phone] between the hours of 8:30 a.m. and 4:30 p.m.  Voicemails left after 4:30 p.m. will not be returned until the following business day.  For prescription refill requests, have your pharmacy contact our office.       Resources For Cancer Patients and their Caregivers ? American Cancer Society: Can assist with transportation, wigs, general needs, runs Look Good Feel Better.        1-888-227-6333 ? Cancer Care: Provides financial assistance, online support groups, medication/co-pay assistance.  1-800-813-HOPE (4673) ? [facility] Assists [place] cancer patients and their families through emotional , educational and financial support.  336-427-4357 ? [place] DSS Where to apply for food stamps, Medicaid and utility assistance. 336-342-1394 ? RCATS: Transportation to medical appointments. 336-347-2287 ? Social Security Administration: May apply for disability if have a Stage IV cancer. 336-342-7796 1-800-772-1213 ? [place] Aging, Disability and Transit Services: Assists with nutrition, care and transit needs. 336-349-2343  Cancer Center Support Programs: @10RELATIVEDAYS@ > Cancer Support Group  2nd Tuesday of the month 1pm-2pm, Journey Room  > Creative Journey  3rd Tuesday of the month 1130am-1pm, Journey Room  > Look Good Feel Better  1st Wednesday of the month 10am-12 noon, Journey Room (Call American [employer] to register 1-800-395-5775)   

## 2017-02-08 NOTE — Patient Instructions (Addendum)
[place] at [facility] Discharge Instructions  RECOMMENDATIONS MADE BY THE CONSULTANT AND ANY TEST RESULTS WILL BE SENT TO YOUR REFERRING PHYSICIAN.  You were seen today by Mike Craze NP. Continue monthly B12 injections. Continue Aranesp and Velcade injections every 2 weeks. Continue Zometa every 3 months.  Mammogram in the next couple months. Return in 2 months for follow up.     Thank you for choosing [place] at [facility] to provide your oncology and hematology care.  To afford each patient quality time with our provider, please arrive at least 15 minutes before your scheduled appointment time.    If you have a lab appointment with the [place] please come in thru the  Main Entrance and check in at the main information desk  You need to re-schedule your appointment should you arrive 10 or more minutes late.  We strive to give you quality time with our providers, and arriving late affects you and other patients whose appointments are after yours.  Also, if you no show three or more times for appointments you may be dismissed from the clinic at the providers discretion.     Again, thank you for choosing [facility].  Our hope is that these requests will decrease the amount of time that you wait before being seen by our physicians.       _____________________________________________________________  Should you have questions after your visit to [facility], please contact our office at [phone] between the hours of 8:30 a.m. and 4:30 p.m.  Voicemails left after 4:30 p.m. will not be returned until the following business day.  For prescription refill requests, have your pharmacy contact our office.       Resources For Cancer Patients and their Caregivers ? American Cancer Society: Can assist with transportation, wigs, general needs, runs Look Good Feel Better.        (725)262-9728 ? Cancer  Care: Provides financial assistance, online support groups, medication/co-pay assistance.  1-800-813-HOPE (218)630-0225) ? [place] Assists [place] cancer patients and their families through emotional , educational and financial support.  (787)500-7963 ? [place] DSS Where to apply for food stamps, Medicaid and utility assistance. (312)420-8467 ? RCATS: Transportation to medical appointments. 660-719-6433 ? Social Security Administration: May apply for disability if have a Stage IV cancer. (435)127-4791 765-286-5336 ? [employer], Disability and Transit Services: Assists with nutrition, care and transit needs. [place] Support Programs: @10RELATIVEDAYS @ > Cancer Support Group  2nd Tuesday of the month 1pm-2pm, Journey Room  > Creative Journey  3rd Tuesday of the month 1130am-1pm, Journey Room  > Look Good Feel Better  1st Wednesday of the month 10am-12 noon, Journey Room (Call [place] to register [phone])

## 2017-02-08 NOTE — Progress Notes (Signed)
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       03/18/2015 - 07/28/2015 Chemotherapy    Velcade 1.6 mg/m2 discontinued secondary to intolerance      07/28/2015 Adverse Reaction    stool incontinence, weakness, collapse upon standing, felt to be secondary to velcade      11/09/2015 - 12/13/2015 Chemotherapy    cytoxan IV 300 mg/m2 administered X 2 doses only in addition to rev/dex      11/09/2015 - 06/08/2016 Chemotherapy    Revlimid/Dexamethasone       02/05/2017 -  Chemotherapy    The patient had bortezomib SQ (VELCADE) chemo injection 1.75 mg, 1 mg/m2 = 1.75 mg (66.7 % of original dose 1.5 mg/m2), Subcutaneous,  Once, 1 of 8 cycles Dose modification: 1.5 mg/m2 (original dose 1.5 mg/m2, Cycle 1, Reason: Provider Judgment, Comment: Per Dr. Norma Fredrickson recommendations from [facility] 1 mg/m2 (original dose 1.5 mg/m2, Cycle 1, Reason: Provider Judgment)  for chemotherapy treatment.           INTERVAL HISTORY:  Ms. Dodgen 76 y.o. female returns for routine follow-up for multiple myeloma, anemia d/t CKD, and vitamin B12 deficiency.   Overall, she tells me that she feels "pretty good, just really tired." Reports having no energy.  Appetite about 50%; she supplements with Ensure daily.  Received her first dose of Velcade on 02/05/17 and has  tolerated well with no side effects thus far.  Denies any new bone pain, rash, diarrhea, N&V, or bleeding.  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  Reports occasional dizziness, which she attributes to "my sinuses being stopped up. It goes away on its own."  She recently had bilateral cataract surgery; she was noted to have a darkened area on her (L) upper eyelid, so she has been referred to a specialist in [facility] for possible biopsy of this area.  She does endorses easy bruising.   She is here today for next dose of Aranesp and B12 injection today.     REVIEW OF SYSTEMS:  Review of Systems  Constitutional: Positive for appetite change and fatigue. Negative for chills and fever.  HENT:  Negative.  Negative for lump/mass and nosebleeds.   Eyes: Negative.   Respiratory: Negative.  Negative for cough and shortness of breath.   Cardiovascular: Negative.  Negative for chest pain and leg swelling.  Gastrointestinal: Negative.  Negative for abdominal pain, blood in stool, constipation, diarrhea, nausea and vomiting.  Endocrine: Negative.   Genitourinary: Negative.  Negative for dysuria and hematuria.   Musculoskeletal: Negative.  Negative for arthralgias.  Skin: Negative.  Negative for rash.  Neurological: Positive  for dizziness and numbness. Negative for headaches.  Hematological: Negative for adenopathy. Bruises/bleeds easily.  Psychiatric/Behavioral: Negative.  Negative for depression and sleep disturbance. The patient is not nervous/anxious.      PAST MEDICAL/SURGICAL HISTORY:  Past Medical History:  Diagnosis Date  . 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 CAD (coronary artery disease)   . COPD (chronic obstructive pulmonary disease) ([place])   . History of tobacco  abuse   . Hyperlipidemia   . Hypertension   . 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 Multiple myeloma ([place])   . Vitamin B12 deficiency 04/15/2016   Overview:  Vitamin B12 level documented 155, January 2016 with the normal range being 211-924   Past Surgical History:  Procedure Laterality Date  . BACK SURGERY    . CARDIAC CATHETERIZATION  04/2011   right and left cath showing normal right heart pressures,but newly diagnosed coronary artery disease/drug eluting stent placed to RCA with residual disease in the proximal RCA and LAD and ramus, normal LV function and 60-65% EF  . COLONOSCOPY N/A 09/30/2014   Procedure: COLONOSCOPY;  Surgeon: Rogene Houston, MD;  Location: [facility];  Service: Endoscopy;  Laterality: N/A;  225  . CORONARY ANGIOPLASTY WITH STENT PLACEMENT  04/2011   mid RCA: 3.0 X38 mm Promus DES. Residual 40% disease proximally  . NECK SURGERY       SOCIAL HISTORY:  Social History   Social History  . Marital status: Married    Spouse name: N/A  . Number of children: N/A  . Years of education: N/A   Occupational History  . 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 Smoking status: Former Smoker    Packs/day: 3.00    Years: 25.00    Types: Cigarettes    Quit date: 07/10/1994  . Smokeless tobacco: Never Used  . Alcohol use No  . Drug use: Unknown  . Sexual activity: Not on file   Other Topics Concern  . Not on file   Social History Narrative  . No narrative on file    FAMILY HISTORY:  Family History  Problem Relation Age of Onset  . Heart failure Mother   . Cancer Father     CURRENT MEDICATIONS:  Outpatient Encounter Prescriptions as of 02/08/2017  Medication Sig  . acyclovir (ZOVIRAX) 200 MG capsule TAKE ONE CAPSULE BY MOUTH TWICE DAILY  . aspirin EC 81 MG tablet Take 1 tablet (81 mg total) by mouth daily.  Marland Kitchen atorvastatin (LIPITOR) 10 MG tablet TAKE ONE TABLET BY MOUTH DAILY  . Calcium Carbonate-Vit D-Min (CALCIUM 600+D  PLUS MINERALS) 600-400 MG-UNIT CHEW Chew 2 tablets by mouth daily.  . diazepam (VALIUM) 5 MG tablet Take 1 tablet (5 mg total) by mouth every 6 (six) hours as needed for anxiety.  Marland Kitchen diltiazem (CARDIZEM CD) 240 MG 24 hr capsule TAKE ONE CAPSULE BY MOUTH DAILY  . losartan (COZAAR) 100 MG tablet TAKE ONE TABLET BY MOUTH DAILY  . magnesium oxide (MAG-OX) 400 (241.3 Mg) MG tablet TAKE ONE TABLET BY MOUTH TWICE DAILY. WHEN TAKING LASIX  . metoprolol tartrate (LOPRESSOR) 25 MG tablet TAKE ONE TABLET BY MOUTH TWICE DAILY  . potassium chloride (K-DUR,KLOR-CON) 10 MEQ tablet Take 1 tablet (10 mEq total) by mouth 3 (three) times daily. When taking lasix (Patient taking differently: Take 10 mEq by mouth 2 (two) times daily. When taking lasix )  . prochlorperazine (COMPAZINE) 10 MG tablet Take 10  mg by mouth every 6 (six) hours as needed for nausea or vomiting.  . [EXPIRED] cyanocobalamin ((VITAMIN B-12)) injection 1,000 mcg    No facility-administered encounter medications on file as of 02/08/2017.     ALLERGIES:  Allergies  Allergen Reactions  . Bortezomib Other (See Comments)    Incontinence of stool with rectal sphincter dysfunction  . Lisinopril Cough  . Plavix [Clopidogrel Bisulfate] Swelling     PHYSICAL EXAM:  ECOG Performance status: 1 - Symptomatic; independent   Vitals:   02/08/17 1349  BP: (!) 109/50  Pulse: 71  Resp: 18  Temp: 97.9 F (36.6 C)   Filed Weights   02/08/17 1349  Weight: 148 lb (67.1 kg)    Physical Exam  Constitutional: She is oriented to person, place, and time and well-developed, well-nourished, and in no distress.  HENT:  Head: Normocephalic.  Mouth/Throat: Oropharynx is clear and moist. No oropharyngeal exudate.  Eyes: Pupils are equal, round, and reactive to light. Conjunctivae are normal. No scleral icterus.  Neck: Normal range of motion. Neck supple.  Cardiovascular: Normal rate and regular rhythm.   Pulmonary/Chest: Effort normal and breath sounds  normal. No respiratory distress. She has no wheezes.  Abdominal: Soft. Bowel sounds are normal. There is no tenderness. There is no rebound.  Musculoskeletal: Normal range of motion. She exhibits no edema.  Lymphadenopathy:    She has no cervical adenopathy.       Right: No supraclavicular adenopathy present.       Left: No supraclavicular adenopathy present.  Neurological: She is alert and oriented to person, place, and time. No cranial nerve deficit. Gait normal.  Skin: Skin is warm and dry. No rash noted.  Psychiatric: Mood, memory, affect and judgment normal.  Nursing note and vitals reviewed.    LABORATORY DATA:  I have reviewed the labs as listed.  CBC    Component Value Date/Time   WBC 9.2 02/08/2017 1231   RBC 3.01 (L) 02/08/2017 1231   HGB 9.1 (L) 02/08/2017 1231   HCT 27.9 (L) 02/08/2017 1231   PLT 293 02/08/2017 1231   MCV 92.7 02/08/2017 1231   MCH 30.2 02/08/2017 1231   MCHC 32.6 02/08/2017 1231   RDW 13.8 02/08/2017 1231   LYMPHSABS 1.4 02/08/2017 1231   MONOABS 0.8 02/08/2017 1231   EOSABS 0.1 02/08/2017 1231   BASOSABS 0.0 02/08/2017 1231   CMP Latest Ref Rng & Units 02/08/2017 02/05/2017 02/02/2017  Glucose 65 - 99 mg/dL 100(H) 100(H) 101(H)  BUN 6 - 20 mg/dL 22(H) 20 25(H)  Creatinine 0.44 - 1.00 mg/dL 1.46(H) 1.39(H) 1.53(H)  Sodium 135 - 145 mmol/L 137 136 139  Potassium 3.5 - 5.1 mmol/L 4.1 4.0 4.3  Chloride 101 - 111 mmol/L 103 102 105  CO2 22 - 32 mmol/L '25 25 26  '$ Calcium 8.9 - 10.3 mg/dL 9.0 8.8(L) 8.9  Total Protein 6.5 - 8.1 g/dL 7.2 7.2 -  Total Bilirubin 0.3 - 1.2 mg/dL 0.7 0.6 -  Alkaline Phos 38 - 126 U/L 79 75 -  AST 15 - 41 U/L 21 21 -  ALT 14 - 54 U/L 12(L) 11(L) -     Results for TRIVIA, HEFFELFINGER (MRN [id number])   Ref. Range 02/08/2017 12:31  Beta-2 Microglobulin Latest Ref Range: 0.6 - 2.4 mg/L 4.2 (H)  Kappa free light chain Latest Ref Range: 3.3 - 19.4 mg/L 48.3 (H)  Lamda free light chains Latest Ref Range: 5.7 - 26.3 mg/L 32.3 (H)    Kappa, lamda  light chain ratio Latest Ref Range: 0.26 - 1.65  1.50  Results for KENNI, NEWTON (MRN [id number]) as of 02/10/2017 15:11  Ref. Range 12/11/2016 09:05  Total Protein ELP Latest Ref Range: 6.0 - 8.5 g/dL 7.0  Albumin ELP Latest Ref Range: 2.9 - 4.4 g/dL 3.3  Globulin, Total Latest Ref Range: 2.2 - 3.9 g/dL 3.7  A/G Ratio Latest Ref Range: 0.7 - 1.7  0.9  Alpha-1-Globulin Latest Ref Range: 0.0 - 0.4 g/dL 0.3  Alpha-2-Globulin Latest Ref Range: 0.4 - 1.0 g/dL 1.0  Beta Globulin Latest Ref Range: 0.7 - 1.3 g/dL 1.0  Gamma Globulin Latest Ref Range: 0.4 - 1.8 g/dL 1.4  M-SPIKE, % Latest Ref Range: Not Observed g/dL Not Observed    PENDING LABS:    DIAGNOSTIC IMAGING:    PATHOLOGY:  Bone marrow biopsy cytogenetics: 03/04/15         ASSESSMENT & PLAN:   IgG kappa multiple myeloma (in remission):  -Not a stem cell transplant candidate d/t poor bone marrow reserve per Dr. Norma Fredrickson at [facility] He recommended maintenance Velcade every 15 days.   -Based on Dr. Rossie Muskrat recommendations, no need for monthly IVIG for hypogammaglobulinemia. No reported recent infections. -Most recent kappa/lambda light chain ratio normal at 1.5. M-spike remains not detectable.  -Continue Velcade twice monthly.  -Return to cancer center in 2 months for follow-up.   Bone health:  -Continue Zometa every 3 months. -Next dose will be due in 04/2017.  Oncology Flowsheet 02/05/2017  zolendronic acid (ZOMETA) IV 3 mg     Vitamin B12 deficiency:  -Continue monthly B12 injections.   Anemia d/t chronic kidney disease:  -Tolerating Aranesp well.  Hgb 9.1 g/dL. Currently receiving Aranesp every 4 weeks because she has wonderful initial response.  If Hgb continues to decrease, will modify Aranesp injections to every 2-3 weeks.   Health maintenance:  -It has reportedly been several years since her last mammogram. Discussed the importance of annual mammography in  screening for breast cancer.  She agreed to have mammogram sometime before her next follow-up here at the cancer center; orders placed today.        Dispo:  -Continue monthly B12 and Aranesp injections.  -Continue maintenance Velcade every 15 days -Continue Zometa every 3 months  -Screening mammogram sometime within the next 2 months; orders placed today.  -Return to cancer center in 2 months for follow-up.    All questions were answered to patient's stated satisfaction. Encouraged patient to call with any new concerns or questions before her next visit to the cancer center and we can certain see her sooner, if needed.    Plan of care discussed with Dr. Talbert Cage, who agrees with the above aforementioned.    Orders placed this encounter:  Orders Placed This Encounter  Procedures  . MM SCREENING BREAST TOMO BILATERAL      Mike Craze, NP [place] [phone]

## 2017-02-08 NOTE — Progress Notes (Signed)
Carol Bradley tolerated Vit B12 and Aranesp injections well without complaints or incident. Hgb 9.1 today. VSS Pt discharged in satisfactory condition

## 2017-02-09 LAB — BETA 2 MICROGLOBULIN, SERUM: BETA 2 MICROGLOBULIN: 4.2 mg/L — AB (ref 0.6–2.4)

## 2017-02-09 LAB — KAPPA/LAMBDA LIGHT CHAINS
Kappa free light chain: 48.3 mg/L — ABNORMAL HIGH (ref 3.3–19.4)
Kappa, lambda light chain ratio: 1.5 (ref 0.26–1.65)
Lambda free light chains: 32.3 mg/L — ABNORMAL HIGH (ref 5.7–26.3)

## 2017-02-13 LAB — MULTIPLE MYELOMA PANEL, SERUM
ALBUMIN/GLOB SERPL: 1 (ref 0.7–1.7)
ALPHA2 GLOB SERPL ELPH-MCNC: 0.9 g/dL (ref 0.4–1.0)
Albumin SerPl Elph-Mcnc: 3.2 g/dL (ref 2.9–4.4)
Alpha 1: 0.3 g/dL (ref 0.0–0.4)
B-GLOBULIN SERPL ELPH-MCNC: 0.9 g/dL (ref 0.7–1.3)
Gamma Glob SerPl Elph-Mcnc: 1.2 g/dL (ref 0.4–1.8)
Globulin, Total: 3.4 g/dL (ref 2.2–3.9)
IGG (IMMUNOGLOBIN G), SERUM: 1149 mg/dL (ref 700–1600)
IGM, SERUM: 171 mg/dL (ref 26–217)
IgA: 176 mg/dL (ref 64–422)
Total Protein ELP: 6.6 g/dL (ref 6.0–8.5)

## 2017-02-16 ENCOUNTER — Ambulatory Visit (HOSPITAL_COMMUNITY): Payer: Medicare Other

## 2017-02-16 ENCOUNTER — Other Ambulatory Visit (HOSPITAL_COMMUNITY): Payer: Medicare Other

## 2017-02-17 ENCOUNTER — Other Ambulatory Visit (HOSPITAL_COMMUNITY): Payer: Self-pay | Admitting: Oncology

## 2017-02-17 DIAGNOSIS — C9001 Multiple myeloma in remission: Secondary | ICD-10-CM

## 2017-02-19 ENCOUNTER — Other Ambulatory Visit (HOSPITAL_COMMUNITY): Payer: Self-pay | Admitting: *Deleted

## 2017-02-19 ENCOUNTER — Other Ambulatory Visit (HOSPITAL_COMMUNITY): Payer: Self-pay | Admitting: Oncology

## 2017-02-19 DIAGNOSIS — C9000 Multiple myeloma not having achieved remission: Secondary | ICD-10-CM

## 2017-02-20 ENCOUNTER — Encounter (HOSPITAL_COMMUNITY): Payer: Medicare Other

## 2017-02-20 ENCOUNTER — Encounter (HOSPITAL_COMMUNITY): Payer: Self-pay

## 2017-02-20 ENCOUNTER — Encounter (HOSPITAL_BASED_OUTPATIENT_CLINIC_OR_DEPARTMENT_OTHER): Payer: Medicare Other

## 2017-02-20 VITALS — BP 97/48 | HR 72 | Temp 97.6°F | Resp 18

## 2017-02-20 DIAGNOSIS — C9000 Multiple myeloma not having achieved remission: Secondary | ICD-10-CM

## 2017-02-20 DIAGNOSIS — D631 Anemia in chronic kidney disease: Secondary | ICD-10-CM | POA: Diagnosis not present

## 2017-02-20 DIAGNOSIS — C9001 Multiple myeloma in remission: Secondary | ICD-10-CM

## 2017-02-20 DIAGNOSIS — Z5112 Encounter for antineoplastic immunotherapy: Secondary | ICD-10-CM

## 2017-02-20 LAB — COMPREHENSIVE METABOLIC PANEL
ALBUMIN: 3.3 g/dL — AB (ref 3.5–5.0)
ALK PHOS: 78 U/L (ref 38–126)
ALT: 11 U/L — ABNORMAL LOW (ref 14–54)
ANION GAP: 7 (ref 5–15)
AST: 21 U/L (ref 15–41)
BUN: 23 mg/dL — ABNORMAL HIGH (ref 6–20)
CALCIUM: 8.8 mg/dL — AB (ref 8.9–10.3)
CO2: 25 mmol/L (ref 22–32)
Chloride: 105 mmol/L (ref 101–111)
Creatinine, Ser: 1.42 mg/dL — ABNORMAL HIGH (ref 0.44–1.00)
GFR calc non Af Amer: 35 mL/min — ABNORMAL LOW (ref >60)
GFR, EST AFRICAN AMERICAN: 40 mL/min — AB (ref >60)
GLUCOSE: 103 mg/dL — AB (ref 65–99)
POTASSIUM: 4.3 mmol/L (ref 3.5–5.1)
SODIUM: 137 mmol/L (ref 135–145)
TOTAL PROTEIN: 7.1 g/dL (ref 6.5–8.1)
Total Bilirubin: 0.3 mg/dL (ref 0.3–1.2)

## 2017-02-20 LAB — CBC WITH DIFFERENTIAL/PLATELET
BASOS PCT: 0 %
Basophils Absolute: 0 10*3/uL (ref 0.0–0.1)
EOS ABS: 0.1 10*3/uL (ref 0.0–0.7)
Eosinophils Relative: 1 %
HCT: 26.7 % — ABNORMAL LOW (ref 36.0–46.0)
HEMOGLOBIN: 8.7 g/dL — AB (ref 12.0–15.0)
LYMPHS PCT: 12 %
Lymphs Abs: 1.1 10*3/uL (ref 0.7–4.0)
MCH: 29.9 pg (ref 26.0–34.0)
MCHC: 32.6 g/dL (ref 30.0–36.0)
MCV: 91.8 fL (ref 78.0–100.0)
Monocytes Absolute: 1.2 10*3/uL — ABNORMAL HIGH (ref 0.1–1.0)
Monocytes Relative: 14 %
NEUTROS ABS: 6.4 10*3/uL (ref 1.7–7.7)
NEUTROS PCT: 73 %
PLATELETS: 264 10*3/uL (ref 150–400)
RBC: 2.91 MIL/uL — AB (ref 3.87–5.11)
RDW: 13.8 % (ref 11.5–15.5)
WBC: 8.9 10*3/uL (ref 4.0–10.5)

## 2017-02-20 MED ORDER — BORTEZOMIB CHEMO SQ INJECTION 3.5 MG (2.5MG/ML)
1.0000 mg/m2 | Freq: Once | INTRAMUSCULAR | Status: AC
  Administered 2017-02-20: 1.75 mg via SUBCUTANEOUS
  Filled 2017-02-20: qty 1.75

## 2017-02-20 MED ORDER — PROCHLORPERAZINE MALEATE 10 MG PO TABS
ORAL_TABLET | ORAL | Status: AC
  Filled 2017-02-20: qty 1

## 2017-02-20 MED ORDER — PROCHLORPERAZINE MALEATE 10 MG PO TABS
10.0000 mg | ORAL_TABLET | Freq: Once | ORAL | Status: AC
  Administered 2017-02-20: 10 mg via ORAL

## 2017-02-20 NOTE — Patient Instructions (Signed)
[facility] Discharge Instructions for Patients Receiving Chemotherapy   Beginning January 23rd 2017 lab work for the Cancer Center will be done in the  Main lab at [place] on 1st floor. If you have a lab appointment with the Cancer Center please come in thru the  Main Entrance and check in at the main information desk   Today you received the following chemotherapy agents velcade  To help prevent nausea and vomiting after your treatment, we encourage you to take your nausea medication     If you develop nausea and vomiting, or diarrhea that is not controlled by your medication, call the clinic.  The clinic phone number is [phone]. Office hours are Monday-Friday 8:30am-5:00pm.  BELOW ARE SYMPTOMS THAT SHOULD BE REPORTED IMMEDIATELY:  *FEVER GREATER THAN 101.0 F  *CHILLS WITH OR WITHOUT FEVER  NAUSEA AND VOMITING THAT IS NOT CONTROLLED WITH YOUR NAUSEA MEDICATION  *UNUSUAL SHORTNESS OF BREATH  *UNUSUAL BRUISING OR BLEEDING  TENDERNESS IN MOUTH AND THROAT WITH OR WITHOUT PRESENCE OF ULCERS  *URINARY PROBLEMS  *BOWEL PROBLEMS  UNUSUAL RASH Items with * indicate a potential emergency and should be followed up as soon as possible. If you have an emergency after office hours please contact your primary care physician or go to the nearest emergency department.  Please call the clinic during office hours if you have any questions or concerns.   You may also contact the Patient Navigator at [phone] should you have any questions or need assistance in obtaining follow up care.      Resources For Cancer Patients and their Caregivers ? American Cancer Society: Can assist with transportation, wigs, general needs, runs Look Good Feel Better.        1-888-227-6333 ? Cancer Care: Provides financial assistance, online support groups, medication/co-pay assistance.  1-800-813-HOPE (4673) ? [facility] Assists [place]  cancer patients and their families through emotional , educational and financial support.  336-427-4357 ? [place] DSS Where to apply for food stamps, Medicaid and utility assistance. 336-342-1394 ? RCATS: Transportation to medical appointments. 336-347-2287 ? Social Security Administration: May apply for disability if have a Stage IV cancer. 336-342-7796 1-800-772-1213 ? [place] Aging, Disability and Transit Services: Assists with nutrition, care and transit needs. [phone]         

## 2017-02-20 NOTE — Progress Notes (Signed)
Labs reviewed with MD. Proceed with velcade injection.  Carol Bradley presents today for injection per MD orders. Velcade 1.75 mg administered SQ in right Abdomen. Administration without incident. Patient tolerated well. Vitals stable and discharged home from clinic ambulatory. Follow up as scheduled.

## 2017-02-22 ENCOUNTER — Ambulatory Visit (HOSPITAL_COMMUNITY): Payer: Medicare Other

## 2017-02-22 ENCOUNTER — Other Ambulatory Visit (HOSPITAL_COMMUNITY): Payer: Medicare Other

## 2017-03-02 ENCOUNTER — Other Ambulatory Visit (HOSPITAL_COMMUNITY): Payer: Medicare Other

## 2017-03-02 ENCOUNTER — Ambulatory Visit (HOSPITAL_COMMUNITY): Payer: Medicare Other

## 2017-03-06 ENCOUNTER — Other Ambulatory Visit (HOSPITAL_COMMUNITY): Payer: Self-pay | Admitting: *Deleted

## 2017-03-06 ENCOUNTER — Other Ambulatory Visit (HOSPITAL_COMMUNITY): Payer: Self-pay | Admitting: Oncology

## 2017-03-07 ENCOUNTER — Ambulatory Visit (HOSPITAL_COMMUNITY): Payer: Medicare Other

## 2017-03-07 ENCOUNTER — Other Ambulatory Visit (HOSPITAL_COMMUNITY): Payer: Medicare Other

## 2017-03-08 ENCOUNTER — Ambulatory Visit (HOSPITAL_COMMUNITY): Payer: Medicare Other

## 2017-03-08 ENCOUNTER — Other Ambulatory Visit (HOSPITAL_COMMUNITY): Payer: Medicare Other

## 2017-03-09 ENCOUNTER — Other Ambulatory Visit (HOSPITAL_COMMUNITY): Payer: Self-pay | Admitting: Adult Health

## 2017-03-09 ENCOUNTER — Encounter (HOSPITAL_COMMUNITY): Payer: Medicare Other

## 2017-03-09 ENCOUNTER — Encounter (HOSPITAL_BASED_OUTPATIENT_CLINIC_OR_DEPARTMENT_OTHER): Payer: Medicare Other

## 2017-03-09 VITALS — BP 121/45 | HR 78 | Temp 98.6°F | Resp 16

## 2017-03-09 DIAGNOSIS — D649 Anemia, unspecified: Secondary | ICD-10-CM | POA: Diagnosis present

## 2017-03-09 DIAGNOSIS — C9000 Multiple myeloma not having achieved remission: Secondary | ICD-10-CM

## 2017-03-09 DIAGNOSIS — E538 Deficiency of other specified B group vitamins: Secondary | ICD-10-CM | POA: Diagnosis not present

## 2017-03-09 DIAGNOSIS — N183 Chronic kidney disease, stage 3 (moderate): Principal | ICD-10-CM

## 2017-03-09 DIAGNOSIS — D631 Anemia in chronic kidney disease: Secondary | ICD-10-CM | POA: Diagnosis not present

## 2017-03-09 LAB — CBC
HEMATOCRIT: 24.7 % — AB (ref 36.0–46.0)
HEMOGLOBIN: 8.2 g/dL — AB (ref 12.0–15.0)
MCH: 29.5 pg (ref 26.0–34.0)
MCHC: 33.2 g/dL (ref 30.0–36.0)
MCV: 88.8 fL (ref 78.0–100.0)
Platelets: 279 10*3/uL (ref 150–400)
RBC: 2.78 MIL/uL — ABNORMAL LOW (ref 3.87–5.11)
RDW: 13.7 % (ref 11.5–15.5)
WBC: 8.6 10*3/uL (ref 4.0–10.5)

## 2017-03-09 LAB — COMPREHENSIVE METABOLIC PANEL
ALBUMIN: 3.4 g/dL — AB (ref 3.5–5.0)
ALT: 11 U/L — ABNORMAL LOW (ref 14–54)
ANION GAP: 8 (ref 5–15)
AST: 23 U/L (ref 15–41)
Alkaline Phosphatase: 79 U/L (ref 38–126)
BILIRUBIN TOTAL: 0.3 mg/dL (ref 0.3–1.2)
BUN: 21 mg/dL — ABNORMAL HIGH (ref 6–20)
CALCIUM: 8.8 mg/dL — AB (ref 8.9–10.3)
CO2: 25 mmol/L (ref 22–32)
Chloride: 104 mmol/L (ref 101–111)
Creatinine, Ser: 1.37 mg/dL — ABNORMAL HIGH (ref 0.44–1.00)
GFR calc Af Amer: 42 mL/min — ABNORMAL LOW (ref >60)
GFR, EST NON AFRICAN AMERICAN: 36 mL/min — AB (ref >60)
GLUCOSE: 116 mg/dL — AB (ref 65–99)
Potassium: 3.7 mmol/L (ref 3.5–5.1)
Sodium: 137 mmol/L (ref 135–145)
TOTAL PROTEIN: 6.9 g/dL (ref 6.5–8.1)

## 2017-03-09 LAB — PREPARE RBC (CROSSMATCH)

## 2017-03-09 MED ORDER — DIPHENHYDRAMINE HCL 25 MG PO CAPS
25.0000 mg | ORAL_CAPSULE | Freq: Once | ORAL | Status: AC
  Administered 2017-03-09: 25 mg via ORAL

## 2017-03-09 MED ORDER — CYANOCOBALAMIN 1000 MCG/ML IJ SOLN
1000.0000 ug | Freq: Once | INTRAMUSCULAR | Status: AC
  Administered 2017-03-09: 1000 ug via INTRAMUSCULAR
  Filled 2017-03-09: qty 1

## 2017-03-09 MED ORDER — DIPHENHYDRAMINE HCL 25 MG PO CAPS
ORAL_CAPSULE | ORAL | Status: AC
  Filled 2017-03-09: qty 1

## 2017-03-09 MED ORDER — SODIUM CHLORIDE 0.9 % IV SOLN
250.0000 mL | Freq: Once | INTRAVENOUS | Status: AC
  Administered 2017-03-09: 250 mL via INTRAVENOUS

## 2017-03-09 MED ORDER — ACETAMINOPHEN 325 MG PO TABS
650.0000 mg | ORAL_TABLET | Freq: Once | ORAL | Status: AC
  Administered 2017-03-09: 650 mg via ORAL

## 2017-03-09 MED ORDER — ACETAMINOPHEN 325 MG PO TABS
ORAL_TABLET | ORAL | Status: AC
  Filled 2017-03-09: qty 2

## 2017-03-09 NOTE — Addendum Note (Signed)
Addended by: Donnie Aho on: 03/09/2017 05:26 PM   Modules accepted: Orders

## 2017-03-09 NOTE — Progress Notes (Signed)
Patient is here for scheduled Aranesp, Velcade, and vitamin B12 injections.   She feels short of breath and very weak today.  Hgb 8.2 g/dL.  We will plan to transfuse 1 unit PRBCs today.  She require an additional unit of blood next week; will bring her back for CBC recheck.  Will hold Velcade and Aranesp since giving blood transfusion today.  Will given vitamin B12 injection today.    Mike Craze, NP [place] [phone]

## 2017-03-09 NOTE — Progress Notes (Signed)
Labs reviewed with Mike Craze NP, patient came in today for velcade, B12, aranesp. Patient complained of SOB, dizziness, overall not felling good. Will give her one unit of blood per orders today. Will hold on velcade and aranesp per orders.   One unit of blood given today. Shaniece Bussa [place] presents today for injection per MD orders. B12 1,000 mcg administered IM in right Upper Arm. Administration without incident. Patient tolerated well. Treatment given per orders. Patient tolerated it well without problems. Vitals stable and discharged home from clinic ambulatory. Follow up as scheduled.

## 2017-03-09 NOTE — Patient Instructions (Signed)
[place] at [facility] Discharge Instructions  RECOMMENDATIONS MADE BY THE CONSULTANT AND ANY TEST RESULTS WILL BE SENT TO YOUR REFERRING PHYSICIAN.  One unit of blood given today B12 injection given Held velcade and aranesp today per Mike Craze NP  Thank you for choosing [place] at [facility] to provide your oncology and hematology care.  To afford each patient quality time with our provider, please arrive at least 15 minutes before your scheduled appointment time.    If you have a lab appointment with the [place] please come in thru the  Main Entrance and check in at the main information desk  You need to re-schedule your appointment should you arrive 10 or more minutes late.  We strive to give you quality time with our providers, and arriving late affects you and other patients whose appointments are after yours.  Also, if you no show three or more times for appointments you may be dismissed from the clinic at the providers discretion.     Again, thank you for choosing [facility].  Our hope is that these requests will decrease the amount of time that you wait before being seen by our physicians.       _____________________________________________________________  Should you have questions after your visit to [facility], please contact our office at [phone] between the hours of 8:30 a.m. and 4:30 p.m.  Voicemails left after 4:30 p.m. will not be returned until the following business day.  For prescription refill requests, have your pharmacy contact our office.       Resources For Cancer Patients and their Caregivers ? American Cancer Society: Can assist with transportation, wigs, general needs, runs Look Good Feel Better.        407-430-3611 ? Cancer Care: Provides financial assistance, online support groups, medication/co-pay assistance.  1-800-813-HOPE 872-290-6792) ? [place] Assists [place] cancer patients and their families through emotional , educational and financial support.  301-744-3766 ? [place] DSS Where to apply for food stamps, Medicaid and utility assistance. 7328871759 ? RCATS: Transportation to medical appointments. 781-456-5080 ? Social Security Administration: May apply for disability if have a Stage IV cancer. 640 215 7414 813-886-5901 ? [employer], Disability and Transit Services: Assists with nutrition, care and transit needs. [place] Support Programs: @10RELATIVEDAYS @ > Cancer Support Group  2nd Tuesday of the month 1pm-2pm, Journey Room  > Creative Journey  3rd Tuesday of the month 1130am-1pm, Journey Room  > Look Good Feel Better  1st Wednesday of the month 10am-12 noon, Journey Room (Call [place] to register [phone])

## 2017-03-10 LAB — TYPE AND SCREEN
ABO/RH(D): O POS
ANTIBODY SCREEN: NEGATIVE
Unit division: 0

## 2017-03-10 LAB — BPAM RBC
BLOOD PRODUCT EXPIRATION DATE: 201810022359
ISSUE DATE / TIME: 201808311457
Unit Type and Rh: 5100

## 2017-03-13 ENCOUNTER — Other Ambulatory Visit (HOSPITAL_COMMUNITY): Payer: Self-pay | Admitting: Oncology

## 2017-03-13 DIAGNOSIS — C9001 Multiple myeloma in remission: Secondary | ICD-10-CM

## 2017-03-14 ENCOUNTER — Encounter (HOSPITAL_COMMUNITY): Payer: Medicare Other | Attending: Oncology

## 2017-03-14 ENCOUNTER — Telehealth (HOSPITAL_COMMUNITY): Payer: Self-pay

## 2017-03-14 DIAGNOSIS — I10 Essential (primary) hypertension: Secondary | ICD-10-CM | POA: Insufficient documentation

## 2017-03-14 DIAGNOSIS — D801 Nonfamilial hypogammaglobulinemia: Secondary | ICD-10-CM | POA: Diagnosis not present

## 2017-03-14 DIAGNOSIS — Z809 Family history of malignant neoplasm, unspecified: Secondary | ICD-10-CM | POA: Insufficient documentation

## 2017-03-14 DIAGNOSIS — E785 Hyperlipidemia, unspecified: Secondary | ICD-10-CM | POA: Insufficient documentation

## 2017-03-14 DIAGNOSIS — C9000 Multiple myeloma not having achieved remission: Secondary | ICD-10-CM

## 2017-03-14 DIAGNOSIS — N183 Chronic kidney disease, stage 3 (moderate): Secondary | ICD-10-CM | POA: Insufficient documentation

## 2017-03-14 DIAGNOSIS — Z79899 Other long term (current) drug therapy: Secondary | ICD-10-CM | POA: Diagnosis not present

## 2017-03-14 DIAGNOSIS — I2510 Atherosclerotic heart disease of native coronary artery without angina pectoris: Secondary | ICD-10-CM | POA: Insufficient documentation

## 2017-03-14 DIAGNOSIS — Z8249 Family history of ischemic heart disease and other diseases of the circulatory system: Secondary | ICD-10-CM | POA: Diagnosis not present

## 2017-03-14 DIAGNOSIS — Z888 Allergy status to other drugs, medicaments and biological substances status: Secondary | ICD-10-CM | POA: Insufficient documentation

## 2017-03-14 DIAGNOSIS — C9001 Multiple myeloma in remission: Secondary | ICD-10-CM | POA: Insufficient documentation

## 2017-03-14 DIAGNOSIS — D631 Anemia in chronic kidney disease: Secondary | ICD-10-CM | POA: Insufficient documentation

## 2017-03-14 DIAGNOSIS — J449 Chronic obstructive pulmonary disease, unspecified: Secondary | ICD-10-CM | POA: Diagnosis not present

## 2017-03-14 DIAGNOSIS — Z7982 Long term (current) use of aspirin: Secondary | ICD-10-CM | POA: Diagnosis not present

## 2017-03-14 DIAGNOSIS — Z9889 Other specified postprocedural states: Secondary | ICD-10-CM | POA: Diagnosis not present

## 2017-03-14 DIAGNOSIS — Z87891 Personal history of nicotine dependence: Secondary | ICD-10-CM | POA: Insufficient documentation

## 2017-03-14 DIAGNOSIS — E538 Deficiency of other specified B group vitamins: Secondary | ICD-10-CM | POA: Insufficient documentation

## 2017-03-14 LAB — CBC WITH DIFFERENTIAL/PLATELET
BASOS ABS: 0 10*3/uL (ref 0.0–0.1)
Basophils Relative: 0 %
EOS PCT: 2 %
Eosinophils Absolute: 0.1 10*3/uL (ref 0.0–0.7)
HEMATOCRIT: 30.3 % — AB (ref 36.0–46.0)
Hemoglobin: 9.7 g/dL — ABNORMAL LOW (ref 12.0–15.0)
LYMPHS ABS: 1.3 10*3/uL (ref 0.7–4.0)
LYMPHS PCT: 18 %
MCH: 28.2 pg (ref 26.0–34.0)
MCHC: 32 g/dL (ref 30.0–36.0)
MCV: 88.1 fL (ref 78.0–100.0)
MONO ABS: 0.8 10*3/uL (ref 0.1–1.0)
MONOS PCT: 11 %
NEUTROS ABS: 4.8 10*3/uL (ref 1.7–7.7)
Neutrophils Relative %: 69 %
PLATELETS: 271 10*3/uL (ref 150–400)
RBC: 3.44 MIL/uL — ABNORMAL LOW (ref 3.87–5.11)
RDW: 13.9 % (ref 11.5–15.5)
WBC: 7 10*3/uL (ref 4.0–10.5)

## 2017-03-14 LAB — SAMPLE TO BLOOD BANK

## 2017-03-14 NOTE — Telephone Encounter (Signed)
See telephone encounter note.

## 2017-03-21 ENCOUNTER — Other Ambulatory Visit (HOSPITAL_COMMUNITY): Payer: Self-pay | Admitting: Adult Health

## 2017-03-21 DIAGNOSIS — C9000 Multiple myeloma not having achieved remission: Secondary | ICD-10-CM

## 2017-03-21 DIAGNOSIS — R110 Nausea: Secondary | ICD-10-CM

## 2017-03-21 MED ORDER — PROCHLORPERAZINE MALEATE 10 MG PO TABS: 10.0000 mg | ORAL_TABLET | Freq: Four times a day (QID) | ORAL | 3 refills | Status: DC | PRN

## 2017-03-22 ENCOUNTER — Ambulatory Visit (HOSPITAL_COMMUNITY): Payer: Medicare Other

## 2017-03-22 ENCOUNTER — Other Ambulatory Visit (HOSPITAL_COMMUNITY): Payer: Medicare Other

## 2017-03-22 ENCOUNTER — Other Ambulatory Visit (HOSPITAL_COMMUNITY): Payer: Self-pay | Admitting: *Deleted

## 2017-03-22 DIAGNOSIS — C9000 Multiple myeloma not having achieved remission: Secondary | ICD-10-CM

## 2017-03-23 ENCOUNTER — Ambulatory Visit (HOSPITAL_COMMUNITY): Payer: Medicare Other

## 2017-03-23 ENCOUNTER — Other Ambulatory Visit (HOSPITAL_COMMUNITY): Payer: Medicare Other

## 2017-03-26 ENCOUNTER — Encounter (HOSPITAL_BASED_OUTPATIENT_CLINIC_OR_DEPARTMENT_OTHER): Payer: Medicare Other

## 2017-03-26 ENCOUNTER — Encounter (HOSPITAL_COMMUNITY): Payer: Medicare Other

## 2017-03-26 ENCOUNTER — Encounter (HOSPITAL_COMMUNITY): Payer: Self-pay

## 2017-03-26 VITALS — BP 127/46 | HR 66 | Temp 97.8°F | Resp 16 | Wt 148.0 lb

## 2017-03-26 DIAGNOSIS — C9000 Multiple myeloma not having achieved remission: Secondary | ICD-10-CM

## 2017-03-26 DIAGNOSIS — D631 Anemia in chronic kidney disease: Secondary | ICD-10-CM

## 2017-03-26 DIAGNOSIS — N183 Chronic kidney disease, stage 3 unspecified: Secondary | ICD-10-CM

## 2017-03-26 DIAGNOSIS — C9001 Multiple myeloma in remission: Secondary | ICD-10-CM | POA: Diagnosis not present

## 2017-03-26 DIAGNOSIS — Z5112 Encounter for antineoplastic immunotherapy: Secondary | ICD-10-CM

## 2017-03-26 DIAGNOSIS — D801 Nonfamilial hypogammaglobulinemia: Secondary | ICD-10-CM

## 2017-03-26 DIAGNOSIS — E538 Deficiency of other specified B group vitamins: Secondary | ICD-10-CM

## 2017-03-26 LAB — COMPREHENSIVE METABOLIC PANEL
ALT: 10 U/L — ABNORMAL LOW (ref 14–54)
ANION GAP: 9 (ref 5–15)
AST: 20 U/L (ref 15–41)
Albumin: 3.4 g/dL — ABNORMAL LOW (ref 3.5–5.0)
Alkaline Phosphatase: 80 U/L (ref 38–126)
BUN: 27 mg/dL — ABNORMAL HIGH (ref 6–20)
CALCIUM: 9 mg/dL (ref 8.9–10.3)
CHLORIDE: 103 mmol/L (ref 101–111)
CO2: 26 mmol/L (ref 22–32)
Creatinine, Ser: 1.44 mg/dL — ABNORMAL HIGH (ref 0.44–1.00)
GFR, EST AFRICAN AMERICAN: 40 mL/min — AB (ref >60)
GFR, EST NON AFRICAN AMERICAN: 34 mL/min — AB (ref >60)
Glucose, Bld: 105 mg/dL — ABNORMAL HIGH (ref 65–99)
Potassium: 4.4 mmol/L (ref 3.5–5.1)
SODIUM: 138 mmol/L (ref 135–145)
Total Bilirubin: 0.3 mg/dL (ref 0.3–1.2)
Total Protein: 7.3 g/dL (ref 6.5–8.1)

## 2017-03-26 LAB — CBC WITH DIFFERENTIAL/PLATELET
Basophils Absolute: 0 10*3/uL (ref 0.0–0.1)
Basophils Relative: 0 %
EOS ABS: 0.1 10*3/uL (ref 0.0–0.7)
EOS PCT: 1 %
HCT: 29.7 % — ABNORMAL LOW (ref 36.0–46.0)
Hemoglobin: 9.6 g/dL — ABNORMAL LOW (ref 12.0–15.0)
LYMPHS PCT: 12 %
Lymphs Abs: 1.1 10*3/uL (ref 0.7–4.0)
MCH: 28.3 pg (ref 26.0–34.0)
MCHC: 32.3 g/dL (ref 30.0–36.0)
MCV: 87.6 fL (ref 78.0–100.0)
MONO ABS: 1.2 10*3/uL — AB (ref 0.1–1.0)
MONOS PCT: 13 %
Neutro Abs: 6.6 10*3/uL (ref 1.7–7.7)
Neutrophils Relative %: 74 %
PLATELETS: 257 10*3/uL (ref 150–400)
RBC: 3.39 MIL/uL — ABNORMAL LOW (ref 3.87–5.11)
RDW: 14.2 % (ref 11.5–15.5)
WBC: 8.9 10*3/uL (ref 4.0–10.5)

## 2017-03-26 MED ORDER — PROCHLORPERAZINE MALEATE 10 MG PO TABS: 10.0000 mg | ORAL_TABLET | Freq: Once | ORAL | Status: DC

## 2017-03-26 MED ORDER — BORTEZOMIB CHEMO SQ INJECTION 3.5 MG (2.5MG/ML)
1.0000 mg/m2 | Freq: Once | INTRAMUSCULAR | Status: AC
  Administered 2017-03-26: 1.75 mg via SUBCUTANEOUS
  Filled 2017-03-26: qty 1.75

## 2017-03-26 MED ORDER — DARBEPOETIN ALFA 60 MCG/0.3ML IJ SOSY
60.0000 ug | PREFILLED_SYRINGE | Freq: Once | INTRAMUSCULAR | Status: AC
  Administered 2017-03-26: 60 ug via SUBCUTANEOUS
  Filled 2017-03-26: qty 0.3

## 2017-03-26 NOTE — Progress Notes (Signed)
Carol Bradley tolerated Velcade and Aranesp injections well without complaints or incident. Labs reviewed with Dr. Talbert Cage prior to administering these medications. Hgb 9.6 today. VSS Pt discharged self ambulatory in satisfactory condition accompanied by a friend.

## 2017-03-26 NOTE — Patient Instructions (Signed)
[facility] Discharge Instructions for Patients Receiving Chemotherapy   Beginning January 23rd 2017 lab work for the [facility] will be done in the  Main lab at [facility] on 1st floor. If you have a lab appointment with the [place] please come in thru the  Main Entrance and check in at the main information desk   Today you received the following chemotherapy agents Velcade injection as well as Aranesp injection. Follow-up as scheduled. Call clinic for any questions or concerns  To help prevent nausea and vomiting after your treatment, we encourage you to take your nausea medication   If you develop nausea and vomiting, or diarrhea that is not controlled by your medication, call the clinic.  The clinic phone number is [phone]. Office hours are Monday-Friday 8:30am-5:00pm.  BELOW ARE SYMPTOMS THAT SHOULD BE REPORTED IMMEDIATELY:  *FEVER GREATER THAN 101.0 F  *CHILLS WITH OR WITHOUT FEVER  NAUSEA AND VOMITING THAT IS NOT CONTROLLED WITH YOUR NAUSEA MEDICATION  *UNUSUAL SHORTNESS OF BREATH  *UNUSUAL BRUISING OR BLEEDING  TENDERNESS IN MOUTH AND THROAT WITH OR WITHOUT PRESENCE OF ULCERS  *URINARY PROBLEMS  *BOWEL PROBLEMS  UNUSUAL RASH Items with * indicate a potential emergency and should be followed up as soon as possible. If you have an emergency after office hours please contact your primary care physician or go to the nearest emergency department.  Please call the clinic during office hours if you have any questions or concerns.   You may also contact the Patient Navigator at [phone] should you have any questions or need assistance in obtaining follow up care.      Resources For Cancer Patients and their Caregivers ? American Cancer Society: Can assist with transportation, wigs, general needs, runs Look Good Feel Better.        670-460-0434 ? Cancer Care: Provides financial assistance, online support groups,  medication/co-pay assistance.  1-800-813-HOPE 302-605-0569) ? [place] Assists [place] cancer patients and their families through emotional , educational and financial support.  (402)246-3857 ? [place] DSS Where to apply for food stamps, Medicaid and utility assistance. (434)823-3207 ? RCATS: Transportation to medical appointments. 657 693 5823 ? Social Security Administration: May apply for disability if have a Stage IV cancer. 332-465-9963 309-209-6736 ? [employer], Disability and Transit Services: Assists with nutrition, care and transit needs. [phone]

## 2017-04-05 ENCOUNTER — Other Ambulatory Visit (HOSPITAL_COMMUNITY): Payer: Medicare Other

## 2017-04-05 ENCOUNTER — Ambulatory Visit (HOSPITAL_COMMUNITY): Payer: Medicare Other

## 2017-04-06 ENCOUNTER — Other Ambulatory Visit (HOSPITAL_COMMUNITY): Payer: Medicare Other

## 2017-04-06 ENCOUNTER — Ambulatory Visit (HOSPITAL_COMMUNITY): Payer: Medicare Other

## 2017-04-06 ENCOUNTER — Encounter (HOSPITAL_BASED_OUTPATIENT_CLINIC_OR_DEPARTMENT_OTHER): Payer: Medicare Other | Admitting: Hematology and Oncology

## 2017-04-06 ENCOUNTER — Ambulatory Visit (HOSPITAL_COMMUNITY): Payer: Medicare Other | Admitting: Hematology and Oncology

## 2017-04-06 ENCOUNTER — Encounter (HOSPITAL_COMMUNITY): Payer: Medicare Other

## 2017-04-06 ENCOUNTER — Encounter (HOSPITAL_BASED_OUTPATIENT_CLINIC_OR_DEPARTMENT_OTHER): Payer: Medicare Other

## 2017-04-06 ENCOUNTER — Encounter (HOSPITAL_COMMUNITY): Payer: Self-pay

## 2017-04-06 VITALS — BP 115/44 | HR 70 | Temp 98.2°F | Resp 20 | Wt 150.0 lb

## 2017-04-06 DIAGNOSIS — C9001 Multiple myeloma in remission: Secondary | ICD-10-CM

## 2017-04-06 DIAGNOSIS — C9000 Multiple myeloma not having achieved remission: Secondary | ICD-10-CM | POA: Diagnosis not present

## 2017-04-06 DIAGNOSIS — D801 Nonfamilial hypogammaglobulinemia: Secondary | ICD-10-CM

## 2017-04-06 DIAGNOSIS — N183 Chronic kidney disease, stage 3 unspecified: Secondary | ICD-10-CM

## 2017-04-06 DIAGNOSIS — Z23 Encounter for immunization: Secondary | ICD-10-CM | POA: Diagnosis not present

## 2017-04-06 DIAGNOSIS — E538 Deficiency of other specified B group vitamins: Secondary | ICD-10-CM | POA: Diagnosis not present

## 2017-04-06 DIAGNOSIS — Z5112 Encounter for antineoplastic immunotherapy: Secondary | ICD-10-CM | POA: Diagnosis present

## 2017-04-06 DIAGNOSIS — D631 Anemia in chronic kidney disease: Secondary | ICD-10-CM

## 2017-04-06 LAB — COMPREHENSIVE METABOLIC PANEL
ALT: 11 U/L — ABNORMAL LOW (ref 14–54)
ANION GAP: 9 (ref 5–15)
AST: 21 U/L (ref 15–41)
Albumin: 3.4 g/dL — ABNORMAL LOW (ref 3.5–5.0)
Alkaline Phosphatase: 74 U/L (ref 38–126)
BUN: 31 mg/dL — AB (ref 6–20)
CHLORIDE: 102 mmol/L (ref 101–111)
CO2: 26 mmol/L (ref 22–32)
Calcium: 8.8 mg/dL — ABNORMAL LOW (ref 8.9–10.3)
Creatinine, Ser: 1.4 mg/dL — ABNORMAL HIGH (ref 0.44–1.00)
GFR, EST AFRICAN AMERICAN: 41 mL/min — AB (ref >60)
GFR, EST NON AFRICAN AMERICAN: 35 mL/min — AB (ref >60)
Glucose, Bld: 102 mg/dL — ABNORMAL HIGH (ref 65–99)
POTASSIUM: 4.3 mmol/L (ref 3.5–5.1)
Sodium: 137 mmol/L (ref 135–145)
Total Bilirubin: 0.3 mg/dL (ref 0.3–1.2)
Total Protein: 7 g/dL (ref 6.5–8.1)

## 2017-04-06 LAB — CBC
HCT: 28.6 % — ABNORMAL LOW (ref 36.0–46.0)
Hemoglobin: 9.1 g/dL — ABNORMAL LOW (ref 12.0–15.0)
MCH: 27.7 pg (ref 26.0–34.0)
MCHC: 31.8 g/dL (ref 30.0–36.0)
MCV: 86.9 fL (ref 78.0–100.0)
PLATELETS: 252 10*3/uL (ref 150–400)
RBC: 3.29 MIL/uL — ABNORMAL LOW (ref 3.87–5.11)
RDW: 14.5 % (ref 11.5–15.5)
WBC: 6.7 10*3/uL (ref 4.0–10.5)

## 2017-04-06 MED ORDER — CYANOCOBALAMIN 1000 MCG/ML IJ SOLN
INTRAMUSCULAR | Status: AC
  Filled 2017-04-06: qty 1

## 2017-04-06 MED ORDER — CYANOCOBALAMIN 1000 MCG/ML IJ SOLN
1000.0000 ug | Freq: Once | INTRAMUSCULAR | Status: AC
  Administered 2017-04-06: 1000 ug via INTRAMUSCULAR

## 2017-04-06 MED ORDER — INFLUENZA VAC SPLIT QUAD 0.5 ML IM SUSY
0.5000 mL | PREFILLED_SYRINGE | Freq: Once | INTRAMUSCULAR | Status: AC
  Administered 2017-04-06: 0.5 mL via INTRAMUSCULAR

## 2017-04-06 MED ORDER — BORTEZOMIB CHEMO SQ INJECTION 3.5 MG (2.5MG/ML)
1.0000 mg/m2 | Freq: Once | INTRAMUSCULAR | Status: AC
  Administered 2017-04-06: 1.75 mg via SUBCUTANEOUS
  Filled 2017-04-06: qty 1.75

## 2017-04-06 MED ORDER — INFLUENZA VAC SPLIT QUAD 0.5 ML IM SUSY
PREFILLED_SYRINGE | INTRAMUSCULAR | Status: AC
  Filled 2017-04-06: qty 0.5

## 2017-04-06 NOTE — Patient Instructions (Signed)
[place] at [facility] Discharge Instructions  RECOMMENDATIONS MADE BY THE CONSULTANT AND ANY TEST RESULTS WILL BE SENT TO YOUR REFERRING PHYSICIAN.   Today you saw Dr. Lebron Conners  Thank you for choosing [place] at [facility] to provide your oncology and hematology care.  To afford each patient quality time with our provider, please arrive at least 15 minutes before your scheduled appointment time.    If you have a lab appointment with the [place] please come in thru the  Main Entrance and check in at the main information desk  You need to re-schedule your appointment should you arrive 10 or more minutes late.  We strive to give you quality time with our providers, and arriving late affects you and other patients whose appointments are after yours.  Also, if you no show three or more times for appointments you may be dismissed from the clinic at the providers discretion.     Again, thank you for choosing [facility].  Our hope is that these requests will decrease the amount of time that you wait before being seen by our physicians.       _____________________________________________________________  Should you have questions after your visit to [facility], please contact our office at [phone] between the hours of 8:30 a.m. and 4:30 p.m.  Voicemails left after 4:30 p.m. will not be returned until the following business day.  For prescription refill requests, have your pharmacy contact our office.       Resources For Cancer Patients and their Caregivers ? American Cancer Society: Can assist with transportation, wigs, general needs, runs Look Good Feel Better.        [phone] ? Cancer Care: Provides financial assistance, online support groups, medication/co-pay assistance.  1-800-813-HOPE 815-657-2942) ? [place] Assists [place] cancer patients and their families through  emotional , educational and financial support.  (323)164-6689 ? [place] DSS Where to apply for food stamps, Medicaid and utility assistance. 215-880-2139 ? RCATS: Transportation to medical appointments. 217-836-7967 ? Social Security Administration: May apply for disability if have a Stage IV cancer. 534 652 9885 249-404-9293 ? [employer], Disability and Transit Services: Assists with nutrition, care and transit needs. [place] Support Programs: @10RELATIVEDAYS @ > Cancer Support Group  2nd Tuesday of the month 1pm-2pm, Journey Room  > Creative Journey  3rd Tuesday of the month 1130am-1pm, Journey Room  > Look Good Feel Better  1st Wednesday of the month 10am-12 noon, Journey Room (Call [place] to register [phone])

## 2017-04-06 NOTE — Progress Notes (Signed)
Carol Bradley presents today for injection per MD orders.  Carol Bradley 1.75 mg administered SQ in right Abdomen. Administration without incident. Patient tolerated well.  Carol Bradley presents today for injection per MD orders. Carol Bradley 1,016mcg administered IM in right Upper Arm. Administration without incident. Patient tolerated well.  Carol Bradley presents today for injection per MD orders. Carol Bradley 0.5 ml administered IM in left Upper Arm. Administration without incident. Patient tolerated well.  Treatment given per orders. Patient tolerated it well without problems. Vitals stable and discharged home from clinic ambulatory. Follow up as scheduled.

## 2017-04-06 NOTE — Patient Instructions (Signed)
[facility] Discharge Instructions for Patients Receiving Chemotherapy   Beginning January 23rd 2017 lab work for the [facility] will be done in the  Main lab at [facility] on 1st floor. If you have a lab appointment with the [place] please come in thru the  Main Entrance and check in at the main information desk  B12 injection, flu vaccine, velcade given.  Today you received the following chemotherapy agents   To help prevent nausea and vomiting after your treatment, we encourage you to take your nausea medication     If you develop nausea and vomiting, or diarrhea that is not controlled by your medication, call the clinic.  The clinic phone number is [phone]. Office hours are Monday-Friday 8:30am-5:00pm.  BELOW ARE SYMPTOMS THAT SHOULD BE REPORTED IMMEDIATELY:  *FEVER GREATER THAN 101.0 F  *CHILLS WITH OR WITHOUT FEVER  NAUSEA AND VOMITING THAT IS NOT CONTROLLED WITH YOUR NAUSEA MEDICATION  *UNUSUAL SHORTNESS OF BREATH  *UNUSUAL BRUISING OR BLEEDING  TENDERNESS IN MOUTH AND THROAT WITH OR WITHOUT PRESENCE OF ULCERS  *URINARY PROBLEMS  *BOWEL PROBLEMS  UNUSUAL RASH Items with * indicate a potential emergency and should be followed up as soon as possible. If you have an emergency after office hours please contact your primary care physician or go to the nearest emergency department.  Please call the clinic during office hours if you have any questions or concerns.   You may also contact the Patient Navigator at [phone] should you have any questions or need assistance in obtaining follow up care.      Resources For Cancer Patients and their Caregivers ? American Cancer Society: Can assist with transportation, wigs, general needs, runs Look Good Feel Better.        ([phone] ? Cancer Care: Provides financial assistance, online support groups, medication/co-pay assistance.  1-800-813-HOPE (646)286-1334) ? [place] Assists [place] cancer patients and their families through emotional , educational and financial support.  470-472-2537 ? [place] DSS Where to apply for food stamps, Medicaid and utility assistance. (478)408-6526 ? RCATS: Transportation to medical appointments. 878-357-9010 ? Social Security Administration: May apply for disability if have a Stage IV cancer. 503 581 [phone] ? [employer], Disability and Transit Services: Assists with nutrition, care and transit needs. [phone]

## 2017-04-12 NOTE — Progress Notes (Signed)
[facility] Follow-up Visit:  Assessment: Multiple myeloma ([place]) 76 year old female with diagnosis of multiple myeloma, currently receiving maintenance bortezomib therapy QoWk with supportive care including B12 and Aranesp administered monthly.  Conclusion evaluation and lab work today permissive to proceed with the next segment over the therapy.  Plan: --Continue management knows bortezomib --Return to clinic in 2 months for lab work and clinical review  Voice recognition software was used and creation of this note. Despite my best effort at editing the text, some misspelling/errors may have occurred.  Orders Placed This Encounter  Procedures  . CBC with Differential    Standing Status:   Future    Standing Expiration Date:   04/06/2018  . Comprehensive metabolic panel    Standing Status:   Future    Standing Expiration Date:   04/06/2018  . CBC with Differential    Standing Status:   Future    Standing Expiration Date:   04/06/2018  . Comprehensive metabolic panel    Standing Status:   Future    Standing Expiration Date:   04/06/2018  . Magnesium    Standing Status:   Future    Standing Expiration Date:   04/06/2018    Cancer Staging No matching staging information was found for the patient.  All questions were answered.  . The patient knows to call the clinic with any problems, questions or concerns.  This note was electronically signed.    History of Presenting Illness Carol Bradley 76 y.o. presenting to the [facility] for Continued maintenance therapy with bortezomib administered every other week for diagnosis of multiple myeloma. In the interim, patient reports poor energy, intermittent stable dizziness that she has been experiencing for period of time. She denies any interval fevers, chills, night sweats. No new neuropathic changes to sensation or strength. She has a visit with Dr. Norma Fredrickson scheduled in [place] in  November.  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       03/18/2015 - 07/28/2015 Chemotherapy    Velcade 1.6 mg/m2 discontinued secondary to intolerance      07/28/2015 Adverse Reaction    stool incontinence, weakness, collapse upon standing, felt to be secondary to velcade      11/09/2015 - 12/13/2015 Chemotherapy    cytoxan IV 300 mg/m2 administered X 2 doses only in addition to rev/dex      11/09/2015 - 06/08/2016 Chemotherapy    Revlimid/Dexamethasone       02/05/2017 -  Chemotherapy    The patient had bortezomib SQ (VELCADE) chemo injection 1.75 mg, 1 mg/m2 = 1.75 mg (66.7 % of original dose 1.5 mg/m2), Subcutaneous,  Once, 1 of 8 cycles Dose modification: 1.5 mg/m2 (original dose 1.5 mg/m2, Cycle 1, Reason: Provider Judgment, Comment: Per Dr. Norma Fredrickson recommendations from [facility] 1 mg/m2 (original dose 1.5 mg/m2, Cycle 1, Reason: Provider Judgment)  for chemotherapy treatment.         Medical History: Past Medical History:  Diagnosis Date  . 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 CAD (coronary artery disease)   . COPD (chronic obstructive pulmonary disease) ([place])   . History of tobacco abuse   . Hyperlipidemia   . Hypertension   . 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 Multiple myeloma ([place])   .  Vitamin B12 deficiency 04/15/2016   Overview:  Vitamin B12 level documented 155, January 2016 with the normal range being 211-924    Surgical History: Past Surgical History:  Procedure Laterality Date  . BACK SURGERY    . CARDIAC CATHETERIZATION  04/2011   right and left cath showing normal right heart pressures,but newly diagnosed coronary artery disease/drug eluting stent placed to RCA with residual disease in the proximal RCA and LAD  and ramus, normal LV function and 60-65% EF  . COLONOSCOPY N/A 09/30/2014   Procedure: COLONOSCOPY;  Surgeon: Rogene Houston, MD;  Location[facility];  Service: Endoscopy;  Laterality: N/A;  225  . CORONARY ANGIOPLASTY WITH STENT PLACEMENT  04/2011   mid RCA: 3.0 X38 mm Promus DES. Residual 40% disease proximally  . NECK SURGERY      Family History: Family History  Problem Relation Age of Onset  . Heart failure Mother   . Cancer Father     Social History: Social History   Social History  . Marital status: Married    Spouse name: N/A  . Number of children: N/A  . Years of education: N/A   Occupational History  . 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 Smoking status: Former Smoker    Packs/day: 3.00    Years: 25.00    Types: Cigarettes    Quit date: 07/10/1994  . Smokeless tobacco: Never Used  . Alcohol use No  . Drug use: Unknown  . Sexual activity: Not on file   Other Topics Concern  . Not on file   Social History Narrative  . No narrative on file    Allergies: Allergies  Allergen Reactions  . Lisinopril Cough  . Plavix [Clopidogrel Bisulfate] Swelling    Medications:  Current Outpatient Prescriptions  Medication Sig Dispense Refill  . acyclovir (ZOVIRAX) 200 MG capsule TAKE ONE CAPSULE BY MOUTH TWICE DAILY 180 capsule 1  . aspirin EC 81 MG tablet Take 1 tablet (81 mg total) by mouth daily. 90 tablet 3  . atorvastatin (LIPITOR) 10 MG tablet TAKE ONE TABLET BY MOUTH DAILY 90 tablet 1  . Bortezomib (VELCADE IJ) Inject as directed.    . Calcium Carbonate-Vit D-Min (CALCIUM 600+D PLUS MINERALS) 600-400 MG-UNIT CHEW Chew 2 tablets by mouth daily.    . diazepam (VALIUM) 5 MG tablet Take 1 tablet (5 mg total) by mouth every 6 (six) hours as needed for anxiety. 30 tablet 0  . diltiazem (CARDIZEM CD) 240 MG 24 hr capsule TAKE ONE CAPSULE BY MOUTH DAILY 30 capsule 6  . losartan (COZAAR) 100 MG tablet TAKE ONE TABLET BY MOUTH DAILY 90  tablet 1  . magnesium oxide (MAG-OX) 400 (241.3 Mg) MG tablet TAKE ONE TABLET BY MOUTH TWICE DAILY. WHEN TAKING LASIX (Patient taking differently: TAKE ONE TABLET BY MOUTH  DAILY. WHEN TAKING LASIX) 60 tablet 1  . metoprolol tartrate (LOPRESSOR) 25 MG tablet TAKE ONE TABLET BY MOUTH TWICE DAILY 180 tablet 3  . potassium chloride (K-DUR,KLOR-CON) 10 MEQ tablet TAKE ONE TABLET BY MOUTH THREE TIMES DAILY WHEN TAKING LASIX 90 tablet 1  . prochlorperazine (COMPAZINE) 10 MG tablet Take 1 tablet (10 mg total) by mouth every 6 (six) hours as needed for nausea or vomiting. 30 tablet 3   No current facility-administered medications for this visit.     Review of Systems: Review of Systems  All other systems reviewed and are negative.    PHYSICAL EXAMINATION  Blood pressure (!) 115/44, pulse 70, temperature 98.2 F (36.8 C), temperature source Oral, resp. rate 20, weight 150 lb (68 kg), SpO2 96 %.  ECOG PERFORMANCE STATUS: 2 - Symptomatic, <50% confined to bed  Physical Exam  Constitutional: She is oriented to person, place, and time and well-developed, well-nourished, and in no distress. No distress.  HENT:  Head: Normocephalic and atraumatic.  Mouth/Throat: Oropharynx is clear and moist. No oropharyngeal exudate.  Eyes: Pupils are equal, round, and reactive to light. Right eye exhibits no discharge. No scleral icterus.  Neck: Normal range of motion. No thyromegaly present.  Cardiovascular: Normal rate, regular rhythm and normal heart sounds.   No murmur heard. Pulmonary/Chest: Effort normal and breath sounds normal. No respiratory distress. She has no wheezes. She has no rales.  Abdominal: Soft. Bowel sounds are normal. She exhibits no distension. There is no tenderness. There is no rebound.  Musculoskeletal: Normal range of motion. She exhibits no edema.  Lymphadenopathy:    She has no cervical adenopathy.  Neurological: She is alert and oriented to person, place, and time. She has normal  reflexes. No cranial nerve deficit.  Skin: Skin is warm and dry. She is not diaphoretic.     LABORATORY DATA: I have personally reviewed the data as listed: Appointment on 04/06/2017  Component Date Value Ref Range Status  . WBC 04/06/2017 6.7  4.0 - 10.5 K/uL Final  . RBC 04/06/2017 3.29* 3.87 - 5.11 MIL/uL Final  . Hemoglobin 04/06/2017 9.1* 12.0 - 15.0 g/dL Final  . HCT 04/06/2017 28.6* 36.0 - 46.0 % Final  . MCV 04/06/2017 86.9  78.0 - 100.0 fL Final  . MCH 04/06/2017 27.7  26.0 - 34.0 pg Final  . MCHC 04/06/2017 31.8  30.0 - 36.0 g/dL Final  . RDW 04/06/2017 14.5  11.5 - 15.5 % Final  . Platelets 04/06/2017 252  150 - 400 K/uL Final  . Sodium 04/06/2017 137  135 - 145 mmol/L Final  . Potassium 04/06/2017 4.3  3.5 - 5.1 mmol/L Final  . Chloride 04/06/2017 102  101 - 111 mmol/L Final  . CO2 04/06/2017 26  22 - 32 mmol/L Final  . Glucose, Bld 04/06/2017 102* 65 - 99 mg/dL Final  . BUN 04/06/2017 31* 6 - 20 mg/dL Final  . Creatinine, Ser 04/06/2017 1.40* 0.44 - 1.00 mg/dL Final  . Calcium 04/06/2017 8.8* 8.9 - 10.3 mg/dL Final  . Total Protein 04/06/2017 7.0  6.5 - 8.1 g/dL Final  . Albumin 04/06/2017 3.4* 3.5 - 5.0 g/dL Final  . AST 04/06/2017 21  15 - 41 U/L Final  . ALT 04/06/2017 11* 14 - 54 U/L Final  . Alkaline Phosphatase 04/06/2017 74  38 - 126 U/L Final  . Total Bilirubin 04/06/2017 0.3  0.3 - 1.2 mg/dL Final  . GFR calc non Af Amer 04/06/2017 35* >60 mL/min Final  . GFR calc Af Amer 04/06/2017 41* >60 mL/min Final   Comment: (NOTE) The eGFR has been calculated using the CKD EPI equation. This calculation has not been validated in all clinical situations. eGFR's persistently <60 mL/min signify possible Chronic Kidney Disease.   Georgiann Hahn gap 04/06/2017 9  5 - 15 Final       Ardath Sax, MD

## 2017-04-12 NOTE — Assessment & Plan Note (Signed)
76 year old female with diagnosis of multiple myeloma, currently receiving maintenance bortezomib therapy QoWk with supportive care including B12 and Aranesp administered monthly.  Conclusion evaluation and lab work today permissive to proceed with the next segment over the therapy.  Plan: --Continue management knows bortezomib --Return to clinic in 2 months for lab work and clinical review

## 2017-04-20 ENCOUNTER — Other Ambulatory Visit (HOSPITAL_COMMUNITY): Payer: Medicare Other

## 2017-04-20 ENCOUNTER — Ambulatory Visit (HOSPITAL_COMMUNITY): Payer: Medicare Other

## 2017-04-23 ENCOUNTER — Encounter (HOSPITAL_COMMUNITY): Payer: Self-pay

## 2017-04-23 ENCOUNTER — Encounter (HOSPITAL_COMMUNITY): Payer: Medicare Other | Attending: Oncology

## 2017-04-23 VITALS — BP 120/50 | HR 64 | Temp 97.5°F | Resp 18 | Wt 148.0 lb

## 2017-04-23 DIAGNOSIS — E785 Hyperlipidemia, unspecified: Secondary | ICD-10-CM | POA: Diagnosis not present

## 2017-04-23 DIAGNOSIS — C9001 Multiple myeloma in remission: Secondary | ICD-10-CM | POA: Insufficient documentation

## 2017-04-23 DIAGNOSIS — J449 Chronic obstructive pulmonary disease, unspecified: Secondary | ICD-10-CM | POA: Insufficient documentation

## 2017-04-23 DIAGNOSIS — I129 Hypertensive chronic kidney disease with stage 1 through stage 4 chronic kidney disease, or unspecified chronic kidney disease: Secondary | ICD-10-CM | POA: Diagnosis not present

## 2017-04-23 DIAGNOSIS — Z5112 Encounter for antineoplastic immunotherapy: Secondary | ICD-10-CM

## 2017-04-23 DIAGNOSIS — D631 Anemia in chronic kidney disease: Secondary | ICD-10-CM | POA: Diagnosis not present

## 2017-04-23 DIAGNOSIS — D801 Nonfamilial hypogammaglobulinemia: Secondary | ICD-10-CM | POA: Diagnosis not present

## 2017-04-23 DIAGNOSIS — E538 Deficiency of other specified B group vitamins: Secondary | ICD-10-CM

## 2017-04-23 DIAGNOSIS — Z87891 Personal history of nicotine dependence: Secondary | ICD-10-CM | POA: Diagnosis not present

## 2017-04-23 DIAGNOSIS — C9000 Multiple myeloma not having achieved remission: Secondary | ICD-10-CM

## 2017-04-23 DIAGNOSIS — N183 Chronic kidney disease, stage 3 unspecified: Secondary | ICD-10-CM

## 2017-04-23 DIAGNOSIS — I2510 Atherosclerotic heart disease of native coronary artery without angina pectoris: Secondary | ICD-10-CM | POA: Diagnosis not present

## 2017-04-23 LAB — COMPREHENSIVE METABOLIC PANEL
ALT: 12 U/L — ABNORMAL LOW (ref 14–54)
ANION GAP: 8 (ref 5–15)
AST: 21 U/L (ref 15–41)
Albumin: 3.4 g/dL — ABNORMAL LOW (ref 3.5–5.0)
Alkaline Phosphatase: 87 U/L (ref 38–126)
BILIRUBIN TOTAL: 0.5 mg/dL (ref 0.3–1.2)
BUN: 23 mg/dL — ABNORMAL HIGH (ref 6–20)
CO2: 23 mmol/L (ref 22–32)
Calcium: 8.1 mg/dL — ABNORMAL LOW (ref 8.9–10.3)
Chloride: 107 mmol/L (ref 101–111)
Creatinine, Ser: 1.25 mg/dL — ABNORMAL HIGH (ref 0.44–1.00)
GFR calc non Af Amer: 41 mL/min — ABNORMAL LOW (ref >60)
GFR, EST AFRICAN AMERICAN: 47 mL/min — AB (ref >60)
GLUCOSE: 107 mg/dL — AB (ref 65–99)
POTASSIUM: 4.5 mmol/L (ref 3.5–5.1)
Sodium: 138 mmol/L (ref 135–145)
TOTAL PROTEIN: 7.2 g/dL (ref 6.5–8.1)

## 2017-04-23 LAB — CBC WITH DIFFERENTIAL/PLATELET
Basophils Absolute: 0 10*3/uL (ref 0.0–0.1)
Basophils Relative: 1 %
EOS PCT: 1 %
Eosinophils Absolute: 0.1 10*3/uL (ref 0.0–0.7)
HCT: 28.5 % — ABNORMAL LOW (ref 36.0–46.0)
Hemoglobin: 8.9 g/dL — ABNORMAL LOW (ref 12.0–15.0)
LYMPHS PCT: 15 %
Lymphs Abs: 0.9 10*3/uL (ref 0.7–4.0)
MCH: 27.1 pg (ref 26.0–34.0)
MCHC: 31.2 g/dL (ref 30.0–36.0)
MCV: 86.6 fL (ref 78.0–100.0)
MONO ABS: 0.8 10*3/uL (ref 0.1–1.0)
Monocytes Relative: 13 %
Neutro Abs: 4.3 10*3/uL (ref 1.7–7.7)
Neutrophils Relative %: 70 %
PLATELETS: 278 10*3/uL (ref 150–400)
RBC: 3.29 MIL/uL — ABNORMAL LOW (ref 3.87–5.11)
RDW: 15 % (ref 11.5–15.5)
WBC: 6.2 10*3/uL (ref 4.0–10.5)

## 2017-04-23 MED ORDER — PROCHLORPERAZINE MALEATE 10 MG PO TABS
10.0000 mg | ORAL_TABLET | Freq: Once | ORAL | Status: AC
  Administered 2017-04-23: 10 mg via ORAL
  Filled 2017-04-23: qty 1

## 2017-04-23 MED ORDER — BORTEZOMIB CHEMO SQ INJECTION 3.5 MG (2.5MG/ML)
1.0000 mg/m2 | Freq: Once | INTRAMUSCULAR | Status: AC
Start: 2017-04-23 — End: 2017-04-23
  Administered 2017-04-23: 1.75 mg via SUBCUTANEOUS
  Filled 2017-04-23: qty 1.75

## 2017-04-23 MED ORDER — DARBEPOETIN ALFA 60 MCG/0.3ML IJ SOSY
60.0000 ug | PREFILLED_SYRINGE | Freq: Once | INTRAMUSCULAR | Status: AC
  Administered 2017-04-23: 60 ug via SUBCUTANEOUS
  Filled 2017-04-23: qty 0.3

## 2017-04-23 NOTE — Progress Notes (Signed)
Carol Bradley presents today for injection per the provider's orders.  Aranesp and Velcade administrations without incident; see MAR for injection details.  Patient tolerated procedure well and without incident.  No questions or complaints noted at this time. Discharged ambulatory.

## 2017-04-27 ENCOUNTER — Other Ambulatory Visit (HOSPITAL_COMMUNITY): Payer: Self-pay | Admitting: *Deleted

## 2017-04-27 DIAGNOSIS — C9001 Multiple myeloma in remission: Secondary | ICD-10-CM

## 2017-04-30 ENCOUNTER — Encounter (HOSPITAL_COMMUNITY): Payer: Self-pay

## 2017-04-30 ENCOUNTER — Encounter (HOSPITAL_COMMUNITY): Payer: Medicare Other

## 2017-04-30 ENCOUNTER — Encounter (HOSPITAL_BASED_OUTPATIENT_CLINIC_OR_DEPARTMENT_OTHER): Payer: Medicare Other

## 2017-04-30 VITALS — BP 122/39 | HR 62 | Temp 98.1°F | Resp 18

## 2017-04-30 DIAGNOSIS — N183 Chronic kidney disease, stage 3 (moderate): Secondary | ICD-10-CM

## 2017-04-30 DIAGNOSIS — C9001 Multiple myeloma in remission: Secondary | ICD-10-CM

## 2017-04-30 DIAGNOSIS — E538 Deficiency of other specified B group vitamins: Secondary | ICD-10-CM

## 2017-04-30 DIAGNOSIS — C9000 Multiple myeloma not having achieved remission: Secondary | ICD-10-CM

## 2017-04-30 DIAGNOSIS — D631 Anemia in chronic kidney disease: Secondary | ICD-10-CM

## 2017-04-30 DIAGNOSIS — D801 Nonfamilial hypogammaglobulinemia: Secondary | ICD-10-CM

## 2017-04-30 LAB — COMPREHENSIVE METABOLIC PANEL
ALBUMIN: 3.5 g/dL (ref 3.5–5.0)
ALK PHOS: 77 U/L (ref 38–126)
ALT: 13 U/L — AB (ref 14–54)
ANION GAP: 9 (ref 5–15)
AST: 21 U/L (ref 15–41)
BUN: 24 mg/dL — ABNORMAL HIGH (ref 6–20)
CO2: 26 mmol/L (ref 22–32)
Calcium: 8.8 mg/dL — ABNORMAL LOW (ref 8.9–10.3)
Chloride: 102 mmol/L (ref 101–111)
Creatinine, Ser: 1.34 mg/dL — ABNORMAL HIGH (ref 0.44–1.00)
GFR calc Af Amer: 43 mL/min — ABNORMAL LOW (ref >60)
GFR calc non Af Amer: 37 mL/min — ABNORMAL LOW (ref >60)
Glucose, Bld: 102 mg/dL — ABNORMAL HIGH (ref 65–99)
Potassium: 4.5 mmol/L (ref 3.5–5.1)
SODIUM: 137 mmol/L (ref 135–145)
Total Bilirubin: 0.6 mg/dL (ref 0.3–1.2)
Total Protein: 7 g/dL (ref 6.5–8.1)

## 2017-04-30 LAB — CBC WITH DIFFERENTIAL/PLATELET
BASOS PCT: 0 %
Basophils Absolute: 0 10*3/uL (ref 0.0–0.1)
Eosinophils Absolute: 0.1 10*3/uL (ref 0.0–0.7)
Eosinophils Relative: 1 %
HEMATOCRIT: 28.4 % — AB (ref 36.0–46.0)
HEMOGLOBIN: 8.9 g/dL — AB (ref 12.0–15.0)
LYMPHS ABS: 1.2 10*3/uL (ref 0.7–4.0)
Lymphocytes Relative: 17 %
MCH: 27 pg (ref 26.0–34.0)
MCHC: 31.3 g/dL (ref 30.0–36.0)
MCV: 86.1 fL (ref 78.0–100.0)
MONOS PCT: 13 %
Monocytes Absolute: 0.9 10*3/uL (ref 0.1–1.0)
NEUTROS ABS: 5.1 10*3/uL (ref 1.7–7.7)
NEUTROS PCT: 69 %
Platelets: 225 10*3/uL (ref 150–400)
RBC: 3.3 MIL/uL — AB (ref 3.87–5.11)
RDW: 14.8 % (ref 11.5–15.5)
WBC: 7.3 10*3/uL (ref 4.0–10.5)

## 2017-04-30 LAB — MAGNESIUM: MAGNESIUM: 2.2 mg/dL (ref 1.7–2.4)

## 2017-04-30 MED ORDER — SODIUM CHLORIDE 0.9 % IV SOLN
INTRAVENOUS | Status: DC
  Administered 2017-04-30: 14:00:00 via INTRAVENOUS

## 2017-04-30 MED ORDER — ZOLEDRONIC ACID 4 MG/5ML IV CONC
3.0000 mg | Freq: Once | INTRAVENOUS | Status: AC
  Administered 2017-04-30: 3 mg via INTRAVENOUS
  Filled 2017-04-30: qty 3.75

## 2017-04-30 NOTE — Progress Notes (Signed)
Carol Bradley reviewed with pharmacist,Carol Bradley, and corrected Calcium 9.3 was calculated. Pt denied any tooth,jaw or leg pain and any recent or future dental appts prior to administering Zometa. Pt did complain of some SOB,weakness and chest discomfort which has increased some over the last week.Hgb 8.9 Dr. Talbert Cage informed of this information and increased her Aranesp dose to 300 mcg monthly which she will receive in 3 weeks and advised pt to go to ER if her chest discomfort becomes worse. Pt given this information and verbalized understanding. Labs to be repeated next week with Velcade appt. Pt tolerated Zometa infusion well without complaints or incident. VSS upon discharge. Pt discharged self ambulatory in satisfactory condition accompanied by a friend

## 2017-04-30 NOTE — Patient Instructions (Signed)
[place] at [facility] Discharge Instructions  RECOMMENDATIONS MADE BY THE CONSULTANT AND ANY TEST RESULTS WILL BE SENT TO YOUR REFERRING PHYSICIAN.  Received Zometa infusion today. Follow-up as scheduled. Call clinic for any questions or concerns  Thank you for choosing [place] at [facility] to provide your oncology and hematology care.  To afford each patient quality time with our provider, please arrive at least 15 minutes before your scheduled appointment time.    If you have a lab appointment with the [facility] please come in thru the  Main Entrance and check in at the main information desk  You need to re-schedule your appointment should you arrive 10 or more minutes late.  We strive to give you quality time with our providers, and arriving late affects you and other patients whose appointments are after yours.  Also, if you no show three or more times for appointments you may be dismissed from the clinic at the providers discretion.     Again, thank you for choosing [facility].  Our hope is that these requests will decrease the amount of time that you wait before being seen by our physicians.       _____________________________________________________________  Should you have questions after your visit to [facility], please contact our office at [phone] between the hours of 8:30 a.m. and 4:30 p.m.  Voicemails left after 4:30 p.m. will not be returned until the following business day.  For prescription refill requests, have your pharmacy contact our office.       Resources For Cancer Patients and their Caregivers ? American Cancer Society: Can assist with transportation, wigs, general needs, runs Look Good Feel Better.        825-216-3055 ? Cancer Care: Provides financial assistance, online support groups, medication/co-pay assistance.  1-800-813-HOPE 934-419-2209) ? [place] Assists [place] cancer patients and their families through emotional , educational and financial support.  7250559092 ? [place] DSS Where to apply for food stamps, Medicaid and utility assistance. [phone] ? RCATS: Transportation to medical appointments. 501-173-9053 ? Social Security Administration: May apply for disability if have a Stage IV cancer. 313-650-7673 618-243-5240 ? [employer], Disability and Transit Services: Assists with nutrition, care and transit needs. [place] Support Programs: @10RELATIVEDAYS @ > Cancer Support Group  2nd Tuesday of the month 1pm-2pm, Journey Room  > Creative Journey  3rd Tuesday of the month 1130am-1pm, Journey Room  > Look Good Feel Better  1st Wednesday of the month 10am-12 noon, Journey Room (Call [place] to register [phone])

## 2017-05-01 ENCOUNTER — Telehealth: Payer: Self-pay | Admitting: Cardiology

## 2017-05-01 NOTE — Telephone Encounter (Signed)
Returned call to patient she stated her B/P has been low when she goes to cancer center.Stated this morning B/P 122/61 pulse 72.Stated she has been light headed but feels ok at present.Stated she would like appointment to see Dr.Jordan.Appointment scheduled tomorrow with Dr.Jordan 05/02/17 at 11:40 am.

## 2017-05-01 NOTE — Telephone Encounter (Signed)
New message    Pt is calling asking for a call back from Dr. [place] Nurse. Please call.

## 2017-05-02 ENCOUNTER — Encounter: Payer: Self-pay | Admitting: Cardiology

## 2017-05-02 ENCOUNTER — Ambulatory Visit (INDEPENDENT_AMBULATORY_CARE_PROVIDER_SITE_OTHER): Payer: Medicare Other | Admitting: Cardiology

## 2017-05-02 VITALS — BP 124/60 | HR 82 | Ht 67.0 in | Wt 150.0 lb

## 2017-05-02 DIAGNOSIS — I209 Angina pectoris, unspecified: Secondary | ICD-10-CM

## 2017-05-02 DIAGNOSIS — I10 Essential (primary) hypertension: Secondary | ICD-10-CM | POA: Diagnosis not present

## 2017-05-02 DIAGNOSIS — C9000 Multiple myeloma not having achieved remission: Secondary | ICD-10-CM

## 2017-05-02 DIAGNOSIS — I25119 Atherosclerotic heart disease of native coronary artery with unspecified angina pectoris: Secondary | ICD-10-CM | POA: Diagnosis not present

## 2017-05-02 MED ORDER — LOSARTAN POTASSIUM 50 MG PO TABS: 50.0000 mg | ORAL_TABLET | Freq: Every day | ORAL | 3 refills | Status: DC

## 2017-05-02 NOTE — Patient Instructions (Addendum)
We will reduce losartan to 50 mg daily  Continue your other therapy  I will see you in 6 months

## 2017-05-02 NOTE — Progress Notes (Signed)
Carol Bradley Date of Birth: Aug 05, 1940 Medical Record #[id number]  History of Present Illness: Carol Bradley is seen for evaluation of low BP and lightheadedness. She reports this has been noted when she visits the cancer center for her therapy. Last BP readings include 115/44, 122/39, and 129/45, She does have lightheadedness related to this.   She has a history of COPD and mild pulmonary hypertension. She also is a history of coronary disease and is status post stenting of the mid to distal RCA in October 2012. Her presenting symptoms were of dyspnea and not chest pain. She also has labile hypertension. She developed a cough on ACE inhibitors. She developed hyponatremia on HCTZ. She has been on losartan, diltiazem, lopressor.   She is being treated for myeloma with maintenance chemo. She has required periodic blood transfusions for anemia.    Current Outpatient Prescriptions on File Prior to Visit  Medication Sig Dispense Refill  . acyclovir (ZOVIRAX) 200 MG capsule TAKE ONE CAPSULE BY MOUTH TWICE DAILY 180 capsule 1  . aspirin EC 81 MG tablet Take 1 tablet (81 mg total) by mouth daily. 90 tablet 3  . atorvastatin (LIPITOR) 10 MG tablet TAKE ONE TABLET BY MOUTH DAILY 90 tablet 1  . Bortezomib (VELCADE IJ) Inject as directed.    . Calcium Carbonate-Vit D-Min (CALCIUM 600+D PLUS MINERALS) 600-400 MG-UNIT CHEW Chew 2 tablets by mouth daily.    Marland Kitchen diltiazem (CARDIZEM CD) 240 MG 24 hr capsule TAKE ONE CAPSULE BY MOUTH DAILY 30 capsule 6  . magnesium oxide (MAG-OX) 400 (241.3 Mg) MG tablet TAKE ONE TABLET BY MOUTH TWICE DAILY. WHEN TAKING LASIX (Patient taking differently: TAKE ONE TABLET BY MOUTH  DAILY. WHEN TAKING LASIX) 60 tablet 1  . metoprolol tartrate (LOPRESSOR) 25 MG tablet TAKE ONE TABLET BY MOUTH TWICE DAILY 180 tablet 3   No current facility-administered medications on file prior to visit.     Allergies  Allergen Reactions  . Lisinopril Cough  . Plavix [Clopidogrel Bisulfate]  Swelling    Past Medical History:  Diagnosis Date  . 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 CAD (coronary artery disease)   . COPD (chronic obstructive pulmonary disease) ([place])   . History of tobacco abuse   . Hyperlipidemia   . Hypertension   . 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 Multiple myeloma ([place])   . Vitamin B12 deficiency 04/15/2016   Overview:  Vitamin B12 level documented 155, January 2016 with the normal range being 211-924    Past Surgical History:  Procedure Laterality Date  . BACK SURGERY    . CARDIAC CATHETERIZATION  04/2011   right and left cath showing normal right heart pressures,but newly diagnosed coronary artery disease/drug eluting stent placed to RCA with residual disease in the proximal RCA and LAD and ramus, normal LV function and 60-65% EF  . COLONOSCOPY N/A 09/30/2014   Procedure: COLONOSCOPY;  Surgeon: Rogene Houston, MD;  [facility];  Service: Endoscopy;  Laterality: N/A;  225  . CORONARY ANGIOPLASTY WITH STENT PLACEMENT  04/2011   mid RCA: 3.0 X38 mm Promus DES. Residual 40% disease proximally  . NECK SURGERY      History  Smoking Status  . Former Smoker  . Packs/day: 3.00  . Years: 25.00  . Types: Cigarettes  . Quit date: 07/10/1994  Smokeless Tobacco  . Never Used    History  Alcohol Use No    Family History  Problem Relation Age of  Onset  . Heart failure Mother   . Cancer Father     Review of Systems: As noted in HPI.  All other systems were reviewed and are negative.  Physical Exam: BP 124/60   Pulse 82   Ht '5\' 7"'$  (1.702 m)   Wt 150 lb (68 kg)   BMI 23.49 kg/m    Orthostatic BP check: Supine BP 126/60. Pulse 82 Sitting BP 128/64, pulse 70 Standing BP 114/64, pulse 78.  GENERAL:  Well appearing WF in NAD HEENT:  PERRL, EOMI, sclera are clear. Oropharynx is clear. NECK:  No jugular venous distention, carotid upstroke brisk and symmetric, no bruits, no thyromegaly or  adenopathy LUNGS:  Clear to auscultation bilaterally CHEST:  Unremarkable HEART:  RRR,  PMI not displaced or sustained,S1 and S2 within normal limits, no S3, no S4: no clicks, no rubs, no murmurs ABD:  Soft, nontender. BS +, no masses or bruits. No hepatomegaly, no splenomegaly EXT:  2 + pulses throughout, no edema, no cyanosis no clubbing SKIN:  Warm and dry.  No rashes NEURO:  Alert and oriented x 3. Cranial nerves II through XII intact. PSYCH:  Cognitively intact    LABORATORY DATA: Lab Results  Component Value Date   WBC 7.3 04/30/2017   HGB 8.9 (L) 04/30/2017   HCT 28.4 (L) 04/30/2017   PLT 225 04/30/2017   GLUCOSE 102 (H) 04/30/2017   CHOL 156 06/28/2016   TRIG 127 06/28/2016   HDL 61 06/28/2016   LDLCALC 70 06/28/2016   ALT 13 (L) 04/30/2017   AST 21 04/30/2017   NA 137 04/30/2017   K 4.5 04/30/2017   CL 102 04/30/2017   CREATININE 1.34 (H) 04/30/2017   BUN 24 (H) 04/30/2017   CO2 26 04/30/2017   INR 1.04 05/05/2011     Assessment / Plan: 1. Coronary disease status post stenting of the RCA in October of 2012 with a drug-eluting stent.  . Normal  stress Myoview study in June 2016. She is asymptomatic. Follow up in 6 months  2. Hypercholesterolemia. Continue Lipitor therapy. Lipids were good in December.    3. COPD with remote history of tobacco use.  4. Hypertension- BP running low particularly diastolic readings. Will reduce losartan to 50 mg daily. She has frequent BP checks at the cancer center. If it remains low we may need to reduce medication further.  5. CKD stage 3. Creatinine 1.64. Needs to avoid contrast studies if possible due to myeloma.  6. Multiple myeloma- per oncology.   7. 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

## 2017-05-07 ENCOUNTER — Encounter (HOSPITAL_BASED_OUTPATIENT_CLINIC_OR_DEPARTMENT_OTHER): Payer: Medicare Other | Admitting: Oncology

## 2017-05-07 ENCOUNTER — Encounter (HOSPITAL_COMMUNITY): Payer: Medicare Other

## 2017-05-07 ENCOUNTER — Encounter (HOSPITAL_COMMUNITY): Payer: Self-pay

## 2017-05-07 ENCOUNTER — Encounter (HOSPITAL_BASED_OUTPATIENT_CLINIC_OR_DEPARTMENT_OTHER): Payer: Medicare Other

## 2017-05-07 VITALS — BP 128/55 | HR 77 | Temp 97.6°F | Resp 18 | Ht 67.0 in | Wt 147.0 lb

## 2017-05-07 DIAGNOSIS — D631 Anemia in chronic kidney disease: Secondary | ICD-10-CM | POA: Diagnosis not present

## 2017-05-07 DIAGNOSIS — C9001 Multiple myeloma in remission: Secondary | ICD-10-CM

## 2017-05-07 DIAGNOSIS — Z5112 Encounter for antineoplastic immunotherapy: Secondary | ICD-10-CM

## 2017-05-07 DIAGNOSIS — D801 Nonfamilial hypogammaglobulinemia: Secondary | ICD-10-CM | POA: Diagnosis not present

## 2017-05-07 DIAGNOSIS — N183 Chronic kidney disease, stage 3 unspecified: Secondary | ICD-10-CM

## 2017-05-07 DIAGNOSIS — E538 Deficiency of other specified B group vitamins: Secondary | ICD-10-CM | POA: Diagnosis not present

## 2017-05-07 DIAGNOSIS — C9000 Multiple myeloma not having achieved remission: Secondary | ICD-10-CM

## 2017-05-07 LAB — CBC WITH DIFFERENTIAL/PLATELET
BASOS ABS: 0 10*3/uL (ref 0.0–0.1)
BASOS PCT: 0 %
EOS ABS: 0 10*3/uL (ref 0.0–0.7)
Eosinophils Relative: 1 %
HEMATOCRIT: 29.1 % — AB (ref 36.0–46.0)
HEMOGLOBIN: 9.2 g/dL — AB (ref 12.0–15.0)
Lymphocytes Relative: 12 %
Lymphs Abs: 1.1 10*3/uL (ref 0.7–4.0)
MCH: 26.7 pg (ref 26.0–34.0)
MCHC: 31.6 g/dL (ref 30.0–36.0)
MCV: 84.3 fL (ref 78.0–100.0)
Monocytes Absolute: 1.4 10*3/uL — ABNORMAL HIGH (ref 0.1–1.0)
Monocytes Relative: 16 %
NEUTROS ABS: 6.3 10*3/uL (ref 1.7–7.7)
NEUTROS PCT: 71 %
Platelets: 278 10*3/uL (ref 150–400)
RBC: 3.45 MIL/uL — AB (ref 3.87–5.11)
RDW: 14.9 % (ref 11.5–15.5)
WBC: 8.8 10*3/uL (ref 4.0–10.5)

## 2017-05-07 LAB — MAGNESIUM: Magnesium: 2 mg/dL (ref 1.7–2.4)

## 2017-05-07 LAB — COMPREHENSIVE METABOLIC PANEL
ALBUMIN: 3.5 g/dL (ref 3.5–5.0)
ALT: 10 U/L — AB (ref 14–54)
ANION GAP: 8 (ref 5–15)
AST: 20 U/L (ref 15–41)
Alkaline Phosphatase: 75 U/L (ref 38–126)
BUN: 27 mg/dL — AB (ref 6–20)
CALCIUM: 9 mg/dL (ref 8.9–10.3)
CO2: 26 mmol/L (ref 22–32)
CREATININE: 1.53 mg/dL — AB (ref 0.44–1.00)
Chloride: 101 mmol/L (ref 101–111)
GFR calc Af Amer: 37 mL/min — ABNORMAL LOW (ref >60)
GFR calc non Af Amer: 32 mL/min — ABNORMAL LOW (ref >60)
GLUCOSE: 109 mg/dL — AB (ref 65–99)
Potassium: 4.3 mmol/L (ref 3.5–5.1)
Sodium: 135 mmol/L (ref 135–145)
Total Bilirubin: 0.4 mg/dL (ref 0.3–1.2)
Total Protein: 7.4 g/dL (ref 6.5–8.1)

## 2017-05-07 MED ORDER — PROCHLORPERAZINE MALEATE 10 MG PO TABS
ORAL_TABLET | ORAL | Status: AC
  Filled 2017-05-07: qty 1

## 2017-05-07 MED ORDER — CYANOCOBALAMIN 1000 MCG/ML IJ SOLN
1000.0000 ug | Freq: Once | INTRAMUSCULAR | Status: AC
  Administered 2017-05-07: 1000 ug via INTRAMUSCULAR
  Filled 2017-05-07: qty 1

## 2017-05-07 MED ORDER — BORTEZOMIB CHEMO SQ INJECTION 3.5 MG (2.5MG/ML)
1.0000 mg/m2 | Freq: Once | INTRAMUSCULAR | Status: AC
  Administered 2017-05-07: 1.75 mg via SUBCUTANEOUS
  Filled 2017-05-07: qty 1.75

## 2017-05-07 MED ORDER — PROCHLORPERAZINE MALEATE 10 MG PO TABS
10.0000 mg | ORAL_TABLET | Freq: Once | ORAL | Status: AC
  Administered 2017-05-07: 10 mg via ORAL

## 2017-05-07 MED ORDER — DARBEPOETIN ALFA 300 MCG/0.6ML IJ SOSY
300.0000 ug | PREFILLED_SYRINGE | Freq: Once | INTRAMUSCULAR | Status: AC
  Administered 2017-05-07: 300 ug via SUBCUTANEOUS
  Filled 2017-05-07: qty 0.6

## 2017-05-07 NOTE — Patient Instructions (Signed)
[facility] Discharge Instructions for Patients Receiving Chemotherapy   Beginning January 23rd 2017 lab work for the [facility] will be done in the  Main lab at [facility] on 1st floor. If you have a lab appointment with the [facility] please come in thru the  Main Entrance and check in at the main information desk   Today you received the following chemotherapy agents Velcade injection as well as Vit B12 and Aranesp injections . Follow-up as scheduled. Call clinic for any questions or concerns  To help prevent nausea and vomiting after your treatment, we encourage you to take your nausea medication   If you develop nausea and vomiting, or diarrhea that is not controlled by your medication, call the clinic.  The clinic phone number is [phone]. Office hours are Monday-Friday 8:30am-5:00pm.  BELOW ARE SYMPTOMS THAT SHOULD BE REPORTED IMMEDIATELY:  *FEVER GREATER THAN 101.0 F  *CHILLS WITH OR WITHOUT FEVER  NAUSEA AND VOMITING THAT IS NOT CONTROLLED WITH YOUR NAUSEA MEDICATION  *UNUSUAL SHORTNESS OF BREATH  *UNUSUAL BRUISING OR BLEEDING  TENDERNESS IN MOUTH AND THROAT WITH OR WITHOUT PRESENCE OF ULCERS  *URINARY PROBLEMS  *BOWEL PROBLEMS  UNUSUAL RASH Items with * indicate a potential emergency and should be followed up as soon as possible. If you have an emergency after office hours please contact your primary care physician or go to the nearest emergency department.  Please call the clinic during office hours if you have any questions or concerns.   You may also contact the Patient Navigator at [phone] should you have any questions or need assistance in obtaining follow up care.      Resources For Cancer Patients and their Caregivers ? American Cancer Society: Can assist with transportation, wigs, general needs, runs Look Good Feel Better.        ([phone] ? Cancer Care: Provides financial assistance, online support groups,  medication/co-pay assistance.  1-800-813-HOPE (580)241-1072) ? [place] Assists [place] cancer patients and their families through emotional , educational and financial support.  562-526-3697 ? [place] DSS Where to apply for food stamps, Medicaid and utility assistance. 682-471-2758 ? RCATS: Transportation to medical appointments. 954-815-8716 ? Social Security Administration: May apply for disability if have a Stage IV cancer. 431-831-6425 3066129321 ? [employer], Disability and Transit Services: Assists with nutrition, care and transit needs. [phone]

## 2017-05-07 NOTE — Progress Notes (Signed)
Carol Bradley tolerated Velcade, Aranesp and Vit B12 injections well without complaints or incident. Labs reviewed with Dr. Talbert Cage prior to administering these medications. Pt received Compazine PO prior to Velcade injection but pt refused to wait the  30 minutes in between these medications so the Velcade injection was given early.Pt discharged self ambulatory in satisfactory condition accompanied by a friend

## 2017-05-07 NOTE — Progress Notes (Addendum)
PROGRESS NOTE  Carol Chroman, MD [address] [place] [address]  Multiple myeloma in remission [facility]) - Plan: CBC with Differential, Comprehensive metabolic panel, Kappa/lambda light chains, Beta 2 microglobuline, serum, Multiple Myeloma Panel (SPEP&IFE w/QIG)  CURRENT THERAPY: Aranesp every 2 weeks, Zometa 3 mg monthly, B12 injection monthly, and IVIG monthly.  INTERVAL HISTORY: Carol Bradley 76 y.o. female returns for followup of IgG kappa Myeloma, in remission with intolerance to Revlimid/Dexamethasone after taking for many months and declining QOL. AND Hypogammaglobulinemia, on IVIG monthly. AND Anemia in chronic renal disease, Stage III.  On ESA therapy, Aranesp, every 2 weeks.    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       03/18/2015 - 07/28/2015 Chemotherapy    Velcade 1.6 mg/m2 discontinued secondary to intolerance      07/28/2015 Adverse Reaction    stool incontinence, weakness, collapse upon standing, felt to be secondary to velcade      11/09/2015 - 12/13/2015 Chemotherapy    cytoxan IV 300 mg/m2 administered X 2 doses only in addition to rev/dex      11/09/2015 - 06/08/2016 Chemotherapy    Revlimid/Dexamethasone       02/05/2017 -  Chemotherapy    The patient had bortezomib SQ (VELCADE) chemo injection 1.75 mg, 1 mg/m2 = 1.75 mg (66.7 % of original dose 1.5 mg/m2), Subcutaneous,  Once, 1 of 8 cycles Dose modification: 1.5 mg/m2 (original dose 1.5 mg/m2, Cycle 1, Reason: Provider Judgment, Comment: Per Dr. Norma Fredrickson recommendations from [facility] 1 mg/m2 (original dose 1.5 mg/m2, Cycle 1, Reason: Provider Judgment)  for chemotherapy treatment.        She presents today for continued follow-up.  She states that she has been feeling very fatigued due to her anemia.  Otherwise she has no  complaints today. She denied any nausea, vomiting, chest pain, shortness of breath, abdominal pain, constipation, focal weakness.  She denies any recent infections.   Review of Systems  Constitutional: Positive for malaise/fatigue. Negative for chills and fever.  HENT: Negative.  Negative for hearing loss, sore throat and tinnitus.   Eyes: Negative.  Negative for blurred vision, photophobia and discharge.  Respiratory: Negative.  Negative for cough, hemoptysis, shortness of breath and wheezing.   Cardiovascular: Negative for chest pain, palpitations, orthopnea, claudication and leg swelling.  Gastrointestinal: Negative.  Negative for abdominal pain, constipation, diarrhea, melena, nausea and vomiting.  Genitourinary: Negative.  Negative for dysuria and hematuria.  Musculoskeletal: Negative.  Negative for back pain, joint pain and myalgias.  Skin: Negative.  Negative for itching and rash.  Neurological: Negative.  Negative for dizziness, weakness and headaches.  Endo/Heme/Allergies: Negative for environmental allergies and polydipsia. Does not bruise/bleed easily.  Psychiatric/Behavioral: Negative.  Negative for depression. The patient is not nervous/anxious and does not have insomnia.     Past Medical History:  Diagnosis Date  . 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 CAD (coronary artery disease)   . COPD (chronic obstructive pulmonary disease) ([place])   . History of tobacco abuse   . Hyperlipidemia   . Hypertension   . 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 Multiple myeloma ([place])   . Vitamin B12 deficiency 04/15/2016   Overview:  Vitamin B12 level documented 155, January 2016 with the normal range being 211-924  Past Surgical History:  Procedure Laterality Date  . BACK SURGERY    . CARDIAC CATHETERIZATION  04/2011   right and left cath showing normal right heart pressures,but newly diagnosed coronary artery disease/drug eluting stent placed to RCA  with residual disease in the proximal RCA and LAD and ramus, normal LV function and 60-65% EF  . COLONOSCOPY N/A 09/30/2014   Procedure: COLONOSCOPY;  Surgeon: Rogene Houston, MD;  Location: [facility];  Service: Endoscopy;  Laterality: N/A;  225  . CORONARY ANGIOPLASTY WITH STENT PLACEMENT  04/2011   mid RCA: 3.0 X38 mm Promus DES. Residual 40% disease proximally  . NECK SURGERY      Family History  Problem Relation Age of Onset  . Heart failure Mother   . Cancer Father     Social History   Social History  . Marital status: Married    Spouse name: N/A  . Number of children: N/A  . Years of education: N/A   Occupational History  . 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 Smoking status: Former Smoker    Packs/day: 3.00    Years: 25.00    Types: Cigarettes    Quit date: 07/10/1994  . Smokeless tobacco: Never Used  . Alcohol use No  . Drug use: Unknown  . Sexual activity: Not Asked   Other Topics Concern  . None   Social History Narrative  . None     PHYSICAL EXAMINATION  ECOG PERFORMANCE STATUS: 1 - Symptomatic but completely ambulatory   Physical Exam  Constitutional: She is oriented to person, place, and time and well-developed, well-nourished, and in no distress. No distress.  HENT:  Head: Normocephalic and atraumatic.  Mouth/Throat: No oropharyngeal exudate.  Eyes: Pupils are equal, round, and reactive to light. Conjunctivae and EOM are normal. No scleral icterus.  Neck: Normal range of motion. Neck supple. No JVD present.  Cardiovascular: Normal rate, regular rhythm and normal heart sounds.  Exam reveals no gallop and no friction rub.   No murmur heard. Pulmonary/Chest: Effort normal and breath sounds normal. No respiratory distress. She has no wheezes. She has no rales.  Abdominal: Soft. Bowel sounds are normal. She exhibits no distension. There is no tenderness. There is no guarding.  Musculoskeletal: Normal range of motion.  She exhibits no edema or tenderness.  Lymphadenopathy:    She has no cervical adenopathy.  Neurological: She is alert and oriented to person, place, and time. No cranial nerve deficit. Gait normal.  Skin: Skin is warm and dry. No rash noted. No erythema. No pallor.  Psychiatric: Affect and judgment normal.  Nursing note and vitals reviewed.   LABORATORY DATA: CBC    Component Value Date/Time   WBC 8.8 05/07/2017 0909   RBC 3.45 (L) 05/07/2017 0909   HGB 9.2 (L) 05/07/2017 0909   HCT 29.1 (L) 05/07/2017 0909   PLT 278 05/07/2017 0909   MCV 84.3 05/07/2017 0909   MCH 26.7 05/07/2017 0909   MCHC 31.6 05/07/2017 0909   RDW 14.9 05/07/2017 0909   LYMPHSABS 1.1 05/07/2017 0909   MONOABS 1.4 (H) 05/07/2017 0909   EOSABS 0.0 05/07/2017 0909   BASOSABS 0.0 05/07/2017 0909      Chemistry      Component Value Date/Time   NA 135 05/07/2017 0909   K 4.3 05/07/2017 0909   CL 101 05/07/2017 0909   CO2 26 05/07/2017 0909   BUN 27 (H) 05/07/2017 1610  CREATININE 1.53 (H) 05/07/2017 0909      Component Value Date/Time   CALCIUM 9.0 05/07/2017 0909   ALKPHOS 75 05/07/2017 0909   AST 20 05/07/2017 0909   ALT 10 (L) 05/07/2017 0909   BILITOT 0.4 05/07/2017 0909      Lab Results  Component Value Date   PROT 7.4 05/07/2017   ALBUMINELP 3.3 12/11/2016   A1GS 0.3 12/11/2016   A2GS 1.0 12/11/2016   BETS 1.0 12/11/2016   GAMS 1.4 12/11/2016   MSPIKE Not Observed 12/11/2016   SPEI Comment 12/11/2016   SPECOM Comment 12/11/2016   IGGSERUM 1,149 02/08/2017   IGA 176 02/08/2017   IGMSERUM 171 02/08/2017   KPAFRELGTCHN 48.3 (H) 02/08/2017   LAMBDASER 32.3 (H) 02/08/2017   KAPLAMBRATIO 1.50 02/08/2017     ASSESSMENT AND PLAN:  Multiple myeloma ([place]) in remission IgG kappa Myeloma, in remission with intolerance to Revlimid/Dexamethasone after taking for many months.   Continue maintenance Velcade 1 mg/m every 15 days.  Continue Zometa to every 3 months. Continue to monitor  her lab work on a routine basis for relapse of her myeloma. Repeat myeloma labs in 4 weeks. Patient has her 6 month follow up visit with Dr. Norma Fredrickson next month.  Hypogammaglobulinemia (HCC) Hypogammamglobulinemia, on IVIG monthly. Immunoglobulin levels have been good. Per patient, she was told by Dr. Norma Fredrickson that she no longer needed IVIG replacement at this time.  Continue to monitor immunoglobulins.  Vitamin B12 deficiency Continue with maintenance B12 Calcium monthly. Will check a B12 level on her next visit.  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   On ESA therapy, Aranesp. Her anemia has not been responding to the current dose of aranesp 60 mcg monthly. Therefore we will switch her to aranesp 352mg q14 days since she is symptomatic from her anemia. She will get a dose of aranesp today. Iron studies from 10/2016 have been WNL, recheck on next visit.   THERAPY PLAN:  Aranesp every 2 weeks, Zometa 3 mg q345monthB12 injection monthly, and velcade q15 days.  RTC in 2 months for follow up with labs.  Orders Placed This Encounter  Procedures  . CBC with Differential    Standing Status:   Future    Standing Expiration Date:   05/07/2018  . Comprehensive metabolic panel    Standing Status:   Future    Standing Expiration Date:   05/07/2018  . Kappa/lambda light chains    Standing Status:   Future    Standing Expiration Date:   05/07/2018  . Beta 2 microglobuline, serum    Standing Status:   Future    Standing Expiration Date:   05/07/2018  . Multiple Myeloma Panel (SPEP&IFE w/QIG)    Standing Status:   Future    Standing Expiration Date:   05/07/2018  . Vitamin B12    Standing Status:   Future    Standing Expiration Date:   05/08/2018  . Iron and TIBC    Standing Status:   Future    Standing Expiration Date:   05/08/2018  . Ferritin    Standing Status:   Future    Standing Expiration Date:   05/08/2018    All  questions were answered. The patient knows to call the clinic with any problems, questions or concerns. We can certainly see the patient much sooner if necessary.    This note is electronically signed by: LoTwana FirstMD 05/07/2017 10:46 AM

## 2017-05-08 ENCOUNTER — Other Ambulatory Visit: Payer: Self-pay | Admitting: Cardiology

## 2017-05-08 NOTE — Addendum Note (Signed)
Addended by: Twana First on: 05/08/2017 12:15 PM   Modules accepted: Orders

## 2017-05-09 NOTE — Telephone Encounter (Signed)
REFILL 

## 2017-05-19 ENCOUNTER — Other Ambulatory Visit: Payer: Self-pay | Admitting: Cardiology

## 2017-05-21 ENCOUNTER — Encounter (HOSPITAL_BASED_OUTPATIENT_CLINIC_OR_DEPARTMENT_OTHER): Payer: Medicare Other

## 2017-05-21 ENCOUNTER — Ambulatory Visit (HOSPITAL_COMMUNITY): Payer: Medicare Other

## 2017-05-21 ENCOUNTER — Encounter (HOSPITAL_COMMUNITY): Payer: Self-pay

## 2017-05-21 ENCOUNTER — Other Ambulatory Visit (HOSPITAL_COMMUNITY): Payer: Medicare Other

## 2017-05-21 ENCOUNTER — Encounter (HOSPITAL_COMMUNITY): Payer: Medicare Other | Attending: Oncology

## 2017-05-21 VITALS — BP 117/43 | HR 69 | Temp 97.9°F | Resp 18 | Wt 148.9 lb

## 2017-05-21 DIAGNOSIS — C9001 Multiple myeloma in remission: Secondary | ICD-10-CM | POA: Insufficient documentation

## 2017-05-21 DIAGNOSIS — N183 Chronic kidney disease, stage 3 unspecified: Secondary | ICD-10-CM

## 2017-05-21 DIAGNOSIS — D631 Anemia in chronic kidney disease: Secondary | ICD-10-CM

## 2017-05-21 DIAGNOSIS — D801 Nonfamilial hypogammaglobulinemia: Secondary | ICD-10-CM

## 2017-05-21 DIAGNOSIS — E538 Deficiency of other specified B group vitamins: Secondary | ICD-10-CM | POA: Diagnosis not present

## 2017-05-21 DIAGNOSIS — C9000 Multiple myeloma not having achieved remission: Secondary | ICD-10-CM

## 2017-05-21 LAB — CBC WITH DIFFERENTIAL/PLATELET
BASOS ABS: 0 10*3/uL (ref 0.0–0.1)
BASOS PCT: 0 %
EOS ABS: 0 10*3/uL (ref 0.0–0.7)
Eosinophils Relative: 0 %
HCT: 27.3 % — ABNORMAL LOW (ref 36.0–46.0)
HEMOGLOBIN: 8.2 g/dL — AB (ref 12.0–15.0)
LYMPHS ABS: 1.2 10*3/uL (ref 0.7–4.0)
Lymphocytes Relative: 13 %
MCH: 25.2 pg — AB (ref 26.0–34.0)
MCHC: 30 g/dL (ref 30.0–36.0)
MCV: 83.7 fL (ref 78.0–100.0)
MONO ABS: 1.3 10*3/uL — AB (ref 0.1–1.0)
MONOS PCT: 13 %
NEUTROS PCT: 74 %
Neutro Abs: 7.3 10*3/uL (ref 1.7–7.7)
PLATELETS: 294 10*3/uL (ref 150–400)
RBC: 3.26 MIL/uL — ABNORMAL LOW (ref 3.87–5.11)
RDW: 14.9 % (ref 11.5–15.5)
WBC: 9.9 10*3/uL (ref 4.0–10.5)

## 2017-05-21 LAB — COMPREHENSIVE METABOLIC PANEL
ALBUMIN: 3.5 g/dL (ref 3.5–5.0)
ALK PHOS: 69 U/L (ref 38–126)
ALT: 11 U/L — AB (ref 14–54)
AST: 20 U/L (ref 15–41)
Anion gap: 9 (ref 5–15)
BUN: 26 mg/dL — AB (ref 6–20)
CALCIUM: 8.9 mg/dL (ref 8.9–10.3)
CHLORIDE: 102 mmol/L (ref 101–111)
CO2: 25 mmol/L (ref 22–32)
CREATININE: 1.3 mg/dL — AB (ref 0.44–1.00)
GFR calc non Af Amer: 39 mL/min — ABNORMAL LOW (ref >60)
GFR, EST AFRICAN AMERICAN: 45 mL/min — AB (ref >60)
GLUCOSE: 99 mg/dL (ref 65–99)
Potassium: 4.1 mmol/L (ref 3.5–5.1)
SODIUM: 136 mmol/L (ref 135–145)
Total Bilirubin: 0.1 mg/dL — ABNORMAL LOW (ref 0.3–1.2)
Total Protein: 7.1 g/dL (ref 6.5–8.1)

## 2017-05-21 LAB — PREPARE RBC (CROSSMATCH)

## 2017-05-21 MED ORDER — DARBEPOETIN ALFA 300 MCG/0.6ML IJ SOSY
300.0000 ug | PREFILLED_SYRINGE | Freq: Once | INTRAMUSCULAR | Status: AC
  Administered 2017-05-21: 300 ug via SUBCUTANEOUS
  Filled 2017-05-21: qty 0.6

## 2017-05-21 NOTE — Progress Notes (Signed)
Patient for velcade and aranesp shot today.  Patient complains of increase SOB and rates back pain 8 or 9 on scale with movement.  Stated aleve does provide some relief.  Reviewed labs with Dr. Talbert Cage and patient requesting a blood transfusion for tomorrow.  Patient to received aranesp today and velcade tomorrow with blood transfusion.  Pharmacy made aware of patients request for velcade tomorrow.   Patient tolerated aranesp shot with no complaints voiced.  Injection site clean and dry with no bruising or swelling noted at site.  Band aid applied.  VSS with discharge and left ambulatory with friend.

## 2017-05-21 NOTE — Telephone Encounter (Signed)
Rx has been sent to the pharmacy electronically. ° °

## 2017-05-21 NOTE — Patient Instructions (Signed)
[facility]  Discharge Instructions:  You received an aranesp shot today.  _______________________________________________________________  Thank you for choosing [facility] to provide your oncology and hematology care.  To afford each patient quality time with our providers, please arrive at least 15 minutes before your scheduled appointment.  You need to re-schedule your appointment if you arrive 10 or more minutes late.  We strive to give you quality time with our providers, and arriving late affects you and other patients whose appointments are after yours.  Also, if you no show three or more times for appointments you may be dismissed from the clinic.  Again, thank you for choosing [facility]. Our hope is that these requests will allow you access to exceptional care and in a timely manner. _______________________________________________________________  If you have questions after your visit, please contact our office at [phone] between the hours of 8:30 a.m. and 5:00 p.m. Voicemails left after 4:30 p.m. will not be returned until the following business day. _______________________________________________________________  For prescription refill requests, have your pharmacy contact our office. _______________________________________________________________  Recommendations made by the consultant and any test results will be sent to your referring physician. _______________________________________________________________ 

## 2017-05-22 ENCOUNTER — Encounter (HOSPITAL_BASED_OUTPATIENT_CLINIC_OR_DEPARTMENT_OTHER): Payer: Medicare Other

## 2017-05-22 VITALS — BP 100/79 | HR 79 | Temp 98.6°F | Resp 18 | Wt 149.8 lb

## 2017-05-22 DIAGNOSIS — C9000 Multiple myeloma not having achieved remission: Secondary | ICD-10-CM

## 2017-05-22 DIAGNOSIS — C9001 Multiple myeloma in remission: Secondary | ICD-10-CM | POA: Diagnosis not present

## 2017-05-22 DIAGNOSIS — Z5112 Encounter for antineoplastic immunotherapy: Secondary | ICD-10-CM | POA: Diagnosis present

## 2017-05-22 MED ORDER — BORTEZOMIB CHEMO SQ INJECTION 3.5 MG (2.5MG/ML)
1.0000 mg/m2 | Freq: Once | INTRAMUSCULAR | Status: AC
  Administered 2017-05-22: 1.75 mg via SUBCUTANEOUS
  Filled 2017-05-22: qty 1.75

## 2017-05-22 MED ORDER — PROCHLORPERAZINE MALEATE 10 MG PO TABS
10.0000 mg | ORAL_TABLET | Freq: Once | ORAL | Status: AC
  Administered 2017-05-22: 10 mg via ORAL
  Filled 2017-05-22: qty 1

## 2017-05-22 MED ORDER — SODIUM CHLORIDE 0.9 % IV SOLN
250.0000 mL | Freq: Once | INTRAVENOUS | Status: AC
  Administered 2017-05-22: 250 mL via INTRAVENOUS

## 2017-05-22 MED ORDER — DIPHENHYDRAMINE HCL 25 MG PO CAPS
ORAL_CAPSULE | ORAL | Status: AC
  Filled 2017-05-22: qty 1

## 2017-05-22 MED ORDER — DIPHENHYDRAMINE HCL 25 MG PO CAPS
25.0000 mg | ORAL_CAPSULE | Freq: Once | ORAL | Status: AC
  Administered 2017-05-22: 25 mg via ORAL

## 2017-05-22 MED ORDER — ACETAMINOPHEN 325 MG PO TABS
ORAL_TABLET | ORAL | Status: AC
  Filled 2017-05-22: qty 2

## 2017-05-22 MED ORDER — ACETAMINOPHEN 325 MG PO TABS
650.0000 mg | ORAL_TABLET | Freq: Once | ORAL | Status: AC
  Administered 2017-05-22: 650 mg via ORAL

## 2017-05-22 NOTE — Progress Notes (Signed)
Tolerated transfusion w/o adverse reaction.  Alert, in no distress.  VSS.  Velcade administration without incident; see MAR for injection details.  Patient tolerated procedure well and without incident.  No questions or complaints noted at this time.  Discharged ambulatory in c/o friend.

## 2017-05-23 ENCOUNTER — Telehealth (HOSPITAL_COMMUNITY): Payer: Self-pay

## 2017-05-23 DIAGNOSIS — J019 Acute sinusitis, unspecified: Secondary | ICD-10-CM

## 2017-05-23 LAB — BPAM RBC
Blood Product Expiration Date: 201812062359
ISSUE DATE / TIME: 201811130849
UNIT TYPE AND RH: 5100

## 2017-05-23 LAB — TYPE AND SCREEN
ABO/RH(D): O POS
Antibody Screen: NEGATIVE
Unit division: 0

## 2017-05-23 MED ORDER — LEVOFLOXACIN 500 MG PO TABS: 500.0000 mg | ORAL_TABLET | Freq: Every day | ORAL | 0 refills | Status: DC

## 2017-05-23 NOTE — Telephone Encounter (Signed)
Patient called stating she has a sinus infection. She has head congestion and is coughing up green to bloody colored mucus. Her eyes were matted shut this am when she got up. Her nose is running. She has been running low grade fevers. She has tried mucinex without much relief. Reviewed with provider. Levaquin ordered, 1 pill daily for 7 days with no refills. Prescription e scribed to patients pharmacy . Patient notified and verbalized understanding.

## 2017-05-25 ENCOUNTER — Encounter (HOSPITAL_COMMUNITY): Payer: Self-pay | Admitting: *Deleted

## 2017-05-25 ENCOUNTER — Emergency Department (HOSPITAL_COMMUNITY)
Admission: EM | Admit: 2017-05-25 | Discharge: 2017-05-25 | Disposition: A | Discharge status: Home / Self Care | Payer: Medicare Other | Attending: Emergency Medicine | Admitting: Emergency Medicine

## 2017-05-25 ENCOUNTER — Other Ambulatory Visit: Payer: Self-pay

## 2017-05-25 ENCOUNTER — Telehealth (HOSPITAL_COMMUNITY): Payer: Self-pay | Admitting: *Deleted

## 2017-05-25 ENCOUNTER — Emergency Department (HOSPITAL_COMMUNITY): Payer: Medicare Other

## 2017-05-25 ENCOUNTER — Other Ambulatory Visit (HOSPITAL_COMMUNITY): Payer: Self-pay | Admitting: Oncology

## 2017-05-25 DIAGNOSIS — D631 Anemia in chronic kidney disease: Secondary | ICD-10-CM | POA: Insufficient documentation

## 2017-05-25 DIAGNOSIS — I2510 Atherosclerotic heart disease of native coronary artery without angina pectoris: Secondary | ICD-10-CM | POA: Diagnosis not present

## 2017-05-25 DIAGNOSIS — I5032 Chronic diastolic (congestive) heart failure: Secondary | ICD-10-CM | POA: Insufficient documentation

## 2017-05-25 DIAGNOSIS — Z79899 Other long term (current) drug therapy: Secondary | ICD-10-CM | POA: Diagnosis not present

## 2017-05-25 DIAGNOSIS — E860 Dehydration: Secondary | ICD-10-CM | POA: Insufficient documentation

## 2017-05-25 DIAGNOSIS — I130 Hypertensive heart and chronic kidney disease with heart failure and stage 1 through stage 4 chronic kidney disease, or unspecified chronic kidney disease: Secondary | ICD-10-CM | POA: Diagnosis not present

## 2017-05-25 DIAGNOSIS — Z7982 Long term (current) use of aspirin: Secondary | ICD-10-CM | POA: Diagnosis not present

## 2017-05-25 DIAGNOSIS — Z87891 Personal history of nicotine dependence: Secondary | ICD-10-CM | POA: Insufficient documentation

## 2017-05-25 DIAGNOSIS — J449 Chronic obstructive pulmonary disease, unspecified: Secondary | ICD-10-CM | POA: Insufficient documentation

## 2017-05-25 DIAGNOSIS — N183 Chronic kidney disease, stage 3 (moderate): Secondary | ICD-10-CM | POA: Diagnosis not present

## 2017-05-25 DIAGNOSIS — R000 Tachycardia, unspecified: Secondary | ICD-10-CM | POA: Insufficient documentation

## 2017-05-25 LAB — COMPREHENSIVE METABOLIC PANEL
ALT: 11 U/L — AB (ref 14–54)
AST: 26 U/L (ref 15–41)
Albumin: 3.5 g/dL (ref 3.5–5.0)
Alkaline Phosphatase: 80 U/L (ref 38–126)
Anion gap: 9 (ref 5–15)
BILIRUBIN TOTAL: 0.5 mg/dL (ref 0.3–1.2)
BUN: 20 mg/dL (ref 6–20)
CALCIUM: 9.1 mg/dL (ref 8.9–10.3)
CO2: 25 mmol/L (ref 22–32)
CREATININE: 1.23 mg/dL — AB (ref 0.44–1.00)
Chloride: 99 mmol/L — ABNORMAL LOW (ref 101–111)
GFR, EST AFRICAN AMERICAN: 48 mL/min — AB (ref >60)
GFR, EST NON AFRICAN AMERICAN: 42 mL/min — AB (ref >60)
Glucose, Bld: 126 mg/dL — ABNORMAL HIGH (ref 65–99)
Potassium: 4.1 mmol/L (ref 3.5–5.1)
Sodium: 133 mmol/L — ABNORMAL LOW (ref 135–145)
TOTAL PROTEIN: 7.6 g/dL (ref 6.5–8.1)

## 2017-05-25 LAB — URINALYSIS, ROUTINE W REFLEX MICROSCOPIC
BILIRUBIN URINE: NEGATIVE
GLUCOSE, UA: NEGATIVE mg/dL
KETONES UR: NEGATIVE mg/dL
LEUKOCYTES UA: NEGATIVE
Nitrite: NEGATIVE
PROTEIN: NEGATIVE mg/dL
Specific Gravity, Urine: 1.006 (ref 1.005–1.030)
pH: 6 (ref 5.0–8.0)

## 2017-05-25 LAB — CBC WITH DIFFERENTIAL/PLATELET
BASOS ABS: 0 10*3/uL (ref 0.0–0.1)
Basophils Relative: 1 %
EOS PCT: 0 %
Eosinophils Absolute: 0 10*3/uL (ref 0.0–0.7)
HEMATOCRIT: 35.2 % — AB (ref 36.0–46.0)
Hemoglobin: 11.1 g/dL — ABNORMAL LOW (ref 12.0–15.0)
LYMPHS ABS: 0.7 10*3/uL (ref 0.7–4.0)
LYMPHS PCT: 15 %
MCH: 26.1 pg (ref 26.0–34.0)
MCHC: 31.5 g/dL (ref 30.0–36.0)
MCV: 82.6 fL (ref 78.0–100.0)
MONO ABS: 0.7 10*3/uL (ref 0.1–1.0)
Monocytes Relative: 17 %
NEUTROS ABS: 2.9 10*3/uL (ref 1.7–7.7)
Neutrophils Relative %: 67 %
Platelets: 242 10*3/uL (ref 150–400)
RBC: 4.26 MIL/uL (ref 3.87–5.11)
RDW: 15.5 % (ref 11.5–15.5)
WBC: 4.3 10*3/uL (ref 4.0–10.5)

## 2017-05-25 LAB — TROPONIN I

## 2017-05-25 MED ORDER — ONDANSETRON HCL 4 MG/2ML IJ SOLN
4.0000 mg | Freq: Once | INTRAMUSCULAR | Status: AC
  Administered 2017-05-25: 4 mg via INTRAVENOUS
  Filled 2017-05-25: qty 2

## 2017-05-25 MED ORDER — SODIUM CHLORIDE 0.9 % IV BOLUS (SEPSIS)
1000.0000 mL | Freq: Once | INTRAVENOUS | Status: AC
  Administered 2017-05-25: 1000 mL via INTRAVENOUS

## 2017-05-25 NOTE — ED Provider Notes (Signed)
[facility] EMERGENCY DEPARTMENT Provider Note   CSN: [id number] Arrival date & time: 05/25/17  1236     History   Chief Complaint Chief Complaint  Patient presents with  . Tachycardia    HPI Carol Bradley is a 76 y.o. female.  Patient awoke at 4 AM today with a sensation of tachycardia and diaphoresis for approximately 1 hour.  The symptoms have improved.  She feels dehydrated and lethargic.  She is currently being treated for multiple myeloma with chemotherapy.   No specific fever, sweats, chills, chest pain, dyspnea, dysuria.  Severity of symptoms is mild to moderate.  She was anemic earlier in the week requiring a transfusion.      Past Medical History:  Diagnosis Date  . 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 CAD (coronary artery disease)   . COPD (chronic obstructive pulmonary disease) ([place])   . History of tobacco abuse   . Hyperlipidemia   . Hypertension   . 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 Multiple myeloma ([place])   . Vitamin B12 deficiency 04/15/2016   Overview:  Vitamin B12 level documented 155, January 2016 with the normal range being 211-924    Patient Active Problem List   Diagnosis Date Noted  . Vitamin B12 deficiency 04/15/2016  . Goals of care, counseling/discussion 04/15/2016  . 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 Emphysema/COPD ([place]) 01/15/2016  . Chronic diastolic congestive heart failure ([place]) 12/13/2015  . Hypokalemia 11/02/2015  . History of heart disease 11/02/2015  . 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 Incontinence of bowel 08/24/2015  . 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 Severe low back pain 05/10/2015  . Multiple myeloma ([place]) 04/12/2015  . Anemia due to bone marrow failure ([place]) 04/02/2015  . Cramps, extremity 04/01/2015  . Encounter for chemotherapy management 04/01/2015  . Hematoma of hip, right, sequela  04/01/2015  . Chronic renal disease, stage 3, moderately decreased glomerular filtration rate between 30-59 mL/min/1.73 square meter (HCC) 02/12/2015  . Elevated serum protein level 02/12/2015  . Hypoalbuminemia 02/12/2015  . Dyspnea on exertion 04/29/2014  . Dyspepsia 05/19/2011  . CAD (coronary artery disease)   . Hypertension   . Hyperlipidemia     Past Surgical History:  Procedure Laterality Date  . BACK SURGERY    . CARDIAC CATHETERIZATION  04/2011   right and left cath showing normal right heart pressures,but newly diagnosed coronary artery disease/drug eluting stent placed to RCA with residual disease in the proximal RCA and LAD and ramus, normal LV function and 60-65% EF  . COLONOSCOPY N/A 09/30/2014   Performed by Rogene Houston, MD at [place]  . CORONARY ANGIOPLASTY WITH STENT PLACEMENT  04/2011   mid RCA: 3.0 X38 mm Promus DES. Residual 40% disease proximally  . NECK SURGERY      OB History    No data available       Home Medications    Prior to Admission medications   Medication Sig Start Date End Date Taking? Authorizing Provider  acyclovir (ZOVIRAX) 200 MG capsule TAKE ONE CAPSULE BY MOUTH TWICE DAILY 02/19/17  Yes Holley Bouche, NP  aspirin EC 81 MG tablet Take 1 tablet (81 mg total) by mouth daily. 01/09/17  Yes [place], Peter M, MD  atorvastatin (LIPITOR) 10 MG tablet TAKE ONE TABLET BY MOUTH DAILY 05/21/17  Yes [place], Peter M, MD  Calcium Carbonate-Vit D-Min (CALCIUM 600+D  PLUS MINERALS) 600-400 MG-UNIT CHEW Chew 1 tablet 2 (two) times daily by mouth.    Yes [provider]  diltiazem (CARDIZEM CD) 240 MG 24 hr capsule TAKE ONE CAPSULE BY MOUTH DAILY 05/09/17  Yes [place], Peter M, MD  guaiFENesin (MUCINEX) 600 MG 12 hr tablet Take 600 mg 2 (two) times daily as needed by mouth for cough or to loosen phlegm.   Yes [provider]  levofloxacin (LEVAQUIN) 500 MG tablet Take 1 tablet (500 mg total) daily by mouth. 05/23/17  Yes Twana First, MD  losartan (COZAAR) 50 MG tablet Take 1 tablet (50 mg total) by mouth daily. Patient taking differently: Take 50 mg 2 (two) times daily by mouth.  05/02/17 04/27/18 Yes [place], Peter M, MD  magnesium oxide (MAG-OX) 400 (241.3 Mg) MG tablet TAKE ONE TABLET BY MOUTH TWICE DAILY. WHEN TAKING LASIX Patient taking differently: TAKE ONE TABLET BY MOUTH  DAILY. WHEN TAKING LASIX 01/07/17  Yes Kefalas, Manon Hilding, PA-C  metoprolol tartrate (LOPRESSOR) 25 MG tablet TAKE ONE TABLET BY MOUTH TWICE DAILY 10/16/16  Yes [place], Peter M, MD  potassium chloride (K-DUR,KLOR-CON) 10 MEQ tablet Take 10 mEq 2 (two) times daily by mouth.   Yes [provider]  Bortezomib (VELCADE IJ) Inject as directed.    [provider]    Family History Family History  Problem Relation Age of Onset  . Heart failure Mother   . Cancer Father     Social History Social History   Tobacco Use  . Smoking status: Former Smoker    Packs/day: 3.00    Years: 25.00    Pack years: 75.00    Types: Cigarettes    Last attempt to quit: 07/10/1994    Years since quitting: 22.8  . Smokeless tobacco: Never Used  Substance Use Topics  . Alcohol use: No  . Drug use: Not on file     Allergies   Lisinopril and Plavix [clopidogrel bisulfate]   Review of Systems Review of Systems  All other systems reviewed and are negative.    Physical Exam Updated Vital Signs BP (!) 130/57   Pulse 72   Temp 98 F (36.7 C)   Resp 16   Ht '5\' 7"'$  (1.702 m)   Wt 67.6 kg (149 lb)   SpO2 98%   BMI 23.34 kg/m   Physical Exam  Constitutional: She is oriented to person, place, and time.  Slightly dehydrated.  HENT:  Head: Normocephalic and atraumatic.  Eyes: Conjunctivae are normal.  Neck: Neck supple.  Cardiovascular: Normal rate and regular rhythm.  Pulmonary/Chest: Effort normal and breath sounds normal.  Abdominal: Soft. Bowel sounds are normal.  Musculoskeletal: Normal range of motion.  Neurological: She is  alert and oriented to person, place, and time.  Skin: Skin is warm and dry.  Psychiatric: She has a normal mood and affect. Her behavior is normal.  Nursing note and vitals reviewed.    ED Treatments / Results  Labs (all labs ordered are listed, but only abnormal results are displayed) Labs Reviewed  CBC WITH DIFFERENTIAL/PLATELET - Abnormal; Notable for the following components:      Result Value   Hemoglobin 11.1 (*)    HCT 35.2 (*)    All other components within normal limits  COMPREHENSIVE METABOLIC PANEL - Abnormal; Notable for the following components:   Sodium 133 (*)    Chloride 99 (*)    Glucose, Bld 126 (*)    Creatinine, Ser 1.23 (*)  ALT 11 (*)    GFR calc non Af Amer 42 (*)    GFR calc Af Amer 48 (*)    All other components within normal limits  URINALYSIS, ROUTINE W REFLEX MICROSCOPIC - Abnormal; Notable for the following components:   Color, Urine STRAW (*)    APPearance HAZY (*)    Hgb urine dipstick SMALL (*)    Bacteria, UA RARE (*)    Squamous Epithelial / LPF 0-5 (*)    All other components within normal limits  TROPONIN I    EKG  EKG Interpretation None      Date: 05/25/2017  Rate: 75  Rhythm: normal sinus rhythm  QRS Axis: normal  Intervals: normal  ST/T Wave abnormalities: normal  Conduction Disutrbances: none  Narrative Interpretation: unremarkable     Radiology Dg Chest 2 View  Result Date: 05/25/2017 CLINICAL DATA:  Irregular heart rate and shortness of breath this morning. Undergoing chemotherapy for multiple myeloma. EXAM: CHEST  2 VIEW COMPARISON:  10/03/2016. FINDINGS: Normal sized heart. The lungs remain clear and hyperexpanded with mild diffuse peribronchial thickening. Mild thoracic spine degenerative changes. Cervical spine fixation hardware. IMPRESSION: No acute abnormality. Stable mild changes of COPD and chronic bronchitis. Electronically Signed   By: Claudie Revering M.D.   On: 05/25/2017 13:35    Procedures Procedures  (including critical care time)  Medications Ordered in ED Medications  sodium chloride 0.9 % bolus 1,000 mL (0 mLs Intravenous Stopped 05/25/17 1420)  ondansetron (ZOFRAN) injection 4 mg (4 mg Intravenous Given 05/25/17 1407)     Initial Impression / Assessment and Plan / ED Course  I have reviewed the triage vital signs and the nursing notes.  Pertinent labs & imaging results that were available during my care of the patient were reviewed by me and considered in my medical decision making (see chart for details).     Patient is hemodynamically stable.  EKG, chest x-ray, troponin, labs all reasonable.  She feels better after 1 L of IV fluids.  Discussed findings with the patient and her son.  She is stable for discharge.  Final Clinical Impressions(s) / ED Diagnoses   Final diagnoses:  Dehydration  Tachycardia    ED Discharge Orders    None       Nat Christen, MD 05/25/17 [phone]

## 2017-05-25 NOTE — Telephone Encounter (Signed)
Patient called our office to ask for advice. She has been having symptoms of heart racing, sweating, nausea, heaviness in chest since last night and she doesn't know what to do.  I advised patient to report to the emergency room immediately for evaluation.  I spoke with Mike Craze, NP who agrees with that assessment and recommendation.  Patient verbalizes intent of coming to the ER at this time.

## 2017-05-25 NOTE — ED Triage Notes (Signed)
States she had an irregular and fast heart rate this am. Better at present. Patient is a chemo patient

## 2017-05-25 NOTE — Discharge Instructions (Signed)
Tests showed no life-threatening condition.  Increase fluids.  Reduce your caffeine consumption.  Follow-up with your oncologist.

## 2017-06-04 ENCOUNTER — Encounter (HOSPITAL_BASED_OUTPATIENT_CLINIC_OR_DEPARTMENT_OTHER): Payer: Medicare Other

## 2017-06-04 ENCOUNTER — Encounter (HOSPITAL_COMMUNITY): Payer: Medicare Other

## 2017-06-04 ENCOUNTER — Other Ambulatory Visit: Payer: Self-pay

## 2017-06-04 ENCOUNTER — Encounter (HOSPITAL_COMMUNITY): Payer: Self-pay

## 2017-06-04 VITALS — BP 121/45 | HR 67 | Temp 97.8°F | Resp 20 | Wt 147.5 lb

## 2017-06-04 DIAGNOSIS — Z5112 Encounter for antineoplastic immunotherapy: Secondary | ICD-10-CM

## 2017-06-04 DIAGNOSIS — C9001 Multiple myeloma in remission: Secondary | ICD-10-CM

## 2017-06-04 DIAGNOSIS — E538 Deficiency of other specified B group vitamins: Secondary | ICD-10-CM

## 2017-06-04 DIAGNOSIS — C9000 Multiple myeloma not having achieved remission: Secondary | ICD-10-CM

## 2017-06-04 LAB — COMPREHENSIVE METABOLIC PANEL
ALBUMIN: 3.5 g/dL (ref 3.5–5.0)
ALK PHOS: 72 U/L (ref 38–126)
ALT: 11 U/L — AB (ref 14–54)
AST: 21 U/L (ref 15–41)
Anion gap: 8 (ref 5–15)
BUN: 25 mg/dL — AB (ref 6–20)
CALCIUM: 9 mg/dL (ref 8.9–10.3)
CO2: 27 mmol/L (ref 22–32)
CREATININE: 1.28 mg/dL — AB (ref 0.44–1.00)
Chloride: 102 mmol/L (ref 101–111)
GFR calc Af Amer: 46 mL/min — ABNORMAL LOW (ref >60)
GFR calc non Af Amer: 40 mL/min — ABNORMAL LOW (ref >60)
GLUCOSE: 104 mg/dL — AB (ref 65–99)
Potassium: 4.2 mmol/L (ref 3.5–5.1)
SODIUM: 137 mmol/L (ref 135–145)
Total Bilirubin: 0.5 mg/dL (ref 0.3–1.2)
Total Protein: 7.2 g/dL (ref 6.5–8.1)

## 2017-06-04 LAB — CBC WITH DIFFERENTIAL/PLATELET
Basophils Absolute: 0 10*3/uL (ref 0.0–0.1)
Basophils Relative: 0 %
EOS ABS: 0 10*3/uL (ref 0.0–0.7)
Eosinophils Relative: 0 %
HCT: 34.9 % — ABNORMAL LOW (ref 36.0–46.0)
HEMOGLOBIN: 10.4 g/dL — AB (ref 12.0–15.0)
LYMPHS ABS: 1.3 10*3/uL (ref 0.7–4.0)
Lymphocytes Relative: 14 %
MCH: 24.6 pg — AB (ref 26.0–34.0)
MCHC: 29.8 g/dL — ABNORMAL LOW (ref 30.0–36.0)
MCV: 82.5 fL (ref 78.0–100.0)
Monocytes Absolute: 1.2 10*3/uL — ABNORMAL HIGH (ref 0.1–1.0)
Monocytes Relative: 12 %
NEUTROS PCT: 74 %
Neutro Abs: 7.1 10*3/uL (ref 1.7–7.7)
Platelets: 333 10*3/uL (ref 150–400)
RBC: 4.23 MIL/uL (ref 3.87–5.11)
RDW: 15.7 % — ABNORMAL HIGH (ref 11.5–15.5)
WBC: 9.6 10*3/uL (ref 4.0–10.5)

## 2017-06-04 MED ORDER — CYANOCOBALAMIN 1000 MCG/ML IJ SOLN
1000.0000 ug | Freq: Once | INTRAMUSCULAR | Status: AC
  Administered 2017-06-04: 1000 ug via INTRAMUSCULAR
  Filled 2017-06-04: qty 1

## 2017-06-04 MED ORDER — PROCHLORPERAZINE MALEATE 10 MG PO TABS: 10.0000 mg | ORAL_TABLET | Freq: Once | ORAL | Status: DC

## 2017-06-04 MED ORDER — BORTEZOMIB CHEMO SQ INJECTION 3.5 MG (2.5MG/ML)
1.0000 mg/m2 | Freq: Once | INTRAMUSCULAR | Status: AC
  Administered 2017-06-04: 1.75 mg via SUBCUTANEOUS
  Filled 2017-06-04: qty 1.75

## 2017-06-04 NOTE — Patient Instructions (Signed)
[facility] Discharge Instructions for Patients Receiving Chemotherapy   Beginning January 23rd 2017 lab work for the [facility] will be done in the  Main lab at [facility] on 1st floor. If you have a lab appointment with the [facility] please come in thru the  Main Entrance and check in at the main information desk  B12 injection today Today you received the following chemotherapy agents   To help prevent nausea and vomiting after your treatment, we encourage you to take your nausea medication    If you develop nausea and vomiting, or diarrhea that is not controlled by your medication, call the clinic.  The clinic phone number is [phone]. Office hours are Monday-Friday 8:30am-5:00pm.  BELOW ARE SYMPTOMS THAT SHOULD BE REPORTED IMMEDIATELY:  *FEVER GREATER THAN 101.0 F  *CHILLS WITH OR WITHOUT FEVER  NAUSEA AND VOMITING THAT IS NOT CONTROLLED WITH YOUR NAUSEA MEDICATION  *UNUSUAL SHORTNESS OF BREATH  *UNUSUAL BRUISING OR BLEEDING  TENDERNESS IN MOUTH AND THROAT WITH OR WITHOUT PRESENCE OF ULCERS  *URINARY PROBLEMS  *BOWEL PROBLEMS  UNUSUAL RASH Items with * indicate a potential emergency and should be followed up as soon as possible. If you have an emergency after office hours please contact your primary care physician or go to the nearest emergency department.  Please call the clinic during office hours if you have any questions or concerns.   You may also contact the Patient Navigator at [phone] should you have any questions or need assistance in obtaining follow up care.      Resources For Cancer Patients and their Caregivers ? American Cancer Society: Can assist with transportation, wigs, general needs, runs Look Good Feel Better.        (251)020-0910 ? Cancer Care: Provides financial assistance, online support groups, medication/co-pay assistance.  1-800-813-HOPE 559 610 1714) ? [place] Assists  [place] cancer patients and their families through emotional , educational and financial support.  505-304-4412 ? [place] DSS Where to apply for food stamps, Medicaid and utility assistance. 614-042-2112 ? RCATS: Transportation to medical appointments. 551-347-3140 ? Social Security Administration: May apply for disability if have a Stage IV cancer. (919) 517-0917 3234796138 ? [employer], Disability and Transit Services: Assists with nutrition, care and transit needs. [phone]

## 2017-06-04 NOTE — Progress Notes (Signed)
Maevis Mumby Cormany presents today for injection per MD orders. Velcade 1.75 mg administered SQ in left Abdomen. Administration without incident. Patient tolerated well.   Kalea Perine Bodnar presents today for injection per MD orders. B12 1,057mcg administered IM in left Upper Arm. Administration without incident. Patient tolerated well.  Will get her Aranesp injection tomorrow.   Treatment given per orders. Patient tolerated it well without problems. Vitals stable and discharged home from clinic ambulatory. Follow up as scheduled.

## 2017-06-05 ENCOUNTER — Encounter (HOSPITAL_COMMUNITY): Payer: Self-pay

## 2017-06-05 ENCOUNTER — Encounter (HOSPITAL_BASED_OUTPATIENT_CLINIC_OR_DEPARTMENT_OTHER): Payer: Medicare Other

## 2017-06-05 ENCOUNTER — Other Ambulatory Visit: Payer: Self-pay

## 2017-06-05 ENCOUNTER — Encounter (HOSPITAL_COMMUNITY): Payer: Self-pay | Admitting: *Deleted

## 2017-06-05 ENCOUNTER — Other Ambulatory Visit (HOSPITAL_COMMUNITY): Payer: Self-pay | Admitting: *Deleted

## 2017-06-05 VITALS — BP 137/45 | HR 66 | Temp 97.5°F | Resp 18

## 2017-06-05 DIAGNOSIS — N183 Chronic kidney disease, stage 3 unspecified: Secondary | ICD-10-CM

## 2017-06-05 DIAGNOSIS — D631 Anemia in chronic kidney disease: Secondary | ICD-10-CM

## 2017-06-05 DIAGNOSIS — E538 Deficiency of other specified B group vitamins: Secondary | ICD-10-CM

## 2017-06-05 DIAGNOSIS — C9000 Multiple myeloma not having achieved remission: Secondary | ICD-10-CM

## 2017-06-05 DIAGNOSIS — D801 Nonfamilial hypogammaglobulinemia: Secondary | ICD-10-CM

## 2017-06-05 LAB — MULTIPLE MYELOMA PANEL, SERUM
ALBUMIN SERPL ELPH-MCNC: 3.4 g/dL (ref 2.9–4.4)
ALPHA 1: 0.3 g/dL (ref 0.0–0.4)
ALPHA2 GLOB SERPL ELPH-MCNC: 1 g/dL (ref 0.4–1.0)
Albumin/Glob SerPl: 1 (ref 0.7–1.7)
B-GLOBULIN SERPL ELPH-MCNC: 0.9 g/dL (ref 0.7–1.3)
Gamma Glob SerPl Elph-Mcnc: 1.3 g/dL (ref 0.4–1.8)
Globulin, Total: 3.5 g/dL (ref 2.2–3.9)
IGM (IMMUNOGLOBULIN M), SRM: 158 mg/dL (ref 26–217)
IgA: 170 mg/dL (ref 64–422)
IgG (Immunoglobin G), Serum: 1279 mg/dL (ref 700–1600)
M PROTEIN SERPL ELPH-MCNC: 0.4 g/dL — AB
Total Protein ELP: 6.9 g/dL (ref 6.0–8.5)

## 2017-06-05 LAB — KAPPA/LAMBDA LIGHT CHAINS
KAPPA FREE LGHT CHN: 73.7 mg/L — AB (ref 3.3–19.4)
KAPPA, LAMDA LIGHT CHAIN RATIO: 2.82 — AB (ref 0.26–1.65)
LAMDA FREE LIGHT CHAINS: 26.1 mg/L (ref 5.7–26.3)

## 2017-06-05 LAB — BETA 2 MICROGLOBULIN, SERUM: BETA 2 MICROGLOBULIN: 3.6 mg/L — AB (ref 0.6–2.4)

## 2017-06-05 MED ORDER — DARBEPOETIN ALFA 300 MCG/0.6ML IJ SOSY
300.0000 ug | PREFILLED_SYRINGE | Freq: Once | INTRAMUSCULAR | Status: AC
  Administered 2017-06-05: 300 ug via SUBCUTANEOUS

## 2017-06-05 MED ORDER — DARBEPOETIN ALFA 300 MCG/0.6ML IJ SOSY
PREFILLED_SYRINGE | INTRAMUSCULAR | Status: AC
  Filled 2017-06-05: qty 0.6

## 2017-06-05 NOTE — Progress Notes (Signed)
Carol Bradley presents today for injection per MD orders. Aranesp 300 mcg administered SQ in right lower abdomen. Administration without incident. Patient tolerated well. Patient tolerated treatment without incidence. Patient discharged ambulatory and in stable condition from clinic. Patient to follow up as scheduled.

## 2017-06-05 NOTE — Patient Instructions (Signed)
[place] at [facility] Discharge Instructions  RECOMMENDATIONS MADE BY THE CONSULTANT AND ANY TEST RESULTS WILL BE SENT TO YOUR REFERRING PHYSICIAN.  You had your Aranesp injection today, your hemoglobin was 10.4.   You will have your B-12 levels drawn next time and we will see if you need to continue taking them As you have requested, if you do still need the B-12 injections, we will send a prescription to you pharmacy. You will get your Velcade and aranesp injection on the same date moving forward, this will eliminate you coming to the clinic multiple consecutive days.    Thank you for choosing [place] at [facility] to provide your oncology and hematology care.  To afford each patient quality time with our provider, please arrive at least 15 minutes before your scheduled appointment time.    If you have a lab appointment with the [facility] please come in thru the  Main Entrance and check in at the main information desk  You need to re-schedule your appointment should you arrive 10 or more minutes late.  We strive to give you quality time with our providers, and arriving late affects you and other patients whose appointments are after yours.  Also, if you no show three or more times for appointments you may be dismissed from the clinic at the providers discretion.     Again, thank you for choosing [facility].  Our hope is that these requests will decrease the amount of time that you wait before being seen by our physicians.       _____________________________________________________________  Should you have questions after your visit to [facility], please contact our office at [phone] between the hours of 8:30 a.m. and 4:30 p.m.  Voicemails left after 4:30 p.m. will not be returned until the following business day.  For prescription refill requests, have your pharmacy contact our office.        Resources For Cancer Patients and their Caregivers ? American Cancer Society: Can assist with transportation, wigs, general needs, runs Look Good Feel Better.        8195737169 ? Cancer Care: Provides financial assistance, online support groups, medication/co-pay assistance.  1-800-813-HOPE (939)153-0864) ? [place] Assists [place] cancer patients and their families through emotional , educational and financial support.  (929)869-2641 ? [place] DSS Where to apply for food stamps, Medicaid and utility assistance. 908-697-5752 ? RCATS: Transportation to medical appointments. (681)629-5980 ? Social Security Administration: May apply for disability if have a Stage IV cancer. 303 128 3938 541-539-5918 ? [employer], Disability and Transit Services: Assists with nutrition, care and transit needs. [place] Support Programs: @10RELATIVEDAYS @ > Cancer Support Group  2nd Tuesday of the month 1pm-2pm, Journey Room  > Creative Journey  3rd Tuesday of the month 1130am-1pm, Journey Room  > Look Good Feel Better  1st Wednesday of the month 10am-12 noon, Journey Room (Call [place] to register [phone])

## 2017-06-05 NOTE — Progress Notes (Signed)
Patient requesting to get her Velcade and Aranesp injections on the same day and to get her B-12 injections at the pharmacy, that way she doesn't have to come over here twice.  I told her that wouldn't be a problem, and I explained to her that we were drawing a B-12 level on next visit to check if she needed to continue it.  She agrees and states if she does need it, she wants to start getting it at the pharmacy. Spoke with Mike Craze, NP who states this is a reasonable request and patient will need teaching on injections if she wants to give them to herself at home.

## 2017-06-18 ENCOUNTER — Other Ambulatory Visit (HOSPITAL_COMMUNITY): Payer: Medicare Other

## 2017-06-18 ENCOUNTER — Ambulatory Visit (HOSPITAL_COMMUNITY): Payer: Medicare Other

## 2017-06-19 ENCOUNTER — Ambulatory Visit (HOSPITAL_COMMUNITY): Payer: Medicare Other

## 2017-06-19 ENCOUNTER — Other Ambulatory Visit (HOSPITAL_COMMUNITY): Payer: Medicare Other

## 2017-06-22 ENCOUNTER — Other Ambulatory Visit (HOSPITAL_COMMUNITY): Payer: Self-pay | Admitting: *Deleted

## 2017-06-22 DIAGNOSIS — N183 Chronic kidney disease, stage 3 unspecified: Secondary | ICD-10-CM

## 2017-06-22 DIAGNOSIS — D631 Anemia in chronic kidney disease: Secondary | ICD-10-CM

## 2017-06-25 ENCOUNTER — Encounter (HOSPITAL_COMMUNITY): Payer: Self-pay

## 2017-06-25 ENCOUNTER — Encounter (HOSPITAL_BASED_OUTPATIENT_CLINIC_OR_DEPARTMENT_OTHER): Payer: Medicare Other

## 2017-06-25 ENCOUNTER — Encounter (HOSPITAL_COMMUNITY): Payer: Medicare Other | Attending: Oncology

## 2017-06-25 ENCOUNTER — Other Ambulatory Visit: Payer: Self-pay

## 2017-06-25 VITALS — BP 121/55 | HR 65 | Temp 98.0°F | Resp 18

## 2017-06-25 DIAGNOSIS — Z5112 Encounter for antineoplastic immunotherapy: Secondary | ICD-10-CM

## 2017-06-25 DIAGNOSIS — C9001 Multiple myeloma in remission: Secondary | ICD-10-CM | POA: Insufficient documentation

## 2017-06-25 DIAGNOSIS — D631 Anemia in chronic kidney disease: Secondary | ICD-10-CM

## 2017-06-25 DIAGNOSIS — E538 Deficiency of other specified B group vitamins: Secondary | ICD-10-CM | POA: Diagnosis not present

## 2017-06-25 DIAGNOSIS — Z9221 Personal history of antineoplastic chemotherapy: Secondary | ICD-10-CM | POA: Diagnosis not present

## 2017-06-25 DIAGNOSIS — I129 Hypertensive chronic kidney disease with stage 1 through stage 4 chronic kidney disease, or unspecified chronic kidney disease: Secondary | ICD-10-CM | POA: Diagnosis not present

## 2017-06-25 DIAGNOSIS — Z955 Presence of coronary angioplasty implant and graft: Secondary | ICD-10-CM | POA: Diagnosis not present

## 2017-06-25 DIAGNOSIS — N183 Chronic kidney disease, stage 3 unspecified: Secondary | ICD-10-CM

## 2017-06-25 DIAGNOSIS — I2510 Atherosclerotic heart disease of native coronary artery without angina pectoris: Secondary | ICD-10-CM | POA: Insufficient documentation

## 2017-06-25 DIAGNOSIS — D801 Nonfamilial hypogammaglobulinemia: Secondary | ICD-10-CM | POA: Insufficient documentation

## 2017-06-25 DIAGNOSIS — Z87891 Personal history of nicotine dependence: Secondary | ICD-10-CM | POA: Diagnosis not present

## 2017-06-25 DIAGNOSIS — E785 Hyperlipidemia, unspecified: Secondary | ICD-10-CM | POA: Diagnosis not present

## 2017-06-25 DIAGNOSIS — C9000 Multiple myeloma not having achieved remission: Secondary | ICD-10-CM

## 2017-06-25 DIAGNOSIS — J449 Chronic obstructive pulmonary disease, unspecified: Secondary | ICD-10-CM | POA: Diagnosis not present

## 2017-06-25 LAB — CBC WITH DIFFERENTIAL/PLATELET
BASOS ABS: 0 10*3/uL (ref 0.0–0.1)
BASOS PCT: 0 %
EOS PCT: 0 %
Eosinophils Absolute: 0 10*3/uL (ref 0.0–0.7)
HCT: 35.2 % — ABNORMAL LOW (ref 36.0–46.0)
Hemoglobin: 10.6 g/dL — ABNORMAL LOW (ref 12.0–15.0)
LYMPHS PCT: 14 %
Lymphs Abs: 1.3 10*3/uL (ref 0.7–4.0)
MCH: 24.2 pg — ABNORMAL LOW (ref 26.0–34.0)
MCHC: 30.1 g/dL (ref 30.0–36.0)
MCV: 80.4 fL (ref 78.0–100.0)
MONO ABS: 0.9 10*3/uL (ref 0.1–1.0)
Monocytes Relative: 10 %
NEUTROS ABS: 6.9 10*3/uL (ref 1.7–7.7)
Neutrophils Relative %: 76 %
PLATELETS: 265 10*3/uL (ref 150–400)
RBC: 4.38 MIL/uL (ref 3.87–5.11)
RDW: 16.3 % — AB (ref 11.5–15.5)
WBC: 9.2 10*3/uL (ref 4.0–10.5)

## 2017-06-25 LAB — BASIC METABOLIC PANEL
ANION GAP: 8 (ref 5–15)
BUN: 23 mg/dL — AB (ref 6–20)
CALCIUM: 9 mg/dL (ref 8.9–10.3)
CO2: 26 mmol/L (ref 22–32)
Chloride: 102 mmol/L (ref 101–111)
Creatinine, Ser: 1.17 mg/dL — ABNORMAL HIGH (ref 0.44–1.00)
GFR calc Af Amer: 51 mL/min — ABNORMAL LOW (ref >60)
GFR, EST NON AFRICAN AMERICAN: 44 mL/min — AB (ref >60)
Glucose, Bld: 107 mg/dL — ABNORMAL HIGH (ref 65–99)
POTASSIUM: 4 mmol/L (ref 3.5–5.1)
SODIUM: 136 mmol/L (ref 135–145)

## 2017-06-25 MED ORDER — PROCHLORPERAZINE MALEATE 10 MG PO TABS
10.0000 mg | ORAL_TABLET | Freq: Once | ORAL | Status: DC
  Filled 2017-06-25: qty 1

## 2017-06-25 MED ORDER — BORTEZOMIB CHEMO SQ INJECTION 3.5 MG (2.5MG/ML)
1.0000 mg/m2 | Freq: Once | INTRAMUSCULAR | Status: AC
  Administered 2017-06-25: 1.75 mg via SUBCUTANEOUS
  Filled 2017-06-25: qty 1.75

## 2017-06-25 MED ORDER — DARBEPOETIN ALFA 300 MCG/0.6ML IJ SOSY
300.0000 ug | PREFILLED_SYRINGE | Freq: Once | INTRAMUSCULAR | Status: AC
  Administered 2017-06-25: 300 ug via SUBCUTANEOUS
  Filled 2017-06-25: qty 0.6

## 2017-06-25 NOTE — Patient Instructions (Signed)
[place] at [facility] Discharge Instructions  RECOMMENDATIONS MADE BY THE CONSULTANT AND ANY TEST RESULTS WILL BE SENT TO YOUR REFERRING PHYSICIAN.  You had your Velcade and your Aranesp injection today.   Thank you for choosing [place] at [facility] to provide your oncology and hematology care.  To afford each patient quality time with our provider, please arrive at least 15 minutes before your scheduled appointment time.    If you have a lab appointment with the [facility] please come in thru the  Main Entrance and check in at the main information desk  You need to re-schedule your appointment should you arrive 10 or more minutes late.  We strive to give you quality time with our providers, and arriving late affects you and other patients whose appointments are after yours.  Also, if you no show three or more times for appointments you may be dismissed from the clinic at the providers discretion.     Again, thank you for choosing [facility].  Our hope is that these requests will decrease the amount of time that you wait before being seen by our physicians.       _____________________________________________________________  Should you have questions after your visit to [facility], please contact our office at [phone] between the hours of 8:30 a.m. and 4:30 p.m.  Voicemails left after 4:30 p.m. will not be returned until the following business day.  For prescription refill requests, have your pharmacy contact our office.       Resources For Cancer Patients and their Caregivers ? American Cancer Society: Can assist with transportation, wigs, general needs, runs Look Good Feel Better.        3216885560 ? Cancer Care: Provides financial assistance, online support groups, medication/co-pay assistance.  1-800-813-HOPE (838) 747-7875) ? [place] Assists [place] cancer  patients and their families through emotional , educational and financial support.  (270)470-6247 ? [place] DSS Where to apply for food stamps, Medicaid and utility assistance. 306-568-1048 ? RC[employer]: Transportation to medical appointments. 310-724-9613 ? Social Security Administration: May apply for disability if have a Stage IV cancer. 915-643-4683 605-665-1592 ? [employer], Disability and Transit Services: Assists with nutrition, care and transit needs. [place] Support Programs: @10RELATIVEDAYS @ > Cancer Support Group  2nd Tuesday of the month 1pm-2pm, Journey Room  > Creative Journey  3rd Tuesday of the month 1130am-1pm, Journey Room  > Look Good Feel Better  1st Wednesday of the month 10am-12 noon, Journey Room (Call [place] to register [phone])

## 2017-06-25 NOTE — Progress Notes (Signed)
Carol Bradley presents today for injection per MD orders. Aranesp 300 mcg administered SQ in right lower abdomen. Administration without incident. Patient tolerated well.  Carol Bradley presents today for injection per MD orders. Velcade 1.75 mg administered SQ in left lower abdomen. Administration without incident. Patient tolerated well.  Patient discharged ambulatory and in stable condition to self. Patient to follow up as scheduled.

## 2017-07-04 ENCOUNTER — Ambulatory Visit (HOSPITAL_COMMUNITY): Payer: Medicare Other | Admitting: Adult Health

## 2017-07-04 ENCOUNTER — Other Ambulatory Visit (HOSPITAL_COMMUNITY): Payer: Medicare Other

## 2017-07-04 ENCOUNTER — Ambulatory Visit (HOSPITAL_COMMUNITY): Payer: Medicare Other

## 2017-07-05 ENCOUNTER — Ambulatory Visit (HOSPITAL_COMMUNITY): Payer: Medicare Other

## 2017-07-08 NOTE — Progress Notes (Addendum)
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  INTERVAL HISTORY: Carol Bradley 76 y.o. female returns for followup of IgG kappa Myeloma, in remission with intolerance to Revlimid/Dexamethasone after taking for many months and declining QOL. AND Hypogammaglobulinemia, on IVIG monthly. AND Anemia in chronic renal disease, Stage III.  On ESA therapy, Aranesp, every 2 weeks.    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       03/18/2015 - 07/28/2015 Chemotherapy    Velcade 1.6 mg/m2 discontinued secondary to intolerance      07/28/2015 Adverse Reaction    stool incontinence, weakness, collapse upon standing, felt to be secondary to velcade      11/09/2015 - 12/13/2015 Chemotherapy    cytoxan IV 300 mg/m2 administered X 2 doses only in addition to rev/dex      11/09/2015 - 06/08/2016 Chemotherapy    Revlimid/Dexamethasone       02/05/2017 -  Chemotherapy    The patient had bortezomib SQ (VELCADE) chemo injection 1.75 mg, 1 mg/m2 = 1.75 mg (66.7 % of original dose 1.5 mg/m2), Subcutaneous,  Once, 1 of 8 cycles Dose modification: 1.5 mg/m2 (original dose 1.5 mg/m2, Cycle 1, Reason: Provider Judgment, Comment: Per Dr. Norma Fredrickson recommendations from [facility] 1 mg/m2 (original dose 1.5 mg/m2, Cycle 1, Reason: Provider Judgment)  for chemotherapy treatment.        She presents today for continued follow-up.  She states that she has been feeling well and significantly better over the course of the past week.  She continues to have occasional fatigue but denies any other  complaints.  She states she knows when her hemoglobin is low based on her energy levels, shortness of breath and dizziness.  Her appetite and energy levels have improved and have been 75%.  She does complain of new onset left elbow and right knee pain that began 1 month ago.  She denies injury and states the pain is intermittent.  She has not tried any medications or therapies for these pains.  Otherwise she has no complaints today. She denied any nausea, vomiting, chest pain, shortness of breath, abdominal pain, constipation, focal weakness.  She denies any recent infections.  She met with Dr. Norma Fredrickson on 03/22/2017 for treatment planning of her multiple myeloma.  It was decided that she was not a candidate for stem cell replacement due to poor marrow reserve.  It was recommended that patient not receive Procrit unless hemoglobin is less than 10 and he talked about health promotion with aerobic exercise.  She was to continue therapy with [place] for her q 3 month Zometa and biweekly Velcade for multiple myeloma and biweekly Aranesp for her anemia.  She was seen by Dr. Talbert Cage on 05/07/2017.  She noted that her anemia had not been responding appropriately to the ESA therapy, Aranesp so she adjusted her dose to Aranesp 300 mcg q. 14 days since she continued to complain of symptoms of anemia.  She continued maintenace Velcade every 15 days.  We were to continue to monitor myeloma, immunoglobulins, B12 levels and anemia labs routinely.  Review of Systems  Constitutional: Positive for malaise/fatigue. Negative for chills and fever.  HENT: Negative.  Negative for hearing loss, sore throat and tinnitus.   Eyes: Negative.  Negative for blurred vision, photophobia and discharge.  Respiratory: Negative.  Negative for cough, hemoptysis, shortness of breath and wheezing.   Cardiovascular: Negative for chest pain, palpitations, orthopnea, claudication and leg swelling.  Gastrointestinal: Negative.  Negative for abdominal  pain, constipation, diarrhea, melena, nausea and vomiting.  Genitourinary: Negative.  Negative for dysuria and hematuria.  Musculoskeletal: Positive for joint pain. Negative for back pain and myalgias.  Skin: Negative.  Negative for itching and rash.  Neurological: Negative.  Negative for dizziness, weakness and headaches.  Endo/Heme/Allergies: Negative for environmental allergies and polydipsia. Does not bruise/bleed easily.  Psychiatric/Behavioral: Negative.  Negative for depression. The patient is not nervous/anxious and does not have insomnia.     Past Medical History:  Diagnosis Date  . 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 CAD (coronary artery disease)   . COPD (chronic obstructive pulmonary disease) ([place])   . History of tobacco abuse   . Hyperlipidemia   . Hypertension   . Hypogammaglobulinemia ([place]) 01/15/2016  . Multiple myeloma ([place])   . Vitamin B12 deficiency 04/15/2016   Overview:  Vitamin B12 level documented 155, January 2016 with the normal range being 211-924    Past Surgical History:  Procedure Laterality Date  . BACK SURGERY    . CARDIAC CATHETERIZATION  04/2011   right and left cath showing normal right heart pressures,but newly diagnosed coronary artery disease/drug eluting stent placed to RCA with residual disease in the proximal RCA and LAD and ramus, normal LV function and 60-65% EF  . COLONOSCOPY N/A 09/30/2014   Procedure: COLONOSCOPY;  Surgeon: Rogene Houston, MD;  Location: [facility];  Service: Endoscopy;  Laterality: N/A;  225  . CORONARY ANGIOPLASTY WITH STENT PLACEMENT  04/2011   mid RCA: 3.0 X38 mm Promus DES. Residual 40% disease proximally  . NECK SURGERY      Family History  Problem Relation Age of Onset  . Heart failure Mother   . Cancer Father     Social History   Socioeconomic History  . Marital status: Widowed    Spouse name: None  . Number of children: None  . Years of education: None  . Highest  education level: None  Social Needs  . Financial resource strain: None  . Food insecurity - worry: None  . Food insecurity - inability: None  . Transportation needs - medical: None  . Transportation needs - non-medical: None  Occupational History  . 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 Smoking status: Former Smoker    Packs/day: 3.00    Years: 25.00    Pack years: 75.00    Types: Cigarettes    Last attempt to quit: 07/10/1994    Years since quitting: 23.0  . Smokeless tobacco: Never Used  Substance and Sexual Activity  . Alcohol use: No  . Drug use: None  . Sexual activity: None  Other Topics Concern  . None  Social History Narrative  . None     PHYSICAL EXAMINATION  ECOG PERFORMANCE STATUS: 1 - Symptomatic but completely ambulatory   Physical Exam  Constitutional: She is oriented to person, place, and time and well-developed, well-nourished, and in no distress. Vital signs are normal. No distress.  HENT:  Head: Normocephalic and atraumatic.  Mouth/Throat: No  oropharyngeal exudate.  Eyes: Conjunctivae and EOM are normal. Pupils are equal, round, and reactive to light. No scleral icterus.  Neck: Normal range of motion. Neck supple. No JVD present.  Cardiovascular: Normal rate, regular rhythm and normal heart sounds. Exam reveals no gallop and no friction rub.  No murmur heard. Pulmonary/Chest: Effort normal and breath sounds normal. No respiratory distress. She has no wheezes. She has no rales.  Abdominal: Soft. Bowel sounds are normal. She exhibits no distension. There is no tenderness. There is no guarding.  Musculoskeletal: Normal range of motion. She exhibits tenderness (right knee and left elbow). She exhibits no edema or deformity.  Lymphadenopathy:    She has no cervical adenopathy.  Neurological: She is alert and oriented to person, place, and time. No cranial nerve deficit. Gait normal.  Skin: Skin is warm and dry. No rash noted.  No erythema. No pallor.  Psychiatric: Affect and judgment normal.  Nursing note and vitals reviewed.   LABORATORY DATA: CBC    Component Value Date/Time   WBC 8.2 07/09/2017 0837   RBC 4.25 07/09/2017 0837   HGB 10.3 (L) 07/09/2017 0837   HCT 34.4 (L) 07/09/2017 0837   PLT 296 07/09/2017 0837   MCV 80.9 07/09/2017 0837   MCH 24.2 (L) 07/09/2017 0837   MCHC 29.9 (L) 07/09/2017 0837   RDW 17.5 (H) 07/09/2017 0837   LYMPHSABS 0.9 07/09/2017 0837   MONOABS 1.2 (H) 07/09/2017 0837   EOSABS 0.2 07/09/2017 0837   BASOSABS 0.0 07/09/2017 0837      Chemistry      Component Value Date/Time   NA 136 07/09/2017 0835   K 4.3 07/09/2017 0835   CL 100 (L) 07/09/2017 0835   CO2 26 07/09/2017 0835   BUN 23 (H) 07/09/2017 0835   CREATININE 1.37 (H) 07/09/2017 0835      Component Value Date/Time   CALCIUM 9.0 07/09/2017 0835   ALKPHOS 83 07/09/2017 0835   AST 20 07/09/2017 0835   ALT 12 (L) 07/09/2017 0835   BILITOT 0.3 07/09/2017 0835      Lab Results  Component Value Date   PROT 7.2 07/09/2017   ALBUMINELP 3.3 12/11/2016   A1GS 0.3 12/11/2016   A2GS 1.0 12/11/2016   BETS 1.0 12/11/2016   GAMS 1.4 12/11/2016   MSPIKE Not Observed 12/11/2016   SPEI Comment 12/11/2016   SPECOM Comment 12/11/2016   IGGSERUM 1,279 06/04/2017   IGA 170 06/04/2017   IGMSERUM 158 06/04/2017   KPAFRELGTCHN 73.7 (H) 06/04/2017   LAMBDASER 26.1 06/04/2017   KAPLAMBRATIO 2.82 (H) 06/04/2017     ASSESSMENT AND PLAN:  Multiple myeloma ([place]) in remission IgG kappa Myeloma, in remission with intolerance to Revlimid/Dexamethasone after taking for many months.   Continue maintenance Velcade 1 mg/m every 15 days.  Continue Zometa to every 3 months. Continue to monitor her lab work on a routine basis for relapse of her myeloma. Repeat myeloma labs in 4 weeks.  She has follow-up with Dr. Norma Fredrickson next month.  Hypogammaglobulinemia (HCC) Hypogammamglobulinemia, on IVIG monthly. Immunoglobulin  levels have been good. Per patient, she was told by Dr. Norma Fredrickson that she no longer needed IVIG replacement at this time.  Continue to monitor immunoglobulins.  Vitamin B12 deficiency Patient would like to receive B12 injections at her pharmacy if possible.  B12 levels pending during dictation. Continue with maintenance B12 [place] monthly.   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   On ESA  therapy, Aranesp.  Hemoglobin 10.3 today.  The Increased dose of Aranesp from 60 mcg monthly to 300 mg q. 14 days seems to be helping with her symptoms of anemia.  Iron studies pending at this time.  New onset joint pain: Left elbow and right knee Offered imaging of both but patient declined at this time.  Patient wishes to monitor and to be reevaluated in 1 month given its only been a couple weeks since the pain  began.  Educated the patient that Zometa can cause joint pain.  No obvious deformity of left elbow or right knee.  No redness or signs of inflammation.  Most recent PET from August 2016 showed no evidence of metabolic activity in the neck chest abdomen pelvis or extremities.  If this persists it may warrant further workup.  Will have patient come back in 1 month.  THERAPY PLAN:  Today patient received Velcade and Aranesp. Treatment plan states to hold Aranesp for Hemoglobin greater then 11. Will treat. Hemoglobin 10.3. She will return in 2 weeks for Zometa, Velcade and Aranesp.  Patient would like to either discontinue her B12 injections or receive them at her pharmacy. Will arrange for B12 at pharmacy given by RN.  Patient did not receive B12 today. She will get at pharmacy.   Aranesp every 2 weeks, Zometa 3 mg q47month B12 injection monthly, and velcade q15 days.  RTC in 1 month for follow up with labs.  No orders of the defined types were placed in this encounter.  All questions were answered. The patient knows to call the clinic with any  problems, questions or concerns. We can certainly see the patient much sooner if necessary.   This note is electronically signed by: JJacquelin Hawking NP 07/09/2017 6:11 PM

## 2017-07-09 ENCOUNTER — Encounter (HOSPITAL_BASED_OUTPATIENT_CLINIC_OR_DEPARTMENT_OTHER): Payer: Medicare Other

## 2017-07-09 ENCOUNTER — Encounter (HOSPITAL_COMMUNITY): Payer: Medicare Other

## 2017-07-09 ENCOUNTER — Encounter: Payer: Self-pay | Admitting: Oncology

## 2017-07-09 ENCOUNTER — Encounter (HOSPITAL_COMMUNITY): Payer: Self-pay | Admitting: Oncology

## 2017-07-09 ENCOUNTER — Encounter (HOSPITAL_BASED_OUTPATIENT_CLINIC_OR_DEPARTMENT_OTHER): Payer: Medicare Other | Admitting: Oncology

## 2017-07-09 VITALS — BP 126/64 | HR 62 | Temp 97.8°F | Resp 16 | Wt 146.3 lb

## 2017-07-09 DIAGNOSIS — C9000 Multiple myeloma not having achieved remission: Secondary | ICD-10-CM

## 2017-07-09 DIAGNOSIS — E538 Deficiency of other specified B group vitamins: Secondary | ICD-10-CM

## 2017-07-09 DIAGNOSIS — N183 Chronic kidney disease, stage 3 unspecified: Secondary | ICD-10-CM

## 2017-07-09 DIAGNOSIS — C9001 Multiple myeloma in remission: Secondary | ICD-10-CM

## 2017-07-09 DIAGNOSIS — D801 Nonfamilial hypogammaglobulinemia: Secondary | ICD-10-CM | POA: Diagnosis not present

## 2017-07-09 DIAGNOSIS — D631 Anemia in chronic kidney disease: Secondary | ICD-10-CM

## 2017-07-09 DIAGNOSIS — Z5112 Encounter for antineoplastic immunotherapy: Secondary | ICD-10-CM

## 2017-07-09 LAB — CBC WITH DIFFERENTIAL/PLATELET
Basophils Absolute: 0 10*3/uL (ref 0.0–0.1)
Basophils Relative: 0 %
EOS ABS: 0.2 10*3/uL (ref 0.0–0.7)
EOS PCT: 2 %
HCT: 34.4 % — ABNORMAL LOW (ref 36.0–46.0)
Hemoglobin: 10.3 g/dL — ABNORMAL LOW (ref 12.0–15.0)
LYMPHS ABS: 0.9 10*3/uL (ref 0.7–4.0)
LYMPHS PCT: 11 %
MCH: 24.2 pg — AB (ref 26.0–34.0)
MCHC: 29.9 g/dL — AB (ref 30.0–36.0)
MCV: 80.9 fL (ref 78.0–100.0)
MONO ABS: 1.2 10*3/uL — AB (ref 0.1–1.0)
MONOS PCT: 15 %
Neutro Abs: 5.9 10*3/uL (ref 1.7–7.7)
Neutrophils Relative %: 72 %
PLATELETS: 296 10*3/uL (ref 150–400)
RBC: 4.25 MIL/uL (ref 3.87–5.11)
RDW: 17.5 % — AB (ref 11.5–15.5)
WBC: 8.2 10*3/uL (ref 4.0–10.5)

## 2017-07-09 LAB — IRON AND TIBC
IRON: 26 ug/dL — AB (ref 28–170)
SATURATION RATIOS: 6 % — AB (ref 10.4–31.8)
TIBC: 412 ug/dL (ref 250–450)
UIBC: 386 ug/dL

## 2017-07-09 LAB — COMPREHENSIVE METABOLIC PANEL
ALBUMIN: 3.3 g/dL — AB (ref 3.5–5.0)
ALK PHOS: 83 U/L (ref 38–126)
ALT: 12 U/L — ABNORMAL LOW (ref 14–54)
ANION GAP: 10 (ref 5–15)
AST: 20 U/L (ref 15–41)
BILIRUBIN TOTAL: 0.3 mg/dL (ref 0.3–1.2)
BUN: 23 mg/dL — ABNORMAL HIGH (ref 6–20)
CALCIUM: 9 mg/dL (ref 8.9–10.3)
CO2: 26 mmol/L (ref 22–32)
Chloride: 100 mmol/L — ABNORMAL LOW (ref 101–111)
Creatinine, Ser: 1.37 mg/dL — ABNORMAL HIGH (ref 0.44–1.00)
GFR calc non Af Amer: 36 mL/min — ABNORMAL LOW (ref >60)
GFR, EST AFRICAN AMERICAN: 42 mL/min — AB (ref >60)
Glucose, Bld: 91 mg/dL (ref 65–99)
POTASSIUM: 4.3 mmol/L (ref 3.5–5.1)
SODIUM: 136 mmol/L (ref 135–145)
TOTAL PROTEIN: 7.2 g/dL (ref 6.5–8.1)

## 2017-07-09 LAB — FERRITIN: Ferritin: 10 ng/mL — ABNORMAL LOW (ref 11–307)

## 2017-07-09 LAB — VITAMIN B12: Vitamin B-12: 410 pg/mL (ref 180–914)

## 2017-07-09 MED ORDER — CYANOCOBALAMIN 1000 MCG/ML IJ SOLN: 1000.0000 ug | INTRAMUSCULAR | Status: AC

## 2017-07-09 MED ORDER — BORTEZOMIB CHEMO SQ INJECTION 3.5 MG (2.5MG/ML)
1.0000 mg/m2 | Freq: Once | INTRAMUSCULAR | Status: AC
  Administered 2017-07-09: 1.75 mg via SUBCUTANEOUS
  Filled 2017-07-09: qty 1.75

## 2017-07-09 MED ORDER — PROCHLORPERAZINE MALEATE 10 MG PO TABS: 10.0000 mg | ORAL_TABLET | Freq: Once | ORAL | Status: DC

## 2017-07-09 MED ORDER — DARBEPOETIN ALFA 300 MCG/0.6ML IJ SOSY
300.0000 ug | PREFILLED_SYRINGE | Freq: Once | INTRAMUSCULAR | Status: AC
  Administered 2017-07-09: 300 ug via SUBCUTANEOUS
  Filled 2017-07-09: qty 0.6

## 2017-07-09 NOTE — Patient Instructions (Signed)
[facility] Discharge Instructions for Patients Receiving Chemotherapy   Beginning January 23rd 2017 lab work for the [facility] will be done in the  Main lab at [facility] on 1st floor. If you have a lab appointment with the [place] please come in thru the  Main Entrance and check in at the main information desk  Aranesp and Velcade given today Follow up as scheduled   Today you received the following chemotherapy agents   To help prevent nausea and vomiting after your treatment, we encourage you to take your nausea medication    If you develop nausea and vomiting, or diarrhea that is not controlled by your medication, call the clinic.  The clinic phone number is [phone]. Office hours are Monday-Friday 8:30am-5:00pm.  BELOW ARE SYMPTOMS THAT SHOULD BE REPORTED IMMEDIATELY:  *FEVER GREATER THAN 101.0 F  *CHILLS WITH OR WITHOUT FEVER  NAUSEA AND VOMITING THAT IS NOT CONTROLLED WITH YOUR NAUSEA MEDICATION  *UNUSUAL SHORTNESS OF BREATH  *UNUSUAL BRUISING OR BLEEDING  TENDERNESS IN MOUTH AND THROAT WITH OR WITHOUT PRESENCE OF ULCERS  *URINARY PROBLEMS  *BOWEL PROBLEMS  UNUSUAL RASH Items with * indicate a potential emergency and should be followed up as soon as possible. If you have an emergency after office hours please contact your primary care physician or go to the nearest emergency department.  Please call the clinic during office hours if you have any questions or concerns.   You may also contact the Patient Navigator at [phone] should you have any questions or need assistance in obtaining follow up care.      Resources For Cancer Patients and their Caregivers ? American Cancer Society: Can assist with transportation, wigs, general needs, runs Look Good Feel Better.        8635893878 ? Cancer Care: Provides financial assistance, online support groups, medication/co-pay assistance.  1-800-813-HOPE (212)841-0935) ? [place] Assists [place] cancer patients and their families through emotional , educational and financial support.  310-802-7340 ? [place] DSS Where to apply for food stamps, Medicaid and utility assistance. 4348103531 ? RCATS: Transportation to medical appointments. ([phone] 716-4876 ? Social Security Administration: May apply for disability if have a Stage IV cancer. 303-744-5780 680-404-8485 ? [employer], Disability and Transit Services: Assists with nutrition, care and transit needs. [phone]

## 2017-07-09 NOTE — Addendum Note (Signed)
Addended by: Faythe Casa E on: 07/09/2017 06:45 PM   Modules accepted: Orders

## 2017-07-09 NOTE — Progress Notes (Signed)
Aman Batley Remley presents today for injection per MD orders. Velcade 1.75 mg administered SQ in right Abdomen. Administration without incident. Patient tolerated well. Labs reviewed with MD. Proceed with therapy.   Teigan Manner Culton presents today for injection per MD orders. Aranesp 390mcg administered SQ in left Abdomen. Administration without incident. Patient tolerated well.  Treatment given per orders. Patient tolerated it well without problems. Vitals stable and discharged home from clinic ambulatory. Follow up as scheduled.

## 2017-07-10 ENCOUNTER — Other Ambulatory Visit (HOSPITAL_COMMUNITY): Payer: Self-pay | Admitting: Oncology

## 2017-07-10 DIAGNOSIS — D509 Iron deficiency anemia, unspecified: Secondary | ICD-10-CM | POA: Insufficient documentation

## 2017-07-11 MED ORDER — CYANOCOBALAMIN 1000 MCG/ML IJ SOLN: 1000.0000 ug | INTRAMUSCULAR | 0 refills | Status: DC

## 2017-07-11 NOTE — Addendum Note (Signed)
Addended by: Faythe Casa E on: 07/11/2017 05:01 PM   Modules accepted: Orders

## 2017-07-12 ENCOUNTER — Other Ambulatory Visit (HOSPITAL_COMMUNITY): Payer: Self-pay | Admitting: Oncology

## 2017-07-12 ENCOUNTER — Other Ambulatory Visit (HOSPITAL_COMMUNITY): Payer: Self-pay | Admitting: Adult Health

## 2017-07-12 DIAGNOSIS — E538 Deficiency of other specified B group vitamins: Secondary | ICD-10-CM

## 2017-07-12 DIAGNOSIS — C9001 Multiple myeloma in remission: Secondary | ICD-10-CM

## 2017-07-13 ENCOUNTER — Inpatient Hospital Stay (HOSPITAL_COMMUNITY): Payer: Medicare Other | Attending: Oncology

## 2017-07-13 ENCOUNTER — Encounter (HOSPITAL_COMMUNITY): Payer: Self-pay

## 2017-07-13 VITALS — BP 127/64 | HR 71 | Temp 98.2°F | Resp 20 | Wt 148.1 lb

## 2017-07-13 DIAGNOSIS — N183 Chronic kidney disease, stage 3 (moderate): Secondary | ICD-10-CM | POA: Insufficient documentation

## 2017-07-13 DIAGNOSIS — E538 Deficiency of other specified B group vitamins: Secondary | ICD-10-CM | POA: Diagnosis present

## 2017-07-13 DIAGNOSIS — D631 Anemia in chronic kidney disease: Secondary | ICD-10-CM | POA: Insufficient documentation

## 2017-07-13 DIAGNOSIS — C9001 Multiple myeloma in remission: Secondary | ICD-10-CM | POA: Insufficient documentation

## 2017-07-13 MED ORDER — CYANOCOBALAMIN 1000 MCG/ML IJ SOLN
INTRAMUSCULAR | Status: AC
  Filled 2017-07-13: qty 1

## 2017-07-13 MED ORDER — CYANOCOBALAMIN 1000 MCG/ML IJ SOLN
1000.0000 ug | Freq: Once | INTRAMUSCULAR | Status: AC
  Administered 2017-07-13: 1000 ug via INTRAMUSCULAR

## 2017-07-13 NOTE — Patient Instructions (Signed)
[facility] at [facility] Discharge Instructions  RECOMMENDATIONS MADE BY THE CONSULTANT AND ANY TEST RESULTS WILL BE SENT TO YOUR REFERRING PHYSICIAN.  Received Vit B12 injection today. Follow-up as scheduled. Call clinic for any questions or concerns  Thank you for choosing [facility] at [facility] to provide your oncology and hematology care.  To afford each patient quality time with our provider, please arrive at least 15 minutes before your scheduled appointment time.    If you have a lab appointment with the Cancer Center please come in thru the  Main Entrance and check in at the main information desk  You need to re-schedule your appointment should you arrive 10 or more minutes late.  We strive to give you quality time with our providers, and arriving late affects you and other patients whose appointments are after yours.  Also, if you no show three or more times for appointments you may be dismissed from the clinic at the providers discretion.     Again, thank you for choosing [facility].  Our hope is that these requests will decrease the amount of time that you wait before being seen by our physicians.       _____________________________________________________________  Should you have questions after your visit to  Cancer Center, please contact our office at [phone] between the hours of 8:30 a.m. and 4:30 p.m.  Voicemails left after 4:30 p.m. will not be returned until the following business day.  For prescription refill requests, have your pharmacy contact our office.       Resources For Cancer Patients and their Caregivers ? American Cancer Society: Can assist with transportation, wigs, general needs, runs Look Good Feel Better.        1-888-227-6333 ? Cancer Care: Provides financial assistance, online support groups, medication/co-pay assistance.  1-800-813-HOPE (4673) ? [facility] Assists [place] cancer patients and their families through emotional , educational and financial support.  336-427-4357 ? [place] DSS Where to apply for food stamps, Medicaid and utility assistance. 336-342-1394 ? RCATS: Transportation to medical appointments. 336-347-2287 ? Social Security Administration: May apply for disability if have a Stage IV cancer. 336-342-7796 1-800-772-1213 ? [place] Aging, Disability and Transit Services: Assists with nutrition, care and transit needs. 336-349-2343  Cancer Center Support Programs: @10RELATIVEDAYS@ > Cancer Support Group  2nd Tuesday of the month 1pm-2pm, Journey Room  > Creative Journey  3rd Tuesday of the month 1130am-1pm, Journey Room  > Look Good Feel Better  1st Wednesday of the month 10am-12 noon, Journey Room (Call American Cancer Society to register 1-800-395-5775)   

## 2017-07-13 NOTE — Progress Notes (Signed)
Carol Bradley tolerated Vit B12 injection well without complaints or incident. VSS Pt discharged self ambulatory in satisfactory condition accompanied by a friend

## 2017-07-16 ENCOUNTER — Inpatient Hospital Stay (HOSPITAL_COMMUNITY): Payer: Medicare Other

## 2017-07-16 ENCOUNTER — Encounter (HOSPITAL_COMMUNITY): Payer: Self-pay

## 2017-07-16 VITALS — BP 123/46 | HR 70 | Temp 97.6°F | Resp 18 | Wt 148.0 lb

## 2017-07-16 DIAGNOSIS — D631 Anemia in chronic kidney disease: Secondary | ICD-10-CM | POA: Diagnosis not present

## 2017-07-16 DIAGNOSIS — E538 Deficiency of other specified B group vitamins: Secondary | ICD-10-CM

## 2017-07-16 DIAGNOSIS — N183 Chronic kidney disease, stage 3 unspecified: Secondary | ICD-10-CM

## 2017-07-16 DIAGNOSIS — C9001 Multiple myeloma in remission: Secondary | ICD-10-CM | POA: Diagnosis not present

## 2017-07-16 DIAGNOSIS — D509 Iron deficiency anemia, unspecified: Secondary | ICD-10-CM

## 2017-07-16 MED ORDER — SODIUM CHLORIDE 0.9 % IV SOLN
510.0000 mg | Freq: Once | INTRAVENOUS | Status: AC
  Administered 2017-07-16: 510 mg via INTRAVENOUS
  Filled 2017-07-16: qty 17

## 2017-07-16 MED ORDER — SODIUM CHLORIDE 0.9 % IV SOLN
Freq: Once | INTRAVENOUS | Status: AC
  Administered 2017-07-16: 14:00:00 via INTRAVENOUS

## 2017-07-16 NOTE — Progress Notes (Signed)
Carol Bradley tolerated Feraheme infusion well without complaints or incident. VSS Pt discharged self ambulatory using cane in satisfactory condition

## 2017-07-16 NOTE — Patient Instructions (Signed)
[facility] at [facility] Discharge Instructions  RECOMMENDATIONS MADE BY THE CONSULTANT AND ANY TEST RESULTS WILL BE SENT TO YOUR REFERRING PHYSICIAN.  Received Feraheme infusion today.Follow-up as scheduled. Call clinic for any questions or concerns  Thank you for choosing [facility] at [facility] to provide your oncology and hematology care.  To afford each patient quality time with our provider, please arrive at least 15 minutes before your scheduled appointment time.    If you have a lab appointment with the Cancer Center please come in thru the  Main Entrance and check in at the main information desk  You need to re-schedule your appointment should you arrive 10 or more minutes late.  We strive to give you quality time with our providers, and arriving late affects you and other patients whose appointments are after yours.  Also, if you no show three or more times for appointments you may be dismissed from the clinic at the providers discretion.     Again, thank you for choosing [facility].  Our hope is that these requests will decrease the amount of time that you wait before being seen by our physicians.       _____________________________________________________________  Should you have questions after your visit to [facility], please contact our office at [phone] between the hours of 8:30 a.m. and 4:30 p.m.  Voicemails left after 4:30 p.m. will not be returned until the following business day.  For prescription refill requests, have your pharmacy contact our office.       Resources For Cancer Patients and their Caregivers ? American Cancer Society: Can assist with transportation, wigs, general needs, runs Look Good Feel Better.        1-888-227-6333 ? Cancer Care: Provides financial assistance, online support groups, medication/co-pay assistance.  1-800-813-HOPE (4673) ? [facility] Assists [place] cancer patients and their families through emotional , educational and financial support.  336-427-4357 ? [place] DSS Where to apply for food stamps, Medicaid and utility assistance. 336-342-1394 ? RCATS: Transportation to medical appointments. 336-347-2287 ? Social Security Administration: May apply for disability if have a Stage IV cancer. 336-342-7796 1-800-772-1213 ? [place] Aging, Disability and Transit Services: Assists with nutrition, care and transit needs. 336-349-2343  Cancer Center Support Programs: @10RELATIVEDAYS@ > Cancer Support Group  2nd Tuesday of the month 1pm-2pm, Journey Room  > Creative Journey  3rd Tuesday of the month 1130am-1pm, Journey Room  > Look Good Feel Better  1st Wednesday of the month 10am-12 noon, Journey Room (Call American Cancer Society to register 1-800-395-5775)   

## 2017-07-17 ENCOUNTER — Ambulatory Visit (HOSPITAL_COMMUNITY): Payer: Medicare Other

## 2017-07-23 ENCOUNTER — Ambulatory Visit (HOSPITAL_COMMUNITY): Payer: Medicare Other

## 2017-07-23 ENCOUNTER — Other Ambulatory Visit (HOSPITAL_COMMUNITY): Payer: Medicare Other

## 2017-07-24 ENCOUNTER — Ambulatory Visit (HOSPITAL_COMMUNITY): Payer: Medicare Other | Admitting: Hematology and Oncology

## 2017-07-24 ENCOUNTER — Inpatient Hospital Stay (HOSPITAL_COMMUNITY): Payer: Medicare Other

## 2017-07-24 VITALS — BP 139/56 | HR 66 | Temp 97.8°F | Resp 16 | Wt 150.0 lb

## 2017-07-24 DIAGNOSIS — C9001 Multiple myeloma in remission: Secondary | ICD-10-CM | POA: Diagnosis not present

## 2017-07-24 DIAGNOSIS — N183 Chronic kidney disease, stage 3 unspecified: Secondary | ICD-10-CM

## 2017-07-24 DIAGNOSIS — E538 Deficiency of other specified B group vitamins: Secondary | ICD-10-CM

## 2017-07-24 DIAGNOSIS — D631 Anemia in chronic kidney disease: Secondary | ICD-10-CM

## 2017-07-24 DIAGNOSIS — D801 Nonfamilial hypogammaglobulinemia: Secondary | ICD-10-CM

## 2017-07-24 DIAGNOSIS — D509 Iron deficiency anemia, unspecified: Secondary | ICD-10-CM

## 2017-07-24 DIAGNOSIS — C9000 Multiple myeloma not having achieved remission: Secondary | ICD-10-CM

## 2017-07-24 LAB — COMPREHENSIVE METABOLIC PANEL
ALBUMIN: 3.4 g/dL — AB (ref 3.5–5.0)
ALK PHOS: 98 U/L (ref 38–126)
ALT: 14 U/L (ref 14–54)
ANION GAP: 11 (ref 5–15)
AST: 21 U/L (ref 15–41)
BUN: 20 mg/dL (ref 6–20)
CALCIUM: 9.2 mg/dL (ref 8.9–10.3)
CO2: 26 mmol/L (ref 22–32)
CREATININE: 1.35 mg/dL — AB (ref 0.44–1.00)
Chloride: 99 mmol/L — ABNORMAL LOW (ref 101–111)
GFR calc Af Amer: 43 mL/min — ABNORMAL LOW (ref >60)
GFR calc non Af Amer: 37 mL/min — ABNORMAL LOW (ref >60)
GLUCOSE: 89 mg/dL (ref 65–99)
Potassium: 4.3 mmol/L (ref 3.5–5.1)
SODIUM: 136 mmol/L (ref 135–145)
Total Bilirubin: 0.4 mg/dL (ref 0.3–1.2)
Total Protein: 7 g/dL (ref 6.5–8.1)

## 2017-07-24 LAB — CBC WITH DIFFERENTIAL/PLATELET
BASOS PCT: 0 %
Basophils Absolute: 0 10*3/uL (ref 0.0–0.1)
EOS ABS: 0.1 10*3/uL (ref 0.0–0.7)
EOS PCT: 1 %
HCT: 36.9 % (ref 36.0–46.0)
HEMOGLOBIN: 11 g/dL — AB (ref 12.0–15.0)
LYMPHS PCT: 12 %
Lymphs Abs: 1 10*3/uL (ref 0.7–4.0)
MCH: 25.3 pg — AB (ref 26.0–34.0)
MCHC: 29.8 g/dL — AB (ref 30.0–36.0)
MCV: 84.8 fL (ref 78.0–100.0)
Monocytes Absolute: 1 10*3/uL (ref 0.1–1.0)
Monocytes Relative: 12 %
NEUTROS ABS: 6 10*3/uL (ref 1.7–7.7)
Neutrophils Relative %: 75 %
Platelets: 274 10*3/uL (ref 150–400)
RBC: 4.35 MIL/uL (ref 3.87–5.11)
RDW: 22 % — ABNORMAL HIGH (ref 11.5–15.5)
WBC: 8.1 10*3/uL (ref 4.0–10.5)

## 2017-07-24 MED ORDER — DARBEPOETIN ALFA 300 MCG/0.6ML IJ SOSY
300.0000 ug | PREFILLED_SYRINGE | Freq: Once | INTRAMUSCULAR | Status: AC
  Administered 2017-07-24: 300 ug via SUBCUTANEOUS
  Filled 2017-07-24: qty 0.6

## 2017-07-24 MED ORDER — SODIUM CHLORIDE 0.9 % IV SOLN
3.0000 mg | Freq: Once | INTRAVENOUS | Status: AC
  Administered 2017-07-24: 3 mg via INTRAVENOUS
  Filled 2017-07-24: qty 3.75

## 2017-07-24 NOTE — Patient Instructions (Signed)
[place] at [facility] Discharge Instructions  RECOMMENDATIONS MADE BY THE CONSULTANT AND ANY TEST RESULTS WILL BE SENT TO YOUR REFERRING PHYSICIAN.  Velcade on hold for one month per patient/Dr.Rodriguez Zometa given and Aransesp given Follow up as scheduled.  Thank you for choosing [place] at [facility] to provide your oncology and hematology care.  To afford each patient quality time with our provider, please arrive at least 15 minutes before your scheduled appointment time.    If you have a lab appointment with the [place] please come in thru the  Main Entrance and check in at the main information desk  You need to re-schedule your appointment should you arrive 10 or more minutes late.  We strive to give you quality time with our providers, and arriving late affects you and other patients whose appointments are after yours.  Also, if you no show three or more times for appointments you may be dismissed from the clinic at the providers discretion.     Again, thank you for choosing [facility].  Our hope is that these requests will decrease the amount of time that you wait before being seen by our physicians.       _____________________________________________________________  Should you have questions after your visit to [facility], please contact our office at [phone] between the hours of 8:30 a.m. and 4:30 p.m.  Voicemails left after 4:30 p.m. will not be returned until the following business day.  For prescription refill requests, have your pharmacy contact our office.       Resources For Cancer Patients and their Caregivers ? American Cancer Society: Can assist with transportation, wigs, general needs, runs Look Good Feel Better.        3096790321 ? Cancer Care: Provides financial assistance, online support groups, medication/co-pay assistance.  1-800-813-HOPE 903-665-9740) ? [place] Assists [place] cancer patients and their families through emotional , educational and financial support.  785-105-2495 ? [place] DSS Where to apply for food stamps, Medicaid and utility assistance. 309 361 7036 ? RCATS: Transportation to medical appointments. (639)156-5959 ? Social Security Administration: May apply for disability if have a Stage IV cancer. 939-204-1681 [phone])547-2846 ? [employer], Disability and Transit Services: Assists with nutrition, care and transit needs. [place] Support Programs: @10RELATIVEDAYS @ > Cancer Support Group  2nd Tuesday of the month 1pm-2pm, Journey Room  > Creative Journey  3rd Tuesday of the month 1130am-1pm, Journey Room  > Look Good Feel Better  1st Wednesday of the month 10am-12 noon, Journey Room (Call [place] to register [phone])

## 2017-07-24 NOTE — Progress Notes (Signed)
Patient presented today for zometa, aranesp, and velcade. Patient stated per Dr. Norma Fredrickson to hold her velcade for one month due to her M-Spike being elevated. He is going to fax over orders for [place] to follow up on her labs.  Dr. Lebron Conners notified.   Carol Bradley presents today for injection per MD orders. Aranesp 300 mcg administered SQ in right Upper Arm. Administration without incident. Patient tolerated well.   Treatment given per orders. Patient tolerated it well without problems. Vitals stable and discharged home from clinic ambulatory. Follow up as scheduled.

## 2017-07-25 LAB — ANTI-PARIETAL ANTIBODY: PARIETAL CELL ANTIBODY-IGG: 9 U (ref 0.0–20.0)

## 2017-07-25 LAB — INTRINSIC FACTOR ANTIBODIES: INTRINSIC FACTOR: 1.1 [AU]/ml (ref 0.0–1.1)

## 2017-08-03 ENCOUNTER — Encounter (HOSPITAL_COMMUNITY): Payer: Self-pay | Admitting: Adult Health

## 2017-08-03 NOTE — Progress Notes (Signed)
Documentation reviewed from pt's recent visit to see Dr. Norma Fredrickson at [facility].   He recommends holding Velcade for 1 month (beginning 07/23/17) d/t recurrent episodes of diarrhea.  Dr. Norma Fredrickson recommends the following changes in therapy:   -Ixazomib 2.3 mg, Pomalidomide 2 mg, and Decadron 10 mg in 08/2017 ash seh has persistent evidence of chemical relapse that has remained stable since 05/2017.    She is scheduled to see Dr. Walden Field next week on 08/06/17. Will allow Dr. Walden Field to review these recommendations and place orders at follow-up visit.    Mike Craze, NP [place] [phone]

## 2017-08-06 ENCOUNTER — Other Ambulatory Visit: Payer: Self-pay

## 2017-08-06 ENCOUNTER — Inpatient Hospital Stay (HOSPITAL_COMMUNITY): Payer: Medicare Other

## 2017-08-06 ENCOUNTER — Encounter (HOSPITAL_COMMUNITY): Payer: Self-pay | Admitting: Internal Medicine

## 2017-08-06 ENCOUNTER — Inpatient Hospital Stay (HOSPITAL_COMMUNITY): Payer: Medicare Other | Admitting: Internal Medicine

## 2017-08-06 DIAGNOSIS — C9001 Multiple myeloma in remission: Secondary | ICD-10-CM | POA: Diagnosis not present

## 2017-08-06 DIAGNOSIS — D631 Anemia in chronic kidney disease: Secondary | ICD-10-CM

## 2017-08-06 DIAGNOSIS — N183 Chronic kidney disease, stage 3 unspecified: Secondary | ICD-10-CM

## 2017-08-06 LAB — CBC WITH DIFFERENTIAL/PLATELET
BASOS ABS: 0 10*3/uL (ref 0.0–0.1)
BASOS PCT: 0 %
Eosinophils Absolute: 0.1 10*3/uL (ref 0.0–0.7)
Eosinophils Relative: 1 %
HEMATOCRIT: 38.6 % (ref 36.0–46.0)
HEMOGLOBIN: 11.7 g/dL — AB (ref 12.0–15.0)
Lymphocytes Relative: 11 %
Lymphs Abs: 1.1 10*3/uL (ref 0.7–4.0)
MCH: 26.6 pg (ref 26.0–34.0)
MCHC: 30.3 g/dL (ref 30.0–36.0)
MCV: 87.7 fL (ref 78.0–100.0)
MONOS PCT: 9 %
Monocytes Absolute: 0.8 10*3/uL (ref 0.1–1.0)
NEUTROS ABS: 7.8 10*3/uL — AB (ref 1.7–7.7)
NEUTROS PCT: 79 %
Platelets: 267 10*3/uL (ref 150–400)
RBC: 4.4 MIL/uL (ref 3.87–5.11)
RDW: 24 % — ABNORMAL HIGH (ref 11.5–15.5)
WBC: 9.8 10*3/uL (ref 4.0–10.5)

## 2017-08-06 NOTE — Progress Notes (Signed)
Hemoglobin 11.7.   No aranesp needed today per protocol.

## 2017-08-10 ENCOUNTER — Other Ambulatory Visit: Payer: Self-pay

## 2017-08-10 ENCOUNTER — Encounter (HOSPITAL_COMMUNITY): Payer: Self-pay

## 2017-08-10 ENCOUNTER — Inpatient Hospital Stay (HOSPITAL_COMMUNITY): Payer: Medicare Other | Attending: Oncology

## 2017-08-10 VITALS — BP 123/51 | HR 67 | Resp 18

## 2017-08-10 DIAGNOSIS — D801 Nonfamilial hypogammaglobulinemia: Secondary | ICD-10-CM | POA: Insufficient documentation

## 2017-08-10 DIAGNOSIS — G620 Drug-induced polyneuropathy: Secondary | ICD-10-CM | POA: Diagnosis not present

## 2017-08-10 DIAGNOSIS — E876 Hypokalemia: Secondary | ICD-10-CM | POA: Insufficient documentation

## 2017-08-10 DIAGNOSIS — E538 Deficiency of other specified B group vitamins: Secondary | ICD-10-CM | POA: Diagnosis not present

## 2017-08-10 DIAGNOSIS — N183 Chronic kidney disease, stage 3 (moderate): Secondary | ICD-10-CM | POA: Diagnosis not present

## 2017-08-10 DIAGNOSIS — I130 Hypertensive heart and chronic kidney disease with heart failure and stage 1 through stage 4 chronic kidney disease, or unspecified chronic kidney disease: Secondary | ICD-10-CM | POA: Insufficient documentation

## 2017-08-10 DIAGNOSIS — C9001 Multiple myeloma in remission: Secondary | ICD-10-CM | POA: Diagnosis present

## 2017-08-10 DIAGNOSIS — D508 Other iron deficiency anemias: Secondary | ICD-10-CM | POA: Insufficient documentation

## 2017-08-10 DIAGNOSIS — I2510 Atherosclerotic heart disease of native coronary artery without angina pectoris: Secondary | ICD-10-CM | POA: Insufficient documentation

## 2017-08-10 DIAGNOSIS — Z87891 Personal history of nicotine dependence: Secondary | ICD-10-CM | POA: Diagnosis not present

## 2017-08-10 DIAGNOSIS — J449 Chronic obstructive pulmonary disease, unspecified: Secondary | ICD-10-CM | POA: Insufficient documentation

## 2017-08-10 DIAGNOSIS — T451X5S Adverse effect of antineoplastic and immunosuppressive drugs, sequela: Secondary | ICD-10-CM | POA: Insufficient documentation

## 2017-08-10 DIAGNOSIS — Z79899 Other long term (current) drug therapy: Secondary | ICD-10-CM | POA: Diagnosis not present

## 2017-08-10 DIAGNOSIS — D631 Anemia in chronic kidney disease: Secondary | ICD-10-CM | POA: Diagnosis not present

## 2017-08-10 DIAGNOSIS — E785 Hyperlipidemia, unspecified: Secondary | ICD-10-CM | POA: Diagnosis not present

## 2017-08-10 DIAGNOSIS — J431 Panlobular emphysema: Secondary | ICD-10-CM | POA: Insufficient documentation

## 2017-08-10 DIAGNOSIS — Z9221 Personal history of antineoplastic chemotherapy: Secondary | ICD-10-CM | POA: Insufficient documentation

## 2017-08-10 DIAGNOSIS — I5032 Chronic diastolic (congestive) heart failure: Secondary | ICD-10-CM | POA: Diagnosis not present

## 2017-08-10 MED ORDER — CYANOCOBALAMIN 1000 MCG/ML IJ SOLN
1000.0000 ug | Freq: Once | INTRAMUSCULAR | Status: AC
  Administered 2017-08-10: 1000 ug via INTRAMUSCULAR

## 2017-08-10 NOTE — Progress Notes (Signed)
Carol Bradley presents today for injection per MD orders. B12 1000 mcg administered IM in left deltoid. Administration without incident. Patient tolerated well. Patient discharged ambulatory and in stable condition with family.  Patient to follow up as scheduled.

## 2017-08-10 NOTE — Patient Instructions (Signed)
[place] at [facility] Discharge Instructions  RECOMMENDATIONS MADE BY THE CONSULTANT AND ANY TEST RESULTS WILL BE SENT TO YOUR REFERRING PHYSICIAN.  You had your B-12 injection.  Follow up as scheduled.   Thank you for choosing [place] at [facility] to provide your oncology and hematology care.  To afford each patient quality time with our provider, please arrive at least 15 minutes before your scheduled appointment time.    If you have a lab appointment with the [place] please come in thru the  Main Entrance and check in at the main information desk  You need to re-schedule your appointment should you arrive 10 or more minutes late.  We strive to give you quality time with our providers, and arriving late affects you and other patients whose appointments are after yours.  Also, if you no show three or more times for appointments you may be dismissed from the clinic at the providers discretion.     Again, thank you for choosing [facility].  Our hope is that these requests will decrease the amount of time that you wait before being seen by our physicians.       _____________________________________________________________  Should you have questions after your visit to [facility], please contact our office at [phone] between the hours of 8:30 a.m. and 4:30 p.m.  Voicemails left after 4:30 p.m. will not be returned until the following business day.  For prescription refill requests, have your pharmacy contact our office.       Resources For Cancer Patients and their Caregivers ? American Cancer Society: Can assist with transportation, wigs, general needs, runs Look Good Feel Better.        ([phone] ? Cancer Care: Provides financial assistance, online support groups, medication/co-pay assistance.  1-800-813-HOPE 423-696-0880) ? [place] Assists [place] cancer  patients and their families through emotional , educational and financial support.  504-100-5985 ? [place] DSS Where to apply for food stamps, Medicaid and utility assistance. (614)440-3973 ? RCATS: Transportation to medical appointments. 619-468-2805 ? Social Security Administration: May apply for disability if have a Stage IV cancer. 505-280-0495 587-778-0615 ? [employer], Disability and Transit Services: Assists with nutrition, care and transit needs. [place] Support Programs: @10RELATIVEDAYS @ > Cancer Support Group  2nd Tuesday of the month 1pm-2pm, Journey Room  > Creative Journey  3rd Tuesday of the month 1130am-1pm, Journey Room  > Look Good Feel Better  1st Wednesday of the month 10am-12 noon, Journey Room (Call [place] to register [phone])

## 2017-08-24 ENCOUNTER — Inpatient Hospital Stay (HOSPITAL_COMMUNITY): Payer: Medicare Other

## 2017-08-24 ENCOUNTER — Inpatient Hospital Stay (HOSPITAL_BASED_OUTPATIENT_CLINIC_OR_DEPARTMENT_OTHER): Payer: Medicare Other | Admitting: Oncology

## 2017-08-24 ENCOUNTER — Encounter (HOSPITAL_COMMUNITY): Payer: Self-pay | Admitting: Oncology

## 2017-08-24 VITALS — BP 139/48 | HR 61 | Temp 98.1°F | Resp 18 | Wt 148.0 lb

## 2017-08-24 DIAGNOSIS — E8809 Other disorders of plasma-protein metabolism, not elsewhere classified: Secondary | ICD-10-CM

## 2017-08-24 DIAGNOSIS — I10 Essential (primary) hypertension: Secondary | ICD-10-CM

## 2017-08-24 DIAGNOSIS — D508 Other iron deficiency anemias: Secondary | ICD-10-CM

## 2017-08-24 DIAGNOSIS — Z79899 Other long term (current) drug therapy: Secondary | ICD-10-CM

## 2017-08-24 DIAGNOSIS — J431 Panlobular emphysema: Secondary | ICD-10-CM

## 2017-08-24 DIAGNOSIS — E785 Hyperlipidemia, unspecified: Secondary | ICD-10-CM

## 2017-08-24 DIAGNOSIS — I2510 Atherosclerotic heart disease of native coronary artery without angina pectoris: Secondary | ICD-10-CM

## 2017-08-24 DIAGNOSIS — Z5111 Encounter for antineoplastic chemotherapy: Secondary | ICD-10-CM

## 2017-08-24 DIAGNOSIS — E538 Deficiency of other specified B group vitamins: Secondary | ICD-10-CM

## 2017-08-24 DIAGNOSIS — E876 Hypokalemia: Secondary | ICD-10-CM

## 2017-08-24 DIAGNOSIS — D801 Nonfamilial hypogammaglobulinemia: Secondary | ICD-10-CM

## 2017-08-24 DIAGNOSIS — G620 Drug-induced polyneuropathy: Secondary | ICD-10-CM

## 2017-08-24 DIAGNOSIS — D631 Anemia in chronic kidney disease: Secondary | ICD-10-CM | POA: Diagnosis not present

## 2017-08-24 DIAGNOSIS — N183 Chronic kidney disease, stage 3 unspecified: Secondary | ICD-10-CM

## 2017-08-24 DIAGNOSIS — I5032 Chronic diastolic (congestive) heart failure: Secondary | ICD-10-CM | POA: Diagnosis not present

## 2017-08-24 DIAGNOSIS — C9001 Multiple myeloma in remission: Secondary | ICD-10-CM | POA: Diagnosis not present

## 2017-08-24 DIAGNOSIS — I130 Hypertensive heart and chronic kidney disease with heart failure and stage 1 through stage 4 chronic kidney disease, or unspecified chronic kidney disease: Secondary | ICD-10-CM | POA: Diagnosis not present

## 2017-08-24 DIAGNOSIS — C9000 Multiple myeloma not having achieved remission: Secondary | ICD-10-CM

## 2017-08-24 DIAGNOSIS — J449 Chronic obstructive pulmonary disease, unspecified: Secondary | ICD-10-CM

## 2017-08-24 DIAGNOSIS — N189 Chronic kidney disease, unspecified: Secondary | ICD-10-CM | POA: Diagnosis not present

## 2017-08-24 DIAGNOSIS — Z87891 Personal history of nicotine dependence: Secondary | ICD-10-CM

## 2017-08-24 LAB — CBC WITH DIFFERENTIAL/PLATELET
BASOS PCT: 0 %
Basophils Absolute: 0 10*3/uL (ref 0.0–0.1)
EOS ABS: 0 10*3/uL (ref 0.0–0.7)
EOS PCT: 0 %
HCT: 38.8 % (ref 36.0–46.0)
HEMOGLOBIN: 12 g/dL (ref 12.0–15.0)
LYMPHS ABS: 1.3 10*3/uL (ref 0.7–4.0)
Lymphocytes Relative: 12 %
MCH: 27.5 pg (ref 26.0–34.0)
MCHC: 30.9 g/dL (ref 30.0–36.0)
MCV: 88.8 fL (ref 78.0–100.0)
Monocytes Absolute: 0.9 10*3/uL (ref 0.1–1.0)
Monocytes Relative: 7 %
NEUTROS PCT: 81 %
Neutro Abs: 9.2 10*3/uL — ABNORMAL HIGH (ref 1.7–7.7)
PLATELETS: 253 10*3/uL (ref 150–400)
RBC: 4.37 MIL/uL (ref 3.87–5.11)
RDW: 22.8 % — ABNORMAL HIGH (ref 11.5–15.5)
WBC: 11.4 10*3/uL — AB (ref 4.0–10.5)

## 2017-08-24 LAB — COMPREHENSIVE METABOLIC PANEL
ALT: 15 U/L (ref 14–54)
ANION GAP: 9 (ref 5–15)
AST: 22 U/L (ref 15–41)
Albumin: 3.6 g/dL (ref 3.5–5.0)
Alkaline Phosphatase: 76 U/L (ref 38–126)
BUN: 29 mg/dL — ABNORMAL HIGH (ref 6–20)
CHLORIDE: 103 mmol/L (ref 101–111)
CO2: 25 mmol/L (ref 22–32)
CREATININE: 1.06 mg/dL — AB (ref 0.44–1.00)
Calcium: 8.9 mg/dL (ref 8.9–10.3)
GFR calc non Af Amer: 50 mL/min — ABNORMAL LOW (ref >60)
GFR, EST AFRICAN AMERICAN: 58 mL/min — AB (ref >60)
Glucose, Bld: 150 mg/dL — ABNORMAL HIGH (ref 65–99)
Potassium: 4.1 mmol/L (ref 3.5–5.1)
SODIUM: 137 mmol/L (ref 135–145)
Total Bilirubin: 0.4 mg/dL (ref 0.3–1.2)
Total Protein: 7.1 g/dL (ref 6.5–8.1)

## 2017-08-24 MED ORDER — DEXAMETHASONE 2 MG PO TABS: ORAL_TABLET | ORAL | 3 refills | Status: DC

## 2017-08-24 MED ORDER — IXAZOMIB CITRATE 2.3 MG PO CAPS: ORAL_CAPSULE | ORAL | 0 refills | Status: DC

## 2017-08-24 MED ORDER — POMALIDOMIDE 2 MG PO CAPS: 2.0000 mg | ORAL_CAPSULE | Freq: Every day | ORAL | 0 refills | Status: DC

## 2017-08-24 NOTE — Patient Instructions (Signed)
[place] at [facility] Discharge Instructions  RECOMMENDATIONS MADE BY THE CONSULTANT AND ANY TEST RESULTS WILL BE SENT TO YOUR REFERRING PHYSICIAN.  Labs in 2 weeks. B12 injection in 2 weeks. Return 4-6 weeks for follow-up. Zometa infusion is due in April 2019. We will refer you for chemotherapy teaching.     Thank you for choosing [place] at [facility] to provide your oncology and hematology care.  To afford each patient quality time with our provider, please arrive at least 15 minutes before your scheduled appointment time.    If you have a lab appointment with the [place] please come in thru the  Main Entrance and check in at the main information desk  You need to re-schedule your appointment should you arrive 10 or more minutes late.  We strive to give you quality time with our providers, and arriving late affects you and other patients whose appointments are after yours.  Also, if you no show three or more times for appointments you may be dismissed from the clinic at the providers discretion.     Again, thank you for choosing [facility].  Our hope is that these requests will decrease the amount of time that you wait before being seen by our physicians.       _____________________________________________________________  Should you have questions after your visit to [facility], please contact our office at [phone] between the hours of 8:30 a.m. and 4:30 p.m.  Voicemails left after 4:30 p.m. will not be returned until the following business day.  For prescription refill requests, have your pharmacy contact our office.       Resources For Cancer Patients and their Caregivers ? American Cancer Society: Can assist with transportation, wigs, general needs, runs Look Good Feel Better.        415-397-9417 ? Cancer Care: Provides financial assistance, online support groups,  medication/co-pay assistance.  1-800-813-HOPE 514-539-6906) ? [place] Assists [place] cancer patients and their families through emotional , educational and financial support.  ([phone] ? [place] DSS Where to apply for food stamps, Medicaid and utility assistance. 405-114-2863 ? RCATS: Transportation to medical appointments. 561-283-4482 ? Social Security Administration: May apply for disability if have a Stage IV cancer. [phone] 681-536-3299 ? [employer], Disability and Transit Services: Assists with nutrition, care and transit needs. [place] Support Programs: @10RELATIVEDAYS @ > Cancer Support Group  2nd Tuesday of the month 1pm-2pm, Journey Room  > Creative Journey  3rd Tuesday of the month 1130am-1pm, Journey Room  > Look Good Feel Better  1st Wednesday of the month 10am-12 noon, Journey Room (Call [place] to register [phone])

## 2017-08-24 NOTE — Assessment & Plan Note (Addendum)
IgG kappa multiple myeloma- starting Pomalyst 2 mg (days 1-21 every 28 days), ixazomib 2.3 mg (days 1, 8, 15 every 29 days), and Dexamethasone 10 mg (weekly) in 08/2017 due to chemical relapse.  We will continue with Zometa every 3 months.  Labs today: CBC diff, CMET.  I personally reviewed and went over laboratory results with the patient.  The results are noted within this dictation.  Labs demonstrate a white blood cell count of 11.4, normal hemoglobin at 12.0 g/dL, platelet count 253,000.  ANC is mildly elevated 9.2.  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  Rxs printed for aforementioned medications and treatment schedule.  Rxs given to Angie for benefits evaluation/submission.  Labs in 2 weeks: CBC diff, CMET, SPEP + IFE, light chain assay, B2M, LDH.  These will be her new baseline lab results.  Will refer patient for chemotherapy teaching for abovementioned chemotherapy medications/regimen.  Next of med infusion is due in April 2019 (every 3 months).  Return in 4 weeks for follow-up.  Will reevaluate response to therapy after approximately 3 months of treatment.  If response is noted, we will continue with current dosing schedule as mentioned above.  If response is suboptimal or lack of response is noted after 3 months of treatment, would recommend increasing ixazomib dose if patient is tolerating.  If ongoing tolerance is noted, I would not hesitate to increase Pomalyst after ixazomib dose increase.

## 2017-08-24 NOTE — Progress Notes (Signed)
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 Jacquiline Doe and Dr. Tressie Stalker) in 2016. She was started on induction therapy with Vd (03/18/2015- 07/28/2015) with monthly Zometa. Velcade was discontinued due to neuropathy, despite an excellent response with M-protein dropping to 0.2. She was then transitioned to Rd (Revlimid 10 mg weekly) in 08/2015 with an escalation in Revlmid dose to 15 mg in 09/2015. In May 2017- June 2017, she received two doses of cyclophosphamide in an attempt to induce a CR, but this was complicated by hospitalization secondary to infection. She continued her Revlimid until 05/2016 at which time it was discontinued due to development of anemia and remission of disease (undetectable M-protein). She was therefore placed on active surveillance complicated by infections resulting in monthly low-dose IVIG infusions. She has been requiring blood transfusions intermittently and ESA support/IV iron support. She was seen by Dr. Norma Fredrickson at [facility] in consultation on 11/06/2016. Bone marrow aspiration and biopsy on 11/14/2016 demonstrated <2% plasma cells by IHC and mild interstitial  fibrosis. Serology and biopsy confirmed CR from multiple myeloma standpoint. She was restarted on maintenance Velcade in 11/2016, but this was discontinued in 06/2017 due to diarrhea. She was seen by Dr. Norma Fredrickson in 07/2017 who recommended a change in therapy to Ixazomib 2.3 mg (days 1, 8, 15 every 28 days), Pomalidomide 2 mg (days 1-21 every 28 days), and Dexamethasone 10 mg (weekly).  HPI Elements   Location:  Blood, bone marrow  Quality:  Multiple myeloma, IgG kappa  Severity:  Controlled  Duration:  Diagnosed in 2016  Context:  Changing therapy based upon [facility] recommendations  Timing:  Zometa every 3 months  Modifying Factors:  Anemia of chronic renal disease.  B12 deficiency.  Associated Signs & Symptoms:     CURRENT THERAPY: B12 monthly.  Zometa every 3 months.  Changing therapy based upon Dr. Norma Fredrickson recommendations.  CURRENT STATUS: Carol Bradley 77 y.o. female returns for followup of multiple myeloma.  She has been off therapy since July 09, 2017.  She also continues with B12 injections monthly.  She continues with Zometa every 3 months.  Dr. Norma Fredrickson at [facility] made recommendations to change therapy to Ixazomib, Pomalidomide, Dexamethasone.  She looks extraordinarily well today.  She feels well she reports.  She denies any new aches or pains.  She denies any new lumps or bumps.  She is well-kempt today and dressed nicely.  She denies any complaints today.  She is here to learn medical oncology recommendations moving forward after seeing Dr. Norma Fredrickson in January 2019.  She rates her appetite and energy level at 50%.   Review of Systems  Constitutional: Negative.  Negative for chills, fever and weight loss.  HENT: Negative.   Eyes: Negative.   Respiratory: Negative.  Negative for cough.   Cardiovascular: Negative.  Negative  for chest pain.  Gastrointestinal: Negative.  Negative for blood in stool, constipation, diarrhea, melena, nausea and vomiting.  Genitourinary: Negative.     Musculoskeletal: Negative.   Skin: Negative.   Neurological: Negative.  Negative for weakness.  Endo/Heme/Allergies: Negative.   Psychiatric/Behavioral: Negative.     Past Medical History:  Diagnosis Date  . Anemia associated with stage 3 chronic renal failure ([place]) 04/13/2016  . CAD (coronary artery disease)   . COPD (chronic obstructive pulmonary disease) ([place])   . History of tobacco abuse   . Hyperlipidemia   . Hypertension   . Hypogammaglobulinemia ([place]) 01/15/2016  . Multiple myeloma ([place])   . Vitamin B12 deficiency 04/15/2016   Overview:  Vitamin B12 level documented 155, January 2016 with the normal range being 211-924    Past Surgical History:  Procedure Laterality Date  . BACK SURGERY    . CARDIAC CATHETERIZATION  04/2011   right and left cath showing normal right heart pressures,but newly diagnosed coronary artery disease/drug eluting stent placed to RCA with residual disease in the proximal RCA and LAD and ramus, normal LV function and 60-65% EF  . COLONOSCOPY N/A 09/30/2014   Procedure: COLONOSCOPY;  Surgeon: Rogene Houston, MD;  Location: [facility];  Service: Endoscopy;  Laterality: N/A;  225  . CORONARY ANGIOPLASTY WITH STENT PLACEMENT  04/2011   mid RCA: 3.0 X38 mm Promus DES. Residual 40% disease proximally  . NECK SURGERY      Family History  Problem Relation Age of Onset  . Heart failure Mother   . Cancer Father     Social History   Socioeconomic History  . Marital status: Widowed    Spouse name: None  . Number of children: None  . Years of education: None  . Highest education level: None  Social Needs  . Financial resource strain: None  . Food insecurity - worry: None  . Food insecurity - inability: None  . Transportation needs - medical: None  . Transportation needs - non-medical: None  Occupational History  . 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 Smoking status: Former Smoker    Packs/day: 3.00    Years:  25.00    Pack years: 75.00    Types: Cigarettes    Last attempt to quit: 07/10/1994    Years since quitting: 23.1  . Smokeless tobacco: Never Used  Substance and Sexual Activity  . Alcohol use: No  . Drug use: None  . Sexual activity: None  Other Topics Concern  . None  Social History Narrative  . None     PHYSICAL EXAMINATION  ECOG PERFORMANCE STATUS: 1 - Symptomatic but completely ambulatory  Vitals:   08/24/17 1140  BP: (!) 139/48  Pulse: 61  Resp: 18  Temp: 98.1 F (36.7 C)  SpO2: 98%    GENERAL:alert, healthy, no distress, well nourished, well developed, comfortable, cooperative, smiling and unaccompanied SKIN: skin color, texture, turgor are normal, no rashes or significant lesions HEAD: Normocephalic, No masses, lesions, tenderness or abnormalities EYES: normal, EOMI, Conjunctiva are pink and non-injected EARS: External ears normal OROPHARYNX:lips, buccal mucosa, and tongue normal and mucous membranes are moist  NECK: supple, no adenopathy, trachea midline LYMPH:  no palpable lymphadenopathy BREAST:not examined LUNGS: clear to auscultation and percussion HEART: regular rate & rhythm and no murmurs ABDOMEN:abdomen soft, non-tender and normal bowel sounds BACK: Back symmetric, no curvature. EXTREMITIES:less then 2 second capillary refill, no joint deformities, effusion, or inflammation, no skin  discoloration, no cyanosis  NEURO: alert & oriented x 3 with fluent speech, no focal motor/sensory deficits, gait normal   LABORATORY DATA: CBC    Component Value Date/Time   WBC 11.4 (H) 08/24/2017 1107   RBC 4.37 08/24/2017 1107   HGB 12.0 08/24/2017 1107   HCT 38.8 08/24/2017 1107   PLT 253 08/24/2017 1107   MCV 88.8 08/24/2017 1107   MCH 27.5 08/24/2017 1107   MCHC 30.9 08/24/2017 1107   RDW 22.8 (H) 08/24/2017 1107   LYMPHSABS 1.3 08/24/2017 1107   MONOABS 0.9 08/24/2017 1107   EOSABS 0.0 08/24/2017 1107   BASOSABS 0.0 08/24/2017 1107      Chemistry       Component Value Date/Time   NA 137 08/24/2017 1107   K 4.1 08/24/2017 1107   CL 103 08/24/2017 1107   CO2 25 08/24/2017 1107   BUN 29 (H) 08/24/2017 1107   CREATININE 1.06 (H) 08/24/2017 1107      Component Value Date/Time   CALCIUM 8.9 08/24/2017 1107   ALKPHOS 76 08/24/2017 1107   AST 22 08/24/2017 1107   ALT 15 08/24/2017 1107   BILITOT 0.4 08/24/2017 1107       RADIOGRAPHIC STUDIES:  No results found.   PATHOLOGY:    ASSESSMENT AND PLAN:  Multiple myeloma (HCC) IgG kappa multiple myeloma- starting Pomalyst 2 mg (days 1-21 every 28 days), ixazomib 2.3 mg (days 1, 8, 15 every 29 days), and Dexamethasone 10 mg (weekly) in 08/2017 due to chemical relapse.  We will continue with Zometa every 3 months.  Labs today: CBC diff, CMET.  I personally reviewed and went over laboratory results with the patient.  The results are noted within this dictation.  Labs demonstrate a white blood cell count of 11.4, normal hemoglobin at 12.0 g/dL, platelet count 253,000.  ANC is mildly elevated 9.2.  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  Rxs printed for aforementioned medications and treatment schedule.  Rxs given to Angie for benefits evaluation/submission.  Labs in 2 weeks: CBC diff, CMET, SPEP + IFE, light chain assay, B2M, LDH.  These will be her new baseline lab results.  Will refer patient for chemotherapy teaching for abovementioned chemotherapy medications/regimen.  Next of med infusion is due in April 2019 (every 3 months).  Return in 4 weeks for follow-up.  Will reevaluate response to therapy after approximately 3 months of treatment.  If response is noted, we will continue with current dosing schedule as mentioned above.  If response is suboptimal or lack of response is noted after 3 months of treatment, would recommend increasing ixazomib dose if patient is tolerating.  If ongoing tolerance is noted, I would not hesitate to increase Pomalyst  after ixazomib dose increase.  2. Encounter for chemotherapy management We will plan on starting therapy as mentioned above this month.  Prescriptions are printed today accordingly.  3. Neuropathy due to drug ([place]) Secondary to Velcade, improved  4. 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  Chronic renal disease is stable today.  5. Chronic renal disease, stage 3, moderately decreased glomerular filtration rate between 30-59 mL/min/1.73 square meter ([place]) Noted on laboratory work today.  6. Hypogammaglobulinemia (HCC) Stable  7. Vitamin B12 deficiency On B12 replacement therapy every month.  Next injection is due in 2 weeks  8. Other iron deficiency anemia Requiring IV iron in the past.  Will monitor moving forward  9. Chronic diastolic congestive  heart failure ([place]) Well compensated today.  No signs/symptoms of hypervolemia.  10. Panlobular emphysema (HCC) Clear to auscultation today  11. Hypokalemia Resolved today  12. Hypoalbuminemia Within normal limits today at 3.6  13. Essential hypertension Well-controlled  Final Result of Complexity      Choose decision making level with 2 or 3 checks OR choose the decision making level on Section B       A Number of diagnoses or treatment options  '[]'$   </= 1 Minimal  '[]'$   2 Limited  '[]'$   3 Multiple  '[x]'$   >/= 4 Extensive  B Amount and complexity of data  '[]'$   </= 1 Minimal or low  '[]'$   2 Limited  '[x]'$   3  Moderate  '[]'$   >/= 4 Extensive  C Highest risk  '[]'$   Minimal  '[]'$   Low  '[]'$   Moderate  '[x]'$   High   Type of decision making  '[]'$   Straight-forward  '[]'$   Low Complexity  '[]'$   Moderate- Complexity  '[x]'$   High- Complexity     ORDERS PLACED FOR THIS ENCOUNTER: Orders Placed This Encounter  Procedures  . CBC with Differential  . Comprehensive metabolic panel  . Lactate dehydrogenase  . Beta 2 microglobuline, serum  . IgG, IgA, IgM  . Immunofixation  electrophoresis  . Protein electrophoresis, serum    MEDICATIONS PRESCRIBED THIS ENCOUNTER: Meds ordered this encounter  Medications  . ixazomib citrate (NINLARO) 2.3 MG capsule    Sig: Take on an empty stomach 1hr before or 2hrs after food. Do not crush, chew or open.    Dispense:  3 capsule    Refill:  0    Cycle #1    Order Specific Question:   Supervising Provider    Answer:   Brunetta Genera [[id number]]  . pomalidomide (POMALYST) 2 MG capsule    Sig: Take 1 capsule (2 mg total) by mouth daily. Take with water on days 1-21. Repeat every 28 days.    Dispense:  21 capsule    Refill:  0    Cycle #1    Order Specific Question:   Supervising Provider    Answer:   Brunetta Genera [[id number]]  . dexamethasone (DECADRON) 2 MG tablet    Sig: Take 10 mg PO weekly    Dispense:  20 tablet    Refill:  3    Cycle #1    Order Specific Question:   Supervising Provider    Answer:   Brunetta Genera [[id number]]    THERAPY PLAN:  High risk medications are printed today and we will anticipate starting within the next 1-2 weeks.  We will reevaluate response in approximately 3 months.  All questions were answered. The patient knows to call the clinic with any problems, questions or concerns. We can certainly see the patient much sooner if necessary.  Patient and plan discussed with Dr. Irene Limbo and he is in agreement with the aforementioned.   This note is electronically signed by: Robynn Pane, PA-C 08/24/2017 5:00 PM

## 2017-08-27 ENCOUNTER — Telehealth (HOSPITAL_COMMUNITY): Payer: Self-pay | Admitting: Emergency Medicine

## 2017-08-27 NOTE — Telephone Encounter (Signed)
Clarification order given to [facility] on [facility] prescription.  Pt is to take 1 capsule on days 1, 8, 15 every 28 days.

## 2017-08-28 ENCOUNTER — Encounter (HOSPITAL_COMMUNITY): Payer: Self-pay | Admitting: Emergency Medicine

## 2017-08-28 MED ORDER — ONDANSETRON HCL 8 MG PO TABS: 8.0000 mg | ORAL_TABLET | Freq: Three times a day (TID) | ORAL | 2 refills | Status: DC | PRN

## 2017-08-28 NOTE — Progress Notes (Signed)
Chemotherapy teaching pulled together. 

## 2017-08-28 NOTE — Patient Instructions (Signed)
[place]   CHEMOTHERAPY INSTRUCTIONS  We are going to treat you with Ninlaro and Pomalyst for your multiple myeloma.  You will take Ninlaro: 1 capsule on day 1, 8, 15 every 28 days (weekly for 3 weeks with 1 week off).  You will take Pomalyst: days 1-21 (3 weeks in a row 1 week off) every 28 days. You have steroids (dexamethasone) 2 mg tablets.  You will take 5 tablets (10 mg) weekly. You will see the doctor and have lab work throughout treatment.    How Ixazomib (Ninlaro) Is Given: . Ixazomib is a capsule taken once a week for 3 out of 4 weeks (Days 1, 8, 15 of a 28 day cycle). . Ixazomib should be taken on the same day of the week and at the same time of day. . Ixazomib should be taken on an empty stomach (1 hour before or two hours after a meal). . The capsule should be swallowed whole with a glass of water. Do not open the capsule, crush, chew or dissolve it. . If a dose is delayed or missed, take it only if the next schedule dose is at least 72 hours away. Do not take a missed dose within 3 days of the next scheduled dose. Do not double a dose to make up for a missed dose. If vomiting occurs, do not repeat the dose; resume dosing at the next scheduled dose. The amount of ixazomib that you will receive depends on many factors, including your height and weight, your general health or other health problems (e.g. kidney, liver problems), and the type of cancer or condition you have. Your doctor will determine your exact dosage and schedule.    Side Effects: Important things to remember about the side effects of ixazomib: . Most people will not experience all of the ixazomib side effects listed. . Ixazomib side effects are often predictable in terms of their onset, duration, and severity. . Ixazomib side effects will improve after therapy is complete. . Ixazomib side effects may be quite manageable. There are many options to minimize or prevent the side  effects of ixazomib. The following side effects are common (occurring in greater than 30%) for patients taking ixazomib: . Low blood counts. Your white and red blood cells and platelets may temporarily decrease. This can put you at increased risk for infection, anemia and/or bleeding. o Nadir (low point): platelets are lowest between days 14-21 . Diarrhea . Constipation These are less common side effects (occurring in 10-29%) for patients receiving ixazomib: . Peripheral neuropathy (numbness/tingling of your hands/feet) . Nausea . Eye disease . Peripheral edema (swelling or your lower legs/ankles) . Vomiting . Skin Rash . Upper respiratory infection Not all side effects are listed above. Side effects that are very rare -- occurring in less than about 10 percent of patients -- are not listed here. But you should always inform your health care provider if you experience any unusual symptoms. When to contact your doctor or health care provider: Contact your health care provider immediately, day or night, if you should experience any of the following symptoms: . Fever of 100.5 F (38 C) or higher, chills (possible signs of infection) . Unusual bleeding or bruising . Black or tarry stools, or blood in your stools . Blood in the urine The following symptoms require medical attention, but are not an emergency. Contact your health care provider within 24 hours of noticing any of the following: . Nausea (interferes with  ability to eat and unrelieved with prescribed medication) . Vomiting (vomiting more than 4-5 times in a 24 hour period) . Diarrhea (4-6 episodes in a 24-hour period) . Constipation unrelieved by laxative use. Marland Kitchen Extreme fatigue (unable to carry on self-care activities) . Mouth sores (painful redness, swelling and ulcers) . Yellowing of the skin or eyes . Swelling of the feet or ankles. Sudden weight gain. . Signs of infection such as redness or swelling, pain on swallowing,  coughing up mucous, or painful urination. . Unable to eat or drink for 24 hours or have signs of dehydration: tiredness, thirst, dry mouth, dark and decrease amount of urine, or dizziness. Always inform your health care provider if you experience any unusual symptoms.  Precautions: . Before starting ixazomib treatment, make sure you tell your doctor about any other medications you are taking (including prescription, over the counter, vitamins, herbal remedies, etc.) There may be serious drug interactions. . Do not take aspirin, products containing aspirin unless your doctor specifically permits this. . Do not take Onset while you are on this therapy. . Do not receive any kind of immunization or vaccination without your doctor's approval while taking ixazomib. . Inform your health care professional if you are pregnant or may be pregnant prior to starting this treatment. Pregnancy category X. Ixazomib may cause fetal harm when given to a pregnant woman. This drug must not be given to a pregnant woman or a woman who intends to become pregnant. If a woman becomes pregnant while taking ixazomib, the medication must be stopped immediately and the woman given appropriate counseling). . For both men and women: Use contraceptives, and do not conceive a child (get pregnant) while taking ixazomib. Barrier methods of contraception, such as condoms, are recommended for up to 3 months after last dose of ixazomib. . Do not breast feed while taking ixazomib.  Self-Care Tips: . Drink at least two to three quarts of fluid every 24 hours, unless you are instructed otherwise. . You may be at risk of infection so try to avoid crowds or people with colds, and report fever or any other signs of infection immediately to your health care provider. Wendee Copp your hands often. . To help treat/prevent mouth sores, use a soft toothbrush, and if you should have mouth sores/discomfort rinse three times a day with 1  teaspoon of baking soda with 8 ounces of water. . Use an electric razor and a soft toothbrush to minimize bleeding. . Avoid contact sports or activities that could cause injury. . To reduce nausea, take anti-nausea medication as prescribed by your doctor, and eat small, frequent meals. . Follow regimen of anti-diarrhea medication as prescribed by your health care professional. . Eat foods that may help reduce diarrhea (see managing side effects - diarrhea). Marland Kitchen Keep your bowels moving. Your health care provider may prescribe a stool softener to help prevent constipation that may be caused by this medicine. . Avoid sun exposure. Wear SPF 1 (or higher) sunblock and protective clothing. . In general, drinking alcoholic beverages should be kept to a minimum or avoided completely. You should discuss this with your doctor. . Get plenty of rest. . Maintain good nutrition. . Remain active as you are able. Gentle exercise is encouraged such as a daily walk. . If you experience symptoms or side effects, be sure to discuss them with your health care team. They can prescribe medications and/or offer other suggestions that are effective in managing such problems.  How Pomalyst  Is Given: . As a capsule by mouth. Capsules should be stored in a cool, dry place and protected from light. . Swallow capsules whole with water 1 time per day, at about the same time. . Do not break, chew or open the capsules. Do not open the Pomalyst capsules or handle them any more than needed. If you touch a broken capsule or the medicine in the capsule, wash your hands right away with soap or water. . Pomalyst can be taken with or without food. . If you miss a dose of Pomalyst, and it has been less than 12 hours since your regular time, take it as soon as you remember. If it has been more than 12 hours, skip your next dose. Do not take 2 doses at the same time. . In order to receive this drug, there are strict guidelines that you  must follow. You will be required to participate in a special program called the Risk Evaluation and Mitigation Strategy (REMS) Program. You will be asked to fill out a questionnaire before you receive the medication, and every month, while you are taking the drug. Only certain pharmacists and doctors may prescribe or dispense this medication. Patients must sign a Patient-Prescriber agreement and comply with the REMS requirements. . Do not share Pomalyst with others. . You should not smoke cigarettes while taking Pomalyst. Smoking cigarettes during treatment may effect how well Pomalyst works. The amount of Pomalyst you will receive depends on many factors, including your general health or other health problems, and the type of cancer or condition being treated. Your doctor will determine your dosage and schedule.   Side Effects: Important things to remember about the side effects of Pomalyst:  . Most people do not experience all of the side effects listed. . Side effects are often predictable in terms of their onset and duration. . Side effects are almost always reversible and will go away after treatment is complete. . There are many options to help minimize or prevent side effects There is no relationship between the presence or severity of side effects and the effectiveness of the medication.   The following side effects are common (occurring in greater than 30%) for patients taking Pomalyst:  . Severe life-threatening human birth defects if taken during pregnancy (see precautions). . Fatigue  . Weakness  . Low white blood cell count  . Anemia  . Constipation  . Nausea  . Diarrhea  . Shortness of breath  . Upper respiratory infections  . Back pain  . Fever  These side effects are less common side effects (occurring in about 10-29%) of patients receiving Pomalyst :  . Neuropathy (numbness and tingling) . Dizziness . Confusion A serious but rare side effect of Pomalyst is  blood clots forming in the legs or lung. Call your provider right way if you experience shortness of breath, chest pain or arm or leg swelling.  Pomalyst is not to be taken during pregnancy. This type of medication has can cause severe life-threatening birth defects.  Not all side effects are listed above. Some that are rare (occurring in less than 10% of patients) are not listed here. However, you should always inform your health care provider if you experience any unusual symptoms.  When to contact your doctor or health care provider: Contact your health care provider immediately, day or night, if you should experience any of the following symptoms:  . Fever of 100.5 F (38 C) or higher, chills (possible signs of infection). Marland Kitchen  Wheezing, difficulty breathing, closing up of the throat, swelling of facial features, hives (possible allergic reaction). . Shortness of breath, chest pain or arm or leg swelling (possible blood clot).  The following symptoms require medical attention, but are not an emergency. Contact your health care provider within 24 hours of noticing any of the following:  . Extreme fatigue (unable to carry on self-care activities) . Constipation unrelieved by laxative use . New skin rashes . Numbness or tingling of your hands or feet . Swelling of the feet or ankles. Sudden weight gain . Signs of infection such as redness or swelling, pain on swallowing, coughing up mucous, or painful urination. . Unable to eat or drink for 24 hours or have signs of dehydration: tiredness, thirst, dry mouth, dark and decrease amount of urine, or dizziness. . Failure in contraception. Always inform your health care provider if you experience any unusual symptoms.   Precautions: . Before starting pomalyst treatment, make sure you tell your doctor about any other medications you are taking (including prescription, over-the-counter, vitamins, herbal remedies, etc.). Do not take aspirin, or products  containing aspirin unless your doctor specifically permits this. . Do not take other medications that may cause drowsiness without first consulting your health care provider. Also avoid alcohol. . For both men and women: Use contraceptives, and do not conceive a child (get pregnant) while taking pomalyst. Severe, life-threatening birth defects may result. Two methods of contraception, such as latex condoms and spermicides, are required. . For women of childbearing potential. You must use contraception 4 weeks before you can begin pomalyst. The manufacturer requires you to take a pregnancy test every month before you start a new prescription of pomalyst. This is to ensure that you or your significant other is not pregnant. Discuss with your doctor when you may safely become pregnant or conceive a child after therapy. Females must agree to use 2 different forms of effective birth control at the same time, for at least 4 weeks before, while taking, during any breaks (interruptions) in your treatment, and for at least 4 weeks after stopping pomalyst. . Males, including those who have had a vasectomy, must use a latex or synthetic condom during any sexual contact with a pregnant female or a female that can become pregnant while taking pomalyst, during any breaks (interruptions) in your treatment with pomalyst, and for 4 weeks after stopping pomalyst. . Do not donate sperm while taking pomalyst, during any breaks (interruptions) in your treatment, and for 4 weeks after stopping. . Pregnancy category X (pomalyst may cause fetal harm when given to a pregnant woman. This drug must not be given to a pregnant woman or a woman who intends to become pregnant. If a woman becomes pregnant while taking thalidomide, the medication must be stopped immediately and the woman given appropriate counseling). . Do not breast feed while taking this medication.  Self-Care Tips: . Take this medication in the evening before bedtime.  This may help to minimize daytime drowsiness. . You may experience drowsiness or dizziness; avoid driving or engaging in tasks that require alertness until your response to the drug is known. Marland Kitchen Keep your bowels moving. Your health care provider may prescribe a stool softener to help prevent constipation that may be caused by this medicine. . Drink 2 to 3 quarts of fluid every 24 hours, unless you were told to restrict your fluid intake, and maintain good nutrition. This will decrease your chances of being constipated, and prevent dehydration. . You may  be at risk of infection report fever or any other signs of infection immediately to your health care provider. Wendee Copp your hands often. Marland Kitchen Avoid sun exposure. Wear SPF 15 (or higher) sun block and protective clothing. . In general, drinking alcoholic beverages should be kept to a minimum or avoided completely. You should discuss this with your doctor. . Do not donate blood while you are taking this medication. . Don't share your pills with anyone! . Get plenty of rest. . Maintain good nutrition. . If you experience symptoms or side effects, be sure to discuss them with your health care team. They can prescribe medications and/or offer other suggestions that are effective in managing such problems.    SELF CARE ACTIVITIES WHILE ON CHEMOTHERAPY: Hydration Increase your fluid intake 48 hours prior to treatment and drink at least 8 to 12 cups (64 ounces) of water/decaff beverages per day after treatment. You can still have your cup of coffee or soda but these beverages do not count as part of your 8 to 12 cups that you need to drink daily. No alcohol intake.  Medications Continue taking your normal prescription medication as prescribed.  If you start any new herbal or new supplements please let us know first to make sure it is safe.  Mouth Care Have teeth cleaned professionally before starting treatment. Keep dentures and partial plates clean. Use  soft toothbrush and do not use mouthwashes that contain alcohol. Biotene is a good mouthwash that is available at most pharmacies or may be ordered by calling [phone]. Use warm salt water gargles (1 teaspoon salt per 1 quart warm water) before and after meals and at bedtime. Or you may rinse with 2 tablespoons of three-percent hydrogen peroxide mixed in eight ounces of water. If you are still having problems with your mouth or sores in your mouth please call the clinic. If you need dental work, please let the doctor know before you go for your appointment so that we can coordinate the best possible time for you in regards to your chemo regimen. You need to also let your dentist know that you are actively taking chemo. We may need to do labs prior to your dental appointment.    Skin Care Always use sunscreen that has not expired and with SPF (Sun Protection Factor) of 50 or higher. Wear hats to protect your head from the sun. Remember to use sunscreen on your hands, ears, face, & feet.  Use good moisturizing lotions such as udder cream, eucerin, or even Vaseline. Some chemotherapies can cause dry skin, color changes in your skin and nails.    . Avoid long, hot showers or baths. . Use gentle, fragrance-free soaps and laundry detergent. . Use moisturizers, preferably creams or ointments rather than lotions because the thicker consistency is better at preventing skin dehydration. Apply the cream or ointment within 15 minutes of showering. Reapply moisturizer at night, and moisturize your hands every time after you wash them.  Hair Loss (if your doctor says your hair will fall out)  . If your doctor says that your hair is likely to fall out, decide before you begin chemo whether you want to wear a wig. You may want to shop before treatment to match your hair color. . Hats, turbans, and scarves can also camouflage hair loss, although some people prefer to leave their heads uncovered. If you go  bare-headed outdoors, be sure to use sunscreen on your scalp. . Cut your hair short. It eases  the inconvenience of shedding lots of hair, but it also can reduce the emotional impact of watching your hair fall out. . Don't perm or color your hair during chemotherapy. Those chemical treatments are already damaging to hair and can enhance hair loss. Once your chemo treatments are done and your hair has grown back, it's OK to resume dyeing or perming hair. With chemotherapy, hair loss is almost always temporary. But when it grows back, it may be a different color or texture. In older adults who still had hair color before chemotherapy, the new growth may be completely gray.  Often, new hair is very fine and soft.      Infection Prevention Please wash your hands for at least 30 seconds using warm soapy water. Handwashing is the #1 way to prevent the spread of germs. Stay away from sick people or people who are getting over a cold. If you develop respiratory systems such as green/yellow mucus production or productive cough or persistent cough let us know and we will see if you need an antibiotic. It is a good idea to keep a pair of gloves on when going into grocery stores/[employer] to decrease your risk of coming into contact with germs on the carts, etc. Carry alcohol hand gel with you at all times and use it frequently if out in public. If your temperature reaches 100.5 or higher please call the clinic and let us know.  If it is after hours or on the weekend please go to the ER if your temperature is over 100.5.  Please have your own personal thermometer at home to use.    Sex and bodily fluids If you are going to have sex, a condom must be used to protect the person that isn't taking chemotherapy. Chemo can decrease your libido (sex drive). For a few days after chemotherapy, chemotherapy can be excreted through your bodily fluids.  When using the toilet please close the lid and flush the toilet twice.  Do  this for a few day after you have had chemotherapy.   Effects of chemotherapy on your sex life Some changes are simple and won't last long. They won't affect your sex life permanently. Sometimes you may feel: . too tired . not strong enough to be very active . sick or sore  . not in the mood . anxious or low Your anxiety might not seem related to sex. For example, you may be worried about the cancer and how your treatment is going. Or you may be worried about money, or about how you family are coping with your illness. These things can cause stress, which can affect your interest in sex. It's important to talk to your partner about how you feel. Remember - the changes to your sex life don't usually last long. There's usually no medical reason to stop having sex during chemo. The drugs won't have any long term physical effects on your performance or enjoyment of sex. Cancer can't be passed on to your partner during sex  Contraception It's important to use reliable contraception during treatment. Avoid getting pregnant while you or your partner are having chemotherapy. This is because the drugs may harm the baby. Sometimes chemotherapy drugs can leave a man or woman infertile.  This means you would not be able to have children in the future. You might want to talk to someone about permanent infertility. It can be very difficult to learn that you may no longer be able to have children. Some people  find counselling helpful. There might be ways to preserve your fertility, although this is easier for men than for women. You may want to speak to a fertility expert. You can talk about sperm banking or harvesting your eggs. You can also ask about other fertility options, such as donor eggs. If you have or have had breast cancer, your doctor might advise you not to take the contraceptive pill. This is because the hormones in it might affect the cancer.  It is not known for sure whether or not chemotherapy  drugs can be passed on through semen or secretions from the vagina. Because of this some doctors advise people to use a barrier method if you have sex during treatment. This applies to vaginal, anal or oral sex. Generally, doctors advise a barrier method only for the time you are actually having the treatment and for about a week after your treatment. Advice like this can be worrying, but this does not mean that you have to avoid being intimate with your partner. You can still have close contact with your partner and continue to enjoy sex.  Animals If you have cats or birds we just ask that you not change the litter or change the cage.  Please have someone else do this for you while you are on chemotherapy.   Food Safety During and After Cancer Treatment Food safety is important for people both during and after cancer treatment. Cancer and cancer treatments, such as chemotherapy, radiation therapy, and stem cell/bone marrow transplantation, often weaken the immune system. This makes it harder for your body to protect itself from foodborne illness, also called food poisoning. Foodborne illness is caused by eating food that contains harmful bacteria, parasites, or viruses.  Foods to avoid Some foods have a higher risk of becoming tainted with bacteria. These include: Marland Kitchen Unwashed fresh fruit and vegetables, especially leafy vegetables that can hide dirt and other contaminants . Raw sprouts, such as alfalfa sprouts  Raw or undercooked beef, especially ground beef, or other raw or undercooked meat and poultry . Fatty, fried, or spicy foods immediately before or after treatment.  These can sit heavy on your stomach and make you feel nauseous. . Raw or undercooked shellfish, such as oysters. . Sushi and sashimi, which often contain raw fish.  . Unpasteurized beverages, such as unpasteurized fruit juices, raw milk, raw yogurt, or cider . Undercooked eggs, such as soft boiled, over easy, and poached; raw,  unpasteurized eggs; or foods made with raw egg, such as homemade raw cookie dough and homemade mayonnaise Simple steps for food safety Shop smart. . Do not buy food stored or displayed in an unclean area. . Do not buy bruised or damaged fruits or vegetables. . Do not buy cans that have cracks, dents, or bulges. . Pick up foods that can spoil at the end of your shopping trip and store them in a cooler on the way home. Prepare and clean up foods carefully. . Rinse all fresh fruits and vegetables under running water, and dry them with a clean towel or paper towel. . Clean the top of cans before opening them. . After preparing food, wash your hands for 20 seconds with hot water and soap. Pay special attention to areas between fingers and under nails. . Clean your utensils and dishes with hot water and soap. Marland Kitchen Disinfect your kitchen and cutting boards using 1 teaspoon of liquid, unscented bleach mixed into 1 quart of water.   Dispose of old food. Marland Kitchen  Eat canned and packaged food before its expiration date (the "use by" or "best before" date). . Consume refrigerated leftovers within 3 to 4 days. After that time, throw out the food. Even if the food does not smell or look spoiled, it still may be unsafe. Some bacteria, such as Listeria, can grow even on foods stored in the refrigerator if they are kept for too long. Take precautions when eating out. . At restaurants, avoid buffets and salad bars where food sits out for a long time and comes in contact with many people. Food can become contaminated when someone with a virus, often a norovirus, or another "bug" handles it. . Put any leftover food in a "to-go" container yourself, rather than having the server do it. And, refrigerate leftovers as soon as you get home. . Choose restaurants that are clean and that are willing to prepare your food as you order it cooked.   Over-the-Counter Meds:  Miralax 1 capful in 8 oz of fluid daily. May increase to two  times a day if needed. This is a stool softener. If this doesn't work proceed you can add:  Senokot S-start with 1 tablet two times a day and increase to 4 tablets two times a day if needed. (total of 8 tablets in a 24 hour period). This is a stimulant laxative.   Call us if this does not help your bowels move.   Imodium '2mg'$  capsule. Take 2 capsules after the 1st loose stool and then 1 capsule every 2 hours until you go a total of 12 hours without having a loose stool. Call the [facility] if loose stools continue. If diarrhea occurs @ bedtime, take 2 capsules @ bedtime. Then take 2 capsules every 4 hours until morning. Call [facility].    Diarrhea Sheet  If you are having loose stools/diarrhea, please purchase Imodium and begin taking as outlined:  At the first sign of poorly formed or loose stools you should begin taking Imodium(loperamide) 2 mg capsules.  Take two caplets ('4mg'$ ) followed by one caplet ('2mg'$ ) every 2 hours until you have had no diarrhea for 12 hours.  During the night take two caplets ('4mg'$ ) at bedtime and continue every 4 hours during the night until the morning.  Stop taking Imodium only after there is no sign of diarrhea for 12 hours.    Always call the [facility] if you are having loose stools/diarrhea that you can't get under control.  Loose stools/disrrhea leads to dehydration (loss of water) in your body.  We have other options of trying to get the loose stools/diarrhea to stopped but you must let us know!   Constipation Sheet *Miralax in 8 oz of fluid daily.  May increase to two times a day if needed.  This is a stool softener.  If this not enough to keep your bowel regular:  You can add:  *Senokot S, start with one tablet twice a day and can increase to 4 tablets twice a day if needed.  This is a stimulant laxative.  Sometimes when you take pain medication you need BOTH a medicine to keep your stool soft and a medicine to help your bowel push it out! Please  call if the above does not work for you.   Do not go more than 2 days without a bowel movement.  It is very important that you do not become constipated.  It will make you feel sick to your stomach (nausea) and can cause abdominal pain  and vomiting.    SYMPTOMS TO REPORT AS SOON AS POSSIBLE AFTER TREATMENT:  FEVER GREATER THAN 100.5 F  CHILLS WITH OR WITHOUT FEVER  NAUSEA AND VOMITING THAT IS NOT CONTROLLED WITH YOUR NAUSEA MEDICATION  UNUSUAL SHORTNESS OF BREATH  UNUSUAL BRUISING OR BLEEDING  TENDERNESS IN MOUTH AND THROAT WITH OR WITHOUT PRESENCE OF ULCERS  URINARY PROBLEMS  BOWEL PROBLEMS  UNUSUAL RASH    Wear comfortable clothing and clothing appropriate for easy access to any Portacath or PICC line. Let us know if there is anything that we can do to make your therapy better!    What to do if you need assistance after hours or on the weekends: CALL [phone].  HOLD on the line, do not hang up.  You will hear multiple messages but at the end you will be connected with a nurse triage line.  They will contact the doctor if necessary.  Most of the time they will be able to assist you.  Do not call the hospital operator.     I have been informed and understand all of the instructions given to me and have received a copy. I have been instructed to call the clinic [phone] or my family physician as soon as possible for continued medical care, if indicated. I do not have any more questions at this time but understand that I may call the [facility] or the Patient Navigator at [phone] during office hours should I have questions or need assistance in obtaining follow-up care.

## 2017-08-30 ENCOUNTER — Inpatient Hospital Stay (HOSPITAL_COMMUNITY): Payer: Medicare Other

## 2017-08-30 DIAGNOSIS — C9000 Multiple myeloma not having achieved remission: Secondary | ICD-10-CM

## 2017-08-30 DIAGNOSIS — D649 Anemia, unspecified: Secondary | ICD-10-CM

## 2017-08-30 NOTE — Progress Notes (Signed)
Chemotherapy teaching completed.  Consent signed.  Extensive teaching packet given.   

## 2017-09-03 ENCOUNTER — Telehealth (HOSPITAL_COMMUNITY): Payer: Self-pay | Admitting: Emergency Medicine

## 2017-09-03 NOTE — Telephone Encounter (Signed)
Pt called today to say she started taking her ninlaro and pomalyst.  She will call if she has any problems. She understands how to take her medications.  She explained to me how to take her Ninlaro, Pomalyst, and dexamethasone.  She has her calenders to fill out to help her stay on track with her medications.

## 2017-09-20 ENCOUNTER — Other Ambulatory Visit (HOSPITAL_COMMUNITY): Payer: Self-pay | Admitting: Oncology

## 2017-09-21 ENCOUNTER — Other Ambulatory Visit (HOSPITAL_COMMUNITY): Payer: Self-pay

## 2017-09-21 ENCOUNTER — Other Ambulatory Visit (HOSPITAL_COMMUNITY): Payer: Medicare Other

## 2017-09-21 ENCOUNTER — Inpatient Hospital Stay (HOSPITAL_COMMUNITY): Payer: Medicare Other | Attending: Internal Medicine

## 2017-09-21 ENCOUNTER — Inpatient Hospital Stay (HOSPITAL_BASED_OUTPATIENT_CLINIC_OR_DEPARTMENT_OTHER): Payer: Medicare Other | Admitting: Hematology

## 2017-09-21 ENCOUNTER — Other Ambulatory Visit: Payer: Self-pay

## 2017-09-21 ENCOUNTER — Inpatient Hospital Stay (HOSPITAL_COMMUNITY): Payer: Medicare Other

## 2017-09-21 ENCOUNTER — Ambulatory Visit (HOSPITAL_COMMUNITY): Payer: Medicare Other

## 2017-09-21 ENCOUNTER — Encounter (HOSPITAL_COMMUNITY): Payer: Self-pay | Admitting: Hematology

## 2017-09-21 DIAGNOSIS — D801 Nonfamilial hypogammaglobulinemia: Secondary | ICD-10-CM | POA: Diagnosis not present

## 2017-09-21 DIAGNOSIS — E785 Hyperlipidemia, unspecified: Secondary | ICD-10-CM

## 2017-09-21 DIAGNOSIS — N183 Chronic kidney disease, stage 3 (moderate): Secondary | ICD-10-CM

## 2017-09-21 DIAGNOSIS — E538 Deficiency of other specified B group vitamins: Secondary | ICD-10-CM | POA: Insufficient documentation

## 2017-09-21 DIAGNOSIS — G620 Drug-induced polyneuropathy: Secondary | ICD-10-CM | POA: Diagnosis not present

## 2017-09-21 DIAGNOSIS — J449 Chronic obstructive pulmonary disease, unspecified: Secondary | ICD-10-CM | POA: Insufficient documentation

## 2017-09-21 DIAGNOSIS — C9001 Multiple myeloma in remission: Secondary | ICD-10-CM | POA: Diagnosis present

## 2017-09-21 DIAGNOSIS — R5383 Other fatigue: Secondary | ICD-10-CM | POA: Insufficient documentation

## 2017-09-21 DIAGNOSIS — Z87891 Personal history of nicotine dependence: Secondary | ICD-10-CM

## 2017-09-21 DIAGNOSIS — T451X5S Adverse effect of antineoplastic and immunosuppressive drugs, sequela: Secondary | ICD-10-CM | POA: Insufficient documentation

## 2017-09-21 DIAGNOSIS — Z9221 Personal history of antineoplastic chemotherapy: Secondary | ICD-10-CM | POA: Insufficient documentation

## 2017-09-21 DIAGNOSIS — C9000 Multiple myeloma not having achieved remission: Secondary | ICD-10-CM

## 2017-09-21 DIAGNOSIS — I130 Hypertensive heart and chronic kidney disease with heart failure and stage 1 through stage 4 chronic kidney disease, or unspecified chronic kidney disease: Secondary | ICD-10-CM

## 2017-09-21 DIAGNOSIS — D631 Anemia in chronic kidney disease: Secondary | ICD-10-CM | POA: Diagnosis not present

## 2017-09-21 DIAGNOSIS — Z79899 Other long term (current) drug therapy: Secondary | ICD-10-CM | POA: Insufficient documentation

## 2017-09-21 LAB — COMPREHENSIVE METABOLIC PANEL
ALK PHOS: 82 U/L (ref 38–126)
ALT: 23 U/L (ref 14–54)
ANION GAP: 11 (ref 5–15)
AST: 20 U/L (ref 15–41)
Albumin: 3 g/dL — ABNORMAL LOW (ref 3.5–5.0)
BILIRUBIN TOTAL: 0.5 mg/dL (ref 0.3–1.2)
BUN: 21 mg/dL — ABNORMAL HIGH (ref 6–20)
CO2: 25 mmol/L (ref 22–32)
CREATININE: 1.09 mg/dL — AB (ref 0.44–1.00)
Calcium: 8.5 mg/dL — ABNORMAL LOW (ref 8.9–10.3)
Chloride: 106 mmol/L (ref 101–111)
GFR calc non Af Amer: 48 mL/min — ABNORMAL LOW (ref >60)
GFR, EST AFRICAN AMERICAN: 56 mL/min — AB (ref >60)
GLUCOSE: 125 mg/dL — AB (ref 65–99)
Potassium: 3.8 mmol/L (ref 3.5–5.1)
Sodium: 142 mmol/L (ref 135–145)
TOTAL PROTEIN: 6.2 g/dL — AB (ref 6.5–8.1)

## 2017-09-21 LAB — CBC WITH DIFFERENTIAL/PLATELET
Basophils Absolute: 0 10*3/uL (ref 0.0–0.1)
Basophils Relative: 0 %
Eosinophils Absolute: 0.3 10*3/uL (ref 0.0–0.7)
Eosinophils Relative: 4 %
HEMATOCRIT: 33.9 % — AB (ref 36.0–46.0)
HEMOGLOBIN: 10.5 g/dL — AB (ref 12.0–15.0)
LYMPHS ABS: 0.9 10*3/uL (ref 0.7–4.0)
LYMPHS PCT: 13 %
MCH: 28.5 pg (ref 26.0–34.0)
MCHC: 31 g/dL (ref 30.0–36.0)
MCV: 92.1 fL (ref 78.0–100.0)
MONOS PCT: 22 %
Monocytes Absolute: 1.7 10*3/uL — ABNORMAL HIGH (ref 0.1–1.0)
NEUTROS ABS: 4.6 10*3/uL (ref 1.7–7.7)
NEUTROS PCT: 61 %
Platelets: 203 10*3/uL (ref 150–400)
RBC: 3.68 MIL/uL — ABNORMAL LOW (ref 3.87–5.11)
RDW: 20.6 % — ABNORMAL HIGH (ref 11.5–15.5)
WBC: 7.5 10*3/uL (ref 4.0–10.5)

## 2017-09-21 LAB — LACTATE DEHYDROGENASE: LDH: 129 U/L (ref 98–192)

## 2017-09-21 MED ORDER — CYANOCOBALAMIN 1000 MCG/ML IJ SOLN
1000.0000 ug | Freq: Once | INTRAMUSCULAR | Status: AC
  Administered 2017-09-21: 1000 ug via INTRAMUSCULAR

## 2017-09-21 MED ORDER — POMALIDOMIDE 2 MG PO CAPS: 2.0000 mg | ORAL_CAPSULE | Freq: Every day | ORAL | 0 refills | Status: DC

## 2017-09-21 MED ORDER — CYANOCOBALAMIN 1000 MCG/ML IJ SOLN
INTRAMUSCULAR | Status: AC
  Filled 2017-09-21: qty 1

## 2017-09-21 NOTE — Assessment & Plan Note (Signed)
1. IgG kappa multiple myeloma-started first cycle of Pomalyst 2 mg (days 1-21 every 28 days), ixazomib 2.3 mg (days 1, 8, 15 every 29 days), and Dexamethasone 10 mg (weekly) on 08/24/2017 due to chemical relapse.  She has tolerated it reasonably well although she felt somewhat tired.  Steroid tablets kept her jittery and awake for 2 days.  I have told her to take 5 mg on one day and 5 mg the following day.  We plan to recheck her blood counts prior to her next cycle on 10/01/2017.  I will see her back in 5 weeks prior to start of her cycle 3.  Labs from today showed normal white count with hemoglobin dropping to 10.5.  Platelet count was within normal limits.  2.  Bone protection: She is receiving zoledronic acid once every 3 months.  Her next dose will be on 10/29/2017.  3.  Peripheral neuropathy: She has numbness in the feet which has been stable.  This is from prior Velcade exposure.

## 2017-09-21 NOTE — Telephone Encounter (Signed)
Received refill request from patients pharmacy for Pomalyst. Reviewed with provider, chart checked and refilled.

## 2017-09-21 NOTE — Progress Notes (Signed)
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       03/18/2015 - 07/28/2015 Chemotherapy    Velcade 1.6 mg/m2 discontinued secondary to intolerance      07/28/2015 Adverse Reaction    stool incontinence, weakness, collapse upon standing, felt to be secondary to velcade      11/09/2015 - 12/13/2015 Chemotherapy    cytoxan IV 300 mg/m2 administered X 2 doses only in addition to rev/dex      11/09/2015 - 06/08/2016 Chemotherapy    Revlimid/Dexamethasone       02/05/2017 - 07/09/2017 Chemotherapy    The patient had bortezomib SQ (VELCADE) chemo injection 1.75 mg, 1 mg/m2 = 1.75 mg (66.7 % of original dose 1.5 mg/m2), Subcutaneous,  Once, 1 of 8 cycles Dose modification: 1.5 mg/m2 (original dose 1.5 mg/m2, Cycle 1, Reason: Provider Judgment, Comment: Per Dr. Norma Fredrickson recommendations from [facility] 1 mg/m2 (original dose 1.5 mg/m2, Cycle 1, Reason: Provider Judgment)  for chemotherapy treatment.        07/09/2017 Adverse Reaction    Diarrhea       Chemotherapy    Pomalyst 2 mg (Days 1-21 every 28 days), Ixazomib 2.3 mg (days 1, 8, 15 every 28 days), and Dexamethasone 10 mg (weekly)- Rx's printed on 08/24/2017.  Treatment recommendations from Dr. Norma Fredrickson at [facility].        CANCER STAGING: Cancer Staging No matching staging information was found for the patient.   INTERVAL HISTORY:  Ms. Kyer 77 y.o. female returns for follow-up of multiple myeloma.  She has been tolerating  pomalidomide and Ninlaro very well.  She developed some jitteriness and lack of sleep after taking dexamethasone.  She is taking 5 tablets of 2 mg on once a week.    REVIEW OF SYSTEMS:  Review of Systems  Constitutional: Positive for fatigue.       Jittery  HENT:  Negative.   Respiratory: Negative.   Cardiovascular: Negative.   Gastrointestinal: Negative.   Genitourinary: Negative.    Musculoskeletal: Negative.   Skin: Negative.   Neurological: Positive for numbness (feet).  Psychiatric/Behavioral: Negative.      PAST MEDICAL/SURGICAL HISTORY:  Past Medical History:  Diagnosis Date  . 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 CAD (coronary artery disease)   . COPD (chronic obstructive pulmonary disease) ([facility])   . History of tobacco abuse   . Hyperlipidemia   . Hypertension   . 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 Multiple myeloma ([facility])   . Vitamin B12 deficiency 04/15/2016   Overview:  Vitamin B12 level documented 155, January 2016 with the normal range being 211-924   Past Surgical History:  Procedure Laterality Date  . BACK SURGERY    . CARDIAC CATHETERIZATION  04/2011   right and left cath showing normal right heart pressures,but newly diagnosed coronary artery disease/drug eluting stent placed to RCA with residual disease in the proximal RCA and LAD and ramus, normal LV function and 60-65% EF  . COLONOSCOPY N/A 09/30/2014  Procedure: COLONOSCOPY;  Surgeon: Rogene Houston, MD;  Location: [facility];  Service: Endoscopy;  Laterality: N/A;  225  . CORONARY ANGIOPLASTY WITH STENT PLACEMENT  04/2011   mid RCA: 3.0 X38 mm Promus DES. Residual 40% disease proximally  . NECK SURGERY       SOCIAL HISTORY:  Social History   Socioeconomic History  . Marital status: Widowed    Spouse name: Not on file  . Number of children: Not on file  . Years of education: Not on file  . Highest education level: Not on file  Social Needs  .  Financial resource strain: Not on file  . Food insecurity - worry: Not on file  . Food insecurity - inability: Not on file  . Transportation needs - medical: Not on file  . Transportation needs - non-medical: Not on file  Occupational History  . 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 Smoking status: Former Smoker    Packs/day: 3.00    Years: 25.00    Pack years: 75.00    Types: Cigarettes    Last attempt to quit: 07/10/1994    Years since quitting: 23.2  . Smokeless tobacco: Never Used  Substance and Sexual Activity  . Alcohol use: No  . Drug use: Not on file  . Sexual activity: Not on file  Other Topics Concern  . Not on file  Social History Narrative  . Not on file    FAMILY HISTORY:  Family History  Problem Relation Age of Onset  . Heart failure Mother   . Cancer Father     CURRENT MEDICATIONS:  Outpatient Encounter Medications as of 09/21/2017  Medication Sig  . acyclovir (ZOVIRAX) 200 MG capsule TAKE ONE CAPSULE BY MOUTH TWICE DAILY  . aspirin EC 81 MG tablet Take 1 tablet (81 mg total) by mouth daily.  Marland Kitchen atorvastatin (LIPITOR) 10 MG tablet TAKE ONE TABLET BY MOUTH DAILY  . Calcium Carbonate-Vit D-Min (CALCIUM 600+D PLUS MINERALS) 600-400 MG-UNIT CHEW Chew 1 tablet 2 (two) times daily by mouth.   . cyanocobalamin (,VITAMIN B-12,) 1000 MCG/ML injection Inject 1 mL (1,000 mcg total) into the muscle every 30 (thirty) days.  Marland Kitchen dexamethasone (DECADRON) 2 MG tablet Take 10 mg PO weekly  . diltiazem (CARDIZEM CD) 240 MG 24 hr capsule TAKE ONE CAPSULE BY MOUTH DAILY  . guaiFENesin (MUCINEX) 600 MG 12 hr tablet Take 600 mg 2 (two) times daily as needed by mouth for cough or to loosen phlegm.  . ixazomib citrate (NINLARO) 2.3 MG capsule Take 1 capsule (2.3 mg total) by mouth once a week.  . losartan (COZAAR) 50 MG tablet Take 1 tablet (50 mg total) by mouth daily. (Patient taking differently: Take 50 mg 2 (two) times daily by mouth. )  . magnesium  oxide (MAG-OX) 400 (241.3 Mg) MG tablet TAKE ONE TABLET BY MOUTH TWICE DAILY. WHEN TAKING LASIX (Patient taking differently: TAKE ONE TABLET BY MOUTH DAILY)  . metoprolol tartrate (LOPRESSOR) 25 MG tablet TAKE ONE TABLET BY MOUTH TWICE DAILY  . ondansetron (ZOFRAN) 8 MG tablet Take 1 tablet (8 mg total) by mouth every 8 (eight) hours as needed for nausea or vomiting.  . pomalidomide (POMALYST) 2 MG capsule Take 1 capsule (2 mg total) by mouth daily. Take with water on days 1-21. Repeat every 28 days.  . potassium chloride (K-DUR,KLOR-CON) 10 MEQ tablet Take 10 mEq by mouth once.    Facility-Administered Encounter Medications as of  09/21/2017  Medication  . cyanocobalamin ((VITAMIN B-12)) injection 1,000 mcg  . [COMPLETED] cyanocobalamin ((VITAMIN B-12)) injection 1,000 mcg    ALLERGIES:  Allergies  Allergen Reactions  . Lisinopril Cough  . Plavix [Clopidogrel Bisulfate] Swelling     PHYSICAL EXAM:  ECOG Performance status: 1  Vitals:   09/21/17 1601  BP: (!) 115/50  Pulse: 77  Resp: 16  Temp: 98.6 F (37 C)  SpO2: 95%   Filed Weights   09/21/17 1601  Weight: 154 lb (69.9 kg)    Physical Exam  Constitutional: She is well-developed, well-nourished, and in no distress.  HENT:  Head: Normocephalic.  Neck: Normal range of motion. Neck supple.  Cardiovascular: Normal rate and regular rhythm.  Pulmonary/Chest: Effort normal and breath sounds normal.  Abdominal: Soft.  Musculoskeletal: Normal range of motion.  Psychiatric: Memory, affect and judgment normal.     LABORATORY DATA:  I have reviewed the labs as listed.  CBC    Component Value Date/Time   WBC 7.5 09/21/2017 1426   RBC 3.68 (L) 09/21/2017 1426   HGB 10.5 (L) 09/21/2017 1426   HCT 33.9 (L) 09/21/2017 1426   PLT 203 09/21/2017 1426   MCV 92.1 09/21/2017 1426   MCH 28.5 09/21/2017 1426   MCHC 31.0 09/21/2017 1426   RDW 20.6 (H) 09/21/2017 1426   LYMPHSABS 0.9 09/21/2017 1426   MONOABS 1.7 (H) 09/21/2017  1426   EOSABS 0.3 09/21/2017 1426   BASOSABS 0.0 09/21/2017 1426   CMP Latest Ref Rng & Units 09/21/2017 08/24/2017 07/24/2017  Glucose 65 - 99 mg/dL 125(H) 150(H) 89  BUN 6 - 20 mg/dL 21(H) 29(H) 20  Creatinine 0.44 - 1.00 mg/dL 1.09(H) 1.06(H) 1.35(H)  Sodium 135 - 145 mmol/L 142 137 136  Potassium 3.5 - 5.1 mmol/L 3.8 4.1 4.3  Chloride 101 - 111 mmol/L 106 103 99(L)  CO2 22 - 32 mmol/L _0 Calcium 8.9 - 10.3 mg/dL 8.5(L) 8.9 9.2  Total Protein 6.5 - 8.1 g/dL 6.2(L) 7.1 7.0  Total Bilirubin 0.3 - 1.2 mg/dL 0.5 0.4 0.4  Alkaline Phos 38 - 126 U/L 82 76 98  AST 15 - 41 U/L _1 ALT 14 - 54 U/L _2 ASSESSMENT & PLAN:   Multiple myeloma ([facility]) 1. IgG kappa multiple myeloma-started first cycle of Pomalyst 2 mg (days 1-21 every 28 days), ixazomib 2.3 mg (days 1, 8, 15 every 29 days), and Dexamethasone 10 mg (weekly) on 08/24/2017 due to chemical relapse.  She has tolerated it reasonably well although she felt somewhat tired.  Steroid tablets kept her jittery and awake for 2 days.  I have told her to take 5 mg on one day and 5 mg the following day.  We plan to recheck her blood counts prior to her next cycle on 10/01/2017.  I will see her back in 5 weeks prior to start of her cycle 3.  Labs from today showed normal white count with hemoglobin dropping to 10.5.  Platelet count was within normal limits.  2.  Bone protection: She is receiving zoledronic acid once every 3 months.  Her next dose will be on 10/29/2017.  3.  Peripheral neuropathy: She has numbness in the feet which has been stable.  This is from prior Velcade exposure.      Derek Jack MD [place] [phone]

## 2017-09-21 NOTE — Assessment & Plan Note (Deleted)
    Labs today: CBC diff, CMET.  I personally reviewed and went over laboratory results with the patient.  The results are noted within this dictation.  Labs demonstrate a white blood cell count of 11.4, normal hemoglobin at 12.0 g/dL, platelet count 253,000.  ANC is mildly elevated 9.2.  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  Rxs printed for aforementioned medications and treatment schedule.  Rxs given to Angie for benefits evaluation/submission.  Labs in 2 weeks: CBC diff, CMET, SPEP + IFE, light chain assay, B2M, LDH.  These will be her new baseline lab results.  Will refer patient for chemotherapy teaching for abovementioned chemotherapy medications/regimen.  Next of med infusion is due in April 2019 (every 3 months).  Return in 4 weeks for follow-up.  Will reevaluate response to therapy after approximately 3 months of treatment.  If response is noted, we will continue with current dosing schedule as mentioned above.  If response is suboptimal or lack of response is noted after 3 months of treatment, would recommend increasing ixazomib dose if patient is tolerating.  If ongoing tolerance is noted, I would not hesitate to increase Pomalyst after ixazomib dose increase.

## 2017-09-21 NOTE — Progress Notes (Signed)
Carol Bradley presents today for injection per the provider's orders.  B12 administration without incident; see MAR for injection details.  Patient tolerated procedure well and without incident.  No questions or complaints noted at this time.  Discharged ambulatory.  

## 2017-09-22 LAB — IGG, IGA, IGM
IGM (IMMUNOGLOBULIN M), SRM: 123 mg/dL (ref 26–217)
IgA: 107 mg/dL (ref 64–422)
IgG (Immunoglobin G), Serum: 772 mg/dL (ref 700–1600)

## 2017-09-22 LAB — BETA 2 MICROGLOBULIN, SERUM: Beta-2 Microglobulin: 2.7 mg/L — ABNORMAL HIGH (ref 0.6–2.4)

## 2017-09-24 ENCOUNTER — Other Ambulatory Visit (HOSPITAL_COMMUNITY): Payer: Self-pay | Admitting: Oncology

## 2017-09-24 LAB — PROTEIN ELECTROPHORESIS, SERUM
A/G Ratio: 1 (ref 0.7–1.7)
Albumin ELP: 2.8 g/dL — ABNORMAL LOW (ref 2.9–4.4)
Alpha-1-Globulin: 0.2 g/dL (ref 0.0–0.4)
Alpha-2-Globulin: 1 g/dL (ref 0.4–1.0)
Beta Globulin: 0.7 g/dL (ref 0.7–1.3)
GAMMA GLOBULIN: 0.8 g/dL (ref 0.4–1.8)
Globulin, Total: 2.7 g/dL (ref 2.2–3.9)
M-Spike, %: 0.3 g/dL — ABNORMAL HIGH
TOTAL PROTEIN ELP: 5.5 g/dL — AB (ref 6.0–8.5)

## 2017-09-25 LAB — IMMUNOFIXATION ELECTROPHORESIS
IGA: 109 mg/dL (ref 64–422)
IGG (IMMUNOGLOBIN G), SERUM: 774 mg/dL (ref 700–1600)
IgM (Immunoglobulin M), Srm: 130 mg/dL (ref 26–217)
Total Protein ELP: 5.6 g/dL — ABNORMAL LOW (ref 6.0–8.5)

## 2017-09-26 ENCOUNTER — Inpatient Hospital Stay (HOSPITAL_COMMUNITY): Payer: Medicare Other

## 2017-09-26 ENCOUNTER — Encounter (HOSPITAL_COMMUNITY): Payer: Self-pay

## 2017-09-26 ENCOUNTER — Ambulatory Visit (HOSPITAL_COMMUNITY): Payer: Medicare Other

## 2017-09-26 VITALS — BP 131/49 | HR 86 | Temp 97.6°F | Resp 18

## 2017-09-26 DIAGNOSIS — D801 Nonfamilial hypogammaglobulinemia: Secondary | ICD-10-CM

## 2017-09-26 DIAGNOSIS — N183 Chronic kidney disease, stage 3 (moderate): Secondary | ICD-10-CM

## 2017-09-26 DIAGNOSIS — D631 Anemia in chronic kidney disease: Secondary | ICD-10-CM

## 2017-09-26 DIAGNOSIS — C9000 Multiple myeloma not having achieved remission: Secondary | ICD-10-CM

## 2017-09-26 DIAGNOSIS — D509 Iron deficiency anemia, unspecified: Secondary | ICD-10-CM

## 2017-09-26 DIAGNOSIS — C9001 Multiple myeloma in remission: Secondary | ICD-10-CM | POA: Diagnosis not present

## 2017-09-26 DIAGNOSIS — E538 Deficiency of other specified B group vitamins: Secondary | ICD-10-CM

## 2017-09-26 MED ORDER — DARBEPOETIN ALFA 300 MCG/0.6ML IJ SOSY
300.0000 ug | PREFILLED_SYRINGE | Freq: Once | INTRAMUSCULAR | Status: AC
  Administered 2017-09-26: 300 ug via SUBCUTANEOUS
  Filled 2017-09-26: qty 0.6

## 2017-09-26 NOTE — Progress Notes (Signed)
Patient tolerated aranesp shot with no complaints voiced.  No swelling or bruising noted at site.  Band aid applied.  VSS with discharge and left ambulatory with no s/s of distress noted.

## 2017-09-26 NOTE — Patient Instructions (Signed)
[facility] at [facility]  Discharge Instructions:  You received an aranesp shot today.  _______________________________________________________________  Thank you for choosing [facility] at [facility] to provide your oncology and hematology care.  To afford each patient quality time with our providers, please arrive at least 15 minutes before your scheduled appointment.  You need to re-schedule your appointment if you arrive 10 or more minutes late.  We strive to give you quality time with our providers, and arriving late affects you and other patients whose appointments are after yours.  Also, if you no show three or more times for appointments you may be dismissed from the clinic.  Again, thank you for choosing [facility] at [facility]. Our hope is that these requests will allow you access to exceptional care and in a timely manner. _______________________________________________________________  If you have questions after your visit, please contact our office at [phone] between the hours of 8:30 a.m. and 5:00 p.m. Voicemails left after 4:30 p.m. will not be returned until the following business day. _______________________________________________________________  For prescription refill requests, have your pharmacy contact our office. _______________________________________________________________  Recommendations made by the consultant and any test results will be sent to your referring physician. _______________________________________________________________ 

## 2017-09-28 ENCOUNTER — Other Ambulatory Visit (HOSPITAL_COMMUNITY): Payer: Self-pay

## 2017-09-28 DIAGNOSIS — C9000 Multiple myeloma not having achieved remission: Secondary | ICD-10-CM

## 2017-10-01 ENCOUNTER — Inpatient Hospital Stay (HOSPITAL_COMMUNITY): Payer: Medicare Other

## 2017-10-01 DIAGNOSIS — C9001 Multiple myeloma in remission: Secondary | ICD-10-CM | POA: Diagnosis not present

## 2017-10-01 DIAGNOSIS — C9000 Multiple myeloma not having achieved remission: Secondary | ICD-10-CM

## 2017-10-01 LAB — COMPREHENSIVE METABOLIC PANEL
ALT: 26 U/L (ref 14–54)
ANION GAP: 10 (ref 5–15)
AST: 18 U/L (ref 15–41)
Albumin: 3 g/dL — ABNORMAL LOW (ref 3.5–5.0)
Alkaline Phosphatase: 80 U/L (ref 38–126)
BUN: 25 mg/dL — AB (ref 6–20)
CHLORIDE: 103 mmol/L (ref 101–111)
CO2: 25 mmol/L (ref 22–32)
Calcium: 8.8 mg/dL — ABNORMAL LOW (ref 8.9–10.3)
Creatinine, Ser: 1.24 mg/dL — ABNORMAL HIGH (ref 0.44–1.00)
GFR calc Af Amer: 48 mL/min — ABNORMAL LOW (ref >60)
GFR calc non Af Amer: 41 mL/min — ABNORMAL LOW (ref >60)
Glucose, Bld: 112 mg/dL — ABNORMAL HIGH (ref 65–99)
POTASSIUM: 3.9 mmol/L (ref 3.5–5.1)
SODIUM: 138 mmol/L (ref 135–145)
Total Bilirubin: 0.4 mg/dL (ref 0.3–1.2)
Total Protein: 6.6 g/dL (ref 6.5–8.1)

## 2017-10-01 LAB — CBC WITH DIFFERENTIAL/PLATELET
BASOS ABS: 0 10*3/uL (ref 0.0–0.1)
BASOS PCT: 0 %
EOS ABS: 0 10*3/uL (ref 0.0–0.7)
Eosinophils Relative: 0 %
HCT: 33.9 % — ABNORMAL LOW (ref 36.0–46.0)
Hemoglobin: 10.7 g/dL — ABNORMAL LOW (ref 12.0–15.0)
Lymphocytes Relative: 8 %
Lymphs Abs: 0.7 10*3/uL (ref 0.7–4.0)
MCH: 29.5 pg (ref 26.0–34.0)
MCHC: 31.6 g/dL (ref 30.0–36.0)
MCV: 93.4 fL (ref 78.0–100.0)
MONO ABS: 0.5 10*3/uL (ref 0.1–1.0)
Monocytes Relative: 6 %
NEUTROS ABS: 7.1 10*3/uL (ref 1.7–7.7)
Neutrophils Relative %: 86 %
PLATELETS: 279 10*3/uL (ref 150–400)
RBC: 3.63 MIL/uL — ABNORMAL LOW (ref 3.87–5.11)
RDW: 19.9 % — AB (ref 11.5–15.5)
WBC: 8.3 10*3/uL (ref 4.0–10.5)

## 2017-10-02 ENCOUNTER — Other Ambulatory Visit (HOSPITAL_COMMUNITY): Payer: Self-pay | Admitting: Adult Health

## 2017-10-02 DIAGNOSIS — C9001 Multiple myeloma in remission: Secondary | ICD-10-CM

## 2017-10-16 ENCOUNTER — Ambulatory Visit (HOSPITAL_COMMUNITY): Payer: Medicare Other

## 2017-10-16 ENCOUNTER — Other Ambulatory Visit (HOSPITAL_COMMUNITY): Payer: Medicare Other

## 2017-10-18 ENCOUNTER — Other Ambulatory Visit (HOSPITAL_COMMUNITY): Payer: Self-pay | Admitting: Emergency Medicine

## 2017-10-18 DIAGNOSIS — C9000 Multiple myeloma not having achieved remission: Secondary | ICD-10-CM

## 2017-10-18 MED ORDER — POMALIDOMIDE 2 MG PO CAPS: 2.0000 mg | ORAL_CAPSULE | Freq: Every day | ORAL | 0 refills | Status: DC

## 2017-10-19 ENCOUNTER — Ambulatory Visit (HOSPITAL_COMMUNITY): Payer: Medicare Other

## 2017-10-22 ENCOUNTER — Encounter (HOSPITAL_COMMUNITY): Payer: Self-pay

## 2017-10-22 ENCOUNTER — Inpatient Hospital Stay (HOSPITAL_COMMUNITY): Payer: Medicare Other | Attending: Oncology

## 2017-10-22 ENCOUNTER — Other Ambulatory Visit: Payer: Self-pay

## 2017-10-22 VITALS — BP 120/61 | HR 82 | Temp 97.1°F | Resp 18

## 2017-10-22 DIAGNOSIS — Z79899 Other long term (current) drug therapy: Secondary | ICD-10-CM | POA: Diagnosis not present

## 2017-10-22 DIAGNOSIS — Z7982 Long term (current) use of aspirin: Secondary | ICD-10-CM | POA: Diagnosis not present

## 2017-10-22 DIAGNOSIS — I129 Hypertensive chronic kidney disease with stage 1 through stage 4 chronic kidney disease, or unspecified chronic kidney disease: Secondary | ICD-10-CM | POA: Diagnosis not present

## 2017-10-22 DIAGNOSIS — E538 Deficiency of other specified B group vitamins: Secondary | ICD-10-CM

## 2017-10-22 DIAGNOSIS — G629 Polyneuropathy, unspecified: Secondary | ICD-10-CM | POA: Insufficient documentation

## 2017-10-22 DIAGNOSIS — Z9221 Personal history of antineoplastic chemotherapy: Secondary | ICD-10-CM | POA: Diagnosis not present

## 2017-10-22 DIAGNOSIS — N189 Chronic kidney disease, unspecified: Secondary | ICD-10-CM | POA: Insufficient documentation

## 2017-10-22 DIAGNOSIS — I2510 Atherosclerotic heart disease of native coronary artery without angina pectoris: Secondary | ICD-10-CM | POA: Diagnosis not present

## 2017-10-22 DIAGNOSIS — Z87891 Personal history of nicotine dependence: Secondary | ICD-10-CM | POA: Insufficient documentation

## 2017-10-22 DIAGNOSIS — C9000 Multiple myeloma not having achieved remission: Secondary | ICD-10-CM | POA: Insufficient documentation

## 2017-10-22 DIAGNOSIS — R531 Weakness: Secondary | ICD-10-CM | POA: Diagnosis not present

## 2017-10-22 DIAGNOSIS — R5383 Other fatigue: Secondary | ICD-10-CM | POA: Diagnosis not present

## 2017-10-22 DIAGNOSIS — R112 Nausea with vomiting, unspecified: Secondary | ICD-10-CM | POA: Insufficient documentation

## 2017-10-22 DIAGNOSIS — D801 Nonfamilial hypogammaglobulinemia: Secondary | ICD-10-CM | POA: Insufficient documentation

## 2017-10-22 DIAGNOSIS — R0609 Other forms of dyspnea: Secondary | ICD-10-CM | POA: Insufficient documentation

## 2017-10-22 DIAGNOSIS — J449 Chronic obstructive pulmonary disease, unspecified: Secondary | ICD-10-CM | POA: Insufficient documentation

## 2017-10-22 DIAGNOSIS — E785 Hyperlipidemia, unspecified: Secondary | ICD-10-CM | POA: Diagnosis not present

## 2017-10-22 MED ORDER — CYANOCOBALAMIN 1000 MCG/ML IJ SOLN
INTRAMUSCULAR | Status: AC
  Filled 2017-10-22: qty 1

## 2017-10-22 MED ORDER — CYANOCOBALAMIN 1000 MCG/ML IJ SOLN
1000.0000 ug | Freq: Once | INTRAMUSCULAR | Status: AC
  Administered 2017-10-22: 1000 ug via INTRAMUSCULAR

## 2017-10-22 NOTE — Patient Instructions (Signed)
[facility] at [facility] Discharge Instructions  B12 injection given Follow up as scheduled.   Thank you for choosing [facility] at  [facility] to provide your oncology and hematology care.  To afford each patient quality time with our provider, please arrive at least 15 minutes before your scheduled appointment time.   If you have a lab appointment with the Cancer Center please come in thru the  Main Entrance and check in at the main information desk  You need to re-schedule your appointment should you arrive 10 or more minutes late.  We strive to give you quality time with our providers, and arriving late affects you and other patients whose appointments are after yours.  Also, if you no show three or more times for appointments you may be dismissed from the clinic at the providers discretion.     Again, thank you for choosing  Cancer Center.  Our hope is that these requests will decrease the amount of time that you wait before being seen by our physicians.       _____________________________________________________________  Should you have questions after your visit to  Cancer Center, please contact our office at [phone] between the hours of 8:30 a.m. and 4:30 p.m.  Voicemails left after 4:30 p.m. will not be returned until the following business day.  For prescription refill requests, have your pharmacy contact our office.       Resources For Cancer Patients and their Caregivers ? American Cancer Society: Can assist with transportation, wigs, general needs, runs Look Good Feel Better.        1-888-227-6333 ? Cancer Care: Provides financial assistance, online support groups, medication/co-pay assistance.  1-800-813-HOPE (4673) ? [facility] Assists [place] cancer patients and their families through emotional , educational and financial support.  336-427-4357 ? [place]  DSS Where to apply for food stamps, Medicaid and utility assistance. 336-342-1394 ? [facility]: Transportation to medical appointments. 336-347-2287 ? Social Security Administration: May apply for disability if have a Stage IV cancer. 336-342-7796 1-800-772-1213 ? [place] Aging, Disability and Transit Services: Assists with nutrition, care and transit needs. 336-349-2343  Cancer Center Support Programs:   > Cancer Support Group  2nd Tuesday of the month 1pm-2pm, Journey Room   > Creative Journey  3rd Tuesday of the month 1130am-1pm, Journey Room    

## 2017-10-22 NOTE — Progress Notes (Signed)
Carol Bradley presents today for injection per MD orders. B12 1,000 administered IM in left Upper Arm. Administration without incident. Patient tolerated well.    Patient stated she was going to stop taking her pomalyst, Ninlaro, and decadron. She did take the last dose of pomalyst last night and day 8, 15 of the Ninlaro. No decadron today. Instructed patient to call Dr. Norma Fredrickson to let them know and what to do next.

## 2017-10-25 ENCOUNTER — Other Ambulatory Visit (HOSPITAL_COMMUNITY): Payer: Self-pay

## 2017-10-25 DIAGNOSIS — C9000 Multiple myeloma not having achieved remission: Secondary | ICD-10-CM

## 2017-10-25 DIAGNOSIS — D801 Nonfamilial hypogammaglobulinemia: Secondary | ICD-10-CM

## 2017-10-29 ENCOUNTER — Inpatient Hospital Stay (HOSPITAL_COMMUNITY): Payer: Medicare Other

## 2017-10-29 ENCOUNTER — Encounter (HOSPITAL_COMMUNITY): Payer: Self-pay | Admitting: Hematology

## 2017-10-29 ENCOUNTER — Inpatient Hospital Stay (HOSPITAL_BASED_OUTPATIENT_CLINIC_OR_DEPARTMENT_OTHER): Payer: Medicare Other | Admitting: Hematology

## 2017-10-29 VITALS — BP 123/49 | HR 78 | Temp 97.7°F | Resp 20 | Wt 152.5 lb

## 2017-10-29 DIAGNOSIS — E538 Deficiency of other specified B group vitamins: Secondary | ICD-10-CM

## 2017-10-29 DIAGNOSIS — E785 Hyperlipidemia, unspecified: Secondary | ICD-10-CM | POA: Diagnosis not present

## 2017-10-29 DIAGNOSIS — I2510 Atherosclerotic heart disease of native coronary artery without angina pectoris: Secondary | ICD-10-CM | POA: Diagnosis not present

## 2017-10-29 DIAGNOSIS — R112 Nausea with vomiting, unspecified: Secondary | ICD-10-CM

## 2017-10-29 DIAGNOSIS — J449 Chronic obstructive pulmonary disease, unspecified: Secondary | ICD-10-CM

## 2017-10-29 DIAGNOSIS — R5383 Other fatigue: Secondary | ICD-10-CM | POA: Diagnosis not present

## 2017-10-29 DIAGNOSIS — C9000 Multiple myeloma not having achieved remission: Secondary | ICD-10-CM

## 2017-10-29 DIAGNOSIS — D801 Nonfamilial hypogammaglobulinemia: Secondary | ICD-10-CM

## 2017-10-29 DIAGNOSIS — D509 Iron deficiency anemia, unspecified: Secondary | ICD-10-CM

## 2017-10-29 DIAGNOSIS — R531 Weakness: Secondary | ICD-10-CM | POA: Diagnosis not present

## 2017-10-29 DIAGNOSIS — N183 Chronic kidney disease, stage 3 unspecified: Secondary | ICD-10-CM

## 2017-10-29 DIAGNOSIS — R0609 Other forms of dyspnea: Secondary | ICD-10-CM | POA: Diagnosis not present

## 2017-10-29 DIAGNOSIS — I129 Hypertensive chronic kidney disease with stage 1 through stage 4 chronic kidney disease, or unspecified chronic kidney disease: Secondary | ICD-10-CM | POA: Diagnosis not present

## 2017-10-29 DIAGNOSIS — Z79899 Other long term (current) drug therapy: Secondary | ICD-10-CM

## 2017-10-29 DIAGNOSIS — N189 Chronic kidney disease, unspecified: Secondary | ICD-10-CM | POA: Diagnosis not present

## 2017-10-29 DIAGNOSIS — Z9221 Personal history of antineoplastic chemotherapy: Secondary | ICD-10-CM | POA: Diagnosis not present

## 2017-10-29 DIAGNOSIS — G629 Polyneuropathy, unspecified: Secondary | ICD-10-CM

## 2017-10-29 DIAGNOSIS — Z87891 Personal history of nicotine dependence: Secondary | ICD-10-CM

## 2017-10-29 DIAGNOSIS — Z7982 Long term (current) use of aspirin: Secondary | ICD-10-CM

## 2017-10-29 DIAGNOSIS — D631 Anemia in chronic kidney disease: Secondary | ICD-10-CM

## 2017-10-29 LAB — CBC WITH DIFFERENTIAL/PLATELET
BASOS ABS: 0.1 10*3/uL (ref 0.0–0.1)
BASOS PCT: 1 %
EOS ABS: 0.1 10*3/uL (ref 0.0–0.7)
EOS PCT: 1 %
HCT: 30.7 % — ABNORMAL LOW (ref 36.0–46.0)
Hemoglobin: 9.5 g/dL — ABNORMAL LOW (ref 12.0–15.0)
LYMPHS ABS: 1.7 10*3/uL (ref 0.7–4.0)
Lymphocytes Relative: 19 %
MCH: 28.8 pg (ref 26.0–34.0)
MCHC: 30.9 g/dL (ref 30.0–36.0)
MCV: 93 fL (ref 78.0–100.0)
Monocytes Absolute: 1.3 10*3/uL — ABNORMAL HIGH (ref 0.1–1.0)
Monocytes Relative: 15 %
Neutro Abs: 5.8 10*3/uL (ref 1.7–7.7)
Neutrophils Relative %: 64 %
PLATELETS: 350 10*3/uL (ref 150–400)
RBC: 3.3 MIL/uL — AB (ref 3.87–5.11)
RDW: 17 % — ABNORMAL HIGH (ref 11.5–15.5)
WBC: 9.1 10*3/uL (ref 4.0–10.5)

## 2017-10-29 LAB — COMPREHENSIVE METABOLIC PANEL
ALT: 15 U/L (ref 14–54)
AST: 19 U/L (ref 15–41)
Albumin: 2.9 g/dL — ABNORMAL LOW (ref 3.5–5.0)
Alkaline Phosphatase: 75 U/L (ref 38–126)
Anion gap: 9 (ref 5–15)
BUN: 21 mg/dL — ABNORMAL HIGH (ref 6–20)
CO2: 25 mmol/L (ref 22–32)
CREATININE: 1.06 mg/dL — AB (ref 0.44–1.00)
Calcium: 8.2 mg/dL — ABNORMAL LOW (ref 8.9–10.3)
Chloride: 105 mmol/L (ref 101–111)
GFR calc non Af Amer: 49 mL/min — ABNORMAL LOW (ref >60)
GFR, EST AFRICAN AMERICAN: 57 mL/min — AB (ref >60)
Glucose, Bld: 107 mg/dL — ABNORMAL HIGH (ref 65–99)
Potassium: 3.9 mmol/L (ref 3.5–5.1)
SODIUM: 139 mmol/L (ref 135–145)
Total Bilirubin: 0.5 mg/dL (ref 0.3–1.2)
Total Protein: 6.7 g/dL (ref 6.5–8.1)

## 2017-10-29 MED ORDER — ZOLEDRONIC ACID 4 MG/5ML IV CONC
3.0000 mg | Freq: Once | INTRAVENOUS | Status: AC
  Administered 2017-10-29: 3 mg via INTRAVENOUS
  Filled 2017-10-29: qty 3.75

## 2017-10-29 MED ORDER — DARBEPOETIN ALFA 300 MCG/0.6ML IJ SOSY
PREFILLED_SYRINGE | INTRAMUSCULAR | Status: AC
  Filled 2017-10-29: qty 0.6

## 2017-10-29 MED ORDER — SODIUM CHLORIDE 0.9 % IV SOLN
INTRAVENOUS | Status: DC
Start: 2017-10-29 — End: 2017-10-29
  Administered 2017-10-29: 16:00:00 via INTRAVENOUS

## 2017-10-29 MED ORDER — DARBEPOETIN ALFA 300 MCG/0.6ML IJ SOSY
300.0000 ug | PREFILLED_SYRINGE | Freq: Once | INTRAMUSCULAR | Status: AC
  Administered 2017-10-29: 300 ug via SUBCUTANEOUS

## 2017-10-29 NOTE — Progress Notes (Signed)
Corrected Ca++ 9.08. Okay to give Zometa today per Dr. Delton Coombes.   Carol Bradley presents today for injection per the provider's orders.  Aransep administration without incident; see MAR for injection details.  Patient tolerated procedure well and without incident.  No questions or complaints noted at this time.  Tolerated Zometa infusion w/o adverse reaction.  Alert, in no distress.  VSS.  Discharged ambulatory in c/o friend.

## 2017-10-29 NOTE — Progress Notes (Signed)
[place], [place] [address]   CLINIC:  Medical Oncology/Hematology  PCP:  [name], MD [place] [address] [phone]   REASON FOR VISIT:  Follow-up for multiple myeloma.  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       03/18/2015 - 07/28/2015 Chemotherapy    Velcade 1.6 mg/m2 discontinued secondary to intolerance      07/28/2015 Adverse Reaction    stool incontinence, weakness, collapse upon standing, felt to be secondary to velcade      11/09/2015 - 12/13/2015 Chemotherapy    cytoxan IV 300 mg/m2 administered X 2 doses only in addition to rev/dex      11/09/2015 - 06/08/2016 Chemotherapy    Revlimid/Dexamethasone       02/05/2017 - 07/09/2017 Chemotherapy    The patient had bortezomib SQ (VELCADE) chemo injection 1.75 mg, 1 mg/m2 = 1.75 mg (66.7 % of original dose 1.5 mg/m2), Subcutaneous,  Once, 1 of 8 cycles Dose modification: 1.5 mg/m2 (original dose 1.5 mg/m2, Cycle 1, Reason: Provider Judgment, Comment: Per Dr. Norma Fredrickson recommendations from [facility] 1 mg/m2 (original dose 1.5 mg/m2, Cycle 1, Reason: Provider Judgment)  for chemotherapy treatment.        07/09/2017 Adverse Reaction    Diarrhea       Chemotherapy    Pomalyst 2 mg (Days 1-21 every 28 days), Ixazomib 2.3 mg (days 1, 8, 15 every 28 days), and Dexamethasone 10 mg (weekly)- Rx's printed on 08/24/2017.  Treatment recommendations from Dr. Norma Fredrickson at [facility].        INTERVAL HISTORY:  Ms. Joerger 77 y.o. female returns for follow-up of her multiple myeloma.  She felt exhausted for the last 2 weeks.  She also complained of dyspnea on exertion.  She denied any diarrhea.  However she had nausea and  occasional vomiting.  She is also feeling very weak after second cycle.  Denies any worsening of her neuropathy.  Denies any fevers or infections.  No hospitalizations were reported.  REVIEW OF SYSTEMS:  Review of Systems  Constitutional: Positive for fatigue.  Respiratory: Positive for shortness of breath.   Gastrointestinal: Positive for nausea and vomiting.  All other systems reviewed and are negative.    PAST MEDICAL/SURGICAL HISTORY:  Past Medical History:  Diagnosis Date  . 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 CAD (coronary artery disease)   . COPD (chronic obstructive pulmonary disease) ([place])   . History of tobacco abuse   . Hyperlipidemia   . Hypertension   . 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 Multiple myeloma ([place])   . Vitamin B12 deficiency 04/15/2016   Overview:  Vitamin B12 level documented 155, January 2016 with the normal range being 211-924   Past Surgical History:  Procedure Laterality Date  . BACK SURGERY    . CARDIAC CATHETERIZATION  04/2011   right and left cath showing normal right heart pressures,but newly diagnosed coronary artery disease/drug eluting stent placed to RCA with residual disease in the proximal RCA and LAD and ramus, normal LV function and 60-65% EF  . COLONOSCOPY N/A 09/30/2014   Procedure: COLONOSCOPY;  Surgeon: Rogene Houston, MD;  Location: [facility];  Service: Endoscopy;  Laterality:  N/A;  225  . CORONARY ANGIOPLASTY WITH STENT PLACEMENT  04/2011   mid RCA: 3.0 X38 mm Promus DES. Residual 40% disease proximally  . NECK SURGERY       SOCIAL HISTORY:  Social History   Socioeconomic History  . Marital status: Widowed    Spouse name: Not on file  . Number of children: Not on file  . Years of education: Not on file  . Highest education level: Not on file  Occupational History  . 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 Financial resource strain: Not on file    . Food insecurity:    Worry: Not on file    Inability: Not on file  . Transportation needs:    Medical: Not on file    Non-medical: Not on file  Tobacco Use  . Smoking status: Former Smoker    Packs/day: 3.00    Years: 25.00    Pack years: 75.00    Types: Cigarettes    Last attempt to quit: 07/10/1994    Years since quitting: 23.3  . Smokeless tobacco: Never Used  Substance and Sexual Activity  . Alcohol use: No  . Drug use: Not on file  . Sexual activity: Not on file  Lifestyle  . Physical activity:    Days per week: Not on file    Minutes per session: Not on file  . Stress: Not on file  Relationships  . Social connections:    Talks on phone: Not on file    Gets together: Not on file    Attends religious service: Not on file    Active member of club or organization: Not on file    Attends meetings of clubs or organizations: Not on file    Relationship status: Not on file  . Intimate partner violence:    Fear of current or ex partner: Not on file    Emotionally abused: Not on file    Physically abused: Not on file    Forced sexual activity: Not on file  Other Topics Concern  . Not on file  Social History Narrative  . Not on file    FAMILY HISTORY:  Family History  Problem Relation Age of Onset  . Heart failure Mother   . Cancer Father     CURRENT MEDICATIONS:  Outpatient Encounter Medications as of 10/29/2017  Medication Sig  . acyclovir (ZOVIRAX) 200 MG capsule TAKE ONE CAPSULE BY MOUTH TWICE DAILY  . aspirin EC 81 MG tablet Take 1 tablet (81 mg total) by mouth daily.  . atorvastatin (LIPITOR) 10 MG tablet TAKE ONE TABLET BY MOUTH DAILY  . Calcium Carbonate-Vit D-Min (CALCIUM 600+D PLUS MINERALS) 600-400 MG-UNIT CHEW Chew 1 tablet 2 (two) times daily by mouth.   . cyanocobalamin (,VITAMIN B-12,) 1000 MCG/ML injection Inject 1 mL (1,000 mcg total) into the muscle every 30 (thirty) days.  . dexamethasone (DECADRON) 2 MG tablet Take 10 mg PO weekly (Patient not  taking: Reported on 10/22/2017)  . diltiazem (CARDIZEM CD) 240 MG 24 hr capsule TAKE ONE CAPSULE BY MOUTH DAILY  . guaiFENesin (MUCINEX) 600 MG 12 hr tablet Take 600 mg 2 (two) times daily as needed by mouth for cough or to loosen phlegm.  . losartan (COZAAR) 50 MG tablet Take 1 tablet (50 mg total) by mouth daily. (Patient taking differently: Take 50 mg 2 (two) times daily by mouth. )  . magnesium oxide (MAG-OX) 400 (241.3 Mg) MG tablet TAKE   ONE TABLET BY MOUTH TWICE DAILY. WHEN TAKING LASIX (Patient taking differently: TAKE ONE TABLET BY MOUTH DAILY)  . metoprolol tartrate (LOPRESSOR) 25 MG tablet TAKE ONE TABLET BY MOUTH TWICE DAILY  . NINLARO 2.3 MG capsule Take 1 capsule by mouth on days 1, 8, and 15 every 28 days. Take on an empty stomach. (Patient not taking: Reported on 10/22/2017)  . ondansetron (ZOFRAN) 8 MG tablet Take 1 tablet (8 mg total) by mouth every 8 (eight) hours as needed for nausea or vomiting. (Patient not taking: Reported on 10/22/2017)  . pomalidomide (POMALYST) 2 MG capsule Take 1 capsule (2 mg total) by mouth daily. Take with water on days 1-21. Repeat every 28 days. (Patient not taking: Reported on 10/22/2017)  . potassium chloride (K-DUR,KLOR-CON) 10 MEQ tablet Take 10 mEq by mouth once.   . potassium chloride (K-DUR,KLOR-CON) 10 MEQ tablet TAKE ONE TABLET BY MOUTH THREE TIMES DAILY WHEN TAKING LASIX   Facility-Administered Encounter Medications as of 10/29/2017  Medication  . cyanocobalamin ((VITAMIN B-12)) injection 1,000 mcg    ALLERGIES:  Allergies  Allergen Reactions  . Lisinopril Cough  . Plavix [Clopidogrel Bisulfate] Swelling     PHYSICAL EXAM:  ECOG Performance status: 2  Vitals:   10/29/17 1420  BP: (!) 123/49  Pulse: 78  Resp: 20  Temp: 97.7 F (36.5 C)  SpO2: 99%   Filed Weights   10/29/17 1420  Weight: 152 lb 8 oz (69.2 kg)    Physical Exam   LABORATORY DATA:  I have reviewed the labs as listed.  CBC    Component Value Date/Time    WBC 9.1 10/29/2017 1323   RBC 3.30 (L) 10/29/2017 1323   HGB 9.5 (L) 10/29/2017 1323   HCT 30.7 (L) 10/29/2017 1323   PLT 350 10/29/2017 1323   MCV 93.0 10/29/2017 1323   MCH 28.8 10/29/2017 1323   MCHC 30.9 10/29/2017 1323   RDW 17.0 (H) 10/29/2017 1323   LYMPHSABS 1.7 10/29/2017 1323   MONOABS 1.3 (H) 10/29/2017 1323   EOSABS 0.1 10/29/2017 1323   BASOSABS 0.1 10/29/2017 1323   CMP Latest Ref Rng & Units 10/29/2017 10/01/2017 09/21/2017  Glucose 65 - 99 mg/dL 107(H) 112(H) 125(H)  BUN 6 - 20 mg/dL 21(H) 25(H) 21(H)  Creatinine 0.44 - 1.00 mg/dL 1.06(H) 1.24(H) 1.09(H)  Sodium 135 - 145 mmol/L 139 138 142  Potassium 3.5 - 5.1 mmol/L 3.9 3.9 3.8  Chloride 101 - 111 mmol/L 105 103 106  CO2 22 - 32 mmol/L 25 25 25  Calcium 8.9 - 10.3 mg/dL 8.2(L) 8.8(L) 8.5(L)  Total Protein 6.5 - 8.1 g/dL 6.7 6.6 6.2(L)  Total Bilirubin 0.3 - 1.2 mg/dL 0.5 0.4 0.5  Alkaline Phos 38 - 126 U/L 75 80 82  AST 15 - 41 U/L 19 18 20  ALT 14 - 54 U/L 15 26 23        ASSESSMENT & PLAN:   Multiple myeloma ([facility]) 1. IgG kappa multiple myeloma-started first cycle of Pomalyst 2 mg (days 1-21 every 28 days), ixazomib 2.3 mg (days 1, 8, 15 every 28 days), and Dexamethasone 10 mg (weekly) on 08/24/2017 due to chemical relapse.  She has tolerated it reasonably well although she felt somewhat tired.  She felt severely tired after cycle 2.  She is ready to start cycle 3 today.  She also had a lot of shortness of breath on exertion last week.  She also had occasional nausea.  SPEP from today is pending.  SPEP on   09/21/2017 shows 0.3 g of IgG kappa monoclonal protein.  As she is having severe tiredness, I have recommended her to not to take Ninlaro with this cycle.  She will continue Pomalyst 2 mg along with dexamethasone 10 mg weekly.  I will reevaluate her in 1 month.  She also developed anemia with a hemoglobin of 9.5 from a combination.  2.  Bone protection: She is receiving zoledronic acid once every 3 months.  She  will receive zoledronic acid today.  3.  Peripheral neuropathy: She has numbness in the feet which has been stable.  This is from prior Velcade exposure.          Orders placed this encounter:  Orders Placed This Encounter  Procedures  . Protein electrophoresis, serum  . Kappa/lambda light chains  . CBC with Differential  . Comprehensive metabolic panel  . Protein electrophoresis, serum  . Kappa/lambda light chains       , MD [facility] [phone]  

## 2017-10-29 NOTE — Patient Instructions (Signed)
[facility] at [facility] Discharge Instructions  Today you saw Dr. K.   Thank you for choosing [facility] at [facility] to provide your oncology and hematology care.  To afford each patient quality time with our provider, please arrive at least 15 minutes before your scheduled appointment time.   If you have a lab appointment with the Cancer Center please come in thru the  Main Entrance and check in at the main information desk  You need to re-schedule your appointment should you arrive 10 or more minutes late.  We strive to give you quality time with our providers, and arriving late affects you and other patients whose appointments are after yours.  Also, if you no show three or more times for appointments you may be dismissed from the clinic at the providers discretion.     Again, thank you for choosing [facility].  Our hope is that these requests will decrease the amount of time that you wait before being seen by our physicians.       _____________________________________________________________  Should you have questions after your visit to [facility], please contact our office at [phone] between the hours of 8:30 a.m. and 4:30 p.m.  Voicemails left after 4:30 p.m. will not be returned until the following business day.  For prescription refill requests, have your pharmacy contact our office.       Resources For Cancer Patients and their Caregivers ? American Cancer Society: Can assist with transportation, wigs, general needs, runs Look Good Feel Better.        1-888-227-6333 ? Cancer Care: Provides financial assistance, online support groups, medication/co-pay assistance.  1-800-813-HOPE (4673) ? [facility] Assists [place] cancer patients and their families through emotional , educational and financial support.  336-427-4357 ? [place] DSS Where to apply for food  stamps, Medicaid and utility assistance. 336-342-1394 ? [facility]: Transportation to medical appointments. 336-347-2287 ? Social Security Administration: May apply for disability if have a Stage IV cancer. 336-342-7796 1-800-772-1213 ? [place] Aging, Disability and Transit Services: Assists with nutrition, care and transit needs. 336-349-2343  Cancer Center Support Programs:   > Cancer Support Group  2nd Tuesday of the month 1pm-2pm, Journey Room   > Creative Journey  3rd Tuesday of the month 1130am-1pm, Journey Room    

## 2017-10-29 NOTE — Assessment & Plan Note (Signed)
1. IgG kappa multiple myeloma-started first cycle of Pomalyst 2 mg (days 1-21 every 28 days), ixazomib 2.3 mg (days 1, 8, 15 every 28 days), and Dexamethasone 10 mg (weekly) on 08/24/2017 due to chemical relapse.  She has tolerated it reasonably well although she felt somewhat tired.  She felt severely tired after cycle 2.  She is ready to start cycle 3 today.  She also had a lot of shortness of breath on exertion last week.  She also had occasional nausea.  SPEP from today is pending.  SPEP on 09/21/2017 shows 0.3 g of IgG kappa monoclonal protein.  As she is having severe tiredness, I have recommended her to not to take Ninlaro with this cycle.  She will continue Pomalyst 2 mg along with dexamethasone 10 mg weekly.  I will reevaluate her in 1 month.  She also developed anemia with a hemoglobin of 9.5 from a combination.  2.  Bone protection: She is receiving zoledronic acid once every 3 months.  She will receive zoledronic acid today.  3.  Peripheral neuropathy: She has numbness in the feet which has been stable.  This is from prior Velcade exposure.

## 2017-10-30 LAB — KAPPA/LAMBDA LIGHT CHAINS
KAPPA, LAMDA LIGHT CHAIN RATIO: 2.41 — AB (ref 0.26–1.65)
Kappa free light chain: 83.5 mg/L — ABNORMAL HIGH (ref 3.3–19.4)
LAMDA FREE LIGHT CHAINS: 34.6 mg/L — AB (ref 5.7–26.3)

## 2017-10-31 LAB — PROTEIN ELECTROPHORESIS, SERUM
A/G Ratio: 0.9 (ref 0.7–1.7)
ALBUMIN ELP: 2.9 g/dL (ref 2.9–4.4)
ALPHA-1-GLOBULIN: 0.3 g/dL (ref 0.0–0.4)
Alpha-2-Globulin: 1.1 g/dL — ABNORMAL HIGH (ref 0.4–1.0)
Beta Globulin: 0.9 g/dL (ref 0.7–1.3)
Gamma Globulin: 1 g/dL (ref 0.4–1.8)
Globulin, Total: 3.4 g/dL (ref 2.2–3.9)
M-Spike, %: 0.4 g/dL — ABNORMAL HIGH
TOTAL PROTEIN ELP: 6.3 g/dL (ref 6.0–8.5)

## 2017-11-01 ENCOUNTER — Other Ambulatory Visit (HOSPITAL_COMMUNITY): Payer: Self-pay | Admitting: Adult Health

## 2017-11-01 ENCOUNTER — Other Ambulatory Visit: Payer: Self-pay | Admitting: Cardiology

## 2017-11-01 NOTE — Telephone Encounter (Signed)
Okay to refill? She last saw Dr. Raliegh Ip.

## 2017-11-01 NOTE — Telephone Encounter (Signed)
REFILL 

## 2017-11-09 ENCOUNTER — Other Ambulatory Visit (HOSPITAL_COMMUNITY): Payer: Self-pay

## 2017-11-09 DIAGNOSIS — C9000 Multiple myeloma not having achieved remission: Secondary | ICD-10-CM

## 2017-11-09 DIAGNOSIS — D509 Iron deficiency anemia, unspecified: Secondary | ICD-10-CM

## 2017-11-12 ENCOUNTER — Inpatient Hospital Stay (HOSPITAL_COMMUNITY): Payer: Medicare Other

## 2017-11-12 ENCOUNTER — Encounter (HOSPITAL_COMMUNITY): Payer: Self-pay

## 2017-11-12 ENCOUNTER — Inpatient Hospital Stay (HOSPITAL_COMMUNITY): Payer: Medicare Other | Attending: Hematology

## 2017-11-12 VITALS — BP 110/42 | HR 81 | Temp 98.4°F | Resp 16

## 2017-11-12 DIAGNOSIS — N183 Chronic kidney disease, stage 3 (moderate): Secondary | ICD-10-CM

## 2017-11-12 DIAGNOSIS — T451X5S Adverse effect of antineoplastic and immunosuppressive drugs, sequela: Secondary | ICD-10-CM | POA: Insufficient documentation

## 2017-11-12 DIAGNOSIS — E538 Deficiency of other specified B group vitamins: Secondary | ICD-10-CM | POA: Insufficient documentation

## 2017-11-12 DIAGNOSIS — Z87891 Personal history of nicotine dependence: Secondary | ICD-10-CM | POA: Diagnosis not present

## 2017-11-12 DIAGNOSIS — Z79899 Other long term (current) drug therapy: Secondary | ICD-10-CM | POA: Diagnosis not present

## 2017-11-12 DIAGNOSIS — I129 Hypertensive chronic kidney disease with stage 1 through stage 4 chronic kidney disease, or unspecified chronic kidney disease: Secondary | ICD-10-CM | POA: Diagnosis not present

## 2017-11-12 DIAGNOSIS — N189 Chronic kidney disease, unspecified: Secondary | ICD-10-CM | POA: Insufficient documentation

## 2017-11-12 DIAGNOSIS — D631 Anemia in chronic kidney disease: Secondary | ICD-10-CM | POA: Diagnosis not present

## 2017-11-12 DIAGNOSIS — Z9221 Personal history of antineoplastic chemotherapy: Secondary | ICD-10-CM | POA: Insufficient documentation

## 2017-11-12 DIAGNOSIS — J449 Chronic obstructive pulmonary disease, unspecified: Secondary | ICD-10-CM | POA: Insufficient documentation

## 2017-11-12 DIAGNOSIS — Z7982 Long term (current) use of aspirin: Secondary | ICD-10-CM | POA: Diagnosis not present

## 2017-11-12 DIAGNOSIS — E785 Hyperlipidemia, unspecified: Secondary | ICD-10-CM | POA: Insufficient documentation

## 2017-11-12 DIAGNOSIS — D801 Nonfamilial hypogammaglobulinemia: Secondary | ICD-10-CM

## 2017-11-12 DIAGNOSIS — D509 Iron deficiency anemia, unspecified: Secondary | ICD-10-CM | POA: Insufficient documentation

## 2017-11-12 DIAGNOSIS — G620 Drug-induced polyneuropathy: Secondary | ICD-10-CM | POA: Diagnosis not present

## 2017-11-12 DIAGNOSIS — C9000 Multiple myeloma not having achieved remission: Secondary | ICD-10-CM

## 2017-11-12 DIAGNOSIS — I2510 Atherosclerotic heart disease of native coronary artery without angina pectoris: Secondary | ICD-10-CM | POA: Insufficient documentation

## 2017-11-12 DIAGNOSIS — R5383 Other fatigue: Secondary | ICD-10-CM | POA: Insufficient documentation

## 2017-11-12 LAB — CBC WITH DIFFERENTIAL/PLATELET
BASOS ABS: 0 10*3/uL (ref 0.0–0.1)
Basophils Relative: 0 %
Eosinophils Absolute: 0.3 10*3/uL (ref 0.0–0.7)
Eosinophils Relative: 2 %
HEMATOCRIT: 30 % — AB (ref 36.0–46.0)
Hemoglobin: 9.2 g/dL — ABNORMAL LOW (ref 12.0–15.0)
Lymphocytes Relative: 9 %
Lymphs Abs: 1.4 10*3/uL (ref 0.7–4.0)
MCH: 28.1 pg (ref 26.0–34.0)
MCHC: 30.7 g/dL (ref 30.0–36.0)
MCV: 91.7 fL (ref 78.0–100.0)
MONOS PCT: 26 %
Monocytes Absolute: 4.1 10*3/uL — ABNORMAL HIGH (ref 0.1–1.0)
NEUTROS PCT: 63 %
Neutro Abs: 10.1 10*3/uL — ABNORMAL HIGH (ref 1.7–7.7)
PLATELETS: 386 10*3/uL (ref 150–400)
RBC: 3.27 MIL/uL — AB (ref 3.87–5.11)
RDW: 17.1 % — AB (ref 11.5–15.5)
WBC: 15.9 10*3/uL — AB (ref 4.0–10.5)

## 2017-11-12 MED ORDER — DARBEPOETIN ALFA 300 MCG/0.6ML IJ SOSY
300.0000 ug | PREFILLED_SYRINGE | Freq: Once | INTRAMUSCULAR | Status: AC
  Administered 2017-11-12: 300 ug via SUBCUTANEOUS
  Filled 2017-11-12: qty 0.6

## 2017-11-12 NOTE — Progress Notes (Signed)
Carol Bradley tolerated Aranesp injection well without complaints or incident.Hgb 9.2 today VSS Pt continues to take her Ninlaro and Pomalyst as prescribed without issues Pt discharged via wheelchair in satisfactory condition accompanied by a friend

## 2017-11-12 NOTE — Patient Instructions (Signed)
[facility] at [facility] Discharge Instructions  Received Aranesp injection today. Follow-up as scheduled. Call clinic for any questions or concerns   Thank you for choosing [facility] at [facility] to provide your oncology and hematology care.  To afford each patient quality time with our provider, please arrive at least 15 minutes before your scheduled appointment time.   If you have a lab appointment with the Cancer Center please come in thru the  Main Entrance and check in at the main information desk  You need to re-schedule your appointment should you arrive 10 or more minutes late.  We strive to give you quality time with our providers, and arriving late affects you and other patients whose appointments are after yours.  Also, if you no show three or more times for appointments you may be dismissed from the clinic at the providers discretion.     Again, thank you for choosing [facility].  Our hope is that these requests will decrease the amount of time that you wait before being seen by our physicians.       _____________________________________________________________  Should you have questions after your visit to [facility], please contact our office at [phone] between the hours of 8:30 a.m. and 4:30 p.m.  Voicemails left after 4:30 p.m. will not be returned until the following business day.  For prescription refill requests, have your pharmacy contact our office.       Resources For Cancer Patients and their Caregivers ? American Cancer Society: Can assist with transportation, wigs, general needs, runs Look Good Feel Better.        1-888-227-6333 ? Cancer Care: Provides financial assistance, online support groups, medication/co-pay assistance.  1-800-813-HOPE (4673) ? [facility] Assists [place] cancer patients and their families through emotional , educational and  financial support.  336-427-4357 ? [place] DSS Where to apply for food stamps, Medicaid and utility assistance. 336-342-1394 ? [facility]: Transportation to medical appointments. 336-347-2287 ? Social Security Administration: May apply for disability if have a Stage IV cancer. 336-342-7796 1-800-772-1213 ? [place] Aging, Disability and Transit Services: Assists with nutrition, care and transit needs. 336-349-2343  Cancer Center Support Programs:   > Cancer Support Group  2nd Tuesday of the month 1pm-2pm, Journey Room   > Creative Journey  3rd Tuesday of the month 1130am-1pm, Journey Room    

## 2017-11-19 ENCOUNTER — Other Ambulatory Visit (HOSPITAL_COMMUNITY): Payer: Self-pay

## 2017-11-19 ENCOUNTER — Other Ambulatory Visit (HOSPITAL_COMMUNITY): Payer: Self-pay | Admitting: Emergency Medicine

## 2017-11-19 ENCOUNTER — Ambulatory Visit (HOSPITAL_COMMUNITY): Payer: Medicare Other

## 2017-11-19 DIAGNOSIS — F419 Anxiety disorder, unspecified: Secondary | ICD-10-CM

## 2017-11-19 DIAGNOSIS — C9000 Multiple myeloma not having achieved remission: Secondary | ICD-10-CM

## 2017-11-19 DIAGNOSIS — C9001 Multiple myeloma in remission: Secondary | ICD-10-CM

## 2017-11-19 MED ORDER — POMALIDOMIDE 2 MG PO CAPS: 2.0000 mg | ORAL_CAPSULE | Freq: Every day | ORAL | 0 refills | Status: DC

## 2017-11-19 MED ORDER — DIAZEPAM 5 MG PO TABS: 5.0000 mg | ORAL_TABLET | Freq: Four times a day (QID) | ORAL | 1 refills | Status: DC | PRN

## 2017-11-19 NOTE — Telephone Encounter (Signed)
Received refill request from patients pharmacy for Valium. Reviewed with provider, chart checked and refilled.

## 2017-11-19 NOTE — Progress Notes (Signed)
pomaylst refilled

## 2017-11-23 ENCOUNTER — Inpatient Hospital Stay (HOSPITAL_COMMUNITY): Payer: Medicare Other

## 2017-11-23 ENCOUNTER — Other Ambulatory Visit: Payer: Self-pay

## 2017-11-23 ENCOUNTER — Ambulatory Visit (HOSPITAL_COMMUNITY): Payer: Medicare Other | Admitting: Hematology

## 2017-11-23 VITALS — BP 101/42 | HR 70 | Temp 97.9°F | Resp 16

## 2017-11-23 DIAGNOSIS — N183 Chronic kidney disease, stage 3 unspecified: Secondary | ICD-10-CM

## 2017-11-23 DIAGNOSIS — D649 Anemia, unspecified: Secondary | ICD-10-CM

## 2017-11-23 DIAGNOSIS — C9000 Multiple myeloma not having achieved remission: Secondary | ICD-10-CM

## 2017-11-23 DIAGNOSIS — E538 Deficiency of other specified B group vitamins: Secondary | ICD-10-CM

## 2017-11-23 DIAGNOSIS — D509 Iron deficiency anemia, unspecified: Secondary | ICD-10-CM

## 2017-11-23 DIAGNOSIS — D631 Anemia in chronic kidney disease: Secondary | ICD-10-CM

## 2017-11-23 LAB — COMPREHENSIVE METABOLIC PANEL
ALBUMIN: 2.9 g/dL — AB (ref 3.5–5.0)
ALK PHOS: 67 U/L (ref 38–126)
ALT: 16 U/L (ref 14–54)
ANION GAP: 7 (ref 5–15)
AST: 16 U/L (ref 15–41)
BILIRUBIN TOTAL: 0.5 mg/dL (ref 0.3–1.2)
BUN: 34 mg/dL — ABNORMAL HIGH (ref 6–20)
CALCIUM: 8.1 mg/dL — AB (ref 8.9–10.3)
CO2: 25 mmol/L (ref 22–32)
Chloride: 106 mmol/L (ref 101–111)
Creatinine, Ser: 1.07 mg/dL — ABNORMAL HIGH (ref 0.44–1.00)
GFR, EST AFRICAN AMERICAN: 57 mL/min — AB (ref >60)
GFR, EST NON AFRICAN AMERICAN: 49 mL/min — AB (ref >60)
GLUCOSE: 113 mg/dL — AB (ref 65–99)
Potassium: 4 mmol/L (ref 3.5–5.1)
Sodium: 138 mmol/L (ref 135–145)
TOTAL PROTEIN: 6 g/dL — AB (ref 6.5–8.1)

## 2017-11-23 LAB — CBC WITH DIFFERENTIAL/PLATELET
Basophils Absolute: 0 10*3/uL (ref 0.0–0.1)
Basophils Relative: 0 %
Eosinophils Absolute: 0 10*3/uL (ref 0.0–0.7)
Eosinophils Relative: 0 %
HEMATOCRIT: 26.9 % — AB (ref 36.0–46.0)
HEMOGLOBIN: 7.9 g/dL — AB (ref 12.0–15.0)
LYMPHS ABS: 1.3 10*3/uL (ref 0.7–4.0)
LYMPHS PCT: 16 %
MCH: 27.3 pg (ref 26.0–34.0)
MCHC: 29.4 g/dL — AB (ref 30.0–36.0)
MCV: 93.1 fL (ref 78.0–100.0)
MONOS PCT: 18 %
Monocytes Absolute: 1.5 10*3/uL — ABNORMAL HIGH (ref 0.1–1.0)
NEUTROS ABS: 5.2 10*3/uL (ref 1.7–7.7)
NEUTROS PCT: 66 %
Platelets: 288 10*3/uL (ref 150–400)
RBC: 2.89 MIL/uL — ABNORMAL LOW (ref 3.87–5.11)
RDW: 16.8 % — ABNORMAL HIGH (ref 11.5–15.5)
WBC: 7.9 10*3/uL (ref 4.0–10.5)

## 2017-11-23 LAB — IRON AND TIBC
IRON: 26 ug/dL — AB (ref 28–170)
Saturation Ratios: 8 % — ABNORMAL LOW (ref 10.4–31.8)
TIBC: 337 ug/dL (ref 250–450)
UIBC: 311 ug/dL

## 2017-11-23 LAB — FERRITIN: Ferritin: 11 ng/mL (ref 11–307)

## 2017-11-23 LAB — FOLATE: FOLATE: 9.6 ng/mL (ref >5.9)

## 2017-11-23 LAB — VITAMIN B12: Vitamin B-12: 711 pg/mL (ref 180–914)

## 2017-11-23 LAB — PREPARE RBC (CROSSMATCH)

## 2017-11-23 MED ORDER — SODIUM CHLORIDE 0.9 % IV SOLN
250.0000 mL | Freq: Once | INTRAVENOUS | Status: AC
  Administered 2017-11-23: 250 mL via INTRAVENOUS

## 2017-11-23 MED ORDER — DIPHENHYDRAMINE HCL 25 MG PO CAPS
25.0000 mg | ORAL_CAPSULE | Freq: Once | ORAL | Status: AC
  Administered 2017-11-23: 25 mg via ORAL

## 2017-11-23 MED ORDER — ACETAMINOPHEN 325 MG PO TABS
650.0000 mg | ORAL_TABLET | Freq: Once | ORAL | Status: AC
  Administered 2017-11-23: 650 mg via ORAL

## 2017-11-23 MED ORDER — DIPHENHYDRAMINE HCL 25 MG PO CAPS
ORAL_CAPSULE | ORAL | Status: AC
  Filled 2017-11-23: qty 1

## 2017-11-23 MED ORDER — CYANOCOBALAMIN 1000 MCG/ML IJ SOLN
INTRAMUSCULAR | Status: AC
  Filled 2017-11-23: qty 1

## 2017-11-23 MED ORDER — ACETAMINOPHEN 325 MG PO TABS
ORAL_TABLET | ORAL | Status: AC
  Filled 2017-11-23: qty 2

## 2017-11-23 MED ORDER — SODIUM CHLORIDE 0.9 % IV SOLN
510.0000 mg | Freq: Once | INTRAVENOUS | Status: AC
  Administered 2017-11-23: 510 mg via INTRAVENOUS
  Filled 2017-11-23: qty 17

## 2017-11-23 MED ORDER — SODIUM CHLORIDE 0.9 % IV SOLN
Freq: Once | INTRAVENOUS | Status: AC
  Administered 2017-11-23: 12:00:00 via INTRAVENOUS

## 2017-11-23 MED ORDER — CYANOCOBALAMIN 1000 MCG/ML IJ SOLN
1000.0000 ug | Freq: Once | INTRAMUSCULAR | Status: AC
  Administered 2017-11-23: 1000 ug via INTRAMUSCULAR

## 2017-11-23 MED ORDER — SODIUM CHLORIDE 0.9% FLUSH: 3.0000 mL | Freq: Once | INTRAVENOUS | Status: DC | PRN

## 2017-11-23 NOTE — Progress Notes (Signed)
Carol Bradley presents today for injection per the provider's orders.  B12 administration without incident; see MAR for injection details.  Patient tolerated procedure well and without incident.  No questions or complaints noted at this time.  Tolerated iron infusion and blood transfusion w/o adverse reaction.  Alert, in no distress.  VSS.  Discharged ambulatory.

## 2017-11-24 LAB — TYPE AND SCREEN
ABO/RH(D): O POS
Antibody Screen: NEGATIVE
Unit division: 0

## 2017-11-24 LAB — BPAM RBC
Blood Product Expiration Date: 201906042359
ISSUE DATE / TIME: 201905171308
Unit Type and Rh: 5100

## 2017-11-26 ENCOUNTER — Ambulatory Visit (HOSPITAL_COMMUNITY): Payer: Medicare Other

## 2017-11-26 ENCOUNTER — Other Ambulatory Visit (HOSPITAL_COMMUNITY): Payer: Medicare Other

## 2017-11-26 LAB — KAPPA/LAMBDA LIGHT CHAINS
KAPPA, LAMDA LIGHT CHAIN RATIO: 2.4 — AB (ref 0.26–1.65)
Kappa free light chain: 46.4 mg/L — ABNORMAL HIGH (ref 3.3–19.4)
Lambda free light chains: 19.3 mg/L (ref 5.7–26.3)

## 2017-11-27 LAB — PROTEIN ELECTROPHORESIS, SERUM
A/G Ratio: 1.1 (ref 0.7–1.7)
ALBUMIN ELP: 2.8 g/dL — AB (ref 2.9–4.4)
ALPHA-1-GLOBULIN: 0.2 g/dL (ref 0.0–0.4)
Alpha-2-Globulin: 0.8 g/dL (ref 0.4–1.0)
Beta Globulin: 0.8 g/dL (ref 0.7–1.3)
GAMMA GLOBULIN: 0.7 g/dL (ref 0.4–1.8)
GLOBULIN, TOTAL: 2.5 g/dL (ref 2.2–3.9)
M-SPIKE, %: 0.2 g/dL — AB
TOTAL PROTEIN ELP: 5.3 g/dL — AB (ref 6.0–8.5)

## 2017-12-03 ENCOUNTER — Other Ambulatory Visit (HOSPITAL_COMMUNITY): Payer: Self-pay | Admitting: *Deleted

## 2017-12-04 ENCOUNTER — Other Ambulatory Visit (HOSPITAL_COMMUNITY): Payer: Self-pay

## 2017-12-04 ENCOUNTER — Ambulatory Visit (HOSPITAL_COMMUNITY): Payer: Medicare Other

## 2017-12-04 ENCOUNTER — Other Ambulatory Visit (HOSPITAL_COMMUNITY): Payer: Self-pay | Admitting: Oncology

## 2017-12-04 DIAGNOSIS — N183 Chronic kidney disease, stage 3 unspecified: Secondary | ICD-10-CM

## 2017-12-04 DIAGNOSIS — D631 Anemia in chronic kidney disease: Secondary | ICD-10-CM

## 2017-12-04 DIAGNOSIS — C9000 Multiple myeloma not having achieved remission: Secondary | ICD-10-CM

## 2017-12-05 ENCOUNTER — Ambulatory Visit (HOSPITAL_COMMUNITY): Payer: Medicare Other

## 2017-12-05 ENCOUNTER — Inpatient Hospital Stay (HOSPITAL_COMMUNITY): Payer: Medicare Other

## 2017-12-05 ENCOUNTER — Inpatient Hospital Stay (HOSPITAL_BASED_OUTPATIENT_CLINIC_OR_DEPARTMENT_OTHER): Payer: Medicare Other | Admitting: Hematology

## 2017-12-05 ENCOUNTER — Encounter (HOSPITAL_COMMUNITY): Payer: Self-pay | Admitting: Hematology

## 2017-12-05 VITALS — BP 93/44 | HR 82 | Temp 97.9°F | Resp 20 | Wt 156.0 lb

## 2017-12-05 DIAGNOSIS — I129 Hypertensive chronic kidney disease with stage 1 through stage 4 chronic kidney disease, or unspecified chronic kidney disease: Secondary | ICD-10-CM | POA: Diagnosis not present

## 2017-12-05 DIAGNOSIS — Z87891 Personal history of nicotine dependence: Secondary | ICD-10-CM

## 2017-12-05 DIAGNOSIS — D509 Iron deficiency anemia, unspecified: Secondary | ICD-10-CM

## 2017-12-05 DIAGNOSIS — R5383 Other fatigue: Secondary | ICD-10-CM

## 2017-12-05 DIAGNOSIS — N189 Chronic kidney disease, unspecified: Secondary | ICD-10-CM

## 2017-12-05 DIAGNOSIS — C9000 Multiple myeloma not having achieved remission: Secondary | ICD-10-CM

## 2017-12-05 DIAGNOSIS — D801 Nonfamilial hypogammaglobulinemia: Secondary | ICD-10-CM

## 2017-12-05 DIAGNOSIS — Z9221 Personal history of antineoplastic chemotherapy: Secondary | ICD-10-CM | POA: Diagnosis not present

## 2017-12-05 DIAGNOSIS — G620 Drug-induced polyneuropathy: Secondary | ICD-10-CM

## 2017-12-05 DIAGNOSIS — N183 Chronic kidney disease, stage 3 unspecified: Secondary | ICD-10-CM

## 2017-12-05 DIAGNOSIS — Z7982 Long term (current) use of aspirin: Secondary | ICD-10-CM

## 2017-12-05 DIAGNOSIS — E538 Deficiency of other specified B group vitamins: Secondary | ICD-10-CM

## 2017-12-05 DIAGNOSIS — T451X5S Adverse effect of antineoplastic and immunosuppressive drugs, sequela: Secondary | ICD-10-CM | POA: Diagnosis not present

## 2017-12-05 DIAGNOSIS — E785 Hyperlipidemia, unspecified: Secondary | ICD-10-CM

## 2017-12-05 DIAGNOSIS — D631 Anemia in chronic kidney disease: Secondary | ICD-10-CM

## 2017-12-05 DIAGNOSIS — J449 Chronic obstructive pulmonary disease, unspecified: Secondary | ICD-10-CM | POA: Diagnosis not present

## 2017-12-05 DIAGNOSIS — Z79899 Other long term (current) drug therapy: Secondary | ICD-10-CM

## 2017-12-05 DIAGNOSIS — I2510 Atherosclerotic heart disease of native coronary artery without angina pectoris: Secondary | ICD-10-CM

## 2017-12-05 LAB — CBC WITH DIFFERENTIAL/PLATELET
BASOS ABS: 0 10*3/uL (ref 0.0–0.1)
Basophils Relative: 0 %
Eosinophils Absolute: 0 10*3/uL (ref 0.0–0.7)
Eosinophils Relative: 0 %
HEMATOCRIT: 35.8 % — AB (ref 36.0–46.0)
HEMOGLOBIN: 11 g/dL — AB (ref 12.0–15.0)
LYMPHS PCT: 7 %
Lymphs Abs: 0.8 10*3/uL (ref 0.7–4.0)
MCH: 29.1 pg (ref 26.0–34.0)
MCHC: 30.7 g/dL (ref 30.0–36.0)
MCV: 94.7 fL (ref 78.0–100.0)
MONOS PCT: 2 %
Monocytes Absolute: 0.2 10*3/uL (ref 0.1–1.0)
NEUTROS PCT: 91 %
Neutro Abs: 11.1 10*3/uL — ABNORMAL HIGH (ref 1.7–7.7)
Platelets: 263 10*3/uL (ref 150–400)
RBC: 3.78 MIL/uL — ABNORMAL LOW (ref 3.87–5.11)
RDW: 19.3 % — AB (ref 11.5–15.5)
WBC: 12.1 10*3/uL — AB (ref 4.0–10.5)

## 2017-12-05 LAB — COMPREHENSIVE METABOLIC PANEL
ALK PHOS: 86 U/L (ref 38–126)
ALT: 22 U/L (ref 14–54)
AST: 32 U/L (ref 15–41)
Albumin: 3.1 g/dL — ABNORMAL LOW (ref 3.5–5.0)
Anion gap: 11 (ref 5–15)
BILIRUBIN TOTAL: 0.6 mg/dL (ref 0.3–1.2)
BUN: 29 mg/dL — AB (ref 6–20)
CO2: 21 mmol/L — ABNORMAL LOW (ref 22–32)
Calcium: 8.3 mg/dL — ABNORMAL LOW (ref 8.9–10.3)
Chloride: 106 mmol/L (ref 101–111)
Creatinine, Ser: 1.4 mg/dL — ABNORMAL HIGH (ref 0.44–1.00)
GFR calc Af Amer: 41 mL/min — ABNORMAL LOW (ref >60)
GFR calc non Af Amer: 35 mL/min — ABNORMAL LOW (ref >60)
GLUCOSE: 216 mg/dL — AB (ref 65–99)
POTASSIUM: 3.9 mmol/L (ref 3.5–5.1)
Sodium: 138 mmol/L (ref 135–145)
TOTAL PROTEIN: 6.5 g/dL (ref 6.5–8.1)

## 2017-12-05 MED ORDER — SODIUM CHLORIDE 0.9 % IV SOLN
Freq: Once | INTRAVENOUS | Status: AC
Start: 2017-12-05 — End: 2017-12-05
  Administered 2017-12-05: 15:00:00 via INTRAVENOUS

## 2017-12-05 MED ORDER — FERUMOXYTOL INJECTION 510 MG/17 ML
510.0000 mg | Freq: Once | INTRAVENOUS | Status: AC
  Administered 2017-12-05: 510 mg via INTRAVENOUS
  Filled 2017-12-05: qty 17

## 2017-12-05 NOTE — Assessment & Plan Note (Signed)
1. 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 gain, -13 -Status post Velcade and dexamethasone from 03/18/2015 through 07/28/2015, Velcade discontinued secondary to neuropathy - Revlimid and dexamethasone from February 2017 through November 2017, achieving negative SPEP and immunofixation -Seen by Dr. Norma Fredrickson for second opinion, bone marrow biopsy on 11/14/2016 shows less than 2% plasma cells, maintenance Velcade started in May 2018, held in January 2019 due to diarrhea -Noted to have biochemical relapse with M spike of 0.5, recommended to start on reduced dose pomalidomide, Ninlaro and dexamethasone -Started first cycle of Pomalyst 2 mg (days 1-21 every 28 days), ixazomib 2.3 mg (days 1, 8, 15 every 28 days), and Dexamethasone 10 mg (weekly) on 08/24/2017 due to biochemical relapse.  She has tolerated it reasonably well although she felt somewhat tired.  She felt severely tired after cycle 2.  Ninlaro was held during third cycle due to excessive tiredness.  Cycle 4 of pomalidomide and dexamethasone started on 11/26/2017.  She is tolerating it very well without any side effects.  Her M spike on 11/23/2017 has come down to 0.2 from 0.4 on 10/29/2017.  Light chain ratio is stable at 2.4.  Kappa light chains have come down from 83-46. - I have recommended adding back Ninlaro at the start of next cycle.  I think the severe tiredness she experienced is partly due to anemia and iron deficiency.  She has an appointment to see Dr. Norma Fredrickson on 12/10/2017.  I will see her back in 1 month for follow-up.  2.  Bone protection: She is receiving zoledronic acid once every 3 months.  Last zoledronic acid 3 mg was on 10/29/2017.  3.  Peripheral neuropathy: She has numbness in the feet which has been stable.  This is from prior Velcade exposure.  4.   Normocytic anemia: This is from underlying chronic kidney disease and iron deficiency.  We have checked her ferritin last week, which was severely low at 11.  She received Feraheme infusion last week.  She felt much better.  She is receiving second infusion today.  Her hemoglobin improved to 11.  She will continue B12 injections monthly.

## 2017-12-05 NOTE — Progress Notes (Signed)
Tolerated infusion w/o adverse reaction.  Alert, in no distress.  VSS.  Discharged ambulatory.  

## 2017-12-05 NOTE — Progress Notes (Signed)
[address] [place], [place] [address]   CLINIC:  Medical Oncology/Hematology  PCP:  [name], MD [place] [address] [phone]   REASON FOR VISIT:  Follow-up for multiple myeloma.  CURRENT THERAPY: Pomalidomide and dexamethasone.  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       03/18/2015 - 07/28/2015 Chemotherapy    Velcade 1.6 mg/m2 discontinued secondary to intolerance      07/28/2015 Adverse Reaction    stool incontinence, weakness, collapse upon standing, felt to be secondary to velcade      11/09/2015 - 12/13/2015 Chemotherapy    cytoxan IV 300 mg/m2 administered X 2 doses only in addition to rev/dex      11/09/2015 - 06/08/2016 Chemotherapy    Revlimid/Dexamethasone       02/05/2017 - 07/09/2017 Chemotherapy    The patient had bortezomib SQ (VELCADE) chemo injection 1.75 mg, 1 mg/m2 = 1.75 mg (66.7 % of original dose 1.5 mg/m2), Subcutaneous,  Once, 1 of 8 cycles Dose modification: 1.5 mg/m2 (original dose 1.5 mg/m2, Cycle 1, Reason: Provider Judgment, Comment: Per Dr. Norma Fredrickson recommendations from [facility] 1 mg/m2 (original dose 1.5 mg/m2, Cycle 1, Reason: Provider Judgment)  for chemotherapy treatment.        07/09/2017 Adverse Reaction    Diarrhea       Chemotherapy    Pomalyst 2 mg (Days 1-21 every 28 days), Ixazomib 2.3 mg (days 1, 8, 15 every 28 days), and Dexamethasone 10 mg (weekly)- Rx's printed on 08/24/2017.  Treatment recommendations from Dr. Norma Fredrickson at [facility]         INTERVAL HISTORY:  Carol Bradley 77 y.o. female returns for follow-up of multiple myeloma.  She has tolerated cycle 3 of pomalidomide 2 mg 3 weeks on 1 week off and dexamethasone 10 mg weekly very well.  She started cycle 4 on 11/26/2017.  She  did not experience any diarrhea or constipation.  Her fatigue has improved tremendously after she received iron and blood last week.  Numbness in the feet has been stable.  Denies any fevers or infections in the last few months.  We held [facility] from the start of cycle 3 secondary to severe tiredness.  REVIEW OF SYSTEMS:  Review of Systems  Constitutional: Positive for fatigue.  Neurological: Positive for numbness.  All other systems reviewed and are negative.    PAST MEDICAL/SURGICAL HISTORY:  Past Medical History:  Diagnosis Date  . Anemia associated with stage 3 chronic renal failure ([place]) 04/13/2016  . CAD (coronary artery disease)   . COPD (chronic obstructive pulmonary disease) ([place])   . History of tobacco abuse   . Hyperlipidemia   . Hypertension   . 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 Multiple myeloma ([place])   . Vitamin B12 deficiency 04/15/2016   Overview:  Vitamin B12 level documented 155, January 2016 with the normal range being 211-924   Past Surgical History:  Procedure Laterality Date  . BACK SURGERY    . CARDIAC CATHETERIZATION  04/2011   right and left cath showing normal right heart pressures,but newly diagnosed coronary artery disease/drug eluting stent placed to RCA with residual disease in the proximal RCA and LAD and ramus, normal LV function and 60-65% EF  . COLONOSCOPY N/A 09/30/2014   Procedure:  COLONOSCOPY;  Surgeon: Rogene Houston, MD;  Location: [facility];  Service: Endoscopy;  Laterality: N/A;  225  . CORONARY ANGIOPLASTY WITH STENT PLACEMENT  04/2011   mid RCA: 3.0 X38 mm Promus DES. Residual 40% disease proximally  . NECK SURGERY       SOCIAL HISTORY:  Social History   Socioeconomic History  . Marital status: Widowed    Spouse name: Not on file  . Number of children: Not on file  . Years of education: Not on file  . Highest education level: Not on file  Occupational History  . 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 Financial resource strain: Not on file  . Food insecurity:    Worry: Not on file    Inability: Not on file  . Transportation needs:    Medical: Not on file    Non-medical: Not on file  Tobacco Use  . Smoking status: Former Smoker    Packs/day: 3.00    Years: 25.00    Pack years: 75.00    Types: Cigarettes    Last attempt to quit: 07/10/1994    Years since quitting: 23.4  . Smokeless tobacco: Never Used  Substance and Sexual Activity  . Alcohol use: No  . Drug use: Not on file  . Sexual activity: Not on file  Lifestyle  . Physical activity:    Days per week: Not on file    Minutes per session: Not on file  . Stress: Not on file  Relationships  . Social connections:    Talks on phone: Not on file    Gets together: Not on file    Attends religious service: Not on file    Active member of club or organization: Not on file    Attends meetings of clubs or organizations: Not on file    Relationship status: Not on file  . Intimate partner violence:    Fear of current or ex partner: Not on file    Emotionally abused: Not on file    Physically abused: Not on file    Forced sexual activity: Not on file  Other Topics Concern  . Not on file  Social History Narrative  . Not on file    FAMILY HISTORY:  Family History  Problem Relation Age of Onset  . Heart failure Mother   . Cancer Father     CURRENT MEDICATIONS:  Outpatient Encounter Medications as of 12/05/2017  Medication Sig  . acyclovir (ZOVIRAX) 200 MG capsule TAKE ONE CAPSULE BY MOUTH TWICE DAILY  . aspirin EC 81 MG tablet Take 1 tablet (81 mg total) by mouth daily.  Marland Kitchen atorvastatin (LIPITOR) 10 MG tablet TAKE ONE TABLET BY MOUTH DAILY  . Calcium Carbonate-Vit D-Min (CALCIUM 600+D PLUS MINERALS) 600-400 MG-UNIT CHEW Chew 1 tablet 2 (two) times daily by mouth.   . cyanocobalamin (,VITAMIN B-12,) 1000 MCG/ML injection Inject 1 mL (1,000 mcg total) into the muscle every 30 (thirty) days.  Marland Kitchen dexamethasone  (DECADRON) 2 MG tablet Take 10 mg PO weekly  . diazepam (VALIUM) 5 MG tablet Take 1 tablet (5 mg total) by mouth every 6 (six) hours as needed for anxiety.  Marland Kitchen diltiazem (CARDIZEM CD) 240 MG 24 hr capsule TAKE ONE CAPSULE BY MOUTH DAILY  . guaiFENesin (MUCINEX) 600 MG 12 hr tablet Take 600 mg 2 (two) times daily as needed by mouth for cough or to loosen phlegm.  Marland Kitchen losartan (COZAAR) 50 MG  tablet Take 1 tablet (50 mg total) by mouth daily. (Patient taking differently: Take 50 mg 2 (two) times daily by mouth. )  . magnesium oxide (MAG-OX) 400 (241.3 Mg) MG tablet TAKE ONE TABLET BY MOUTH TWICE DAILY. WHEN TAKING LASIX  . metoprolol tartrate (LOPRESSOR) 25 MG tablet TAKE ONE TABLET BY MOUTH TWICE DAILY  . ondansetron (ZOFRAN) 8 MG tablet Take 1 tablet (8 mg total) by mouth every 8 (eight) hours as needed for nausea or vomiting.  . pomalidomide (POMALYST) 2 MG capsule Take 1 capsule (2 mg total) by mouth daily. Take with water on days 1-21. Repeat every 28 days.  . potassium chloride (K-DUR,KLOR-CON) 10 MEQ tablet Take 10 mEq by mouth once.   . [DISCONTINUED] potassium chloride (K-DUR,KLOR-CON) 10 MEQ tablet TAKE ONE TABLET BY MOUTH THREE TIMES DAILY WHEN TAKING LASIX  . NINLARO 2.3 MG capsule Take 1 capsule by mouth on days 1, 8, and 15 every 28 days. Take on an empty stomach. (Patient not taking: Reported on 12/05/2017)   Facility-Administered Encounter Medications as of 12/05/2017  Medication  . cyanocobalamin ((VITAMIN B-12)) injection 1,000 mcg    ALLERGIES:  Allergies  Allergen Reactions  . Lisinopril Cough  . Plavix [Clopidogrel Bisulfate] Swelling     PHYSICAL EXAM:  ECOG Performance status: 1  Vitals:   12/05/17 1433  BP: (!) 93/44  Pulse: 79  Resp: 18  Temp: 97.9 F (36.6 C)   Filed Weights   12/05/17 1433  Weight: 156 lb (70.8 kg)    Physical Exam  Deferred. LABORATORY DATA:  I have reviewed the labs as listed.  CBC    Component Value Date/Time   WBC 12.1 (H)  12/05/2017 1350   RBC 3.78 (L) 12/05/2017 1350   HGB 11.0 (L) 12/05/2017 1350   HCT 35.8 (L) 12/05/2017 1350   PLT 263 12/05/2017 1350   MCV 94.7 12/05/2017 1350   MCH 29.1 12/05/2017 1350   MCHC 30.7 12/05/2017 1350   RDW 19.3 (H) 12/05/2017 1350   LYMPHSABS 0.8 12/05/2017 1350   MONOABS 0.2 12/05/2017 1350   EOSABS 0.0 12/05/2017 1350   BASOSABS 0.0 12/05/2017 1350   CMP Latest Ref Rng & Units 12/05/2017 11/23/2017 10/29/2017  Glucose 65 - 99 mg/dL 216(H) 113(H) 107(H)  BUN 6 - 20 mg/dL 29(H) 34(H) 21(H)  Creatinine 0.44 - 1.00 mg/dL 1.40(H) 1.07(H) 1.06(H)  Sodium 135 - 145 mmol/L 138 138 139  Potassium 3.5 - 5.1 mmol/L 3.9 4.0 3.9  Chloride 101 - 111 mmol/L 106 106 105  CO2 22 - 32 mmol/L 21(L) 25 25  Calcium 8.9 - 10.3 mg/dL 8.3(L) 8.1(L) 8.2(L)  Total Protein 6.5 - 8.1 g/dL 6.5 6.0(L) 6.7  Total Bilirubin 0.3 - 1.2 mg/dL 0.6 0.5 0.5  Alkaline Phos 38 - 126 U/L 86 67 75  AST 15 - 41 U/L 32 16 19  ALT 14 - 54 U/L _0 ASSESSMENT & PLAN:   Multiple myeloma ([facility]) 1. 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 gain, -13 -Status post Velcade and dexamethasone from 03/18/2015 through 07/28/2015, Velcade discontinued secondary to neuropathy - Revlimid and dexamethasone from February 2017 through November 2017, achieving negative SPEP and immunofixation -Seen by Dr. Norma Fredrickson for second opinion, bone marrow biopsy on 11/14/2016 shows less than 2% plasma cells,  maintenance Velcade started in May 2018, held in January 2019 due to diarrhea -Noted to have biochemical relapse with M spike of 0.5, recommended to start on reduced dose pomalidomide, Ninlaro and dexamethasone -Started first cycle of Pomalyst 2 mg (days 1-21 every 28 days), ixazomib 2.3 mg (days 1, 8, 15 every 28 days), and Dexamethasone 10 mg  (weekly) on 08/24/2017 due to biochemical relapse.  She has tolerated it reasonably well although she felt somewhat tired.  She felt severely tired after cycle 2.  Ninlaro was held during third cycle due to excessive tiredness.  Cycle 4 of pomalidomide and dexamethasone started on 11/26/2017.  She is tolerating it very well without any side effects.  Her M spike on 11/23/2017 has come down to 0.2 from 0.4 on 10/29/2017.  Light chain ratio is stable at 2.4.  Kappa light chains have come down from 83-46. - I have recommended adding back Ninlaro at the start of next cycle.  I think the severe tiredness she experienced is partly due to anemia and iron deficiency.  She has an appointment to see Dr. Norma Fredrickson on 12/10/2017.  I will see her back in 1 month for follow-up.  2.  Bone protection: She is receiving zoledronic acid once every 3 months.  Last zoledronic acid 3 mg was on 10/29/2017.  3.  Peripheral neuropathy: She has numbness in the feet which has been stable.  This is from prior Velcade exposure.  4.  Normocytic anemia: This is from underlying chronic kidney disease and iron deficiency.  We have checked her ferritin last week, which was severely low at 11.  She received Feraheme infusion last week.  She felt much better.  She is receiving second infusion today.  Her hemoglobin improved to 11.  She will continue B12 injections monthly.          Orders placed this encounter:  No orders of the defined types were placed in this encounter.     Derek Jack, MD [place] [phone]

## 2017-12-06 LAB — KAPPA/LAMBDA LIGHT CHAINS
KAPPA FREE LGHT CHN: 68.7 mg/L — AB (ref 3.3–19.4)
KAPPA, LAMDA LIGHT CHAIN RATIO: 2.17 — AB (ref 0.26–1.65)
Lambda free light chains: 31.7 mg/L — ABNORMAL HIGH (ref 5.7–26.3)

## 2017-12-07 ENCOUNTER — Ambulatory Visit (HOSPITAL_COMMUNITY): Payer: Medicare Other

## 2017-12-07 LAB — PROTEIN ELECTROPHORESIS, SERUM
A/G Ratio: 1.2 (ref 0.7–1.7)
Albumin ELP: 3.2 g/dL (ref 2.9–4.4)
Alpha-1-Globulin: 0.3 g/dL (ref 0.0–0.4)
Alpha-2-Globulin: 0.9 g/dL (ref 0.4–1.0)
Beta Globulin: 0.8 g/dL (ref 0.7–1.3)
GAMMA GLOBULIN: 0.6 g/dL (ref 0.4–1.8)
Globulin, Total: 2.7 g/dL (ref 2.2–3.9)
M-SPIKE, %: 0.2 g/dL — AB
TOTAL PROTEIN ELP: 5.9 g/dL — AB (ref 6.0–8.5)

## 2017-12-14 ENCOUNTER — Telehealth: Payer: Self-pay | Admitting: Cardiology

## 2017-12-14 NOTE — Telephone Encounter (Signed)
Called no answer

## 2017-12-14 NOTE — Telephone Encounter (Signed)
Returned call to patient no answer.LMTC. 

## 2017-12-14 NOTE — Telephone Encounter (Signed)
Returned call to patient she stated she has been sob.Stated oncologist wanted her to see Dr.Jordan.Stated she saw PCP yesterday he prescribed antibiotics for sinus infection.Advised to keep appointment with Dr.Jordan 6/13 at 8:20 am.

## 2017-12-14 NOTE — Telephone Encounter (Signed)
Pt calling   Pt c/o Shortness Of Breath: STAT if SOB developed within the last 24 hours or pt is noticeably SOB on the phone  1. Are you currently SOB (can you hear that pt is SOB on the phone)? No  2. How long have you been experiencing SOB? 4-5 days  3. Are you SOB when sitting or when up moving around? Moving around  4. Are you currently experiencing any other symptoms? No

## 2017-12-17 ENCOUNTER — Telehealth (HOSPITAL_COMMUNITY): Payer: Self-pay

## 2017-12-17 ENCOUNTER — Other Ambulatory Visit (HOSPITAL_COMMUNITY): Payer: Self-pay

## 2017-12-17 DIAGNOSIS — C9000 Multiple myeloma not having achieved remission: Secondary | ICD-10-CM

## 2017-12-17 MED ORDER — POMALIDOMIDE 2 MG PO CAPS: 2.0000 mg | ORAL_CAPSULE | Freq: Every day | ORAL | 0 refills | Status: DC

## 2017-12-17 NOTE — Telephone Encounter (Signed)
Refill request for Pomlyst 2mg .  Chart checked and filled.

## 2017-12-17 NOTE — Telephone Encounter (Signed)
Refill request printed and signed by Dr. Delton Coombes.  Prescription sent to specialty pharmacy per Durene Cal.

## 2017-12-17 NOTE — Telephone Encounter (Signed)
-----   Message from Epifanio Lesches sent at 12/17/2017 10:07 AM EDT ----- REFILL ON POMALYST 2MG    LAST FILLED BY DR K.

## 2017-12-18 NOTE — Progress Notes (Signed)
Carol Bradley Date of Birth: 11/21/1940 Medical Record #[id number]  History of Present Illness: Carol Bradley is seen for evaluation of SOB.   She has a history of COPD and mild pulmonary hypertension. She also is a history of coronary disease and is status post stenting of the mid to distal RCA in October 2012. Her presenting symptoms were of dyspnea and not chest pain. She also has labile hypertension. She developed a cough on ACE inhibitors. She developed hyponatremia on HCTZ. She has been on losartan, diltiazem, lopressor.   She is being treated for myeloma with maintenance chemo. She has required periodic blood transfusions and iron infusions for anemia.   On follow up today she reports she is more SOB. It is worse with exertion. Her Hgb did drop to 7.9 3 weeks ago. Post transfusion Hgb increased to 11. Even with correction of anemia and treatment of sinus infection her symptoms of dyspnea have persisted. No chest pain. No edema or cough. SOB occurs just walking across the room.   Current Outpatient Medications on File Prior to Visit  Medication Sig Dispense Refill  . acyclovir (ZOVIRAX) 200 MG capsule TAKE ONE CAPSULE BY MOUTH TWICE DAILY 180 capsule 1  . aspirin EC 81 MG tablet Take 1 tablet (81 mg total) by mouth daily. 90 tablet 3  . atorvastatin (LIPITOR) 10 MG tablet TAKE ONE TABLET BY MOUTH DAILY 90 tablet 2  . Calcium Carbonate-Vit D-Min (CALCIUM 600+D PLUS MINERALS) 600-400 MG-UNIT CHEW Chew 1 tablet 2 (two) times daily by mouth.     . cyanocobalamin (,VITAMIN B-12,) 1000 MCG/ML injection Inject 1 mL (1,000 mcg total) into the muscle every 30 (thirty) days. 1 mL 0  . dexamethasone (DECADRON) 2 MG tablet Take 10 mg PO weekly 20 tablet 3  . diazepam (VALIUM) 5 MG tablet Take 1 tablet (5 mg total) by mouth every 6 (six) hours as needed for anxiety. 30 tablet 1  . diltiazem (CARDIZEM CD) 240 MG 24 hr capsule TAKE ONE CAPSULE BY MOUTH DAILY 30 capsule 2  . guaiFENesin (MUCINEX) 600 MG  12 hr tablet Take 600 mg 2 (two) times daily as needed by mouth for cough or to loosen phlegm.    Marland Kitchen losartan (COZAAR) 50 MG tablet Take 1 tablet (50 mg total) by mouth daily. (Patient taking differently: Take 50 mg 2 (two) times daily by mouth. ) 90 tablet 3  . magnesium oxide (MAG-OX) 400 (241.3 Mg) MG tablet TAKE ONE TABLET BY MOUTH TWICE DAILY. WHEN TAKING LASIX 60 tablet 1  . metoprolol tartrate (LOPRESSOR) 25 MG tablet TAKE ONE TABLET BY MOUTH TWICE DAILY 180 tablet 0  . NINLARO 2.3 MG capsule Take 1 capsule by mouth on days 1, 8, and 15 every 28 days. Take on an empty stomach. 3 capsule 10  . ondansetron (ZOFRAN) 8 MG tablet Take 1 tablet (8 mg total) by mouth every 8 (eight) hours as needed for nausea or vomiting. 30 tablet 2  . pomalidomide (POMALYST) 2 MG capsule Take 1 capsule (2 mg total) by mouth daily. Take with water on days 1-21. Repeat every 28 days. 21 capsule 0  . potassium chloride (K-DUR,KLOR-CON) 10 MEQ tablet Take 10 mEq by mouth once.      Current Facility-Administered Medications on File Prior to Visit  Medication Dose Route Frequency Provider Last Rate Last Dose  . cyanocobalamin ((VITAMIN B-12)) injection 1,000 mcg  1,000 mcg Intramuscular Q30 days Carol Hawking, NP  Allergies  Allergen Reactions  . Lisinopril Cough  . Plavix [Clopidogrel Bisulfate] Swelling    Past Medical History:  Diagnosis Date  . 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 CAD (coronary artery disease)   . COPD (chronic obstructive pulmonary disease) ([place])   . History of tobacco abuse   . Hyperlipidemia   . Hypertension   . 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 Multiple myeloma ([place])   . Vitamin B12 deficiency 04/15/2016   Overview:  Vitamin B12 level documented 155, January 2016 with the normal range being 211-924    Past Surgical History:  Procedure Laterality Date  . BACK SURGERY    . CARDIAC CATHETERIZATION  04/2011   right and left cath  showing normal right heart pressures,but newly diagnosed coronary artery disease/drug eluting stent placed to RCA with residual disease in the proximal RCA and LAD and ramus, normal LV function and 60-65% EF  . COLONOSCOPY N/A 09/30/2014   Procedure: COLONOSCOPY;  Surgeon: Rogene Houston, MD;  Location[facility];  Service: Endoscopy;  Laterality: N/A;  225  . CORONARY ANGIOPLASTY WITH STENT PLACEMENT  04/2011   mid RCA: 3.0 X38 mm Promus DES. Residual 40% disease proximally  . NECK SURGERY      Social History   Tobacco Use  Smoking Status Former Smoker  . Packs/day: 3.00  . Years: 25.00  . Pack years: 75.00  . Types: Cigarettes  . Last attempt to quit: 07/10/1994  . Years since quitting: 23.4  Smokeless Tobacco Never Used    Social History   Substance and Sexual Activity  Alcohol Use No    Family History  Problem Relation Age of Onset  . Heart failure Mother   . Cancer Father     Review of Systems: As noted in HPI.  All other systems were reviewed and are negative.  Physical Exam: BP 124/66   Pulse 64   Ht '5\' 7"'$  (1.702 m)   Wt 152 lb (68.9 kg)   BMI 23.81 kg/m   GENERAL:  Well appearing WF in NAD HEENT:  PERRL, EOMI, sclera are clear. Oropharynx is clear. NECK:  No jugular venous distention, carotid upstroke brisk and symmetric, no bruits, no thyromegaly or adenopathy LUNGS:  Clear to auscultation bilaterally CHEST:  Unremarkable HEART:  RRR,  PMI not displaced or sustained,S1 and S2 within normal limits, no S3, no S4: no clicks, no rubs, no murmurs ABD:  Soft, nontender. BS +, no masses or bruits. No hepatomegaly, no splenomegaly EXT:  2 + pulses throughout, no edema, no cyanosis no clubbing SKIN:  Warm and dry.  No rashes NEURO:  Alert and oriented x 3. Cranial nerves II through XII intact. PSYCH:  Cognitively intact      LABORATORY DATA: Lab Results  Component Value Date   WBC 12.1 (H) 12/05/2017   HGB 11.0 (L) 12/05/2017   HCT 35.8 (L) 12/05/2017    PLT 263 12/05/2017   GLUCOSE 216 (H) 12/05/2017   CHOL 156 06/28/2016   TRIG 127 06/28/2016   HDL 61 06/28/2016   LDLCALC 70 06/28/2016   ALT 22 12/05/2017   AST 32 12/05/2017   NA 138 12/05/2017   K 3.9 12/05/2017   CL 106 12/05/2017   CREATININE 1.40 (H) 12/05/2017   BUN 29 (H) 12/05/2017   CO2 21 (L) 12/05/2017   INR 1.04 05/05/2011     Assessment / Plan: 1. Coronary disease status post stenting of the RCA in October of  2012 with a drug-eluting stent.   Normal  stress Myoview study in June 2016. She is now experiencing symptoms of dyspnea on exertion which are also her prior anginal symptoms. Will arrange for a Lexiscan myoview. Would like to avoid cardiac cath if possible given her myeloma and CKD but if myoview is abnormal we may need to consider. Will also arrange and Echocardiogram to assess LV function and rule out effusion.  2. Hypercholesterolemia. Continue Lipitor therapy. Will arrange for follow up lipid panel and LFTs with next blood draw.   3. COPD with remote history of tobacco use.  4. Hypertension- currently well controlled.  5. CKD stage 3. Creatinine 1.4. Needs to avoid contrast studies if possible due to myeloma.  6. Multiple myeloma- per oncology.   7. 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

## 2017-12-20 ENCOUNTER — Encounter: Payer: Self-pay | Admitting: Cardiology

## 2017-12-20 ENCOUNTER — Ambulatory Visit (INDEPENDENT_AMBULATORY_CARE_PROVIDER_SITE_OTHER): Payer: Medicare Other | Admitting: Cardiology

## 2017-12-20 VITALS — BP 124/66 | HR 64 | Ht 67.0 in | Wt 152.0 lb

## 2017-12-20 DIAGNOSIS — I10 Essential (primary) hypertension: Secondary | ICD-10-CM | POA: Diagnosis not present

## 2017-12-20 DIAGNOSIS — C9000 Multiple myeloma not having achieved remission: Secondary | ICD-10-CM

## 2017-12-20 DIAGNOSIS — I25119 Atherosclerotic heart disease of native coronary artery with unspecified angina pectoris: Secondary | ICD-10-CM | POA: Diagnosis not present

## 2017-12-20 DIAGNOSIS — E785 Hyperlipidemia, unspecified: Secondary | ICD-10-CM | POA: Diagnosis not present

## 2017-12-20 DIAGNOSIS — R0609 Other forms of dyspnea: Secondary | ICD-10-CM

## 2017-12-20 NOTE — Patient Instructions (Signed)
We will schedule you for a nuclear stress test and echocardiogram  Continue your current medication

## 2017-12-21 ENCOUNTER — Ambulatory Visit (HOSPITAL_COMMUNITY): Payer: Medicare Other | Admitting: Hematology

## 2017-12-21 ENCOUNTER — Ambulatory Visit (HOSPITAL_COMMUNITY): Payer: Medicare Other

## 2017-12-21 ENCOUNTER — Other Ambulatory Visit (HOSPITAL_COMMUNITY): Payer: Medicare Other

## 2017-12-24 ENCOUNTER — Inpatient Hospital Stay (HOSPITAL_COMMUNITY): Payer: Medicare Other | Attending: Hematology

## 2017-12-24 ENCOUNTER — Other Ambulatory Visit (HOSPITAL_COMMUNITY)
Admission: RE | Admit: 2017-12-24 | Discharge: 2017-12-24 | Disposition: A | Discharge status: Home / Self Care | Payer: Medicare Other | Source: Ambulatory Visit | Attending: Cardiology | Admitting: Cardiology

## 2017-12-24 ENCOUNTER — Encounter (HOSPITAL_COMMUNITY): Payer: Self-pay

## 2017-12-24 ENCOUNTER — Inpatient Hospital Stay (HOSPITAL_COMMUNITY): Payer: Medicare Other

## 2017-12-24 VITALS — BP 114/47 | HR 60 | Temp 97.8°F | Resp 18

## 2017-12-24 DIAGNOSIS — D631 Anemia in chronic kidney disease: Secondary | ICD-10-CM | POA: Diagnosis not present

## 2017-12-24 DIAGNOSIS — I129 Hypertensive chronic kidney disease with stage 1 through stage 4 chronic kidney disease, or unspecified chronic kidney disease: Secondary | ICD-10-CM | POA: Insufficient documentation

## 2017-12-24 DIAGNOSIS — C9000 Multiple myeloma not having achieved remission: Secondary | ICD-10-CM | POA: Insufficient documentation

## 2017-12-24 DIAGNOSIS — N183 Chronic kidney disease, stage 3 (moderate): Secondary | ICD-10-CM

## 2017-12-24 DIAGNOSIS — Z9221 Personal history of antineoplastic chemotherapy: Secondary | ICD-10-CM | POA: Insufficient documentation

## 2017-12-24 DIAGNOSIS — R0609 Other forms of dyspnea: Secondary | ICD-10-CM | POA: Insufficient documentation

## 2017-12-24 DIAGNOSIS — E538 Deficiency of other specified B group vitamins: Secondary | ICD-10-CM | POA: Diagnosis not present

## 2017-12-24 DIAGNOSIS — D509 Iron deficiency anemia, unspecified: Secondary | ICD-10-CM | POA: Insufficient documentation

## 2017-12-24 DIAGNOSIS — I25119 Atherosclerotic heart disease of native coronary artery with unspecified angina pectoris: Secondary | ICD-10-CM | POA: Diagnosis present

## 2017-12-24 DIAGNOSIS — E785 Hyperlipidemia, unspecified: Secondary | ICD-10-CM | POA: Insufficient documentation

## 2017-12-24 DIAGNOSIS — N189 Chronic kidney disease, unspecified: Secondary | ICD-10-CM | POA: Diagnosis not present

## 2017-12-24 DIAGNOSIS — D801 Nonfamilial hypogammaglobulinemia: Secondary | ICD-10-CM

## 2017-12-24 DIAGNOSIS — I10 Essential (primary) hypertension: Secondary | ICD-10-CM | POA: Insufficient documentation

## 2017-12-24 LAB — CBC WITH DIFFERENTIAL/PLATELET
BASOS ABS: 0.1 10*3/uL (ref 0.0–0.1)
Basophils Relative: 1 %
EOS ABS: 0.1 10*3/uL (ref 0.0–0.7)
Eosinophils Relative: 1 %
HEMATOCRIT: 33.7 % — AB (ref 36.0–46.0)
Hemoglobin: 10.3 g/dL — ABNORMAL LOW (ref 12.0–15.0)
Lymphocytes Relative: 16 %
Lymphs Abs: 1.5 10*3/uL (ref 0.7–4.0)
MCH: 29.9 pg (ref 26.0–34.0)
MCHC: 30.6 g/dL (ref 30.0–36.0)
MCV: 98 fL (ref 78.0–100.0)
MONO ABS: 1.4 10*3/uL — AB (ref 0.1–1.0)
Monocytes Relative: 15 %
NEUTROS ABS: 6.2 10*3/uL (ref 1.7–7.7)
NEUTROS PCT: 67 %
PLATELETS: 277 10*3/uL (ref 150–400)
RBC: 3.44 MIL/uL — ABNORMAL LOW (ref 3.87–5.11)
RDW: 19 % — AB (ref 11.5–15.5)
WBC: 9.3 10*3/uL (ref 4.0–10.5)

## 2017-12-24 LAB — LIPID PANEL
CHOLESTEROL: 133 mg/dL (ref 0–200)
HDL: 45 mg/dL (ref >40)
LDL Cholesterol: 73 mg/dL (ref 0–99)
Total CHOL/HDL Ratio: 3 RATIO
Triglycerides: 74 mg/dL (ref <150)
VLDL: 15 mg/dL (ref 0–40)

## 2017-12-24 LAB — COMPREHENSIVE METABOLIC PANEL
ALBUMIN: 3.1 g/dL — AB (ref 3.5–5.0)
ALK PHOS: 68 U/L (ref 38–126)
ALT: 14 U/L (ref 14–54)
ANION GAP: 9 (ref 5–15)
AST: 19 U/L (ref 15–41)
BUN: 27 mg/dL — ABNORMAL HIGH (ref 6–20)
CALCIUM: 8.5 mg/dL — AB (ref 8.9–10.3)
CO2: 25 mmol/L (ref 22–32)
CREATININE: 1.17 mg/dL — AB (ref 0.44–1.00)
Chloride: 105 mmol/L (ref 101–111)
GFR, EST AFRICAN AMERICAN: 51 mL/min — AB (ref >60)
GFR, EST NON AFRICAN AMERICAN: 44 mL/min — AB (ref >60)
Glucose, Bld: 84 mg/dL (ref 65–99)
Potassium: 4 mmol/L (ref 3.5–5.1)
SODIUM: 139 mmol/L (ref 135–145)
TOTAL PROTEIN: 6.3 g/dL — AB (ref 6.5–8.1)
Total Bilirubin: 0.7 mg/dL (ref 0.3–1.2)

## 2017-12-24 LAB — HEPATIC FUNCTION PANEL
ALBUMIN: 3.1 g/dL — AB (ref 3.5–5.0)
ALK PHOS: 69 U/L (ref 38–126)
ALT: 16 U/L (ref 14–54)
AST: 17 U/L (ref 15–41)
BILIRUBIN DIRECT: 0.1 mg/dL (ref 0.1–0.5)
BILIRUBIN TOTAL: 0.4 mg/dL (ref 0.3–1.2)
Indirect Bilirubin: 0.3 mg/dL (ref 0.3–0.9)
Total Protein: 5.8 g/dL — ABNORMAL LOW (ref 6.5–8.1)

## 2017-12-24 LAB — IRON AND TIBC
Iron: 109 ug/dL (ref 28–170)
SATURATION RATIOS: 45 % — AB (ref 10.4–31.8)
TIBC: 242 ug/dL — ABNORMAL LOW (ref 250–450)
UIBC: 133 ug/dL

## 2017-12-24 LAB — FERRITIN: FERRITIN: 443 ng/mL — AB (ref 11–307)

## 2017-12-24 MED ORDER — DARBEPOETIN ALFA 300 MCG/0.6ML IJ SOSY
300.0000 ug | PREFILLED_SYRINGE | Freq: Once | INTRAMUSCULAR | Status: AC
  Administered 2017-12-24: 300 ug via SUBCUTANEOUS
  Filled 2017-12-24: qty 0.6

## 2017-12-24 NOTE — Patient Instructions (Signed)
[facility]  Discharge Instructions:  You received an aranesp shot today.  _______________________________________________________________  Thank you for choosing [facility] to provide your oncology and hematology care.  To afford each patient quality time with our providers, please arrive at least 15 minutes before your scheduled appointment.  You need to re-schedule your appointment if you arrive 10 or more minutes late.  We strive to give you quality time with our providers, and arriving late affects you and other patients whose appointments are after yours.  Also, if you no show three or more times for appointments you may be dismissed from the clinic.  Again, thank you for choosing [facility]. Our hope is that these requests will allow you access to exceptional care and in a timely manner. _______________________________________________________________  If you have questions after your visit, please contact our office at [phone] between the hours of 8:30 a.m. and 5:00 p.m. Voicemails left after 4:30 p.m. will not be returned until the following business day. _______________________________________________________________  For prescription refill requests, have your pharmacy contact our office. _______________________________________________________________  Recommendations made by the consultant and any test results will be sent to your referring physician. _______________________________________________________________ 

## 2017-12-24 NOTE — Progress Notes (Signed)
Reviewed chart and labs with Dr. Delton Coombes for supportive plan update for signature.  Ok to give Aranesp today verbal order Dr. Delton Coombes.   Patient tolerated aranesp shot with no complaints voiced.  Tolerated shot with no complaints voiced.  No bruising or swelling noted.  Band aid applied. VSS with discharge and left ambulatory with no s/s of distress noted.

## 2017-12-25 LAB — PROTEIN ELECTROPHORESIS, SERUM
A/G Ratio: 1.1 (ref 0.7–1.7)
ALPHA-1-GLOBULIN: 0.2 g/dL (ref 0.0–0.4)
ALPHA-2-GLOBULIN: 0.9 g/dL (ref 0.4–1.0)
Albumin ELP: 3 g/dL (ref 2.9–4.4)
BETA GLOBULIN: 0.7 g/dL (ref 0.7–1.3)
Gamma Globulin: 0.8 g/dL (ref 0.4–1.8)
Globulin, Total: 2.7 g/dL (ref 2.2–3.9)
M-Spike, %: 0.3 g/dL — ABNORMAL HIGH
Total Protein ELP: 5.7 g/dL — ABNORMAL LOW (ref 6.0–8.5)

## 2017-12-25 LAB — KAPPA/LAMBDA LIGHT CHAINS
Kappa free light chain: 61.1 mg/L — ABNORMAL HIGH (ref 3.3–19.4)
Kappa, lambda light chain ratio: 2.23 — ABNORMAL HIGH (ref 0.26–1.65)
LAMDA FREE LIGHT CHAINS: 27.4 mg/L — AB (ref 5.7–26.3)

## 2017-12-27 ENCOUNTER — Ambulatory Visit (HOSPITAL_COMMUNITY): Payer: Medicare Other | Attending: Cardiology

## 2017-12-27 ENCOUNTER — Telehealth (HOSPITAL_COMMUNITY): Payer: Self-pay

## 2017-12-27 ENCOUNTER — Other Ambulatory Visit: Payer: Self-pay

## 2017-12-27 DIAGNOSIS — J449 Chronic obstructive pulmonary disease, unspecified: Secondary | ICD-10-CM | POA: Insufficient documentation

## 2017-12-27 DIAGNOSIS — I083 Combined rheumatic disorders of mitral, aortic and tricuspid valves: Secondary | ICD-10-CM | POA: Diagnosis not present

## 2017-12-27 DIAGNOSIS — I272 Pulmonary hypertension, unspecified: Secondary | ICD-10-CM | POA: Insufficient documentation

## 2017-12-27 DIAGNOSIS — R0609 Other forms of dyspnea: Secondary | ICD-10-CM | POA: Insufficient documentation

## 2017-12-27 DIAGNOSIS — I10 Essential (primary) hypertension: Secondary | ICD-10-CM | POA: Diagnosis present

## 2017-12-27 DIAGNOSIS — I25119 Atherosclerotic heart disease of native coronary artery with unspecified angina pectoris: Secondary | ICD-10-CM | POA: Insufficient documentation

## 2017-12-27 DIAGNOSIS — E785 Hyperlipidemia, unspecified: Secondary | ICD-10-CM | POA: Insufficient documentation

## 2017-12-27 DIAGNOSIS — C9000 Multiple myeloma not having achieved remission: Secondary | ICD-10-CM | POA: Insufficient documentation

## 2017-12-27 NOTE — Telephone Encounter (Signed)
Encounter complete. 

## 2018-01-01 ENCOUNTER — Ambulatory Visit (HOSPITAL_COMMUNITY)
Admission: RE | Admit: 2018-01-01 | Discharge: 2018-01-01 | Disposition: A | Discharge status: Home / Self Care | Payer: Medicare Other | Source: Ambulatory Visit | Attending: Cardiovascular Disease | Admitting: Cardiovascular Disease

## 2018-01-01 DIAGNOSIS — I25119 Atherosclerotic heart disease of native coronary artery with unspecified angina pectoris: Secondary | ICD-10-CM | POA: Insufficient documentation

## 2018-01-01 DIAGNOSIS — I10 Essential (primary) hypertension: Secondary | ICD-10-CM | POA: Diagnosis not present

## 2018-01-01 DIAGNOSIS — R0609 Other forms of dyspnea: Secondary | ICD-10-CM

## 2018-01-01 DIAGNOSIS — C9000 Multiple myeloma not having achieved remission: Secondary | ICD-10-CM | POA: Diagnosis not present

## 2018-01-01 DIAGNOSIS — E785 Hyperlipidemia, unspecified: Secondary | ICD-10-CM | POA: Diagnosis not present

## 2018-01-01 LAB — MYOCARDIAL PERFUSION IMAGING
CHL CUP NUCLEAR SDS: 6
CHL CUP NUCLEAR SRS: 2
CHL CUP RESTING HR STRESS: 71 {beats}/min
CSEPPHR: 104 {beats}/min
LV dias vol: 81 mL (ref 46–106)
LVSYSVOL: 22 mL
NUC STRESS TID: 1.08
SSS: 8

## 2018-01-01 MED ORDER — TECHNETIUM TC 99M TETROFOSMIN IV KIT
9.3000 | PACK | Freq: Once | INTRAVENOUS | Status: AC | PRN
  Administered 2018-01-01: 9.3 via INTRAVENOUS
  Filled 2018-01-01: qty 10

## 2018-01-01 MED ORDER — TECHNETIUM TC 99M TETROFOSMIN IV KIT
30.4000 | PACK | Freq: Once | INTRAVENOUS | Status: AC | PRN
Start: 2018-01-01 — End: 2018-01-01
  Administered 2018-01-01: 30.4 via INTRAVENOUS
  Filled 2018-01-01: qty 31

## 2018-01-01 MED ORDER — REGADENOSON 0.4 MG/5ML IV SOLN
0.4000 mg | Freq: Once | INTRAVENOUS | Status: AC
  Administered 2018-01-01: 0.4 mg via INTRAVENOUS

## 2018-01-02 ENCOUNTER — Other Ambulatory Visit: Payer: Self-pay | Admitting: Cardiology

## 2018-01-03 ENCOUNTER — Other Ambulatory Visit (HOSPITAL_COMMUNITY): Payer: Self-pay | Admitting: *Deleted

## 2018-01-03 MED ORDER — POTASSIUM CHLORIDE CRYS ER 10 MEQ PO TBCR: 10.0000 meq | EXTENDED_RELEASE_TABLET | Freq: Three times a day (TID) | ORAL | 4 refills | Status: DC

## 2018-01-03 MED ORDER — MAGNESIUM OXIDE 400 (241.3 MG) MG PO TABS: ORAL_TABLET | ORAL | 1 refills | Status: DC

## 2018-01-04 ENCOUNTER — Other Ambulatory Visit (HOSPITAL_COMMUNITY): Payer: Self-pay

## 2018-01-04 DIAGNOSIS — D631 Anemia in chronic kidney disease: Secondary | ICD-10-CM

## 2018-01-04 DIAGNOSIS — N183 Chronic kidney disease, stage 3 unspecified: Secondary | ICD-10-CM

## 2018-01-07 ENCOUNTER — Inpatient Hospital Stay (HOSPITAL_COMMUNITY): Payer: Medicare Other

## 2018-01-07 ENCOUNTER — Inpatient Hospital Stay (HOSPITAL_BASED_OUTPATIENT_CLINIC_OR_DEPARTMENT_OTHER): Payer: Medicare Other | Admitting: Hematology

## 2018-01-07 ENCOUNTER — Inpatient Hospital Stay (HOSPITAL_COMMUNITY): Payer: Medicare Other | Attending: Hematology

## 2018-01-07 ENCOUNTER — Encounter (HOSPITAL_COMMUNITY): Payer: Self-pay | Admitting: Hematology

## 2018-01-07 ENCOUNTER — Ambulatory Visit (HOSPITAL_COMMUNITY): Payer: Medicare Other | Admitting: Hematology

## 2018-01-07 ENCOUNTER — Ambulatory Visit (HOSPITAL_COMMUNITY): Payer: Medicare Other

## 2018-01-07 VITALS — BP 124/64 | HR 66 | Temp 97.6°F | Resp 16 | Wt 152.0 lb

## 2018-01-07 DIAGNOSIS — Z7982 Long term (current) use of aspirin: Secondary | ICD-10-CM | POA: Diagnosis not present

## 2018-01-07 DIAGNOSIS — G620 Drug-induced polyneuropathy: Secondary | ICD-10-CM | POA: Diagnosis not present

## 2018-01-07 DIAGNOSIS — C9000 Multiple myeloma not having achieved remission: Secondary | ICD-10-CM

## 2018-01-07 DIAGNOSIS — Z9221 Personal history of antineoplastic chemotherapy: Secondary | ICD-10-CM | POA: Diagnosis not present

## 2018-01-07 DIAGNOSIS — D631 Anemia in chronic kidney disease: Secondary | ICD-10-CM | POA: Diagnosis not present

## 2018-01-07 DIAGNOSIS — Z79899 Other long term (current) drug therapy: Secondary | ICD-10-CM | POA: Diagnosis not present

## 2018-01-07 DIAGNOSIS — N183 Chronic kidney disease, stage 3 unspecified: Secondary | ICD-10-CM

## 2018-01-07 DIAGNOSIS — J449 Chronic obstructive pulmonary disease, unspecified: Secondary | ICD-10-CM | POA: Insufficient documentation

## 2018-01-07 DIAGNOSIS — T451X5S Adverse effect of antineoplastic and immunosuppressive drugs, sequela: Secondary | ICD-10-CM | POA: Insufficient documentation

## 2018-01-07 DIAGNOSIS — I129 Hypertensive chronic kidney disease with stage 1 through stage 4 chronic kidney disease, or unspecified chronic kidney disease: Secondary | ICD-10-CM | POA: Insufficient documentation

## 2018-01-07 DIAGNOSIS — D509 Iron deficiency anemia, unspecified: Secondary | ICD-10-CM | POA: Insufficient documentation

## 2018-01-07 DIAGNOSIS — R5383 Other fatigue: Secondary | ICD-10-CM | POA: Diagnosis not present

## 2018-01-07 DIAGNOSIS — R0602 Shortness of breath: Secondary | ICD-10-CM | POA: Insufficient documentation

## 2018-01-07 DIAGNOSIS — R531 Weakness: Secondary | ICD-10-CM | POA: Diagnosis not present

## 2018-01-07 DIAGNOSIS — Z87891 Personal history of nicotine dependence: Secondary | ICD-10-CM | POA: Insufficient documentation

## 2018-01-07 DIAGNOSIS — N189 Chronic kidney disease, unspecified: Secondary | ICD-10-CM | POA: Insufficient documentation

## 2018-01-07 DIAGNOSIS — R42 Dizziness and giddiness: Secondary | ICD-10-CM | POA: Insufficient documentation

## 2018-01-07 DIAGNOSIS — I2510 Atherosclerotic heart disease of native coronary artery without angina pectoris: Secondary | ICD-10-CM | POA: Diagnosis not present

## 2018-01-07 DIAGNOSIS — E785 Hyperlipidemia, unspecified: Secondary | ICD-10-CM | POA: Diagnosis not present

## 2018-01-07 DIAGNOSIS — E538 Deficiency of other specified B group vitamins: Secondary | ICD-10-CM

## 2018-01-07 LAB — CBC WITH DIFFERENTIAL/PLATELET
BASOS ABS: 0.1 10*3/uL (ref 0.0–0.1)
BASOS PCT: 1 %
EOS PCT: 3 %
Eosinophils Absolute: 0.4 10*3/uL (ref 0.0–0.7)
HCT: 38.8 % (ref 36.0–46.0)
Hemoglobin: 12.1 g/dL (ref 12.0–15.0)
Lymphocytes Relative: 9 %
Lymphs Abs: 1.1 10*3/uL (ref 0.7–4.0)
MCH: 31.8 pg (ref 26.0–34.0)
MCHC: 31.2 g/dL (ref 30.0–36.0)
MCV: 102.1 fL — ABNORMAL HIGH (ref 78.0–100.0)
MONO ABS: 3.5 10*3/uL — AB (ref 0.1–1.0)
Monocytes Relative: 28 %
Neutro Abs: 7.5 10*3/uL (ref 1.7–7.7)
Neutrophils Relative %: 59 %
PLATELETS: 248 10*3/uL (ref 150–400)
RBC: 3.8 MIL/uL — AB (ref 3.87–5.11)
RDW: 20.4 % — AB (ref 11.5–15.5)
WBC: 12.6 10*3/uL — AB (ref 4.0–10.5)

## 2018-01-07 LAB — COMPREHENSIVE METABOLIC PANEL
ALBUMIN: 3.2 g/dL — AB (ref 3.5–5.0)
ALT: 16 U/L (ref 0–44)
AST: 15 U/L (ref 15–41)
Alkaline Phosphatase: 83 U/L (ref 38–126)
Anion gap: 6 (ref 5–15)
BUN: 21 mg/dL (ref 8–23)
CO2: 27 mmol/L (ref 22–32)
Calcium: 8.4 mg/dL — ABNORMAL LOW (ref 8.9–10.3)
Chloride: 106 mmol/L (ref 98–111)
Creatinine, Ser: 1.02 mg/dL — ABNORMAL HIGH (ref 0.44–1.00)
GFR calc Af Amer: 60 mL/min — ABNORMAL LOW (ref >60)
GFR, EST NON AFRICAN AMERICAN: 52 mL/min — AB (ref >60)
GLUCOSE: 103 mg/dL — AB (ref 70–99)
POTASSIUM: 4.2 mmol/L (ref 3.5–5.1)
SODIUM: 139 mmol/L (ref 135–145)
Total Bilirubin: 0.6 mg/dL (ref 0.3–1.2)
Total Protein: 6.6 g/dL (ref 6.5–8.1)

## 2018-01-07 MED ORDER — CYANOCOBALAMIN 1000 MCG/ML IJ SOLN
INTRAMUSCULAR | Status: AC
  Filled 2018-01-07: qty 1

## 2018-01-07 MED ORDER — CYANOCOBALAMIN 1000 MCG/ML IJ SOLN
1000.0000 ug | Freq: Once | INTRAMUSCULAR | Status: AC
  Administered 2018-01-07: 1000 ug via INTRAMUSCULAR

## 2018-01-07 NOTE — Progress Notes (Signed)
[place], [place] [address]   CLINIC:  Medical Oncology/Hematology  PCP:  [name], MD [address] [place] [address] [phone]   REASON FOR VISIT:  Follow-up for Multiple Myeloma  CURRENT THERAPY: Pomalidomide and dexamethasone.    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       03/18/2015 - 07/28/2015 Chemotherapy    Velcade 1.6 mg/m2 discontinued secondary to intolerance      07/28/2015 Adverse Reaction    stool incontinence, weakness, collapse upon standing, felt to be secondary to velcade      11/09/2015 - 12/13/2015 Chemotherapy    cytoxan IV 300 mg/m2 administered X 2 doses only in addition to rev/dex      11/09/2015 - 06/08/2016 Chemotherapy    Revlimid/Dexamethasone       02/05/2017 - 07/09/2017 Chemotherapy    The patient had bortezomib SQ (VELCADE) chemo injection 1.75 mg, 1 mg/m2 = 1.75 mg (66.7 % of original dose 1.5 mg/m2), Subcutaneous,  Once, 1 of 8 cycles Dose modification: 1.5 mg/m2 (original dose 1.5 mg/m2, Cycle 1, Reason: Provider Judgment, Comment: Per Dr. Norma Fredrickson recommendations from [facility] 1 mg/m2 (original dose 1.5 mg/m2, Cycle 1, Reason: Provider Judgment)  for chemotherapy treatment.        07/09/2017 Adverse Reaction    Diarrhea       Chemotherapy    Pomalyst 2 mg (Days 1-21 every 28 days), Ixazomib 2.3 mg (days 1, 8, 15 every 28 days), and Dexamethasone 10 mg (weekly)- Rx's printed on 08/24/2017.  Treatment recommendations from Dr. Norma Fredrickson at [facility].        CANCER STAGING: Cancer Staging No matching staging information was found for the patient.   INTERVAL HISTORY:  Ms. Mcquigg 77 y.o. female returns for routine follow-up and consideration for next cycle of chemotherapy.   Due for  cycle 4 today. She has tolerated the pomalidomide and alternating dexamethasone every other week. States she is having Sob with exertion starting in the middle of her cycles. She has mild fatigue but improved since receiving iron. She is experiencing numbness in her feet. Patient had normal ECHO and Stress test on 01/01/18. Patient denies nausea, vomiting or diarrhea. Denies any fevers or infections.  Overall, she tells me she has been feeling pretty well. Energy levels 75%; and appetite is 75%.     REVIEW OF SYSTEMS:  Review of Systems  Constitutional: Positive for fatigue.  Respiratory: Positive for shortness of breath.   Neurological: Positive for dizziness, extremity weakness and numbness (feet).     PAST MEDICAL/SURGICAL HISTORY:  Past Medical History:  Diagnosis Date  . 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 CAD (coronary artery disease)   . COPD (chronic obstructive pulmonary disease) ([facility])   . History of tobacco abuse   . Hyperlipidemia   . Hypertension   . 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 Multiple myeloma ([facility])   . Vitamin B12 deficiency 04/15/2016   Overview:  Vitamin B12 level documented 155, January 2016 with the normal range being 211-924   Past Surgical History:  Procedure Laterality Date  . BACK SURGERY    . CARDIAC CATHETERIZATION  04/2011   right and left cath showing normal right heart pressures,but newly diagnosed coronary artery disease/drug  eluting stent placed to RCA with residual disease in the proximal RCA and LAD and ramus, normal LV function and 60-65% EF  . COLONOSCOPY N/A 09/30/2014   Procedure: COLONOSCOPY;  Surgeon: Rogene Houston, MD;  Location: [facility];  Service: Endoscopy;  Laterality: N/A;  225  . CORONARY ANGIOPLASTY WITH STENT PLACEMENT  04/2011   mid RCA: 3.0 X38 mm Promus DES. Residual 40% disease proximally  . NECK SURGERY       SOCIAL HISTORY:  Social History   Socioeconomic History  .  Marital status: Widowed    Spouse name: Not on file  . Number of children: Not on file  . Years of education: Not on file  . Highest education level: Not on file  Occupational History  . 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 Financial resource strain: Not on file  . Food insecurity:    Worry: Not on file    Inability: Not on file  . Transportation needs:    Medical: Not on file    Non-medical: Not on file  Tobacco Use  . Smoking status: Former Smoker    Packs/day: 3.00    Years: 25.00    Pack years: 75.00    Types: Cigarettes    Last attempt to quit: 07/10/1994    Years since quitting: 23.5  . Smokeless tobacco: Never Used  Substance and Sexual Activity  . Alcohol use: No  . Drug use: Not on file  . Sexual activity: Not on file  Lifestyle  . Physical activity:    Days per week: Not on file    Minutes per session: Not on file  . Stress: Not on file  Relationships  . Social connections:    Talks on phone: Not on file    Gets together: Not on file    Attends religious service: Not on file    Active member of club or organization: Not on file    Attends meetings of clubs or organizations: Not on file    Relationship status: Not on file  . Intimate partner violence:    Fear of current or ex partner: Not on file    Emotionally abused: Not on file    Physically abused: Not on file    Forced sexual activity: Not on file  Other Topics Concern  . Not on file  Social History Narrative  . Not on file    FAMILY HISTORY:  Family History  Problem Relation Age of Onset  . Heart failure Mother   . Cancer Father     CURRENT MEDICATIONS:  Outpatient Encounter Medications as of 01/07/2018  Medication Sig  . acyclovir (ZOVIRAX) 200 MG capsule TAKE ONE CAPSULE BY MOUTH TWICE DAILY  . aspirin EC 81 MG tablet Take 1 tablet (81 mg total) by mouth daily.  Marland Kitchen atorvastatin (LIPITOR) 10 MG tablet TAKE ONE TABLET BY MOUTH DAILY  . Calcium Carbonate-Vit  D-Min (CALCIUM 600+D PLUS MINERALS) 600-400 MG-UNIT CHEW Chew 1 tablet 2 (two) times daily by mouth.   . cyanocobalamin (,VITAMIN B-12,) 1000 MCG/ML injection Inject 1 mL (1,000 mcg total) into the muscle every 30 (thirty) days.  Marland Kitchen dexamethasone (DECADRON) 2 MG tablet Take 10 mg PO weekly  . diazepam (VALIUM) 5 MG tablet Take 1 tablet (5 mg total) by mouth every 6 (six) hours as needed for anxiety.  Marland Kitchen diltiazem (CARDIZEM CD) 240 MG 24 hr capsule TAKE ONE CAPSULE BY MOUTH DAILY  . guaiFENesin (  MUCINEX) 600 MG 12 hr tablet Take 600 mg 2 (two) times daily as needed by mouth for cough or to loosen phlegm.  Marland Kitchen losartan (COZAAR) 50 MG tablet Take 1 tablet (50 mg total) by mouth daily. (Patient taking differently: Take 50 mg 2 (two) times daily by mouth. )  . magnesium oxide (MAG-OX) 400 (241.3 Mg) MG tablet TAKE ONE TABLET BY MOUTH TWICE DAILY. WHEN TAKING LASIX  . metoprolol tartrate (LOPRESSOR) 25 MG tablet TAKE ONE TABLET BY MOUTH TWICE DAILY  . NINLARO 2.3 MG capsule Take 1 capsule by mouth on days 1, 8, and 15 every 28 days. Take on an empty stomach.  . ondansetron (ZOFRAN) 8 MG tablet Take 1 tablet (8 mg total) by mouth every 8 (eight) hours as needed for nausea or vomiting.  . pomalidomide (POMALYST) 2 MG capsule Take 1 capsule (2 mg total) by mouth daily. Take with water on days 1-21. Repeat every 28 days.  . potassium chloride (K-DUR,KLOR-CON) 10 MEQ tablet Take 1 tablet (10 mEq total) by mouth 3 (three) times daily.   Facility-Administered Encounter Medications as of 01/07/2018  Medication  . cyanocobalamin ((VITAMIN B-12)) injection 1,000 mcg    ALLERGIES:  Allergies  Allergen Reactions  . Lisinopril Cough  . Plavix [Clopidogrel Bisulfate] Swelling     PHYSICAL EXAM:  ECOG Performance status: 1  Vitals:   01/07/18 1020  BP: 124/64  Pulse: 66  Resp: 16  Temp: 97.6 F (36.4 C)  SpO2: 96%   Filed Weights   01/07/18 1020  Weight: 152 lb (68.9 kg)    Physical  Exam   LABORATORY DATA:  I have reviewed the labs as listed.  CBC    Component Value Date/Time   WBC 12.6 (H) 01/07/2018 0958   RBC 3.80 (L) 01/07/2018 0958   HGB 12.1 01/07/2018 0958   HCT 38.8 01/07/2018 0958   PLT 248 01/07/2018 0958   MCV 102.1 (H) 01/07/2018 0958   MCH 31.8 01/07/2018 0958   MCHC 31.2 01/07/2018 0958   RDW 20.4 (H) 01/07/2018 0958   LYMPHSABS 1.1 01/07/2018 0958   MONOABS 3.5 (H) 01/07/2018 0958   EOSABS 0.4 01/07/2018 0958   BASOSABS 0.1 01/07/2018 0958   CMP Latest Ref Rng & Units 01/07/2018 12/24/2017 12/24/2017  Glucose 70 - 99 mg/dL 103(H) - 84  BUN 8 - 23 mg/dL 21 - 27(H)  Creatinine 0.44 - 1.00 mg/dL 1.02(H) - 1.17(H)  Sodium 135 - 145 mmol/L 139 - 139  Potassium 3.5 - 5.1 mmol/L 4.2 - 4.0  Chloride 98 - 111 mmol/L 106 - 105  CO2 22 - 32 mmol/L 27 - 25  Calcium 8.9 - 10.3 mg/dL 8.4(L) - 8.5(L)  Total Protein 6.5 - 8.1 g/dL 6.6 5.8(L) 6.3(L)  Total Bilirubin 0.3 - 1.2 mg/dL 0.6 0.4 0.7  Alkaline Phos 38 - 126 U/L 83 69 68  AST 15 - 41 U/L '15 17 19  '$ ALT 0 - 44 U/L '16 16 14           '$ ASSESSMENT & PLAN:   Multiple myeloma ([facility]) 1. 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 gain, -13 -Status post Velcade and dexamethasone from 03/18/2015 through 07/28/2015, Velcade discontinued secondary to neuropathy - Revlimid and dexamethasone from February 2017 through November 2017, achieving negative SPEP and immunofixation -Seen by Dr. Norma Fredrickson for  second opinion, bone marrow biopsy on 11/14/2016 shows less than 2% plasma cells, maintenance Velcade started in May 2018, held in January 2019 due to diarrhea -Noted to have biochemical relapse with M spike of 0.5, recommended to start on reduced dose pomalidomide, Ninlaro and dexamethasone -Started first cycle of Pomalyst 2 mg (days 1-21  every 28 days), ixazomib 2.3 mg (days 1, 8, 15 every 28 days), and Dexamethasone 10 mg (weekly) on 08/24/2017 due to biochemical relapse.  She has tolerated it reasonably well although she felt somewhat tired.  She felt severely tired after cycle 2.  Ninlaro was held during third cycle due to excessive tiredness.  Cycle 4 of pomalidomide and dexamethasone started on 11/26/2017.  She is tolerating it very well without any side effects.  Her M spike on 11/23/2017 has come down to 0.2 from 0.4 on 10/29/2017.  Light chain ratio is stable at 2.4.  Kappa light chains have come down from 83-46. - She was seen by Dr. Norma Fredrickson on 12/10/2017 and was recommended to cut back on dexamethasone 10 mg to every other week.  She also underwent cardiology evaluation for work-up of shortness of breath which were negative.  I discussed the results of the M spike on 12/24/2017 which went up slightly to 0.3 g/dL.  Free light chain ratio was more or less stable at 2.23.  We will see her back in 5 weeks with repeat SPEP and free light chain assay.  2.  Bone protection: She is receiving zoledronic acid once every 3 months.  Last zoledronic acid 3 mg was on 10/29/2017.  3.  Peripheral neuropathy: She has numbness in the feet which has been stable.  This is from prior Velcade exposure.  4.  Normocytic anemia: This is from underlying chronic kidney disease and iron deficiency.  She received 2 weekly Feraheme infusions on 11/23/2017 and 12/05/2017.  Her hemoglobin improved to 12.2.  She feels much better in terms of tiredness.         Orders placed this encounter:  Orders Placed This Encounter  Procedures  . Protein electrophoresis, serum  . Kappa/lambda light chains  . CBC with Differential/Platelet  . Comprehensive metabolic panel      Derek Jack, MD [place] [phone]

## 2018-01-07 NOTE — Progress Notes (Signed)
Pt here today for Aranesp and B12 injection. Pt not given Aranesp today due to Hgb 12.1. Pt given B12 injection in left deltoid. Pt tolerated injection well with no complaints. Pt stable and discharged home ambulatory. Pt to return as scheduled for next B12 injection or Aranesp injection.

## 2018-01-07 NOTE — Patient Instructions (Signed)
[facility] at [facility] Discharge Instructions  You saw Dr. Katragadda today.   Thank you for choosing [facility] at [facility] to provide your oncology and hematology care.  To afford each patient quality time with our provider, please arrive at least 15 minutes before your scheduled appointment time.   If you have a lab appointment with the Cancer Center please come in thru the  Main Entrance and check in at the main information desk  You need to re-schedule your appointment should you arrive 10 or more minutes late.  We strive to give you quality time with our providers, and arriving late affects you and other patients whose appointments are after yours.  Also, if you no show three or more times for appointments you may be dismissed from the clinic at the providers discretion.     Again, thank you for choosing [facility].  Our hope is that these requests will decrease the amount of time that you wait before being seen by our physicians.       _____________________________________________________________  Should you have questions after your visit to [facility], please contact our office at [phone] between the hours of 8:30 a.m. and 4:30 p.m.  Voicemails left after 4:30 p.m. will not be returned until the following business day.  For prescription refill requests, have your pharmacy contact our office.       Resources For Cancer Patients and their Caregivers ? American Cancer Society: Can assist with transportation, wigs, general needs, runs Look Good Feel Better.        1-888-227-6333 ? Cancer Care: Provides financial assistance, online support groups, medication/co-pay assistance.  1-800-813-HOPE (4673) ? [facility] Assists [place] cancer patients and their families through emotional , educational and financial support.  336-427-4357 ? [place] DSS Where to apply for  food stamps, Medicaid and utility assistance. 336-342-1394 ? RCATS: Transportation to medical appointments. 336-347-2287 ? Social Security Administration: May apply for disability if have a Stage IV cancer. 336-342-7796 1-800-772-1213 ? [place] Aging, Disability and Transit Services: Assists with nutrition, care and transit needs. 336-349-2343  Cancer Center Support Programs:   > Cancer Support Group  2nd Tuesday of the month 1pm-2pm, Journey Room   > Creative Journey  3rd Tuesday of the month 1130am-1pm, Journey Room     

## 2018-01-09 NOTE — Assessment & Plan Note (Signed)
1. 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 gain, -13 -Status post Velcade and dexamethasone from 03/18/2015 through 07/28/2015, Velcade discontinued secondary to neuropathy - Revlimid and dexamethasone from February 2017 through November 2017, achieving negative SPEP and immunofixation -Seen by Dr. Norma Fredrickson for second opinion, bone marrow biopsy on 11/14/2016 shows less than 2% plasma cells, maintenance Velcade started in May 2018, held in January 2019 due to diarrhea -Noted to have biochemical relapse with M spike of 0.5, recommended to start on reduced dose pomalidomide, Ninlaro and dexamethasone -Started first cycle of Pomalyst 2 mg (days 1-21 every 28 days), ixazomib 2.3 mg (days 1, 8, 15 every 28 days), and Dexamethasone 10 mg (weekly) on 08/24/2017 due to biochemical relapse.  She has tolerated it reasonably well although she felt somewhat tired.  She felt severely tired after cycle 2.  Ninlaro was held during third cycle due to excessive tiredness.  Cycle 4 of pomalidomide and dexamethasone started on 11/26/2017.  She is tolerating it very well without any side effects.  Her M spike on 11/23/2017 has come down to 0.2 from 0.4 on 10/29/2017.  Light chain ratio is stable at 2.4.  Kappa light chains have come down from 83-46. - She was seen by Dr. Norma Fredrickson on 12/10/2017 and was recommended to cut back on dexamethasone 10 mg to every other week.  She also underwent cardiology evaluation for work-up of shortness of breath which were negative.  I discussed the results of the M spike on 12/24/2017 which went up slightly to 0.3 g/dL.  Free light chain ratio was more or less stable at 2.23.  We will see her back in 5 weeks with repeat SPEP and free light chain assay.  2.  Bone protection: She is receiving zoledronic acid once every 3 months.   Last zoledronic acid 3 mg was on 10/29/2017.  3.  Peripheral neuropathy: She has numbness in the feet which has been stable.  This is from prior Velcade exposure.  4.  Normocytic anemia: This is from underlying chronic kidney disease and iron deficiency.  She received 2 weekly Feraheme infusions on 11/23/2017 and 12/05/2017.  Her hemoglobin improved to 12.2.  She feels much better in terms of tiredness.

## 2018-01-14 ENCOUNTER — Other Ambulatory Visit (HOSPITAL_COMMUNITY): Payer: Self-pay | Admitting: *Deleted

## 2018-01-14 DIAGNOSIS — C9000 Multiple myeloma not having achieved remission: Secondary | ICD-10-CM

## 2018-01-14 MED ORDER — POMALIDOMIDE 2 MG PO CAPS: 2.0000 mg | ORAL_CAPSULE | Freq: Every day | ORAL | 0 refills | Status: DC

## 2018-01-14 NOTE — Telephone Encounter (Signed)
Last office note reviewed, Pomalyst refilled

## 2018-01-16 ENCOUNTER — Other Ambulatory Visit (HOSPITAL_COMMUNITY): Payer: Self-pay | Admitting: *Deleted

## 2018-01-16 MED ORDER — DEXAMETHASONE 2 MG PO TABS: ORAL_TABLET | ORAL | 3 refills | Status: DC

## 2018-01-16 NOTE — Progress Notes (Signed)
Chart reviewed, per last note by Dr. Delton Coombes patient is taking medication every other week.  Dexamethasone ordered to reflect this change.

## 2018-01-21 ENCOUNTER — Inpatient Hospital Stay (HOSPITAL_COMMUNITY): Payer: Medicare Other

## 2018-01-21 ENCOUNTER — Encounter (HOSPITAL_COMMUNITY): Payer: Self-pay

## 2018-01-21 DIAGNOSIS — N183 Chronic kidney disease, stage 3 unspecified: Secondary | ICD-10-CM

## 2018-01-21 DIAGNOSIS — E538 Deficiency of other specified B group vitamins: Secondary | ICD-10-CM

## 2018-01-21 DIAGNOSIS — D631 Anemia in chronic kidney disease: Secondary | ICD-10-CM

## 2018-01-21 DIAGNOSIS — C9000 Multiple myeloma not having achieved remission: Secondary | ICD-10-CM | POA: Diagnosis not present

## 2018-01-21 DIAGNOSIS — D509 Iron deficiency anemia, unspecified: Secondary | ICD-10-CM

## 2018-01-21 LAB — CBC WITH DIFFERENTIAL/PLATELET
Basophils Absolute: 0.1 10*3/uL (ref 0.0–0.1)
Basophils Relative: 2 %
EOS PCT: 2 %
Eosinophils Absolute: 0.1 10*3/uL (ref 0.0–0.7)
HCT: 35.4 % — ABNORMAL LOW (ref 36.0–46.0)
Hemoglobin: 11.3 g/dL — ABNORMAL LOW (ref 12.0–15.0)
LYMPHS ABS: 1.5 10*3/uL (ref 0.7–4.0)
Lymphocytes Relative: 21 %
MCH: 31.3 pg (ref 26.0–34.0)
MCHC: 31.9 g/dL (ref 30.0–36.0)
MCV: 98.1 fL (ref 78.0–100.0)
MONO ABS: 1.3 10*3/uL — AB (ref 0.1–1.0)
Monocytes Relative: 18 %
NEUTROS ABS: 4.3 10*3/uL (ref 1.7–7.7)
Neutrophils Relative %: 57 %
PLATELETS: 217 10*3/uL (ref 150–400)
RBC: 3.61 MIL/uL — ABNORMAL LOW (ref 3.87–5.11)
RDW: 18.4 % — AB (ref 11.5–15.5)
WBC: 7.3 10*3/uL (ref 4.0–10.5)

## 2018-01-21 LAB — COMPREHENSIVE METABOLIC PANEL
ALT: 20 U/L (ref 0–44)
ANION GAP: 6 (ref 5–15)
AST: 21 U/L (ref 15–41)
Albumin: 3.2 g/dL — ABNORMAL LOW (ref 3.5–5.0)
Alkaline Phosphatase: 72 U/L (ref 38–126)
BUN: 22 mg/dL (ref 8–23)
CO2: 25 mmol/L (ref 22–32)
Calcium: 8.4 mg/dL — ABNORMAL LOW (ref 8.9–10.3)
Chloride: 104 mmol/L (ref 98–111)
Creatinine, Ser: 1.21 mg/dL — ABNORMAL HIGH (ref 0.44–1.00)
GFR, EST AFRICAN AMERICAN: 49 mL/min — AB (ref >60)
GFR, EST NON AFRICAN AMERICAN: 42 mL/min — AB (ref >60)
Glucose, Bld: 102 mg/dL — ABNORMAL HIGH (ref 70–99)
POTASSIUM: 3.7 mmol/L (ref 3.5–5.1)
Sodium: 135 mmol/L (ref 135–145)
TOTAL PROTEIN: 6.3 g/dL — AB (ref 6.5–8.1)
Total Bilirubin: 0.4 mg/dL (ref 0.3–1.2)

## 2018-01-21 MED ORDER — SODIUM CHLORIDE 0.9 % IV SOLN
INTRAVENOUS | Status: DC
  Administered 2018-01-21: 14:00:00 via INTRAVENOUS

## 2018-01-21 MED ORDER — ZOLEDRONIC ACID 4 MG/5ML IV CONC
3.0000 mg | Freq: Once | INTRAVENOUS | Status: AC
  Administered 2018-01-21: 3 mg via INTRAVENOUS
  Filled 2018-01-21: qty 3.75

## 2018-01-21 NOTE — Progress Notes (Signed)
Carol Bradley tolerated Zometa infusion well without incident or complaint. VSS upon completion of treatment. Discharged self ambulatory in satisfactory condition in company of friend.

## 2018-01-21 NOTE — Patient Instructions (Signed)
[facility] at [facility] Discharge Instructions  Received Zometa infusion today. Follow-up as scheduled. Call clinic for any questions or concerns   Thank you for choosing [facility] at [facility] to provide your oncology and hematology care.  To afford each patient quality time with our provider, please arrive at least 15 minutes before your scheduled appointment time.   If you have a lab appointment with the [facility] please come in thru the  Main Entrance and check in at the main information desk  You need to re-schedule your appointment should you arrive 10 or more minutes late.  We strive to give you quality time with our providers, and arriving late affects you and other patients whose appointments are after yours.  Also, if you no show three or more times for appointments you may be dismissed from the clinic at the providers discretion.     Again, thank you for choosing [facility].  Our hope is that these requests will decrease the amount of time that you wait before being seen by our physicians.       _____________________________________________________________  Should you have questions after your visit to [facility], please contact our office at [phone] between the hours of 8:30 a.m. and 4:30 p.m.  Voicemails left after 4:30 p.m. will not be returned until the following business day.  For prescription refill requests, have your pharmacy contact our office.       Resources For Cancer Patients and their Caregivers ? American Cancer Society: Can assist with transportation, wigs, general needs, runs Look Good Feel Better.        220-481-2681 ? Cancer Care: Provides financial assistance, online support groups, medication/co-pay assistance.  1-800-813-HOPE 8137337386) ? [facility] Assists [place] cancer patients and their families through emotional , educational and  financial support.  915-743-8436 ? [place] DSS Where to apply for food stamps, Medicaid and utility assistance. 253-073-8708 ? RCATS: Transportation to medical appointments. (774)611-0519 ? Social Security Administration: May apply for disability if have a Stage IV cancer. 586 742 8442 (337) 566-0864 ? [employer], Disability and Transit Services: Assists with nutrition, care and transit needs. [place] Support Programs:   > Cancer Support Group  2nd Tuesday of the month 1pm-2pm, Journey Room   > Creative Journey  3rd Tuesday of the month 1130am-1pm, Journey Room

## 2018-01-21 NOTE — Progress Notes (Signed)
1350 Labs reviewed and Hgb 11.3 so Aranesp injection held per parameters. Corrected Calcium 9.04 and pt denied any tooth or jaw pain and no recent or future dental visits. Pt continues to take her Calcium and Ninlaro as prescribed without issues.

## 2018-02-03 IMAGING — DX DG CHEST 2V
2 series · 2 of 2 positions shown · non-contrast
Comparison: 10/03/2016.

CLINICAL DATA: Irregular heart rate and shortness of breath this
morning. Undergoing chemotherapy for multiple myeloma.

EXAM:
CHEST  2 VIEW

[chest pa]
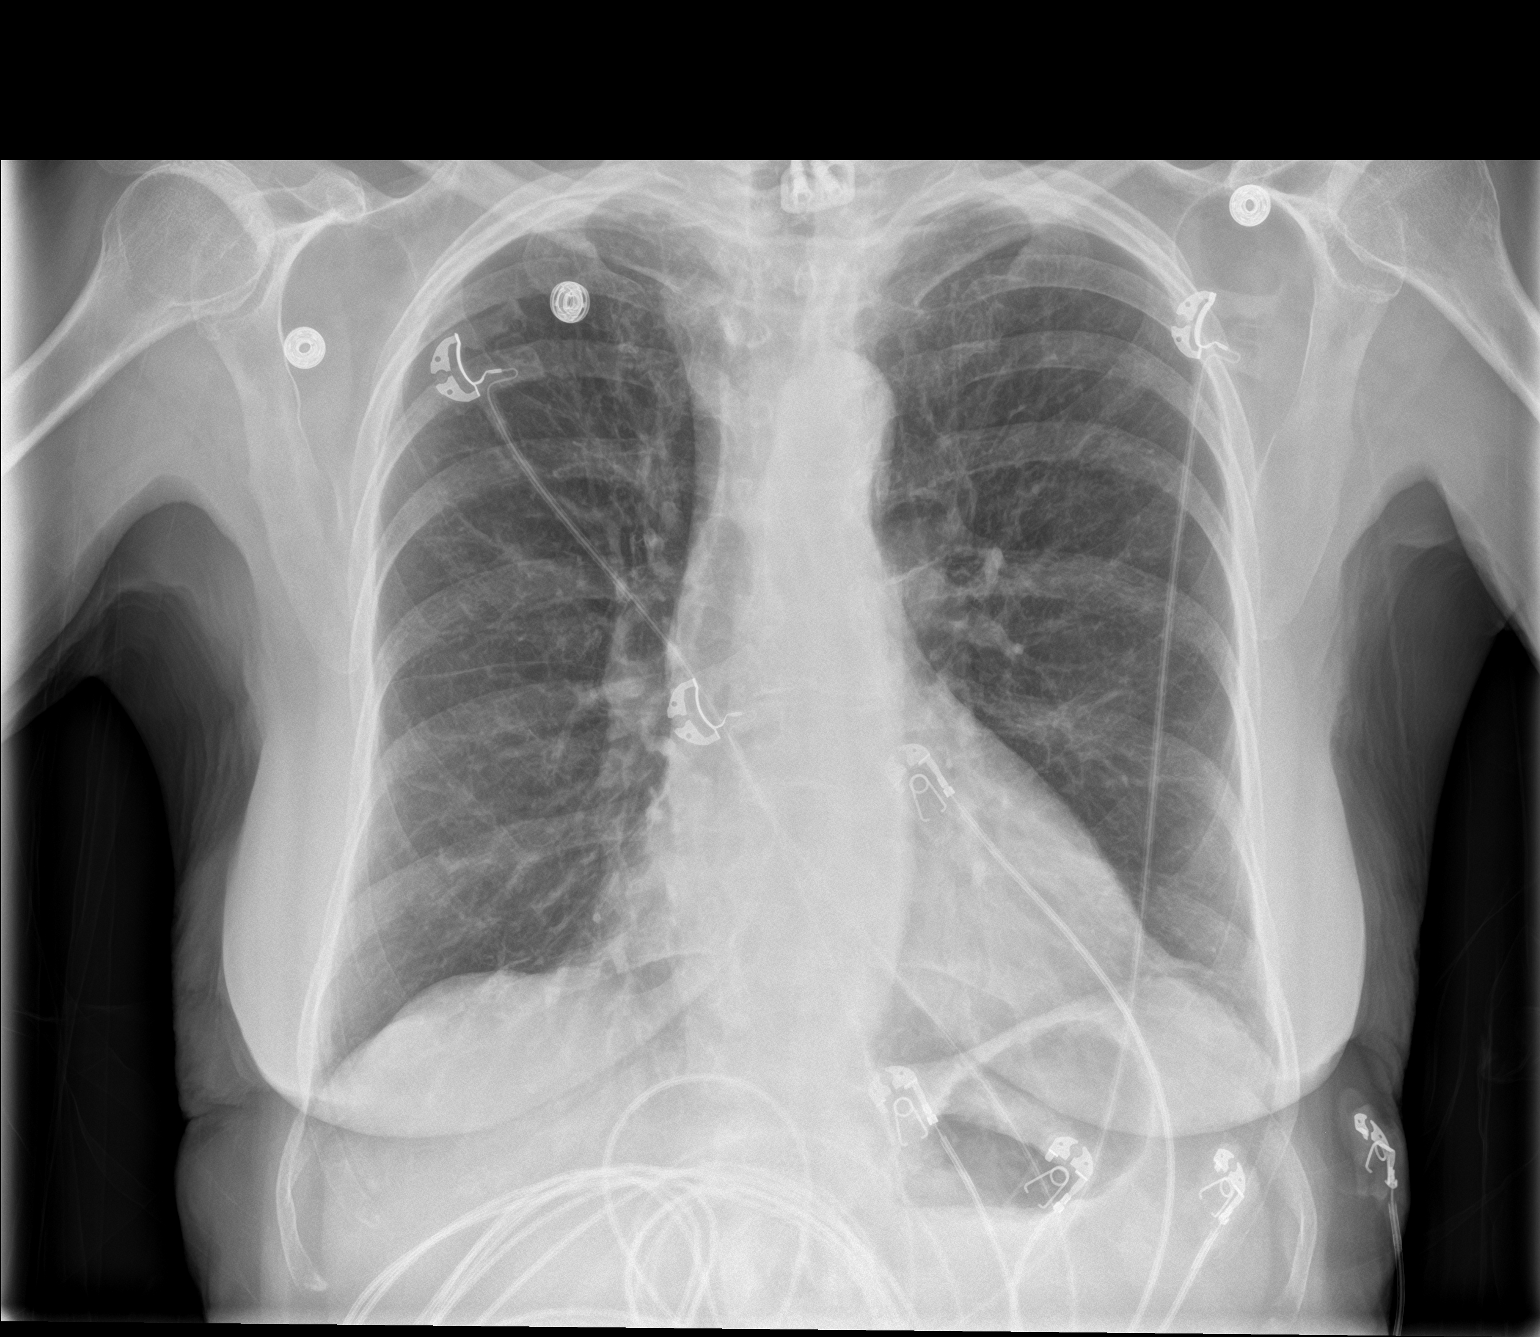

[chest lat]
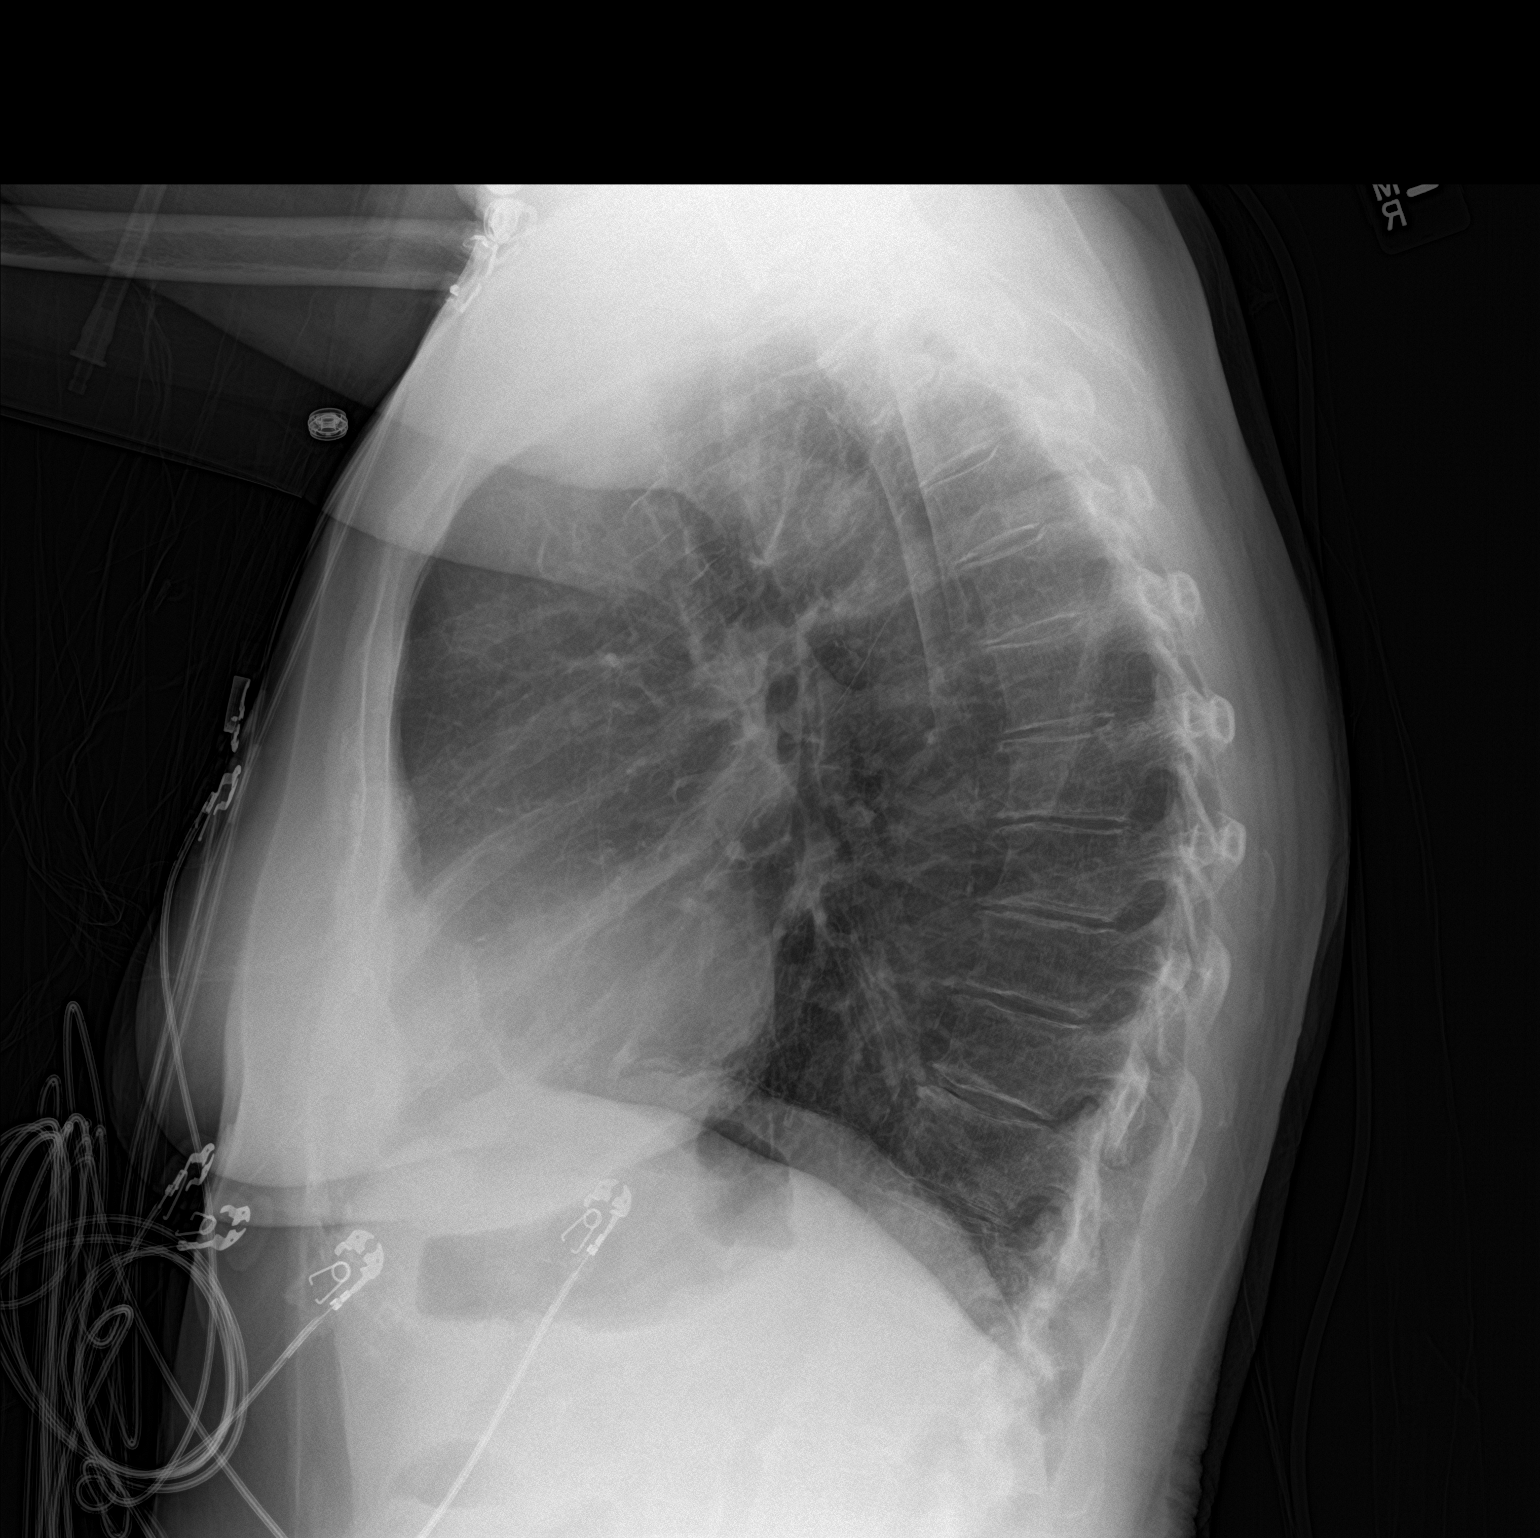

[2 of 2 positions shown; findings below may reference images not displayed]

FINDINGS: Normal sized heart. The lungs remain clear and hyperexpanded with
mild diffuse peribronchial thickening. Mild thoracic spine
degenerative changes. Cervical spine fixation hardware.
IMPRESSION: No acute abnormality. Stable mild changes of COPD and chronic
bronchitis.

## 2018-02-04 ENCOUNTER — Inpatient Hospital Stay (HOSPITAL_COMMUNITY): Payer: Medicare Other

## 2018-02-04 DIAGNOSIS — C9000 Multiple myeloma not having achieved remission: Secondary | ICD-10-CM

## 2018-02-04 LAB — CBC WITH DIFFERENTIAL/PLATELET
Basophils Absolute: 0.1 10*3/uL (ref 0.0–0.1)
Basophils Relative: 1 %
EOS ABS: 0.3 10*3/uL (ref 0.0–0.7)
Eosinophils Relative: 3 %
HCT: 34.5 % — ABNORMAL LOW (ref 36.0–46.0)
HEMOGLOBIN: 11.1 g/dL — AB (ref 12.0–15.0)
LYMPHS ABS: 1.2 10*3/uL (ref 0.7–4.0)
Lymphocytes Relative: 11 %
MCH: 32.3 pg (ref 26.0–34.0)
MCHC: 32.2 g/dL (ref 30.0–36.0)
MCV: 100.3 fL — AB (ref 78.0–100.0)
Monocytes Absolute: 2.9 10*3/uL — ABNORMAL HIGH (ref 0.1–1.0)
Monocytes Relative: 28 %
NEUTROS ABS: 6.1 10*3/uL (ref 1.7–7.7)
NEUTROS PCT: 57 %
PLATELETS: 243 10*3/uL (ref 150–400)
RBC: 3.44 MIL/uL — AB (ref 3.87–5.11)
RDW: 18 % — ABNORMAL HIGH (ref 11.5–15.5)
WBC: 10.6 10*3/uL — AB (ref 4.0–10.5)

## 2018-02-04 LAB — COMPREHENSIVE METABOLIC PANEL
ALK PHOS: 89 U/L (ref 38–126)
ALT: 28 U/L (ref 0–44)
AST: 24 U/L (ref 15–41)
Albumin: 3.3 g/dL — ABNORMAL LOW (ref 3.5–5.0)
Anion gap: 5 (ref 5–15)
BUN: 23 mg/dL (ref 8–23)
CHLORIDE: 107 mmol/L (ref 98–111)
CO2: 27 mmol/L (ref 22–32)
Calcium: 8.4 mg/dL — ABNORMAL LOW (ref 8.9–10.3)
Creatinine, Ser: 1.2 mg/dL — ABNORMAL HIGH (ref 0.44–1.00)
GFR calc Af Amer: 49 mL/min — ABNORMAL LOW (ref >60)
GFR calc non Af Amer: 42 mL/min — ABNORMAL LOW (ref >60)
Glucose, Bld: 107 mg/dL — ABNORMAL HIGH (ref 70–99)
Potassium: 4.3 mmol/L (ref 3.5–5.1)
SODIUM: 139 mmol/L (ref 135–145)
Total Bilirubin: 0.8 mg/dL (ref 0.3–1.2)
Total Protein: 6.4 g/dL — ABNORMAL LOW (ref 6.5–8.1)

## 2018-02-04 NOTE — Progress Notes (Signed)
Aranesp held today per parameters. Hgb 11.1. Patient to follow up with Dr. Delton Coombes next week as previously scheduled. Discharged self ambulatory in satisfactory condition.

## 2018-02-05 ENCOUNTER — Other Ambulatory Visit: Payer: Self-pay | Admitting: Cardiology

## 2018-02-05 LAB — KAPPA/LAMBDA LIGHT CHAINS
KAPPA FREE LGHT CHN: 90.2 mg/L — AB (ref 3.3–19.4)
Kappa, lambda light chain ratio: 2.96 — ABNORMAL HIGH (ref 0.26–1.65)
Lambda free light chains: 30.5 mg/L — ABNORMAL HIGH (ref 5.7–26.3)

## 2018-02-05 LAB — PROTEIN ELECTROPHORESIS, SERUM
A/G Ratio: 1.1 (ref 0.7–1.7)
Albumin ELP: 3.1 g/dL (ref 2.9–4.4)
Alpha-1-Globulin: 0.3 g/dL (ref 0.0–0.4)
Alpha-2-Globulin: 1 g/dL (ref 0.4–1.0)
BETA GLOBULIN: 0.8 g/dL (ref 0.7–1.3)
GAMMA GLOBULIN: 0.8 g/dL (ref 0.4–1.8)
Globulin, Total: 2.8 g/dL (ref 2.2–3.9)
M-SPIKE, %: 0.3 g/dL — AB
TOTAL PROTEIN ELP: 5.9 g/dL — AB (ref 6.0–8.5)

## 2018-02-06 ENCOUNTER — Telehealth (HOSPITAL_COMMUNITY): Payer: Self-pay | Admitting: *Deleted

## 2018-02-06 NOTE — Telephone Encounter (Signed)
Pt states that she has a sinus infection. Pt states that she went ahead and saw her PCP and they gave her an antibiotic.

## 2018-02-11 ENCOUNTER — Encounter (HOSPITAL_COMMUNITY): Payer: Self-pay | Admitting: Hematology

## 2018-02-11 ENCOUNTER — Inpatient Hospital Stay (HOSPITAL_COMMUNITY): Payer: Medicare Other | Attending: Hematology | Admitting: Hematology

## 2018-02-11 ENCOUNTER — Other Ambulatory Visit (HOSPITAL_COMMUNITY): Payer: Self-pay | Admitting: *Deleted

## 2018-02-11 ENCOUNTER — Other Ambulatory Visit: Payer: Self-pay

## 2018-02-11 VITALS — BP 142/38 | HR 70 | Temp 97.8°F | Resp 20 | Wt 157.4 lb

## 2018-02-11 DIAGNOSIS — N189 Chronic kidney disease, unspecified: Secondary | ICD-10-CM | POA: Insufficient documentation

## 2018-02-11 DIAGNOSIS — G620 Drug-induced polyneuropathy: Secondary | ICD-10-CM | POA: Diagnosis not present

## 2018-02-11 DIAGNOSIS — D509 Iron deficiency anemia, unspecified: Secondary | ICD-10-CM | POA: Insufficient documentation

## 2018-02-11 DIAGNOSIS — D631 Anemia in chronic kidney disease: Secondary | ICD-10-CM | POA: Insufficient documentation

## 2018-02-11 DIAGNOSIS — C9000 Multiple myeloma not having achieved remission: Secondary | ICD-10-CM

## 2018-02-11 DIAGNOSIS — Z9221 Personal history of antineoplastic chemotherapy: Secondary | ICD-10-CM | POA: Diagnosis not present

## 2018-02-11 MED ORDER — POMALIDOMIDE 2 MG PO CAPS: 2.0000 mg | ORAL_CAPSULE | Freq: Every day | ORAL | 0 refills | Status: DC

## 2018-02-11 NOTE — Progress Notes (Signed)
Chart reviewed, pomalyst refilled.  

## 2018-02-11 NOTE — Progress Notes (Signed)
[place], [place] [address]   CLINIC:  Medical Oncology/Hematology  PCP:  [name], MD [address] 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       03/18/2015 - 07/28/2015 Chemotherapy    Velcade 1.6 mg/m2 discontinued secondary to intolerance      07/28/2015 Adverse Reaction    stool incontinence, weakness, collapse upon standing, felt to be secondary to velcade      11/09/2015 - 12/13/2015 Chemotherapy    cytoxan IV 300 mg/m2 administered X 2 doses only in addition to rev/dex      11/09/2015 - 06/08/2016 Chemotherapy    Revlimid/Dexamethasone       02/05/2017 - 07/09/2017 Chemotherapy    The patient had bortezomib SQ (VELCADE) chemo injection 1.75 mg, 1 mg/m2 = 1.75 mg (66.7 % of original dose 1.5 mg/m2), Subcutaneous,  Once, 1 of 8 cycles Dose modification: 1.5 mg/m2 (original dose 1.5 mg/m2, Cycle 1, Reason: Provider Judgment, Comment: Per Dr. Norma Fredrickson recommendations from [facility] 1 mg/m2 (original dose 1.5 mg/m2, Cycle 1, Reason: Provider Judgment)  for chemotherapy treatment.        07/09/2017 Adverse Reaction    Diarrhea       Chemotherapy    Pomalyst 2 mg (Days 1-21 every 28 days), Ixazomib 2.3 mg (days 1, 8, 15 every 28 days), and Dexamethasone 10 mg (weekly)- Rx's printed on 08/24/2017.  Treatment recommendations from Dr. Norma Fredrickson at [facility].        INTERVAL HISTORY:  Ms. Julin 77 y.o. female returns for routine follow-up multiple myeloma. Patient is here today stating she has been feeling bad. Very fatigued with no energy. Patient states this is her off week with her  medication. She feels weak during the day. Patient said she is SOB every time she take the Ninlaro. She did not take her 3rd dose this month due to that. The SOB last 2-3 days after mostly with exertion but also sitting. She has this is the worst she has felt in a while. Patient appetite remains good at 75% and she is maintaining her weight. She is also drinking ensure daily. Patient energy levels are low at 25%.     REVIEW OF SYSTEMS:  Review of Systems  Constitutional: Positive for fatigue.  HENT:  Negative.   Eyes: Negative.   Respiratory: Positive for shortness of breath.   Cardiovascular: Negative.   Gastrointestinal: Negative.   Endocrine: Negative.   Genitourinary: Negative.    Skin: Negative.   Neurological: Positive for extremity weakness.  Hematological: Negative.   Psychiatric/Behavioral: Negative.      PAST MEDICAL/SURGICAL HISTORY:  Past Medical History:  Diagnosis Date  . 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 CAD (coronary artery disease)   . COPD (chronic obstructive pulmonary disease) ([facility])   . History of tobacco abuse   . Hyperlipidemia   . Hypertension   . 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 Multiple myeloma ([place])   . Vitamin B12 deficiency 04/15/2016   Overview:  Vitamin B12 level documented 155, January 2016 with the normal range being 211-924   Past Surgical History:  Procedure Laterality Date  . BACK SURGERY    . CARDIAC CATHETERIZATION  04/2011   right and left cath showing normal right heart pressures,but newly diagnosed coronary artery disease/drug eluting stent placed to RCA with residual disease in the proximal RCA and LAD and ramus, normal LV function and 60-65% EF  . COLONOSCOPY N/A 09/30/2014   Procedure: COLONOSCOPY;  Surgeon: Rogene Houston, MD;  Location: [facility];  Service: Endoscopy;  Laterality: N/A;  225  . CORONARY ANGIOPLASTY WITH STENT PLACEMENT  04/2011   mid RCA: 3.0 X38 mm Promus DES.  Residual 40% disease proximally  . NECK SURGERY       SOCIAL HISTORY:  Social History   Socioeconomic History  . Marital status: Widowed    Spouse name: Not on file  . Number of children: Not on file  . Years of education: Not on file  . Highest education level: Not on file  Occupational History  . 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 Financial resource strain: Not on file  . Food insecurity:    Worry: Not on file    Inability: Not on file  . Transportation needs:    Medical: Not on file    Non-medical: Not on file  Tobacco Use  . Smoking status: Former Smoker    Packs/day: 3.00    Years: 25.00    Pack years: 75.00    Types: Cigarettes    Last attempt to quit: 07/10/1994    Years since quitting: 23.6  . Smokeless tobacco: Never Used  Substance and Sexual Activity  . Alcohol use: No  . Drug use: Not on file  . Sexual activity: Not on file  Lifestyle  . Physical activity:    Days per week: Not on file    Minutes per session: Not on file  . Stress: Not on file  Relationships  . Social connections:    Talks on phone: Not on file    Gets together: Not on file    Attends religious service: Not on file    Active member of club or organization: Not on file    Attends meetings of clubs or organizations: Not on file    Relationship status: Not on file  . Intimate partner violence:    Fear of current or ex partner: Not on file    Emotionally abused: Not on file    Physically abused: Not on file    Forced sexual activity: Not on file  Other Topics Concern  . Not on file  Social History Narrative  . Not on file    FAMILY HISTORY:  Family History  Problem Relation Age of Onset  . Heart failure Mother   . Cancer Father     CURRENT MEDICATIONS:  Outpatient Encounter Medications as of 02/11/2018  Medication Sig  . acyclovir (ZOVIRAX) 200 MG capsule TAKE ONE CAPSULE BY MOUTH TWICE DAILY  . aspirin EC 81 MG tablet Take 1 tablet (81 mg  total) by mouth daily.  Marland Kitchen atorvastatin (LIPITOR) 10 MG tablet TAKE ONE TABLET BY MOUTH DAILY  . Calcium Carbonate-Vit D-Min (CALCIUM 600+D PLUS MINERALS) 600-400 MG-UNIT CHEW Chew 1 tablet by mouth 3 (three) times daily.   . cefUROXime (CEFTIN) 250 MG tablet Take 250 mg by mouth 2 (two) times daily.  . cyanocobalamin (,VITAMIN B-12,) 1000 MCG/ML injection Inject 1 mL (1,000 mcg total) into the muscle every 30 (thirty) days.  Marland Kitchen dexamethasone (DECADRON) 2 MG tablet  Take 10 mg PO every other week  . diazepam (VALIUM) 5 MG tablet Take 1 tablet (5 mg total) by mouth every 6 (six) hours as needed for anxiety.  Marland Kitchen diltiazem (CARDIZEM CD) 240 MG 24 hr capsule TAKE ONE CAPSULE BY MOUTH DAILY  . guaiFENesin (MUCINEX) 600 MG 12 hr tablet Take 600 mg 2 (two) times daily as needed by mouth for cough or to loosen phlegm.  Marland Kitchen losartan (COZAAR) 50 MG tablet Take 1 tablet (50 mg total) by mouth daily. (Patient taking differently: Take 50 mg 2 (two) times daily by mouth. )  . magnesium oxide (MAG-OX) 400 (241.3 Mg) MG tablet TAKE ONE TABLET BY MOUTH TWICE DAILY. WHEN TAKING LASIX (Patient taking differently: Take 400 mg by mouth daily. TAKE ONE TABLET BY MOUTH TWICE DAILY. WHEN TAKING LASIX)  . metoprolol tartrate (LOPRESSOR) 25 MG tablet TAKE ONE TABLET BY MOUTH TWICE DAILY  . ondansetron (ZOFRAN) 8 MG tablet Take 1 tablet (8 mg total) by mouth every 8 (eight) hours as needed for nausea or vomiting.  . pomalidomide (POMALYST) 2 MG capsule Take 1 capsule (2 mg total) by mouth daily. Take with water on days 1-21. Repeat every 28 days.  . potassium chloride (K-DUR,KLOR-CON) 10 MEQ tablet Take 1 tablet (10 mEq total) by mouth 3 (three) times daily. (Patient taking differently: Take 10 mEq by mouth once. )  . [DISCONTINUED] NINLARO 2.3 MG capsule Take 1 capsule by mouth on days 1, 8, and 15 every 28 days. Take on an empty stomach.   Facility-Administered Encounter Medications as of 02/11/2018  Medication  . cyanocobalamin  ((VITAMIN B-12)) injection 1,000 mcg    ALLERGIES:  Allergies  Allergen Reactions  . Lisinopril Cough  . Plavix [Clopidogrel Bisulfate] Swelling     PHYSICAL EXAM:  ECOG Performance status: 1  Vitals:   02/11/18 1353  BP: (!) 142/38  Pulse: 70  Resp: 20  Temp: 97.8 F (36.6 C)  SpO2: 97%   Filed Weights   02/11/18 1353  Weight: 157 lb 6.4 oz (71.4 kg)    Physical Exam  Constitutional: She is oriented to person, place, and time. She appears well-developed and well-nourished.  Cardiovascular: Normal rate, regular rhythm and normal heart sounds.  Pulmonary/Chest: Effort normal and breath sounds normal.  Neurological: She is alert and oriented to person, place, and time.  Skin: Skin is warm and dry.     LABORATORY DATA:  I have reviewed the labs as listed.  CBC    Component Value Date/Time   WBC 10.6 (H) 02/04/2018 1207   RBC 3.44 (L) 02/04/2018 1207   HGB 11.1 (L) 02/04/2018 1207   HCT 34.5 (L) 02/04/2018 1207   PLT 243 02/04/2018 1207   MCV 100.3 (H) 02/04/2018 1207   MCH 32.3 02/04/2018 1207   MCHC 32.2 02/04/2018 1207   RDW 18.0 (H) 02/04/2018 1207   LYMPHSABS 1.2 02/04/2018 1207   MONOABS 2.9 (H) 02/04/2018 1207   EOSABS 0.3 02/04/2018 1207   BASOSABS 0.1 02/04/2018 1207   CMP Latest Ref Rng & Units 02/04/2018 01/21/2018 01/07/2018  Glucose 70 - 99 mg/dL 107(H) 102(H) 103(H)  BUN 8 - 23 mg/dL _0 Creatinine 0.44 - 1.00 mg/dL 1.20(H) 1.21(H) 1.02(H)  Sodium 135 - 145 mmol/L 139 135 139  Potassium 3.5 - 5.1 mmol/L 4.3 3.7 4.2  Chloride 98 - 111 mmol/L 107 104 106  CO2 22 - 32 mmol/L _1 Calcium 8.9 - 10.3 mg/dL 8.4(L) 8.4(L) 8.4(L)  Total Protein 6.5 - 8.1 g/dL 6.4(L) 6.3(L) 6.6  Total Bilirubin 0.3 - 1.2 mg/dL 0.8 0.4 0.6  Alkaline Phos 38 - 126 U/L 89 72 83  AST 15 - 41 U/L 24 21 15  ALT 0 - 44 U/L 28 20 16       ASSESSMENT & PLAN:   Multiple myeloma (HCC) 1. 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 gain, -13 -Status post Velcade and dexamethasone from 03/18/2015 through 07/28/2015, Velcade discontinued secondary to neuropathy - Revlimid and dexamethasone from February 2017 through November 2017, achieving negative SPEP and immunofixation -Seen by Dr. Rodriguez for second opinion, bone marrow biopsy on 11/14/2016 shows less than 2% plasma cells, maintenance Velcade started in May 2018, held in January 2019 due to diarrhea -Noted to have biochemical relapse with M spike of 0.5, recommended to start on reduced dose pomalidomide, Ninlaro and dexamethasone -Started first cycle of Pomalyst 2 mg (days 1-21 every 28 days), ixazomib 2.3 mg (days 1, 8, 15 every 28 days), and Dexamethasone 10 mg (weekly) on 08/24/2017 due to biochemical relapse.  She has tolerated it reasonably well although she felt somewhat tired.  She felt severely tired after cycle 2.  Ninlaro was held during third cycle due to excessive tiredness.  Cycle 4 of pomalidomide and dexamethasone started on 11/26/2017.  She is tolerating it very well without any side effects.  Her M spike on 11/23/2017 has come down to 0.2 from 0.4 on 10/29/2017.  Light chain ratio is stable at 2.4.  Kappa light chains have come down from 83-46. - She was seen by Dr. Rodriguez on 12/10/2017 and was recommended to cut back on dexamethasone 10 mg to every other week.  She also underwent cardiology evaluation for work-up of shortness of breath which were negative. - Today I discussed the results of the M spike dated 02/04/2018 which shows stable M spike of 0.3 g/dL.  Free light chain ratio was 2.96.  This was up from 2.23 prior to that. - She feels severely short of breath for 3 to 4 days both at rest and on exertion after taking Ninlaro.  She missed her day 15 dose of Ninlaro due to this reason.  I have told her to hold off  taking it.  She will continue Pomalyst 2 mg 3 weeks on 1 week off.  I will increase her dexamethasone to 10 mg weekly.  We will reevaluate her M spike in a month.  2.  Bone protection: She is receiving zoledronic acid once every 3 months.  Last zoledronic acid was on 01/21/2018.  3.  Peripheral neuropathy: She has numbness in the feet which has been stable.  This is from prior Velcade exposure.  4.  Normocytic anemia: This is from underlying chronic kidney disease and iron deficiency.  She received 2 weekly Feraheme infusions on 11/23/2017 and 12/05/2017.  Her latest hemoglobin was 11.1.  She also receives Aranesp as needed.         Orders placed this encounter:  Orders Placed This Encounter  Procedures  . Protein electrophoresis, serum  . Lactate dehydrogenase, isoenzymes  . Immunofixation electrophoresis  . Kappa/lambda light chains  . CBC with Differential/Platelet  . Comprehensive metabolic panel  . Ferritin  . Iron and TIBC      Sreedhar Katragadda, MD [facility]   [phone]  

## 2018-02-11 NOTE — Assessment & Plan Note (Signed)
1. 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 gain, -13 -Status post Velcade and dexamethasone from 03/18/2015 through 07/28/2015, Velcade discontinued secondary to neuropathy - Revlimid and dexamethasone from February 2017 through November 2017, achieving negative SPEP and immunofixation -Seen by Dr. Norma Fredrickson for second opinion, bone marrow biopsy on 11/14/2016 shows less than 2% plasma cells, maintenance Velcade started in May 2018, held in January 2019 due to diarrhea -Noted to have biochemical relapse with M spike of 0.5, recommended to start on reduced dose pomalidomide, Ninlaro and dexamethasone -Started first cycle of Pomalyst 2 mg (days 1-21 every 28 days), ixazomib 2.3 mg (days 1, 8, 15 every 28 days), and Dexamethasone 10 mg (weekly) on 08/24/2017 due to biochemical relapse.  She has tolerated it reasonably well although she felt somewhat tired.  She felt severely tired after cycle 2.  Ninlaro was held during third cycle due to excessive tiredness.  Cycle 4 of pomalidomide and dexamethasone started on 11/26/2017.  She is tolerating it very well without any side effects.  Her M spike on 11/23/2017 has come down to 0.2 from 0.4 on 10/29/2017.  Light chain ratio is stable at 2.4.  Kappa light chains have come down from 83-46. - She was seen by Dr. Norma Fredrickson on 12/10/2017 and was recommended to cut back on dexamethasone 10 mg to every other week.  She also underwent cardiology evaluation for work-up of shortness of breath which were negative. - Today I discussed the results of the M spike dated 02/04/2018 which shows stable M spike of 0.3 g/dL.  Free light chain ratio was 2.96.  This was up from 2.23 prior to that. - She feels severely short of breath for 3 to 4 days both at rest and on exertion after taking Ninlaro.  She missed her day 15  dose of Ninlaro due to this reason.  I have told her to hold off taking it.  She will continue Pomalyst 2 mg 3 weeks on 1 week off.  I will increase her dexamethasone to 10 mg weekly.  We will reevaluate her M spike in a month.  2.  Bone protection: She is receiving zoledronic acid once every 3 months.  Last zoledronic acid was on 01/21/2018.  3.  Peripheral neuropathy: She has numbness in the feet which has been stable.  This is from prior Velcade exposure.  4.  Normocytic anemia: This is from underlying chronic kidney disease and iron deficiency.  She received 2 weekly Feraheme infusions on 11/23/2017 and 12/05/2017.  Her latest hemoglobin was 11.1.  She also receives Aranesp as needed.

## 2018-02-11 NOTE — Patient Instructions (Signed)
[place] at [facility] Discharge Instructions  Follow up in 5 weeks. We will draw labs in 4 weeks.    Thank you for choosing [facility] to provide your oncology and hematology care.  To afford each patient quality time with our provider, please arrive at least 15 minutes before your scheduled appointment time.   If you have a lab appointment with the [facility] please come in thru the  Main Entrance and check in at the main information desk  You need to re-schedule your appointment should you arrive 10 or more minutes late.  We strive to give you quality time with our providers, and arriving late affects you and other patients whose appointments are after yours.  Also, if you no show three or more times for appointments you may be dismissed from the clinic at the providers discretion.     Again, thank you for choosing [facility].  Our hope is that these requests will decrease the amount of time that you wait before being seen by our physicians.       _____________________________________________________________  Should you have questions after your visit to [facility], please contact our office at [phone] between the hours of 8:00 a.m. and 4:30 p.m.  Voicemails left after 4:00 p.m. will not be returned until the following business day.  For prescription refill requests, have your pharmacy contact our office and allow 72 hours.    Cancer Center Support Programs:   > Cancer Support Group  2nd Tuesday of the month 1pm-2pm, Journey Room

## 2018-02-14 ENCOUNTER — Other Ambulatory Visit: Payer: Self-pay | Admitting: Cardiology

## 2018-02-14 NOTE — Telephone Encounter (Signed)
Rx sent to pharmacy   

## 2018-02-18 ENCOUNTER — Other Ambulatory Visit: Payer: Self-pay

## 2018-02-18 ENCOUNTER — Inpatient Hospital Stay (HOSPITAL_COMMUNITY): Payer: Medicare Other

## 2018-02-18 ENCOUNTER — Encounter (HOSPITAL_COMMUNITY): Payer: Self-pay

## 2018-02-18 ENCOUNTER — Inpatient Hospital Stay (HOSPITAL_COMMUNITY): Payer: Medicare Other | Attending: Hematology

## 2018-02-18 VITALS — BP 110/49 | HR 71 | Temp 97.9°F | Resp 16

## 2018-02-18 DIAGNOSIS — N183 Chronic kidney disease, stage 3 unspecified: Secondary | ICD-10-CM

## 2018-02-18 DIAGNOSIS — D631 Anemia in chronic kidney disease: Secondary | ICD-10-CM | POA: Insufficient documentation

## 2018-02-18 DIAGNOSIS — D509 Iron deficiency anemia, unspecified: Secondary | ICD-10-CM

## 2018-02-18 DIAGNOSIS — E538 Deficiency of other specified B group vitamins: Secondary | ICD-10-CM | POA: Diagnosis not present

## 2018-02-18 DIAGNOSIS — C9000 Multiple myeloma not having achieved remission: Secondary | ICD-10-CM | POA: Diagnosis not present

## 2018-02-18 DIAGNOSIS — D801 Nonfamilial hypogammaglobulinemia: Secondary | ICD-10-CM

## 2018-02-18 DIAGNOSIS — Z9221 Personal history of antineoplastic chemotherapy: Secondary | ICD-10-CM | POA: Diagnosis not present

## 2018-02-18 LAB — CBC WITH DIFFERENTIAL/PLATELET
Basophils Absolute: 0.1 10*3/uL (ref 0.0–0.1)
Basophils Relative: 3 %
EOS ABS: 0.1 10*3/uL (ref 0.0–0.7)
EOS PCT: 3 %
HCT: 32 % — ABNORMAL LOW (ref 36.0–46.0)
HEMOGLOBIN: 10.4 g/dL — AB (ref 12.0–15.0)
Lymphocytes Relative: 21 %
Lymphs Abs: 1.1 10*3/uL (ref 0.7–4.0)
MCH: 32.7 pg (ref 26.0–34.0)
MCHC: 32.5 g/dL (ref 30.0–36.0)
MCV: 100.6 fL — ABNORMAL HIGH (ref 78.0–100.0)
MONOS PCT: 27 %
Monocytes Absolute: 1.4 10*3/uL — ABNORMAL HIGH (ref 0.1–1.0)
Neutro Abs: 2.5 10*3/uL (ref 1.7–7.7)
Neutrophils Relative %: 46 %
Platelets: 258 10*3/uL (ref 150–400)
RBC: 3.18 MIL/uL — AB (ref 3.87–5.11)
RDW: 16.5 % — ABNORMAL HIGH (ref 11.5–15.5)
WBC: 5.3 10*3/uL (ref 4.0–10.5)

## 2018-02-18 LAB — COMPREHENSIVE METABOLIC PANEL
ALK PHOS: 70 U/L (ref 38–126)
ALT: 14 U/L (ref 0–44)
AST: 15 U/L (ref 15–41)
Albumin: 3 g/dL — ABNORMAL LOW (ref 3.5–5.0)
Anion gap: 5 (ref 5–15)
BUN: 19 mg/dL (ref 8–23)
CALCIUM: 7.9 mg/dL — AB (ref 8.9–10.3)
CO2: 26 mmol/L (ref 22–32)
CREATININE: 1.07 mg/dL — AB (ref 0.44–1.00)
Chloride: 113 mmol/L — ABNORMAL HIGH (ref 98–111)
GFR calc non Af Amer: 49 mL/min — ABNORMAL LOW (ref >60)
GFR, EST AFRICAN AMERICAN: 57 mL/min — AB (ref >60)
Glucose, Bld: 92 mg/dL (ref 70–99)
Potassium: 3.8 mmol/L (ref 3.5–5.1)
Sodium: 144 mmol/L (ref 135–145)
Total Bilirubin: 0.4 mg/dL (ref 0.3–1.2)
Total Protein: 6.2 g/dL — ABNORMAL LOW (ref 6.5–8.1)

## 2018-02-18 MED ORDER — DARBEPOETIN ALFA 300 MCG/0.6ML IJ SOSY
300.0000 ug | PREFILLED_SYRINGE | Freq: Once | INTRAMUSCULAR | Status: AC
  Administered 2018-02-18: 300 ug via SUBCUTANEOUS

## 2018-02-18 MED ORDER — DARBEPOETIN ALFA 300 MCG/0.6ML IJ SOSY
PREFILLED_SYRINGE | INTRAMUSCULAR | Status: AC
  Filled 2018-02-18: qty 0.6

## 2018-02-18 NOTE — Progress Notes (Signed)
Pt here today for Aranesp injection. Pt's Hgb today is 10.4. Aranesp given in left lower abdomen. Pt tolerated injection well. Pt to return sometime this week for B12 injection. Pt stable and discharged home ambulatory. Pt to continue monthly Aranesp and B12 injections.

## 2018-02-19 ENCOUNTER — Inpatient Hospital Stay (HOSPITAL_COMMUNITY): Payer: Medicare Other

## 2018-02-19 VITALS — BP 111/48 | HR 81 | Temp 98.0°F | Resp 18

## 2018-02-19 DIAGNOSIS — C9000 Multiple myeloma not having achieved remission: Secondary | ICD-10-CM | POA: Diagnosis not present

## 2018-02-19 DIAGNOSIS — E538 Deficiency of other specified B group vitamins: Secondary | ICD-10-CM

## 2018-02-19 MED ORDER — CYANOCOBALAMIN 1000 MCG/ML IJ SOLN
1000.0000 ug | Freq: Once | INTRAMUSCULAR | Status: AC
  Administered 2018-02-19: 1000 ug via INTRAMUSCULAR

## 2018-02-19 MED ORDER — CYANOCOBALAMIN 1000 MCG/ML IJ SOLN
INTRAMUSCULAR | Status: AC
  Filled 2018-02-19: qty 1

## 2018-02-19 NOTE — Progress Notes (Signed)
Carol Bradley presents today for injection per MD orders. B12 1,000 mcg administered SQ in left Upper Arm. Administration without incident. Patient tolerated well. . Vitals stable and discharged home from clinic ambulatory. Follow up as scheduled.

## 2018-02-27 ENCOUNTER — Other Ambulatory Visit (HOSPITAL_COMMUNITY): Payer: Self-pay | Admitting: Oncology

## 2018-02-27 DIAGNOSIS — C9001 Multiple myeloma in remission: Secondary | ICD-10-CM

## 2018-03-04 ENCOUNTER — Inpatient Hospital Stay (HOSPITAL_COMMUNITY): Payer: Medicare Other

## 2018-03-04 ENCOUNTER — Encounter (HOSPITAL_COMMUNITY): Payer: Self-pay

## 2018-03-04 ENCOUNTER — Other Ambulatory Visit: Payer: Self-pay

## 2018-03-04 VITALS — BP 132/58 | HR 73 | Temp 97.6°F | Resp 18

## 2018-03-04 DIAGNOSIS — N183 Chronic kidney disease, stage 3 unspecified: Secondary | ICD-10-CM

## 2018-03-04 DIAGNOSIS — E538 Deficiency of other specified B group vitamins: Secondary | ICD-10-CM

## 2018-03-04 DIAGNOSIS — D631 Anemia in chronic kidney disease: Secondary | ICD-10-CM

## 2018-03-04 DIAGNOSIS — D509 Iron deficiency anemia, unspecified: Secondary | ICD-10-CM

## 2018-03-04 DIAGNOSIS — C9000 Multiple myeloma not having achieved remission: Secondary | ICD-10-CM | POA: Diagnosis not present

## 2018-03-04 LAB — COMPREHENSIVE METABOLIC PANEL
ALK PHOS: 69 U/L (ref 38–126)
ALT: 17 U/L (ref 0–44)
AST: 15 U/L (ref 15–41)
Albumin: 3.1 g/dL — ABNORMAL LOW (ref 3.5–5.0)
Anion gap: 8 (ref 5–15)
BILIRUBIN TOTAL: 0.6 mg/dL (ref 0.3–1.2)
BUN: 21 mg/dL (ref 8–23)
CALCIUM: 8.3 mg/dL — AB (ref 8.9–10.3)
CO2: 28 mmol/L (ref 22–32)
CREATININE: 1.15 mg/dL — AB (ref 0.44–1.00)
Chloride: 106 mmol/L (ref 98–111)
GFR, EST AFRICAN AMERICAN: 52 mL/min — AB (ref >60)
GFR, EST NON AFRICAN AMERICAN: 45 mL/min — AB (ref >60)
Glucose, Bld: 122 mg/dL — ABNORMAL HIGH (ref 70–99)
Potassium: 3.5 mmol/L (ref 3.5–5.1)
Sodium: 142 mmol/L (ref 135–145)
Total Protein: 6 g/dL — ABNORMAL LOW (ref 6.5–8.1)

## 2018-03-04 LAB — CBC WITH DIFFERENTIAL/PLATELET
Basophils Absolute: 0 10*3/uL (ref 0.0–0.1)
Basophils Relative: 0 %
EOS ABS: 0.3 10*3/uL (ref 0.0–0.7)
Eosinophils Relative: 2 %
HEMATOCRIT: 34.2 % — AB (ref 36.0–46.0)
HEMOGLOBIN: 10.6 g/dL — AB (ref 12.0–15.0)
LYMPHS PCT: 20 %
Lymphs Abs: 2.3 10*3/uL (ref 0.7–4.0)
MCH: 31.4 pg (ref 26.0–34.0)
MCHC: 31 g/dL (ref 30.0–36.0)
MCV: 101.2 fL — AB (ref 78.0–100.0)
MONOS PCT: 19 %
Monocytes Absolute: 2.2 10*3/uL (ref 0.1–1.0)
NEUTROS ABS: 6.7 10*3/uL (ref 1.7–7.7)
NEUTROS PCT: 59 %
Platelets: 297 10*3/uL (ref 150–400)
RBC: 3.38 MIL/uL — AB (ref 3.87–5.11)
RDW: 16.9 % — ABNORMAL HIGH (ref 11.5–15.5)
WBC: 11.6 10*3/uL — ABNORMAL HIGH (ref 4.0–10.5)

## 2018-03-04 MED ORDER — DARBEPOETIN ALFA 300 MCG/0.6ML IJ SOSY
300.0000 ug | PREFILLED_SYRINGE | Freq: Once | INTRAMUSCULAR | Status: AC
  Administered 2018-03-04: 300 ug via SUBCUTANEOUS
  Filled 2018-03-04: qty 0.6

## 2018-03-04 NOTE — Progress Notes (Signed)
Carol Bradley presents today for injection per MD orders. Aranesp 300 mcg administered SQ in left Abdomen. Administration without incident. Patient tolerated well.   Vitals stable and discharged home from clinic ambulatory. Follow up as scheduled.

## 2018-03-04 NOTE — Patient Instructions (Signed)
[facility] at [facility] Discharge Instructions  Aranesp given today per orders.  Follow up as scheduled.   Thank you for choosing [facility] at [facility] to provide your oncology and hematology care.  To afford each patient quality time with our provider, please arrive at least 15 minutes before your scheduled appointment time.   If you have a lab appointment with the Cancer Center please come in thru the  Main Entrance and check in at the main information desk  You need to re-schedule your appointment should you arrive 10 or more minutes late.  We strive to give you quality time with our providers, and arriving late affects you and other patients whose appointments are after yours.  Also, if you no show three or more times for appointments you may be dismissed from the clinic at the providers discretion.     Again, thank you for choosing [facility].  Our hope is that these requests will decrease the amount of time that you wait before being seen by our physicians.       _____________________________________________________________  Should you have questions after your visit to [facility], please contact our office at [phone] between the hours of 8:00 a.m. and 4:30 p.m.  Voicemails left after 4:00 p.m. will not be returned until the following business day.  For prescription refill requests, have your pharmacy contact our office and allow 72 hours.    Cancer Center Support Programs:   > Cancer Support Group  2nd Tuesday of the month 1pm-2pm, Journey Room   

## 2018-03-06 ENCOUNTER — Other Ambulatory Visit (HOSPITAL_COMMUNITY): Payer: Self-pay | Admitting: *Deleted

## 2018-03-06 DIAGNOSIS — C9001 Multiple myeloma in remission: Secondary | ICD-10-CM

## 2018-03-06 MED ORDER — ACYCLOVIR 200 MG PO CAPS: 200.0000 mg | ORAL_CAPSULE | Freq: Two times a day (BID) | ORAL | 1 refills | Status: DC

## 2018-03-06 MED ORDER — DEXAMETHASONE 2 MG PO TABS: ORAL_TABLET | ORAL | 3 refills | Status: DC

## 2018-03-06 NOTE — Telephone Encounter (Signed)
Chart reviewed, acyclovir refilled. Dexamethasone 10 mg weekly, prescription written to reflect most recent note.

## 2018-03-08 ENCOUNTER — Inpatient Hospital Stay (HOSPITAL_COMMUNITY): Payer: Medicare Other

## 2018-03-08 DIAGNOSIS — C9000 Multiple myeloma not having achieved remission: Secondary | ICD-10-CM

## 2018-03-08 DIAGNOSIS — D509 Iron deficiency anemia, unspecified: Secondary | ICD-10-CM

## 2018-03-08 LAB — COMPREHENSIVE METABOLIC PANEL
ALBUMIN: 3.1 g/dL — AB (ref 3.5–5.0)
ALK PHOS: 71 U/L (ref 38–126)
ALT: 26 U/L (ref 0–44)
AST: 19 U/L (ref 15–41)
Anion gap: 6 (ref 5–15)
BILIRUBIN TOTAL: 0.6 mg/dL (ref 0.3–1.2)
BUN: 29 mg/dL — AB (ref 8–23)
CALCIUM: 8.8 mg/dL — AB (ref 8.9–10.3)
CO2: 27 mmol/L (ref 22–32)
CREATININE: 1.14 mg/dL — AB (ref 0.44–1.00)
Chloride: 107 mmol/L (ref 98–111)
GFR calc Af Amer: 52 mL/min — ABNORMAL LOW (ref >60)
GFR calc non Af Amer: 45 mL/min — ABNORMAL LOW (ref >60)
GLUCOSE: 121 mg/dL — AB (ref 70–99)
Potassium: 4.2 mmol/L (ref 3.5–5.1)
SODIUM: 140 mmol/L (ref 135–145)
Total Protein: 6.4 g/dL — ABNORMAL LOW (ref 6.5–8.1)

## 2018-03-08 LAB — CBC WITH DIFFERENTIAL/PLATELET
BASOS PCT: 0 %
Basophils Absolute: 0 10*3/uL (ref 0.0–0.1)
EOS ABS: 0 10*3/uL (ref 0.0–0.7)
Eosinophils Relative: 0 %
HEMATOCRIT: 32.9 % — AB (ref 36.0–46.0)
HEMOGLOBIN: 10.3 g/dL — AB (ref 12.0–15.0)
Lymphocytes Relative: 7 %
Lymphs Abs: 0.6 10*3/uL — ABNORMAL LOW (ref 0.7–4.0)
MCH: 30.7 pg (ref 26.0–34.0)
MCHC: 31.3 g/dL (ref 30.0–36.0)
MCV: 98.2 fL (ref 78.0–100.0)
Monocytes Absolute: 1.8 10*3/uL — ABNORMAL HIGH (ref 0.1–1.0)
Monocytes Relative: 20 %
NEUTROS ABS: 6.5 10*3/uL (ref 1.7–7.7)
NEUTROS PCT: 73 %
Platelets: 233 10*3/uL (ref 150–400)
RBC: 3.35 MIL/uL — AB (ref 3.87–5.11)
RDW: 16.3 % — ABNORMAL HIGH (ref 11.5–15.5)
WBC: 9 10*3/uL (ref 4.0–10.5)

## 2018-03-08 LAB — IRON AND TIBC
Iron: 33 ug/dL (ref 28–170)
Saturation Ratios: 12 % (ref 10.4–31.8)
TIBC: 284 ug/dL (ref 250–450)
UIBC: 251 ug/dL

## 2018-03-08 LAB — FERRITIN: Ferritin: 55 ng/mL (ref 11–307)

## 2018-03-11 LAB — IMMUNOFIXATION ELECTROPHORESIS
IGG (IMMUNOGLOBIN G), SERUM: 693 mg/dL — AB (ref 700–1600)
IGM (IMMUNOGLOBULIN M), SRM: 98 mg/dL (ref 26–217)
IgA: 106 mg/dL (ref 64–422)
Total Protein ELP: 5.9 g/dL — ABNORMAL LOW (ref 6.0–8.5)

## 2018-03-11 LAB — PROTEIN ELECTROPHORESIS, SERUM
A/G Ratio: 1.3 (ref 0.7–1.7)
ALPHA-2-GLOBULIN: 1 g/dL (ref 0.4–1.0)
Albumin ELP: 3.3 g/dL (ref 2.9–4.4)
Alpha-1-Globulin: 0.3 g/dL (ref 0.0–0.4)
BETA GLOBULIN: 0.7 g/dL (ref 0.7–1.3)
GLOBULIN, TOTAL: 2.6 g/dL (ref 2.2–3.9)
Gamma Globulin: 0.6 g/dL (ref 0.4–1.8)
M-Spike, %: 0.2 g/dL — ABNORMAL HIGH
Total Protein ELP: 5.9 g/dL — ABNORMAL LOW (ref 6.0–8.5)

## 2018-03-12 LAB — KAPPA/LAMBDA LIGHT CHAINS
Kappa free light chain: 40.7 mg/L — ABNORMAL HIGH (ref 3.3–19.4)
Kappa, lambda light chain ratio: 2.02 — ABNORMAL HIGH (ref 0.26–1.65)
LAMDA FREE LIGHT CHAINS: 20.1 mg/L (ref 5.7–26.3)

## 2018-03-13 ENCOUNTER — Other Ambulatory Visit (HOSPITAL_COMMUNITY): Payer: Self-pay | Admitting: *Deleted

## 2018-03-13 DIAGNOSIS — C9000 Multiple myeloma not having achieved remission: Secondary | ICD-10-CM

## 2018-03-13 MED ORDER — POMALIDOMIDE 2 MG PO CAPS: 2.0000 mg | ORAL_CAPSULE | Freq: Every day | ORAL | 0 refills | Status: DC

## 2018-03-13 NOTE — Telephone Encounter (Signed)
Chart reviewed, pomalyst refilled.  

## 2018-03-14 LAB — LACTATE DEHYDROGENASE, ISOENZYMES
LDH 1: 25 % (ref 17–32)
LDH 2: 33 % (ref 25–40)
LDH 3: 23 % (ref 17–27)
LDH 4: 11 % (ref 5–13)
LDH 5: 8 % (ref 4–20)
LDH ISOENZYMES, TOTAL: 170 IU/L (ref 119–226)

## 2018-03-15 ENCOUNTER — Ambulatory Visit (HOSPITAL_COMMUNITY): Payer: Medicare Other | Admitting: Hematology

## 2018-03-18 ENCOUNTER — Inpatient Hospital Stay (HOSPITAL_COMMUNITY): Payer: Medicare Other

## 2018-03-18 ENCOUNTER — Encounter (HOSPITAL_COMMUNITY): Payer: Self-pay

## 2018-03-18 ENCOUNTER — Inpatient Hospital Stay (HOSPITAL_COMMUNITY): Payer: Medicare Other | Attending: Hematology

## 2018-03-18 VITALS — BP 113/47 | HR 81 | Temp 97.7°F | Resp 18

## 2018-03-18 DIAGNOSIS — D801 Nonfamilial hypogammaglobulinemia: Secondary | ICD-10-CM

## 2018-03-18 DIAGNOSIS — D631 Anemia in chronic kidney disease: Secondary | ICD-10-CM | POA: Diagnosis present

## 2018-03-18 DIAGNOSIS — D509 Iron deficiency anemia, unspecified: Secondary | ICD-10-CM

## 2018-03-18 DIAGNOSIS — E538 Deficiency of other specified B group vitamins: Secondary | ICD-10-CM

## 2018-03-18 DIAGNOSIS — N183 Chronic kidney disease, stage 3 (moderate): Secondary | ICD-10-CM

## 2018-03-18 DIAGNOSIS — N189 Chronic kidney disease, unspecified: Secondary | ICD-10-CM | POA: Insufficient documentation

## 2018-03-18 DIAGNOSIS — C9000 Multiple myeloma not having achieved remission: Secondary | ICD-10-CM

## 2018-03-18 LAB — CBC WITH DIFFERENTIAL/PLATELET
BASOS PCT: 3 %
Basophils Absolute: 0.1 10*3/uL (ref 0.0–0.1)
Eosinophils Absolute: 0.2 10*3/uL (ref 0.0–0.7)
Eosinophils Relative: 3 %
HEMATOCRIT: 32.2 % — AB (ref 36.0–46.0)
HEMOGLOBIN: 10 g/dL — AB (ref 12.0–15.0)
LYMPHS PCT: 19 %
Lymphs Abs: 1.1 10*3/uL (ref 0.7–4.0)
MCH: 30.6 pg (ref 26.0–34.0)
MCHC: 31.1 g/dL (ref 30.0–36.0)
MCV: 98.5 fL (ref 78.0–100.0)
Monocytes Absolute: 1.3 10*3/uL — ABNORMAL HIGH (ref 0.1–1.0)
Monocytes Relative: 24 %
NEUTROS ABS: 2.8 10*3/uL (ref 1.7–7.7)
Neutrophils Relative %: 51 %
Platelets: 339 10*3/uL (ref 150–400)
RBC: 3.27 MIL/uL — ABNORMAL LOW (ref 3.87–5.11)
RDW: 15.8 % — ABNORMAL HIGH (ref 11.5–15.5)
WBC: 5.5 10*3/uL (ref 4.0–10.5)

## 2018-03-18 MED ORDER — DARBEPOETIN ALFA 300 MCG/0.6ML IJ SOSY
300.0000 ug | PREFILLED_SYRINGE | Freq: Once | INTRAMUSCULAR | Status: AC
  Administered 2018-03-18: 300 ug via SUBCUTANEOUS
  Filled 2018-03-18: qty 0.6

## 2018-03-18 NOTE — Progress Notes (Signed)
Carol Bradley tolerated Aranesp injection well without complaints or incident. Hgb 10 today. VSS PT discharged self ambulatory using a cane in satisfactory condition

## 2018-03-18 NOTE — Patient Instructions (Signed)
[facility] at [facility] Discharge Instructions  Received Aranesp injection today. Follow-up as scheduled. Call clinic for any questions or concerns   Thank you for choosing  [facility] at [facility] to provide your oncology and hematology care.  To afford each patient quality time with our provider, please arrive at least 15 minutes before your scheduled appointment time.   If you have a lab appointment with the Cancer Center please come in thru the  Main Entrance and check in at the main information desk  You need to re-schedule your appointment should you arrive 10 or more minutes late.  We strive to give you quality time with our providers, and arriving late affects you and other patients whose appointments are after yours.  Also, if you no show three or more times for appointments you may be dismissed from the clinic at the providers discretion.     Again, thank you for choosing [facility].  Our hope is that these requests will decrease the amount of time that you wait before being seen by our physicians.       _____________________________________________________________  Should you have questions after your visit to [facility], please contact our office at [phone] between the hours of 8:00 a.m. and 4:30 p.m.  Voicemails left after 4:00 p.m. will not be returned until the following business day.  For prescription refill requests, have your pharmacy contact our office and allow 72 hours.    Cancer Center Support Programs:   > Cancer Support Group  2nd Tuesday of the month 1pm-2pm, Journey Room   

## 2018-03-21 ENCOUNTER — Other Ambulatory Visit (HOSPITAL_COMMUNITY): Payer: Self-pay | Admitting: Internal Medicine

## 2018-03-22 ENCOUNTER — Encounter (HOSPITAL_COMMUNITY): Payer: Self-pay

## 2018-03-22 ENCOUNTER — Other Ambulatory Visit: Payer: Self-pay

## 2018-03-22 ENCOUNTER — Inpatient Hospital Stay (HOSPITAL_COMMUNITY): Payer: Medicare Other

## 2018-03-22 VITALS — BP 137/53 | HR 73 | Temp 97.6°F | Resp 18

## 2018-03-22 DIAGNOSIS — N189 Chronic kidney disease, unspecified: Secondary | ICD-10-CM | POA: Diagnosis not present

## 2018-03-22 DIAGNOSIS — E538 Deficiency of other specified B group vitamins: Secondary | ICD-10-CM

## 2018-03-22 MED ORDER — CYANOCOBALAMIN 1000 MCG/ML IJ SOLN
1000.0000 ug | Freq: Once | INTRAMUSCULAR | Status: AC
  Administered 2018-03-22: 1000 ug via INTRAMUSCULAR

## 2018-03-22 NOTE — Progress Notes (Signed)
Carol Bradley presents today for injection per the provider's orders.  B12 administration without incident; see MAR for injection details.  Patient tolerated procedure well and without incident.  No questions or complaints noted at this time.  Discharged ambulatory.  

## 2018-04-01 ENCOUNTER — Ambulatory Visit (HOSPITAL_COMMUNITY): Payer: Medicare Other

## 2018-04-01 ENCOUNTER — Other Ambulatory Visit (HOSPITAL_COMMUNITY): Payer: Medicare Other

## 2018-04-02 ENCOUNTER — Encounter (HOSPITAL_COMMUNITY): Payer: Self-pay

## 2018-04-02 ENCOUNTER — Inpatient Hospital Stay (HOSPITAL_COMMUNITY): Payer: Medicare Other

## 2018-04-02 ENCOUNTER — Other Ambulatory Visit: Payer: Self-pay

## 2018-04-02 VITALS — BP 117/55 | HR 71 | Temp 97.9°F | Resp 18

## 2018-04-02 DIAGNOSIS — N183 Chronic kidney disease, stage 3 unspecified: Secondary | ICD-10-CM

## 2018-04-02 DIAGNOSIS — E538 Deficiency of other specified B group vitamins: Secondary | ICD-10-CM

## 2018-04-02 DIAGNOSIS — N189 Chronic kidney disease, unspecified: Secondary | ICD-10-CM | POA: Diagnosis not present

## 2018-04-02 DIAGNOSIS — D631 Anemia in chronic kidney disease: Secondary | ICD-10-CM

## 2018-04-02 DIAGNOSIS — D509 Iron deficiency anemia, unspecified: Secondary | ICD-10-CM

## 2018-04-02 LAB — CBC WITH DIFFERENTIAL/PLATELET
BASOS ABS: 0.1 10*3/uL (ref 0.0–0.1)
Basophils Relative: 1 %
Eosinophils Absolute: 0.4 10*3/uL (ref 0.0–0.7)
Eosinophils Relative: 4 %
HCT: 32.4 % — ABNORMAL LOW (ref 36.0–46.0)
HEMOGLOBIN: 9.7 g/dL — AB (ref 12.0–15.0)
LYMPHS PCT: 12 %
Lymphs Abs: 1.2 10*3/uL (ref 0.7–4.0)
MCH: 29.4 pg (ref 26.0–34.0)
MCHC: 29.9 g/dL — ABNORMAL LOW (ref 30.0–36.0)
MCV: 98.2 fL (ref 78.0–100.0)
MONOS PCT: 26 %
Monocytes Absolute: 2.6 10*3/uL — ABNORMAL HIGH (ref 0.1–1.0)
NEUTROS ABS: 5.7 10*3/uL (ref 1.7–7.7)
Neutrophils Relative %: 57 %
Platelets: 297 10*3/uL (ref 150–400)
RBC: 3.3 MIL/uL — AB (ref 3.87–5.11)
RDW: 16.5 % — ABNORMAL HIGH (ref 11.5–15.5)
WBC: 10 10*3/uL (ref 4.0–10.5)

## 2018-04-02 LAB — FERRITIN: FERRITIN: 24 ng/mL (ref 11–307)

## 2018-04-02 LAB — IRON AND TIBC
IRON: 27 ug/dL — AB (ref 28–170)
Saturation Ratios: 9 % — ABNORMAL LOW (ref 10.4–31.8)
TIBC: 292 ug/dL (ref 250–450)
UIBC: 265 ug/dL

## 2018-04-02 MED ORDER — DARBEPOETIN ALFA 300 MCG/0.6ML IJ SOSY
300.0000 ug | PREFILLED_SYRINGE | Freq: Once | INTRAMUSCULAR | Status: AC
  Administered 2018-04-02: 300 ug via SUBCUTANEOUS

## 2018-04-02 MED ORDER — DARBEPOETIN ALFA 300 MCG/0.6ML IJ SOSY
PREFILLED_SYRINGE | INTRAMUSCULAR | Status: AC
  Filled 2018-04-02: qty 0.6

## 2018-04-02 NOTE — Addendum Note (Signed)
Addended by: Joie Bimler on: 04/02/2018 11:13 AM   Modules accepted: Orders

## 2018-04-02 NOTE — Progress Notes (Signed)
Carol Bradley presents today for injection per the provider's orders.  Aranesp administration without incident; see MAR for injection details.  Patient tolerated procedure well and without incident.  No questions or complaints noted at this time.  Discharged ambulatory.  

## 2018-04-03 ENCOUNTER — Other Ambulatory Visit (HOSPITAL_COMMUNITY): Payer: Self-pay | Admitting: *Deleted

## 2018-04-03 DIAGNOSIS — C9000 Multiple myeloma not having achieved remission: Secondary | ICD-10-CM

## 2018-04-03 MED ORDER — POMALIDOMIDE 2 MG PO CAPS: 2.0000 mg | ORAL_CAPSULE | Freq: Every day | ORAL | 0 refills | Status: DC

## 2018-04-03 NOTE — Telephone Encounter (Signed)
Chart reviewed, pomalyst refilled.  

## 2018-04-15 ENCOUNTER — Encounter (HOSPITAL_COMMUNITY): Payer: Self-pay | Admitting: Hematology

## 2018-04-15 ENCOUNTER — Inpatient Hospital Stay (HOSPITAL_COMMUNITY): Payer: Medicare Other

## 2018-04-15 ENCOUNTER — Inpatient Hospital Stay (HOSPITAL_BASED_OUTPATIENT_CLINIC_OR_DEPARTMENT_OTHER): Payer: Medicare Other | Admitting: Hematology

## 2018-04-15 ENCOUNTER — Inpatient Hospital Stay (HOSPITAL_COMMUNITY): Payer: Medicare Other | Attending: Hematology

## 2018-04-15 ENCOUNTER — Other Ambulatory Visit (HOSPITAL_COMMUNITY): Payer: Medicare Other

## 2018-04-15 VITALS — BP 138/57 | HR 64 | Temp 97.8°F | Resp 18 | Wt 160.0 lb

## 2018-04-15 VITALS — BP 111/49 | HR 63 | Temp 97.6°F

## 2018-04-15 DIAGNOSIS — D649 Anemia, unspecified: Secondary | ICD-10-CM | POA: Insufficient documentation

## 2018-04-15 DIAGNOSIS — D631 Anemia in chronic kidney disease: Secondary | ICD-10-CM

## 2018-04-15 DIAGNOSIS — I2510 Atherosclerotic heart disease of native coronary artery without angina pectoris: Secondary | ICD-10-CM | POA: Insufficient documentation

## 2018-04-15 DIAGNOSIS — R509 Fever, unspecified: Secondary | ICD-10-CM | POA: Insufficient documentation

## 2018-04-15 DIAGNOSIS — T451X5S Adverse effect of antineoplastic and immunosuppressive drugs, sequela: Secondary | ICD-10-CM | POA: Insufficient documentation

## 2018-04-15 DIAGNOSIS — Z87891 Personal history of nicotine dependence: Secondary | ICD-10-CM

## 2018-04-15 DIAGNOSIS — R5383 Other fatigue: Secondary | ICD-10-CM | POA: Diagnosis not present

## 2018-04-15 DIAGNOSIS — Z79899 Other long term (current) drug therapy: Secondary | ICD-10-CM | POA: Insufficient documentation

## 2018-04-15 DIAGNOSIS — N183 Chronic kidney disease, stage 3 unspecified: Secondary | ICD-10-CM

## 2018-04-15 DIAGNOSIS — I129 Hypertensive chronic kidney disease with stage 1 through stage 4 chronic kidney disease, or unspecified chronic kidney disease: Secondary | ICD-10-CM | POA: Diagnosis not present

## 2018-04-15 DIAGNOSIS — Z7982 Long term (current) use of aspirin: Secondary | ICD-10-CM | POA: Insufficient documentation

## 2018-04-15 DIAGNOSIS — Z23 Encounter for immunization: Secondary | ICD-10-CM | POA: Insufficient documentation

## 2018-04-15 DIAGNOSIS — R531 Weakness: Secondary | ICD-10-CM | POA: Diagnosis not present

## 2018-04-15 DIAGNOSIS — G620 Drug-induced polyneuropathy: Secondary | ICD-10-CM | POA: Insufficient documentation

## 2018-04-15 DIAGNOSIS — J449 Chronic obstructive pulmonary disease, unspecified: Secondary | ICD-10-CM | POA: Diagnosis not present

## 2018-04-15 DIAGNOSIS — C9000 Multiple myeloma not having achieved remission: Secondary | ICD-10-CM

## 2018-04-15 DIAGNOSIS — D509 Iron deficiency anemia, unspecified: Secondary | ICD-10-CM | POA: Diagnosis not present

## 2018-04-15 DIAGNOSIS — E785 Hyperlipidemia, unspecified: Secondary | ICD-10-CM | POA: Insufficient documentation

## 2018-04-15 DIAGNOSIS — E538 Deficiency of other specified B group vitamins: Secondary | ICD-10-CM

## 2018-04-15 DIAGNOSIS — D801 Nonfamilial hypogammaglobulinemia: Secondary | ICD-10-CM

## 2018-04-15 LAB — CBC WITH DIFFERENTIAL/PLATELET
Basophils Absolute: 0.1 10*3/uL (ref 0.0–0.1)
Basophils Relative: 1 %
EOS ABS: 0.1 10*3/uL (ref 0.0–0.7)
Eosinophils Relative: 1 %
HCT: 33.1 % — ABNORMAL LOW (ref 36.0–46.0)
HEMOGLOBIN: 9.8 g/dL — AB (ref 12.0–15.0)
LYMPHS PCT: 16 %
Lymphs Abs: 1.2 10*3/uL (ref 0.7–4.0)
MCH: 29.1 pg (ref 26.0–34.0)
MCHC: 29.6 g/dL — AB (ref 30.0–36.0)
MCV: 98.2 fL (ref 78.0–100.0)
MONOS PCT: 23 %
Monocytes Absolute: 1.7 10*3/uL — ABNORMAL HIGH (ref 0.1–1.0)
NEUTROS PCT: 59 %
Neutro Abs: 4.4 10*3/uL (ref 1.7–7.7)
Platelets: 344 10*3/uL (ref 150–400)
RBC: 3.37 MIL/uL — ABNORMAL LOW (ref 3.87–5.11)
RDW: 16.1 % — ABNORMAL HIGH (ref 11.5–15.5)
WBC: 7.4 10*3/uL (ref 4.0–10.5)

## 2018-04-15 LAB — COMPREHENSIVE METABOLIC PANEL
ALBUMIN: 3.1 g/dL — AB (ref 3.5–5.0)
ALT: 14 U/L (ref 0–44)
AST: 15 U/L (ref 15–41)
Alkaline Phosphatase: 72 U/L (ref 38–126)
Anion gap: 6 (ref 5–15)
BUN: 23 mg/dL (ref 8–23)
CHLORIDE: 107 mmol/L (ref 98–111)
CO2: 27 mmol/L (ref 22–32)
Calcium: 8.8 mg/dL — ABNORMAL LOW (ref 8.9–10.3)
Creatinine, Ser: 1.21 mg/dL — ABNORMAL HIGH (ref 0.44–1.00)
GFR calc Af Amer: 49 mL/min — ABNORMAL LOW (ref >60)
GFR calc non Af Amer: 42 mL/min — ABNORMAL LOW (ref >60)
GLUCOSE: 100 mg/dL — AB (ref 70–99)
POTASSIUM: 3.9 mmol/L (ref 3.5–5.1)
SODIUM: 140 mmol/L (ref 135–145)
Total Bilirubin: 0.5 mg/dL (ref 0.3–1.2)
Total Protein: 6.6 g/dL (ref 6.5–8.1)

## 2018-04-15 MED ORDER — SODIUM CHLORIDE 0.9 % IV SOLN
INTRAVENOUS | Status: DC
  Administered 2018-04-15: 12:00:00 via INTRAVENOUS

## 2018-04-15 MED ORDER — ZOLEDRONIC ACID 4 MG/5ML IV CONC
3.0000 mg | Freq: Once | INTRAVENOUS | Status: AC
  Administered 2018-04-15: 3 mg via INTRAVENOUS
  Filled 2018-04-15: qty 3.75

## 2018-04-15 MED ORDER — INFLUENZA VAC SPLIT HIGH-DOSE 0.5 ML IM SUSY
0.5000 mL | PREFILLED_SYRINGE | INTRAMUSCULAR | Status: AC
  Administered 2018-04-15: 0.5 mL via INTRAMUSCULAR

## 2018-04-15 MED ORDER — CYANOCOBALAMIN 1000 MCG/ML IJ SOLN
1000.0000 ug | Freq: Once | INTRAMUSCULAR | Status: AC
  Administered 2018-04-15: 1000 ug via INTRAMUSCULAR
  Filled 2018-04-15: qty 1

## 2018-04-15 MED ORDER — SODIUM CHLORIDE 0.9 % IV SOLN
510.0000 mg | Freq: Once | INTRAVENOUS | Status: AC
  Administered 2018-04-15: 510 mg via INTRAVENOUS
  Filled 2018-04-15: qty 17

## 2018-04-15 NOTE — Assessment & Plan Note (Signed)
1. 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 gain, -13 -Status post Velcade and dexamethasone from 03/18/2015 through 07/28/2015, Velcade discontinued secondary to neuropathy - Revlimid and dexamethasone from February 2017 through November 2017, achieving negative SPEP and immunofixation -Seen by Dr. Norma Fredrickson for second opinion, bone marrow biopsy on 11/14/2016 shows less than 2% plasma cells, maintenance Velcade started in May 2018, held in January 2019 due to diarrhea -Noted to have biochemical relapse with M spike of 0.5, recommended to start on reduced dose pomalidomide, Ninlaro and dexamethasone -Started first cycle of Pomalyst 2 mg (days 1-21 every 28 days), ixazomib 2.3 mg (days 1, 8, 15 every 28 days), and Dexamethasone 10 mg (weekly) on 08/24/2017 due to biochemical relapse.  She has tolerated it reasonably well although she felt somewhat tired.  She felt severely tired after cycle 2.  Ninlaro was held during third cycle due to excessive tiredness.  Cycle 4 of pomalidomide and dexamethasone started on 11/26/2017.  She is tolerating it very well without any side effects.  Her M spike on 11/23/2017 has come down to 0.2 from 0.4 on 10/29/2017.  Light chain ratio is stable at 2.4.  Kappa light chains have come down from 83-46. - She was seen by Dr. Norma Fredrickson on 12/10/2017 and was recommended to cut back on dexamethasone 10 mg to every other week.  She also underwent cardiology evaluation for work-up of shortness of breath which were negative. - She is currently taking pomalidomide 2 mg 3 weeks on 1 week off.  Dexamethasone is being taken 10 mg every other week.  Ninlaro was on hold since last visit on 02/11/2018. - Her M spike from 03/08/2018 was 0.2.  Free light chain ratio was 2.02.  Kappa light chains were elevated at 47. -I have  recommended her to start back on Ninlaro 2.3 mg.  She will also start pomalidomide today.  She will continue dexamethasone 10 mg every other week. -She will receive flu injection today.  She will come back in 2 months for follow-up.  2.  Bone protection: She is receiving zoledronic acid 3 mg every 3 months.  Last Zometa was on 04/15/2018.  3.  Peripheral neuropathy: She has numbness in the feet which has been stable.  This is from prior Velcade exposure.  4.  Normocytic anemia: - This is from underlying CKD and iron deficiency.  Her hemoglobin today dropped to 9.8.  Her iron is also low.  I have recommended 2 weekly infusions of Feraheme.

## 2018-04-15 NOTE — Patient Instructions (Signed)
[facility] at [facility] Discharge Instructions     Thank you for choosing [facility] at [facility] to provide your oncology and hematology care.  To afford each patient quality time with our provider, please arrive at least 15 minutes before your scheduled appointment time.   If you have a lab appointment with the Cancer Center please come in thru the  Main Entrance and check in at the main information desk  You need to re-schedule your appointment should you arrive 10 or more minutes late.  We strive to give you quality time with our providers, and arriving late affects you and other patients whose appointments are after yours.  Also, if you no show three or more times for appointments you may be dismissed from the clinic at the providers discretion.     Again, thank you for choosing [facility].  Our hope is that these requests will decrease the amount of time that you wait before being seen by our physicians.       _____________________________________________________________  Should you have questions after your visit to  Cancer Center, please contact our office at [phone] between the hours of 8:00 a.m. and 4:30 p.m.  Voicemails left after 4:00 p.m. will not be returned until the following business day.  For prescription refill requests, have your pharmacy contact our office and allow 72 hours.    Cancer Center Support Programs:   > Cancer Support Group  2nd Tuesday of the month 1pm-2pm, Journey Room    

## 2018-04-15 NOTE — Progress Notes (Signed)
Tolerated infusions w/o adverse reaction.  Alert, in no distress.  VSS.  Discharged ambulatory.  

## 2018-04-15 NOTE — Progress Notes (Signed)
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     03/18/2015 - 07/28/2015 Chemotherapy    Velcade 1.6 mg/m2 discontinued secondary to intolerance    07/28/2015 Adverse Reaction    stool incontinence, weakness, collapse upon standing, felt to be secondary to velcade    11/09/2015 - 12/13/2015 Chemotherapy    cytoxan IV 300 mg/m2 administered X 2 doses only in addition to rev/dex    11/09/2015 - 06/08/2016 Chemotherapy    Revlimid/Dexamethasone     02/05/2017 - 07/09/2017 Chemotherapy    The patient had bortezomib SQ (VELCADE) chemo injection 1.75 mg, 1 mg/m2 = 1.75 mg (66.7 % of original dose 1.5 mg/m2), Subcutaneous,  Once, 1 of 8 cycles Dose modification: 1.5 mg/m2 (original dose 1.5 mg/m2, Cycle 1, Reason: Provider Judgment, Comment: Per Dr. Norma Fredrickson recommendations from [facility] 1 mg/m2 (original dose 1.5 mg/m2, Cycle 1, Reason: Provider Judgment)  for chemotherapy treatment.      07/09/2017 Adverse Reaction    Diarrhea     Chemotherapy    Pomalyst 2 mg (Days 1-21 every 28 days), Ixazomib 2.3 mg (days 1, 8, 15 every 28 days), and Dexamethasone 10 mg (weekly)- Rx's printed on 08/24/2017.  Treatment recommendations from Dr. Norma Fredrickson at [facility].      INTERVAL HISTORY:  Ms. Dufford 77 y.o. female returns for routine follow-up for multiple myeloma. Patient is tolerating her treatment well. She does report fatigue and weakness. She reports SOB is improved since her  last visit. She reports her appetite at 100% and she is maintaining her weight at this time. Her energy level is 75%. She denies any new pains. Denies any fevers, night sweats, weight loss, or chills.     REVIEW OF SYSTEMS:  Review of Systems  Constitutional: Positive for fever.  Neurological: Positive for extremity weakness.  All other systems reviewed and are negative.    PAST MEDICAL/SURGICAL HISTORY:  Past Medical History:  Diagnosis Date  . Anemia associated with stage 3 chronic renal failure ([place]) 04/13/2016  . CAD (coronary artery disease)   . COPD (chronic obstructive pulmonary disease) ([place])   . History of tobacco abuse   . Hyperlipidemia   . Hypertension   . 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 Multiple myeloma ([place])   . Vitamin B12 deficiency 04/15/2016   Overview:  Vitamin B12 level documented 155, January 2016 with the normal range being 211-924   Past Surgical History:  Procedure Laterality Date  . BACK SURGERY    . CARDIAC CATHETERIZATION  04/2011   right and left cath showing normal right heart pressures,but newly diagnosed coronary artery disease/drug eluting stent placed to RCA with residual disease in the proximal RCA and LAD and ramus, normal LV function and 60-65% EF  . COLONOSCOPY N/A 09/30/2014   Procedure: COLONOSCOPY;  Surgeon: Rogene Houston, MD;  Location: [facility];  Service: Endoscopy;  Laterality: N/A;  225  . CORONARY ANGIOPLASTY WITH STENT PLACEMENT  04/2011   mid RCA:  3.0 X38 mm Promus DES. Residual 40% disease proximally  . NECK SURGERY       SOCIAL HISTORY:  Social History   Socioeconomic History  . Marital status: Widowed    Spouse name: Not on file  . Number of children: Not on file  . Years of education: Not on file  . Highest education level: Not on file  Occupational History  . 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 Financial resource strain: Not on file  . Food insecurity:    Worry: Not  on file    Inability: Not on file  . Transportation needs:    Medical: Not on file    Non-medical: Not on file  Tobacco Use  . Smoking status: Former Smoker    Packs/day: 3.00    Years: 25.00    Pack years: 75.00    Types: Cigarettes    Last attempt to quit: 07/10/1994    Years since quitting: 23.7  . Smokeless tobacco: Never Used  Substance and Sexual Activity  . Alcohol use: No  . Drug use: Not on file  . Sexual activity: Not on file  Lifestyle  . Physical activity:    Days per week: Not on file    Minutes per session: Not on file  . Stress: Not on file  Relationships  . Social connections:    Talks on phone: Not on file    Gets together: Not on file    Attends religious service: Not on file    Active member of club or organization: Not on file    Attends meetings of clubs or organizations: Not on file    Relationship status: Not on file  . Intimate partner violence:    Fear of current or ex partner: Not on file    Emotionally abused: Not on file    Physically abused: Not on file    Forced sexual activity: Not on file  Other Topics Concern  . Not on file  Social History Narrative  . Not on file    FAMILY HISTORY:  Family History  Problem Relation Age of Onset  . Heart failure Mother   . Cancer Father     CURRENT MEDICATIONS:  Outpatient Encounter Medications as of 04/15/2018  Medication Sig  . acyclovir (ZOVIRAX) 200 MG capsule Take 1 capsule (200 mg total) by mouth 2 (two) times daily.  . amoxicillin (AMOXIL) 500 MG capsule TAKE ONE CAPSULE BY MOUTH THREE TIMES DAILY. TAKE WITH FOOD.  . aspirin EC 81 MG tablet Take 1 tablet (81 mg total) by mouth daily.  . atorvastatin (LIPITOR) 10 MG tablet TAKE ONE TABLET BY MOUTH DAILY  . Calcium Carbonate-Vit D-Min (CALCIUM 600+D PLUS MINERALS) 600-400 MG-UNIT CHEW Chew 1 tablet by mouth 3 (three) times daily.   . cefUROXime (CEFTIN) 250 MG tablet Take 250 mg by mouth 2 (two) times daily.  . cyanocobalamin (,VITAMIN  B-12,) 1000 MCG/ML injection Inject 1,000 mcg as directed every 30 (thirty) days.  . dexamethasone (DECADRON) 2 MG tablet Take 10 mg (5 tablets) by mouth weekly  . diazepam (VALIUM) 5 MG tablet Take 1 tablet (5 mg total) by mouth every 6 (six) hours as needed for anxiety.  . diltiazem (CARDIZEM CD) 240 MG 24 hr capsule TAKE ONE CAPSULE BY MOUTH DAILY  . guaiFENesin (MUCINEX) 600 MG 12 hr tablet Take 600 mg 2 (two) times daily as needed by mouth for cough or to loosen phlegm.  .   losartan (COZAAR) 50 MG tablet TAKE ONE TABLET BY MOUTH DAILY  . magnesium oxide (MAG-OX) 400 (241.3 Mg) MG tablet TAKE ONE TABLET BY MOUTH TWICE DAILY. WHEN TAKING LASIX (Patient taking differently: Take 400 mg by mouth daily. TAKE ONE TABLET BY MOUTH TWICE DAILY. WHEN TAKING LASIX)  . metoprolol tartrate (LOPRESSOR) 25 MG tablet TAKE ONE TABLET BY MOUTH TWICE DAILY  . ondansetron (ZOFRAN) 8 MG tablet Take 1 tablet (8 mg total) by mouth every 8 (eight) hours as needed for nausea or vomiting.  . pomalidomide (POMALYST) 2 MG capsule Take 1 capsule (2 mg total) by mouth daily. Take with water on days 1-21. Repeat every 28 days.  . potassium chloride (K-DUR,KLOR-CON) 10 MEQ tablet Take 1 tablet (10 mEq total) by mouth 3 (three) times daily. (Patient taking differently: Take 10 mEq by mouth once. )  . [DISCONTINUED] dexamethasone (DECADRON) 4 MG tablet TAKE TWO & ONE-HALF (2 & 1/2) TABLETS (50 MG) BY MOUTH EVERY WEEK.   Facility-Administered Encounter Medications as of 04/15/2018  Medication  . cyanocobalamin ((VITAMIN B-12)) injection 1,000 mcg    ALLERGIES:  Allergies  Allergen Reactions  . Lisinopril Cough  . Plavix [Clopidogrel Bisulfate] Swelling     PHYSICAL EXAM:  ECOG Performance status: 1  Vitals:   04/15/18 1032  BP: (!) 138/57  Pulse: 64  Resp: 18  Temp: 97.8 F (36.6 C)  SpO2: 99%   Filed Weights   04/15/18 1032  Weight: 160 lb (72.6 kg)    Physical Exam  Constitutional: She is oriented to  person, place, and time. She appears well-developed and well-nourished.  Cardiovascular: Normal rate, regular rhythm and normal heart sounds.  Pulmonary/Chest: Effort normal and breath sounds normal.  Musculoskeletal: Normal range of motion.  Neurological: She is alert and oriented to person, place, and time.  Skin: Skin is warm and dry.  Psychiatric: She has a normal mood and affect. Her behavior is normal. Judgment and thought content normal.     LABORATORY DATA:  I have reviewed the labs as listed.  CBC    Component Value Date/Time   WBC 7.4 04/15/2018 0909   RBC 3.37 (L) 04/15/2018 0909   HGB 9.8 (L) 04/15/2018 0909   HCT 33.1 (L) 04/15/2018 0909   PLT 344 04/15/2018 0909   MCV 98.2 04/15/2018 0909   MCH 29.1 04/15/2018 0909   MCHC 29.6 (L) 04/15/2018 0909   RDW 16.1 (H) 04/15/2018 0909   LYMPHSABS 1.2 04/15/2018 0909   MONOABS 1.7 (H) 04/15/2018 0909   EOSABS 0.1 04/15/2018 0909   BASOSABS 0.1 04/15/2018 0909   CMP Latest Ref Rng & Units 04/15/2018 03/08/2018 03/04/2018  Glucose 70 - 99 mg/dL 100(H) 121(H) 122(H)  BUN 8 - 23 mg/dL 23 29(H) 21  Creatinine 0.44 - 1.00 mg/dL 1.21(H) 1.14(H) 1.15(H)  Sodium 135 - 145 mmol/L 140 140 142  Potassium 3.5 - 5.1 mmol/L 3.9 4.2 3.5  Chloride 98 - 111 mmol/L 107 107 106  CO2 22 - 32 mmol/L _0 Calcium 8.9 - 10.3 mg/dL 8.8(L) 8.8(L) 8.3(L)  Total Protein 6.5 - 8.1 g/dL 6.6 6.4(L) 6.0(L)  Total Bilirubin 0.3 - 1.2 mg/dL 0.5 0.6 0.6  Alkaline Phos 38 - 126 U/L 72 71 69  AST 15 - 41 U/L _1 ALT 0 - 44 U/L _2 ASSESSMENT & PLAN:   Multiple myeloma ([facility]) 1. 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 gain, -13 -Status post Velcade and dexamethasone from 03/18/2015 through 07/28/2015, Velcade discontinued secondary to neuropathy -  Revlimid and dexamethasone from February 2017 through November 2017, achieving negative SPEP and immunofixation -Seen by Dr. Rodriguez for second opinion, bone marrow biopsy on 11/14/2016 shows less than 2% plasma cells, maintenance Velcade started in May 2018, held in January 2019 due to diarrhea -Noted to have biochemical relapse with M spike of 0.5, recommended to start on reduced dose pomalidomide, Ninlaro and dexamethasone -Started first cycle of Pomalyst 2 mg (days 1-21 every 28 days), ixazomib 2.3 mg (days 1, 8, 15 every 28 days), and Dexamethasone 10 mg (weekly) on 08/24/2017 due to biochemical relapse.  She has tolerated it reasonably well although she felt somewhat tired.  She felt severely tired after cycle 2.  Ninlaro was held during third cycle due to excessive tiredness.  Cycle 4 of pomalidomide and dexamethasone started on 11/26/2017.  She is tolerating it very well without any side effects.  Her M spike on 11/23/2017 has come down to 0.2 from 0.4 on 10/29/2017.  Light chain ratio is stable at 2.4.  Kappa light chains have come down from 83-46. - She was seen by Dr. Rodriguez on 12/10/2017 and was recommended to cut back on dexamethasone 10 mg to every other week.  She also underwent cardiology evaluation for work-up of shortness of breath which were negative. - She is currently taking pomalidomide 2 mg 3 weeks on 1 week off.  Dexamethasone is being taken 10 mg every other week.  Ninlaro was on hold since last visit on 02/11/2018. - Her M spike from 03/08/2018 was 0.2.  Free light chain ratio was 2.02.  Kappa light chains were elevated at 47. -I have recommended her to start back on Ninlaro 2.3 mg.  She will also start pomalidomide today.  She will continue dexamethasone 10 mg every other week. -She will receive flu injection today.  She will come back in 2 months for follow-up.  2.  Bone protection: She is receiving zoledronic acid 3 mg every 3 months.  Last Zometa was on 04/15/2018.  3.  Peripheral  neuropathy: She has numbness in the feet which has been stable.  This is from prior Velcade exposure.  4.  Normocytic anemia: - This is from underlying CKD and iron deficiency.  Her hemoglobin today dropped to 9.8.  Her iron is also low.  I have recommended 2 weekly infusions of Feraheme.         Orders placed this encounter:  Orders Placed This Encounter  Procedures  . Protein electrophoresis, serum  . Immunofixation electrophoresis  . Kappa/lambda light chains  . Lactate dehydrogenase  . CBC with Differential/Platelet  . Comprehensive metabolic panel      Sreedhar Katragadda, MD [facility] [phone]  

## 2018-04-16 ENCOUNTER — Inpatient Hospital Stay (HOSPITAL_COMMUNITY): Payer: Medicare Other

## 2018-04-16 VITALS — BP 135/60 | HR 64 | Temp 97.6°F | Resp 18

## 2018-04-16 DIAGNOSIS — D509 Iron deficiency anemia, unspecified: Secondary | ICD-10-CM

## 2018-04-16 DIAGNOSIS — D631 Anemia in chronic kidney disease: Secondary | ICD-10-CM

## 2018-04-16 DIAGNOSIS — E538 Deficiency of other specified B group vitamins: Secondary | ICD-10-CM

## 2018-04-16 DIAGNOSIS — N183 Chronic kidney disease, stage 3 unspecified: Secondary | ICD-10-CM

## 2018-04-16 DIAGNOSIS — C9000 Multiple myeloma not having achieved remission: Secondary | ICD-10-CM | POA: Diagnosis not present

## 2018-04-16 LAB — PROTEIN ELECTROPHORESIS, SERUM
A/G RATIO SPE: 1 (ref 0.7–1.7)
ALBUMIN ELP: 2.9 g/dL (ref 2.9–4.4)
Alpha-1-Globulin: 0.3 g/dL (ref 0.0–0.4)
Alpha-2-Globulin: 0.9 g/dL (ref 0.4–1.0)
Beta Globulin: 0.8 g/dL (ref 0.7–1.3)
Gamma Globulin: 0.9 g/dL (ref 0.4–1.8)
Globulin, Total: 2.9 g/dL (ref 2.2–3.9)
M-Spike, %: 0.3 g/dL — ABNORMAL HIGH
TOTAL PROTEIN ELP: 5.8 g/dL — AB (ref 6.0–8.5)

## 2018-04-16 LAB — KAPPA/LAMBDA LIGHT CHAINS
KAPPA FREE LGHT CHN: 65.9 mg/L — AB (ref 3.3–19.4)
KAPPA, LAMDA LIGHT CHAIN RATIO: 2.11 — AB (ref 0.26–1.65)
Lambda free light chains: 31.2 mg/L — ABNORMAL HIGH (ref 5.7–26.3)

## 2018-04-16 MED ORDER — DARBEPOETIN ALFA 300 MCG/0.6ML IJ SOSY
300.0000 ug | PREFILLED_SYRINGE | Freq: Once | INTRAMUSCULAR | Status: AC
  Administered 2018-04-16: 300 ug via SUBCUTANEOUS

## 2018-04-16 MED ORDER — DARBEPOETIN ALFA 300 MCG/0.6ML IJ SOSY
PREFILLED_SYRINGE | INTRAMUSCULAR | Status: AC
  Filled 2018-04-16: qty 0.6

## 2018-04-16 NOTE — Progress Notes (Signed)
Carol Bradley presents today for injection per the provider's orders.  Aranesp administration without incident; see MAR for injection details.  Patient tolerated procedure well and without incident.  No questions or complaints noted at this time.  Discharged ambulatory.  

## 2018-04-22 ENCOUNTER — Encounter (HOSPITAL_COMMUNITY): Payer: Self-pay

## 2018-04-22 ENCOUNTER — Inpatient Hospital Stay (HOSPITAL_COMMUNITY): Payer: Medicare Other

## 2018-04-22 ENCOUNTER — Other Ambulatory Visit: Payer: Self-pay

## 2018-04-22 VITALS — BP 125/47 | HR 73 | Temp 97.5°F | Resp 18

## 2018-04-22 DIAGNOSIS — D631 Anemia in chronic kidney disease: Secondary | ICD-10-CM

## 2018-04-22 DIAGNOSIS — N183 Chronic kidney disease, stage 3 unspecified: Secondary | ICD-10-CM

## 2018-04-22 DIAGNOSIS — D509 Iron deficiency anemia, unspecified: Secondary | ICD-10-CM

## 2018-04-22 DIAGNOSIS — E538 Deficiency of other specified B group vitamins: Secondary | ICD-10-CM

## 2018-04-22 DIAGNOSIS — C9000 Multiple myeloma not having achieved remission: Secondary | ICD-10-CM | POA: Diagnosis not present

## 2018-04-22 MED ORDER — SODIUM CHLORIDE 0.9 % IV SOLN
INTRAVENOUS | Status: DC
  Administered 2018-04-22: 12:00:00 via INTRAVENOUS

## 2018-04-22 MED ORDER — SODIUM CHLORIDE 0.9 % IV SOLN
510.0000 mg | Freq: Once | INTRAVENOUS | Status: AC
  Administered 2018-04-22: 510 mg via INTRAVENOUS
  Filled 2018-04-22: qty 17

## 2018-04-22 NOTE — Progress Notes (Signed)
Tolerated infusion w/o adverse reaction.  Alert, in no distress.  VSS.  Discharged ambulatory in c/o son.  

## 2018-04-30 ENCOUNTER — Other Ambulatory Visit (HOSPITAL_COMMUNITY): Payer: Self-pay | Admitting: Hematology

## 2018-04-30 ENCOUNTER — Inpatient Hospital Stay (HOSPITAL_COMMUNITY): Payer: Medicare Other

## 2018-04-30 DIAGNOSIS — D509 Iron deficiency anemia, unspecified: Secondary | ICD-10-CM

## 2018-04-30 DIAGNOSIS — N183 Chronic kidney disease, stage 3 unspecified: Secondary | ICD-10-CM

## 2018-04-30 DIAGNOSIS — C9000 Multiple myeloma not having achieved remission: Secondary | ICD-10-CM | POA: Diagnosis not present

## 2018-04-30 DIAGNOSIS — D631 Anemia in chronic kidney disease: Secondary | ICD-10-CM

## 2018-04-30 LAB — CBC WITH DIFFERENTIAL/PLATELET
Abs Immature Granulocytes: 0.05 10*3/uL (ref 0.00–0.07)
BASOS ABS: 0.1 10*3/uL (ref 0.0–0.1)
Basophils Relative: 0 %
EOS ABS: 0.3 10*3/uL (ref 0.0–0.5)
EOS PCT: 2 %
HCT: 40 % (ref 36.0–46.0)
HEMOGLOBIN: 12.1 g/dL (ref 12.0–15.0)
Immature Granulocytes: 0 %
LYMPHS PCT: 10 %
Lymphs Abs: 1.1 10*3/uL (ref 0.7–4.0)
MCH: 31.9 pg (ref 26.0–34.0)
MCHC: 30.3 g/dL (ref 30.0–36.0)
MCV: 105.5 fL — ABNORMAL HIGH (ref 80.0–100.0)
MONO ABS: 2 10*3/uL — AB (ref 0.1–1.0)
Monocytes Relative: 17 %
NRBC: 0 % (ref 0.0–0.2)
Neutro Abs: 8.2 10*3/uL — ABNORMAL HIGH (ref 1.7–7.7)
Neutrophils Relative %: 71 %
Platelets: 231 10*3/uL (ref 150–400)
RBC: 3.79 MIL/uL — ABNORMAL LOW (ref 3.87–5.11)
RDW: 19.8 % — AB (ref 11.5–15.5)
WBC: 11.6 10*3/uL — AB (ref 4.0–10.5)

## 2018-04-30 NOTE — Progress Notes (Signed)
Hgb 12.1 and patient does not need Aranesp shot today.  Reminded the patient to keep next scheduled appointment.

## 2018-05-06 ENCOUNTER — Other Ambulatory Visit (HOSPITAL_COMMUNITY): Payer: Self-pay | Admitting: *Deleted

## 2018-05-06 DIAGNOSIS — C9000 Multiple myeloma not having achieved remission: Secondary | ICD-10-CM

## 2018-05-06 MED ORDER — POMALIDOMIDE 2 MG PO CAPS: 2.0000 mg | ORAL_CAPSULE | Freq: Every day | ORAL | 0 refills | Status: DC

## 2018-05-06 NOTE — Telephone Encounter (Signed)
Chart reviewed, per Dr. Tomie China last office note, pomalyst refilled.

## 2018-05-07 ENCOUNTER — Other Ambulatory Visit (HOSPITAL_COMMUNITY): Payer: Self-pay | Admitting: *Deleted

## 2018-05-07 DIAGNOSIS — C9000 Multiple myeloma not having achieved remission: Secondary | ICD-10-CM

## 2018-05-07 MED ORDER — DEXAMETHASONE 2 MG PO TABS: ORAL_TABLET | ORAL | 3 refills | Status: DC

## 2018-05-07 NOTE — Addendum Note (Signed)
Addended by: Vena Austria on: 05/07/2018 01:05 PM   Modules accepted: Orders

## 2018-05-13 ENCOUNTER — Ambulatory Visit (HOSPITAL_COMMUNITY): Payer: Medicare Other

## 2018-05-14 ENCOUNTER — Inpatient Hospital Stay (HOSPITAL_COMMUNITY): Payer: Medicare Other | Attending: Hematology

## 2018-05-14 ENCOUNTER — Inpatient Hospital Stay (HOSPITAL_COMMUNITY): Payer: Medicare Other

## 2018-05-14 VITALS — BP 125/51 | HR 74 | Temp 97.8°F | Resp 16

## 2018-05-14 DIAGNOSIS — D631 Anemia in chronic kidney disease: Secondary | ICD-10-CM | POA: Insufficient documentation

## 2018-05-14 DIAGNOSIS — N183 Chronic kidney disease, stage 3 (moderate): Secondary | ICD-10-CM | POA: Diagnosis not present

## 2018-05-14 DIAGNOSIS — I129 Hypertensive chronic kidney disease with stage 1 through stage 4 chronic kidney disease, or unspecified chronic kidney disease: Secondary | ICD-10-CM | POA: Insufficient documentation

## 2018-05-14 DIAGNOSIS — D509 Iron deficiency anemia, unspecified: Secondary | ICD-10-CM

## 2018-05-14 DIAGNOSIS — E538 Deficiency of other specified B group vitamins: Secondary | ICD-10-CM

## 2018-05-14 DIAGNOSIS — C9000 Multiple myeloma not having achieved remission: Secondary | ICD-10-CM | POA: Diagnosis not present

## 2018-05-14 LAB — CBC
HCT: 36.9 % (ref 36.0–46.0)
HEMOGLOBIN: 11.3 g/dL — AB (ref 12.0–15.0)
MCH: 31 pg (ref 26.0–34.0)
MCHC: 30.6 g/dL (ref 30.0–36.0)
MCV: 101.1 fL — ABNORMAL HIGH (ref 80.0–100.0)
Platelets: 241 10*3/uL (ref 150–400)
RBC: 3.65 MIL/uL — AB (ref 3.87–5.11)
RDW: 18.4 % — ABNORMAL HIGH (ref 11.5–15.5)
WBC: 6.2 10*3/uL (ref 4.0–10.5)
nRBC: 0 % (ref 0.0–0.2)

## 2018-05-14 MED ORDER — CYANOCOBALAMIN 1000 MCG/ML IJ SOLN
INTRAMUSCULAR | Status: AC
  Filled 2018-05-14: qty 1

## 2018-05-14 MED ORDER — CYANOCOBALAMIN 1000 MCG/ML IJ SOLN
1000.0000 ug | Freq: Once | INTRAMUSCULAR | Status: AC
  Administered 2018-05-14: 1000 ug via INTRAMUSCULAR

## 2018-05-14 NOTE — Patient Instructions (Signed)
[place] at [facility]   B12 injection given today. Aranesp help for hgb 11.3. _______________________________________________________________  Thank you for choosing [place] at [facility] to provide your oncology and hematology care.  To afford each patient quality time with our providers, please arrive at least 15 minutes before your scheduled appointment.  You need to re-schedule your appointment if you arrive 10 or more minutes late.  We strive to give you quality time with our providers, and arriving late affects you and other patients whose appointments are after yours.  Also, if you no show three or more times for appointments you may be dismissed from the clinic.  Again, thank you for choosing [place] at [place] hope is that these requests will allow you access to exceptional care and in a timely manner. _______________________________________________________________  If you have questions after your visit, please contact our office at [phone] between the hours of 8:30 a.m. and 5:00 p.m. Voicemails left after 4:30 p.m. will not be returned until the following business day. _______________________________________________________________  For prescription refill requests, have your pharmacy contact our office. _______________________________________________________________  Recommendations made by the consultant and any test results will be sent to your referring physician. _______________________________________________________________

## 2018-05-14 NOTE — Progress Notes (Signed)
Carol Bradley presents today for Aranesp injection. Aranesp held per parameters for hgb 11.3. B12 injection given without incident or complaint. Discharged self ambulatory in satisfactory condition.

## 2018-05-23 ENCOUNTER — Other Ambulatory Visit (HOSPITAL_COMMUNITY): Payer: Self-pay

## 2018-05-23 DIAGNOSIS — D631 Anemia in chronic kidney disease: Secondary | ICD-10-CM

## 2018-05-23 DIAGNOSIS — N183 Chronic kidney disease, stage 3 unspecified: Secondary | ICD-10-CM

## 2018-05-23 DIAGNOSIS — C9000 Multiple myeloma not having achieved remission: Secondary | ICD-10-CM

## 2018-05-28 ENCOUNTER — Inpatient Hospital Stay (HOSPITAL_COMMUNITY): Payer: Medicare Other

## 2018-05-28 ENCOUNTER — Encounter (HOSPITAL_COMMUNITY): Payer: Self-pay

## 2018-05-28 VITALS — BP 115/52 | HR 80 | Temp 97.7°F | Resp 18

## 2018-05-28 DIAGNOSIS — N183 Chronic kidney disease, stage 3 unspecified: Secondary | ICD-10-CM

## 2018-05-28 DIAGNOSIS — C9000 Multiple myeloma not having achieved remission: Secondary | ICD-10-CM

## 2018-05-28 DIAGNOSIS — J0191 Acute recurrent sinusitis, unspecified: Secondary | ICD-10-CM

## 2018-05-28 DIAGNOSIS — D631 Anemia in chronic kidney disease: Secondary | ICD-10-CM

## 2018-05-28 DIAGNOSIS — D801 Nonfamilial hypogammaglobulinemia: Secondary | ICD-10-CM

## 2018-05-28 DIAGNOSIS — E538 Deficiency of other specified B group vitamins: Secondary | ICD-10-CM

## 2018-05-28 DIAGNOSIS — D509 Iron deficiency anemia, unspecified: Secondary | ICD-10-CM

## 2018-05-28 LAB — CBC
HCT: 35 % — ABNORMAL LOW (ref 36.0–46.0)
HEMOGLOBIN: 10.9 g/dL — AB (ref 12.0–15.0)
MCH: 31.8 pg (ref 26.0–34.0)
MCHC: 31.1 g/dL (ref 30.0–36.0)
MCV: 102 fL — ABNORMAL HIGH (ref 80.0–100.0)
Platelets: 224 10*3/uL (ref 150–400)
RBC: 3.43 MIL/uL — AB (ref 3.87–5.11)
RDW: 18.1 % — ABNORMAL HIGH (ref 11.5–15.5)
WBC: 11.6 10*3/uL — ABNORMAL HIGH (ref 4.0–10.5)
nRBC: 0 % (ref 0.0–0.2)

## 2018-05-28 MED ORDER — AMOXICILLIN 500 MG PO CAPS: 500.0000 mg | ORAL_CAPSULE | Freq: Three times a day (TID) | ORAL | 0 refills | Status: DC

## 2018-05-28 MED ORDER — DARBEPOETIN ALFA 300 MCG/0.6ML IJ SOSY
300.0000 ug | PREFILLED_SYRINGE | Freq: Once | INTRAMUSCULAR | Status: AC
  Administered 2018-05-28: 300 ug via SUBCUTANEOUS
  Filled 2018-05-28: qty 0.6

## 2018-05-28 NOTE — Progress Notes (Signed)
Kayia Billinger Tungate tolerated Aranesp injection well without complaints or incident. Hgb 10.9 today. Pt did complain of a sinus infection. She denies any fever,chills or cough but does report greenish-yellow nasal drainage. Evonnie Pat NP was made aware and antibiotic was e-scribed to [place] [facility] by NP. Pt discharged self ambulatory using cane in  satisfactory condition accompanied by a friend

## 2018-05-28 NOTE — Patient Instructions (Signed)
[facility] at [facility] Discharge Instructions  Received Aranesp injection today. Follow-up as scheduled. Call clinic for any questions or concerns   Thank you for choosing [facility] at [facility] to provide your oncology and hematology care.  To afford each patient quality time with our provider, please arrive at least 15 minutes before your scheduled appointment time.   If you have a lab appointment with the Cancer Center please come in thru the  Main Entrance and check in at the main information desk  You need to re-schedule your appointment should you arrive 10 or more minutes late.  We strive to give you quality time with our providers, and arriving late affects you and other patients whose appointments are after yours.  Also, if you no show three or more times for appointments you may be dismissed from the clinic at the providers discretion.     Again, thank you for choosing [facility].  Our hope is that these requests will decrease the amount of time that you wait before being seen by our physicians.       _____________________________________________________________  Should you have questions after your visit to [facility], please contact our office at [phone] between the hours of 8:00 a.m. and 4:30 p.m.  Voicemails left after 4:00 p.m. will not be returned until the following business day.  For prescription refill requests, have your pharmacy contact our office and allow 72 hours.    Cancer Center Support Programs:   > Cancer Support Group  2nd Tuesday of the month 1pm-2pm, Journey Room   

## 2018-05-29 ENCOUNTER — Other Ambulatory Visit (HOSPITAL_COMMUNITY): Payer: Self-pay | Admitting: Hematology

## 2018-05-29 DIAGNOSIS — C9001 Multiple myeloma in remission: Secondary | ICD-10-CM

## 2018-06-03 ENCOUNTER — Other Ambulatory Visit (HOSPITAL_COMMUNITY): Payer: Self-pay | Admitting: *Deleted

## 2018-06-03 DIAGNOSIS — C9000 Multiple myeloma not having achieved remission: Secondary | ICD-10-CM

## 2018-06-03 MED ORDER — POMALIDOMIDE 2 MG PO CAPS: 2.0000 mg | ORAL_CAPSULE | Freq: Every day | ORAL | 0 refills | Status: DC

## 2018-06-03 NOTE — Telephone Encounter (Signed)
Chart reviewed, pomalyst refilled.  

## 2018-06-10 ENCOUNTER — Ambulatory Visit (HOSPITAL_COMMUNITY): Payer: Medicare Other

## 2018-06-11 ENCOUNTER — Ambulatory Visit (HOSPITAL_COMMUNITY): Payer: Medicare Other

## 2018-06-11 ENCOUNTER — Other Ambulatory Visit (HOSPITAL_COMMUNITY): Payer: Medicare Other

## 2018-06-11 ENCOUNTER — Ambulatory Visit (HOSPITAL_COMMUNITY): Payer: Medicare Other | Admitting: Hematology

## 2018-06-12 ENCOUNTER — Other Ambulatory Visit: Payer: Self-pay

## 2018-06-12 ENCOUNTER — Encounter (HOSPITAL_COMMUNITY): Payer: Self-pay

## 2018-06-12 ENCOUNTER — Inpatient Hospital Stay (HOSPITAL_COMMUNITY): Payer: Medicare Other | Attending: Hematology

## 2018-06-12 VITALS — BP 121/54 | HR 75 | Temp 97.8°F | Resp 18

## 2018-06-12 DIAGNOSIS — D631 Anemia in chronic kidney disease: Secondary | ICD-10-CM

## 2018-06-12 DIAGNOSIS — N189 Chronic kidney disease, unspecified: Secondary | ICD-10-CM | POA: Insufficient documentation

## 2018-06-12 DIAGNOSIS — D509 Iron deficiency anemia, unspecified: Secondary | ICD-10-CM | POA: Diagnosis not present

## 2018-06-12 DIAGNOSIS — C9000 Multiple myeloma not having achieved remission: Secondary | ICD-10-CM | POA: Insufficient documentation

## 2018-06-12 DIAGNOSIS — E538 Deficiency of other specified B group vitamins: Secondary | ICD-10-CM | POA: Diagnosis not present

## 2018-06-12 DIAGNOSIS — N183 Chronic kidney disease, stage 3 (moderate): Secondary | ICD-10-CM

## 2018-06-12 DIAGNOSIS — I129 Hypertensive chronic kidney disease with stage 1 through stage 4 chronic kidney disease, or unspecified chronic kidney disease: Secondary | ICD-10-CM | POA: Insufficient documentation

## 2018-06-12 MED ORDER — CYANOCOBALAMIN 1000 MCG/ML IJ SOLN
INTRAMUSCULAR | Status: AC
  Filled 2018-06-12: qty 1

## 2018-06-12 MED ORDER — CYANOCOBALAMIN 1000 MCG/ML IJ SOLN
1000.0000 ug | Freq: Once | INTRAMUSCULAR | Status: AC
  Administered 2018-06-12: 1000 ug via INTRAMUSCULAR

## 2018-06-12 NOTE — Progress Notes (Signed)
Carol Bradley presents today for injection per the provider's orders.  B12 administration without incident; see MAR for injection details.  Patient tolerated procedure well and without incident.  No questions or complaints noted at this time.  Discharged ambulatory.

## 2018-06-25 ENCOUNTER — Inpatient Hospital Stay (HOSPITAL_COMMUNITY): Payer: Medicare Other

## 2018-06-25 ENCOUNTER — Encounter (HOSPITAL_COMMUNITY): Payer: Self-pay

## 2018-06-25 ENCOUNTER — Other Ambulatory Visit: Payer: Self-pay

## 2018-06-25 VITALS — BP 103/44 | HR 77 | Temp 97.9°F | Resp 18

## 2018-06-25 DIAGNOSIS — D801 Nonfamilial hypogammaglobulinemia: Secondary | ICD-10-CM

## 2018-06-25 DIAGNOSIS — N183 Chronic kidney disease, stage 3 (moderate): Principal | ICD-10-CM

## 2018-06-25 DIAGNOSIS — E538 Deficiency of other specified B group vitamins: Secondary | ICD-10-CM

## 2018-06-25 DIAGNOSIS — D631 Anemia in chronic kidney disease: Secondary | ICD-10-CM

## 2018-06-25 DIAGNOSIS — C9000 Multiple myeloma not having achieved remission: Secondary | ICD-10-CM | POA: Diagnosis not present

## 2018-06-25 DIAGNOSIS — N189 Chronic kidney disease, unspecified: Secondary | ICD-10-CM | POA: Diagnosis not present

## 2018-06-25 DIAGNOSIS — D509 Iron deficiency anemia, unspecified: Secondary | ICD-10-CM

## 2018-06-25 LAB — CBC
HCT: 34.7 % — ABNORMAL LOW (ref 36.0–46.0)
Hemoglobin: 10.7 g/dL — ABNORMAL LOW (ref 12.0–15.0)
MCH: 32.2 pg (ref 26.0–34.0)
MCHC: 30.8 g/dL (ref 30.0–36.0)
MCV: 104.5 fL — AB (ref 80.0–100.0)
PLATELETS: 285 10*3/uL (ref 150–400)
RBC: 3.32 MIL/uL — AB (ref 3.87–5.11)
RDW: 16.7 % — ABNORMAL HIGH (ref 11.5–15.5)
WBC: 10 10*3/uL (ref 4.0–10.5)
nRBC: 0 % (ref 0.0–0.2)

## 2018-06-25 MED ORDER — DARBEPOETIN ALFA 300 MCG/0.6ML IJ SOSY
300.0000 ug | PREFILLED_SYRINGE | Freq: Once | INTRAMUSCULAR | Status: AC
  Administered 2018-06-25: 300 ug via SUBCUTANEOUS

## 2018-06-25 MED ORDER — DARBEPOETIN ALFA 300 MCG/0.6ML IJ SOSY
PREFILLED_SYRINGE | INTRAMUSCULAR | Status: AC
  Filled 2018-06-25: qty 0.6

## 2018-06-25 NOTE — Progress Notes (Signed)
Carol Bradley presents today for injection per the provider's orders.  Aranesp administration without incident; see MAR for injection details.  Patient tolerated procedure well and without incident.  No questions or complaints noted at this time.  Discharged ambulatory.

## 2018-07-02 NOTE — Progress Notes (Signed)
This encounter was created in error - please disregard.

## 2018-07-08 ENCOUNTER — Encounter (HOSPITAL_COMMUNITY): Payer: Self-pay

## 2018-07-08 ENCOUNTER — Ambulatory Visit (HOSPITAL_COMMUNITY): Payer: Medicare Other

## 2018-07-08 ENCOUNTER — Other Ambulatory Visit: Payer: Self-pay

## 2018-07-08 ENCOUNTER — Inpatient Hospital Stay (HOSPITAL_COMMUNITY): Payer: Medicare Other

## 2018-07-08 ENCOUNTER — Other Ambulatory Visit (HOSPITAL_COMMUNITY): Payer: Medicare Other

## 2018-07-08 VITALS — BP 120/50 | HR 60 | Temp 97.8°F | Resp 18

## 2018-07-08 DIAGNOSIS — C9000 Multiple myeloma not having achieved remission: Secondary | ICD-10-CM | POA: Diagnosis not present

## 2018-07-08 DIAGNOSIS — E538 Deficiency of other specified B group vitamins: Secondary | ICD-10-CM

## 2018-07-08 DIAGNOSIS — N189 Chronic kidney disease, unspecified: Secondary | ICD-10-CM | POA: Diagnosis not present

## 2018-07-08 MED ORDER — CYANOCOBALAMIN 1000 MCG/ML IJ SOLN
1000.0000 ug | Freq: Once | INTRAMUSCULAR | Status: AC
  Administered 2018-07-08: 1000 ug via INTRAMUSCULAR
  Filled 2018-07-08: qty 1

## 2018-07-08 NOTE — Progress Notes (Signed)
Carol Bradley presents today for injection per the provider's orders.  b12 administration without incident; see MAR for injection details.  Patient tolerated procedure well and without incident.  No questions or complaints noted at this time. Discharged ambulatory

## 2018-07-09 ENCOUNTER — Inpatient Hospital Stay (HOSPITAL_COMMUNITY): Payer: Medicare Other

## 2018-07-09 ENCOUNTER — Ambulatory Visit (HOSPITAL_COMMUNITY): Payer: Medicare Other

## 2018-07-09 ENCOUNTER — Other Ambulatory Visit (HOSPITAL_COMMUNITY): Payer: Medicare Other

## 2018-07-09 ENCOUNTER — Encounter (HOSPITAL_COMMUNITY): Payer: Self-pay

## 2018-07-09 VITALS — BP 123/47 | HR 62 | Temp 97.8°F | Resp 18 | Wt 161.8 lb

## 2018-07-09 DIAGNOSIS — C9000 Multiple myeloma not having achieved remission: Secondary | ICD-10-CM | POA: Diagnosis not present

## 2018-07-09 DIAGNOSIS — D509 Iron deficiency anemia, unspecified: Secondary | ICD-10-CM

## 2018-07-09 DIAGNOSIS — N189 Chronic kidney disease, unspecified: Secondary | ICD-10-CM | POA: Diagnosis not present

## 2018-07-09 LAB — CBC WITH DIFFERENTIAL/PLATELET
Abs Immature Granulocytes: 0.03 10*3/uL (ref 0.00–0.07)
BASOS ABS: 0 10*3/uL (ref 0.0–0.1)
Basophils Relative: 0 %
EOS PCT: 0 %
Eosinophils Absolute: 0 10*3/uL (ref 0.0–0.5)
HCT: 36 % (ref 36.0–46.0)
Hemoglobin: 11.1 g/dL — ABNORMAL LOW (ref 12.0–15.0)
Immature Granulocytes: 0 %
Lymphocytes Relative: 10 %
Lymphs Abs: 0.7 10*3/uL (ref 0.7–4.0)
MCH: 31.5 pg (ref 26.0–34.0)
MCHC: 30.8 g/dL (ref 30.0–36.0)
MCV: 102.3 fL — ABNORMAL HIGH (ref 80.0–100.0)
Monocytes Absolute: 1.3 10*3/uL — ABNORMAL HIGH (ref 0.1–1.0)
Monocytes Relative: 18 %
Neutro Abs: 5 10*3/uL (ref 1.7–7.7)
Neutrophils Relative %: 72 %
Platelets: 327 10*3/uL (ref 150–400)
RBC: 3.52 MIL/uL — ABNORMAL LOW (ref 3.87–5.11)
RDW: 16.7 % — ABNORMAL HIGH (ref 11.5–15.5)
WBC: 7 10*3/uL (ref 4.0–10.5)
nRBC: 0 % (ref 0.0–0.2)

## 2018-07-09 LAB — IRON AND TIBC
Iron: 51 ug/dL (ref 28–170)
Saturation Ratios: 18 % (ref 10.4–31.8)
TIBC: 282 ug/dL (ref 250–450)
UIBC: 231 ug/dL

## 2018-07-09 LAB — COMPREHENSIVE METABOLIC PANEL
ALT: 19 U/L (ref 0–44)
AST: 18 U/L (ref 15–41)
Albumin: 3.5 g/dL (ref 3.5–5.0)
Alkaline Phosphatase: 61 U/L (ref 38–126)
Anion gap: 9 (ref 5–15)
BUN: 28 mg/dL — ABNORMAL HIGH (ref 8–23)
CO2: 23 mmol/L (ref 22–32)
Calcium: 9 mg/dL (ref 8.9–10.3)
Chloride: 108 mmol/L (ref 98–111)
Creatinine, Ser: 1.12 mg/dL — ABNORMAL HIGH (ref 0.44–1.00)
GFR calc Af Amer: 55 mL/min — ABNORMAL LOW (ref >60)
GFR calc non Af Amer: 47 mL/min — ABNORMAL LOW (ref >60)
Glucose, Bld: 107 mg/dL — ABNORMAL HIGH (ref 70–99)
Potassium: 4.1 mmol/L (ref 3.5–5.1)
Sodium: 140 mmol/L (ref 135–145)
TOTAL PROTEIN: 6.5 g/dL (ref 6.5–8.1)
Total Bilirubin: 0.4 mg/dL (ref 0.3–1.2)

## 2018-07-09 LAB — LACTATE DEHYDROGENASE: LDH: 160 U/L (ref 98–192)

## 2018-07-09 LAB — FERRITIN: Ferritin: 71 ng/mL (ref 11–307)

## 2018-07-09 MED ORDER — ZOLEDRONIC ACID 4 MG/5ML IV CONC
3.0000 mg | Freq: Once | INTRAVENOUS | Status: AC
  Administered 2018-07-09: 3 mg via INTRAVENOUS
  Filled 2018-07-09: qty 3.75

## 2018-07-09 MED ORDER — SODIUM CHLORIDE 0.9 % IV SOLN
INTRAVENOUS | Status: DC
  Administered 2018-07-09: 14:00:00 via INTRAVENOUS

## 2018-07-09 NOTE — Patient Instructions (Signed)
[place] at [facility] Discharge Instructions  Received Zometa infusion today and Aranesp injection held. Follow-up as scheduled. Call clinic for any questions or concerns   Thank you for choosing [place] at [facility] to provide your oncology and hematology care.  To afford each patient quality time with our provider, please arrive at least 15 minutes before your scheduled appointment time.   If you have a lab appointment with the [facility] please come in thru the  Main Entrance and check in at the main information desk  You need to re-schedule your appointment should you arrive 10 or more minutes late.  We strive to give you quality time with our providers, and arriving late affects you and other patients whose appointments are after yours.  Also, if you no show three or more times for appointments you may be dismissed from the clinic at the providers discretion.     Again, thank you for choosing [facility].  Our hope is that these requests will decrease the amount of time that you wait before being seen by our physicians.       _____________________________________________________________  Should you have questions after your visit to [facility], please contact our office at [phone] between the hours of 8:00 a.m. and 4:30 p.m.  Voicemails left after 4:00 p.m. will not be returned until the following business day.  For prescription refill requests, have your pharmacy contact our office and allow 72 hours.    Cancer Center Support Programs:   > Cancer Support Group  2nd Tuesday of the month 1pm-2pm, Journey Room

## 2018-07-09 NOTE — Progress Notes (Signed)
Carol Bradley tolerated Zometa infusion well without complaints or incident. Calcium 9 today, labs reviewed with NP and pt denied any tooth or jaw pain and no recent or future dental visits prior to administering this medication. Hgb 11.1 today so Aranesp injection held per parameters.Pt missed last MD appt so NP was made aware when labs were reviewed with her today and new MD appt made VSS upon discharge. Pt discharged self ambulatory in satisfactory condition

## 2018-07-11 LAB — KAPPA/LAMBDA LIGHT CHAINS
Kappa free light chain: 35.2 mg/L — ABNORMAL HIGH (ref 3.3–19.4)
Kappa, lambda light chain ratio: 2.2 — ABNORMAL HIGH (ref 0.26–1.65)
Lambda free light chains: 16 mg/L (ref 5.7–26.3)

## 2018-07-11 LAB — PROTEIN ELECTROPHORESIS, SERUM
A/G Ratio: 1.1 (ref 0.7–1.7)
ALPHA-1-GLOBULIN: 0.3 g/dL (ref 0.0–0.4)
Albumin ELP: 3.2 g/dL (ref 2.9–4.4)
Alpha-2-Globulin: 1 g/dL (ref 0.4–1.0)
Beta Globulin: 0.8 g/dL (ref 0.7–1.3)
Gamma Globulin: 0.8 g/dL (ref 0.4–1.8)
Globulin, Total: 2.9 g/dL (ref 2.2–3.9)
M-SPIKE, %: 0.2 g/dL — AB
Total Protein ELP: 6.1 g/dL (ref 6.0–8.5)

## 2018-07-12 LAB — IMMUNOFIXATION ELECTROPHORESIS
IGM (IMMUNOGLOBULIN M), SRM: 107 mg/dL (ref 26–217)
IgA: 160 mg/dL (ref 64–422)
IgG (Immunoglobin G), Serum: 764 mg/dL (ref 700–1600)
Total Protein ELP: 6.1 g/dL (ref 6.0–8.5)

## 2018-07-23 ENCOUNTER — Inpatient Hospital Stay (HOSPITAL_COMMUNITY): Payer: Medicare Other | Attending: Hematology

## 2018-07-23 ENCOUNTER — Inpatient Hospital Stay (HOSPITAL_COMMUNITY): Payer: Medicare Other

## 2018-07-23 DIAGNOSIS — H538 Other visual disturbances: Secondary | ICD-10-CM | POA: Insufficient documentation

## 2018-07-23 DIAGNOSIS — Z79899 Other long term (current) drug therapy: Secondary | ICD-10-CM | POA: Diagnosis not present

## 2018-07-23 DIAGNOSIS — R600 Localized edema: Secondary | ICD-10-CM | POA: Insufficient documentation

## 2018-07-23 DIAGNOSIS — D509 Iron deficiency anemia, unspecified: Secondary | ICD-10-CM | POA: Diagnosis not present

## 2018-07-23 DIAGNOSIS — Z9221 Personal history of antineoplastic chemotherapy: Secondary | ICD-10-CM | POA: Insufficient documentation

## 2018-07-23 DIAGNOSIS — N189 Chronic kidney disease, unspecified: Secondary | ICD-10-CM | POA: Diagnosis not present

## 2018-07-23 DIAGNOSIS — Z7982 Long term (current) use of aspirin: Secondary | ICD-10-CM | POA: Diagnosis not present

## 2018-07-23 DIAGNOSIS — C9000 Multiple myeloma not having achieved remission: Secondary | ICD-10-CM | POA: Diagnosis not present

## 2018-07-23 DIAGNOSIS — R5383 Other fatigue: Secondary | ICD-10-CM | POA: Insufficient documentation

## 2018-07-23 DIAGNOSIS — H9209 Otalgia, unspecified ear: Secondary | ICD-10-CM | POA: Insufficient documentation

## 2018-07-23 DIAGNOSIS — Z87891 Personal history of nicotine dependence: Secondary | ICD-10-CM | POA: Diagnosis not present

## 2018-07-23 DIAGNOSIS — H269 Unspecified cataract: Secondary | ICD-10-CM | POA: Diagnosis not present

## 2018-07-23 DIAGNOSIS — I129 Hypertensive chronic kidney disease with stage 1 through stage 4 chronic kidney disease, or unspecified chronic kidney disease: Secondary | ICD-10-CM | POA: Diagnosis not present

## 2018-07-23 DIAGNOSIS — G629 Polyneuropathy, unspecified: Secondary | ICD-10-CM | POA: Diagnosis not present

## 2018-07-23 DIAGNOSIS — I2510 Atherosclerotic heart disease of native coronary artery without angina pectoris: Secondary | ICD-10-CM | POA: Diagnosis not present

## 2018-07-23 DIAGNOSIS — D631 Anemia in chronic kidney disease: Secondary | ICD-10-CM

## 2018-07-23 DIAGNOSIS — N183 Chronic kidney disease, stage 3 unspecified: Secondary | ICD-10-CM

## 2018-07-23 DIAGNOSIS — E785 Hyperlipidemia, unspecified: Secondary | ICD-10-CM | POA: Insufficient documentation

## 2018-07-23 DIAGNOSIS — J449 Chronic obstructive pulmonary disease, unspecified: Secondary | ICD-10-CM | POA: Insufficient documentation

## 2018-07-23 LAB — CBC
HCT: 38.9 % (ref 36.0–46.0)
Hemoglobin: 11.6 g/dL — ABNORMAL LOW (ref 12.0–15.0)
MCH: 30.9 pg (ref 26.0–34.0)
MCHC: 29.8 g/dL — AB (ref 30.0–36.0)
MCV: 103.7 fL — ABNORMAL HIGH (ref 80.0–100.0)
Platelets: 260 10*3/uL (ref 150–400)
RBC: 3.75 MIL/uL — ABNORMAL LOW (ref 3.87–5.11)
RDW: 15.8 % — ABNORMAL HIGH (ref 11.5–15.5)
WBC: 11.3 10*3/uL — ABNORMAL HIGH (ref 4.0–10.5)
nRBC: 0 % (ref 0.0–0.2)

## 2018-07-23 NOTE — Progress Notes (Signed)
Hgb 11.6 today. Aranesp injection held per parameters. Discussed with pt who verbalized understanding

## 2018-07-31 ENCOUNTER — Other Ambulatory Visit (HOSPITAL_COMMUNITY): Payer: Self-pay | Admitting: *Deleted

## 2018-07-31 DIAGNOSIS — C9000 Multiple myeloma not having achieved remission: Secondary | ICD-10-CM

## 2018-07-31 MED ORDER — POMALIDOMIDE 2 MG PO CAPS: 2.0000 mg | ORAL_CAPSULE | Freq: Every day | ORAL | 0 refills | Status: DC

## 2018-07-31 NOTE — Telephone Encounter (Signed)
Chart reviewed, pomalyst refilled.  

## 2018-08-02 ENCOUNTER — Other Ambulatory Visit (HOSPITAL_COMMUNITY): Payer: Self-pay

## 2018-08-02 DIAGNOSIS — D801 Nonfamilial hypogammaglobulinemia: Secondary | ICD-10-CM

## 2018-08-02 DIAGNOSIS — C9000 Multiple myeloma not having achieved remission: Secondary | ICD-10-CM

## 2018-08-05 ENCOUNTER — Other Ambulatory Visit (HOSPITAL_COMMUNITY): Payer: Medicare Other

## 2018-08-05 ENCOUNTER — Inpatient Hospital Stay (HOSPITAL_COMMUNITY): Payer: Medicare Other

## 2018-08-05 ENCOUNTER — Ambulatory Visit (HOSPITAL_COMMUNITY): Payer: Medicare Other

## 2018-08-05 ENCOUNTER — Telehealth (HOSPITAL_COMMUNITY): Payer: Self-pay

## 2018-08-05 VITALS — BP 120/60 | HR 60 | Resp 18

## 2018-08-05 DIAGNOSIS — E538 Deficiency of other specified B group vitamins: Secondary | ICD-10-CM

## 2018-08-05 DIAGNOSIS — D801 Nonfamilial hypogammaglobulinemia: Secondary | ICD-10-CM

## 2018-08-05 DIAGNOSIS — C9000 Multiple myeloma not having achieved remission: Secondary | ICD-10-CM

## 2018-08-05 LAB — CBC WITH DIFFERENTIAL/PLATELET
Abs Immature Granulocytes: 0.01 10*3/uL (ref 0.00–0.07)
Basophils Absolute: 0.1 10*3/uL (ref 0.0–0.1)
Basophils Relative: 2 %
Eosinophils Absolute: 0.2 10*3/uL (ref 0.0–0.5)
Eosinophils Relative: 3 %
HEMATOCRIT: 36.4 % (ref 36.0–46.0)
Hemoglobin: 11.3 g/dL — ABNORMAL LOW (ref 12.0–15.0)
Immature Granulocytes: 0 %
Lymphocytes Relative: 21 %
Lymphs Abs: 1.2 10*3/uL (ref 0.7–4.0)
MCH: 31.7 pg (ref 26.0–34.0)
MCHC: 31 g/dL (ref 30.0–36.0)
MCV: 102 fL — ABNORMAL HIGH (ref 80.0–100.0)
MONOS PCT: 22 %
Monocytes Absolute: 1.2 10*3/uL — ABNORMAL HIGH (ref 0.1–1.0)
Neutro Abs: 2.9 10*3/uL (ref 1.7–7.7)
Neutrophils Relative %: 52 %
Platelets: 214 10*3/uL (ref 150–400)
RBC: 3.57 MIL/uL — ABNORMAL LOW (ref 3.87–5.11)
RDW: 15.4 % (ref 11.5–15.5)
WBC: 5.5 10*3/uL (ref 4.0–10.5)
nRBC: 0 % (ref 0.0–0.2)

## 2018-08-05 LAB — COMPREHENSIVE METABOLIC PANEL
ALT: 19 U/L (ref 0–44)
AST: 19 U/L (ref 15–41)
Albumin: 3.5 g/dL (ref 3.5–5.0)
Alkaline Phosphatase: 71 U/L (ref 38–126)
Anion gap: 7 (ref 5–15)
BUN: 16 mg/dL (ref 8–23)
CO2: 27 mmol/L (ref 22–32)
Calcium: 8.6 mg/dL — ABNORMAL LOW (ref 8.9–10.3)
Chloride: 105 mmol/L (ref 98–111)
Creatinine, Ser: 1.02 mg/dL — ABNORMAL HIGH (ref 0.44–1.00)
GFR calc Af Amer: >60 mL/min (ref >60)
GFR calc non Af Amer: 53 mL/min — ABNORMAL LOW (ref >60)
Glucose, Bld: 96 mg/dL (ref 70–99)
Potassium: 4.1 mmol/L (ref 3.5–5.1)
Sodium: 139 mmol/L (ref 135–145)
Total Bilirubin: 0.5 mg/dL (ref 0.3–1.2)
Total Protein: 6.5 g/dL (ref 6.5–8.1)

## 2018-08-05 MED ORDER — CYANOCOBALAMIN 1000 MCG/ML IJ SOLN
INTRAMUSCULAR | Status: AC
  Filled 2018-08-05: qty 1

## 2018-08-05 MED ORDER — CYANOCOBALAMIN 1000 MCG/ML IJ SOLN
1000.0000 ug | Freq: Once | INTRAMUSCULAR | Status: AC
  Administered 2018-08-05: 1000 ug via INTRAMUSCULAR

## 2018-08-05 NOTE — Patient Instructions (Signed)
[place] at [facility]  Discharge Instructions:  You received a B12 injection today. _______________________________________________________________  Thank you for choosing [place] at [facility] to provide your oncology and hematology care.  To afford each patient quality time with our providers, please arrive at least 15 minutes before your scheduled appointment.  You need to re-schedule your appointment if you arrive 10 or more minutes late.  We strive to give you quality time with our providers, and arriving late affects you and other patients whose appointments are after yours.  Also, if you no show three or more times for appointments you may be dismissed from the clinic.  Again, thank you for choosing [place] at [facility] hope is that these requests will allow you access to exceptional care and in a timely manner. _______________________________________________________________  If you have questions after your visit, please contact our office at [phone] between the hours of 8:30 a.m. and 5:00 p.m. Voicemails left after 4:30 p.m. will not be returned until the following business day. _______________________________________________________________  For prescription refill requests, have your pharmacy contact our office. _______________________________________________________________  Recommendations made by the consultant and any test results will be sent to your referring physician. _______________________________________________________________

## 2018-08-05 NOTE — Progress Notes (Signed)
Carol Bradley presents today for B12 injection. Tolerated injection without incident or complaint. VSS. Discharged self ambulatory in satisfactory condition.

## 2018-08-05 NOTE — Telephone Encounter (Signed)
Called patient to let her know her hemoglobin was 11.3. She does not need her aranesp tomorrow per protocol.

## 2018-08-06 ENCOUNTER — Other Ambulatory Visit (HOSPITAL_COMMUNITY): Payer: Medicare Other

## 2018-08-06 ENCOUNTER — Ambulatory Visit (HOSPITAL_COMMUNITY): Payer: Medicare Other

## 2018-08-06 ENCOUNTER — Ambulatory Visit (HOSPITAL_COMMUNITY): Payer: Medicare Other | Admitting: Hematology

## 2018-08-06 ENCOUNTER — Encounter (HOSPITAL_COMMUNITY): Payer: Self-pay | Admitting: Hematology

## 2018-08-06 ENCOUNTER — Inpatient Hospital Stay (HOSPITAL_BASED_OUTPATIENT_CLINIC_OR_DEPARTMENT_OTHER): Payer: Medicare Other | Admitting: Hematology

## 2018-08-06 VITALS — BP 107/61 | HR 76 | Temp 97.7°F | Resp 20 | Wt 157.0 lb

## 2018-08-06 DIAGNOSIS — Z87891 Personal history of nicotine dependence: Secondary | ICD-10-CM

## 2018-08-06 DIAGNOSIS — H538 Other visual disturbances: Secondary | ICD-10-CM

## 2018-08-06 DIAGNOSIS — R5383 Other fatigue: Secondary | ICD-10-CM

## 2018-08-06 DIAGNOSIS — R600 Localized edema: Secondary | ICD-10-CM

## 2018-08-06 DIAGNOSIS — N189 Chronic kidney disease, unspecified: Secondary | ICD-10-CM

## 2018-08-06 DIAGNOSIS — Z7982 Long term (current) use of aspirin: Secondary | ICD-10-CM

## 2018-08-06 DIAGNOSIS — I2510 Atherosclerotic heart disease of native coronary artery without angina pectoris: Secondary | ICD-10-CM

## 2018-08-06 DIAGNOSIS — H269 Unspecified cataract: Secondary | ICD-10-CM

## 2018-08-06 DIAGNOSIS — E785 Hyperlipidemia, unspecified: Secondary | ICD-10-CM

## 2018-08-06 DIAGNOSIS — I129 Hypertensive chronic kidney disease with stage 1 through stage 4 chronic kidney disease, or unspecified chronic kidney disease: Secondary | ICD-10-CM

## 2018-08-06 DIAGNOSIS — Z79899 Other long term (current) drug therapy: Secondary | ICD-10-CM

## 2018-08-06 DIAGNOSIS — J449 Chronic obstructive pulmonary disease, unspecified: Secondary | ICD-10-CM

## 2018-08-06 DIAGNOSIS — D509 Iron deficiency anemia, unspecified: Secondary | ICD-10-CM

## 2018-08-06 DIAGNOSIS — Z9221 Personal history of antineoplastic chemotherapy: Secondary | ICD-10-CM

## 2018-08-06 DIAGNOSIS — G629 Polyneuropathy, unspecified: Secondary | ICD-10-CM

## 2018-08-06 DIAGNOSIS — C9000 Multiple myeloma not having achieved remission: Secondary | ICD-10-CM | POA: Diagnosis not present

## 2018-08-06 DIAGNOSIS — H9209 Otalgia, unspecified ear: Secondary | ICD-10-CM

## 2018-08-06 NOTE — Assessment & Plan Note (Addendum)
1. 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 gain, -13 -Status post Velcade and dexamethasone from 03/18/2015 through 07/28/2015, Velcade discontinued secondary to neuropathy - Revlimid and dexamethasone from February 2017 through November 2017, achieving negative SPEP and immunofixation -Seen by Dr. Norma Fredrickson for second opinion, bone marrow biopsy on 11/14/2016 shows less than 2% plasma cells, maintenance Velcade started in May 2018, held in January 2019 due to diarrhea -Noted to have biochemical relapse with M spike of 0.5, recommended to start on reduced dose pomalidomide, Ninlaro and dexamethasone -Started first cycle of Pomalyst 2 mg (days 1-21 every 28 days), ixazomib 2.3 mg (days 1, 8, 15 every 28 days), and Dexamethasone 10 mg (weekly) on 08/24/2017 due to biochemical relapse.  She has tolerated it reasonably well although she felt somewhat tired.  She felt severely tired after cycle 2.  Ninlaro was held during third cycle due to excessive tiredness.  Cycle 4 of pomalidomide and dexamethasone started on 11/26/2017.  She is tolerating it very well without any side effects.  Her M spike on 11/23/2017 has come down to 0.2 from 0.4 on 10/29/2017.  Light chain ratio is stable at 2.4.  Kappa light chains have come down from 83-46. - She was seen by Dr. Norma Fredrickson on 12/10/2017 and was recommended to cut back on dexamethasone 10 mg to every other week.  She also underwent cardiology evaluation for work-up of shortness of breath which were negative. - She is currently taking pomalidomide 2 mg 3 weeks on 1 week off.  Dexamethasone is being taken 10 mg every other week.  Ninlaro was on hold since last visit on 02/11/2018. - Her M spike from 03/08/2018 was 0.2.  Free light chain ratio was 2.02.  Kappa light chains were elevated at 47. -Ninlaro 2.3 mg  weekly was started back on 04/15/2018.  Pomalidomide dose is 2 mg, 3 weeks on 1 week off.  Dexamethasone is 10 mg every other week. -She was evaluated by Dr. Norma Fredrickson on 06/10/2018.  Myeloma blood work was stable. -We reviewed myeloma work-up from 07/09/2018.  SPEP shows 0.2 g/dL.  Free light chain ratio was 2.2.  Kappa light chains have improved to 35.2. -She complained of blurring of her vision lately.  I have reviewed Ninlaro which can cause blurring of vision and 6% of patients. -I have told her to follow-up with her eye doctor as she had recently undergone cataract surgeries.  She has a follow-up appointment to see Dr. Diona Foley at [facility] in April. -She has very mild swelling of ankles at the end of the day for the last 6 weeks.  We will keep a close eye on it.  If they get worse we will prescribe her Lasix. -She will come back in 2 months for follow-up with repeat blood work.  2.  Bone protection: -She is receiving Zometa 3 mg every 3 months.  Last dose was on 07/09/2018.  3.  Peripheral neuropathy: She has numbness in the feet which has been stable.  This is from prior Velcade exposure.  4.  Normocytic anemia: - This is from CKD and iron deficiency. -Received Feraheme on 04/15/2018 and 04/22/2018.  She is continue B12 injections monthly. -She is also receiving Aranesp 300 mcg every 4 weeks.  Today her hemoglobin improved 11.3.

## 2018-08-06 NOTE — Progress Notes (Signed)
Carol Bradley, Carol Bradley [id number]   CLINIC:  Medical Oncology/Hematology  PCP:  [name], MD [address] [place] [address] [phone]   REASON FOR VISIT: Follow-up for multiple myeloma  CURRENT THERAPY: pomalidomide 3 weeks on 1 week off and dexamethasone every there week, Ninlaro weekly.  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     03/18/2015 - 07/28/2015 Chemotherapy    Velcade 1.6 mg/m2 discontinued secondary to intolerance    07/28/2015 Adverse Reaction    stool incontinence, weakness, collapse upon standing, felt to be secondary to velcade    11/09/2015 - 12/13/2015 Chemotherapy    cytoxan IV 300 mg/m2 administered X 2 doses only in addition to rev/dex    11/09/2015 - 06/08/2016 Chemotherapy    Revlimid/Dexamethasone     02/05/2017 - 07/09/2017 Chemotherapy    The patient had bortezomib SQ (VELCADE) chemo injection 1.75 mg, 1 mg/m2 = 1.75 mg (66.7 % of original dose 1.5 mg/m2), Subcutaneous,  Once, 1 of 8 cycles Dose modification: 1.5 mg/m2 (original dose 1.5 mg/m2, Cycle 1, Reason: Provider Judgment, Comment: Per Dr. Norma Fredrickson recommendations from [facility] 1 mg/m2 (original dose 1.5 mg/m2, Cycle 1, Reason: Provider Judgment)  for chemotherapy treatment.      07/09/2017 Adverse Reaction    Diarrhea     Chemotherapy    Pomalyst 2 mg (Days 1-21 every 28 days), Ixazomib 2.3 mg (days 1, 8, 15 every 28 days), and Dexamethasone 10 mg (weekly)- Rx's printed on 08/24/2017.  Treatment recommendations from [facility]. [facility]rickson at [facility].      INTERVAL HISTORY:  Carol Bradley 78 y.o. female returns for routine follow-up for multiple myeloma. She is here today and doing great. She does report vision changes 1 year post cataract surgery. She will follow up  with there eye doctor. She does have mild fatigue. She is trying to remain active. She has lower leg edema that goes away at night. Denies any nausea, vomiting, or diarrhea. Denies any new pains. Had not noticed any recent bleeding such as epistaxis, hematuria or hematochezia. Denies recent chest pain on exertion, shortness of breath on minimal exertion, pre-syncopal episodes, or palpitations. Denies any numbness or tingling in hands or feet. Denies any recent fevers, infections, or recent hospitalizations. Patient reports appetite at 75% and energy level at 75%.   REVIEW OF SYSTEMS:  Review of Systems  Constitutional: Positive for fatigue.  HENT:         Ear pain and congestion  Eyes: Positive for eye problems (vision changes).  All other systems reviewed and are negative.    PAST MEDICAL/SURGICAL HISTORY:  Past Medical History:  Diagnosis Date  . Anemia associated with stage 3 chronic renal failure ([place]) 04/13/2016  . CAD (coronary artery disease)   . COPD (chronic obstructive pulmonary disease) ([place])   . History of tobacco abuse   . Hyperlipidemia   . Hypertension   . 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 Multiple myeloma ([place])   . Vitamin B12 deficiency 04/15/2016   Overview:  Vitamin B12 level documented 155, January 2016 with the normal range being 211-924   Past Surgical History:  Procedure Laterality Date  . BACK SURGERY    . CARDIAC CATHETERIZATION  04/2011   right and left cath showing normal right  heart pressures,but newly diagnosed coronary artery disease/drug eluting stent placed to RCA with residual disease in the proximal RCA and LAD and ramus, normal LV function and 60-65% EF  . COLONOSCOPY N/A 09/30/2014   Procedure: COLONOSCOPY;  Surgeon: Rogene Houston, MD;  Location: [facility];  Service: Endoscopy;  Laterality: N/A;  225  . CORONARY ANGIOPLASTY WITH STENT PLACEMENT  04/2011   mid RCA: 3.0 X38 mm Promus DES. Residual 40% disease proximally  . NECK SURGERY         SOCIAL HISTORY:  Social History   Socioeconomic History  . Marital status: Widowed    Spouse name: Not on file  . Number of children: Not on file  . Years of education: Not on file  . Highest education level: Not on file  Occupational History  . 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 Financial resource strain: Not on file  . Food insecurity:    Worry: Not on file    Inability: Not on file  . Transportation needs:    Medical: Not on file    Non-medical: Not on file  Tobacco Use  . Smoking status: Former Smoker    Packs/day: 3.00    Years: 25.00    Pack years: 75.00    Types: Cigarettes    Last attempt to quit: 07/10/1994    Years since quitting: 24.0  . Smokeless tobacco: Never Used  Substance and Sexual Activity  . Alcohol use: No  . Drug use: Not on file  . Sexual activity: Not on file  Lifestyle  . Physical activity:    Days per week: Not on file    Minutes per session: Not on file  . Stress: Not on file  Relationships  . Social connections:    Talks on phone: Not on file    Gets together: Not on file    Attends religious service: Not on file    Active member of club or organization: Not on file    Attends meetings of clubs or organizations: Not on file    Relationship status: Not on file  . Intimate partner violence:    Fear of current or ex partner: Not on file    Emotionally abused: Not on file    Physically abused: Not on file    Forced sexual activity: Not on file  Other Topics Concern  . Not on file  Social History Narrative  . Not on file    FAMILY HISTORY:  Family History  Problem Relation Age of Onset  . Heart failure Mother   . Cancer Father     CURRENT MEDICATIONS:  Outpatient Encounter Medications as of 08/06/2018  Medication Sig  . acyclovir (ZOVIRAX) 200 MG capsule Take 1 capsule (200 mg total) by mouth 2 (two) times daily.  Marland Kitchen aspirin EC 81 MG tablet Take 1 tablet (81 mg total) by mouth daily.  Marland Kitchen  atorvastatin (LIPITOR) 10 MG tablet TAKE ONE TABLET BY MOUTH DAILY  . Calcium Carbonate-Vit D-Min (CALCIUM 600+D PLUS MINERALS) 600-400 MG-UNIT CHEW Chew 1 tablet by mouth 3 (three) times daily.   . cefUROXime (CEFTIN) 250 MG tablet Take 250 mg by mouth 2 (two) times daily.  . cyanocobalamin (,VITAMIN B-12,) 1000 MCG/ML injection Inject 1,000 mcg as directed every 30 (thirty) days.  Marland Kitchen dexamethasone (DECADRON) 2 MG tablet Take 10 mg (5 tablets) by mouth weekly  . diazepam (VALIUM) 5 MG tablet Take 1 tablet (5 mg total) by  mouth every 6 (six) hours as needed for anxiety.  Marland Kitchen diltiazem (CARDIZEM CD) 240 MG 24 hr capsule TAKE ONE CAPSULE BY MOUTH DAILY  . guaiFENesin (MUCINEX) 600 MG 12 hr tablet Take 600 mg 2 (two) times daily as needed by mouth for cough or to loosen phlegm.  Marland Kitchen losartan (COZAAR) 50 MG tablet TAKE ONE TABLET BY MOUTH DAILY  . magnesium oxide (MAG-OX) 400 MG tablet TAKE ONE TABLET BY MOUTH TWICE DAILY. WHEN TAKING LASIX  . metoprolol tartrate (LOPRESSOR) 25 MG tablet TAKE ONE TABLET BY MOUTH TWICE DAILY  . ondansetron (ZOFRAN) 8 MG tablet Take 1 tablet (8 mg total) by mouth every 8 (eight) hours as needed for nausea or vomiting.  . pomalidomide (POMALYST) 2 MG capsule Take 1 capsule (2 mg total) by mouth daily. Take with water on days 1-21. Repeat every 28 days.  . potassium chloride (K-DUR,KLOR-CON) 10 MEQ tablet Take 1 tablet (10 mEq total) by mouth 3 (three) times daily. (Patient taking differently: Take 10 mEq by mouth once. )   No facility-administered encounter medications on file as of 08/06/2018.     ALLERGIES:  Allergies  Allergen Reactions  . Lisinopril Cough  . Plavix [Clopidogrel Bisulfate] Swelling     PHYSICAL EXAM:  ECOG Performance status: 1  Vitals:   08/06/18 1034  BP: 107/61  Pulse: 76  Resp: 20  Temp: 97.7 F (36.5 C)  SpO2: 97%   Filed Weights   08/06/18 1034  Weight: 157 lb (71.2 kg)    Physical Exam Constitutional:      Appearance: Normal  appearance. She is normal weight.  Cardiovascular:     Rate and Rhythm: Normal rate and regular rhythm.     Heart sounds: Normal heart sounds.  Pulmonary:     Effort: Pulmonary effort is normal.     Breath sounds: Normal breath sounds.  Musculoskeletal: Normal range of motion.  Skin:    General: Skin is warm and dry.  Neurological:     Mental Status: She is alert and oriented to person, place, and time. Mental status is at baseline.  Psychiatric:        Mood and Affect: Mood normal.        Behavior: Behavior normal.        Thought Content: Thought content normal.        Judgment: Judgment normal.      LABORATORY DATA:  I have reviewed the labs as listed.  CBC    Component Value Date/Time   WBC 5.5 08/05/2018 1216   RBC 3.57 (L) 08/05/2018 1216   HGB 11.3 (L) 08/05/2018 1216   HCT 36.4 08/05/2018 1216   PLT 214 08/05/2018 1216   MCV 102.0 (H) 08/05/2018 1216   MCH 31.7 08/05/2018 1216   MCHC 31.0 08/05/2018 1216   RDW 15.4 08/05/2018 1216   LYMPHSABS 1.2 08/05/2018 1216   MONOABS 1.2 (H) 08/05/2018 1216   EOSABS 0.2 08/05/2018 1216   BASOSABS 0.1 08/05/2018 1216   CMP Latest Ref Rng & Units 08/05/2018 07/09/2018 04/15/2018  Glucose 70 - 99 mg/dL 96 107(H) 100(H)  BUN 8 - 23 mg/dL 16 28(H) 23  Creatinine 0.44 - 1.00 mg/dL 1.02(H) 1.12(H) 1.21(H)  Sodium 135 - 145 mmol/L 139 140 140  Potassium 3.5 - 5.1 mmol/L 4.1 4.1 3.9  Chloride 98 - 111 mmol/L 105 108 107  CO2 22 - 32 mmol/L _0 Calcium 8.9 - 10.3 mg/dL 8.6(L) 9.0 8.8(L)  Total Protein 6.5 - 8.1  g/dL 6.5 6.5 6.6  Total Bilirubin 0.3 - 1.2 mg/dL 0.5 0.4 0.5  Alkaline Phos 38 - 126 U/L 71 61 72  AST 15 - 41 U/L _0 ALT 0 - 44 U/L _1 DIAGNOSTIC IMAGING:  I have independently reviewed the scans and discussed with the patient.   I have reviewed Francene Finders, NP's note and agree with the documentation.  I personally performed a face-to-face visit, made revisions and my assessment and  plan is as follows.    ASSESSMENT & PLAN:   Multiple myeloma ([place]) 1. 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 gain, -13 -Status post Velcade and dexamethasone from 03/18/2015 through 07/28/2015, Velcade discontinued secondary to neuropathy - Revlimid and dexamethasone from February 2017 through November 2017, achieving negative SPEP and immunofixation -Seen by Dr. Norma Fredrickson for second opinion, bone marrow biopsy on 11/14/2016 shows less than 2% plasma cells, maintenance Velcade started in May 2018, held in January 2019 due to diarrhea -Noted to have biochemical relapse with M spike of 0.5, recommended to start on reduced dose pomalidomide, Ninlaro and dexamethasone -Started first cycle of Pomalyst 2 mg (days 1-21 every 28 days), ixazomib 2.3 mg (days 1, 8, 15 every 28 days), and Dexamethasone 10 mg (weekly) on 08/24/2017 due to biochemical relapse.  She has tolerated it reasonably well although she felt somewhat tired.  She felt severely tired after cycle 2.  Ninlaro was held during third cycle due to excessive tiredness.  Cycle 4 of pomalidomide and dexamethasone started on 11/26/2017.  She is tolerating it very well without any side effects.  Her M spike on 11/23/2017 has come down to 0.2 from 0.4 on 10/29/2017.  Light chain ratio is stable at 2.4.  Kappa light chains have come down from 83-46. - She was seen by Dr. Norma Fredrickson on 12/10/2017 and was recommended to cut back on dexamethasone 10 mg to every other week.  She also underwent cardiology evaluation for work-up of shortness of breath which were negative. - She is currently taking pomalidomide 2 mg 3 weeks on 1 week off.  Dexamethasone is being taken 10 mg every other week.  Ninlaro was on hold since last visit on 02/11/2018. - Her M spike from 03/08/2018 was 0.2.  Free light chain  ratio was 2.02.  Kappa light chains were elevated at 47. -Ninlaro 2.3 mg weekly was started back on 04/15/2018.  Pomalidomide dose is 2 mg, 3 weeks on 1 week off.  Dexamethasone is 10 mg every other week. -She was evaluated by Dr. Norma Fredrickson on 06/10/2018.  Myeloma blood work was stable. -We reviewed myeloma work-up from 07/09/2018.  SPEP shows 0.2 g/dL.  Free light chain ratio was 2.2.  Kappa light chains have improved to 35.2. -She complained of blurring of her vision lately.  I have reviewed Ninlaro which can cause blurring of vision and 6% of patients. -I have told her to follow-up with her eye doctor as she had recently undergone cataract surgeries.  She has a follow-up appointment to see Dr. Diona Foley at [facility] in April. -She has very mild swelling of ankles at the end of the day for the last 6 weeks.  We will keep a close eye on it.  If they get worse we will prescribe her Lasix. -She  will come back in 2 months for follow-up with repeat blood work.  2.  Bone protection: -She is receiving Zometa 3 mg every 3 months.  Last dose was on 07/09/2018.  3.  Peripheral neuropathy: She has numbness in the feet which has been stable.  This is from prior Velcade exposure.  4.  Normocytic anemia: - This is from CKD and iron deficiency. -Received Feraheme on 04/15/2018 and 04/22/2018.  She is continue B12 injections monthly. -She is also receiving Aranesp 300 mcg every 4 weeks.  Today her hemoglobin improved 11.3.        Orders placed this encounter:  Orders Placed This Encounter  Procedures  . Lactate dehydrogenase  . Protein electrophoresis, serum  . Kappa/lambda light chains  . Magnesium  . CBC with Differential/Platelet  . Comprehensive metabolic panel  . Ferritin  . Iron and TIBC  . Vitamin B12  . Folate      Derek Jack, MD [place] [phone]

## 2018-08-06 NOTE — Patient Instructions (Signed)
[facility] at [facility] Discharge Instructions     Thank you for choosing [facility] at [facility] to provide your oncology and hematology care.  To afford each patient quality time with our provider, please arrive at least 15 minutes before your scheduled appointment time.   If you have a lab appointment with the Cancer Center please come in thru the  Main Entrance and check in at the main information desk  You need to re-schedule your appointment should you arrive 10 or more minutes late.  We strive to give you quality time with our providers, and arriving late affects you and other patients whose appointments are after yours.  Also, if you no show three or more times for appointments you may be dismissed from the clinic at the providers discretion.     Again, thank you for choosing [facility].  Our hope is that these requests will decrease the amount of time that you wait before being seen by our physicians.       _____________________________________________________________  Should you have questions after your visit to [facility], please contact our office at [phone] between the hours of 8:00 a.m. and 4:30 p.m.  Voicemails left after 4:00 p.m. will not be returned until the following business day.  For prescription refill requests, have your pharmacy contact our office and allow 72 hours.    Cancer Center Support Programs:   > Cancer Support Group  2nd Tuesday of the month 1pm-2pm, Journey Room    

## 2018-08-20 ENCOUNTER — Inpatient Hospital Stay (HOSPITAL_COMMUNITY): Payer: Medicare Other

## 2018-08-20 ENCOUNTER — Inpatient Hospital Stay (HOSPITAL_COMMUNITY): Payer: Medicare Other | Attending: Hematology

## 2018-08-20 ENCOUNTER — Other Ambulatory Visit: Payer: Self-pay

## 2018-08-20 ENCOUNTER — Encounter (HOSPITAL_COMMUNITY): Payer: Self-pay

## 2018-08-20 VITALS — BP 123/54 | HR 84 | Temp 98.8°F | Resp 18

## 2018-08-20 DIAGNOSIS — D801 Nonfamilial hypogammaglobulinemia: Secondary | ICD-10-CM

## 2018-08-20 DIAGNOSIS — D631 Anemia in chronic kidney disease: Secondary | ICD-10-CM

## 2018-08-20 DIAGNOSIS — C9000 Multiple myeloma not having achieved remission: Secondary | ICD-10-CM

## 2018-08-20 DIAGNOSIS — D509 Iron deficiency anemia, unspecified: Secondary | ICD-10-CM

## 2018-08-20 DIAGNOSIS — N183 Chronic kidney disease, stage 3 unspecified: Secondary | ICD-10-CM

## 2018-08-20 DIAGNOSIS — E538 Deficiency of other specified B group vitamins: Secondary | ICD-10-CM

## 2018-08-20 LAB — CBC
HCT: 33.4 % — ABNORMAL LOW (ref 36.0–46.0)
Hemoglobin: 10.2 g/dL — ABNORMAL LOW (ref 12.0–15.0)
MCH: 31.2 pg (ref 26.0–34.0)
MCHC: 30.5 g/dL (ref 30.0–36.0)
MCV: 102.1 fL — ABNORMAL HIGH (ref 80.0–100.0)
Platelets: 207 10*3/uL (ref 150–400)
RBC: 3.27 MIL/uL — ABNORMAL LOW (ref 3.87–5.11)
RDW: 15 % (ref 11.5–15.5)
WBC: 8.6 10*3/uL (ref 4.0–10.5)
nRBC: 0 % (ref 0.0–0.2)

## 2018-08-20 MED ORDER — DARBEPOETIN ALFA 300 MCG/0.6ML IJ SOSY
PREFILLED_SYRINGE | INTRAMUSCULAR | Status: AC
  Filled 2018-08-20: qty 0.6

## 2018-08-20 MED ORDER — DARBEPOETIN ALFA 300 MCG/0.6ML IJ SOSY
300.0000 ug | PREFILLED_SYRINGE | Freq: Once | INTRAMUSCULAR | Status: AC
  Administered 2018-08-20: 300 ug via SUBCUTANEOUS

## 2018-08-20 NOTE — Patient Instructions (Signed)
[facility] at [facility] Discharge Instructions  Received Aranesp injection today. Follow-up as scheduled. Call clinic for any questions or concerns   Thank you for choosing [facility] at [facility] to provide your oncology and hematology care.  To afford each patient quality time with our provider, please arrive at least 15 minutes before your scheduled appointment time.   If you have a lab appointment with the Cancer Center please come in thru the  Main Entrance and check in at the main information desk  You need to re-schedule your appointment should you arrive 10 or more minutes late.  We strive to give you quality time with our providers, and arriving late affects you and other patients whose appointments are after yours.  Also, if you no show three or more times for appointments you may be dismissed from the clinic at the providers discretion.     Again, thank you for choosing [facility].  Our hope is that these requests will decrease the amount of time that you wait before being seen by our physicians.       _____________________________________________________________  Should you have questions after your visit to  Cancer Center, please contact our office at [phone] between the hours of 8:00 a.m. and 4:30 p.m.  Voicemails left after 4:00 p.m. will not be returned until the following business day.  For prescription refill requests, have your pharmacy contact our office and allow 72 hours.    Cancer Center Support Programs:   > Cancer Support Group  2nd Tuesday of the month 1pm-2pm, Journey Room   

## 2018-08-20 NOTE — Progress Notes (Signed)
Carol Bradley tolerated Aranesp injection well without complaints or incident. Hgb 10.2 today VSS Pt discharged self ambulatory in satisfactory condition

## 2018-08-21 ENCOUNTER — Encounter: Payer: Self-pay | Admitting: Cardiology

## 2018-08-22 ENCOUNTER — Telehealth (HOSPITAL_COMMUNITY): Payer: Self-pay | Admitting: *Deleted

## 2018-08-22 MED ORDER — FUROSEMIDE 20 MG PO TABS: 20.0000 mg | ORAL_TABLET | Freq: Every day | ORAL | 0 refills | Status: DC | PRN

## 2018-08-22 NOTE — Telephone Encounter (Signed)
Pt contacted clinic c/o worsening of bilateral ankle swelling. This swelling was noted on her last office visit, and she was instructed to call the clinic if the swelling became worse, and we would provide her an Rx for Lasix. Dr. Delton Coombes notified and order rec'd to e-scribe Lasix 20 mg daily prn swelling. Pt notfied of Rx sent to [facility] in [place].

## 2018-08-27 ENCOUNTER — Other Ambulatory Visit (HOSPITAL_COMMUNITY): Payer: Self-pay | Admitting: *Deleted

## 2018-08-27 DIAGNOSIS — C9000 Multiple myeloma not having achieved remission: Secondary | ICD-10-CM

## 2018-08-27 MED ORDER — POMALIDOMIDE 2 MG PO CAPS: 2.0000 mg | ORAL_CAPSULE | Freq: Every day | ORAL | 0 refills | Status: DC

## 2018-08-27 NOTE — Telephone Encounter (Signed)
Chart reviewed, pomalyst refilled.  

## 2018-09-02 ENCOUNTER — Inpatient Hospital Stay (HOSPITAL_COMMUNITY): Payer: Medicare Other

## 2018-09-02 ENCOUNTER — Other Ambulatory Visit (HOSPITAL_COMMUNITY): Payer: Self-pay | Admitting: Otolaryngology

## 2018-09-02 ENCOUNTER — Other Ambulatory Visit: Payer: Self-pay

## 2018-09-02 ENCOUNTER — Other Ambulatory Visit: Payer: Self-pay | Admitting: Otolaryngology

## 2018-09-02 ENCOUNTER — Ambulatory Visit (INDEPENDENT_AMBULATORY_CARE_PROVIDER_SITE_OTHER): Payer: Medicare Other | Admitting: Otolaryngology

## 2018-09-02 VITALS — BP 123/51 | HR 80 | Temp 97.7°F | Resp 20

## 2018-09-02 DIAGNOSIS — H6983 Other specified disorders of Eustachian tube, bilateral: Secondary | ICD-10-CM

## 2018-09-02 DIAGNOSIS — H903 Sensorineural hearing loss, bilateral: Secondary | ICD-10-CM

## 2018-09-02 DIAGNOSIS — N183 Chronic kidney disease, stage 3 (moderate): Secondary | ICD-10-CM | POA: Diagnosis not present

## 2018-09-02 DIAGNOSIS — H9071 Mixed conductive and sensorineural hearing loss, unilateral, right ear, with unrestricted hearing on the contralateral side: Secondary | ICD-10-CM | POA: Diagnosis not present

## 2018-09-02 DIAGNOSIS — E538 Deficiency of other specified B group vitamins: Secondary | ICD-10-CM

## 2018-09-02 DIAGNOSIS — J342 Deviated nasal septum: Secondary | ICD-10-CM

## 2018-09-02 DIAGNOSIS — J343 Hypertrophy of nasal turbinates: Secondary | ICD-10-CM | POA: Diagnosis not present

## 2018-09-02 DIAGNOSIS — J320 Chronic maxillary sinusitis: Secondary | ICD-10-CM

## 2018-09-02 MED ORDER — CYANOCOBALAMIN 1000 MCG/ML IJ SOLN
INTRAMUSCULAR | Status: AC
  Filled 2018-09-02: qty 1

## 2018-09-02 MED ORDER — CYANOCOBALAMIN 1000 MCG/ML IJ SOLN
1000.0000 ug | Freq: Once | INTRAMUSCULAR | Status: AC
  Administered 2018-09-02: 1000 ug via INTRAMUSCULAR

## 2018-09-02 NOTE — Progress Notes (Unsigned)
Pt here today for B12 injection. Pt given injection in Left Deltoid. Pt tolerated injection well with no complaints. Pt stable and discharged home ambulatory. Pt to continue B12 injections as scheduled.

## 2018-09-02 NOTE — Progress Notes (Signed)
Carol Bradley Date of Birth: 1940-10-26 Medical Record #[id number]  History of Present Illness: Carol Bradley is seen for follow up CAD.   She has a history of COPD and mild pulmonary hypertension. She also is a history of coronary disease and is status post stenting of the mid to distal RCA in October 2012. Her presenting symptoms were of dyspnea and not chest pain. She also has labile hypertension. She developed a cough on ACE inhibitors. She developed hyponatremia on HCTZ.   She is being treated for myeloma with maintenance chemo. She has required periodic blood transfusions and iron infusions for anemia. Her last visit in June 2019 she complained of dyspnea. Echo and Myoview studies were unremarkable.   On follow up today she reports she is doing well. She has had a couple of iron transfusions in the last 3 months and is on Aranesp. She still has dyspnea. No chest pain. Some intermittent swelling in her ankles. No fever.    Current Outpatient Medications on File Prior to Visit  Medication Sig Dispense Refill  . acyclovir (ZOVIRAX) 200 MG capsule Take 1 capsule (200 mg total) by mouth 2 (two) times daily. 180 capsule 1  . aspirin EC 81 MG tablet Take 1 tablet (81 mg total) by mouth daily. 90 tablet 3  . atorvastatin (LIPITOR) 10 MG tablet TAKE ONE TABLET BY MOUTH DAILY 90 tablet 2  . Calcium Carbonate-Vit D-Min (CALCIUM 600+D PLUS MINERALS) 600-400 MG-UNIT CHEW Chew 1 tablet by mouth 3 (three) times daily.     . cefUROXime (CEFTIN) 500 MG tablet     . cyanocobalamin (,VITAMIN B-12,) 1000 MCG/ML injection Inject 1,000 mcg as directed every 30 (thirty) days.    Marland Kitchen dexamethasone (DECADRON) 2 MG tablet Take 10 mg (5 tablets) by mouth weekly 20 tablet 3  . diazepam (VALIUM) 5 MG tablet Take 1 tablet (5 mg total) by mouth every 6 (six) hours as needed for anxiety. 30 tablet 1  . diltiazem (CARDIZEM CD) 240 MG 24 hr capsule TAKE ONE CAPSULE BY MOUTH DAILY 30 capsule 11  . furosemide (LASIX) 20 MG  tablet Take 1 tablet (20 mg total) by mouth daily as needed (as needed for ankle swelling). 20 tablet 0  . guaiFENesin (MUCINEX) 600 MG 12 hr tablet Take 600 mg 2 (two) times daily as needed by mouth for cough or to loosen phlegm.    Marland Kitchen losartan (COZAAR) 50 MG tablet TAKE ONE TABLET BY MOUTH DAILY 90 tablet 3  . magnesium oxide (MAG-OX) 400 MG tablet TAKE ONE TABLET BY MOUTH TWICE DAILY. WHEN TAKING LASIX  1  . metoprolol tartrate (LOPRESSOR) 25 MG tablet TAKE ONE TABLET BY MOUTH TWICE DAILY 180 tablet 3  . ondansetron (ZOFRAN) 8 MG tablet Take 1 tablet (8 mg total) by mouth every 8 (eight) hours as needed for nausea or vomiting. 30 tablet 2  . pomalidomide (POMALYST) 2 MG capsule Take 1 capsule (2 mg total) by mouth daily. Take with water on days 1-21. Repeat every 28 days. 21 capsule 0  . potassium chloride (K-DUR,KLOR-CON) 10 MEQ tablet Take 1 tablet (10 mEq total) by mouth 3 (three) times daily. (Patient taking differently: Take 10 mEq by mouth once. ) 90 tablet 4   No current facility-administered medications on file prior to visit.     Allergies  Allergen Reactions  . Lisinopril Cough  . Plavix [Clopidogrel Bisulfate] Swelling    Past Medical History:  Diagnosis Date  . Anemia associated with stage 3  chronic renal failure ([place]) 04/13/2016  . CAD (coronary artery disease)   . COPD (chronic obstructive pulmonary disease) ([place])   . History of tobacco abuse   . Hyperlipidemia   . Hypertension   . 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 Multiple myeloma ([place])   . Vitamin B12 deficiency 04/15/2016   Overview:  Vitamin B12 level documented 155, January 2016 with the normal range being 211-924    Past Surgical History:  Procedure Laterality Date  . BACK SURGERY    . CARDIAC CATHETERIZATION  04/2011   right and left cath showing normal right heart pressures,but newly diagnosed coronary artery disease/drug eluting stent placed to RCA with residual disease in the proximal RCA and LAD and  ramus, normal LV function and 60-65% EF  . COLONOSCOPY N/A 09/30/2014   Procedure: COLONOSCOPY;  Surgeon: Rogene Houston, MD;  Location: [facility];  Service: Endoscopy;  Laterality: N/A;  225  . CORONARY ANGIOPLASTY WITH STENT PLACEMENT  04/2011   mid RCA: 3.0 X38 mm Promus DES. Residual 40% disease proximally  . NECK SURGERY      Social History   Tobacco Use  Smoking Status Former Smoker  . Packs/day: 3.00  . Years: 25.00  . Pack years: 75.00  . Types: Cigarettes  . Last attempt to quit: 07/10/1994  . Years since quitting: 24.1  Smokeless Tobacco Never Used    Social History   Substance and Sexual Activity  Alcohol Use No    Family History  Problem Relation Age of Onset  . Heart failure Mother   . Cancer Father     Review of Systems: As noted in HPI.  All other systems were reviewed and are negative.  Physical Exam: BP 118/68   Pulse 72   Ht 5' 6" (1.676 m)   Wt 163 lb 9.6 oz (74.2 kg)   BMI 26.41 kg/m   GENERAL:  Well appearing WF in NAD HEENT:  PERRL, EOMI, sclera are clear. Oropharynx is clear. NECK:  No jugular venous distention, carotid upstroke brisk and symmetric, no bruits, no thyromegaly or adenopathy LUNGS:  Clear to auscultation bilaterally CHEST:  Unremarkable HEART:  RRR,  PMI not displaced or sustained,S1 and S2 within normal limits, no S3, no S4: no clicks, no rubs, no murmurs ABD:  Soft, nontender. BS +, no masses or bruits. No hepatomegaly, no splenomegaly EXT:  2 + pulses throughout, no edema, no cyanosis no clubbing SKIN:  Warm and dry.  No rashes NEURO:  Alert and oriented x 3. Cranial nerves II through XII intact. PSYCH:  Cognitively intact        LABORATORY DATA: Lab Results  Component Value Date   WBC 8.6 08/20/2018   HGB 10.2 (L) 08/20/2018   HCT 33.4 (L) 08/20/2018   PLT 207 08/20/2018   GLUCOSE 96 08/05/2018   CHOL 133 12/24/2017   TRIG 74 12/24/2017   HDL 45 12/24/2017   LDLCALC 73 12/24/2017   ALT 19 08/05/2018    AST 19 08/05/2018   NA 139 08/05/2018   K 4.1 08/05/2018   CL 105 08/05/2018   CREATININE 1.02 (H) 08/05/2018   BUN 16 08/05/2018   CO2 27 08/05/2018   INR 1.04 05/05/2011   Ecg done today shows NSR with old septal infarct. No change from prior. I have personally reviewed and interpreted this study.   Echo: 6/20/19Study Conclusions  - Left ventricle: The cavity size was normal. Wall thickness was   increased in a pattern of moderate LVH.  Systolic function was   normal. The estimated ejection fraction was in the range of 60%   to 65%. Wall motion was normal; there were no regional wall   motion abnormalities. Doppler parameters are consistent with   abnormal left ventricular relaxation (grade 1 diastolic   dysfunction). - Aortic valve: Trileaflet; mildly thickened, mildly calcified   leaflets. There was mild regurgitation. - Mitral valve: Calcified annulus. There was mild regurgitation.   Valve area by pressure half-time: 1.62 cm^2. - Tricuspid valve: There was mild regurgitation. - Pulmonary arteries: Systolic pressure was mildly increased. PA   peak pressure: 41 mm Hg (S).   Myoview 01/01/18:  Study Highlights     Nuclear stress EF: 72%. No wall motion abnormalities  No evidence of significant perfusion defects. No TID (transient ischemic dilatation). No infarct or ischemia identified. During Lexiscan infusion, diffuse mild, 1 mm ST segment depression noted.  This is a low risk study. There is not appear to be any ischemia identified. No ischemia in the previously placed RCA stent territory.   Candee Furbish, MD     Assessment / Plan: 1. Coronary disease status post stenting of the RCA in October of 2012 with a drug-eluting stent.   Normal  stress Myoview study in June 2019. No angina.  2. Hypercholesterolemia. Continue Lipitor therapy. LDL is well controlled.   3. COPD with remote history of tobacco use.  4. Hypertension-  well controlled.  5. CKD stage 3.  Creatinine improved to 1.02.   6. Multiple myeloma- per oncology. On decadron and Pomalyst.   7. 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  8. Edema. None on exam today. Based on prior cardiac evaluation I don't think this is CHF. Most likely related to decadron. OK to use lasix on a prn basis.

## 2018-09-03 ENCOUNTER — Other Ambulatory Visit (HOSPITAL_COMMUNITY): Payer: Medicare Other

## 2018-09-03 ENCOUNTER — Ambulatory Visit (HOSPITAL_COMMUNITY): Payer: Medicare Other

## 2018-09-04 ENCOUNTER — Other Ambulatory Visit (HOSPITAL_COMMUNITY): Payer: Medicare Other

## 2018-09-04 ENCOUNTER — Ambulatory Visit (HOSPITAL_COMMUNITY): Payer: Medicare Other

## 2018-09-05 ENCOUNTER — Ambulatory Visit (INDEPENDENT_AMBULATORY_CARE_PROVIDER_SITE_OTHER): Payer: Medicare Other | Admitting: Cardiology

## 2018-09-05 ENCOUNTER — Encounter: Payer: Self-pay | Admitting: Cardiology

## 2018-09-05 VITALS — BP 118/68 | HR 72 | Ht 66.0 in | Wt 163.6 lb

## 2018-09-05 DIAGNOSIS — I25119 Atherosclerotic heart disease of native coronary artery with unspecified angina pectoris: Secondary | ICD-10-CM | POA: Diagnosis not present

## 2018-09-05 DIAGNOSIS — E785 Hyperlipidemia, unspecified: Secondary | ICD-10-CM | POA: Diagnosis not present

## 2018-09-05 DIAGNOSIS — I10 Essential (primary) hypertension: Secondary | ICD-10-CM

## 2018-09-05 DIAGNOSIS — R0609 Other forms of dyspnea: Secondary | ICD-10-CM

## 2018-09-13 ENCOUNTER — Other Ambulatory Visit (HOSPITAL_COMMUNITY): Payer: Self-pay | Admitting: *Deleted

## 2018-09-13 ENCOUNTER — Ambulatory Visit (HOSPITAL_COMMUNITY)
Admission: RE | Admit: 2018-09-13 | Discharge: 2018-09-13 | Disposition: A | Discharge status: Home / Self Care | Payer: Medicare Other | Source: Ambulatory Visit | Attending: Otolaryngology | Admitting: Otolaryngology

## 2018-09-13 DIAGNOSIS — J320 Chronic maxillary sinusitis: Secondary | ICD-10-CM | POA: Insufficient documentation

## 2018-09-13 DIAGNOSIS — C9001 Multiple myeloma in remission: Secondary | ICD-10-CM

## 2018-09-13 MED ORDER — ACYCLOVIR 200 MG PO CAPS: 200.0000 mg | ORAL_CAPSULE | Freq: Two times a day (BID) | ORAL | 1 refills | Status: DC

## 2018-09-17 ENCOUNTER — Inpatient Hospital Stay (HOSPITAL_COMMUNITY): Payer: Medicare Other

## 2018-09-17 ENCOUNTER — Inpatient Hospital Stay (HOSPITAL_COMMUNITY): Payer: Medicare Other | Attending: Hematology

## 2018-09-17 ENCOUNTER — Encounter (HOSPITAL_COMMUNITY): Payer: Self-pay

## 2018-09-17 ENCOUNTER — Ambulatory Visit (HOSPITAL_COMMUNITY): Payer: Medicare Other | Admitting: Hematology

## 2018-09-17 VITALS — BP 124/45 | HR 84 | Temp 98.1°F | Resp 18 | Wt 160.0 lb

## 2018-09-17 DIAGNOSIS — E785 Hyperlipidemia, unspecified: Secondary | ICD-10-CM | POA: Diagnosis not present

## 2018-09-17 DIAGNOSIS — N183 Chronic kidney disease, stage 3 unspecified: Secondary | ICD-10-CM

## 2018-09-17 DIAGNOSIS — Z888 Allergy status to other drugs, medicaments and biological substances status: Secondary | ICD-10-CM | POA: Diagnosis not present

## 2018-09-17 DIAGNOSIS — R42 Dizziness and giddiness: Secondary | ICD-10-CM | POA: Insufficient documentation

## 2018-09-17 DIAGNOSIS — D631 Anemia in chronic kidney disease: Secondary | ICD-10-CM | POA: Insufficient documentation

## 2018-09-17 DIAGNOSIS — R5383 Other fatigue: Secondary | ICD-10-CM | POA: Insufficient documentation

## 2018-09-17 DIAGNOSIS — N189 Chronic kidney disease, unspecified: Secondary | ICD-10-CM | POA: Diagnosis not present

## 2018-09-17 DIAGNOSIS — D801 Nonfamilial hypogammaglobulinemia: Secondary | ICD-10-CM

## 2018-09-17 DIAGNOSIS — Z79899 Other long term (current) drug therapy: Secondary | ICD-10-CM | POA: Insufficient documentation

## 2018-09-17 DIAGNOSIS — D509 Iron deficiency anemia, unspecified: Secondary | ICD-10-CM

## 2018-09-17 DIAGNOSIS — J449 Chronic obstructive pulmonary disease, unspecified: Secondary | ICD-10-CM | POA: Diagnosis not present

## 2018-09-17 DIAGNOSIS — I2510 Atherosclerotic heart disease of native coronary artery without angina pectoris: Secondary | ICD-10-CM | POA: Insufficient documentation

## 2018-09-17 DIAGNOSIS — C9000 Multiple myeloma not having achieved remission: Secondary | ICD-10-CM | POA: Diagnosis not present

## 2018-09-17 DIAGNOSIS — Z87891 Personal history of nicotine dependence: Secondary | ICD-10-CM | POA: Diagnosis not present

## 2018-09-17 DIAGNOSIS — T451X5S Adverse effect of antineoplastic and immunosuppressive drugs, sequela: Secondary | ICD-10-CM | POA: Insufficient documentation

## 2018-09-17 DIAGNOSIS — Z9221 Personal history of antineoplastic chemotherapy: Secondary | ICD-10-CM | POA: Diagnosis not present

## 2018-09-17 DIAGNOSIS — E538 Deficiency of other specified B group vitamins: Secondary | ICD-10-CM

## 2018-09-17 DIAGNOSIS — I129 Hypertensive chronic kidney disease with stage 1 through stage 4 chronic kidney disease, or unspecified chronic kidney disease: Secondary | ICD-10-CM | POA: Diagnosis not present

## 2018-09-17 DIAGNOSIS — G620 Drug-induced polyneuropathy: Secondary | ICD-10-CM | POA: Insufficient documentation

## 2018-09-17 LAB — CBC
HEMATOCRIT: 33.1 % — AB (ref 36.0–46.0)
Hemoglobin: 10 g/dL — ABNORMAL LOW (ref 12.0–15.0)
MCH: 31 pg (ref 26.0–34.0)
MCHC: 30.2 g/dL (ref 30.0–36.0)
MCV: 102.5 fL — AB (ref 80.0–100.0)
Platelets: 321 10*3/uL (ref 150–400)
RBC: 3.23 MIL/uL — ABNORMAL LOW (ref 3.87–5.11)
RDW: 15.4 % (ref 11.5–15.5)
WBC: 13 10*3/uL — ABNORMAL HIGH (ref 4.0–10.5)
nRBC: 0 % (ref 0.0–0.2)

## 2018-09-17 MED ORDER — DARBEPOETIN ALFA 300 MCG/0.6ML IJ SOSY
300.0000 ug | PREFILLED_SYRINGE | Freq: Once | INTRAMUSCULAR | Status: AC
  Administered 2018-09-17: 300 ug via SUBCUTANEOUS
  Filled 2018-09-17: qty 0.6

## 2018-09-17 NOTE — Progress Notes (Signed)
Patient tolerated injection with no complaints voiced.  Site clean and dry with no bruising or swelling noted at site.  Band aid applied.  Vss with discharge and left ambulatory with no s/s of distress noted.  

## 2018-09-17 NOTE — Patient Instructions (Signed)
[facility] at [facility]  Discharge Instructions:   _______________________________________________________________  Thank you for choosing [facility] at [facility] to provide your oncology and hematology care.  To afford each patient quality time with our providers, please arrive at least 15 minutes before your scheduled appointment.  You need to re-schedule your appointment if you arrive 10 or more minutes late.  We strive to give you quality time with our providers, and arriving late affects you and other patients whose appointments are after yours.  Also, if you no show three or more times for appointments you may be dismissed from the clinic.  Again, thank you for choosing [facility] at [facility]. Our hope is that these requests will allow you access to exceptional care and in a timely manner. _______________________________________________________________  If you have questions after your visit, please contact our office at [phone] between the hours of 8:30 a.m. and 5:00 p.m. Voicemails left after 4:30 p.m. will not be returned until the following business day. _______________________________________________________________  For prescription refill requests, have your pharmacy contact our office. _______________________________________________________________  Recommendations made by the consultant and any test results will be sent to your referring physician. _______________________________________________________________ 

## 2018-09-23 ENCOUNTER — Ambulatory Visit (HOSPITAL_COMMUNITY): Payer: Medicare Other

## 2018-09-25 ENCOUNTER — Other Ambulatory Visit (HOSPITAL_COMMUNITY): Payer: Self-pay | Admitting: *Deleted

## 2018-09-25 DIAGNOSIS — C9000 Multiple myeloma not having achieved remission: Secondary | ICD-10-CM

## 2018-09-25 MED ORDER — POMALIDOMIDE 2 MG PO CAPS: 2.0000 mg | ORAL_CAPSULE | Freq: Every day | ORAL | 0 refills | Status: DC

## 2018-09-25 NOTE — Telephone Encounter (Signed)
Chart reviewed, pomalyst refilled.  

## 2018-09-30 ENCOUNTER — Other Ambulatory Visit (HOSPITAL_COMMUNITY): Payer: Medicare Other

## 2018-09-30 ENCOUNTER — Inpatient Hospital Stay (HOSPITAL_COMMUNITY): Payer: Medicare Other

## 2018-09-30 ENCOUNTER — Other Ambulatory Visit: Payer: Self-pay

## 2018-09-30 ENCOUNTER — Encounter (HOSPITAL_COMMUNITY): Payer: Self-pay

## 2018-09-30 ENCOUNTER — Ambulatory Visit (INDEPENDENT_AMBULATORY_CARE_PROVIDER_SITE_OTHER): Payer: Medicare Other | Admitting: Otolaryngology

## 2018-09-30 ENCOUNTER — Other Ambulatory Visit (HOSPITAL_COMMUNITY): Payer: Self-pay | Admitting: *Deleted

## 2018-09-30 VITALS — BP 121/62 | HR 110 | Temp 98.2°F | Resp 18

## 2018-09-30 DIAGNOSIS — C9000 Multiple myeloma not having achieved remission: Secondary | ICD-10-CM | POA: Diagnosis not present

## 2018-09-30 DIAGNOSIS — J342 Deviated nasal septum: Secondary | ICD-10-CM

## 2018-09-30 DIAGNOSIS — J343 Hypertrophy of nasal turbinates: Secondary | ICD-10-CM | POA: Diagnosis not present

## 2018-09-30 DIAGNOSIS — J310 Chronic rhinitis: Secondary | ICD-10-CM

## 2018-09-30 DIAGNOSIS — H6983 Other specified disorders of Eustachian tube, bilateral: Secondary | ICD-10-CM

## 2018-09-30 DIAGNOSIS — E538 Deficiency of other specified B group vitamins: Secondary | ICD-10-CM

## 2018-09-30 MED ORDER — CYANOCOBALAMIN 1000 MCG/ML IJ SOLN
1000.0000 ug | Freq: Once | INTRAMUSCULAR | Status: AC
  Administered 2018-09-30: 1000 ug via INTRAMUSCULAR
  Filled 2018-09-30: qty 1

## 2018-10-01 ENCOUNTER — Ambulatory Visit (HOSPITAL_COMMUNITY): Payer: Medicare Other

## 2018-10-01 ENCOUNTER — Other Ambulatory Visit (HOSPITAL_COMMUNITY): Payer: Medicare Other

## 2018-10-01 ENCOUNTER — Other Ambulatory Visit (HOSPITAL_COMMUNITY): Payer: Self-pay | Admitting: *Deleted

## 2018-10-01 ENCOUNTER — Ambulatory Visit (HOSPITAL_COMMUNITY): Payer: Medicare Other | Admitting: Hematology

## 2018-10-02 ENCOUNTER — Other Ambulatory Visit (HOSPITAL_COMMUNITY): Payer: Medicare Other

## 2018-10-02 ENCOUNTER — Inpatient Hospital Stay (HOSPITAL_BASED_OUTPATIENT_CLINIC_OR_DEPARTMENT_OTHER): Payer: Medicare Other | Admitting: Hematology

## 2018-10-02 ENCOUNTER — Inpatient Hospital Stay (HOSPITAL_COMMUNITY): Payer: Medicare Other

## 2018-10-02 ENCOUNTER — Encounter (HOSPITAL_COMMUNITY): Payer: Self-pay | Admitting: Hematology

## 2018-10-02 ENCOUNTER — Other Ambulatory Visit: Payer: Self-pay

## 2018-10-02 VITALS — BP 138/57 | HR 79 | Temp 97.9°F | Resp 18 | Wt 158.6 lb

## 2018-10-02 VITALS — BP 121/46 | HR 69 | Temp 97.4°F | Resp 18

## 2018-10-02 DIAGNOSIS — N183 Chronic kidney disease, stage 3 (moderate): Secondary | ICD-10-CM

## 2018-10-02 DIAGNOSIS — I129 Hypertensive chronic kidney disease with stage 1 through stage 4 chronic kidney disease, or unspecified chronic kidney disease: Secondary | ICD-10-CM

## 2018-10-02 DIAGNOSIS — C9000 Multiple myeloma not having achieved remission: Secondary | ICD-10-CM

## 2018-10-02 DIAGNOSIS — T451X5S Adverse effect of antineoplastic and immunosuppressive drugs, sequela: Secondary | ICD-10-CM

## 2018-10-02 DIAGNOSIS — D631 Anemia in chronic kidney disease: Secondary | ICD-10-CM

## 2018-10-02 DIAGNOSIS — E785 Hyperlipidemia, unspecified: Secondary | ICD-10-CM

## 2018-10-02 DIAGNOSIS — R5383 Other fatigue: Secondary | ICD-10-CM

## 2018-10-02 DIAGNOSIS — D509 Iron deficiency anemia, unspecified: Secondary | ICD-10-CM

## 2018-10-02 DIAGNOSIS — R42 Dizziness and giddiness: Secondary | ICD-10-CM

## 2018-10-02 DIAGNOSIS — N189 Chronic kidney disease, unspecified: Secondary | ICD-10-CM

## 2018-10-02 DIAGNOSIS — E538 Deficiency of other specified B group vitamins: Secondary | ICD-10-CM

## 2018-10-02 DIAGNOSIS — G620 Drug-induced polyneuropathy: Secondary | ICD-10-CM | POA: Diagnosis not present

## 2018-10-02 DIAGNOSIS — Z888 Allergy status to other drugs, medicaments and biological substances status: Secondary | ICD-10-CM

## 2018-10-02 DIAGNOSIS — I2510 Atherosclerotic heart disease of native coronary artery without angina pectoris: Secondary | ICD-10-CM

## 2018-10-02 DIAGNOSIS — Z9221 Personal history of antineoplastic chemotherapy: Secondary | ICD-10-CM | POA: Diagnosis not present

## 2018-10-02 DIAGNOSIS — Z87891 Personal history of nicotine dependence: Secondary | ICD-10-CM

## 2018-10-02 DIAGNOSIS — J449 Chronic obstructive pulmonary disease, unspecified: Secondary | ICD-10-CM

## 2018-10-02 DIAGNOSIS — D801 Nonfamilial hypogammaglobulinemia: Secondary | ICD-10-CM

## 2018-10-02 DIAGNOSIS — Z79899 Other long term (current) drug therapy: Secondary | ICD-10-CM

## 2018-10-02 LAB — COMPREHENSIVE METABOLIC PANEL
ALT: 17 U/L (ref 0–44)
ANION GAP: 7 (ref 5–15)
AST: 20 U/L (ref 15–41)
Albumin: 3.1 g/dL — ABNORMAL LOW (ref 3.5–5.0)
Alkaline Phosphatase: 76 U/L (ref 38–126)
BUN: 20 mg/dL (ref 8–23)
CO2: 24 mmol/L (ref 22–32)
Calcium: 8.7 mg/dL — ABNORMAL LOW (ref 8.9–10.3)
Chloride: 109 mmol/L (ref 98–111)
Creatinine, Ser: 1.11 mg/dL — ABNORMAL HIGH (ref 0.44–1.00)
GFR calc Af Amer: 55 mL/min — ABNORMAL LOW (ref >60)
GFR calc non Af Amer: 48 mL/min — ABNORMAL LOW (ref >60)
Glucose, Bld: 104 mg/dL — ABNORMAL HIGH (ref 70–99)
Potassium: 3.8 mmol/L (ref 3.5–5.1)
Sodium: 140 mmol/L (ref 135–145)
Total Bilirubin: 0.3 mg/dL (ref 0.3–1.2)
Total Protein: 6.8 g/dL (ref 6.5–8.1)

## 2018-10-02 LAB — CBC WITH DIFFERENTIAL/PLATELET
Abs Immature Granulocytes: 0.05 10*3/uL (ref 0.00–0.07)
Basophils Absolute: 0.1 10*3/uL (ref 0.0–0.1)
Basophils Relative: 1 %
Eosinophils Absolute: 0.2 10*3/uL (ref 0.0–0.5)
Eosinophils Relative: 3 %
HCT: 33.2 % — ABNORMAL LOW (ref 36.0–46.0)
Hemoglobin: 10.2 g/dL — ABNORMAL LOW (ref 12.0–15.0)
IMMATURE GRANULOCYTES: 1 %
Lymphocytes Relative: 14 %
Lymphs Abs: 1.2 10*3/uL (ref 0.7–4.0)
MCH: 30.4 pg (ref 26.0–34.0)
MCHC: 30.7 g/dL (ref 30.0–36.0)
MCV: 99.1 fL (ref 80.0–100.0)
Monocytes Absolute: 1.7 10*3/uL — ABNORMAL HIGH (ref 0.1–1.0)
Monocytes Relative: 20 %
Neutro Abs: 5.3 10*3/uL (ref 1.7–7.7)
Neutrophils Relative %: 61 %
Platelets: 296 10*3/uL (ref 150–400)
RBC: 3.35 MIL/uL — ABNORMAL LOW (ref 3.87–5.11)
RDW: 15.8 % — ABNORMAL HIGH (ref 11.5–15.5)
WBC: 8.6 10*3/uL (ref 4.0–10.5)
nRBC: 0 % (ref 0.0–0.2)

## 2018-10-02 LAB — IRON AND TIBC
Iron: 22 ug/dL — ABNORMAL LOW (ref 28–170)
SATURATION RATIOS: 8 % — AB (ref 10.4–31.8)
TIBC: 293 ug/dL (ref 250–450)
UIBC: 271 ug/dL

## 2018-10-02 LAB — FOLATE: Folate: 11.7 ng/mL (ref >5.9)

## 2018-10-02 LAB — MAGNESIUM: MAGNESIUM: 2.2 mg/dL (ref 1.7–2.4)

## 2018-10-02 LAB — LACTATE DEHYDROGENASE: LDH: 158 U/L (ref 98–192)

## 2018-10-02 LAB — FERRITIN: Ferritin: 51 ng/mL (ref 11–307)

## 2018-10-02 LAB — VITAMIN B12: Vitamin B-12: 1842 pg/mL — ABNORMAL HIGH (ref 180–914)

## 2018-10-02 MED ORDER — DARBEPOETIN ALFA 300 MCG/0.6ML IJ SOSY
300.0000 ug | PREFILLED_SYRINGE | Freq: Once | INTRAMUSCULAR | Status: AC
  Administered 2018-10-02: 300 ug via SUBCUTANEOUS
  Filled 2018-10-02: qty 0.6

## 2018-10-02 MED ORDER — SODIUM CHLORIDE 0.9 % IV SOLN: 510.0000 mg | Freq: Once | INTRAVENOUS | Status: DC

## 2018-10-02 MED ORDER — SODIUM CHLORIDE 0.9 % IV SOLN
Freq: Once | INTRAVENOUS | Status: AC
  Administered 2018-10-02: 11:00:00 via INTRAVENOUS

## 2018-10-02 MED ORDER — ZOLEDRONIC ACID 4 MG/5ML IV CONC
3.0000 mg | Freq: Once | INTRAVENOUS | Status: AC
  Administered 2018-10-02: 3 mg via INTRAVENOUS
  Filled 2018-10-02: qty 3.75

## 2018-10-02 NOTE — Patient Instructions (Signed)
[facility] at [facility]  Discharge Instructions:   _______________________________________________________________  Thank you for choosing [facility] at [facility] to provide your oncology and hematology care.  To afford each patient quality time with our providers, please arrive at least 15 minutes before your scheduled appointment.  You need to re-schedule your appointment if you arrive 10 or more minutes late.  We strive to give you quality time with our providers, and arriving late affects you and other patients whose appointments are after yours.  Also, if you no show three or more times for appointments you may be dismissed from the clinic.  Again, thank you for choosing [facility] at [facility]. Our hope is that these requests will allow you access to exceptional care and in a timely manner. _______________________________________________________________  If you have questions after your visit, please contact our office at [phone] between the hours of 8:30 a.m. and 5:00 p.m. Voicemails left after 4:30 p.m. will not be returned until the following business day. _______________________________________________________________  For prescription refill requests, have your pharmacy contact our office. _______________________________________________________________  Recommendations made by the consultant and any test results will be sent to your referring physician. _______________________________________________________________ 

## 2018-10-02 NOTE — Assessment & Plan Note (Signed)
1. 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 gain, -13 -Status post Velcade and dexamethasone from 03/18/2015 through 07/28/2015, Velcade discontinued secondary to neuropathy - Revlimid and dexamethasone from February 2017 through November 2017, achieving negative SPEP and immunofixation -Seen by Dr. Norma Fredrickson for second opinion, bone marrow biopsy on 11/14/2016 shows less than 2% plasma cells, maintenance Velcade started in May 2018, held in January 2019 due to diarrhea -Noted to have biochemical relapse with M spike of 0.5, recommended to start on reduced dose pomalidomide, Ninlaro and dexamethasone -Started first cycle of Pomalyst 2 mg (days 1-21 every 28 days), ixazomib 2.3 mg (days 1, 8, 15 every 28 days), and Dexamethasone 10 mg (weekly) on 08/24/2017 due to biochemical relapse.  She has tolerated it reasonably well although she felt somewhat tired.  She felt severely tired after cycle 2.  Ninlaro was held during third cycle due to excessive tiredness.  Cycle 4 of pomalidomide and dexamethasone started on 11/26/2017.  She is tolerating it very well without any side effects.  Her M spike on 11/23/2017 has come down to 0.2 from 0.4 on 10/29/2017.  Light chain ratio is stable at 2.4.  Kappa light chains have come down from 83-46. - She was seen by Dr. Norma Fredrickson on 12/10/2017 and was recommended to cut back on dexamethasone 10 mg to every other week.  She also underwent cardiology evaluation for work-up of shortness of breath which were negative. -Ninlaro 2.3 mg weekly was started back on 04/15/2018.  Pomalidomide dose is 2 mg, 3 weeks on 1 week off.  Dexamethasone is 10 mg every other week. -She was evaluated by Dr. Norma Fredrickson on 06/10/2018.  Myeloma blood work was stable. -Her myeloma blood work from today is pending.  Last myeloma blood work  from 07/09/2018 shows SPEP of 0.2 g/dL.  Free light chain ratio was 2.2. -She is continuing to tolerate Ninlaro weekly very well.  She is also tolerating pomalidomide without any side effects.  She is taking dexamethasone 10 mg every other week. - I will see her back in 4 weeks for follow-up.  I will discuss the results of the myeloma panel at that time.   2.  Bone protection: -She will continue Zometa 3 mg every 3 months.  Her calcium is 8.7. -She was told to continue calcium twice daily.  3.  Peripheral neuropathy: -She has some numbness in the feet which is stable.  This is from prior Velcade.   4.  Normocytic anemia: -This is from combination of CKD and iron deficiency. -She is receiving Aranesp 300 mcg every 4 weeks. -Her last ferritin was 71 with percent saturation of 18.  Hence I have recommended 2 more Feraheme infusions 1 week apart.

## 2018-10-02 NOTE — Progress Notes (Signed)
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     03/18/2015 - 07/28/2015 Chemotherapy    Velcade 1.6 mg/m2 discontinued secondary to intolerance    07/28/2015 Adverse Reaction    stool incontinence, weakness, collapse upon standing, felt to be secondary to velcade    11/09/2015 - 12/13/2015 Chemotherapy    cytoxan IV 300 mg/m2 administered X 2 doses only in addition to rev/dex    11/09/2015 - 06/08/2016 Chemotherapy    Revlimid/Dexamethasone     02/05/2017 - 07/09/2017 Chemotherapy    The patient had bortezomib SQ (VELCADE) chemo injection 1.75 mg, 1 mg/m2 = 1.75 mg (66.7 % of original dose 1.5 mg/m2), Subcutaneous,  Once, 1 of 8 cycles Dose modification: 1.5 mg/m2 (original dose 1.5 mg/m2, Cycle 1, Reason: Provider Judgment, Comment: Per Dr. Norma Fredrickson recommendations from [facility] 1 mg/m2 (original dose 1.5 mg/m2, Cycle 1, Reason: Provider Judgment)  for chemotherapy treatment.      07/09/2017 Adverse Reaction    Diarrhea     Chemotherapy    Pomalyst 2 mg (Days 1-21 every 28 days), Ixazomib 2.3 mg (days 1, 8, 15 every 28 days), and Dexamethasone 10 mg (weekly)- Rx's printed on 08/24/2017.  Treatment recommendations from [facility]rickson at [facility].      CANCER STAGING: Cancer Staging No matching staging information was found for the patient.   INTERVAL HISTORY:  Ms. Bebee 78 y.o. female returns for routine follow-up. She is here  today alone. She states that the numbness in her feet is the same. She stats that she continues to take the Pomalyst, Ninlaro and Dexamethasone as directed with no missed doses. Denies any nausea, vomiting, or diarrhea. Denies any new pains. Had not noticed any recent bleeding such as epistaxis, hematuria or hematochezia. Denies recent chest pain on exertion, shortness of breath on minimal exertion, pre-syncopal episodes, or palpitations. Denies any numbness or tingling in hands. Denies any recent fevers, infections, or recent hospitalizations. Patient reports appetite at 100% and energy level at 100%.   REVIEW OF SYSTEMS:  Review of Systems  Constitutional: Positive for fatigue.  Respiratory: Positive for shortness of breath.   Neurological: Positive for dizziness and numbness.  Hematological: Bruises/bleeds easily.     PAST MEDICAL/SURGICAL HISTORY:  Past Medical History:  Diagnosis Date  . 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 CAD (coronary artery disease)   . COPD (chronic obstructive pulmonary disease) ([place])   . History of tobacco abuse   . Hyperlipidemia   . Hypertension   . 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 Multiple myeloma ([place])   . Vitamin B12 deficiency 04/15/2016   Overview:  Vitamin B12 level documented 155, January 2016 with the normal range being 211-924   Past Surgical History:  Procedure Laterality Date  . BACK SURGERY    . CARDIAC CATHETERIZATION  04/2011   right and left cath showing normal right heart pressures,but newly diagnosed coronary artery disease/drug  eluting stent placed to RCA with residual disease in the proximal RCA and LAD and ramus, normal LV function and 60-65% EF  . COLONOSCOPY N/A 09/30/2014   Procedure: COLONOSCOPY;  Surgeon: Rogene Houston, MD;  Location: [facility];  Service: Endoscopy;  Laterality: N/A;  225  . CORONARY ANGIOPLASTY WITH STENT PLACEMENT  04/2011   mid RCA: 3.0 X38 mm Promus DES.  Residual 40% disease proximally  . NECK SURGERY       SOCIAL HISTORY:  Social History   Socioeconomic History  . Marital status: Widowed    Spouse name: Not on file  . Number of children: Not on file  . Years of education: Not on file  . Highest education level: Not on file  Occupational History  . 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 Financial resource strain: Not on file  . Food insecurity:    Worry: Not on file    Inability: Not on file  . Transportation needs:    Medical: Not on file    Non-medical: Not on file  Tobacco Use  . Smoking status: Former Smoker    Packs/day: 3.00    Years: 25.00    Pack years: 75.00    Types: Cigarettes    Last attempt to quit: 07/10/1994    Years since quitting: 24.2  . Smokeless tobacco: Never Used  Substance and Sexual Activity  . Alcohol use: No  . Drug use: Never  . Sexual activity: Not on file  Lifestyle  . Physical activity:    Days per week: Not on file    Minutes per session: Not on file  . Stress: Not on file  Relationships  . Social connections:    Talks on phone: Not on file    Gets together: Not on file    Attends religious service: Not on file    Active member of club or organization: Not on file    Attends meetings of clubs or organizations: Not on file    Relationship status: Not on file  . Intimate partner violence:    Fear of current or ex partner: Not on file    Emotionally abused: Not on file    Physically abused: Not on file    Forced sexual activity: Not on file  Other Topics Concern  . Not on file  Social History Narrative  . Not on file    FAMILY HISTORY:  Family History  Problem Relation Age of Onset  . Heart failure Mother   . Cancer Father     CURRENT MEDICATIONS:  Outpatient Encounter Medications as of 10/02/2018  Medication Sig  . acyclovir (ZOVIRAX) 200 MG capsule Take 1 capsule (200 mg total) by mouth 2 (two) times daily.  Marland Kitchen aspirin EC 81 MG tablet  Take 1 tablet (81 mg total) by mouth daily.  Marland Kitchen atorvastatin (LIPITOR) 10 MG tablet TAKE ONE TABLET BY MOUTH DAILY  . Calcium Carbonate-Vit D-Min (CALCIUM 600+D PLUS MINERALS) 600-400 MG-UNIT CHEW Chew 1 tablet by mouth 3 (three) times daily.   . cefUROXime (CEFTIN) 500 MG tablet   . cyanocobalamin (,VITAMIN B-12,) 1000 MCG/ML injection Inject 1,000 mcg as directed every 30 (thirty) days.  Marland Kitchen dexamethasone (DECADRON) 2 MG tablet Take 10 mg (5 tablets) by mouth weekly  . diazepam (VALIUM) 5 MG tablet Take 1 tablet (5 mg total) by mouth every 6 (six) hours as needed for anxiety.  Marland Kitchen diltiazem (CARDIZEM CD) 240 MG 24 hr  capsule TAKE ONE CAPSULE BY MOUTH DAILY  . furosemide (LASIX) 20 MG tablet Take 1 tablet (20 mg total) by mouth daily as needed (as needed for ankle swelling).  Marland Kitchen guaiFENesin (MUCINEX) 600 MG 12 hr tablet Take 600 mg 2 (two) times daily as needed by mouth for cough or to loosen phlegm.  Marland Kitchen losartan (COZAAR) 50 MG tablet TAKE ONE TABLET BY MOUTH DAILY  . magnesium oxide (MAG-OX) 400 MG tablet TAKE ONE TABLET BY MOUTH TWICE DAILY. WHEN TAKING LASIX  . metoprolol tartrate (LOPRESSOR) 25 MG tablet TAKE ONE TABLET BY MOUTH TWICE DAILY  . ondansetron (ZOFRAN) 8 MG tablet Take 1 tablet (8 mg total) by mouth every 8 (eight) hours as needed for nausea or vomiting.  . pomalidomide (POMALYST) 2 MG capsule Take 1 capsule (2 mg total) by mouth daily. Take with water on days 1-21. Repeat every 28 days.  . potassium chloride (K-DUR,KLOR-CON) 10 MEQ tablet Take 1 tablet (10 mEq total) by mouth 3 (three) times daily. (Patient taking differently: Take 10 mEq by mouth once. )   No facility-administered encounter medications on file as of 10/02/2018.     ALLERGIES:  Allergies  Allergen Reactions  . Lisinopril Cough  . Plavix [Clopidogrel Bisulfate] Swelling     PHYSICAL EXAM:  ECOG Performance status: 1  Vitals:   10/02/18 1000  BP: (!) 138/57  Pulse: 79  Resp: 18  Temp: 97.9 F (36.6 C)   SpO2: 97%   Filed Weights   10/02/18 1000  Weight: 158 lb 9.6 oz (71.9 kg)    Physical Exam Constitutional:      Appearance: Normal appearance.  Cardiovascular:     Rate and Rhythm: Normal rate and regular rhythm.     Heart sounds: Normal heart sounds.  Pulmonary:     Effort: Pulmonary effort is normal.     Breath sounds: Normal breath sounds.  Abdominal:     Palpations: Abdomen is soft. There is no mass.     Tenderness: There is no abdominal tenderness.  Musculoskeletal:        General: No swelling.  Skin:    General: Skin is warm.  Neurological:     General: No focal deficit present.     Mental Status: She is alert and oriented to person, place, and time.  Psychiatric:        Mood and Affect: Mood normal.        Behavior: Behavior normal.      LABORATORY DATA:  I have reviewed the labs as listed.  CBC    Component Value Date/Time   WBC 8.6 10/02/2018 0910   RBC 3.35 (L) 10/02/2018 0910   HGB 10.2 (L) 10/02/2018 0910   HCT 33.2 (L) 10/02/2018 0910   PLT 296 10/02/2018 0910   MCV 99.1 10/02/2018 0910   MCH 30.4 10/02/2018 0910   MCHC 30.7 10/02/2018 0910   RDW 15.8 (H) 10/02/2018 0910   LYMPHSABS 1.2 10/02/2018 0910   MONOABS 1.7 (H) 10/02/2018 0910   EOSABS 0.2 10/02/2018 0910   BASOSABS 0.1 10/02/2018 0910   CMP Latest Ref Rng & Units 10/02/2018 08/05/2018 07/09/2018  Glucose 70 - 99 mg/dL 104(H) 96 107(H)  BUN 8 - 23 mg/dL 20 16 28(H)  Creatinine 0.44 - 1.00 mg/dL 1.11(H) 1.02(H) 1.12(H)  Sodium 135 - 145 mmol/L 140 139 140  Potassium 3.5 - 5.1 mmol/L 3.8 4.1 4.1  Chloride 98 - 111 mmol/L 109 105 108  CO2 22 - 32 mmol/L 24 27 23  Calcium 8.9 - 10.3 mg/dL 8.7(L) 8.6(L) 9.0  Total Protein 6.5 - 8.1 g/dL 6.8 6.5 6.5  Total Bilirubin 0.3 - 1.2 mg/dL 0.3 0.5 0.4  Alkaline Phos 38 - 126 U/L 76 71 61  AST 15 - 41 U/L _0 ALT 0 - 44 U/L _1 DIAGNOSTIC IMAGING:  I have independently reviewed the scans and discussed with the patient.    I have reviewed Venita Lick LPN's note and agree with the documentation.  I personally performed a face-to-face visit, made revisions and my assessment and plan is as follows.    ASSESSMENT & PLAN:   Multiple myeloma ([place]) 1. 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 gain, -13 -Status post Velcade and dexamethasone from 03/18/2015 through 07/28/2015, Velcade discontinued secondary to neuropathy - Revlimid and dexamethasone from February 2017 through November 2017, achieving negative SPEP and immunofixation -Seen by Dr. Norma Fredrickson for second opinion, bone marrow biopsy on 11/14/2016 shows less than 2% plasma cells, maintenance Velcade started in May 2018, held in January 2019 due to diarrhea -Noted to have biochemical relapse with M spike of 0.5, recommended to start on reduced dose pomalidomide, Ninlaro and dexamethasone -Started first cycle of Pomalyst 2 mg (days 1-21 every 28 days), ixazomib 2.3 mg (days 1, 8, 15 every 28 days), and Dexamethasone 10 mg (weekly) on 08/24/2017 due to biochemical relapse.  She has tolerated it reasonably well although she felt somewhat tired.  She felt severely tired after cycle 2.  Ninlaro was held during third cycle due to excessive tiredness.  Cycle 4 of pomalidomide and dexamethasone started on 11/26/2017.  She is tolerating it very well without any side effects.  Her M spike on 11/23/2017 has come down to 0.2 from 0.4 on 10/29/2017.  Light chain ratio is stable at 2.4.  Kappa light chains have come down from 83-46. - She was seen by Dr. Norma Fredrickson on 12/10/2017 and was recommended to cut back on dexamethasone 10 mg to every other week.  She also underwent cardiology evaluation for work-up of shortness of breath which were negative. -Ninlaro 2.3 mg weekly was started back on 04/15/2018.  Pomalidomide  dose is 2 mg, 3 weeks on 1 week off.  Dexamethasone is 10 mg every other week. -She was evaluated by Dr. Norma Fredrickson on 06/10/2018.  Myeloma blood work was stable. -Her myeloma blood work from today is pending.  Last myeloma blood work from 07/09/2018 shows SPEP of 0.2 g/dL.  Free light chain ratio was 2.2. -She is continuing to tolerate Ninlaro weekly very well.  She is also tolerating pomalidomide without any side effects.  She is taking dexamethasone 10 mg every other week. - I will see her back in 4 weeks for follow-up.  I will discuss the results of the myeloma panel at that time.   2.  Bone protection: -She will continue Zometa 3 mg every 3 months.  Her calcium is 8.7. -She was told to continue calcium twice daily.  3.  Peripheral neuropathy: -She has some numbness in the feet which is stable.  This is from prior Velcade.   4.  Normocytic anemia: -This is from combination of CKD and iron deficiency. -She is receiving Aranesp 300 mcg every 4 weeks. -Her last ferritin was 71 with percent saturation of 18.  Hence I have recommended 2 more Feraheme infusions 1 week apart.         Orders placed this encounter:  Orders Placed This Encounter  Procedures  . CBC with Differential/Platelet  . Comprehensive metabolic panel  . Protein electrophoresis, serum  . Kappa/lambda light chains      Derek Jack, [place] [phone]

## 2018-10-02 NOTE — Progress Notes (Signed)
Pt made presents for Zometa and Aranesp today. VSS. Pt seen by Dr. Delton Coombes.    Treatment given today per MD orders. Tolerated infusion without adverse affects. Vital signs stable. No complaints at this time. Discharged from clinic ambulatory. F/U with [facility] as scheduled.

## 2018-10-02 NOTE — Patient Instructions (Addendum)
[place] at [facility] Discharge Instructions  You were seen today by Dr. Delton Coombes. He went over your recent lab results. He wants to give you 2 doses of Feraheme for your iron. He will see you back in 4 weeks for labs and follow up.   Thank you for choosing [place] at [facility] to provide your oncology and hematology care.  To afford each patient quality time with our provider, please arrive at least 15 minutes before your scheduled appointment time.   If you have a lab appointment with the [place] [facility] please come in thru the  Main Entrance and check in at the main information desk  You need to re-schedule your appointment should you arrive 10 or more minutes late.  We strive to give you quality time with our providers, and arriving late affects you and other patients whose appointments are after yours.  Also, if you no show three or more times for appointments you may be dismissed from the clinic at the providers discretion.     Again, thank you for choosing [facility].  Our hope is that these requests will decrease the amount of time that you wait before being seen by our physicians.       _____________________________________________________________  Should you have questions after your visit to [facility], please contact our office at [phone] between the hours of 8:00 a.m. and 4:30 p.m.  Voicemails left after 4:00 p.m. will not be returned until the following business day.  For prescription refill requests, have your pharmacy contact our office and allow 72 hours.    Cancer Center Support Programs:   > Cancer Support Group  2nd Tuesday of the month 1pm-2pm, Journey Room

## 2018-10-03 LAB — KAPPA/LAMBDA LIGHT CHAINS
Kappa free light chain: 87.4 mg/L — ABNORMAL HIGH (ref 3.3–19.4)
Kappa, lambda light chain ratio: 2.08 — ABNORMAL HIGH (ref 0.26–1.65)
Lambda free light chains: 42.1 mg/L — ABNORMAL HIGH (ref 5.7–26.3)

## 2018-10-03 LAB — PROTEIN ELECTROPHORESIS, SERUM
A/G Ratio: 0.9 (ref 0.7–1.7)
Albumin ELP: 2.9 g/dL (ref 2.9–4.4)
Alpha-1-Globulin: 0.4 g/dL (ref 0.0–0.4)
Alpha-2-Globulin: 1.1 g/dL — ABNORMAL HIGH (ref 0.4–1.0)
Beta Globulin: 0.8 g/dL (ref 0.7–1.3)
Gamma Globulin: 0.8 g/dL (ref 0.4–1.8)
Globulin, Total: 3.1 g/dL (ref 2.2–3.9)
M-Spike, %: 0.2 g/dL — ABNORMAL HIGH
TOTAL PROTEIN ELP: 6 g/dL (ref 6.0–8.5)

## 2018-10-04 ENCOUNTER — Other Ambulatory Visit (HOSPITAL_COMMUNITY): Payer: Self-pay | Admitting: Hematology

## 2018-10-04 DIAGNOSIS — F419 Anxiety disorder, unspecified: Secondary | ICD-10-CM

## 2018-10-04 DIAGNOSIS — C9001 Multiple myeloma in remission: Secondary | ICD-10-CM

## 2018-10-09 ENCOUNTER — Encounter (HOSPITAL_COMMUNITY): Payer: Self-pay

## 2018-10-09 ENCOUNTER — Other Ambulatory Visit: Payer: Self-pay

## 2018-10-09 ENCOUNTER — Inpatient Hospital Stay (HOSPITAL_COMMUNITY): Payer: Medicare Other | Attending: Hematology

## 2018-10-09 VITALS — BP 109/55 | HR 70 | Temp 98.3°F | Resp 18

## 2018-10-09 DIAGNOSIS — Z9221 Personal history of antineoplastic chemotherapy: Secondary | ICD-10-CM | POA: Insufficient documentation

## 2018-10-09 DIAGNOSIS — R5383 Other fatigue: Secondary | ICD-10-CM | POA: Diagnosis not present

## 2018-10-09 DIAGNOSIS — I2510 Atherosclerotic heart disease of native coronary artery without angina pectoris: Secondary | ICD-10-CM | POA: Insufficient documentation

## 2018-10-09 DIAGNOSIS — C9000 Multiple myeloma not having achieved remission: Secondary | ICD-10-CM | POA: Diagnosis not present

## 2018-10-09 DIAGNOSIS — J449 Chronic obstructive pulmonary disease, unspecified: Secondary | ICD-10-CM | POA: Insufficient documentation

## 2018-10-09 DIAGNOSIS — G620 Drug-induced polyneuropathy: Secondary | ICD-10-CM | POA: Insufficient documentation

## 2018-10-09 DIAGNOSIS — T451X5S Adverse effect of antineoplastic and immunosuppressive drugs, sequela: Secondary | ICD-10-CM | POA: Diagnosis not present

## 2018-10-09 DIAGNOSIS — D509 Iron deficiency anemia, unspecified: Secondary | ICD-10-CM

## 2018-10-09 DIAGNOSIS — K5900 Constipation, unspecified: Secondary | ICD-10-CM | POA: Diagnosis not present

## 2018-10-09 DIAGNOSIS — I129 Hypertensive chronic kidney disease with stage 1 through stage 4 chronic kidney disease, or unspecified chronic kidney disease: Secondary | ICD-10-CM | POA: Diagnosis not present

## 2018-10-09 DIAGNOSIS — N183 Chronic kidney disease, stage 3 unspecified: Secondary | ICD-10-CM

## 2018-10-09 DIAGNOSIS — E785 Hyperlipidemia, unspecified: Secondary | ICD-10-CM | POA: Insufficient documentation

## 2018-10-09 DIAGNOSIS — D631 Anemia in chronic kidney disease: Secondary | ICD-10-CM | POA: Diagnosis not present

## 2018-10-09 DIAGNOSIS — R531 Weakness: Secondary | ICD-10-CM | POA: Diagnosis not present

## 2018-10-09 DIAGNOSIS — Z79899 Other long term (current) drug therapy: Secondary | ICD-10-CM | POA: Diagnosis not present

## 2018-10-09 DIAGNOSIS — Z7982 Long term (current) use of aspirin: Secondary | ICD-10-CM | POA: Insufficient documentation

## 2018-10-09 DIAGNOSIS — Z87891 Personal history of nicotine dependence: Secondary | ICD-10-CM | POA: Diagnosis not present

## 2018-10-09 DIAGNOSIS — E538 Deficiency of other specified B group vitamins: Secondary | ICD-10-CM

## 2018-10-09 MED ORDER — SODIUM CHLORIDE 0.9 % IV SOLN
510.0000 mg | Freq: Once | INTRAVENOUS | Status: AC
  Administered 2018-10-09: 510 mg via INTRAVENOUS
  Filled 2018-10-09: qty 510

## 2018-10-09 MED ORDER — SODIUM CHLORIDE 0.9 % IV SOLN
Freq: Once | INTRAVENOUS | Status: AC
  Administered 2018-10-09: 09:00:00 via INTRAVENOUS

## 2018-10-09 NOTE — Patient Instructions (Signed)
[facility] at [facility]  Discharge Instructions:   _______________________________________________________________  Thank you for choosing [facility] at [facility] to provide your oncology and hematology care.  To afford each patient quality time with our providers, please arrive at least 15 minutes before your scheduled appointment.  You need to re-schedule your appointment if you arrive 10 or more minutes late.  We strive to give you quality time with our providers, and arriving late affects you and other patients whose appointments are after yours.  Also, if you no show three or more times for appointments you may be dismissed from the clinic.  Again, thank you for choosing [facility]. Our hope is that these requests will allow you access to exceptional care and in a timely manner. _______________________________________________________________  If you have questions after your visit, please contact our office at [phone] between the hours of 8:30 a.m. and 5:00 p.m. Voicemails left after 4:30 p.m. will not be returned until the following business day. _______________________________________________________________  For prescription refill requests, have your pharmacy contact our office. _______________________________________________________________  Recommendations made by the consultant and any test results will be sent to your referring physician. _______________________________________________________________ 

## 2018-10-09 NOTE — Progress Notes (Signed)
Feraheme given today per MD orders. Tolerated infusion without adverse affects. Vital signs stable. No complaints at this time. Pt concerned about receiving b12 injection if she doesn't need it. Labs reviewed and b12 level doubled. Message sent to [place] and ATravis LPN. Informed pt we will call and let her know if she needs to keep her b12 injectin appointment.  Discharged from clinic ambulatory. F/U with [facility] as scheduled.

## 2018-10-16 ENCOUNTER — Other Ambulatory Visit: Payer: Self-pay

## 2018-10-16 ENCOUNTER — Inpatient Hospital Stay (HOSPITAL_COMMUNITY): Payer: Medicare Other

## 2018-10-16 ENCOUNTER — Encounter (HOSPITAL_COMMUNITY): Payer: Self-pay

## 2018-10-16 ENCOUNTER — Ambulatory Visit (HOSPITAL_COMMUNITY): Payer: Medicare Other

## 2018-10-16 VITALS — BP 105/42 | HR 74 | Temp 98.2°F | Resp 18

## 2018-10-16 DIAGNOSIS — D509 Iron deficiency anemia, unspecified: Secondary | ICD-10-CM

## 2018-10-16 DIAGNOSIS — N183 Chronic kidney disease, stage 3 unspecified: Secondary | ICD-10-CM

## 2018-10-16 DIAGNOSIS — D631 Anemia in chronic kidney disease: Secondary | ICD-10-CM

## 2018-10-16 DIAGNOSIS — C9000 Multiple myeloma not having achieved remission: Secondary | ICD-10-CM | POA: Diagnosis not present

## 2018-10-16 DIAGNOSIS — E538 Deficiency of other specified B group vitamins: Secondary | ICD-10-CM

## 2018-10-16 LAB — CBC
HCT: 33 % — ABNORMAL LOW (ref 36.0–46.0)
Hemoglobin: 9.8 g/dL — ABNORMAL LOW (ref 12.0–15.0)
MCH: 29.8 pg (ref 26.0–34.0)
MCHC: 29.7 g/dL — ABNORMAL LOW (ref 30.0–36.0)
MCV: 100.3 fL — ABNORMAL HIGH (ref 80.0–100.0)
Platelets: 255 10*3/uL (ref 150–400)
RBC: 3.29 MIL/uL — ABNORMAL LOW (ref 3.87–5.11)
RDW: 17.3 % — ABNORMAL HIGH (ref 11.5–15.5)
WBC: 9.1 10*3/uL (ref 4.0–10.5)
nRBC: 0 % (ref 0.0–0.2)

## 2018-10-16 MED ORDER — SODIUM CHLORIDE 0.9 % IV SOLN
510.0000 mg | Freq: Once | INTRAVENOUS | Status: AC
  Administered 2018-10-16: 510 mg via INTRAVENOUS
  Filled 2018-10-16: qty 510

## 2018-10-16 MED ORDER — SODIUM CHLORIDE 0.9 % IV SOLN
Freq: Once | INTRAVENOUS | Status: AC
  Administered 2018-10-16: 09:00:00 via INTRAVENOUS

## 2018-10-16 NOTE — Progress Notes (Signed)
Feraheme given today per orders. Patient tolerated it well without problems. Vitals stable and discharged home from clinic ambulatory. Follow up as scheduled.  

## 2018-10-16 NOTE — Patient Instructions (Signed)
[facility] at [facility]  Discharge Instructions:   _______________________________________________________________  Thank you for choosing [facility] at [facility] to provide your oncology and hematology care.  To afford each patient quality time with our providers, please arrive at least 15 minutes before your scheduled appointment.  You need to re-schedule your appointment if you arrive 10 or more minutes late.  We strive to give you quality time with our providers, and arriving late affects you and other patients whose appointments are after yours.  Also, if you no show three or more times for appointments you may be dismissed from the clinic.  Again, thank you for choosing [facility]. Our hope is that these requests will allow you access to exceptional care and in a timely manner. _______________________________________________________________  If you have questions after your visit, please contact our office at [phone] between the hours of 8:30 a.m. and 5:00 p.m. Voicemails left after 4:30 p.m. will not be returned until the following business day. _______________________________________________________________  For prescription refill requests, have your pharmacy contact our office. _______________________________________________________________  Recommendations made by the consultant and any test results will be sent to your referring physician. _______________________________________________________________ 

## 2018-10-17 ENCOUNTER — Inpatient Hospital Stay (HOSPITAL_COMMUNITY): Payer: Medicare Other

## 2018-10-17 VITALS — BP 129/55 | HR 83 | Temp 98.2°F | Resp 18

## 2018-10-17 DIAGNOSIS — D801 Nonfamilial hypogammaglobulinemia: Secondary | ICD-10-CM

## 2018-10-17 DIAGNOSIS — C9000 Multiple myeloma not having achieved remission: Secondary | ICD-10-CM | POA: Diagnosis not present

## 2018-10-17 DIAGNOSIS — D509 Iron deficiency anemia, unspecified: Secondary | ICD-10-CM

## 2018-10-17 DIAGNOSIS — E538 Deficiency of other specified B group vitamins: Secondary | ICD-10-CM

## 2018-10-17 DIAGNOSIS — D631 Anemia in chronic kidney disease: Secondary | ICD-10-CM

## 2018-10-17 DIAGNOSIS — N183 Chronic kidney disease, stage 3 (moderate): Secondary | ICD-10-CM

## 2018-10-17 MED ORDER — DARBEPOETIN ALFA 300 MCG/0.6ML IJ SOSY
PREFILLED_SYRINGE | INTRAMUSCULAR | Status: AC
  Filled 2018-10-17: qty 0.6

## 2018-10-17 MED ORDER — DARBEPOETIN ALFA 300 MCG/0.6ML IJ SOSY
300.0000 ug | PREFILLED_SYRINGE | Freq: Once | INTRAMUSCULAR | Status: AC
  Administered 2018-10-17: 11:00:00 300 ug via SUBCUTANEOUS

## 2018-10-21 ENCOUNTER — Other Ambulatory Visit (HOSPITAL_COMMUNITY): Payer: Self-pay | Admitting: *Deleted

## 2018-10-21 DIAGNOSIS — C9000 Multiple myeloma not having achieved remission: Secondary | ICD-10-CM

## 2018-10-21 MED ORDER — POMALIDOMIDE 2 MG PO CAPS: 2.0000 mg | ORAL_CAPSULE | Freq: Every day | ORAL | 0 refills | Status: DC

## 2018-10-29 ENCOUNTER — Inpatient Hospital Stay (HOSPITAL_COMMUNITY): Payer: Medicare Other

## 2018-10-29 ENCOUNTER — Inpatient Hospital Stay (HOSPITAL_BASED_OUTPATIENT_CLINIC_OR_DEPARTMENT_OTHER): Payer: Medicare Other | Admitting: Nurse Practitioner

## 2018-10-29 ENCOUNTER — Other Ambulatory Visit: Payer: Self-pay

## 2018-10-29 ENCOUNTER — Encounter (HOSPITAL_COMMUNITY): Payer: Self-pay | Admitting: Nurse Practitioner

## 2018-10-29 DIAGNOSIS — I129 Hypertensive chronic kidney disease with stage 1 through stage 4 chronic kidney disease, or unspecified chronic kidney disease: Secondary | ICD-10-CM

## 2018-10-29 DIAGNOSIS — R531 Weakness: Secondary | ICD-10-CM

## 2018-10-29 DIAGNOSIS — C9000 Multiple myeloma not having achieved remission: Secondary | ICD-10-CM

## 2018-10-29 DIAGNOSIS — Z9221 Personal history of antineoplastic chemotherapy: Secondary | ICD-10-CM | POA: Diagnosis not present

## 2018-10-29 DIAGNOSIS — T451X5S Adverse effect of antineoplastic and immunosuppressive drugs, sequela: Secondary | ICD-10-CM

## 2018-10-29 DIAGNOSIS — G620 Drug-induced polyneuropathy: Secondary | ICD-10-CM | POA: Diagnosis not present

## 2018-10-29 DIAGNOSIS — D509 Iron deficiency anemia, unspecified: Secondary | ICD-10-CM

## 2018-10-29 DIAGNOSIS — D631 Anemia in chronic kidney disease: Secondary | ICD-10-CM

## 2018-10-29 DIAGNOSIS — J449 Chronic obstructive pulmonary disease, unspecified: Secondary | ICD-10-CM

## 2018-10-29 DIAGNOSIS — E785 Hyperlipidemia, unspecified: Secondary | ICD-10-CM

## 2018-10-29 DIAGNOSIS — R5383 Other fatigue: Secondary | ICD-10-CM

## 2018-10-29 DIAGNOSIS — Z87891 Personal history of nicotine dependence: Secondary | ICD-10-CM

## 2018-10-29 DIAGNOSIS — Z79899 Other long term (current) drug therapy: Secondary | ICD-10-CM

## 2018-10-29 DIAGNOSIS — Z7982 Long term (current) use of aspirin: Secondary | ICD-10-CM

## 2018-10-29 DIAGNOSIS — I2510 Atherosclerotic heart disease of native coronary artery without angina pectoris: Secondary | ICD-10-CM

## 2018-10-29 DIAGNOSIS — K5900 Constipation, unspecified: Secondary | ICD-10-CM

## 2018-10-29 LAB — CBC WITH DIFFERENTIAL/PLATELET
Abs Immature Granulocytes: 0.03 10*3/uL (ref 0.00–0.07)
Basophils Absolute: 0.1 10*3/uL (ref 0.0–0.1)
Basophils Relative: 2 %
Eosinophils Absolute: 0.1 10*3/uL (ref 0.0–0.5)
Eosinophils Relative: 1 %
HCT: 37.5 % (ref 36.0–46.0)
Hemoglobin: 11.3 g/dL — ABNORMAL LOW (ref 12.0–15.0)
Immature Granulocytes: 0 %
Lymphocytes Relative: 24 %
Lymphs Abs: 1.7 10*3/uL (ref 0.7–4.0)
MCH: 32 pg (ref 26.0–34.0)
MCHC: 30.1 g/dL (ref 30.0–36.0)
MCV: 106.2 fL — ABNORMAL HIGH (ref 80.0–100.0)
Monocytes Absolute: 1.9 10*3/uL — ABNORMAL HIGH (ref 0.1–1.0)
Monocytes Relative: 26 %
Neutro Abs: 3.4 10*3/uL (ref 1.7–7.7)
Neutrophils Relative %: 47 %
Platelets: 275 10*3/uL (ref 150–400)
RBC: 3.53 MIL/uL — ABNORMAL LOW (ref 3.87–5.11)
RDW: 19.9 % — ABNORMAL HIGH (ref 11.5–15.5)
WBC: 7.2 10*3/uL (ref 4.0–10.5)
nRBC: 0 % (ref 0.0–0.2)

## 2018-10-29 LAB — COMPREHENSIVE METABOLIC PANEL
ALT: 12 U/L (ref 0–44)
AST: 17 U/L (ref 15–41)
Albumin: 3.2 g/dL — ABNORMAL LOW (ref 3.5–5.0)
Alkaline Phosphatase: 86 U/L (ref 38–126)
Anion gap: 6 (ref 5–15)
BUN: 28 mg/dL — ABNORMAL HIGH (ref 8–23)
CO2: 25 mmol/L (ref 22–32)
Calcium: 8.1 mg/dL — ABNORMAL LOW (ref 8.9–10.3)
Chloride: 108 mmol/L (ref 98–111)
Creatinine, Ser: 1.48 mg/dL — ABNORMAL HIGH (ref 0.44–1.00)
GFR calc Af Amer: 39 mL/min — ABNORMAL LOW (ref >60)
GFR calc non Af Amer: 34 mL/min — ABNORMAL LOW (ref >60)
Glucose, Bld: 92 mg/dL (ref 70–99)
Potassium: 4.1 mmol/L (ref 3.5–5.1)
Sodium: 139 mmol/L (ref 135–145)
Total Bilirubin: 0.3 mg/dL (ref 0.3–1.2)
Total Protein: 6.6 g/dL (ref 6.5–8.1)

## 2018-10-29 NOTE — Patient Instructions (Signed)
[facility] at [facility] Discharge Instructions     Thank you for choosing [facility] at [facility] to provide your oncology and hematology care.  To afford each patient quality time with our provider, please arrive at least 15 minutes before your scheduled appointment time.   If you have a lab appointment with the Cancer Center please come in thru the  Main Entrance and check in at the main information desk  You need to re-schedule your appointment should you arrive 10 or more minutes late.  We strive to give you quality time with our providers, and arriving late affects you and other patients whose appointments are after yours.  Also, if you no show three or more times for appointments you may be dismissed from the clinic at the providers discretion.     Again, thank you for choosing [facility].  Our hope is that these requests will decrease the amount of time that you wait before being seen by our physicians.       _____________________________________________________________  Should you have questions after your visit to [facility], please contact our office at [phone] between the hours of 8:00 a.m. and 4:30 p.m.  Voicemails left after 4:00 p.m. will not be returned until the following business day.  For prescription refill requests, have your pharmacy contact our office and allow 72 hours.    Cancer Center Support Programs:   > Cancer Support Group  2nd Tuesday of the month 1pm-2pm, Journey Room    

## 2018-10-29 NOTE — Assessment & Plan Note (Signed)
1.  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 gain, -13. -Status post Velcade and dexamethasone from 03/18/2015 through 07/28/2015, Velcade discontinued secondary to neuropathy. - Status post Revlimid and dexamethasone from 08/2015 through 05/2016, achieving negative SPEP and immunofixation. -Dr. Norma Fredrickson saw her for a second opinion and did a bone marrow biopsy on 11/14/2016 showed less than 2% plasma cells, started maintenance Velcade on 11/2016 through 07/2017 stopped due to diarrhea. -Noted to have a biochemical relapse with M spike of 0.5.  Started on a reduced dose of Pomalyst Ninlaro and dexamethasone started on 08/24/2017. -She was seen by Dr. Norma Fredrickson on 12/10/2017 and was recommended to cut back on the dexamethasone 10 mg to every other week.  She also underwent cardiology evaluation for work-up for shortness of breath which was negative. - Ninlaro was held during the third cycle due to excessive tiredness.  She started back Ninlaro 2.3 mg weekly on 04/15/2018.  Pomalyst dose is 2 mg, 3 weeks on 1 week off.  Dexamethasone is 10 mg every other week. -She was last seen by Dr. Norma Fredrickson on 06/10/2018.  He reports she is stable at this time.  He sees her back every 6 months. - Labs from 10/02/2018 showed M spike of 0.2 g/dL.  Free light chain ratio was 2.08.  Myeloma labs from today are pending. -Labs from 10/29/2018 showed potassium 4.1, creatinine 1.48, WBC 7.2, hemoglobin 11.3, platelets 275. -She is continuing tolerating the medication very well with some fatigue and weakness. - We will see her back in 4 weeks with repeat labs.  2.  Bone protection: -She will continue Zometa 3 mg every 3 months. -Labs on 10/29/2018 showed calcium level 8.1. -She was told to continue calcium twice daily.  3.  Peripheral neuropathy: -This is from  prior Velcade. -She still has numbness in her feet which is stable at this time.  4.  Normocytic anemia: -This is from a combination of CKD and iron deficiency anemia. -She is receiving Aranesp 300 mcg every 2 weeks. -Labs on 10/29/2018 showed hemoglobin of 11.3. -We will hold her Aranesp injection today and check again in 2 weeks. - Labs on 10/02/2018 showed ferritin 51 and percent saturation 8.  She was set up and received 2 infusions of Feraheme.  Which she tolerated well. -We will recheck her iron panel in 3 months.

## 2018-10-29 NOTE — Progress Notes (Signed)
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     03/18/2015 - 07/28/2015 Chemotherapy    Velcade 1.6 mg/m2 discontinued secondary to intolerance    07/28/2015 Adverse Reaction    stool incontinence, weakness, collapse upon standing, felt to be secondary to velcade    11/09/2015 - 12/13/2015 Chemotherapy    cytoxan IV 300 mg/m2 administered X 2 doses only in addition to rev/dex    11/09/2015 - 06/08/2016 Chemotherapy    Revlimid/Dexamethasone     02/05/2017 - 07/09/2017 Chemotherapy    The patient had bortezomib SQ (VELCADE) chemo injection 1.75 mg, 1 mg/m2 = 1.75 mg (66.7 % of original dose 1.5 mg/m2), Subcutaneous,  Once, 1 of 8 cycles Dose modification: 1.5 mg/m2 (original dose 1.5 mg/m2, Cycle 1, Reason: Provider Judgment, Comment: Per Dr. Norma Fredrickson recommendations from [facility] 1 mg/m2 (original dose 1.5 mg/m2, Cycle 1, Reason: Provider Judgment)  for chemotherapy treatment.      07/09/2017 Adverse Reaction    Diarrhea     Chemotherapy    Pomalyst 2 mg (Days 1-21 every 28 days), Ixazomib 2.3 mg (days 1, 8, 15 every 28 days), and Dexamethasone 10 mg (weekly)- Rx's printed on 08/24/2017.  Treatment recommendations from Dr. Norma Fredrickson at [facility].       INTERVAL HISTORY:  [name] 78 y.o. female returns for routine follow-up for multiple myeloma.  Patient reports she has felt more fatigued and weak over the past few weeks.  She does report constipation occasionally.  Other than that she has no complaints at  this time. Denies any nausea, vomiting, or diarrhea. Denies any new pains. Had not noticed any recent bleeding such as epistaxis, hematuria or hematochezia. Denies recent chest pain on exertion, shortness of breath on minimal exertion, pre-syncopal episodes, or palpitations. Denies any numbness or tingling in hands or feet. Denies any recent fevers, infections, or recent hospitalizations. Patient reports appetite at 100% and energy level at 50%.  She is eating well and maintaining her weight at this time.    REVIEW OF SYSTEMS:  Review of Systems  Constitutional: Positive for fatigue.  Gastrointestinal: Positive for constipation.  Neurological: Positive for extremity weakness.  All other systems reviewed and are negative.    PAST MEDICAL/SURGICAL HISTORY:  Past Medical History:  Diagnosis Date  . 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 CAD (coronary artery disease)   . COPD (chronic obstructive pulmonary disease) ([place])   . History of tobacco abuse   . Hyperlipidemia   . Hypertension   . 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 Multiple myeloma ([place])   . Vitamin B12 deficiency 04/15/2016   Overview:  Vitamin B12 level documented 155, January 2016 with the normal range being 211-924   Past Surgical History:  Procedure Laterality Date  . BACK SURGERY    . CARDIAC CATHETERIZATION  04/2011   right and left cath showing normal right heart pressures,but newly diagnosed coronary artery disease/drug eluting stent placed to RCA with residual disease in the proximal RCA and LAD  and ramus, normal LV function and 60-65% EF  . COLONOSCOPY N/A 09/30/2014   Procedure: COLONOSCOPY;  Surgeon: Rogene Houston, MD;  Location: [facility];  Service: Endoscopy;  Laterality: N/A;  225  . CORONARY ANGIOPLASTY WITH STENT PLACEMENT  04/2011   mid RCA: 3.0 X38 mm Promus DES. Residual 40% disease proximally  . NECK SURGERY       SOCIAL HISTORY:  Social History    Socioeconomic History  . Marital status: Widowed    Spouse name: Not on file  . Number of children: Not on file  . Years of education: Not on file  . Highest education level: Not on file  Occupational History  . 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 Financial resource strain: Not on file  . Food insecurity:    Worry: Not on file    Inability: Not on file  . Transportation needs:    Medical: Not on file    Non-medical: Not on file  Tobacco Use  . Smoking status: Former Smoker    Packs/day: 3.00    Years: 25.00    Pack years: 75.00    Types: Cigarettes    Last attempt to quit: 07/10/1994    Years since quitting: 24.3  . Smokeless tobacco: Never Used  Substance and Sexual Activity  . Alcohol use: No  . Drug use: Never  . Sexual activity: Not on file  Lifestyle  . Physical activity:    Days per week: Not on file    Minutes per session: Not on file  . Stress: Not on file  Relationships  . Social connections:    Talks on phone: Not on file    Gets together: Not on file    Attends religious service: Not on file    Active member of club or organization: Not on file    Attends meetings of clubs or organizations: Not on file    Relationship status: Not on file  . Intimate partner violence:    Fear of current or ex partner: Not on file    Emotionally abused: Not on file    Physically abused: Not on file    Forced sexual activity: Not on file  Other Topics Concern  . Not on file  Social History Narrative  . Not on file    FAMILY HISTORY:  Family History  Problem Relation Age of Onset  . Heart failure Mother   . Cancer Father     CURRENT MEDICATIONS:  Outpatient Encounter Medications as of 10/29/2018  Medication Sig Note  . acyclovir (ZOVIRAX) 200 MG capsule Take 1 capsule (200 mg total) by mouth 2 (two) times daily.   Marland Kitchen aspirin EC 81 MG tablet Take 1 tablet (81 mg total) by mouth daily.   Marland Kitchen atorvastatin (LIPITOR) 10 MG tablet TAKE  ONE TABLET BY MOUTH DAILY   . Calcium Carbonate-Vit D-Min (CALCIUM 600+D PLUS MINERALS) 600-400 MG-UNIT CHEW Chew 1 tablet by mouth 3 (three) times daily.    . cyanocobalamin (,VITAMIN B-12,) 1000 MCG/ML injection Inject 1,000 mcg as directed every 30 (thirty) days.   . diazepam (VALIUM) 5 MG tablet TAKE ONE TABLET BY MOUTH EVERY 6 HOURS AS NEEDED FOR ANXIETY   . diltiazem (CARDIZEM CD) 240 MG 24 hr capsule TAKE ONE CAPSULE BY MOUTH DAILY   . furosemide (LASIX) 20 MG tablet Take 1 tablet (20 mg total) by mouth daily as needed (as needed for ankle swelling).   Marland Kitchen  guaiFENesin (MUCINEX) 600 MG 12 hr tablet Take 600 mg 2 (two) times daily as needed by mouth for cough or to loosen phlegm.   Marland Kitchen losartan (COZAAR) 50 MG tablet TAKE ONE TABLET BY MOUTH DAILY   . magnesium oxide (MAG-OX) 400 MG tablet TAKE ONE TABLET BY MOUTH TWICE DAILY. WHEN TAKING LASIX   . metoprolol tartrate (LOPRESSOR) 25 MG tablet TAKE ONE TABLET BY MOUTH TWICE DAILY   . NINLARO 2.3 MG capsule    . ondansetron (ZOFRAN) 8 MG tablet Take 1 tablet (8 mg total) by mouth every 8 (eight) hours as needed for nausea or vomiting.   . pomalidomide (POMALYST) 2 MG capsule Take 1 capsule (2 mg total) by mouth daily. Take with water on days 1-21. Repeat every 28 days.   . potassium chloride (K-DUR,KLOR-CON) 10 MEQ tablet Take 1 tablet (10 mEq total) by mouth 3 (three) times daily. (Patient taking differently: Take 10 mEq by mouth once. )   . dexamethasone (DECADRON) 2 MG tablet Take 10 mg (5 tablets) by mouth weekly 10/29/2018: Every other week.   . [DISCONTINUED] cefUROXime (CEFTIN) 500 MG tablet     No facility-administered encounter medications on file as of 10/29/2018.     ALLERGIES:  Allergies  Allergen Reactions  . Lisinopril Cough  . Plavix [Clopidogrel Bisulfate] Swelling     PHYSICAL EXAM:  ECOG Performance status: 1  Vitals:   10/29/18 0900  BP: (!) 109/46  Pulse: 75  Resp: 18  Temp: 98.1 F (36.7 C)  SpO2: 95%   Filed  Weights   10/29/18 0900  Weight: 162 lb (73.5 kg)    Physical Exam Constitutional:      Appearance: Normal appearance. She is normal weight.  Cardiovascular:     Rate and Rhythm: Normal rate and regular rhythm.     Heart sounds: Normal heart sounds.  Pulmonary:     Effort: Pulmonary effort is normal.     Breath sounds: Normal breath sounds.  Abdominal:     General: Bowel sounds are normal.     Palpations: Abdomen is soft.  Musculoskeletal: Normal range of motion.  Skin:    General: Skin is warm and dry.  Neurological:     Mental Status: She is alert and oriented to person, place, and time. Mental status is at baseline.  Psychiatric:        Mood and Affect: Mood normal.        Behavior: Behavior normal.        Thought Content: Thought content normal.        Judgment: Judgment normal.      LABORATORY DATA:  I have reviewed the labs as listed.  CBC    Component Value Date/Time   WBC 7.2 10/29/2018 0852   RBC 3.53 (L) 10/29/2018 0852   HGB 11.3 (L) 10/29/2018 0852   HCT 37.5 10/29/2018 0852   PLT 275 10/29/2018 0852   MCV 106.2 (H) 10/29/2018 0852   MCH 32.0 10/29/2018 0852   MCHC 30.1 10/29/2018 0852   RDW 19.9 (H) 10/29/2018 0852   LYMPHSABS 1.7 10/29/2018 0852   MONOABS 1.9 (H) 10/29/2018 0852   EOSABS 0.1 10/29/2018 0852   BASOSABS 0.1 10/29/2018 0852   CMP Latest Ref Rng & Units 10/29/2018 10/02/2018 08/05/2018  Glucose 70 - 99 mg/dL 92 104(H) 96  BUN 8 - 23 mg/dL 28(H) 20 16  Creatinine 0.44 - 1.00 mg/dL 1.48(H) 1.11(H) 1.02(H)  Sodium 135 - 145 mmol/L 139 140 139  Potassium 3.5 -  5.1 mmol/L 4.1 3.8 4.1  Chloride 98 - 111 mmol/L 108 109 105  CO2 22 - 32 mmol/L '25 24 27  '$ Calcium 8.9 - 10.3 mg/dL 8.1(L) 8.7(L) 8.6(L)  Total Protein 6.5 - 8.1 g/dL 6.6 6.8 6.5  Total Bilirubin 0.3 - 1.2 mg/dL 0.3 0.3 0.5  Alkaline Phos 38 - 126 U/L 86 76 71  AST 15 - 41 U/L '17 20 19  '$ ALT 0 - 44 U/L '12 17 19   '$ I personally performed a face-to-face visit. All questions were  answered to patient's stated satisfaction. Encouraged patient to call with any new concerns or questions before his next visit to the cancer center and we can certain see him sooner, if needed.      ASSESSMENT & PLAN:   Multiple myeloma ([place]) 1.  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 gain, -13. -Status post Velcade and dexamethasone from 03/18/2015 through 07/28/2015, Velcade discontinued secondary to neuropathy. - Status post Revlimid and dexamethasone from 08/2015 through 05/2016, achieving negative SPEP and immunofixation. -Dr. Norma Fredrickson saw her for a second opinion and did a bone marrow biopsy on 11/14/2016 showed less than 2% plasma cells, started maintenance Velcade on 11/2016 through 07/2017 stopped due to diarrhea. -Noted to have a biochemical relapse with M spike of 0.5.  Started on a reduced dose of Pomalyst Ninlaro and dexamethasone started on 08/24/2017. -She was seen by Dr. Norma Fredrickson on 12/10/2017 and was recommended to cut back on the dexamethasone 10 mg to every other week.  She also underwent cardiology evaluation for work-up for shortness of breath which was negative. - Ninlaro was held during the third cycle due to excessive tiredness.  She started back Ninlaro 2.3 mg weekly on 04/15/2018.  Pomalyst dose is 2 mg, 3 weeks on 1 week off.  Dexamethasone is 10 mg every other week. -She was last seen by Dr. Norma Fredrickson on 06/10/2018.  He reports she is stable at this time.  He sees her back every 6 months. - Labs from 10/02/2018 showed M spike of 0.2 g/dL.  Free light chain ratio was 2.08.  Myeloma labs from today are pending. -Labs from 10/29/2018 showed potassium 4.1, creatinine 1.48, WBC 7.2, hemoglobin 11.3, platelets 275. -She is continuing tolerating the medication very well with some fatigue and weakness. - We will  see her back in 4 weeks with repeat labs.  2.  Bone protection: -She will continue Zometa 3 mg every 3 months. -Labs on 10/29/2018 showed calcium level 8.1. -She was told to continue calcium twice daily.  3.  Peripheral neuropathy: -This is from prior Velcade. -She still has numbness in her feet which is stable at this time.  4.  Normocytic anemia: -This is from a combination of CKD and iron deficiency anemia. -She is receiving Aranesp 300 mcg every 2 weeks. -Labs on 10/29/2018 showed hemoglobin of 11.3. -We will hold her Aranesp injection today and check again in 2 weeks. - Labs on 10/02/2018 showed ferritin 51 and percent saturation 8.  She was set up and received 2 infusions of Feraheme.  Which she tolerated well. -We will recheck her iron panel in 3 months.      Orders placed this encounter:  Orders Placed This Encounter  Procedures  . Lactate dehydrogenase  . Protein electrophoresis, serum  . Kappa/lambda light chains  .  Magnesium  . CBC with Differential/Platelet  . Comprehensive metabolic panel      Francene Finders, FNP-C [place] [phone]

## 2018-10-29 NOTE — Progress Notes (Signed)
Carol Bradley presents today for Aranesp injection. Hemoglobin 11.3. Aranesp held per parameters. Discharged self ambulatory in satisfactory condition.

## 2018-10-30 ENCOUNTER — Other Ambulatory Visit (HOSPITAL_COMMUNITY): Payer: Medicare Other

## 2018-10-30 ENCOUNTER — Ambulatory Visit (HOSPITAL_COMMUNITY): Payer: Medicare Other | Admitting: Nurse Practitioner

## 2018-10-30 ENCOUNTER — Ambulatory Visit (HOSPITAL_COMMUNITY): Payer: Medicare Other

## 2018-10-30 LAB — PROTEIN ELECTROPHORESIS, SERUM
A/G Ratio: 1.1 (ref 0.7–1.7)
Albumin ELP: 3.1 g/dL (ref 2.9–4.4)
Alpha-1-Globulin: 0.3 g/dL (ref 0.0–0.4)
Alpha-2-Globulin: 1 g/dL (ref 0.4–1.0)
Beta Globulin: 0.7 g/dL (ref 0.7–1.3)
Gamma Globulin: 0.9 g/dL (ref 0.4–1.8)
Globulin, Total: 2.9 g/dL (ref 2.2–3.9)
M-Spike, %: 0.3 g/dL — ABNORMAL HIGH
Total Protein ELP: 6 g/dL (ref 6.0–8.5)

## 2018-10-30 LAB — KAPPA/LAMBDA LIGHT CHAINS
Kappa free light chain: 99.5 mg/L — ABNORMAL HIGH (ref 3.3–19.4)
Kappa, lambda light chain ratio: 3.77 — ABNORMAL HIGH (ref 0.26–1.65)
Lambda free light chains: 26.4 mg/L — ABNORMAL HIGH (ref 5.7–26.3)

## 2018-11-07 ENCOUNTER — Ambulatory Visit (HOSPITAL_COMMUNITY): Payer: Medicare Other

## 2018-11-12 ENCOUNTER — Other Ambulatory Visit (HOSPITAL_COMMUNITY): Payer: Self-pay | Admitting: Hematology

## 2018-11-12 DIAGNOSIS — C9000 Multiple myeloma not having achieved remission: Secondary | ICD-10-CM

## 2018-11-13 ENCOUNTER — Inpatient Hospital Stay (HOSPITAL_COMMUNITY): Payer: Medicare Other

## 2018-11-13 ENCOUNTER — Inpatient Hospital Stay (HOSPITAL_COMMUNITY): Payer: Medicare Other | Attending: Hematology

## 2018-11-13 ENCOUNTER — Encounter (HOSPITAL_COMMUNITY): Payer: Self-pay

## 2018-11-13 ENCOUNTER — Other Ambulatory Visit: Payer: Self-pay

## 2018-11-13 VITALS — BP 101/52 | HR 71 | Temp 97.7°F | Resp 18

## 2018-11-13 DIAGNOSIS — D631 Anemia in chronic kidney disease: Secondary | ICD-10-CM

## 2018-11-13 DIAGNOSIS — C9000 Multiple myeloma not having achieved remission: Secondary | ICD-10-CM | POA: Insufficient documentation

## 2018-11-13 DIAGNOSIS — E538 Deficiency of other specified B group vitamins: Secondary | ICD-10-CM

## 2018-11-13 DIAGNOSIS — N183 Chronic kidney disease, stage 3 unspecified: Secondary | ICD-10-CM

## 2018-11-13 DIAGNOSIS — D801 Nonfamilial hypogammaglobulinemia: Secondary | ICD-10-CM

## 2018-11-13 DIAGNOSIS — D509 Iron deficiency anemia, unspecified: Secondary | ICD-10-CM

## 2018-11-13 LAB — COMPREHENSIVE METABOLIC PANEL
ALT: 14 U/L (ref 0–44)
AST: 14 U/L — ABNORMAL LOW (ref 15–41)
Albumin: 3 g/dL — ABNORMAL LOW (ref 3.5–5.0)
Alkaline Phosphatase: 73 U/L (ref 38–126)
Anion gap: 9 (ref 5–15)
BUN: 26 mg/dL — ABNORMAL HIGH (ref 8–23)
CO2: 26 mmol/L (ref 22–32)
Calcium: 8.3 mg/dL — ABNORMAL LOW (ref 8.9–10.3)
Chloride: 109 mmol/L (ref 98–111)
Creatinine, Ser: 1.52 mg/dL — ABNORMAL HIGH (ref 0.44–1.00)
GFR calc Af Amer: 38 mL/min — ABNORMAL LOW (ref >60)
GFR calc non Af Amer: 32 mL/min — ABNORMAL LOW (ref >60)
Glucose, Bld: 104 mg/dL — ABNORMAL HIGH (ref 70–99)
Potassium: 3.9 mmol/L (ref 3.5–5.1)
Sodium: 144 mmol/L (ref 135–145)
Total Bilirubin: 0.5 mg/dL (ref 0.3–1.2)
Total Protein: 6.3 g/dL — ABNORMAL LOW (ref 6.5–8.1)

## 2018-11-13 LAB — CBC WITH DIFFERENTIAL/PLATELET
Abs Immature Granulocytes: 0.06 10*3/uL (ref 0.00–0.07)
Basophils Absolute: 0.1 10*3/uL (ref 0.0–0.1)
Basophils Relative: 1 %
Eosinophils Absolute: 0.4 10*3/uL (ref 0.0–0.5)
Eosinophils Relative: 4 %
HCT: 35 % — ABNORMAL LOW (ref 36.0–46.0)
Hemoglobin: 10.8 g/dL — ABNORMAL LOW (ref 12.0–15.0)
Immature Granulocytes: 1 %
Lymphocytes Relative: 18 %
Lymphs Abs: 1.5 10*3/uL (ref 0.7–4.0)
MCH: 32.5 pg (ref 26.0–34.0)
MCHC: 30.9 g/dL (ref 30.0–36.0)
MCV: 105.4 fL — ABNORMAL HIGH (ref 80.0–100.0)
Monocytes Absolute: 2.6 10*3/uL — ABNORMAL HIGH (ref 0.1–1.0)
Monocytes Relative: 30 %
Neutro Abs: 4.1 10*3/uL (ref 1.7–7.7)
Neutrophils Relative %: 46 %
Platelets: 223 10*3/uL (ref 150–400)
RBC: 3.32 MIL/uL — ABNORMAL LOW (ref 3.87–5.11)
RDW: 18.5 % — ABNORMAL HIGH (ref 11.5–15.5)
WBC: 8.7 10*3/uL (ref 4.0–10.5)
nRBC: 0 % (ref 0.0–0.2)

## 2018-11-13 LAB — LACTATE DEHYDROGENASE: LDH: 123 U/L (ref 98–192)

## 2018-11-13 LAB — MAGNESIUM: Magnesium: 2.3 mg/dL (ref 1.7–2.4)

## 2018-11-13 MED ORDER — DARBEPOETIN ALFA 300 MCG/0.6ML IJ SOSY
PREFILLED_SYRINGE | INTRAMUSCULAR | Status: AC
  Filled 2018-11-13: qty 0.6

## 2018-11-13 MED ORDER — DARBEPOETIN ALFA 300 MCG/0.6ML IJ SOSY
300.0000 ug | PREFILLED_SYRINGE | Freq: Once | INTRAMUSCULAR | Status: AC
  Administered 2018-11-13: 300 ug via SUBCUTANEOUS

## 2018-11-14 ENCOUNTER — Other Ambulatory Visit: Payer: Self-pay | Admitting: Cardiology

## 2018-11-14 ENCOUNTER — Ambulatory Visit (HOSPITAL_COMMUNITY): Payer: Medicare Other

## 2018-11-14 LAB — PROTEIN ELECTROPHORESIS, SERUM
A/G Ratio: 1 (ref 0.7–1.7)
Albumin ELP: 2.8 g/dL — ABNORMAL LOW (ref 2.9–4.4)
Alpha-1-Globulin: 0.3 g/dL (ref 0.0–0.4)
Alpha-2-Globulin: 1 g/dL (ref 0.4–1.0)
Beta Globulin: 0.7 g/dL (ref 0.7–1.3)
Gamma Globulin: 0.8 g/dL (ref 0.4–1.8)
Globulin, Total: 2.7 g/dL (ref 2.2–3.9)
M-Spike, %: 0.3 g/dL — ABNORMAL HIGH
Total Protein ELP: 5.5 g/dL — ABNORMAL LOW (ref 6.0–8.5)

## 2018-11-14 LAB — KAPPA/LAMBDA LIGHT CHAINS
Kappa free light chain: 129.5 mg/L — ABNORMAL HIGH (ref 3.3–19.4)
Kappa, lambda light chain ratio: 3.94 — ABNORMAL HIGH (ref 0.26–1.65)
Lambda free light chains: 32.9 mg/L — ABNORMAL HIGH (ref 5.7–26.3)

## 2018-11-15 NOTE — Telephone Encounter (Deleted)
Lopressor 25 mg refilled 

## 2018-11-21 ENCOUNTER — Other Ambulatory Visit (HOSPITAL_COMMUNITY): Payer: Self-pay | Admitting: *Deleted

## 2018-11-21 DIAGNOSIS — C9000 Multiple myeloma not having achieved remission: Secondary | ICD-10-CM

## 2018-11-21 MED ORDER — POMALIDOMIDE 2 MG PO CAPS: 2.0000 mg | ORAL_CAPSULE | Freq: Every day | ORAL | 0 refills | Status: DC

## 2018-11-27 ENCOUNTER — Inpatient Hospital Stay (HOSPITAL_COMMUNITY): Payer: Medicare Other

## 2018-11-27 ENCOUNTER — Encounter (HOSPITAL_COMMUNITY): Payer: Self-pay | Admitting: Hematology

## 2018-11-27 ENCOUNTER — Other Ambulatory Visit (HOSPITAL_COMMUNITY): Payer: Medicare Other

## 2018-11-27 ENCOUNTER — Ambulatory Visit (HOSPITAL_COMMUNITY): Payer: Medicare Other

## 2018-11-27 ENCOUNTER — Other Ambulatory Visit: Payer: Self-pay

## 2018-11-27 ENCOUNTER — Inpatient Hospital Stay (HOSPITAL_BASED_OUTPATIENT_CLINIC_OR_DEPARTMENT_OTHER): Payer: Medicare Other | Admitting: Hematology

## 2018-11-27 DIAGNOSIS — E538 Deficiency of other specified B group vitamins: Secondary | ICD-10-CM

## 2018-11-27 DIAGNOSIS — N183 Chronic kidney disease, stage 3 (moderate): Secondary | ICD-10-CM | POA: Diagnosis not present

## 2018-11-27 DIAGNOSIS — D631 Anemia in chronic kidney disease: Secondary | ICD-10-CM | POA: Diagnosis not present

## 2018-11-27 DIAGNOSIS — C9000 Multiple myeloma not having achieved remission: Secondary | ICD-10-CM | POA: Diagnosis not present

## 2018-11-27 DIAGNOSIS — D509 Iron deficiency anemia, unspecified: Secondary | ICD-10-CM

## 2018-11-27 LAB — BASIC METABOLIC PANEL
Anion gap: 9 (ref 5–15)
BUN: 23 mg/dL (ref 8–23)
CO2: 24 mmol/L (ref 22–32)
Calcium: 8.7 mg/dL — ABNORMAL LOW (ref 8.9–10.3)
Chloride: 109 mmol/L (ref 98–111)
Creatinine, Ser: 1.24 mg/dL — ABNORMAL HIGH (ref 0.44–1.00)
GFR calc Af Amer: 48 mL/min — ABNORMAL LOW (ref >60)
GFR calc non Af Amer: 42 mL/min — ABNORMAL LOW (ref >60)
Glucose, Bld: 100 mg/dL — ABNORMAL HIGH (ref 70–99)
Potassium: 4.1 mmol/L (ref 3.5–5.1)
Sodium: 142 mmol/L (ref 135–145)

## 2018-11-27 LAB — CBC
HCT: 39.3 % (ref 36.0–46.0)
Hemoglobin: 11.9 g/dL — ABNORMAL LOW (ref 12.0–15.0)
MCH: 32.8 pg (ref 26.0–34.0)
MCHC: 30.3 g/dL (ref 30.0–36.0)
MCV: 108.3 fL — ABNORMAL HIGH (ref 80.0–100.0)
Platelets: 265 10*3/uL (ref 150–400)
RBC: 3.63 MIL/uL — ABNORMAL LOW (ref 3.87–5.11)
RDW: 18.5 % — ABNORMAL HIGH (ref 11.5–15.5)
WBC: 9.1 10*3/uL (ref 4.0–10.5)
nRBC: 0 % (ref 0.0–0.2)

## 2018-11-27 NOTE — Progress Notes (Signed)
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     03/18/2015 - 07/28/2015 Chemotherapy    Velcade 1.6 mg/m2 discontinued secondary to intolerance    07/28/2015 Adverse Reaction    stool incontinence, weakness, collapse upon standing, felt to be secondary to velcade    11/09/2015 - 12/13/2015 Chemotherapy    cytoxan IV 300 mg/m2 administered X 2 doses only in addition to rev/dex    11/09/2015 - 06/08/2016 Chemotherapy    Revlimid/Dexamethasone     02/05/2017 - 07/09/2017 Chemotherapy    The patient had bortezomib SQ (VELCADE) chemo injection 1.75 mg, 1 mg/m2 = 1.75 mg (66.7 % of original dose 1.5 mg/m2), Subcutaneous,  Once, 1 of 8 cycles Dose modification: 1.5 mg/m2 (original dose 1.5 mg/m2, Cycle 1, Reason: Provider Judgment, Comment: Per Dr. Norma Fredrickson recommendations from [facility] 1 mg/m2 (original dose 1.5 mg/m2, Cycle 1, Reason: Provider Judgment)  for chemotherapy treatment.      07/09/2017 Adverse Reaction    Diarrhea     Chemotherapy    Pomalyst 2 mg (Days 1-21 every 28 days), Ixazomib 2.3 mg (days 1, 8, 15 every 28 days), and Dexamethasone 10 mg (weekly)- Rx's printed on 08/24/2017.  Treatment recommendations from Dr. Norma Fredrickson at [facility].      CANCER STAGING: Cancer Staging No matching staging information was found for the patient.   INTERVAL HISTORY:  Carol Bradley 78 y.o. female returns for routine follow-up. She is here today alone. She states that she has been doing well since her last visit. She states that continues taking her Ninlaro, Pomalyst  and dexamethasone as prescribed with no missed doses. She states that she has had some leg swelling as well. Denies any nausea, vomiting, or diarrhea. Denies any new pains. Had not noticed any recent bleeding such as epistaxis, hematuria or hematochezia. Denies recent chest pain on exertion, shortness of breath on minimal exertion, pre-syncopal episodes, or palpitations. Denies any numbness or tingling in hands. Denies any recent fevers, infections, or recent hospitalizations. Patient reports appetite at 100% and energy level at 75%.   REVIEW OF SYSTEMS:  Review of Systems  Cardiovascular: Positive for leg swelling.  Neurological: Positive for numbness.     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   Multiple myeloma (H[facility])    Vitamin B12 deficiency 04/15/2016   Overview:  Vitamin B12 level documented 155, January 2016 with the normal range being 211-924   Past Surgical History:  Procedure Laterality Date   BACK SURGERY     CARDIAC CATHETERIZATION  04/2011   right and left cath showing normal right heart pressures,but newly diagnosed coronary artery disease/drug eluting stent placed to RCA with residual disease in the proximal RCA and LAD and ramus, normal LV function and 60-65% EF  COLONOSCOPY N/A 09/30/2014   Procedure: COLONOSCOPY;  Surgeon: Rogene Houston, MD;  Location: [facility];  Service: Endoscopy;  Laterality: N/A;  225   CORONARY ANGIOPLASTY WITH STENT PLACEMENT  04/2011   mid RCA: 3.0 X38 mm Promus DES. 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  Social Designer, fashion/clothing strain: Not on file   Food insecurity:    Worry: Not on file    Inability: Not on file   Transportation needs:    Medical: Not on file    Non-medical: Not on file  Tobacco Use   Smoking status: Former Smoker    Packs/day: 3.00    Years: 25.00    Pack years: 75.00    Types: Cigarettes    Last attempt to quit: 07/10/1994    Years since quitting: 24.4   Smokeless tobacco: Never Used  Substance and Sexual Activity   Alcohol use: No   Drug use: Never   Sexual activity: Not on file  Lifestyle   Physical activity:    Days per week: Not on file    Minutes per session: Not on file   Stress: Not on file  Relationships   Social connections:    Talks on phone: Not on file    Gets together: Not on file    Attends religious service: Not on file    Active member of club or organization: Not on file    Attends meetings of clubs or organizations: Not on file    Relationship status: Not on file   Intimate partner violence:    Fear of current or ex partner: Not on file    Emotionally abused: Not on file    Physically abused: Not on file    Forced sexual activity: Not on file  Other Topics Concern   Not on file  Social History Narrative   Not on file    FAMILY HISTORY:  Family History  Problem Relation Age of Onset   Heart failure Mother    Cancer Father     CURRENT MEDICATIONS:  Outpatient Encounter Medications as of 11/27/2018  Medication Sig   acyclovir (ZOVIRAX) 200 MG capsule Take 1 capsule (200 mg total) by mouth 2 (two) times daily.   aspirin EC 81 MG tablet Take 1 tablet (81 mg total) by mouth daily.   atorvastatin (LIPITOR) 10 MG tablet TAKE ONE TABLET BY MOUTH DAILY   Calcium Carbonate-Vit D-Min (CALCIUM 600+D PLUS MINERALS)  600-400 MG-UNIT CHEW Chew 1 tablet by mouth 3 (three) times daily.    dexamethasone (DECADRON) 2 MG tablet Take five tablets (74m) by mouth weekly. (Patient taking differently: Take five tablets (172m by mouth bi-weekly.)   diltiazem (CARDIZEM CD) 240 MG 24 hr capsule TAKE ONE CAPSULE BY MOUTH DAILY   fluticasone (FLONASE) 50 MCG/ACT nasal spray Place into the nose.   losartan (COZAAR) 50 MG tablet TAKE ONE TABLET BY MOUTH DAILY   magnesium oxide (MAG-OX) 400 MG tablet TAKE ONE TABLET BY MOUTH TWICE DAILY. WHEN TAKING LASIX   metoprolol tartrate (LOPRESSOR) 25 MG tablet TAKE ONE TABLET BY MOUTH  TWICE DAILY   NINLARO 2.3 MG capsule    pomalidomide (POMALYST) 2 MG capsule Take 1 capsule (2 mg total) by mouth daily. Take with water on days 1-21. Repeat every 28 days.   potassium chloride (K-DUR,KLOR-CON) 10 MEQ tablet Take 1 tablet (10 mEq total) by mouth 3 (three) times daily. (Patient taking differently: Take 10 mEq by mouth once. )   cyanocobalamin (,VITAMIN B-12,) 1000 MCG/ML injection Inject 1,000 mcg as directed every 30 (thirty) days.   diazepam (VALIUM) 5 MG tablet TAKE ONE TABLET BY MOUTH EVERY 6 HOURS AS NEEDED FOR ANXIETY (Patient not taking: Reported on 11/27/2018)   furosemide (LASIX) 20 MG tablet Take 1 tablet (20 mg total) by mouth daily as needed (as needed for ankle swelling). (Patient not taking: Reported on 11/27/2018)   guaiFENesin (MUCINEX) 600 MG 12 hr tablet Take 600 mg 2 (two) times daily as needed by mouth for cough or to loosen phlegm.   ondansetron (ZOFRAN) 8 MG tablet Take 1 tablet (8 mg total) by mouth every 8 (eight) hours as needed for nausea or vomiting. (Patient not taking: Reported on 11/27/2018)   pantoprazole (PROTONIX) 40 MG tablet    umeclidinium-vilanterol (ANORO ELLIPTA) 62.5-25 MCG/INH AEPB Inhale into the lungs.   No facility-administered encounter medications on file as of 11/27/2018.     ALLERGIES:  Allergies  Allergen Reactions    Lisinopril Cough   Plavix [Clopidogrel Bisulfate] Swelling     PHYSICAL EXAM:  ECOG Performance status: 1  Vitals:   11/27/18 1400  BP: (!) 142/43  Pulse: 66  Resp: 18  Temp: 97.7 F (36.5 C)  SpO2: 96%   Filed Weights   11/27/18 1400  Weight: 167 lb (75.8 kg)    Physical Exam Vitals signs reviewed.  Constitutional:      Appearance: Normal appearance.  Cardiovascular:     Rate and Rhythm: Normal rate and regular rhythm.     Heart sounds: Normal heart sounds.  Pulmonary:     Effort: Pulmonary effort is normal.     Breath sounds: Normal breath sounds.  Abdominal:     General: There is no distension.     Palpations: Abdomen is soft. There is no mass.  Musculoskeletal:        General: No swelling.  Skin:    General: Skin is warm.  Neurological:     General: No focal deficit present.     Mental Status: She is alert and oriented to person, place, and time.  Psychiatric:        Mood and Affect: Mood normal.        Behavior: Behavior normal.      LABORATORY DATA:  I have reviewed the labs as listed.  CBC    Component Value Date/Time   WBC 9.1 11/27/2018 1323   RBC 3.63 (L) 11/27/2018 1323   HGB 11.9 (L) 11/27/2018 1323   HCT 39.3 11/27/2018 1323   PLT 265 11/27/2018 1323   MCV 108.3 (H) 11/27/2018 1323   MCH 32.8 11/27/2018 1323   MCHC 30.3 11/27/2018 1323   RDW 18.5 (H) 11/27/2018 1323   LYMPHSABS 1.5 11/13/2018 0840   MONOABS 2.6 (H) 11/13/2018 0840   EOSABS 0.4 11/13/2018 0840   BASOSABS 0.1 11/13/2018 0840   CMP Latest Ref Rng & Units 11/27/2018 11/13/2018 10/29/2018  Glucose 70 - 99 mg/dL 100(H) 104(H) 92  BUN 8 - 23 mg/dL 23 26(H) 28(H)  Creatinine 0.44 - 1.00 mg/dL 1.24(H) 1.52(H) 1.48(H)  Sodium 135 - 145  mmol/L 142 144 139  Potassium 3.5 - 5.1 mmol/L 4.1 3.9 4.1  Chloride 98 - 111 mmol/L 109 109 108  CO2 22 - 32 mmol/L _0 Calcium 8.9 - 10.3 mg/dL 8.7(L) 8.3(L) 8.1(L)  Total Protein 6.5 - 8.1 g/dL - 6.3(L) 6.6  Total Bilirubin 0.3 -  1.2 mg/dL - 0.5 0.3  Alkaline Phos 38 - 126 U/L - 73 86  AST 15 - 41 U/L - 14(L) 17  ALT 0 - 44 U/L - 14 12       DIAGNOSTIC IMAGING:  I have independently reviewed the scans and discussed with the patient.   I have reviewed Venita Lick LPN's note and agree with the documentation.  I personally performed a face-to-face visit, made revisions and my assessment and plan is as follows.    ASSESSMENT & PLAN:   Multiple myeloma ([place]) 1. 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 gain, -13 -Status post Velcade and dexamethasone from 03/18/2015 through 07/28/2015, Velcade discontinued secondary to neuropathy - Revlimid and dexamethasone from February 2017 through November 2017, achieving negative SPEP and immunofixation -Seen by Dr. Norma Fredrickson for second opinion, bone marrow biopsy on 11/14/2016 shows less than 2% plasma cells, maintenance Velcade started in May 2018, held in January 2019 due to diarrhea -Noted to have biochemical relapse with M spike of 0.5, recommended to start on reduced dose pomalidomide, Ninlaro and dexamethasone -Started first cycle of Pomalyst 2 mg (days 1-21 every 28 days), ixazomib 2.3 mg (days 1, 8, 15 every 28 days), and Dexamethasone 10 mg (weekly) on 08/24/2017 due to biochemical relapse.  She has tolerated it reasonably well although she felt somewhat tired.  She felt severely tired after cycle 2.  Ninlaro was held during third cycle due to excessive tiredness.  Cycle 4 of pomalidomide and dexamethasone started on 11/26/2017.  She is tolerating it very well without any side effects.  Her M spike on 11/23/2017 has come down to 0.2 from 0.4 on 10/29/2017.  Light chain ratio is stable at 2.4.  Kappa light chains have come down from 83-46. -Ninlaro 2.3 mg weekly was started back on 04/15/2018.  Pomalidomide  dose is 2 mg, 3 weeks on 1 week off.  Dexamethasone is 10 mg every other week. -She was evaluated by Dr. Norma Fredrickson on 06/10/2018.  Myeloma blood work was stable. -We discussed her myeloma blood work from 11/13/2018.  Kappa light chains has increased to 129.5.  Free light chain ratio was 3.94, previously 2.08.  M spike was 0.3 g, previously 0.2 g on 10/02/2018. - Hence I have recommended increasing dexamethasone to 10 mg every week.  She has mild swelling in her legs towards the end of the day.  She will use Lasix as needed.  2.  Bone protection: -We will continue Zometa 3 mg every 3 months. -She will continue calcium twice daily.  3.  Peripheral neuropathy: -She has some numbness in the feet which is stable.  Is from prior Velcade.   4.  Normocytic anemia: -This is from combination of CKD and iron deficiency. - Last Feraheme was on 10/09/2018 and 10/16/2018. -She is receiving Aranesp 300 mcg every 2 weeks.      Total time spent is 25 minutes with more than 50% of the time spent face-to-face discussing treatment plan and coordination of care.  Orders placed this encounter:  No orders of the defined types were placed in this encounter.     Derek Jack, MD [place] [phone]

## 2018-11-27 NOTE — Progress Notes (Signed)
Hgb 11.9 today. No Aranesp needed. Reported HGB to ATravis LPN via message notification.

## 2018-11-27 NOTE — Patient Instructions (Addendum)
[place] at [facility] Discharge Instructions  You were seen today by Dr. Delton Coombes. He went over your recent lab results. He will see you back in for labs and follow up.  He would like you to take your fluid pill as needed. You can try wearing compression socks to help with the swelling.   Thank you for choosing [place] at [facility] to provide your oncology and hematology care.  To afford each patient quality time with our provider, please arrive at least 15 minutes before your scheduled appointment time.   If you have a lab appointment with the [facility] please come in thru the  Main Entrance and check in at the main information desk  You need to re-schedule your appointment should you arrive 10 or more minutes late.  We strive to give you quality time with our providers, and arriving late affects you and other patients whose appointments are after yours.  Also, if you no show three or more times for appointments you may be dismissed from the clinic at the providers discretion.     Again, thank you for choosing [facility].  Our hope is that these requests will decrease the amount of time that you wait before being seen by our physicians.       _____________________________________________________________  Should you have questions after your visit to [facility], please contact our office at [phone] between the hours of 8:00 a.m. and 4:30 p.m.  Voicemails left after 4:00 p.m. will not be returned until the following business day.  For prescription refill requests, have your pharmacy contact our office and allow 72 hours.    Cancer Center Support Programs:   > Cancer Support Group  2nd Tuesday of the month 1pm-2pm, Journey Room

## 2018-11-27 NOTE — Assessment & Plan Note (Signed)
1. 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 gain, -13 -Status post Velcade and dexamethasone from 03/18/2015 through 07/28/2015, Velcade discontinued secondary to neuropathy - Revlimid and dexamethasone from February 2017 through November 2017, achieving negative SPEP and immunofixation -Seen by Dr. Norma Fredrickson for second opinion, bone marrow biopsy on 11/14/2016 shows less than 2% plasma cells, maintenance Velcade started in May 2018, held in January 2019 due to diarrhea -Noted to have biochemical relapse with M spike of 0.5, recommended to start on reduced dose pomalidomide, Ninlaro and dexamethasone -Started first cycle of Pomalyst 2 mg (days 1-21 every 28 days), ixazomib 2.3 mg (days 1, 8, 15 every 28 days), and Dexamethasone 10 mg (weekly) on 08/24/2017 due to biochemical relapse.  She has tolerated it reasonably well although she felt somewhat tired.  She felt severely tired after cycle 2.  Ninlaro was held during third cycle due to excessive tiredness.  Cycle 4 of pomalidomide and dexamethasone started on 11/26/2017.  She is tolerating it very well without any side effects.  Her M spike on 11/23/2017 has come down to 0.2 from 0.4 on 10/29/2017.  Light chain ratio is stable at 2.4.  Kappa light chains have come down from 83-46. -Ninlaro 2.3 mg weekly was started back on 04/15/2018.  Pomalidomide dose is 2 mg, 3 weeks on 1 week off.  Dexamethasone is 10 mg every other week. -She was evaluated by Dr. Norma Fredrickson on 06/10/2018.  Myeloma blood work was stable. -We discussed her myeloma blood work from 11/13/2018.  Kappa light chains has increased to 129.5.  Free light chain ratio was 3.94, previously 2.08.  M spike was 0.3 g, previously 0.2 g on 10/02/2018. - Hence I have recommended increasing dexamethasone to 10 mg every week.  She has mild  swelling in her legs towards the end of the day.  She will use Lasix as needed.  2.  Bone protection: -We will continue Zometa 3 mg every 3 months. -She will continue calcium twice daily.  3.  Peripheral neuropathy: -She has some numbness in the feet which is stable.  Is from prior Velcade.   4.  Normocytic anemia: -This is from combination of CKD and iron deficiency. - Last Feraheme was on 10/09/2018 and 10/16/2018. -She is receiving Aranesp 300 mcg every 2 weeks.

## 2018-12-05 ENCOUNTER — Other Ambulatory Visit (HOSPITAL_COMMUNITY): Payer: Self-pay | Admitting: *Deleted

## 2018-12-05 DIAGNOSIS — C9000 Multiple myeloma not having achieved remission: Secondary | ICD-10-CM

## 2018-12-05 MED ORDER — POMALIDOMIDE 2 MG PO CAPS: 2.0000 mg | ORAL_CAPSULE | Freq: Every day | ORAL | 0 refills | Status: DC

## 2018-12-05 NOTE — Telephone Encounter (Signed)
Chart reviewed, pomalyst refilled.  

## 2018-12-09 ENCOUNTER — Other Ambulatory Visit: Payer: Self-pay | Admitting: Cardiology

## 2018-12-09 ENCOUNTER — Ambulatory Visit (INDEPENDENT_AMBULATORY_CARE_PROVIDER_SITE_OTHER): Payer: Medicare Other | Admitting: Otolaryngology

## 2018-12-10 ENCOUNTER — Other Ambulatory Visit (HOSPITAL_COMMUNITY): Payer: Self-pay | Admitting: *Deleted

## 2018-12-10 DIAGNOSIS — C9000 Multiple myeloma not having achieved remission: Secondary | ICD-10-CM

## 2018-12-11 ENCOUNTER — Encounter (HOSPITAL_COMMUNITY): Payer: Self-pay

## 2018-12-11 ENCOUNTER — Inpatient Hospital Stay (HOSPITAL_COMMUNITY): Payer: Medicare Other

## 2018-12-11 ENCOUNTER — Inpatient Hospital Stay (HOSPITAL_COMMUNITY): Payer: Medicare Other | Attending: Hematology

## 2018-12-11 ENCOUNTER — Other Ambulatory Visit: Payer: Self-pay

## 2018-12-11 VITALS — BP 123/53 | HR 79 | Temp 97.7°F | Resp 18

## 2018-12-11 DIAGNOSIS — D801 Nonfamilial hypogammaglobulinemia: Secondary | ICD-10-CM | POA: Insufficient documentation

## 2018-12-11 DIAGNOSIS — Z7982 Long term (current) use of aspirin: Secondary | ICD-10-CM | POA: Insufficient documentation

## 2018-12-11 DIAGNOSIS — Z87891 Personal history of nicotine dependence: Secondary | ICD-10-CM | POA: Insufficient documentation

## 2018-12-11 DIAGNOSIS — N183 Chronic kidney disease, stage 3 (moderate): Secondary | ICD-10-CM | POA: Insufficient documentation

## 2018-12-11 DIAGNOSIS — Z79899 Other long term (current) drug therapy: Secondary | ICD-10-CM | POA: Insufficient documentation

## 2018-12-11 DIAGNOSIS — J449 Chronic obstructive pulmonary disease, unspecified: Secondary | ICD-10-CM | POA: Insufficient documentation

## 2018-12-11 DIAGNOSIS — I10 Essential (primary) hypertension: Secondary | ICD-10-CM | POA: Insufficient documentation

## 2018-12-11 DIAGNOSIS — C9000 Multiple myeloma not having achieved remission: Secondary | ICD-10-CM | POA: Insufficient documentation

## 2018-12-11 DIAGNOSIS — Z7951 Long term (current) use of inhaled steroids: Secondary | ICD-10-CM | POA: Diagnosis not present

## 2018-12-11 DIAGNOSIS — E785 Hyperlipidemia, unspecified: Secondary | ICD-10-CM | POA: Insufficient documentation

## 2018-12-11 DIAGNOSIS — E538 Deficiency of other specified B group vitamins: Secondary | ICD-10-CM

## 2018-12-11 LAB — CBC WITH DIFFERENTIAL/PLATELET
Abs Immature Granulocytes: 0.07 10*3/uL (ref 0.00–0.07)
Basophils Absolute: 0.1 10*3/uL (ref 0.0–0.1)
Basophils Relative: 1 %
Eosinophils Absolute: 0.4 10*3/uL (ref 0.0–0.5)
Eosinophils Relative: 4 %
HCT: 37.5 % (ref 36.0–46.0)
Hemoglobin: 11.8 g/dL — ABNORMAL LOW (ref 12.0–15.0)
Immature Granulocytes: 1 %
Lymphocytes Relative: 13 %
Lymphs Abs: 1.4 10*3/uL (ref 0.7–4.0)
MCH: 32.7 pg (ref 26.0–34.0)
MCHC: 31.5 g/dL (ref 30.0–36.0)
MCV: 103.9 fL — ABNORMAL HIGH (ref 80.0–100.0)
Monocytes Absolute: 2 10*3/uL — ABNORMAL HIGH (ref 0.1–1.0)
Monocytes Relative: 18 %
Neutro Abs: 7 10*3/uL (ref 1.7–7.7)
Neutrophils Relative %: 63 %
Platelets: 216 10*3/uL (ref 150–400)
RBC: 3.61 MIL/uL — ABNORMAL LOW (ref 3.87–5.11)
RDW: 17 % — ABNORMAL HIGH (ref 11.5–15.5)
WBC: 10.9 10*3/uL — ABNORMAL HIGH (ref 4.0–10.5)
nRBC: 0 % (ref 0.0–0.2)

## 2018-12-11 LAB — COMPREHENSIVE METABOLIC PANEL
ALT: 19 U/L (ref 0–44)
AST: 19 U/L (ref 15–41)
Albumin: 3.1 g/dL — ABNORMAL LOW (ref 3.5–5.0)
Alkaline Phosphatase: 90 U/L (ref 38–126)
Anion gap: 10 (ref 5–15)
BUN: 33 mg/dL — ABNORMAL HIGH (ref 8–23)
CO2: 23 mmol/L (ref 22–32)
Calcium: 8.8 mg/dL — ABNORMAL LOW (ref 8.9–10.3)
Chloride: 107 mmol/L (ref 98–111)
Creatinine, Ser: 1.38 mg/dL — ABNORMAL HIGH (ref 0.44–1.00)
GFR calc Af Amer: 42 mL/min — ABNORMAL LOW (ref >60)
GFR calc non Af Amer: 37 mL/min — ABNORMAL LOW (ref >60)
Glucose, Bld: 117 mg/dL — ABNORMAL HIGH (ref 70–99)
Potassium: 3.7 mmol/L (ref 3.5–5.1)
Sodium: 140 mmol/L (ref 135–145)
Total Bilirubin: 0.6 mg/dL (ref 0.3–1.2)
Total Protein: 6.2 g/dL — ABNORMAL LOW (ref 6.5–8.1)

## 2018-12-11 LAB — MAGNESIUM: Magnesium: 2 mg/dL (ref 1.7–2.4)

## 2018-12-11 LAB — LACTATE DEHYDROGENASE: LDH: 161 U/L (ref 98–192)

## 2018-12-11 MED ORDER — CYANOCOBALAMIN 1000 MCG/ML IJ SOLN
1000.0000 ug | Freq: Once | INTRAMUSCULAR | Status: AC
  Administered 2018-12-11: 1000 ug via INTRAMUSCULAR

## 2018-12-11 MED ORDER — CYANOCOBALAMIN 1000 MCG/ML IJ SOLN
INTRAMUSCULAR | Status: AC
  Filled 2018-12-11: qty 1

## 2018-12-11 NOTE — Progress Notes (Signed)
Patient tolerated injection with no complaints voiced.  Site clean and dry with no bruising or swelling noted at site.  Band aid applied.  Vss with discharge and left ambulatory with no s/s of distress noted.  

## 2018-12-12 ENCOUNTER — Ambulatory Visit (HOSPITAL_COMMUNITY): Payer: Medicare Other

## 2018-12-12 ENCOUNTER — Ambulatory Visit (HOSPITAL_COMMUNITY): Payer: Medicare Other | Admitting: Hematology

## 2018-12-12 LAB — PROTEIN ELECTROPHORESIS, SERUM
A/G Ratio: 1.1 (ref 0.7–1.7)
Albumin ELP: 3 g/dL (ref 2.9–4.4)
Alpha-1-Globulin: 0.3 g/dL (ref 0.0–0.4)
Alpha-2-Globulin: 0.9 g/dL (ref 0.4–1.0)
Beta Globulin: 0.8 g/dL (ref 0.7–1.3)
Gamma Globulin: 0.7 g/dL (ref 0.4–1.8)
Globulin, Total: 2.7 g/dL (ref 2.2–3.9)
M-Spike, %: 0.3 g/dL — ABNORMAL HIGH
Total Protein ELP: 5.7 g/dL — ABNORMAL LOW (ref 6.0–8.5)

## 2018-12-12 LAB — KAPPA/LAMBDA LIGHT CHAINS
Kappa free light chain: 115 mg/L — ABNORMAL HIGH (ref 3.3–19.4)
Kappa, lambda light chain ratio: 3.82 — ABNORMAL HIGH (ref 0.26–1.65)
Lambda free light chains: 30.1 mg/L — ABNORMAL HIGH (ref 5.7–26.3)

## 2018-12-23 ENCOUNTER — Other Ambulatory Visit (HOSPITAL_COMMUNITY): Payer: Self-pay | Admitting: Hematology

## 2018-12-23 DIAGNOSIS — C9001 Multiple myeloma in remission: Secondary | ICD-10-CM

## 2018-12-25 ENCOUNTER — Ambulatory Visit (HOSPITAL_COMMUNITY): Payer: Medicare Other

## 2018-12-25 ENCOUNTER — Other Ambulatory Visit (HOSPITAL_COMMUNITY): Payer: Medicare Other

## 2018-12-25 ENCOUNTER — Ambulatory Visit (HOSPITAL_COMMUNITY): Payer: Medicare Other | Admitting: Hematology

## 2018-12-25 ENCOUNTER — Other Ambulatory Visit (HOSPITAL_COMMUNITY): Payer: Self-pay | Admitting: *Deleted

## 2018-12-25 DIAGNOSIS — N183 Chronic kidney disease, stage 3 unspecified: Secondary | ICD-10-CM

## 2018-12-25 DIAGNOSIS — C9000 Multiple myeloma not having achieved remission: Secondary | ICD-10-CM

## 2018-12-25 DIAGNOSIS — E538 Deficiency of other specified B group vitamins: Secondary | ICD-10-CM

## 2018-12-25 DIAGNOSIS — D631 Anemia in chronic kidney disease: Secondary | ICD-10-CM

## 2018-12-26 ENCOUNTER — Inpatient Hospital Stay (HOSPITAL_COMMUNITY): Payer: Medicare Other

## 2018-12-26 ENCOUNTER — Other Ambulatory Visit: Payer: Self-pay

## 2018-12-26 ENCOUNTER — Inpatient Hospital Stay (HOSPITAL_BASED_OUTPATIENT_CLINIC_OR_DEPARTMENT_OTHER): Payer: Medicare Other | Admitting: Hematology

## 2018-12-26 VITALS — BP 109/49 | HR 70 | Temp 98.1°F | Resp 16

## 2018-12-26 DIAGNOSIS — E538 Deficiency of other specified B group vitamins: Secondary | ICD-10-CM

## 2018-12-26 DIAGNOSIS — Z7982 Long term (current) use of aspirin: Secondary | ICD-10-CM

## 2018-12-26 DIAGNOSIS — C9000 Multiple myeloma not having achieved remission: Secondary | ICD-10-CM

## 2018-12-26 DIAGNOSIS — Z7951 Long term (current) use of inhaled steroids: Secondary | ICD-10-CM

## 2018-12-26 DIAGNOSIS — D801 Nonfamilial hypogammaglobulinemia: Secondary | ICD-10-CM

## 2018-12-26 DIAGNOSIS — Z87891 Personal history of nicotine dependence: Secondary | ICD-10-CM | POA: Diagnosis not present

## 2018-12-26 DIAGNOSIS — E785 Hyperlipidemia, unspecified: Secondary | ICD-10-CM

## 2018-12-26 DIAGNOSIS — Z79899 Other long term (current) drug therapy: Secondary | ICD-10-CM

## 2018-12-26 DIAGNOSIS — J449 Chronic obstructive pulmonary disease, unspecified: Secondary | ICD-10-CM

## 2018-12-26 DIAGNOSIS — D631 Anemia in chronic kidney disease: Secondary | ICD-10-CM

## 2018-12-26 DIAGNOSIS — N183 Chronic kidney disease, stage 3 unspecified: Secondary | ICD-10-CM

## 2018-12-26 DIAGNOSIS — I10 Essential (primary) hypertension: Secondary | ICD-10-CM

## 2018-12-26 DIAGNOSIS — D509 Iron deficiency anemia, unspecified: Secondary | ICD-10-CM

## 2018-12-26 LAB — CBC
HCT: 35.3 % — ABNORMAL LOW (ref 36.0–46.0)
Hemoglobin: 11.3 g/dL — ABNORMAL LOW (ref 12.0–15.0)
MCH: 32.9 pg (ref 26.0–34.0)
MCHC: 32 g/dL (ref 30.0–36.0)
MCV: 102.9 fL — ABNORMAL HIGH (ref 80.0–100.0)
Platelets: 257 10*3/uL (ref 150–400)
RBC: 3.43 MIL/uL — ABNORMAL LOW (ref 3.87–5.11)
RDW: 16.3 % — ABNORMAL HIGH (ref 11.5–15.5)
WBC: 7.2 10*3/uL (ref 4.0–10.5)
nRBC: 0 % (ref 0.0–0.2)

## 2018-12-26 LAB — COMPREHENSIVE METABOLIC PANEL
ALT: 16 U/L (ref 0–44)
AST: 24 U/L (ref 15–41)
Albumin: 3.5 g/dL (ref 3.5–5.0)
Alkaline Phosphatase: 78 U/L (ref 38–126)
Anion gap: 14 (ref 5–15)
BUN: 22 mg/dL (ref 8–23)
CO2: 21 mmol/L — ABNORMAL LOW (ref 22–32)
Calcium: 9.5 mg/dL (ref 8.9–10.3)
Chloride: 104 mmol/L (ref 98–111)
Creatinine, Ser: 1.4 mg/dL — ABNORMAL HIGH (ref 0.44–1.00)
GFR calc Af Amer: 42 mL/min — ABNORMAL LOW (ref >60)
GFR calc non Af Amer: 36 mL/min — ABNORMAL LOW (ref >60)
Glucose, Bld: 157 mg/dL — ABNORMAL HIGH (ref 70–99)
Potassium: 4.3 mmol/L (ref 3.5–5.1)
Sodium: 139 mmol/L (ref 135–145)
Total Bilirubin: 0.5 mg/dL (ref 0.3–1.2)
Total Protein: 6.9 g/dL (ref 6.5–8.1)

## 2018-12-26 MED ORDER — ZOLEDRONIC ACID 4 MG/5ML IV CONC
3.0000 mg | Freq: Once | INTRAVENOUS | Status: AC
  Administered 2018-12-26: 15:00:00 3 mg via INTRAVENOUS
  Filled 2018-12-26: qty 3.75

## 2018-12-26 MED ORDER — SODIUM CHLORIDE 0.9 % IV SOLN
Freq: Once | INTRAVENOUS | Status: AC
  Administered 2018-12-26: 14:00:00 via INTRAVENOUS

## 2018-12-26 NOTE — Patient Instructions (Signed)
[place] at [facility] Discharge Instructions  Received Zometa infusion today. Follow-up as scheduled. Call clinic for any questions or concerns   Thank you for choosing [place] at [facility] to provide your oncology and hematology care.  To afford each patient quality time with our provider, please arrive at least 15 minutes before your scheduled appointment time.   If you have a lab appointment with the [facility] please come in thru the  Main Entrance and check in at the main information desk  You need to re-schedule your appointment should you arrive 10 or more minutes late.  We strive to give you quality time with our providers, and arriving late affects you and other patients whose appointments are after yours.  Also, if you no show three or more times for appointments you may be dismissed from the clinic at the providers discretion.     Again, thank you for choosing [facility].  Our hope is that these requests will decrease the amount of time that you wait before being seen by our physicians.       _____________________________________________________________  Should you have questions after your visit to [facility], please contact our office at [phone] between the hours of 8:00 a.m. and 4:30 p.m.  Voicemails left after 4:00 p.m. will not be returned until the following business day.  For prescription refill requests, have your pharmacy contact our office and allow 72 hours.    Cancer Center Support Programs:   > Cancer Support Group  2nd Tuesday of the month 1pm-2pm, Journey Room

## 2018-12-26 NOTE — Progress Notes (Signed)
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    03/18/2015 - 07/28/2015 Chemotherapy   Velcade 1.6 mg/m2 discontinued secondary to intolerance   07/28/2015 Adverse Reaction   stool incontinence, weakness, collapse upon standing, felt to be secondary to velcade   11/09/2015 - 12/13/2015 Chemotherapy   cytoxan IV 300 mg/m2 administered X 2 doses only in addition to rev/dex   11/09/2015 - 06/08/2016 Chemotherapy   Revlimid/Dexamethasone    02/05/2017 - 07/09/2017 Chemotherapy   The patient had bortezomib SQ (VELCADE) chemo injection 1.75 mg, 1 mg/m2 = 1.75 mg (66.7 % of original dose 1.5 mg/m2), Subcutaneous,  Once, 1 of 8 cycles Dose modification: 1.5 mg/m2 (original dose 1.5 mg/m2, Cycle 1, Reason: Provider Judgment, Comment: Per Dr. Norma Fredrickson recommendations from [facility] 1 mg/m2 (original dose 1.5 mg/m2, Cycle 1, Reason: Provider Judgment)  for chemotherapy treatment.     07/09/2017 Adverse Reaction   Diarrhea    Chemotherapy   Pomalyst 2 mg (Days 1-21 every 28 days), Ixazomib 2.3 mg (days 1, 8, 15 every 28 days), and Dexamethasone 10 mg (weekly)- Rx's printed on 08/24/2017.  Treatment recommendations from Dr. Norma Fredrickson at [facility].        INTERVAL HISTORY:  Ms. Pruden 78 y.o. female presents today for follow-up.  Reports overall doing well.  She denies any significant fatigue.  Denies any episodes of bleeding.  Denies any constipation or diarrhea.  Denies any nausea or vomiting.  Denies any  peripheral neuropathies.  She is currently on Ninlaro, Pomalyst, dexamethasone, tolerating well.  Denies any new bony pains.  Reports appetite is stable.  No change in bowel habits.   REVIEW OF SYSTEMS:  Review of Systems  Constitutional: Negative.   HENT:  Negative.   Eyes: Negative.   Respiratory: Negative.   Cardiovascular: Negative.   Gastrointestinal: Negative.   Endocrine: Negative.   Genitourinary: Negative.    Musculoskeletal: Negative.   Skin: Negative.   Neurological: Negative.   Hematological: Negative.   Psychiatric/Behavioral: Negative.      PAST MEDICAL/SURGICAL HISTORY:  Past Medical History:  Diagnosis Date  . 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 CAD (coronary artery disease)   . COPD (chronic obstructive pulmonary disease) ([place])   . History of tobacco abuse   . Hyperlipidemia   . Hypertension   . 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 Multiple myeloma ([place])   . Vitamin B12 deficiency 04/15/2016   Overview:  Vitamin B12 level documented 155, January 2016 with the normal range being 211-924   Past Surgical History:  Procedure Laterality Date  . BACK SURGERY    . CARDIAC CATHETERIZATION  04/2011   right and left cath showing normal right heart pressures,but newly diagnosed coronary artery disease/drug eluting stent placed to RCA with residual disease in the proximal RCA and LAD and ramus, normal LV function and 60-65% EF  . COLONOSCOPY N/A 09/30/2014   Procedure: COLONOSCOPY;  Surgeon: Rogene Houston, MD;  [facility];  Service: Endoscopy;  Laterality: N/A;  225  . CORONARY ANGIOPLASTY WITH STENT PLACEMENT  04/2011   mid RCA: 3.0 X38 mm Promus DES. Residual 40% disease proximally  . NECK SURGERY       SOCIAL HISTORY:  Social History   Socioeconomic History  . Marital status: Widowed    Spouse name: Not on file  . Number of children: Not on file  . Years of education: Not on file  . Highest education  level: Not on file  Occupational History  . 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 Financial resource strain: Not on file  . Food insecurity    Worry: Not on file    Inability: Not on file  . Transportation needs    Medical: Not on file    Non-medical: Not on file  Tobacco Use  . Smoking status: Former Smoker    Packs/day: 3.00    Years: 25.00    Pack years: 75.00    Types: Cigarettes    Quit date: 07/10/1994    Years since quitting: 24.4  . Smokeless tobacco: Never Used  Substance and Sexual Activity  . Alcohol use: No  . Drug use: Never  . Sexual activity: Not on file  Lifestyle  . Physical activity    Days per week: Not on file    Minutes per session: Not on file  . Stress: Not on file  Relationships  . Social Herbalist on phone: Not on file    Gets together: Not on file    Attends religious service: Not on file    Active member of club or organization: Not on file    Attends meetings of clubs or organizations: Not on file    Relationship status: Not on file  . Intimate partner violence    Fear of current or ex partner: Not on file    Emotionally abused: Not on file    Physically abused: Not on file    Forced sexual activity: Not on file  Other Topics Concern  . Not on file  Social History Narrative  . Not on file    FAMILY HISTORY:  Family History  Problem Relation Age of Onset  . Heart failure Mother   . Cancer Father     CURRENT MEDICATIONS:  Outpatient Encounter Medications as of 12/26/2018  Medication Sig Note  . acyclovir (ZOVIRAX) 200 MG capsule TAKE ONE CAPSULE BY MOUTH TWICE DAILY   . aspirin EC 81 MG tablet Take 1 tablet (81 mg total) by mouth daily.   Marland Kitchen atorvastatin (LIPITOR) 10 MG tablet TAKE ONE TABLET BY MOUTH DAILY   . azithromycin (ZITHROMAX) 250 MG tablet Take two tablets on the first day and then one tablet every day after.   . Calcium Carbonate-Vit D-Min (CALCIUM 600+D PLUS MINERALS) 600-400  MG-UNIT CHEW Chew 1 tablet by mouth 3 (three) times daily.    . cyanocobalamin (,VITAMIN B-12,) 1000 MCG/ML injection Inject 1,000 mcg as directed every 30 (thirty) days.   Marland Kitchen dexamethasone (DECADRON) 2 MG tablet Take five tablets ('10mg'$ ) by mouth weekly. (Patient taking differently: Take five tablets ('10mg'$ ) by mouth bi-weekly.) 12/26/2018: [facility] instructed patient to take medication weekly  . diazepam (VALIUM) 5 MG tablet TAKE ONE TABLET BY MOUTH EVERY 6 HOURS AS NEEDED FOR ANXIETY   . diltiazem (CARDIZEM CD) 240 MG 24 hr capsule TAKE ONE CAPSULE BY MOUTH DAILY   . fluticasone (FLONASE) 50 MCG/ACT nasal spray Place into the nose.   Marland Kitchen  furosemide (LASIX) 20 MG tablet Take 1 tablet (20 mg total) by mouth daily as needed (as needed for ankle swelling).   Marland Kitchen guaiFENesin (MUCINEX) 600 MG 12 hr tablet Take 600 mg 2 (two) times daily as needed by mouth for cough or to loosen phlegm.   Marland Kitchen losartan (COZAAR) 50 MG tablet TAKE ONE TABLET BY MOUTH DAILY   . magnesium oxide (MAG-OX) 400 MG tablet TAKE ONE TABLET BY MOUTH TWICE DAILY. WHEN TAKING LASIX   . metoprolol tartrate (LOPRESSOR) 25 MG tablet TAKE ONE TABLET BY MOUTH TWICE DAILY   . NINLARO 2.3 MG capsule    . ondansetron (ZOFRAN) 8 MG tablet Take 1 tablet (8 mg total) by mouth every 8 (eight) hours as needed for nausea or vomiting.   . pantoprazole (PROTONIX) 40 MG tablet    . pomalidomide (POMALYST) 2 MG capsule Take 1 capsule (2 mg total) by mouth daily. Take with water on days 1-21. Repeat every 28 days.   . potassium chloride (K-DUR,KLOR-CON) 10 MEQ tablet Take 1 tablet (10 mEq total) by mouth 3 (three) times daily. (Patient taking differently: Take 10 mEq by mouth once. )   . umeclidinium-vilanterol (ANORO ELLIPTA) 62.5-25 MCG/INH AEPB Inhale into the lungs.    No facility-administered encounter medications on file as of 12/26/2018.     ALLERGIES:  Allergies  Allergen Reactions  . Lisinopril Cough  . Plavix [Clopidogrel Bisulfate] Swelling      PHYSICAL EXAM:  ECOG Performance status: 1  Vitals:   12/26/18 1327  BP: (!) 142/59  Pulse: 92  Resp: 18  Temp: 98 F (36.7 C)  SpO2: 96%   Filed Weights   12/26/18 1327  Weight: 165 lb 14.4 oz (75.3 kg)    Physical Exam Constitutional:      Appearance: Normal appearance.  HENT:     Head: Normocephalic.     Nose: Nose normal.     Mouth/Throat:     Mouth: Mucous membranes are moist.     Pharynx: Oropharynx is clear.  Eyes:     Extraocular Movements: Extraocular movements intact.     Conjunctiva/sclera: Conjunctivae normal.  Neck:     Musculoskeletal: Normal range of motion.  Cardiovascular:     Rate and Rhythm: Normal rate and regular rhythm.     Pulses: Normal pulses.  Pulmonary:     Effort: Pulmonary effort is normal.     Breath sounds: Normal breath sounds.  Abdominal:     General: Bowel sounds are normal.     Palpations: Abdomen is soft.  Musculoskeletal: Normal range of motion.  Skin:    General: Skin is warm and dry.  Neurological:     General: No focal deficit present.     Mental Status: She is alert and oriented to person, place, and time.  Psychiatric:        Mood and Affect: Mood normal.        Behavior: Behavior normal.        Thought Content: Thought content normal.        Judgment: Judgment normal.      LABORATORY DATA:  I have reviewed the labs as listed.  CBC    Component Value Date/Time   WBC 7.2 12/26/2018 1240   RBC 3.43 (L) 12/26/2018 1240   HGB 11.3 (L) 12/26/2018 1240   HCT 35.3 (L) 12/26/2018 1240   PLT 257 12/26/2018 1240   MCV 102.9 (H) 12/26/2018 1240   MCH 32.9 12/26/2018 1240   MCHC 32.0 12/26/2018  1240   RDW 16.3 (H) 12/26/2018 1240   LYMPHSABS 1.4 12/11/2018 0839   MONOABS 2.0 (H) 12/11/2018 0839   EOSABS 0.4 12/11/2018 0839   BASOSABS 0.1 12/11/2018 0839   CMP Latest Ref Rng & Units 12/26/2018 12/11/2018 11/27/2018  Glucose 70 - 99 mg/dL 157(H) 117(H) 100(H)  BUN 8 - 23 mg/dL 22 33(H) 23  Creatinine 0.44 - 1.00  mg/dL 1.40(H) 1.38(H) 1.24(H)  Sodium 135 - 145 mmol/L 139 140 142  Potassium 3.5 - 5.1 mmol/L 4.3 3.7 4.1  Chloride 98 - 111 mmol/L 104 107 109  CO2 22 - 32 mmol/L 21(L) 23 24  Calcium 8.9 - 10.3 mg/dL 9.5 8.8(L) 8.7(L)  Total Protein 6.5 - 8.1 g/dL 6.9 6.2(L) -  Total Bilirubin 0.3 - 1.2 mg/dL 0.5 0.6 -  Alkaline Phos 38 - 126 U/L 78 90 -  AST 15 - 41 U/L 24 19 -  ALT 0 - 44 U/L 16 19 -       DIAGNOSTIC IMAGING:  I have independently reviewed the scans and discussed with the patient.   I have reviewed Francene Finders, NP's note and agree with the documentation.  I personally performed a face-to-face visit, made revisions and my assessment and plan is as follows.    ASSESSMENT & PLAN:   No problem-specific Assessment & Plan notes found for this encounter.      Orders placed this encounter:  No orders of the defined types were placed in this encounter.     Derek Jack, MD [place] [phone]

## 2018-12-26 NOTE — Progress Notes (Signed)
[place] reviewed with and pt seen by Reynolds Bowl NP and pt approved for Zometa infusion today per NP. Hgb 11.3 today so Aranesp injection to be held due to parameters.Calcium 9.5 and pt denied any tooth or jaw pain and no recent or future dental visits prior to administering Zometa.Pt continues to take her Calcium PO as prescribed. Peripheral IV site checked with positive blood return noted prior to and after infusion. VSS upon discharge. Pt discharged self ambulatory in satisfactory condition

## 2018-12-26 NOTE — Assessment & Plan Note (Signed)
1. 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 gain, -13 -Status post Velcade and dexamethasone from 03/18/2015 through 07/28/2015, Velcade discontinued secondary to neuropathy - Revlimid and dexamethasone from February 2017 through November 2017, achieving negative SPEP and immunofixation -Seen by Dr. Rodriguez for second opinion, bone marrow biopsy on 11/14/2016 shows less than 2% plasma cells, maintenance Velcade started in May 2018, held in January 2019 due to diarrhea -Noted to have biochemical relapse with M spike of 0.5, recommended to start on reduced dose pomalidomide, Ninlaro and dexamethasone -Started first cycle of Pomalyst 2 mg (days 1-21 every 28 days), ixazomib 2.3 mg (days 1, 8, 15 every 28 days), and Dexamethasone 10 mg (weekly) on 08/24/2017 due to biochemical relapse.  She has tolerated it reasonably well although she felt somewhat tired.  She felt severely tired after cycle 2.  Ninlaro was held during third cycle due to excessive tiredness.  Cycle 4 of pomalidomide and dexamethasone started on 11/26/2017.  She is tolerating it very well without any side effects.  Her M spike on 11/23/2017 has come down to 0.2 from 0.4 on 10/29/2017.  Light chain ratio is stable at 2.4.  Kappa light chains have come down from 83-46. -Ninlaro 2.3 mg weekly was started back on 04/15/2018.  Pomalidomide dose is 2 mg, 3 weeks on 1 week off.  Dexamethasone is 10 mg every other week. -She was evaluated by Dr. Rodriguez on 06/10/2018.  Myeloma blood work was stable. -We discussed her myeloma blood work from 11/13/2018.  Kappa light chains has increased to 129.5.  Free light chain ratio was 3.94, previously 2.08.  M spike was 0.3 g, previously 0.2 g on 10/02/2018. - Recommended increasing dexamethasone to 10 mg every week.  She has mild swelling in her  legs towards the end of the day.  She will use Lasix as needed. -Multiple myeloma labs from June 3 reveal stable M spike of 0.3, kappa light chains are stable at 115.0, lambda 30.1, and ratio 3.82.  We will continue with current treatment, Ninlaro 2.3 mg weekly, pomalidomide 2 mg 3 weeks on 1 week off, dexamethasone 10 mg weekly.  We will continue to monitor multiple myeloma labs closely. -Return to clinic in 1 month.  2.  Bone protection: -We will continue Zometa 3 mg every 3 months. -She will continue calcium twice daily.  3.  Peripheral neuropathy: -She has some numbness in the feet which is stable.  Is from prior Velcade.   4.  Normocytic anemia: -This is from combination of CKD and iron deficiency. - Last Feraheme was on 10/09/2018 and 10/16/2018. -She is receiving Aranesp 300 mcg every 2 weeks.     

## 2018-12-31 ENCOUNTER — Ambulatory Visit (INDEPENDENT_AMBULATORY_CARE_PROVIDER_SITE_OTHER): Payer: Medicare Other | Admitting: Adult Health

## 2018-12-31 ENCOUNTER — Telehealth (HOSPITAL_COMMUNITY): Payer: Self-pay | Admitting: Hematology

## 2018-12-31 ENCOUNTER — Encounter: Payer: Self-pay | Admitting: Adult Health

## 2018-12-31 ENCOUNTER — Other Ambulatory Visit: Payer: Self-pay

## 2018-12-31 ENCOUNTER — Other Ambulatory Visit (HOSPITAL_COMMUNITY): Payer: Self-pay | Admitting: *Deleted

## 2018-12-31 VITALS — BP 140/68 | HR 69 | Temp 97.2°F | Ht 67.0 in | Wt 166.4 lb

## 2018-12-31 DIAGNOSIS — I25119 Atherosclerotic heart disease of native coronary artery with unspecified angina pectoris: Secondary | ICD-10-CM

## 2018-12-31 DIAGNOSIS — R609 Edema, unspecified: Secondary | ICD-10-CM

## 2018-12-31 DIAGNOSIS — R600 Localized edema: Secondary | ICD-10-CM

## 2018-12-31 DIAGNOSIS — I10 Essential (primary) hypertension: Secondary | ICD-10-CM | POA: Diagnosis not present

## 2018-12-31 DIAGNOSIS — C9000 Multiple myeloma not having achieved remission: Secondary | ICD-10-CM

## 2018-12-31 MED ORDER — POMALIDOMIDE 2 MG PO CAPS: 2.0000 mg | ORAL_CAPSULE | Freq: Every day | ORAL | 0 refills | Status: DC

## 2018-12-31 MED ORDER — FUROSEMIDE 20 MG PO TABS: 10.0000 mg | ORAL_TABLET | Freq: Every day | ORAL | 0 refills | Status: DC | PRN

## 2018-12-31 NOTE — Telephone Encounter (Signed)
Pc to [facility] to question why another request for Pomalyst was sent to [place] since the pt just had a fill on 12/23/2018.  Per csr was sent in error. Asked to disregard the request

## 2018-12-31 NOTE — Progress Notes (Signed)
Cardiology Office Note   Date:  12/31/2018   ID:  DEBBE CRUMBLE, DOB Feb 16, 1941, MRN [id number]  PCP:  [name], MD  Cardiologist: Dr. Martinique CC: LEE with DOE    History of Present Illness: Carol Bradley is a 78 y.o. female who presents for ongoing assessment and management of coronary artery disease status post stenting of the mid to distal RCA in October 2012, labile hypertension, unable to tolerate ACE inhibitor due to cough, unable to tolerate HCTZ due to hyponatremia, other history includes COPD and mild pulmonary hypertension.  She has been treated for myeloma with maintenance chemo, has required periodic blood transfusions and iron infusions for anemia.  Echo and Myoview studies were unremarkable.  She was last seen by Dr. Martinique on 09/05/2018 and was doing well, she did have some intermittent swelling in her ankles but no fever.  She states she was seen by PCP who felt she had fluid in her lungs, with LEE. She is out of lasix which she takes prn for edema. She states that she still has DOE, especially when walking up stairs.   Past Medical History:  Diagnosis Date  . 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 CAD (coronary artery disease)   . COPD (chronic obstructive pulmonary disease) ([place])   . History of tobacco abuse   . Hyperlipidemia   . Hypertension   . 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 Multiple myeloma ([place])   . Vitamin B12 deficiency 04/15/2016   Overview:  Vitamin B12 level documented 155, January 2016 with the normal range being 211-924    Past Surgical History:  Procedure Laterality Date  . BACK SURGERY    . CARDIAC CATHETERIZATION  04/2011   right and left cath showing normal right heart pressures,but newly diagnosed coronary artery disease/drug eluting stent placed to RCA with residual disease in the proximal RCA and LAD and ramus, normal LV function and 60-65% EF  . COLONOSCOPY N/A 09/30/2014   Procedure: COLONOSCOPY;   Surgeon: Rogene Houston, MD;  [facility];  Service: Endoscopy;  Laterality: N/A;  225  . CORONARY ANGIOPLASTY WITH STENT PLACEMENT  04/2011   mid RCA: 3.0 X38 mm Promus DES. Residual 40% disease proximally  . NECK SURGERY       Current Outpatient Medications  Medication Sig Dispense Refill  . acyclovir (ZOVIRAX) 200 MG capsule TAKE ONE CAPSULE BY MOUTH TWICE DAILY 180 capsule 1  . aspirin EC 81 MG tablet Take 1 tablet (81 mg total) by mouth daily. 90 tablet 3  . atorvastatin (LIPITOR) 10 MG tablet TAKE ONE TABLET BY MOUTH DAILY 90 tablet 3  . azithromycin (ZITHROMAX) 250 MG tablet Take two tablets on the first day and then one tablet every day after.    . Calcium Carbonate-Vit D-Min (CALCIUM 600+D PLUS MINERALS) 600-400 MG-UNIT CHEW Chew 1 tablet by mouth 3 (three) times daily.     . cyanocobalamin (,VITAMIN B-12,) 1000 MCG/ML injection Inject 1,000 mcg as directed every 30 (thirty) days.    Marland Kitchen dexamethasone (DECADRON) 2 MG tablet Take five tablets (16m) by mouth weekly. (Patient taking differently: Take five tablets (158m by mouth bi-weekly.) 20 tablet 11  . diazepam (VALIUM) 5 MG tablet TAKE ONE TABLET BY MOUTH EVERY 6 HOURS AS NEEDED FOR ANXIETY 30 tablet 1  . diltiazem (CARDIZEM CD) 240 MG 24 hr capsule TAKE ONE CAPSULE BY MOUTH DAILY 30 capsule 11  . fluticasone (FLONASE) 50 MCG/ACT nasal spray Place  into the nose.    . furosemide (LASIX) 20 MG tablet Take 0.5 tablets (10 mg total) by mouth daily as needed (as needed for ankle swelling). 30 tablet 0  . guaiFENesin (MUCINEX) 600 MG 12 hr tablet Take 600 mg 2 (two) times daily as needed by mouth for cough or to loosen phlegm.    Marland Kitchen losartan (COZAAR) 50 MG tablet TAKE ONE TABLET BY MOUTH DAILY 90 tablet 3  . magnesium oxide (MAG-OX) 400 MG tablet TAKE ONE TABLET BY MOUTH TWICE DAILY. WHEN TAKING LASIX  1  . metoprolol tartrate (LOPRESSOR) 25 MG tablet TAKE ONE TABLET BY MOUTH TWICE DAILY 180 tablet 3  . NINLARO 2.3 MG capsule      . ondansetron (ZOFRAN) 8 MG tablet Take 1 tablet (8 mg total) by mouth every 8 (eight) hours as needed for nausea or vomiting. 30 tablet 2  . pantoprazole (PROTONIX) 40 MG tablet     . pomalidomide (POMALYST) 2 MG capsule Take 1 capsule (2 mg total) by mouth daily. Take with water on days 1-21. Repeat every 28 days. 21 capsule 0  . potassium chloride (K-DUR,KLOR-CON) 10 MEQ tablet Take 1 tablet (10 mEq total) by mouth 3 (three) times daily. (Patient taking differently: Take 10 mEq by mouth once. ) 90 tablet 4  . umeclidinium-vilanterol (ANORO ELLIPTA) 62.5-25 MCG/INH AEPB Inhale into the lungs.     No current facility-administered medications for this visit.     Allergies:   Lisinopril and Plavix [clopidogrel bisulfate]    Social History:  The patient  reports that she quit smoking about 24 years ago. Her smoking use included cigarettes. She has a 75.00 pack-year smoking history. She has never used smokeless tobacco. She reports that she does not drink alcohol or use drugs.   Family History:  The patient's family history includes Cancer in her father; Heart failure in her mother.    ROS: All other systems are reviewed and negative. Unless otherwise mentioned in H&P    PHYSICAL EXAM: VS:  BP 128/62   Pulse 69   Temp (!) 97.2 F (36.2 C)   Ht _0  (1.702 m)   Wt 166 lb 6.4 oz (75.5 kg)   BMI 26.06 kg/m  , BMI Body mass index is 26.06 kg/m. GEN: Well nourished, well developed, in no acute distress HEENT: normal Neck: no JVD, carotid bruits, or masses Cardiac: IRRR; no murmurs, rubs, or gallops, non-pitting edema of the ankles. Respiratory:  Clear to auscultation bilaterally, normal work of breathing GI: soft, nontender, nondistended, + BS MS: no deformity or atrophy Skin: warm and dry, no rash Neuro:  Strength and sensation are intact Psych: euthymic mood, full affect   EKG:  Not completed this office visit.   Recent Labs: 12/11/2018: Magnesium 2.0 12/26/2018: ALT 16; BUN  22; Creatinine, Ser 1.40; Hemoglobin 11.3; Platelets 257; Potassium 4.3; Sodium 139    Lipid Panel    Component Value Date/Time   CHOL 133 12/24/2017 1322   TRIG 74 12/24/2017 1322   HDL 45 12/24/2017 1322   CHOLHDL 3.0 12/24/2017 1322   VLDL 15 12/24/2017 1322   LDLCALC 73 12/24/2017 1322      Wt Readings from Last 3 Encounters:  12/31/18 166 lb 6.4 oz (75.5 kg)  12/26/18 165 lb 14.4 oz (75.3 kg)  11/27/18 167 lb (75.8 kg)      Other studies Reviewed: Echo: 6/20/19Study Conclusions  - Left ventricle: The cavity size was normal. Wall thickness was increased in a pattern  of moderate LVH. Systolic function was normal. The estimated ejection fraction was in the range of 60% to 65%. Wall motion was normal; there were no regional wall motion abnormalities. Doppler parameters are consistent with abnormal left ventricular relaxation (grade 1 diastolic dysfunction). - Aortic valve: Trileaflet; mildly thickened, mildly calcified leaflets. There was mild regurgitation. - Mitral valve: Calcified annulus. There was mild regurgitation. Valve area by pressure half-time: 1.62 cm^2. - Tricuspid valve: There was mild regurgitation. - Pulmonary arteries: Systolic pressure was mildly increased. PA peak pressure: 41 mm Hg (S).   Myoview 01/01/18:  Study Highlights     Nuclear stress EF: 72%. No wall motion abnormalities  No evidence of significant perfusion defects. No TID (transient ischemic dilatation). No infarct or ischemia identified. During Lexiscan infusion, diffuse mild, 1 mm ST segment depression noted.  This is a low risk study. There is not appear to be any ischemia identified. No ischemia in the previously placed RCA stent territory.     ASSESSMENT AND PLAN:  1.CAD: Hx of stent to the RCA in 2012. No recurrent chest pain. She is having increased problems with her breathing. I will repeat echocardiogram to evaluate for changes in her LV function  which may be contributing to her breathing decline. She does not have evidence of fluid overload I will give her lasix 20 mg tablet to take prn for edema. She is to be carefull not to take it everyday to avoid dehydration. She can take it up to once or twice a week only.  Can consider repeat NM study. Last one was a year ago and was negative for ischemia.   2. Hypertension: She is on diltiazem which is keeping BP controlled, but not optimal in CAD patients. She is intolerant to ACE causing coughing. May consider addition of hydralazine on next office visit if BP is persistently elevated. I explained to her that CCB can cause some dependent edema and not to overdue the lasix.   3. COPD: She will need to follow up with PCP or pulmonology for more recommendations for assistance with optimization of breathing.    Current medicines are reviewed at length with the patient today.    Labs/ tests ordered today include: Echocardiogram.  Carol Bradley. Carol Bradley, ANP, AACC   12/31/2018 1:06 PM    [facility] [place] [address] Office [phone] Fax [phone]

## 2018-12-31 NOTE — Telephone Encounter (Signed)
Chart reviewed, pomalyst refilled per last office note.

## 2018-12-31 NOTE — Patient Instructions (Signed)
Medication Instructions:  TAKE LASIX 10MG  (1/2 TAB)AS NEEDED FOR SWELLING If you need a refill on your cardiac medications before your next appointment, please call your pharmacy.  Testing/Procedures: Echocardiogram - Your physician has requested that you have an echocardiogram. Echocardiography is a painless test that uses sound waves to create images of your heart. It provides your doctor with information about the size and shape of your heart and how well your heart's chambers and valves are working. This procedure takes approximately one hour. There are no restrictions for this procedure. This will be performed at our [facility] location - [address], [address].  Follow-Up: You will need a follow up appointment in ABOUT 1 MONTHE AFTER ECHO Peter Martinique, MD , Jory Sims, DNP, AACC or one of the following Advanced Practice Providers on your designated Care Team:  Almyra Deforest, PA-C  Fabian Sharp, PA-C       At [facility], you and your health needs are our priority.  As part of our continuing mission to provide you with exceptional heart care, we have created designated Provider Care Teams.  These Care Teams include your primary Cardiologist (physician) and Advanced Practice Providers (APPs -  Physician Assistants and Nurse Practitioners) who all work together to provide you with the care you need, when you need it.  Thank you for choosing [facility] at [facility]!!

## 2019-01-08 ENCOUNTER — Other Ambulatory Visit (HOSPITAL_COMMUNITY): Payer: Self-pay | Admitting: Hematology

## 2019-01-08 ENCOUNTER — Telehealth (HOSPITAL_COMMUNITY): Payer: Self-pay | Admitting: *Deleted

## 2019-01-08 DIAGNOSIS — C9000 Multiple myeloma not having achieved remission: Secondary | ICD-10-CM

## 2019-01-08 NOTE — Telephone Encounter (Signed)
Pt called into clinic requesting a chest x-ray to see if pneumonia was showing in her lungs. Dr. Norma Fredrickson from [facility] did a chest x-ray on patient in June which showed pneumonia and prescribed her antibiotics and told her to follow-up with her primary oncologist. Pt stated she still is having SOB. Spoke with provider about patient's request,provider spoke with scheduler and scheduler called pt to let her know when to come in for chest x-ray.

## 2019-01-09 ENCOUNTER — Other Ambulatory Visit: Payer: Self-pay

## 2019-01-09 ENCOUNTER — Ambulatory Visit (HOSPITAL_COMMUNITY)
Admission: RE | Admit: 2019-01-09 | Discharge: 2019-01-09 | Disposition: A | Discharge status: Home / Self Care | Payer: Medicare Other | Source: Ambulatory Visit | Attending: Hematology | Admitting: Hematology

## 2019-01-09 ENCOUNTER — Encounter (HOSPITAL_COMMUNITY): Payer: Self-pay

## 2019-01-09 ENCOUNTER — Ambulatory Visit (HOSPITAL_BASED_OUTPATIENT_CLINIC_OR_DEPARTMENT_OTHER): Payer: Medicare Other

## 2019-01-09 ENCOUNTER — Inpatient Hospital Stay (HOSPITAL_COMMUNITY): Payer: Medicare Other | Attending: Hematology

## 2019-01-09 VITALS — BP 121/58 | HR 71 | Temp 97.9°F | Resp 16

## 2019-01-09 DIAGNOSIS — I25119 Atherosclerotic heart disease of native coronary artery with unspecified angina pectoris: Secondary | ICD-10-CM

## 2019-01-09 DIAGNOSIS — E538 Deficiency of other specified B group vitamins: Secondary | ICD-10-CM | POA: Diagnosis not present

## 2019-01-09 DIAGNOSIS — I10 Essential (primary) hypertension: Secondary | ICD-10-CM

## 2019-01-09 DIAGNOSIS — R609 Edema, unspecified: Secondary | ICD-10-CM | POA: Diagnosis present

## 2019-01-09 DIAGNOSIS — C9000 Multiple myeloma not having achieved remission: Secondary | ICD-10-CM | POA: Diagnosis present

## 2019-01-09 DIAGNOSIS — D631 Anemia in chronic kidney disease: Secondary | ICD-10-CM | POA: Diagnosis present

## 2019-01-09 DIAGNOSIS — R600 Localized edema: Secondary | ICD-10-CM | POA: Insufficient documentation

## 2019-01-09 DIAGNOSIS — N183 Chronic kidney disease, stage 3 (moderate): Secondary | ICD-10-CM | POA: Diagnosis not present

## 2019-01-09 LAB — ECHOCARDIOGRAM COMPLETE

## 2019-01-09 MED ORDER — CYANOCOBALAMIN 1000 MCG/ML IJ SOLN
INTRAMUSCULAR | Status: AC
  Filled 2019-01-09: qty 1

## 2019-01-09 MED ORDER — CYANOCOBALAMIN 1000 MCG/ML IJ SOLN
1000.0000 ug | Freq: Once | INTRAMUSCULAR | Status: AC
  Administered 2019-01-09: 11:00:00 1000 ug via INTRAMUSCULAR

## 2019-01-09 NOTE — Progress Notes (Signed)
Carol Bradley presents today for injection per the provider's orders.  B12 administration without incident; see MAR for injection details.  Patient tolerated procedure well and without incident.  No questions or complaints noted at this time. D/c ambulatory

## 2019-01-13 ENCOUNTER — Inpatient Hospital Stay (HOSPITAL_COMMUNITY): Payer: Medicare Other

## 2019-01-13 ENCOUNTER — Other Ambulatory Visit: Payer: Self-pay

## 2019-01-13 VITALS — BP 134/45 | HR 87 | Temp 97.1°F | Resp 19

## 2019-01-13 DIAGNOSIS — E538 Deficiency of other specified B group vitamins: Secondary | ICD-10-CM

## 2019-01-13 DIAGNOSIS — D631 Anemia in chronic kidney disease: Secondary | ICD-10-CM

## 2019-01-13 DIAGNOSIS — D509 Iron deficiency anemia, unspecified: Secondary | ICD-10-CM

## 2019-01-13 DIAGNOSIS — C9000 Multiple myeloma not having achieved remission: Secondary | ICD-10-CM

## 2019-01-13 DIAGNOSIS — N183 Chronic kidney disease, stage 3 unspecified: Secondary | ICD-10-CM

## 2019-01-13 DIAGNOSIS — D801 Nonfamilial hypogammaglobulinemia: Secondary | ICD-10-CM

## 2019-01-13 LAB — CBC
HCT: 35 % — ABNORMAL LOW (ref 36.0–46.0)
Hemoglobin: 10.9 g/dL — ABNORMAL LOW (ref 12.0–15.0)
MCH: 32.4 pg (ref 26.0–34.0)
MCHC: 31.1 g/dL (ref 30.0–36.0)
MCV: 104.2 fL — ABNORMAL HIGH (ref 80.0–100.0)
Platelets: 170 10*3/uL (ref 150–400)
RBC: 3.36 MIL/uL — ABNORMAL LOW (ref 3.87–5.11)
RDW: 15.8 % — ABNORMAL HIGH (ref 11.5–15.5)
WBC: 9.4 10*3/uL (ref 4.0–10.5)
nRBC: 0 % (ref 0.0–0.2)

## 2019-01-13 MED ORDER — DARBEPOETIN ALFA 300 MCG/0.6ML IJ SOSY
300.0000 ug | PREFILLED_SYRINGE | Freq: Once | INTRAMUSCULAR | Status: AC
  Administered 2019-01-13: 300 ug via SUBCUTANEOUS

## 2019-01-13 MED ORDER — DARBEPOETIN ALFA 300 MCG/0.6ML IJ SOSY
PREFILLED_SYRINGE | INTRAMUSCULAR | Status: AC
  Filled 2019-01-13: qty 0.6

## 2019-01-13 NOTE — Progress Notes (Signed)
.  Carol Bradley presents today for injection per the provider's orders. Aransep administration without incident; see MAR for injection details.  Patient tolerated procedure well and without incident.  No questions or complaints noted at this time.

## 2019-01-14 ENCOUNTER — Other Ambulatory Visit (HOSPITAL_COMMUNITY): Payer: Self-pay | Admitting: *Deleted

## 2019-01-14 DIAGNOSIS — C9000 Multiple myeloma not having achieved remission: Secondary | ICD-10-CM

## 2019-01-14 MED ORDER — POMALIDOMIDE 2 MG PO CAPS: 2.0000 mg | ORAL_CAPSULE | Freq: Every day | ORAL | 0 refills | Status: DC

## 2019-01-16 ENCOUNTER — Other Ambulatory Visit: Payer: Self-pay | Admitting: Cardiology

## 2019-01-17 NOTE — Telephone Encounter (Signed)
Rx(s) sent to pharmacy electronically.  

## 2019-01-27 ENCOUNTER — Other Ambulatory Visit (HOSPITAL_COMMUNITY): Payer: Self-pay | Admitting: Nurse Practitioner

## 2019-01-27 ENCOUNTER — Other Ambulatory Visit (HOSPITAL_COMMUNITY): Payer: Self-pay | Admitting: *Deleted

## 2019-01-27 DIAGNOSIS — D509 Iron deficiency anemia, unspecified: Secondary | ICD-10-CM

## 2019-01-27 DIAGNOSIS — E538 Deficiency of other specified B group vitamins: Secondary | ICD-10-CM

## 2019-01-27 DIAGNOSIS — D631 Anemia in chronic kidney disease: Secondary | ICD-10-CM

## 2019-01-27 DIAGNOSIS — C9001 Multiple myeloma in remission: Secondary | ICD-10-CM

## 2019-01-27 DIAGNOSIS — C9000 Multiple myeloma not having achieved remission: Secondary | ICD-10-CM

## 2019-01-28 ENCOUNTER — Ambulatory Visit (HOSPITAL_COMMUNITY): Payer: Medicare Other | Admitting: Hematology

## 2019-01-28 ENCOUNTER — Other Ambulatory Visit (HOSPITAL_COMMUNITY): Payer: Self-pay | Admitting: *Deleted

## 2019-01-28 ENCOUNTER — Other Ambulatory Visit (HOSPITAL_COMMUNITY): Payer: Medicare Other

## 2019-01-28 ENCOUNTER — Ambulatory Visit (HOSPITAL_COMMUNITY): Payer: Medicare Other

## 2019-01-28 MED ORDER — MAGNESIUM OXIDE 400 MG PO TABS: ORAL_TABLET | ORAL | 2 refills | Status: DC

## 2019-01-31 ENCOUNTER — Other Ambulatory Visit: Payer: Self-pay | Admitting: Cardiology

## 2019-02-04 ENCOUNTER — Other Ambulatory Visit: Payer: Self-pay

## 2019-02-04 ENCOUNTER — Inpatient Hospital Stay (HOSPITAL_BASED_OUTPATIENT_CLINIC_OR_DEPARTMENT_OTHER): Payer: Medicare Other | Admitting: Hematology

## 2019-02-04 ENCOUNTER — Inpatient Hospital Stay (HOSPITAL_COMMUNITY): Payer: Medicare Other

## 2019-02-04 DIAGNOSIS — D801 Nonfamilial hypogammaglobulinemia: Secondary | ICD-10-CM

## 2019-02-04 DIAGNOSIS — C9000 Multiple myeloma not having achieved remission: Secondary | ICD-10-CM

## 2019-02-04 DIAGNOSIS — D631 Anemia in chronic kidney disease: Secondary | ICD-10-CM

## 2019-02-04 DIAGNOSIS — E538 Deficiency of other specified B group vitamins: Secondary | ICD-10-CM | POA: Diagnosis not present

## 2019-02-04 DIAGNOSIS — N183 Chronic kidney disease, stage 3 unspecified: Secondary | ICD-10-CM

## 2019-02-04 DIAGNOSIS — C9001 Multiple myeloma in remission: Secondary | ICD-10-CM

## 2019-02-04 DIAGNOSIS — D509 Iron deficiency anemia, unspecified: Secondary | ICD-10-CM

## 2019-02-04 LAB — CBC WITH DIFFERENTIAL/PLATELET
Abs Immature Granulocytes: 0.12 10*3/uL — ABNORMAL HIGH (ref 0.00–0.07)
Basophils Absolute: 0 10*3/uL (ref 0.0–0.1)
Basophils Relative: 0 %
Eosinophils Absolute: 0.2 10*3/uL (ref 0.0–0.5)
Eosinophils Relative: 2 %
HCT: 33.5 % — ABNORMAL LOW (ref 36.0–46.0)
Hemoglobin: 10.4 g/dL — ABNORMAL LOW (ref 12.0–15.0)
Immature Granulocytes: 1 %
Lymphocytes Relative: 10 %
Lymphs Abs: 1.1 10*3/uL (ref 0.7–4.0)
MCH: 31.1 pg (ref 26.0–34.0)
MCHC: 31 g/dL (ref 30.0–36.0)
MCV: 100.3 fL — ABNORMAL HIGH (ref 80.0–100.0)
Monocytes Absolute: 2.1 10*3/uL — ABNORMAL HIGH (ref 0.1–1.0)
Monocytes Relative: 20 %
Neutro Abs: 7.2 10*3/uL (ref 1.7–7.7)
Neutrophils Relative %: 67 %
Platelets: 313 10*3/uL (ref 150–400)
RBC: 3.34 MIL/uL — ABNORMAL LOW (ref 3.87–5.11)
RDW: 16.1 % — ABNORMAL HIGH (ref 11.5–15.5)
WBC: 10.7 10*3/uL — ABNORMAL HIGH (ref 4.0–10.5)
nRBC: 0 % (ref 0.0–0.2)

## 2019-02-04 LAB — COMPREHENSIVE METABOLIC PANEL
ALT: 16 U/L (ref 0–44)
AST: 13 U/L — ABNORMAL LOW (ref 15–41)
Albumin: 2.9 g/dL — ABNORMAL LOW (ref 3.5–5.0)
Alkaline Phosphatase: 68 U/L (ref 38–126)
Anion gap: 10 (ref 5–15)
BUN: 27 mg/dL — ABNORMAL HIGH (ref 8–23)
CO2: 23 mmol/L (ref 22–32)
Calcium: 8.1 mg/dL — ABNORMAL LOW (ref 8.9–10.3)
Chloride: 107 mmol/L (ref 98–111)
Creatinine, Ser: 1.24 mg/dL — ABNORMAL HIGH (ref 0.44–1.00)
GFR calc Af Amer: 48 mL/min — ABNORMAL LOW (ref >60)
GFR calc non Af Amer: 42 mL/min — ABNORMAL LOW (ref >60)
Glucose, Bld: 101 mg/dL — ABNORMAL HIGH (ref 70–99)
Potassium: 4 mmol/L (ref 3.5–5.1)
Sodium: 140 mmol/L (ref 135–145)
Total Bilirubin: 0.6 mg/dL (ref 0.3–1.2)
Total Protein: 6.2 g/dL — ABNORMAL LOW (ref 6.5–8.1)

## 2019-02-04 LAB — VITAMIN B12: Vitamin B-12: 915 pg/mL — ABNORMAL HIGH (ref 180–914)

## 2019-02-04 LAB — IRON AND TIBC
Iron: 18 ug/dL — ABNORMAL LOW (ref 28–170)
Saturation Ratios: 7 % — ABNORMAL LOW (ref 10.4–31.8)
TIBC: 273 ug/dL (ref 250–450)
UIBC: 255 ug/dL

## 2019-02-04 LAB — FOLATE: Folate: 11 ng/mL (ref >5.9)

## 2019-02-04 LAB — FERRITIN: Ferritin: 73 ng/mL (ref 11–307)

## 2019-02-04 MED ORDER — DARBEPOETIN ALFA 300 MCG/0.6ML IJ SOSY
300.0000 ug | PREFILLED_SYRINGE | Freq: Once | INTRAMUSCULAR | Status: AC
  Administered 2019-02-04: 300 ug via SUBCUTANEOUS
  Filled 2019-02-04: qty 0.6

## 2019-02-04 MED ORDER — AZITHROMYCIN 250 MG PO TABS: ORAL_TABLET | ORAL | 0 refills | Status: DC

## 2019-02-04 NOTE — Patient Instructions (Signed)
[facility] at [facility] Discharge Instructions  Received Aranesp injection today. Follow-up as scheduled. Call clinic for any questions or concerns   Thank you for choosing [facility] at [facility] to provide your oncology and hematology care.  To afford each patient quality time with our provider, please arrive at least 15 minutes before your scheduled appointment time.   If you have a lab appointment with the Cancer Center please come in thru the  Main Entrance and check in at the main information desk  You need to re-schedule your appointment should you arrive 10 or more minutes late.  We strive to give you quality time with our providers, and arriving late affects you and other patients whose appointments are after yours.  Also, if you no show three or more times for appointments you may be dismissed from the clinic at the providers discretion.     Again, thank you for choosing [facility].  Our hope is that these requests will decrease the amount of time that you wait before being seen by our physicians.       _____________________________________________________________  Should you have questions after your visit to [facility], please contact our office at [phone] between the hours of 8:00 a.m. and 4:30 p.m.  Voicemails left after 4:00 p.m. will not be returned until the following business day.  For prescription refill requests, have your pharmacy contact our office and allow 72 hours.    Cancer Center Support Programs:   > Cancer Support Group  2nd Tuesday of the month 1pm-2pm, Journey Room   

## 2019-02-04 NOTE — Progress Notes (Signed)
1230 Labs reviewed with and pt seen by R.Nester NP and pt approved for Aranesp injection today per NP.                      Carol Bradley tolerated Aranesp injection well without complaints or incident.Hgb 10.4 today VSS Pt discharged via wheelchair in satisfactory condition

## 2019-02-04 NOTE — Assessment & Plan Note (Signed)
1. 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 gain, -13 -Status post Velcade and dexamethasone from 03/18/2015 through 07/28/2015, Velcade discontinued secondary to neuropathy - Revlimid and dexamethasone from February 2017 through November 2017, achieving negative SPEP and immunofixation -Seen by Dr. Norma Fredrickson for second opinion, bone marrow biopsy on 11/14/2016 shows less than 2% plasma cells, maintenance Velcade started in May 2018, held in January 2019 due to diarrhea -Noted to have biochemical relapse with M spike of 0.5, recommended to start on reduced dose pomalidomide, Ninlaro and dexamethasone -Started first cycle of Pomalyst 2 mg (days 1-21 every 28 days), ixazomib 2.3 mg (days 1, 8, 15 every 28 days), and Dexamethasone 10 mg (weekly) on 08/24/2017 due to biochemical relapse.  She has tolerated it reasonably well although she felt somewhat tired.  She felt severely tired after cycle 2.  Ninlaro was held during third cycle due to excessive tiredness.  Cycle 4 of pomalidomide and dexamethasone started on 11/26/2017.  She is tolerating it very well without any side effects.  Her M spike on 11/23/2017 has come down to 0.2 from 0.4 on 10/29/2017.  Light chain ratio is stable at 2.4.  Kappa light chains have come down from 83-46. -Ninlaro 2.3 mg weekly was started back on 04/15/2018.  Pomalidomide dose is 2 mg, 3 weeks on 1 week off.  Dexamethasone is 10 mg every other week. -She was evaluated by Dr. Norma Fredrickson on 06/10/2018.  Myeloma blood work was stable. -We discussed her myeloma blood work from 11/13/2018.  Kappa light chains has increased to 129.5.  Free light chain ratio was 3.94, previously 2.08.  M spike was 0.3 g, previously 0.2 g on 10/02/2018. - Recommended increasing dexamethasone to 10 mg every week.  She has mild swelling in her  legs towards the end of the day.  She will use Lasix as needed. -Multiple myeloma labs from June 3 reveal stable M spike of 0.3, kappa light chains are stable at 115.0, lambda 30.1, and ratio 3.82.  We will continue with current treatment, Ninlaro 2.3 mg weekly, pomalidomide 2 mg 3 weeks on 1 week off, dexamethasone 10 mg weekly.  She is tolerating treatment well.  We will continue to monitor multiple myeloma labs closely. -Return to clinic in 1 month.  2.  Bone protection: -We will continue Zometa 3 mg every 3 months. -She will continue calcium twice daily.  3.  Peripheral neuropathy: -She has some numbness in the feet which is stable.  Is from prior Velcade.   4.  Normocytic anemia: -This is from combination of CKD and iron deficiency. - Last Feraheme was on 10/09/2018 and 10/16/2018. -She is receiving Aranesp 300 mcg every 2 weeks.

## 2019-02-04 NOTE — Progress Notes (Signed)
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    03/18/2015 - 07/28/2015 Chemotherapy   Velcade 1.6 mg/m2 discontinued secondary to intolerance   07/28/2015 Adverse Reaction   stool incontinence, weakness, collapse upon standing, felt to be secondary to velcade   11/09/2015 - 12/13/2015 Chemotherapy   cytoxan IV 300 mg/m2 administered X 2 doses only in addition to rev/dex   11/09/2015 - 06/08/2016 Chemotherapy   Revlimid/Dexamethasone    02/05/2017 - 07/09/2017 Chemotherapy   The patient had bortezomib SQ (VELCADE) chemo injection 1.75 mg, 1 mg/m2 = 1.75 mg (66.7 % of original dose 1.5 mg/m2), Subcutaneous,  Once, 1 of 8 cycles Dose modification: 1.5 mg/m2 (original dose 1.5 mg/m2, Cycle 1, Reason: Provider Judgment, Comment: Per Dr. Norma Fredrickson recommendations from [facility] 1 mg/m2 (original dose 1.5 mg/m2, Cycle 1, Reason: Provider Judgment)  for chemotherapy treatment.     07/09/2017 Adverse Reaction   Diarrhea    Chemotherapy   Pomalyst 2 mg (Days 1-21 every 28 days), Ixazomib 2.3 mg (days 1, 8, 15 every 28 days), and Dexamethasone 10 mg (weekly)- Rx's printed on 08/24/2017.  Treatment recommendations from Dr. Norma Fredrickson at [facility].        INTERVAL HISTORY:  [name] 78 y.o. female presents today for follow-up.  She reports overall doing well.  She continues with reports of mild fatigue.  She also reports possible sinus infection.  She states she has nasal drainage and sinus pressure.  She is requesting  antibiotics.  She continues on Ninlaro and pomalidomide, tolerating well.  She denies any neuropathies.  She denies any constipation or diarrhea.  No rash.    REVIEW OF SYSTEMS:  Review of Systems  Constitutional: Positive for fatigue.  HENT:  Negative.   Eyes: Negative.   Respiratory: Negative.   Cardiovascular: Negative.   Gastrointestinal: Negative.   Endocrine: Negative.   Genitourinary: Negative.    Musculoskeletal: Positive for arthralgias, back pain and gait problem.  Skin: Negative.   Neurological: Positive for extremity weakness and gait problem.  Psychiatric/Behavioral: Negative.      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   Multiple myeloma (HCC)    Vitamin B12 deficiency 04/15/2016   Overview:  Vitamin B12 level documented 155, January 2016 with the normal range being 211-924   Past Surgical History:  Procedure Laterality Date   BACK SURGERY     CARDIAC CATHETERIZATION  04/2011   right and left cath showing normal right heart pressures,but newly diagnosed coronary artery disease/drug eluting stent placed to RCA with residual disease in the proximal RCA and LAD and ramus, normal LV function and 60-65% EF   COLONOSCOPY N/A 09/30/2014   Procedure: COLONOSCOPY;  Surgeon: Rogene Houston, MD;  Location: [facility];  Service: Endoscopy;  Laterality: N/A;  225   CORONARY ANGIOPLASTY WITH STENT PLACEMENT  04/2011   mid RCA: 3.0 X38 mm Promus DES. 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  Social Needs   Financial resource strain: Not on file   Food insecurity    Worry: Not on file    Inability: Not on file   Transportation needs    Medical: Not on file    Non-medical: Not on file  Tobacco Use   Smoking status: Former Smoker    Packs/day: 3.00    Years: 25.00    Pack years: 75.00    Types: Cigarettes    Quit date: 07/10/1994    Years since quitting: 24.5   Smokeless tobacco: Never Used  Substance and Sexual Activity   Alcohol use: No   Drug use: Never   Sexual activity: Not on file  Lifestyle   Physical activity    Days per week: Not on file    Minutes per session: Not on file   Stress: Not on file  Relationships   Social connections    Talks on phone: Not on file    Gets together: Not on file    Attends religious service: Not on file    Active member of club or organization: Not on file    Attends meetings of clubs or organizations: Not on file    Relationship status: Not on file   Intimate partner violence    Fear of current or ex partner: Not on file    Emotionally abused: Not on file    Physically abused: Not on file    Forced sexual activity: Not on file  Other Topics Concern   Not on file  Social History Narrative   Not on file    FAMILY HISTORY:  Family History  Problem Relation Age of Onset   Heart failure Mother    Cancer Father     CURRENT MEDICATIONS:  Outpatient Encounter Medications as of 02/04/2019  Medication Sig Note   acyclovir (ZOVIRAX) 200 MG capsule TAKE ONE CAPSULE BY MOUTH TWICE DAILY    aspirin EC 81 MG tablet Take 1 tablet (81 mg total) by mouth daily.    atorvastatin (LIPITOR) 10 MG tablet TAKE ONE TABLET BY MOUTH DAILY    Calcium Carbonate-Vit D-Min (CALCIUM 600+D PLUS MINERALS) 600-400 MG-UNIT CHEW Chew 1 tablet by mouth 3 (three) times daily.      cyanocobalamin (,VITAMIN B-12,) 1000 MCG/ML injection Inject 1,000 mcg as directed every 30 (thirty) days.    dexamethasone (DECADRON) 2 MG tablet Take five tablets (55m) by mouth weekly. (Patient taking differently: Take five tablets (158m by mouth bi-weekly.) 12/26/2018: [facility] instructed patient to take medication weekly   diazepam (VALIUM) 5 MG tablet TAKE ONE TABLET BY MOUTH EVERY 6 HOURS AS NEEDED FOR ANXIETY    diltiazem (CARDIZEM CD) 240 MG 24 hr capsule Take 1 capsule (240 mg total) by mouth daily.    fluticasone (FLONASE) 50 MCG/ACT nasal spray Place into the nose.    furosemide (LASIX) 20 MG tablet Take 0.5 tablets (10 mg total) by  mouth daily as needed (as needed for ankle swelling).   °• guaiFENesin (MUCINEX) 600 MG 12 hr tablet Take 600 mg 2 (two) times daily as needed by mouth for cough or to loosen phlegm.   °• losartan (COZAAR) 50 MG tablet TAKE ONE TABLET BY MOUTH DAILY   °• magnesium oxide (MAG-OX) 400 MG tablet TAKE ONE TABLET BY MOUTH TWICE DAILY. WHEN TAKING LASIX   °• metoprolol tartrate (LOPRESSOR) 25 MG tablet TAKE ONE TABLET BY MOUTH TWICE DAILY   °• NINLARO 2.3 MG capsule    °• ondansetron (ZOFRAN) 8 MG tablet Take 1 tablet (8 mg total) by mouth every 8 (eight) hours as needed for nausea or vomiting.   °• pantoprazole (PROTONIX) 40 MG tablet    °• pomalidomide (POMALYST) 2 MG capsule Take 1 capsule (2 mg total) by mouth daily. Take with water on days 1-21. Repeat every 28 days.   °• potassium chloride (K-DUR,KLOR-CON) 10 MEQ tablet Take 1 tablet (10 mEq total) by mouth 3 (three) times daily. (Patient taking differently: Take 10 mEq by mouth once. )   °• umeclidinium-vilanterol (ANORO ELLIPTA) 62.5-25 MCG/INH AEPB Inhale into the lungs.   °• azithromycin (ZITHROMAX Z-PAK) 250 MG tablet Take 2 tabs (500mg) Day 1 and 1 tab (250mg) Day 2-5   ° °No facility-administered encounter medications on file as of 02/04/2019.   ° ° °ALLERGIES:  °Allergies  °Allergen Reactions  °• Lisinopril Cough   °• Plavix [Clopidogrel Bisulfate] Swelling  ° ° ° °PHYSICAL EXAM:  °ECOG Performance status: 2 ° °Vitals:  ° 02/04/19 1208  °BP: (!) 113/54  °Pulse: 80  °Resp: (!) 21  °Temp: 97.9 °F (36.6 °C)  °SpO2: 95%  ° °Filed Weights  ° 02/04/19 1208  °Weight: 164 lb (74.4 kg)  ° ° °Physical Exam °Constitutional:   °   Appearance: Normal appearance. She is obese.  °HENT:  °   Head: Normocephalic.  °   Nose: Nose normal.  °   Mouth/Throat:  °   Mouth: Mucous membranes are moist.  °   Pharynx: Oropharynx is clear.  °Eyes:  °   Extraocular Movements: Extraocular movements intact.  °   Conjunctiva/sclera: Conjunctivae normal.  °Cardiovascular:  °   Rate and Rhythm: Normal rate and regular rhythm.  °   Pulses: Normal pulses.  °   Heart sounds: Normal heart sounds.  °Pulmonary:  °   Effort: Pulmonary effort is normal.  °   Breath sounds: Normal breath sounds.  °Abdominal:  °   General: Bowel sounds are normal.  °Musculoskeletal: Normal range of motion.  °Skin: °   General: Skin is warm and dry.  °Neurological:  °   General: No focal deficit present.  °   Mental Status: She is alert and oriented to person, place, and time.  °Psychiatric:     °   Mood and Affect: Mood normal.     °   Behavior: Behavior normal.     °   Thought Content: Thought content normal.     °   Judgment: Judgment normal.  ° ° ° ° °LABORATORY DATA:  °I have reviewed the labs as listed.  °CBC °   °Component Value Date/Time  ° WBC 10.7 (H) 02/04/2019 1118  ° RBC 3.34 (L) 02/04/2019 1118  ° HGB 10.4 (L) 02/04/2019 1118  ° HCT 33.5 (L) 02/04/2019 1118  ° PLT 313 02/04/2019 1118  ° MCV 100.3 (H) 02/04/2019 1118  ° MCH 31.1 02/04/2019 1118  ° MCHC   31.0 02/04/2019 1118  ° RDW 16.1 (H) 02/04/2019 1118  ° LYMPHSABS 1.1 02/04/2019 1118  ° MONOABS 2.1 (H) 02/04/2019 1118  ° EOSABS 0.2 02/04/2019 1118  ° BASOSABS 0.0 02/04/2019 1118  ° °CMP Latest Ref Rng & Units 02/04/2019 12/26/2018 12/11/2018  °Glucose 70 - 99 mg/dL 101(H) 157(H) 117(H)  °BUN 8 - 23 mg/dL 27(H) 22 33(H)    °Creatinine 0.44 - 1.00 mg/dL 1.24(H) 1.40(H) 1.38(H)  °Sodium 135 - 145 mmol/L 140 139 140  °Potassium 3.5 - 5.1 mmol/L 4.0 4.3 3.7  °Chloride 98 - 111 mmol/L 107 104 107  °CO2 22 - 32 mmol/L 23 21(L) 23  °Calcium 8.9 - 10.3 mg/dL 8.1(L) 9.5 8.8(L)  °Total Protein 6.5 - 8.1 g/dL 6.2(L) 6.9 6.2(L)  °Total Bilirubin 0.3 - 1.2 mg/dL 0.6 0.5 0.6  °Alkaline Phos 38 - 126 U/L 68 78 90  °AST 15 - 41 U/L 13(L) 24 19  °ALT 0 - 44 U/L 16 16 19  ° ° ° ° ° ° °ASSESSMENT & PLAN:  ° °Multiple myeloma (HCC) °1. 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 gain, -13 °-Status post Velcade and dexamethasone from 03/18/2015 through 07/28/2015, Velcade discontinued secondary to neuropathy °- Revlimid and dexamethasone from February 2017 through November 2017, achieving negative SPEP and immunofixation °-Seen by Dr. Rodriguez for second opinion, bone marrow biopsy on 11/14/2016 shows less than 2% plasma cells, maintenance Velcade started in May 2018, held in January 2019 due to diarrhea °-Noted to have biochemical relapse with M spike of 0.5, recommended to start on reduced dose pomalidomide, Ninlaro and dexamethasone °-Started first cycle of Pomalyst 2 mg (days 1-21 every 28 days), ixazomib 2.3 mg (days 1, 8, 15 every 28 days), and Dexamethasone 10 mg (weekly) on 08/24/2017 due to biochemical relapse.  She has tolerated it reasonably well although she felt somewhat tired.  She felt severely tired after cycle 2.  Ninlaro was held during third cycle due to excessive tiredness.  Cycle 4 of pomalidomide and dexamethasone started on 11/26/2017.  She is tolerating it very well without any side effects.  Her M spike on 11/23/2017 has come down to 0.2 from 0.4 on 10/29/2017.  Light chain ratio is stable at 2.4.  Kappa light chains have come down from 83-46. °-Ninlaro 2.3 mg weekly  was started back on 04/15/2018.  Pomalidomide dose is 2 mg, 3 weeks on 1 week off.  Dexamethasone is 10 mg every other week. °-She was evaluated by Dr. Rodriguez on 06/10/2018.  Myeloma blood work was stable. °-We discussed her myeloma blood work from 11/13/2018.  Kappa light chains has increased to 129.5.  Free light chain ratio was 3.94, previously 2.08.  M spike was 0.3 g, previously 0.2 g on 10/02/2018. °- Recommended increasing dexamethasone to 10 mg every week.  She has mild swelling in her legs towards the end of the day.  She will use Lasix as needed. °-Multiple myeloma labs from June 3 reveal stable M spike of 0.3, kappa light chains are stable at 115.0, lambda 30.1, and ratio 3.82.  We will continue with current treatment, Ninlaro 2.3 mg weekly, pomalidomide 2 mg 3 weeks on 1 week off, dexamethasone 10 mg weekly.  She is tolerating treatment well.  We will continue to monitor multiple myeloma labs closely. °-Return to clinic in 1 month. ° °  2.  Bone protection: -We will continue Zometa 3 mg every 3 months. -She will continue calcium twice daily.  3.  Peripheral neuropathy: -She has some numbness in the feet which is stable.  Is from prior Velcade.   4.  Normocytic anemia: -This is from combination of CKD and iron deficiency. - Last Feraheme was on 10/09/2018 and 10/16/2018. -She is receiving Aranesp 300 mcg every 2 weeks.          Orders placed this encounter:  Orders Placed This Encounter  Procedures   Multiple Myeloma Panel (SPEP&IFE w/QIG)   Kappa/lambda light chains     [place] [phone]

## 2019-02-05 LAB — VITAMIN D 25 HYDROXY (VIT D DEFICIENCY, FRACTURES): Vit D, 25-Hydroxy: 42.9 ng/mL (ref 30.0–100.0)

## 2019-02-05 LAB — KAPPA/LAMBDA LIGHT CHAINS
Kappa free light chain: 139.6 mg/L — ABNORMAL HIGH (ref 3.3–19.4)
Kappa, lambda light chain ratio: 4.9 — ABNORMAL HIGH (ref 0.26–1.65)
Lambda free light chains: 28.5 mg/L — ABNORMAL HIGH (ref 5.7–26.3)

## 2019-02-06 LAB — MULTIPLE MYELOMA PANEL, SERUM
Albumin SerPl Elph-Mcnc: 2.8 g/dL — ABNORMAL LOW (ref 2.9–4.4)
Albumin/Glob SerPl: 1.1 (ref 0.7–1.7)
Alpha 1: 0.3 g/dL (ref 0.0–0.4)
Alpha2 Glob SerPl Elph-Mcnc: 1 g/dL (ref 0.4–1.0)
B-Globulin SerPl Elph-Mcnc: 0.7 g/dL (ref 0.7–1.3)
Gamma Glob SerPl Elph-Mcnc: 0.7 g/dL (ref 0.4–1.8)
Globulin, Total: 2.8 g/dL (ref 2.2–3.9)
IgA: 92 mg/dL (ref 64–422)
IgG (Immunoglobin G), Serum: 768 mg/dL (ref 586–1602)
IgM (Immunoglobulin M), Srm: 87 mg/dL (ref 26–217)
M Protein SerPl Elph-Mcnc: 0.3 g/dL — ABNORMAL HIGH
Total Protein ELP: 5.6 g/dL — ABNORMAL LOW (ref 6.0–8.5)

## 2019-02-11 ENCOUNTER — Other Ambulatory Visit (HOSPITAL_COMMUNITY): Payer: Self-pay | Admitting: *Deleted

## 2019-02-11 DIAGNOSIS — C9000 Multiple myeloma not having achieved remission: Secondary | ICD-10-CM

## 2019-02-11 MED ORDER — POMALIDOMIDE 2 MG PO CAPS: 2.0000 mg | ORAL_CAPSULE | Freq: Every day | ORAL | 0 refills | Status: DC

## 2019-02-11 NOTE — Telephone Encounter (Signed)
Chart reviewed, per [place] NP's last office note, pomalyst refilled.

## 2019-02-12 ENCOUNTER — Other Ambulatory Visit: Payer: Self-pay

## 2019-02-12 ENCOUNTER — Inpatient Hospital Stay (HOSPITAL_COMMUNITY): Payer: Medicare Other | Attending: Hematology

## 2019-02-12 VITALS — BP 106/39 | HR 82 | Temp 98.0°F | Resp 20

## 2019-02-12 DIAGNOSIS — H538 Other visual disturbances: Secondary | ICD-10-CM | POA: Insufficient documentation

## 2019-02-12 DIAGNOSIS — E785 Hyperlipidemia, unspecified: Secondary | ICD-10-CM | POA: Insufficient documentation

## 2019-02-12 DIAGNOSIS — Z9221 Personal history of antineoplastic chemotherapy: Secondary | ICD-10-CM | POA: Insufficient documentation

## 2019-02-12 DIAGNOSIS — E538 Deficiency of other specified B group vitamins: Secondary | ICD-10-CM | POA: Insufficient documentation

## 2019-02-12 DIAGNOSIS — Z79899 Other long term (current) drug therapy: Secondary | ICD-10-CM | POA: Diagnosis not present

## 2019-02-12 DIAGNOSIS — N183 Chronic kidney disease, stage 3 (moderate): Secondary | ICD-10-CM | POA: Diagnosis not present

## 2019-02-12 DIAGNOSIS — D631 Anemia in chronic kidney disease: Secondary | ICD-10-CM | POA: Diagnosis not present

## 2019-02-12 DIAGNOSIS — R5383 Other fatigue: Secondary | ICD-10-CM | POA: Insufficient documentation

## 2019-02-12 DIAGNOSIS — I2510 Atherosclerotic heart disease of native coronary artery without angina pectoris: Secondary | ICD-10-CM | POA: Insufficient documentation

## 2019-02-12 DIAGNOSIS — Z87891 Personal history of nicotine dependence: Secondary | ICD-10-CM | POA: Insufficient documentation

## 2019-02-12 DIAGNOSIS — Z7982 Long term (current) use of aspirin: Secondary | ICD-10-CM | POA: Diagnosis not present

## 2019-02-12 DIAGNOSIS — C9000 Multiple myeloma not having achieved remission: Secondary | ICD-10-CM | POA: Insufficient documentation

## 2019-02-12 DIAGNOSIS — G629 Polyneuropathy, unspecified: Secondary | ICD-10-CM | POA: Diagnosis not present

## 2019-02-12 DIAGNOSIS — H5712 Ocular pain, left eye: Secondary | ICD-10-CM | POA: Insufficient documentation

## 2019-02-12 DIAGNOSIS — J449 Chronic obstructive pulmonary disease, unspecified: Secondary | ICD-10-CM | POA: Insufficient documentation

## 2019-02-12 DIAGNOSIS — R531 Weakness: Secondary | ICD-10-CM | POA: Insufficient documentation

## 2019-02-12 DIAGNOSIS — I129 Hypertensive chronic kidney disease with stage 1 through stage 4 chronic kidney disease, or unspecified chronic kidney disease: Secondary | ICD-10-CM | POA: Diagnosis not present

## 2019-02-12 MED ORDER — CYANOCOBALAMIN 1000 MCG/ML IJ SOLN
INTRAMUSCULAR | Status: AC
  Filled 2019-02-12: qty 1

## 2019-02-12 MED ORDER — CYANOCOBALAMIN 1000 MCG/ML IJ SOLN
1000.0000 ug | Freq: Once | INTRAMUSCULAR | Status: AC
  Administered 2019-02-12: 1000 ug via INTRAMUSCULAR

## 2019-02-12 NOTE — Patient Instructions (Signed)
[facility] at [facility]  Discharge Instructions:  B12 injection received today.  Cyanocobalamin, Vitamin B12 injection What is this medicine? CYANOCOBALAMIN (sye an oh koe BAL a min) is a man made form of vitamin B12. Vitamin B12 is used in the growth of healthy blood cells, nerve cells, and proteins in the body. It also helps with the metabolism of fats and carbohydrates. This medicine is used to treat people who can not absorb vitamin B12. This medicine may be used for other purposes; ask your health care provider or pharmacist if you have questions. COMMON BRAND NAME(S): B-12 Compliance Kit, B-12 Injection Kit, Cyomin, LA-12, Nutri-Twelve, Physicians EZ Use B-12, Primabalt What should I tell my health care provider before I take this medicine? They need to know if you have any of these conditions:  kidney disease  Leber's disease  megaloblastic anemia  an unusual or allergic reaction to cyanocobalamin, cobalt, other medicines, foods, dyes, or preservatives  pregnant or trying to get pregnant  breast-feeding How should I use this medicine? This medicine is injected into a muscle or deeply under the skin. It is usually given by a health care professional in a clinic or doctor's office. However, your doctor may teach you how to inject yourself. Follow all instructions. Talk to your pediatrician regarding the use of this medicine in children. Special care may be needed. Overdosage: If you think you have taken too much of this medicine contact a poison control center or emergency room at once. NOTE: This medicine is only for you. Do not share this medicine with others. What if I miss a dose? If you are given your dose at a clinic or doctor's office, call to reschedule your appointment. If you give your own injections and you miss a dose, take it as soon as you can. If it is almost time for your next dose, take only that dose. Do not take double or extra  doses. What may interact with this medicine?  colchicine  heavy alcohol intake This list may not describe all possible interactions. Give your health care provider a list of all the medicines, herbs, non-prescription drugs, or dietary supplements you use. Also tell them if you smoke, drink alcohol, or use illegal drugs. Some items may interact with your medicine. What should I watch for while using this medicine? Visit your doctor or health care professional regularly. You may need blood work done while you are taking this medicine. You may need to follow a special diet. Talk to your doctor. Limit your alcohol intake and avoid smoking to get the best benefit. What side effects may I notice from receiving this medicine? Side effects that you should report to your doctor or health care professional as soon as possible:  allergic reactions like skin rash, itching or hives, swelling of the face, lips, or tongue  blue tint to skin  chest tightness, pain  difficulty breathing, wheezing  dizziness  red, swollen painful area on the leg Side effects that usually do not require medical attention (report to your doctor or health care professional if they continue or are bothersome):  diarrhea  headache This list may not describe all possible side effects. Call your doctor for medical advice about side effects. You may report side effects to FDA at 1-800-FDA-1088. Where should I keep my medicine? Keep out of the reach of children. Store at room temperature between 15 and 30 degrees C (59 and 85 degrees F). Protect from light. Throw away   any unused medicine after the expiration date. NOTE: This sheet is a summary. It may not cover all possible information. If you have questions about this medicine, talk to your doctor, pharmacist, or health care provider.  2020 Elsevier/Gold Standard (2007-10-07 22:10:20)  _______________________________________________________________  Thank you for  choosing [facility] at [facility] to provide your oncology and hematology care.  To afford each patient quality time with our providers, please arrive at least 15 minutes before your scheduled appointment.  You need to re-schedule your appointment if you arrive 10 or more minutes late.  We strive to give you quality time with our providers, and arriving late affects you and other patients whose appointments are after yours.  Also, if you no show three or more times for appointments you may be dismissed from the clinic.  Again, thank you for choosing [facility]. Our hope is that these requests will allow you access to exceptional care and in a timely manner. _______________________________________________________________  If you have questions after your visit, please contact our office at [phone] between the hours of 8:30 a.m. and 5:00 p.m. Voicemails left after 4:30 p.m. will not be returned until the following business day. _______________________________________________________________  For prescription refill requests, have your pharmacy contact our office. _______________________________________________________________  Recommendations made by the consultant and any test results will be sent to your referring physician. _______________________________________________________________ 

## 2019-02-12 NOTE — Progress Notes (Signed)
Carol Bradley presents today for B12 injection. VSS. Injection tolerated without incident or complaint. See MAR for details. Patient discharged in satisfactory condition with follow up instructions.

## 2019-02-17 ENCOUNTER — Other Ambulatory Visit (HOSPITAL_COMMUNITY): Payer: Self-pay | Admitting: *Deleted

## 2019-02-17 DIAGNOSIS — D509 Iron deficiency anemia, unspecified: Secondary | ICD-10-CM

## 2019-02-17 DIAGNOSIS — E538 Deficiency of other specified B group vitamins: Secondary | ICD-10-CM

## 2019-02-17 DIAGNOSIS — N183 Chronic kidney disease, stage 3 unspecified: Secondary | ICD-10-CM

## 2019-02-17 DIAGNOSIS — D631 Anemia in chronic kidney disease: Secondary | ICD-10-CM

## 2019-02-18 ENCOUNTER — Other Ambulatory Visit: Payer: Self-pay

## 2019-02-18 ENCOUNTER — Encounter (HOSPITAL_COMMUNITY): Payer: Self-pay

## 2019-02-18 ENCOUNTER — Inpatient Hospital Stay (HOSPITAL_COMMUNITY): Payer: Medicare Other

## 2019-02-18 ENCOUNTER — Other Ambulatory Visit (HOSPITAL_COMMUNITY): Payer: Self-pay

## 2019-02-18 VITALS — BP 117/48 | HR 76 | Temp 97.0°F | Resp 18

## 2019-02-18 DIAGNOSIS — D509 Iron deficiency anemia, unspecified: Secondary | ICD-10-CM

## 2019-02-18 DIAGNOSIS — C9000 Multiple myeloma not having achieved remission: Secondary | ICD-10-CM

## 2019-02-18 DIAGNOSIS — D631 Anemia in chronic kidney disease: Secondary | ICD-10-CM

## 2019-02-18 DIAGNOSIS — E538 Deficiency of other specified B group vitamins: Secondary | ICD-10-CM

## 2019-02-18 DIAGNOSIS — N183 Chronic kidney disease, stage 3 unspecified: Secondary | ICD-10-CM

## 2019-02-18 DIAGNOSIS — D801 Nonfamilial hypogammaglobulinemia: Secondary | ICD-10-CM

## 2019-02-18 LAB — CBC
HCT: 32.8 % — ABNORMAL LOW (ref 36.0–46.0)
Hemoglobin: 10 g/dL — ABNORMAL LOW (ref 12.0–15.0)
MCH: 30.9 pg (ref 26.0–34.0)
MCHC: 30.5 g/dL (ref 30.0–36.0)
MCV: 101.2 fL — ABNORMAL HIGH (ref 80.0–100.0)
Platelets: 298 10*3/uL (ref 150–400)
RBC: 3.24 MIL/uL — ABNORMAL LOW (ref 3.87–5.11)
RDW: 17.8 % — ABNORMAL HIGH (ref 11.5–15.5)
WBC: 10.3 10*3/uL (ref 4.0–10.5)
nRBC: 0 % (ref 0.0–0.2)

## 2019-02-18 MED ORDER — DARBEPOETIN ALFA 300 MCG/0.6ML IJ SOSY
300.0000 ug | PREFILLED_SYRINGE | Freq: Once | INTRAMUSCULAR | Status: AC
  Administered 2019-02-18: 300 ug via SUBCUTANEOUS
  Filled 2019-02-18: qty 0.6

## 2019-02-18 NOTE — Progress Notes (Signed)
Patient tolerated injection with no complaints voiced.  Site clean and dry with no bruising or swelling noted at site.  Band aid applied.  Vss with discharge and left ambulatory with no s/s of distress noted.  

## 2019-02-22 ENCOUNTER — Other Ambulatory Visit: Payer: Self-pay | Admitting: Cardiology

## 2019-03-04 ENCOUNTER — Other Ambulatory Visit: Payer: Self-pay

## 2019-03-04 ENCOUNTER — Inpatient Hospital Stay (HOSPITAL_BASED_OUTPATIENT_CLINIC_OR_DEPARTMENT_OTHER): Payer: Medicare Other | Admitting: Nurse Practitioner

## 2019-03-04 ENCOUNTER — Inpatient Hospital Stay (HOSPITAL_COMMUNITY): Payer: Medicare Other

## 2019-03-04 VITALS — BP 115/54 | HR 79 | Temp 96.8°F | Wt 161.3 lb

## 2019-03-04 DIAGNOSIS — H5712 Ocular pain, left eye: Secondary | ICD-10-CM

## 2019-03-04 DIAGNOSIS — D631 Anemia in chronic kidney disease: Secondary | ICD-10-CM

## 2019-03-04 DIAGNOSIS — D509 Iron deficiency anemia, unspecified: Secondary | ICD-10-CM

## 2019-03-04 DIAGNOSIS — C9000 Multiple myeloma not having achieved remission: Secondary | ICD-10-CM | POA: Diagnosis not present

## 2019-03-04 DIAGNOSIS — E538 Deficiency of other specified B group vitamins: Secondary | ICD-10-CM

## 2019-03-04 DIAGNOSIS — D801 Nonfamilial hypogammaglobulinemia: Secondary | ICD-10-CM

## 2019-03-04 LAB — COMPREHENSIVE METABOLIC PANEL
ALT: 16 U/L (ref 0–44)
AST: 16 U/L (ref 15–41)
Albumin: 3 g/dL — ABNORMAL LOW (ref 3.5–5.0)
Alkaline Phosphatase: 72 U/L (ref 38–126)
Anion gap: 9 (ref 5–15)
BUN: 29 mg/dL — ABNORMAL HIGH (ref 8–23)
CO2: 25 mmol/L (ref 22–32)
Calcium: 8.6 mg/dL — ABNORMAL LOW (ref 8.9–10.3)
Chloride: 105 mmol/L (ref 98–111)
Creatinine, Ser: 1.26 mg/dL — ABNORMAL HIGH (ref 0.44–1.00)
GFR calc Af Amer: 47 mL/min — ABNORMAL LOW (ref >60)
GFR calc non Af Amer: 41 mL/min — ABNORMAL LOW (ref >60)
Glucose, Bld: 132 mg/dL — ABNORMAL HIGH (ref 70–99)
Potassium: 3.4 mmol/L — ABNORMAL LOW (ref 3.5–5.1)
Sodium: 139 mmol/L (ref 135–145)
Total Bilirubin: 0.5 mg/dL (ref 0.3–1.2)
Total Protein: 6.2 g/dL — ABNORMAL LOW (ref 6.5–8.1)

## 2019-03-04 LAB — FOLATE: Folate: 9.2 ng/mL (ref >5.9)

## 2019-03-04 LAB — CBC WITH DIFFERENTIAL/PLATELET
Abs Immature Granulocytes: 0.1 10*3/uL — ABNORMAL HIGH (ref 0.00–0.07)
Basophils Absolute: 0 10*3/uL (ref 0.0–0.1)
Basophils Relative: 0 %
Eosinophils Absolute: 0.3 10*3/uL (ref 0.0–0.5)
Eosinophils Relative: 2 %
HCT: 35.4 % — ABNORMAL LOW (ref 36.0–46.0)
Hemoglobin: 10.5 g/dL — ABNORMAL LOW (ref 12.0–15.0)
Immature Granulocytes: 1 %
Lymphocytes Relative: 11 %
Lymphs Abs: 1.5 10*3/uL (ref 0.7–4.0)
MCH: 29.8 pg (ref 26.0–34.0)
MCHC: 29.7 g/dL — ABNORMAL LOW (ref 30.0–36.0)
MCV: 100.6 fL — ABNORMAL HIGH (ref 80.0–100.0)
Monocytes Absolute: 2.4 10*3/uL — ABNORMAL HIGH (ref 0.1–1.0)
Monocytes Relative: 19 %
Neutro Abs: 8.6 10*3/uL — ABNORMAL HIGH (ref 1.7–7.7)
Neutrophils Relative %: 67 %
Platelets: 305 10*3/uL (ref 150–400)
RBC: 3.52 MIL/uL — ABNORMAL LOW (ref 3.87–5.11)
RDW: 18.3 % — ABNORMAL HIGH (ref 11.5–15.5)
WBC: 12.8 10*3/uL — ABNORMAL HIGH (ref 4.0–10.5)
nRBC: 0 % (ref 0.0–0.2)

## 2019-03-04 LAB — IRON AND TIBC
Iron: 23 ug/dL — ABNORMAL LOW (ref 28–170)
Saturation Ratios: 7 % — ABNORMAL LOW (ref 10.4–31.8)
TIBC: 308 ug/dL (ref 250–450)
UIBC: 285 ug/dL

## 2019-03-04 LAB — FERRITIN: Ferritin: 26 ng/mL (ref 11–307)

## 2019-03-04 MED ORDER — DARBEPOETIN ALFA 300 MCG/0.6ML IJ SOSY
300.0000 ug | PREFILLED_SYRINGE | Freq: Once | INTRAMUSCULAR | Status: AC
  Administered 2019-03-04: 15:00:00 300 ug via SUBCUTANEOUS
  Filled 2019-03-04: qty 0.6

## 2019-03-04 NOTE — Patient Instructions (Signed)
[place] at [facility] Discharge Instructions  Follow up in 4 weeks with MRI and labs    Thank you for choosing [place] at [facility] to provide your oncology and hematology care.  To afford each patient quality time with our provider, please arrive at least 15 minutes before your scheduled appointment time.   If you have a lab appointment with the [place] please come in thru the Main Entrance and check in at the main information desk.  You need to re-schedule your appointment should you arrive 10 or more minutes late.  We strive to give you quality time with our providers, and arriving late affects you and other patients whose appointments are after yours.  Also, if you no show three or more times for appointments you may be dismissed from the clinic at the providers discretion.     Again, thank you for choosing [facility].  Our hope is that these requests will decrease the amount of time that you wait before being seen by our physicians.       _____________________________________________________________  Should you have questions after your visit to [facility], please contact our office at [phone] between the hours of 8:00 a.m. and 4:30 p.m.  Voicemails left after 4:00 p.m. will not be returned until the following business day.  For prescription refill requests, have your pharmacy contact our office and allow 72 hours.    Due to Covid, you will need to wear a mask upon entering the hospital. If you do not have a mask, a mask will be given to you at the Main Entrance upon arrival. For doctor visits, patients may have 1 support person with them. For treatment visits, patients can not have anyone with them due to social distancing guidelines and our immunocompromised population.

## 2019-03-04 NOTE — Assessment & Plan Note (Addendum)
1.  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 gain, -13. -Status post Velcade and dexamethasone from 03/18/2015 through 07/28/2015, Velcade discontinued secondary to neuropathy. - Status post Revlimid and dexamethasone from 08/2015 through 05/2016, achieving negative SPEP and immunofixation. -Dr. Norma Fredrickson saw her for a second opinion and did a bone marrow biopsy on 11/14/2016 showed less than 2% plasma cells, started maintenance Velcade on 11/2016 through 07/2017 stopped due to diarrhea. -Noted to have a biochemical relapse with M spike of 0.5.  Started on a reduced dose of Pomalyst Ninlaro and dexamethasone started on 08/24/2017. -She was seen by Dr. Norma Fredrickson on 12/10/2017 and was recommended to cut back on the dexamethasone 10 mg to every other week.  She also underwent cardiology evaluation for work-up for shortness of breath which was negative. - Ninlaro was held during the third cycle due to excessive tiredness.  She started back Ninlaro 2.3 mg weekly on 04/15/2018.  Pomalyst dose is 2 mg, 3 weeks on 1 week off.  Dexamethasone is 10 mg every other week. -She was last seen by Dr. Norma Fredrickson on 06/10/2018.  He reports she is stable at this time.  He sees her back every 6 months. -Labs on 03/04/2019 showed her potassium 3.4, creatinine 1.26, calcium 8.6, WBC 12.8, hemoglobin 10.5, platelets 35. -She is continuing tolerating the medication very well with some fatigue and weakness. - We will see her back in 4 weeks with repeat labs.  2.  Bone protection: -She will continue Zometa 3 mg every 3 months. -Labs on 03/04/2019 showed her calcium 8.6 -She was told to continue calcium twice daily.  3.  Peripheral neuropathy: -This is from prior Velcade. -She still has numbness in her feet which is stable at this time.  4.  Normocytic  anemia: -This is from a combination of CKD and iron deficiency anemia. -She is receiving Aranesp 300 mcg every 2 weeks. -Labs on 03/04/2019 showed her hemoglobin 10.5 she will receive her Aranesp injection today. - Labs on 03/04/2019 showed ferritin 26 and percent saturation 7. -She was set up for 2 infusions of Feraheme.   -We will recheck her iron panel in 3 months.  5.  Left orbital pain: - Patient has been having left orbital pain that is causing blurry vision in her left eye.  She reports under her eye becomes very red and irritated and the pain is constant all day long.  She has not found anything to relieve the pain. -She had a CT maxillofacial without contrast on 09/13/2018 which showed frothy secretions within the sphenoid sinus.  Otherwise normal CT of the face. -She was given dose of antibiotics which did not help. -We set up an MRI of the orbital's we will see her back afterwards.

## 2019-03-04 NOTE — Progress Notes (Signed)
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    03/18/2015 - 07/28/2015 Chemotherapy   Velcade 1.6 mg/m2 discontinued secondary to intolerance   07/28/2015 Adverse Reaction   stool incontinence, weakness, collapse upon standing, felt to be secondary to velcade   11/09/2015 - 12/13/2015 Chemotherapy   cytoxan IV 300 mg/m2 administered X 2 doses only in addition to rev/dex   11/09/2015 - 06/08/2016 Chemotherapy   Revlimid/Dexamethasone    02/05/2017 - 07/09/2017 Chemotherapy   The patient had bortezomib SQ (VELCADE) chemo injection 1.75 mg, 1 mg/m2 = 1.75 mg (66.7 % of original dose 1.5 mg/m2), Subcutaneous,  Once, 1 of 8 cycles Dose modification: 1.5 mg/m2 (original dose 1.5 mg/m2, Cycle 1, Reason: Provider Judgment, Comment: Per Dr. Norma Fredrickson recommendations from [facility] 1 mg/m2 (original dose 1.5 mg/m2, Cycle 1, Reason: Provider Judgment)  for chemotherapy treatment.     07/09/2017 Adverse Reaction   Diarrhea    Chemotherapy   Pomalyst 2 mg (Days 1-21 every 28 days), Ixazomib 2.3 mg (days 1, 8, 15 every 28 days), and Dexamethasone 10 mg (weekly)- Rx's printed on 08/24/2017.  Treatment recommendations from Dr. Norma Fredrickson at [facility].     INTERVAL HISTORY:  Ms. Pownall 78 y.o. female returns for routine follow-up for multiple myeloma.  She reports she is still having left eye pain which is occasionally blurry.  She was given a dose of antibiotics by her PCP which did not help.  Otherwise she is doing great since her  last visit.  She denies any B symptoms. Denies any nausea, vomiting, or diarrhea. Denies any new pains. Had not noticed any recent bleeding such as epistaxis, hematuria or hematochezia. Denies recent chest pain on exertion, shortness of breath on minimal exertion, pre-syncopal episodes, or palpitations. Denies any numbness or tingling in hands or feet. Denies any recent fevers, infections, or recent hospitalizations. Patient reports appetite at 50% and energy level at 50%.  She is eating well maintaining her weight at this time.     REVIEW OF SYSTEMS:  Review of Systems  Eyes: Positive for eye problems (eye pain).  All other systems reviewed and are negative.    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   Multiple myeloma ([facility])    Vitamin B12 deficiency 04/15/2016   Overview:  Vitamin B12 level documented 155, January 2016 with the normal range being 211-924   Past Surgical History:  Procedure Laterality Date   BACK SURGERY     CARDIAC CATHETERIZATION  04/2011   right and left cath showing normal right heart pressures,but newly diagnosed coronary artery disease/drug eluting stent placed to RCA with residual disease in the proximal RCA and LAD and ramus, normal LV function and 60-65% EF   COLONOSCOPY N/A 09/30/2014  Procedure: COLONOSCOPY;  Surgeon: Rogene Houston, MD;  Location: [facility];  Service: Endoscopy;  Laterality: N/A;  225   CORONARY ANGIOPLASTY WITH STENT PLACEMENT  04/2011   mid RCA: 3.0 X38 mm Promus DES. 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  Social Needs   Financial resource strain: Not on file   Food insecurity    Worry: Not on file    Inability: Not on file   Transportation needs    Medical: Not on file    Non-medical: Not on file  Tobacco Use   Smoking status: Former Smoker    Packs/day: 3.00    Years: 25.00    Pack years: 75.00    Types: Cigarettes    Quit date: 07/10/1994    Years since quitting: 24.6   Smokeless tobacco: Never Used  Substance and Sexual Activity   Alcohol use: No   Drug use: Never   Sexual activity: Not on file  Lifestyle   Physical activity    Days per week: Not on file    Minutes per session: Not on file   Stress: Not on file  Relationships   Social connections    Talks on phone: Not on file    Gets together: Not on file    Attends religious service: Not on file    Active member of club or organization: Not on file    Attends meetings of clubs or organizations: Not on file    Relationship status: Not on file   Intimate partner violence    Fear of current or ex partner: Not on file    Emotionally abused: Not on file    Physically abused: Not on file    Forced sexual activity: Not on file  Other Topics Concern   Not on file  Social History Narrative   Not on file    FAMILY HISTORY:  Family History  Problem Relation Age of Onset   Heart failure Mother    Cancer Father     CURRENT MEDICATIONS:  Outpatient Encounter Medications as of 03/04/2019  Medication Sig Note   acyclovir (ZOVIRAX) 200 MG capsule TAKE ONE CAPSULE BY MOUTH TWICE DAILY    aspirin EC 81 MG tablet Take 1 tablet (81 mg total) by mouth daily.    atorvastatin (LIPITOR) 10 MG tablet TAKE ONE TABLET BY MOUTH DAILY    Calcium Carbonate-Vit D-Min (CALCIUM 600+D PLUS MINERALS) 600-400 MG-UNIT CHEW Chew  1 tablet by mouth 3 (three) times daily.     cyanocobalamin (,VITAMIN B-12,) 1000 MCG/ML injection Inject 1,000 mcg as directed every 30 (thirty) days.    dexamethasone (DECADRON) 2 MG tablet Take five tablets ('10mg'$ ) by mouth weekly. (Patient taking differently: Take five tablets ('10mg'$ ) by mouth bi-weekly.) 12/26/2018: [facility] instructed patient to take medication weekly   diltiazem (CARDIZEM CD) 240 MG 24 hr capsule Take 1 capsule (240 mg total) by mouth daily.    losartan (COZAAR) 50 MG tablet TAKE ONE TABLET BY MOUTH DAILY    magnesium oxide (MAG-OX) 400 MG tablet TAKE ONE TABLET BY MOUTH TWICE DAILY. WHEN TAKING LASIX  metoprolol tartrate (LOPRESSOR) 25 MG tablet TAKE ONE TABLET BY MOUTH TWICE DAILY    NINLARO 2.3 MG capsule     pomalidomide (POMALYST) 2 MG capsule Take 1 capsule (2 mg total) by mouth daily. Take with water on days 1-21. Repeat every 28 days.    potassium chloride (K-DUR,KLOR-CON) 10 MEQ tablet Take 1 tablet (10 mEq total) by mouth 3 (three) times daily. (Patient taking differently: Take 10 mEq by mouth once. )    diazepam (VALIUM) 5 MG tablet TAKE ONE TABLET BY MOUTH EVERY 6 HOURS AS NEEDED FOR ANXIETY (Patient not taking: Reported on 03/04/2019)    fluticasone (FLONASE) 50 MCG/ACT nasal spray Place into the nose.    furosemide (LASIX) 20 MG tablet Take 0.5 tablets (10 mg total) by mouth daily as needed (as needed for ankle swelling). (Patient not taking: Reported on 03/04/2019)    guaiFENesin (MUCINEX) 600 MG 12 hr tablet Take 600 mg 2 (two) times daily as needed by mouth for cough or to loosen phlegm.    ondansetron (ZOFRAN) 8 MG tablet Take 1 tablet (8 mg total) by mouth every 8 (eight) hours as needed for nausea or vomiting. (Patient not taking: Reported on 03/04/2019)    pantoprazole (PROTONIX) 40 MG tablet     umeclidinium-vilanterol (ANORO ELLIPTA) 62.5-25 MCG/INH AEPB Inhale into the lungs.    [DISCONTINUED] azithromycin (ZITHROMAX Z-PAK) 250 MG tablet Take 2  tabs ('500mg'$ ) Day 1 and 1 tab ('250mg'$ ) Day 2-5    No facility-administered encounter medications on file as of 03/04/2019.     ALLERGIES:  Allergies  Allergen Reactions   Lisinopril Cough   Plavix [Clopidogrel Bisulfate] Swelling     PHYSICAL EXAM:  ECOG Performance status: 1  Vitals:   03/04/19 1328  BP: (!) 115/54  Pulse: 79  Temp: (!) 96.8 F (36 C)  SpO2: 98%   Filed Weights   03/04/19 1328  Weight: 161 lb 5 oz (73.2 kg)    Physical Exam Constitutional:      Appearance: Normal appearance. She is normal weight.  Cardiovascular:     Rate and Rhythm: Normal rate and regular rhythm.     Heart sounds: Normal heart sounds.  Pulmonary:     Effort: Pulmonary effort is normal.     Breath sounds: Normal breath sounds.  Abdominal:     General: Bowel sounds are normal.     Palpations: Abdomen is soft.  Musculoskeletal: Normal range of motion.  Skin:    General: Skin is warm and dry.  Neurological:     Mental Status: She is alert and oriented to person, place, and time. Mental status is at baseline.  Psychiatric:        Mood and Affect: Mood normal.        Behavior: Behavior normal.        Thought Content: Thought content normal.        Judgment: Judgment normal.      LABORATORY DATA:  I have reviewed the labs as listed.  CBC    Component Value Date/Time   WBC 12.8 (H) 03/04/2019 1239   RBC 3.52 (L) 03/04/2019 1239   HGB 10.5 (L) 03/04/2019 1239   HCT 35.4 (L) 03/04/2019 1239   PLT 305 03/04/2019 1239   MCV 100.6 (H) 03/04/2019 1239   MCH 29.8 03/04/2019 1239   MCHC 29.7 (L) 03/04/2019 1239   RDW 18.3 (H) 03/04/2019 1239   LYMPHSABS 1.5 03/04/2019 1239   MONOABS 2.4 (H) 03/04/2019 1239   EOSABS  0.3 03/04/2019 1239   BASOSABS 0.0 03/04/2019 1239   CMP Latest Ref Rng & Units 03/04/2019 02/04/2019 12/26/2018  Glucose 70 - 99 mg/dL 132(H) 101(H) 157(H)  BUN 8 - 23 mg/dL 29(H) 27(H) 22  Creatinine 0.44 - 1.00 mg/dL 1.26(H) 1.24(H) 1.40(H)  Sodium 135 - 145  mmol/L 139 140 139  Potassium 3.5 - 5.1 mmol/L 3.4(L) 4.0 4.3  Chloride 98 - 111 mmol/L 105 107 104  CO2 22 - 32 mmol/L 25 23 21(L)  Calcium 8.9 - 10.3 mg/dL 8.6(L) 8.1(L) 9.5  Total Protein 6.5 - 8.1 g/dL 6.2(L) 6.2(L) 6.9  Total Bilirubin 0.3 - 1.2 mg/dL 0.5 0.6 0.5  Alkaline Phos 38 - 126 U/L 72 68 78  AST 15 - 41 U/L 16 13(L) 24  ALT 0 - 44 U/L '16 16 16       '$ I personally performed a face-to-face visit.  All questions were answered to patient's stated satisfaction. Encouraged patient to call with any new concerns or questions before his next visit to the cancer center and we can certain see him sooner, if needed.    ASSESSMENT & PLAN:   Multiple myeloma ([place]) 1.  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 gain, -13. -Status post Velcade and dexamethasone from 03/18/2015 through 07/28/2015, Velcade discontinued secondary to neuropathy. - Status post Revlimid and dexamethasone from 08/2015 through 05/2016, achieving negative SPEP and immunofixation. -Dr. Norma Fredrickson saw her for a second opinion and did a bone marrow biopsy on 11/14/2016 showed less than 2% plasma cells, started maintenance Velcade on 11/2016 through 07/2017 stopped due to diarrhea. -Noted to have a biochemical relapse with M spike of 0.5.  Started on a reduced dose of Pomalyst Ninlaro and dexamethasone started on 08/24/2017. -She was seen by Dr. Norma Fredrickson on 12/10/2017 and was recommended to cut back on the dexamethasone 10 mg to every other week.  She also underwent cardiology evaluation for work-up for shortness of breath which was negative. - Ninlaro was held during the third cycle due to excessive tiredness.  She started back Ninlaro 2.3 mg weekly on 04/15/2018.  Pomalyst dose is 2 mg, 3 weeks on 1 week off.  Dexamethasone is 10 mg every other week. -She was  last seen by Dr. Norma Fredrickson on 06/10/2018.  He reports she is stable at this time.  He sees her back every 6 months. -Labs on 03/04/2019 showed her potassium 3.4, creatinine 1.26, calcium 8.6, WBC 12.8, hemoglobin 10.5, platelets 35. -She is continuing tolerating the medication very well with some fatigue and weakness. - We will see her back in 4 weeks with repeat labs.  2.  Bone protection: -She will continue Zometa 3 mg every 3 months. -Labs on 03/04/2019 showed her calcium 8.6 -She was told to continue calcium twice daily.  3.  Peripheral neuropathy: -This is from prior Velcade. -She still has numbness in her feet which is stable at this time.  4.  Normocytic anemia: -This is from a combination of CKD and iron deficiency anemia. -She is receiving Aranesp 300 mcg every 2 weeks. -Labs on 03/04/2019 showed her hemoglobin 10.5 she will receive her Aranesp injection today. - Labs on 03/04/2019 showed ferritin 26 and percent saturation 7. -She was set up for 2 infusions of Feraheme.   -We will recheck her iron panel in 3  months.  5.  Left orbital pain: - Patient has been having left orbital pain that is causing blurry vision in her left eye.  She reports under her eye becomes very red and irritated and the pain is constant all day long.  She has not found anything to relieve the pain. -She had a CT maxillofacial without contrast on 09/13/2018 which showed frothy secretions within the sphenoid sinus.  Otherwise normal CT of the face. -She was given dose of antibiotics which did not help. -We set up an MRI of the orbital's we will see her back afterwards.      Orders placed this encounter:  Orders Placed This Encounter  Procedures   MR ORBITS W WO CONTRAST   Lactate dehydrogenase   Protein electrophoresis, serum   Kappa/lambda light chains   CBC with Differential/Platelet   Comprehensive metabolic panel      Francene Finders FNP-C [place] [phone]

## 2019-03-04 NOTE — Progress Notes (Signed)
Patient tolerated injection with no complaints voiced.  Site clean and dry with no bruising or swelling noted at site.  Band aid applied.  Vss with discharge and left ambulatory with no s/s of distress noted.  

## 2019-03-10 ENCOUNTER — Inpatient Hospital Stay (HOSPITAL_COMMUNITY): Payer: Medicare Other

## 2019-03-10 ENCOUNTER — Other Ambulatory Visit: Payer: Self-pay

## 2019-03-10 ENCOUNTER — Encounter (HOSPITAL_COMMUNITY): Payer: Self-pay

## 2019-03-10 ENCOUNTER — Ambulatory Visit (INDEPENDENT_AMBULATORY_CARE_PROVIDER_SITE_OTHER): Payer: Medicare Other | Admitting: Otolaryngology

## 2019-03-10 VITALS — BP 107/58 | HR 75 | Temp 97.9°F | Resp 18

## 2019-03-10 DIAGNOSIS — E538 Deficiency of other specified B group vitamins: Secondary | ICD-10-CM

## 2019-03-10 DIAGNOSIS — D631 Anemia in chronic kidney disease: Secondary | ICD-10-CM

## 2019-03-10 DIAGNOSIS — D509 Iron deficiency anemia, unspecified: Secondary | ICD-10-CM

## 2019-03-10 DIAGNOSIS — C9000 Multiple myeloma not having achieved remission: Secondary | ICD-10-CM | POA: Diagnosis not present

## 2019-03-10 MED ORDER — CYANOCOBALAMIN 1000 MCG/ML IJ SOLN
INTRAMUSCULAR | Status: AC
  Filled 2019-03-10: qty 1

## 2019-03-10 MED ORDER — SODIUM CHLORIDE 0.9 % IV SOLN
510.0000 mg | Freq: Once | INTRAVENOUS | Status: AC
  Administered 2019-03-10: 15:00:00 510 mg via INTRAVENOUS
  Filled 2019-03-10: qty 510

## 2019-03-10 MED ORDER — CYANOCOBALAMIN 1000 MCG/ML IJ SOLN
1000.0000 ug | Freq: Once | INTRAMUSCULAR | Status: AC
  Administered 2019-03-10: 15:00:00 1000 ug via INTRAMUSCULAR

## 2019-03-10 MED ORDER — SODIUM CHLORIDE 0.9 % IV SOLN
INTRAVENOUS | Status: DC
  Administered 2019-03-10: 15:00:00 via INTRAVENOUS

## 2019-03-10 NOTE — Progress Notes (Signed)
Carol Bradley Jacinto tolerated Feraheme infusion and Vit B12 injection well without complaints or incident. Peripheral IV site checked with positive blood return noted prior to and after infusion. VSS upon discharge. Pt discharged via wheelchair in satisfactory condition

## 2019-03-10 NOTE — Patient Instructions (Addendum)
[place] at [facility] Discharge Instructions  Received Feraheme infusion and Vi B12 injection today. Follow-up as scheduled. Call clinic for any questions or concerns   Thank you for choosing [place] at [facility] to provide your oncology and hematology care.  To afford each patient quality time with our provider, please arrive at least 15 minutes before your scheduled appointment time.   If you have a lab appointment with the [place] please come in thru the Main Entrance and check in at the main information desk.  You need to re-schedule your appointment should you arrive 10 or more minutes late.  We strive to give you quality time with our providers, and arriving late affects you and other patients whose appointments are after yours.  Also, if you no show three or more times for appointments you may be dismissed from the clinic at the providers discretion.     Again, thank you for choosing [facility].  Our hope is that these requests will decrease the amount of time that you wait before being seen by our physicians.       _____________________________________________________________  Should you have questions after your visit to [facility], please contact our office at [phone] between the hours of 8:00 a.m. and 4:30 p.m.  Voicemails left after 4:00 p.m. will not be returned until the following business day.  For prescription refill requests, have your pharmacy contact our office and allow 72 hours.    Due to Covid, you will need to wear a mask upon entering the hospital. If you do not have a mask, a mask will be given to you at the Main Entrance upon arrival. For doctor visits, patients may have 1 support person with them. For treatment visits, patients can not have anyone with them due to social distancing guidelines and our immunocompromised population.

## 2019-03-12 ENCOUNTER — Ambulatory Visit (HOSPITAL_COMMUNITY): Payer: Medicare Other

## 2019-03-14 ENCOUNTER — Other Ambulatory Visit (HOSPITAL_COMMUNITY): Payer: Self-pay | Admitting: *Deleted

## 2019-03-14 DIAGNOSIS — C9000 Multiple myeloma not having achieved remission: Secondary | ICD-10-CM

## 2019-03-14 MED ORDER — POMALIDOMIDE 2 MG PO CAPS: 2.0000 mg | ORAL_CAPSULE | Freq: Every day | ORAL | 0 refills | Status: DC

## 2019-03-18 ENCOUNTER — Other Ambulatory Visit: Payer: Self-pay

## 2019-03-18 ENCOUNTER — Encounter (HOSPITAL_COMMUNITY): Payer: Self-pay

## 2019-03-18 ENCOUNTER — Inpatient Hospital Stay (HOSPITAL_COMMUNITY): Payer: Medicare Other | Attending: Hematology

## 2019-03-18 VITALS — BP 121/43 | HR 73 | Temp 97.6°F | Resp 18

## 2019-03-18 DIAGNOSIS — C9000 Multiple myeloma not having achieved remission: Secondary | ICD-10-CM | POA: Insufficient documentation

## 2019-03-18 DIAGNOSIS — I2510 Atherosclerotic heart disease of native coronary artery without angina pectoris: Secondary | ICD-10-CM | POA: Diagnosis not present

## 2019-03-18 DIAGNOSIS — Z9221 Personal history of antineoplastic chemotherapy: Secondary | ICD-10-CM | POA: Insufficient documentation

## 2019-03-18 DIAGNOSIS — D631 Anemia in chronic kidney disease: Secondary | ICD-10-CM | POA: Insufficient documentation

## 2019-03-18 DIAGNOSIS — H5712 Ocular pain, left eye: Secondary | ICD-10-CM | POA: Diagnosis not present

## 2019-03-18 DIAGNOSIS — Z79899 Other long term (current) drug therapy: Secondary | ICD-10-CM | POA: Diagnosis not present

## 2019-03-18 DIAGNOSIS — D509 Iron deficiency anemia, unspecified: Secondary | ICD-10-CM

## 2019-03-18 DIAGNOSIS — N183 Chronic kidney disease, stage 3 unspecified: Secondary | ICD-10-CM

## 2019-03-18 DIAGNOSIS — E785 Hyperlipidemia, unspecified: Secondary | ICD-10-CM | POA: Diagnosis not present

## 2019-03-18 DIAGNOSIS — Z7982 Long term (current) use of aspirin: Secondary | ICD-10-CM | POA: Diagnosis not present

## 2019-03-18 DIAGNOSIS — E538 Deficiency of other specified B group vitamins: Secondary | ICD-10-CM

## 2019-03-18 DIAGNOSIS — I129 Hypertensive chronic kidney disease with stage 1 through stage 4 chronic kidney disease, or unspecified chronic kidney disease: Secondary | ICD-10-CM | POA: Diagnosis not present

## 2019-03-18 DIAGNOSIS — Z87891 Personal history of nicotine dependence: Secondary | ICD-10-CM | POA: Diagnosis not present

## 2019-03-18 MED ORDER — SODIUM CHLORIDE 0.9 % IV SOLN
INTRAVENOUS | Status: DC
  Administered 2019-03-18: 14:00:00 via INTRAVENOUS

## 2019-03-18 MED ORDER — SODIUM CHLORIDE 0.9 % IV SOLN
510.0000 mg | Freq: Once | INTRAVENOUS | Status: AC
  Administered 2019-03-18: 510 mg via INTRAVENOUS
  Filled 2019-03-18: qty 510

## 2019-03-18 NOTE — Progress Notes (Signed)
Feraheme given per orders. Patient tolerated it well without problems. Vitals stable and discharged home from clinic ambulatory. Follow up as scheduled.  

## 2019-03-19 ENCOUNTER — Inpatient Hospital Stay (HOSPITAL_COMMUNITY): Payer: Medicare Other

## 2019-03-19 ENCOUNTER — Ambulatory Visit (HOSPITAL_COMMUNITY): Payer: Medicare Other

## 2019-03-20 ENCOUNTER — Encounter (HOSPITAL_COMMUNITY): Payer: Self-pay

## 2019-03-20 ENCOUNTER — Other Ambulatory Visit (HOSPITAL_COMMUNITY): Payer: Self-pay | Admitting: Hematology

## 2019-03-20 ENCOUNTER — Other Ambulatory Visit (HOSPITAL_COMMUNITY): Payer: Medicare Other

## 2019-03-20 ENCOUNTER — Other Ambulatory Visit: Payer: Self-pay

## 2019-03-20 ENCOUNTER — Inpatient Hospital Stay (HOSPITAL_COMMUNITY): Payer: Medicare Other

## 2019-03-20 VITALS — BP 108/44 | HR 64 | Temp 97.5°F | Resp 18 | Wt 164.4 lb

## 2019-03-20 DIAGNOSIS — D509 Iron deficiency anemia, unspecified: Secondary | ICD-10-CM

## 2019-03-20 DIAGNOSIS — C9000 Multiple myeloma not having achieved remission: Secondary | ICD-10-CM | POA: Diagnosis not present

## 2019-03-20 DIAGNOSIS — E538 Deficiency of other specified B group vitamins: Secondary | ICD-10-CM

## 2019-03-20 DIAGNOSIS — D801 Nonfamilial hypogammaglobulinemia: Secondary | ICD-10-CM

## 2019-03-20 DIAGNOSIS — D631 Anemia in chronic kidney disease: Secondary | ICD-10-CM

## 2019-03-20 LAB — COMPREHENSIVE METABOLIC PANEL
ALT: 18 U/L (ref 0–44)
AST: 20 U/L (ref 15–41)
Albumin: 3.1 g/dL — ABNORMAL LOW (ref 3.5–5.0)
Alkaline Phosphatase: 71 U/L (ref 38–126)
Anion gap: 8 (ref 5–15)
BUN: 30 mg/dL — ABNORMAL HIGH (ref 8–23)
CO2: 24 mmol/L (ref 22–32)
Calcium: 8.5 mg/dL — ABNORMAL LOW (ref 8.9–10.3)
Chloride: 108 mmol/L (ref 98–111)
Creatinine, Ser: 1.22 mg/dL — ABNORMAL HIGH (ref 0.44–1.00)
GFR calc Af Amer: 49 mL/min — ABNORMAL LOW (ref >60)
GFR calc non Af Amer: 42 mL/min — ABNORMAL LOW (ref >60)
Glucose, Bld: 117 mg/dL — ABNORMAL HIGH (ref 70–99)
Potassium: 3.8 mmol/L (ref 3.5–5.1)
Sodium: 140 mmol/L (ref 135–145)
Total Bilirubin: 0.5 mg/dL (ref 0.3–1.2)
Total Protein: 6 g/dL — ABNORMAL LOW (ref 6.5–8.1)

## 2019-03-20 LAB — CBC WITH DIFFERENTIAL/PLATELET
Abs Immature Granulocytes: 0.3 10*3/uL — ABNORMAL HIGH (ref 0.00–0.07)
Basophils Absolute: 0.1 10*3/uL (ref 0.0–0.1)
Basophils Relative: 1 %
Eosinophils Absolute: 0.3 10*3/uL (ref 0.0–0.5)
Eosinophils Relative: 3 %
HCT: 36 % (ref 36.0–46.0)
Hemoglobin: 10.8 g/dL — ABNORMAL LOW (ref 12.0–15.0)
Immature Granulocytes: 4 %
Lymphocytes Relative: 12 %
Lymphs Abs: 0.9 10*3/uL (ref 0.7–4.0)
MCH: 31.3 pg (ref 26.0–34.0)
MCHC: 30 g/dL (ref 30.0–36.0)
MCV: 104.3 fL — ABNORMAL HIGH (ref 80.0–100.0)
Monocytes Absolute: 1.7 10*3/uL — ABNORMAL HIGH (ref 0.1–1.0)
Monocytes Relative: 22 %
Neutro Abs: 4.6 10*3/uL (ref 1.7–7.7)
Neutrophils Relative %: 58 %
Platelets: 232 10*3/uL (ref 150–400)
RBC: 3.45 MIL/uL — ABNORMAL LOW (ref 3.87–5.11)
RDW: 21.1 % — ABNORMAL HIGH (ref 11.5–15.5)
WBC: 7.8 10*3/uL (ref 4.0–10.5)
nRBC: 0.3 % — ABNORMAL HIGH (ref 0.0–0.2)

## 2019-03-20 LAB — LACTATE DEHYDROGENASE: LDH: 175 U/L (ref 98–192)

## 2019-03-20 MED ORDER — DARBEPOETIN ALFA 300 MCG/0.6ML IJ SOSY
300.0000 ug | PREFILLED_SYRINGE | Freq: Once | INTRAMUSCULAR | Status: AC
  Administered 2019-03-20: 300 ug via SUBCUTANEOUS
  Filled 2019-03-20: qty 0.6

## 2019-03-20 MED ORDER — SODIUM CHLORIDE 0.9 % IV SOLN
INTRAVENOUS | Status: DC
  Administered 2019-03-20: 13:00:00 via INTRAVENOUS

## 2019-03-20 MED ORDER — ZOLEDRONIC ACID 4 MG/5ML IV CONC
3.0000 mg | Freq: Once | INTRAVENOUS | Status: AC
  Administered 2019-03-20: 3 mg via INTRAVENOUS
  Filled 2019-03-20: qty 3.75

## 2019-03-20 NOTE — Patient Instructions (Signed)
[place] at [facility] Discharge Instructions  Received Zometa infusion and Aranesp injection today. Follow-up as scheduled. Call clinic for any questions or concerns   Thank you for choosing [place] at [facility] to provide your oncology and hematology care.  To afford each patient quality time with our provider, please arrive at least 15 minutes before your scheduled appointment time.   If you have a lab appointment with the [place] please come in thru the Main Entrance and check in at the main information desk.  You need to re-schedule your appointment should you arrive 10 or more minutes late.  We strive to give you quality time with our providers, and arriving late affects you and other patients whose appointments are after yours.  Also, if you no show three or more times for appointments you may be dismissed from the clinic at the providers discretion.     Again, thank you for choosing [facility].  Our hope is that these requests will decrease the amount of time that you wait before being seen by our physicians.       _____________________________________________________________  Should you have questions after your visit to [facility], please contact our office at [phone] between the hours of 8:00 a.m. and 4:30 p.m.  Voicemails left after 4:00 p.m. will not be returned until the following business day.  For prescription refill requests, have your pharmacy contact our office and allow 72 hours.    Due to Covid, you will need to wear a mask upon entering the hospital. If you do not have a mask, a mask will be given to you at the Main Entrance upon arrival. For doctor visits, patients may have 1 support person with them. For treatment visits, patients can not have anyone with them due to social distancing guidelines and our immunocompromised population.

## 2019-03-20 NOTE — Progress Notes (Signed)
Carol Bradley tolerated Zometa infusion and Aranesp injection well without complaints or incident. Calcium 8.5 today and pt denied any tooth or jaw pain and no recent or future dental visits prior to administering the Zometa infusion.Pt continues to take her Calcium as prescribed without issues Hgb 10.8 today. VSS Pt discharged self ambulatory using her cane in satisfactory condition

## 2019-03-21 ENCOUNTER — Ambulatory Visit (HOSPITAL_COMMUNITY)
Admission: RE | Admit: 2019-03-21 | Discharge: 2019-03-21 | Disposition: A | Discharge status: Home / Self Care | Payer: Medicare Other | Source: Ambulatory Visit | Attending: Nurse Practitioner | Admitting: Nurse Practitioner

## 2019-03-21 DIAGNOSIS — H5712 Ocular pain, left eye: Secondary | ICD-10-CM | POA: Diagnosis present

## 2019-03-21 LAB — KAPPA/LAMBDA LIGHT CHAINS
Kappa free light chain: 195.1 mg/L — ABNORMAL HIGH (ref 3.3–19.4)
Kappa, lambda light chain ratio: 10.96 — ABNORMAL HIGH (ref 0.26–1.65)
Lambda free light chains: 17.8 mg/L (ref 5.7–26.3)

## 2019-03-21 LAB — PROTEIN ELECTROPHORESIS, SERUM
A/G Ratio: 1 (ref 0.7–1.7)
Albumin ELP: 2.8 g/dL — ABNORMAL LOW (ref 2.9–4.4)
Alpha-1-Globulin: 0.2 g/dL (ref 0.0–0.4)
Alpha-2-Globulin: 0.9 g/dL (ref 0.4–1.0)
Beta Globulin: 0.8 g/dL (ref 0.7–1.3)
Gamma Globulin: 0.7 g/dL (ref 0.4–1.8)
Globulin, Total: 2.7 g/dL (ref 2.2–3.9)
M-Spike, %: 0.5 g/dL — ABNORMAL HIGH
Total Protein ELP: 5.5 g/dL — ABNORMAL LOW (ref 6.0–8.5)

## 2019-03-21 MED ORDER — GADOBUTROL 1 MMOL/ML IV SOLN
7.5000 mL | Freq: Once | INTRAVENOUS | Status: AC | PRN
  Administered 2019-03-21: 12:00:00 7.5 mL via INTRAVENOUS

## 2019-03-25 ENCOUNTER — Inpatient Hospital Stay (HOSPITAL_BASED_OUTPATIENT_CLINIC_OR_DEPARTMENT_OTHER): Payer: Medicare Other | Admitting: Hematology

## 2019-03-25 ENCOUNTER — Encounter (HOSPITAL_COMMUNITY): Payer: Self-pay | Admitting: Hematology

## 2019-03-25 ENCOUNTER — Other Ambulatory Visit: Payer: Self-pay

## 2019-03-25 VITALS — BP 118/59 | HR 95 | Temp 97.3°F | Resp 21

## 2019-03-25 DIAGNOSIS — C9000 Multiple myeloma not having achieved remission: Secondary | ICD-10-CM

## 2019-03-25 MED ORDER — UMECLIDINIUM-VILANTEROL 62.5-25 MCG/INH IN AEPB: 1.0000 | INHALATION_SPRAY | Freq: Every day | RESPIRATORY_TRACT | 1 refills | Status: AC

## 2019-03-25 NOTE — Assessment & Plan Note (Signed)
1.  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 -Revlimid and dexamethasone from February 2017 through 05/2016, achieving negative SPEP and immunofixation. - Bone marrow biopsy on 11/14/2016 shows less than 2% plasma cells, started maintenance Velcade on 11/2016 through 07/2017, stopped due to diarrhea. -Noted to have a biochemical relapse with M spike of 0.5, started on reduced dose of Pomalyst, Ninlaro and dexamethasone on 08/24/2017.  She takes dexamethasone 10 mg every other week. - Myeloma blood work from 19 2020 shows M spike of 0.5.  Free light chain ratio has gone up to 10.96. - I have recommended continuing same regimen at this time.  I will reevaluate her in 4 weeks.  If it continues to get worse, we will consider changing therapy.  2.  Left orbit pain: - MRI of the orbits on 03/20/2018 was negative. -She reported some vision problems also.  I have recommended evaluation by ophthalmology.  3.  Bone protection: -She is continuing Zometa 3 mg every 3 months.  4.  Normocytic anemia: -This is from combination of CKD and diarrhea. - She received Feraheme on 03/09/2028 at 2020.  She will continue Aranesp every 2 weeks.

## 2019-03-25 NOTE — Progress Notes (Signed)
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    03/18/2015 - 07/28/2015 Chemotherapy   Velcade 1.6 mg/m2 discontinued secondary to intolerance   07/28/2015 Adverse Reaction   stool incontinence, weakness, collapse upon standing, felt to be secondary to velcade   11/09/2015 - 12/13/2015 Chemotherapy   cytoxan IV 300 mg/m2 administered X 2 doses only in addition to rev/dex   11/09/2015 - 06/08/2016 Chemotherapy   Revlimid/Dexamethasone    02/05/2017 - 07/09/2017 Chemotherapy   The patient had bortezomib SQ (VELCADE) chemo injection 1.75 mg, 1 mg/m2 = 1.75 mg (66.7 % of original dose 1.5 mg/m2), Subcutaneous,  Once, 1 of 8 cycles Dose modification: 1.5 mg/m2 (original dose 1.5 mg/m2, Cycle 1, Reason: Provider Judgment, Comment: Per Dr. Norma Fredrickson recommendations from [facility] 1 mg/m2 (original dose 1.5 mg/m2, Cycle 1, Reason: Provider Judgment)  for chemotherapy treatment.     07/09/2017 Adverse Reaction   Diarrhea    Chemotherapy   Pomalyst 2 mg (Days 1-21 every 28 days), Ixazomib 2.3 mg (days 1, 8, 15 every 28 days), and Dexamethasone 10 mg (weekly)- Rx's printed on 08/24/2017.  Treatment recommendations from Dr. Norma Fredrickson at [facility].      CANCER STAGING: Cancer Staging No matching staging information was found for the patient.   INTERVAL HISTORY:  Carol Bradley 78 y.o. female seen for follow-up of multiple myeloma and left orbital pain.  She still has some pain despite using 2-3 different antibiotics.  She also complains of vision changes in the  eyes.  Occasional dizziness has been stable.  Appetite is 75%.  Energy levels are 25%.  Shortness of breath on exertion is also stable.  She is taking dexamethasone 10 mg every other week.  Denies any fevers or chills.  Tingling or numbness in the extremities has been stable.  REVIEW OF SYSTEMS:  Review of Systems  Respiratory: Positive for shortness of breath.   Neurological: Positive for numbness.  All other systems reviewed and are negative.    PAST MEDICAL/SURGICAL HISTORY:  Past Medical History:  Diagnosis Date  . 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 CAD (coronary artery disease)   . COPD (chronic obstructive pulmonary disease) ([place])   . History of tobacco abuse   . Hyperlipidemia   . Hypertension   . 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 Multiple myeloma ([place])   . Vitamin B12 deficiency 04/15/2016   Overview:  Vitamin B12 level documented 155, January 2016 with the normal range being 211-924   Past Surgical History:  Procedure Laterality Date  . BACK SURGERY    . CARDIAC CATHETERIZATION  04/2011   right and left cath showing normal right heart pressures,but newly diagnosed coronary artery disease/drug eluting stent placed to RCA with residual disease in the proximal RCA and LAD and ramus, normal LV function and 60-65% EF  . COLONOSCOPY N/A 09/30/2014   Procedure: COLONOSCOPY;  Surgeon: Rogene Houston, MD;  Location: [facility];  Service: Endoscopy;  Laterality: N/A;  225  . CORONARY ANGIOPLASTY WITH STENT PLACEMENT  04/2011  mid RCA: 3.0 X38 mm Promus DES. Residual 40% disease proximally  . NECK SURGERY       SOCIAL HISTORY:  Social History   Socioeconomic History  . Marital status: Widowed    Spouse name: Not on file  . Number of children: Not on file  . Years of education: Not on file  . Highest education level: Not on file  Occupational History  . 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  Financial resource strain: Not on file  . Food insecurity    Worry: Not on file    Inability: Not on file  . Transportation needs    Medical: Not on file    Non-medical: Not on file  Tobacco Use  . Smoking status: Former Smoker    Packs/day: 3.00    Years: 25.00    Pack years: 75.00    Types: Cigarettes    Quit date: 07/10/1994    Years since quitting: 24.7  . Smokeless tobacco: Never Used  Substance and Sexual Activity  . Alcohol use: No  . Drug use: Never  . Sexual activity: Not on file  Lifestyle  . Physical activity    Days per week: Not on file    Minutes per session: Not on file  . Stress: Not on file  Relationships  . Social Herbalist on phone: Not on file    Gets together: Not on file    Attends religious service: Not on file    Active member of club or organization: Not on file    Attends meetings of clubs or organizations: Not on file    Relationship status: Not on file  . Intimate partner violence    Fear of current or ex partner: Not on file    Emotionally abused: Not on file    Physically abused: Not on file    Forced sexual activity: Not on file  Other Topics Concern  . Not on file  Social History Narrative  . Not on file    FAMILY HISTORY:  Family History  Problem Relation Age of Onset  . Heart failure Mother   . Cancer Father     CURRENT MEDICATIONS:  Outpatient Encounter Medications as of 03/25/2019  Medication Sig Note  . acyclovir (ZOVIRAX) 200 MG capsule TAKE ONE CAPSULE BY MOUTH TWICE DAILY   . aspirin EC 81 MG tablet Take 1 tablet (81 mg total) by mouth daily.   Marland Kitchen atorvastatin (LIPITOR) 10 MG tablet TAKE ONE TABLET BY MOUTH DAILY   . Calcium Carbonate-Vit D-Min (CALCIUM 600+D PLUS MINERALS) 600-400 MG-UNIT CHEW Chew 1 tablet by mouth 3 (three) times daily.    . cyanocobalamin (,VITAMIN B-12,) 1000 MCG/ML injection Inject 1,000 mcg as directed every 30 (thirty) days.   Marland Kitchen dexamethasone (DECADRON) 2 MG tablet Take five tablets  ('10mg'$ ) by mouth weekly. (Patient taking differently: Take five tablets ('10mg'$ ) by mouth bi-weekly.) 12/26/2018: [facility] instructed patient to take medication weekly  . diltiazem (CARDIZEM CD) 240 MG 24 hr capsule Take 1 capsule (240 mg total) by mouth daily.   Marland Kitchen doxycycline (VIBRA-TABS) 100 MG tablet TAKE ONE TABLET BY MOUTH TWICE DAILY FOR 10 DAYS.   Marland Kitchen losartan (COZAAR) 50 MG tablet TAKE ONE TABLET BY MOUTH DAILY   . magnesium oxide (MAG-OX) 400 MG tablet TAKE ONE TABLET BY MOUTH TWICE DAILY. WHEN TAKING LASIX   . metoprolol tartrate (LOPRESSOR) 25 MG tablet TAKE ONE TABLET BY MOUTH TWICE  DAILY   . NINLARO 2.3 MG capsule Take 2.3 mg by mouth. Pt takes three times a month   . pantoprazole (PROTONIX) 40 MG tablet Take 40 mg by mouth as needed.    . pomalidomide (POMALYST) 2 MG capsule Take 1 capsule (2 mg total) by mouth daily. Take with water on days 1-21. Repeat every 28 days.   . potassium chloride (K-DUR) 10 MEQ tablet TAKE ONE TABLET BY MOUTH THREE TIMES DAILY.   . diazepam (VALIUM) 5 MG tablet TAKE ONE TABLET BY MOUTH EVERY 6 HOURS AS NEEDED FOR ANXIETY (Patient not taking: Reported on 03/04/2019)   . fluticasone (FLONASE) 50 MCG/ACT nasal spray Place 2 sprays into both nostrils as needed.    . furosemide (LASIX) 20 MG tablet Take 0.5 tablets (10 mg total) by mouth daily as needed (as needed for ankle swelling). (Patient not taking: Reported on 03/04/2019)   . guaiFENesin (MUCINEX) 600 MG 12 hr tablet Take 600 mg 2 (two) times daily as needed by mouth for cough or to loosen phlegm.   . ondansetron (ZOFRAN) 8 MG tablet Take 1 tablet (8 mg total) by mouth every 8 (eight) hours as needed for nausea or vomiting. (Patient not taking: Reported on 03/04/2019)   . umeclidinium-vilanterol (ANORO ELLIPTA) 62.5-25 MCG/INH AEPB Inhale 1 puff into the lungs daily.   . [DISCONTINUED] dexamethasone (DECADRON) 4 MG tablet TAKE TWO & ONE-HALF (2 & 1/2) TABLETS (50 MG) BY MOUTH EVERY WEEK.   . [DISCONTINUED]  umeclidinium-vilanterol (ANORO ELLIPTA) 62.5-25 MCG/INH AEPB Inhale into the lungs.    No facility-administered encounter medications on file as of 03/25/2019.     ALLERGIES:  Allergies  Allergen Reactions  . Lisinopril Cough  . Plavix [Clopidogrel Bisulfate] Swelling     PHYSICAL EXAM:  ECOG Performance status: 1  Vitals:   03/25/19 1137  BP: (!) 118/59  Pulse: 95  Resp: (!) 21  Temp: (!) 97.3 F (36.3 C)  SpO2: 95%   There were no vitals filed for this visit.  Physical Exam Vitals signs reviewed.  Constitutional:      Appearance: Normal appearance.  Cardiovascular:     Rate and Rhythm: Normal rate and regular rhythm.     Heart sounds: Normal heart sounds.  Pulmonary:     Effort: Pulmonary effort is normal.     Breath sounds: Normal breath sounds.  Abdominal:     General: There is no distension.     Palpations: Abdomen is soft. There is no mass.  Musculoskeletal:        General: No swelling.  Skin:    General: Skin is warm.  Neurological:     General: No focal deficit present.     Mental Status: She is alert and oriented to person, place, and time.  Psychiatric:        Mood and Affect: Mood normal.        Behavior: Behavior normal.      LABORATORY DATA:  I have reviewed the labs as listed.  CBC    Component Value Date/Time   WBC 7.8 03/20/2019 1207   RBC 3.45 (L) 03/20/2019 1207   HGB 10.8 (L) 03/20/2019 1207   HCT 36.0 03/20/2019 1207   PLT 232 03/20/2019 1207   MCV 104.3 (H) 03/20/2019 1207   MCH 31.3 03/20/2019 1207   MCHC 30.0 03/20/2019 1207   RDW 21.1 (H) 03/20/2019 1207   LYMPHSABS 0.9 03/20/2019 1207   MONOABS 1.7 (H) 03/20/2019 1207   EOSABS 0.3 03/20/2019 1207  BASOSABS 0.1 03/20/2019 1207   CMP Latest Ref Rng & Units 03/20/2019 03/04/2019 02/04/2019  Glucose 70 - 99 mg/dL 117(H) 132(H) 101(H)  BUN 8 - 23 mg/dL 30(H) 29(H) 27(H)  Creatinine 0.44 - 1.00 mg/dL 1.22(H) 1.26(H) 1.24(H)  Sodium 135 - 145 mmol/L 140 139 140  Potassium  3.5 - 5.1 mmol/L 3.8 3.4(L) 4.0  Chloride 98 - 111 mmol/L 108 105 107  CO2 22 - 32 mmol/L _0 Calcium 8.9 - 10.3 mg/dL 8.5(L) 8.6(L) 8.1(L)  Total Protein 6.5 - 8.1 g/dL 6.0(L) 6.2(L) 6.2(L)  Total Bilirubin 0.3 - 1.2 mg/dL 0.5 0.5 0.6  Alkaline Phos 38 - 126 U/L 71 72 68  AST 15 - 41 U/L 20 16 13(L)  ALT 0 - 44 U/L _1 DIAGNOSTIC IMAGING:  I have independently reviewed the scans and discussed with the patient.   I have reviewed Venita Lick LPN's note and agree with the documentation.  I personally performed a face-to-face visit, made revisions and my assessment and plan is as follows.    ASSESSMENT & PLAN:   Multiple myeloma ([place]) 1.  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 -Revlimid and dexamethasone from February 2017 through 05/2016, achieving negative SPEP and immunofixation. - Bone marrow biopsy on 11/14/2016 shows less than 2% plasma cells, started maintenance Velcade on 11/2016 through 07/2017, stopped due to diarrhea. -Noted to have a biochemical relapse with M spike of 0.5, started on reduced dose of Pomalyst, Ninlaro and dexamethasone on 08/24/2017.  She takes dexamethasone 10 mg every other week. - Myeloma blood work from 19 2020 shows M spike of 0.5.  Free light chain ratio has gone up to 10.96. - I have recommended continuing same regimen at this time.  I will reevaluate her in 4 weeks.  If it continues to get worse, we will consider changing therapy.  2.  Left orbit pain: - MRI of the orbits on 03/20/2018 was negative. -She reported some vision problems also.  I have recommended evaluation by ophthalmology.  3.  Bone protection: -She is continuing Zometa 3 mg every 3 months.  4.  Normocytic anemia: -This is from combination of CKD and diarrhea. - She received Feraheme on  03/09/2028 at 2020.  She will continue Aranesp every 2 weeks.  Total time spent is 25 minutes with more than 50% of the time spent face-to-face discussing treatment plan and coordination of care.    Orders placed this encounter:  Orders Placed This Encounter  Procedures  . Protein electrophoresis, serum  . Kappa/lambda light chains  . Lactate dehydrogenase  . CBC with Differential/Platelet  . Comprehensive metabolic panel      Derek Jack, MD [place] [phone]

## 2019-03-25 NOTE — Patient Instructions (Signed)
[facility] at [facility] Discharge Instructions  You were seen today by Dr. Katragadda. He went over your recent lab and scan results. He will see you back in 4 weeks for labs and follow up.   Thank you for choosing [facility] at [facility] to provide your oncology and hematology care.  To afford each patient quality time with our provider, please arrive at least 15 minutes before your scheduled appointment time.   If you have a lab appointment with the Cancer Center please come in thru the  Main Entrance and check in at the main information desk  You need to re-schedule your appointment should you arrive 10 or more minutes late.  We strive to give you quality time with our providers, and arriving late affects you and other patients whose appointments are after yours.  Also, if you no show three or more times for appointments you may be dismissed from the clinic at the providers discretion.     Again, thank you for choosing [facility].  Our hope is that these requests will decrease the amount of time that you wait before being seen by our physicians.       _____________________________________________________________  Should you have questions after your visit to [facility], please contact our office at [phone] between the hours of 8:00 a.m. and 4:30 p.m.  Voicemails left after 4:00 p.m. will not be returned until the following business day.  For prescription refill requests, have your pharmacy contact our office and allow 72 hours.    Cancer Center Support Programs:   > Cancer Support Group  2nd Tuesday of the month 1pm-2pm, Journey Room    

## 2019-03-26 ENCOUNTER — Telehealth (HOSPITAL_COMMUNITY): Payer: Self-pay | Admitting: Surgery

## 2019-03-26 NOTE — Telephone Encounter (Signed)
Pt had left a voicemail saying that she thought she was supposed to receive a rescue inhaler to help with her shortness of breath.  She stated that she picked up the anoro ellipta inhaler from her pharmacy.    Per Dr. Delton Coombes, he wanted the pt to try the anoro ellipta inhaler first before prescribing a rescue inhaler.  Will try to call pt back later today.

## 2019-03-26 NOTE — Telephone Encounter (Signed)
I left a voicemail telling the pt that Dr. Delton Coombes wants her to try the anoro ellipta first before prescribing a rescue inhaler.  I told her to call our office back if she has any questions.

## 2019-04-02 ENCOUNTER — Other Ambulatory Visit (HOSPITAL_COMMUNITY): Payer: Self-pay | Admitting: Hematology

## 2019-04-02 ENCOUNTER — Other Ambulatory Visit (HOSPITAL_COMMUNITY): Payer: Self-pay | Admitting: *Deleted

## 2019-04-02 DIAGNOSIS — D631 Anemia in chronic kidney disease: Secondary | ICD-10-CM

## 2019-04-02 DIAGNOSIS — E538 Deficiency of other specified B group vitamins: Secondary | ICD-10-CM

## 2019-04-02 DIAGNOSIS — D509 Iron deficiency anemia, unspecified: Secondary | ICD-10-CM

## 2019-04-02 DIAGNOSIS — N183 Chronic kidney disease, stage 3 unspecified: Secondary | ICD-10-CM

## 2019-04-02 DIAGNOSIS — C9000 Multiple myeloma not having achieved remission: Secondary | ICD-10-CM

## 2019-04-03 ENCOUNTER — Inpatient Hospital Stay (HOSPITAL_COMMUNITY): Payer: Medicare Other

## 2019-04-03 ENCOUNTER — Other Ambulatory Visit: Payer: Self-pay

## 2019-04-03 DIAGNOSIS — C9000 Multiple myeloma not having achieved remission: Secondary | ICD-10-CM | POA: Diagnosis not present

## 2019-04-03 DIAGNOSIS — D631 Anemia in chronic kidney disease: Secondary | ICD-10-CM

## 2019-04-03 DIAGNOSIS — D509 Iron deficiency anemia, unspecified: Secondary | ICD-10-CM

## 2019-04-03 DIAGNOSIS — E538 Deficiency of other specified B group vitamins: Secondary | ICD-10-CM

## 2019-04-03 LAB — CBC
HCT: 38.5 % (ref 36.0–46.0)
Hemoglobin: 11.7 g/dL — ABNORMAL LOW (ref 12.0–15.0)
MCH: 32 pg (ref 26.0–34.0)
MCHC: 30.4 g/dL (ref 30.0–36.0)
MCV: 105.2 fL — ABNORMAL HIGH (ref 80.0–100.0)
Platelets: 177 10*3/uL (ref 150–400)
RBC: 3.66 MIL/uL — ABNORMAL LOW (ref 3.87–5.11)
RDW: 21.2 % — ABNORMAL HIGH (ref 11.5–15.5)
WBC: 5.5 10*3/uL (ref 4.0–10.5)
nRBC: 0 % (ref 0.0–0.2)

## 2019-04-03 NOTE — Progress Notes (Signed)
Carol Bradley Date of Birth: 1940-12-26 Medical Record #[id number]  History of Present Illness: Carol Bradley is seen for follow up CAD.   She has a history of COPD and mild pulmonary hypertension. She also is a history of coronary disease and is status post stenting of the mid to distal RCA in October 2012. Her presenting symptoms were of dyspnea and not chest pain. She also has labile hypertension. She developed a cough on ACE inhibitors. She developed hyponatremia on HCTZ.   She is being treated for myeloma with maintenance chemo. She has required periodic iron infusions for anemia. Her last visit in June 2019 she complained of dyspnea. Echo and Myoview studies were unremarkable.   She was seen in June 2020 by Jory Sims FNP. Noted some fluid retention. Given lasix to take PRN. This did seem to help.  Echo repeated and was unchanged.   On follow up today she reports she is doing OK. She is quite out of breath with activity. She has gained weight but is on more steroids now. She has gained 12 lbs since January. She does note that when she takes lasix her breathing is a little better. BP has been running low.    Current Outpatient Medications on File Prior to Visit  Medication Sig Dispense Refill  . acyclovir (ZOVIRAX) 200 MG capsule TAKE ONE CAPSULE BY MOUTH TWICE DAILY 180 capsule 1  . aspirin EC 81 MG tablet Take 1 tablet (81 mg total) by mouth daily. 90 tablet 3  . atorvastatin (LIPITOR) 10 MG tablet TAKE ONE TABLET BY MOUTH DAILY 90 tablet 3  . Calcium Carbonate-Vit D-Min (CALCIUM 600+D PLUS MINERALS) 600-400 MG-UNIT CHEW Chew 1 tablet by mouth 3 (three) times daily.     . cyanocobalamin (,VITAMIN B-12,) 1000 MCG/ML injection Inject 1,000 mcg as directed every 30 (thirty) days.    Marland Kitchen dexamethasone (DECADRON) 2 MG tablet Take five tablets (68m) by mouth weekly. (Patient taking differently: Take five tablets (122m by mouth bi-weekly.) 20 tablet 11  . diazepam (VALIUM) 5 MG tablet TAKE  ONE TABLET BY MOUTH EVERY 6 HOURS AS NEEDED FOR ANXIETY 30 tablet 1  . diltiazem (CARDIZEM CD) 240 MG 24 hr capsule Take 1 capsule (240 mg total) by mouth daily. 90 capsule 3  . doxycycline (VIBRA-TABS) 100 MG tablet TAKE ONE TABLET BY MOUTH TWICE DAILY FOR 10 DAYS.    . fluticasone (FLONASE) 50 MCG/ACT nasal spray Place 2 sprays into both nostrils as needed.     . furosemide (LASIX) 20 MG tablet Take 0.5 tablets (10 mg total) by mouth daily as needed (as needed for ankle swelling). 30 tablet 0  . guaiFENesin (MUCINEX) 600 MG 12 hr tablet Take 600 mg 2 (two) times daily as needed by mouth for cough or to loosen phlegm.    . magnesium oxide (MAG-OX) 400 MG tablet TAKE ONE TABLET BY MOUTH TWICE DAILY. WHEN TAKING LASIX 60 tablet 2  . metoprolol tartrate (LOPRESSOR) 25 MG tablet TAKE ONE TABLET BY MOUTH TWICE DAILY 180 tablet 3  . NINLARO 2.3 MG capsule Take 2.3 mg by mouth. Pt takes three times a month    . ondansetron (ZOFRAN) 8 MG tablet Take 1 tablet (8 mg total) by mouth every 8 (eight) hours as needed for nausea or vomiting. 30 tablet 2  . pantoprazole (PROTONIX) 40 MG tablet Take 40 mg by mouth as needed.     . Marland KitchenOMALYST 2 MG capsule Take 1 capsule (2 mg total) by mouth  daily. Take with water on days 1-21. Repeat every 28 days. 21 capsule 0  . potassium chloride (K-DUR) 10 MEQ tablet TAKE ONE TABLET BY MOUTH THREE TIMES DAILY. 90 tablet 4  . umeclidinium-vilanterol (ANORO ELLIPTA) 62.5-25 MCG/INH AEPB Inhale 1 puff into the lungs daily. 60 each 1   No current facility-administered medications on file prior to visit.     Allergies  Allergen Reactions  . Lisinopril Cough  . Plavix [Clopidogrel Bisulfate] Swelling    Past Medical History:  Diagnosis Date  . Anemia associated with stage 3 chronic renal failure ([place]) 04/13/2016  . CAD (coronary artery disease)   . COPD (chronic obstructive pulmonary disease) ([place])   . History of tobacco abuse   . Hyperlipidemia   . Hypertension   .  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 Multiple myeloma ([place])   . Vitamin B12 deficiency 04/15/2016   Overview:  Vitamin B12 level documented 155, January 2016 with the normal range being 211-924    Past Surgical History:  Procedure Laterality Date  . BACK SURGERY    . CARDIAC CATHETERIZATION  04/2011   right and left cath showing normal right heart pressures,but newly diagnosed coronary artery disease/drug eluting stent placed to RCA with residual disease in the proximal RCA and LAD and ramus, normal LV function and 60-65% EF  . COLONOSCOPY N/A 09/30/2014   Procedure: COLONOSCOPY;  Surgeon: Rogene Houston, MD;  Location: [facility];  Service: Endoscopy;  Laterality: N/A;  225  . CORONARY ANGIOPLASTY WITH STENT PLACEMENT  04/2011   mid RCA: 3.0 X38 mm Promus DES. Residual 40% disease proximally  . NECK SURGERY      Social History   Tobacco Use  Smoking Status Former Smoker  . Packs/day: 3.00  . Years: 25.00  . Pack years: 75.00  . Types: Cigarettes  . Quit date: 07/10/1994  . Years since quitting: 24.7  Smokeless Tobacco Never Used    Social History   Substance and Sexual Activity  Alcohol Use No    Family History  Problem Relation Age of Onset  . Heart failure Mother   . Cancer Father     Review of Systems: As noted in HPI.  All other systems were reviewed and are negative.  Physical Exam: BP 120/63   Pulse 81   Temp (!) 96.9 F (36.1 C)   Ht _0  (1.702 m)   Wt 169 lb (76.7 kg)   SpO2 97%   BMI 26.47 kg/m   GENERAL:  Well appearing WF appears SOB at rest. HEENT:  PERRL, EOMI, sclera are clear. Oropharynx is clear. NECK:  No jugular venous distention, carotid upstroke brisk and symmetric, no bruits, no thyromegaly or adenopathy LUNGS:  Clear to auscultation bilaterally CHEST:  Unremarkable HEART:  RRR,  PMI not displaced or sustained,S1 and S2 within normal limits, no S3, no S4: no clicks, no rubs, no murmurs ABD:  Soft, nontender. BS +, no masses or  bruits. No hepatomegaly, no splenomegaly EXT:  2 + pulses throughout, no edema, no cyanosis no clubbing SKIN:  Warm and dry.  No rashes NEURO:  Alert and oriented x 3. Cranial nerves II through XII intact. PSYCH:  Cognitively intact        LABORATORY DATA: Lab Results  Component Value Date   WBC 5.5 04/03/2019   HGB 11.7 (L) 04/03/2019   HCT 38.5 04/03/2019   PLT 177 04/03/2019   GLUCOSE 117 (H) 03/20/2019   CHOL 133 12/24/2017   TRIG 74  12/24/2017   HDL 45 12/24/2017   LDLCALC 73 12/24/2017   ALT 18 03/20/2019   AST 20 03/20/2019   NA 140 03/20/2019   K 3.8 03/20/2019   CL 108 03/20/2019   CREATININE 1.22 (H) 03/20/2019   BUN 30 (H) 03/20/2019   CO2 24 03/20/2019   INR 1.04 05/05/2011    Echo: 6/20/19Study Conclusions  - Left ventricle: The cavity size was normal. Wall thickness was   increased in a pattern of moderate LVH. Systolic function was   normal. The estimated ejection fraction was in the range of 60%   to 65%. Wall motion was normal; there were no regional wall   motion abnormalities. Doppler parameters are consistent with   abnormal left ventricular relaxation (grade 1 diastolic   dysfunction). - Aortic valve: Trileaflet; mildly thickened, mildly calcified   leaflets. There was mild regurgitation. - Mitral valve: Calcified annulus. There was mild regurgitation.   Valve area by pressure half-time: 1.62 cm^2. - Tricuspid valve: There was mild regurgitation. - Pulmonary arteries: Systolic pressure was mildly increased. PA   peak pressure: 41 mm Hg (S).   Myoview 01/01/18:  Study Highlights     Nuclear stress EF: 72%. No wall motion abnormalities  No evidence of significant perfusion defects. No TID (transient ischemic dilatation). No infarct or ischemia identified. During Lexiscan infusion, diffuse mild, 1 mm ST segment depression noted.  This is a low risk study. There is not appear to be any ischemia identified. No ischemia in the previously  placed RCA stent territory.   Candee Furbish, MD   Echo 01/09/19: IMPRESSIONS    1. The left ventricle has normal systolic function with an ejection fraction of 60-65%. The cavity size was normal. There is moderately increased left ventricular wall thickness. Left ventricular diastolic Doppler parameters are consistent with impaired  relaxation.  2. Left atrial size was mildly dilated.  3. The mitral valve is abnormal. Mild calcification of the mitral valve leaflet. There is mild mitral annular calcification present. Mitral valve regurgitation is mild to moderate by color flow Doppler.  4. The aortic valve is tricuspid. Aortic valve regurgitation is trivial by color flow Doppler. No stenosis of the aortic valve.  5. Normal LV systolic function; mild diastolic dysfunction; moderate LVH; sclerotic aortic valve with trace AI; mild to moderate MR; mild LAE.  Assessment / Plan: 1. Coronary disease status post stenting of the RCA in October of 2012 with a drug-eluting stent.   Normal  stress Myoview study in June 2019. No angina.  2. Hypercholesterolemia. Continue Lipitor therapy. LDL is well controlled.   3. Dyspnea. May be more related to weight gain and COPD. ? A component of diastolic dysfunction. Will have her take lasix daily. Stop losartan to avoid hypotension. She has lab scheduled for next week.  4. Hypertension-  See above.   5. CKD stage 3.   6. Multiple myeloma- per oncology. On maintenance chemotherapy  7. Chronic anemia secondary to #6-

## 2019-04-03 NOTE — Progress Notes (Signed)
Hgb 11.7 today. Discharged from clinic ambulatory. F/U with [facility] as scheduled.

## 2019-04-07 ENCOUNTER — Ambulatory Visit (INDEPENDENT_AMBULATORY_CARE_PROVIDER_SITE_OTHER): Payer: Medicare Other | Admitting: Cardiology

## 2019-04-07 ENCOUNTER — Encounter: Payer: Self-pay | Admitting: Cardiology

## 2019-04-07 ENCOUNTER — Other Ambulatory Visit: Payer: Self-pay

## 2019-04-07 VITALS — BP 120/63 | HR 81 | Temp 96.9°F | Ht 67.0 in | Wt 169.0 lb

## 2019-04-07 DIAGNOSIS — I10 Essential (primary) hypertension: Secondary | ICD-10-CM

## 2019-04-07 DIAGNOSIS — R0609 Other forms of dyspnea: Secondary | ICD-10-CM | POA: Diagnosis not present

## 2019-04-07 DIAGNOSIS — E785 Hyperlipidemia, unspecified: Secondary | ICD-10-CM

## 2019-04-07 DIAGNOSIS — I25119 Atherosclerotic heart disease of native coronary artery with unspecified angina pectoris: Secondary | ICD-10-CM | POA: Diagnosis not present

## 2019-04-07 MED ORDER — FUROSEMIDE 20 MG PO TABS: 20.0000 mg | ORAL_TABLET | Freq: Every day | ORAL | 3 refills | Status: DC | PRN

## 2019-04-07 NOTE — Patient Instructions (Addendum)
Take lasix 20 mg daily  Stop taking losartan  Follow up in 3 months

## 2019-04-17 ENCOUNTER — Inpatient Hospital Stay (HOSPITAL_COMMUNITY): Payer: Medicare Other

## 2019-04-17 ENCOUNTER — Other Ambulatory Visit: Payer: Self-pay

## 2019-04-17 ENCOUNTER — Inpatient Hospital Stay (HOSPITAL_COMMUNITY): Payer: Medicare Other | Attending: Hematology

## 2019-04-17 ENCOUNTER — Encounter (HOSPITAL_COMMUNITY): Payer: Self-pay

## 2019-04-17 DIAGNOSIS — D631 Anemia in chronic kidney disease: Secondary | ICD-10-CM | POA: Insufficient documentation

## 2019-04-17 DIAGNOSIS — C9000 Multiple myeloma not having achieved remission: Secondary | ICD-10-CM | POA: Insufficient documentation

## 2019-04-17 DIAGNOSIS — Z23 Encounter for immunization: Secondary | ICD-10-CM | POA: Insufficient documentation

## 2019-04-17 DIAGNOSIS — N183 Chronic kidney disease, stage 3 unspecified: Secondary | ICD-10-CM

## 2019-04-17 DIAGNOSIS — E538 Deficiency of other specified B group vitamins: Secondary | ICD-10-CM | POA: Diagnosis not present

## 2019-04-17 DIAGNOSIS — D509 Iron deficiency anemia, unspecified: Secondary | ICD-10-CM

## 2019-04-17 LAB — CBC
HCT: 36.4 % (ref 36.0–46.0)
Hemoglobin: 11.1 g/dL — ABNORMAL LOW (ref 12.0–15.0)
MCH: 32.3 pg (ref 26.0–34.0)
MCHC: 30.5 g/dL (ref 30.0–36.0)
MCV: 105.8 fL — ABNORMAL HIGH (ref 80.0–100.0)
Platelets: 235 10*3/uL (ref 150–400)
RBC: 3.44 MIL/uL — ABNORMAL LOW (ref 3.87–5.11)
RDW: 20.4 % — ABNORMAL HIGH (ref 11.5–15.5)
WBC: 7.1 10*3/uL (ref 4.0–10.5)
nRBC: 0 % (ref 0.0–0.2)

## 2019-04-17 MED ORDER — INFLUENZA VAC A&B SA ADJ QUAD 0.5 ML IM PRSY
0.5000 mL | PREFILLED_SYRINGE | Freq: Once | INTRAMUSCULAR | Status: AC
  Administered 2019-04-17: 13:00:00 0.5 mL via INTRAMUSCULAR
  Filled 2019-04-17: qty 0.5

## 2019-04-17 NOTE — Progress Notes (Signed)
Pt's hemoglobin 11.1 today-Aranesp held due to parameters.  Pt verbalized understanding and told to return for her next appointment.

## 2019-04-29 ENCOUNTER — Other Ambulatory Visit (HOSPITAL_COMMUNITY): Payer: Self-pay | Admitting: *Deleted

## 2019-04-29 DIAGNOSIS — C9000 Multiple myeloma not having achieved remission: Secondary | ICD-10-CM

## 2019-04-29 MED ORDER — POMALIDOMIDE 2 MG PO CAPS: ORAL_CAPSULE | ORAL | 0 refills | Status: DC

## 2019-05-01 ENCOUNTER — Inpatient Hospital Stay (HOSPITAL_COMMUNITY): Payer: Medicare Other

## 2019-05-01 ENCOUNTER — Other Ambulatory Visit (HOSPITAL_COMMUNITY): Payer: Self-pay | Admitting: Hematology

## 2019-05-01 ENCOUNTER — Inpatient Hospital Stay (HOSPITAL_BASED_OUTPATIENT_CLINIC_OR_DEPARTMENT_OTHER): Payer: Medicare Other | Admitting: Hematology

## 2019-05-01 ENCOUNTER — Encounter (HOSPITAL_COMMUNITY): Payer: Self-pay | Admitting: Hematology

## 2019-05-01 ENCOUNTER — Other Ambulatory Visit: Payer: Self-pay

## 2019-05-01 VITALS — BP 137/63 | HR 79 | Temp 97.4°F | Resp 16 | Wt 174.2 lb

## 2019-05-01 DIAGNOSIS — C9000 Multiple myeloma not having achieved remission: Secondary | ICD-10-CM

## 2019-05-01 DIAGNOSIS — D801 Nonfamilial hypogammaglobulinemia: Secondary | ICD-10-CM

## 2019-05-01 DIAGNOSIS — D509 Iron deficiency anemia, unspecified: Secondary | ICD-10-CM

## 2019-05-01 DIAGNOSIS — D631 Anemia in chronic kidney disease: Secondary | ICD-10-CM

## 2019-05-01 DIAGNOSIS — E538 Deficiency of other specified B group vitamins: Secondary | ICD-10-CM

## 2019-05-01 DIAGNOSIS — N183 Chronic kidney disease, stage 3 unspecified: Secondary | ICD-10-CM

## 2019-05-01 LAB — COMPREHENSIVE METABOLIC PANEL
ALT: 18 U/L (ref 0–44)
AST: 20 U/L (ref 15–41)
Albumin: 3.1 g/dL — ABNORMAL LOW (ref 3.5–5.0)
Alkaline Phosphatase: 78 U/L (ref 38–126)
Anion gap: 10 (ref 5–15)
BUN: 22 mg/dL (ref 8–23)
CO2: 25 mmol/L (ref 22–32)
Calcium: 8.1 mg/dL — ABNORMAL LOW (ref 8.9–10.3)
Chloride: 107 mmol/L (ref 98–111)
Creatinine, Ser: 1.24 mg/dL — ABNORMAL HIGH (ref 0.44–1.00)
GFR calc Af Amer: 48 mL/min — ABNORMAL LOW (ref >60)
GFR calc non Af Amer: 42 mL/min — ABNORMAL LOW (ref >60)
Glucose, Bld: 103 mg/dL — ABNORMAL HIGH (ref 70–99)
Potassium: 3.7 mmol/L (ref 3.5–5.1)
Sodium: 142 mmol/L (ref 135–145)
Total Bilirubin: 0.7 mg/dL (ref 0.3–1.2)
Total Protein: 6.5 g/dL (ref 6.5–8.1)

## 2019-05-01 LAB — CBC WITH DIFFERENTIAL/PLATELET
Abs Immature Granulocytes: 0.02 10*3/uL (ref 0.00–0.07)
Basophils Absolute: 0.1 10*3/uL (ref 0.0–0.1)
Basophils Relative: 1 %
Eosinophils Absolute: 0.3 10*3/uL (ref 0.0–0.5)
Eosinophils Relative: 6 %
HCT: 30.9 % — ABNORMAL LOW (ref 36.0–46.0)
Hemoglobin: 9.6 g/dL — ABNORMAL LOW (ref 12.0–15.0)
Immature Granulocytes: 0 %
Lymphocytes Relative: 17 %
Lymphs Abs: 0.9 10*3/uL (ref 0.7–4.0)
MCH: 33.4 pg (ref 26.0–34.0)
MCHC: 31.1 g/dL (ref 30.0–36.0)
MCV: 107.7 fL — ABNORMAL HIGH (ref 80.0–100.0)
Monocytes Absolute: 1.3 10*3/uL — ABNORMAL HIGH (ref 0.1–1.0)
Monocytes Relative: 24 %
Neutro Abs: 2.7 10*3/uL (ref 1.7–7.7)
Neutrophils Relative %: 52 %
Platelets: 165 10*3/uL (ref 150–400)
RBC: 2.87 MIL/uL — ABNORMAL LOW (ref 3.87–5.11)
RDW: 19.9 % — ABNORMAL HIGH (ref 11.5–15.5)
WBC: 5.2 10*3/uL (ref 4.0–10.5)
nRBC: 0 % (ref 0.0–0.2)

## 2019-05-01 LAB — LACTATE DEHYDROGENASE: LDH: 178 U/L (ref 98–192)

## 2019-05-01 MED ORDER — CYANOCOBALAMIN 1000 MCG/ML IJ SOLN
1000.0000 ug | Freq: Once | INTRAMUSCULAR | Status: AC
  Administered 2019-05-01: 1000 ug via INTRAMUSCULAR

## 2019-05-01 MED ORDER — DARBEPOETIN ALFA 300 MCG/0.6ML IJ SOSY
300.0000 ug | PREFILLED_SYRINGE | Freq: Once | INTRAMUSCULAR | Status: AC
  Administered 2019-05-01: 12:00:00 300 ug via SUBCUTANEOUS
  Filled 2019-05-01: qty 0.6

## 2019-05-01 NOTE — Patient Instructions (Addendum)
[place] at [facility] Discharge Instructions  You were seen today by Dr. Delton Coombes. He went over your recent lab results. He will see you back in 4 weeks for labs and follow up. He would like you to start taking two fluids pills on the days you feel like there is mor swelling in your feet.  Thank you for choosing [place] at [facility] to provide your oncology and hematology care.  To afford each patient quality time with our provider, please arrive at least 15 minutes before your scheduled appointment time.   If you have a lab appointment with the [facility] please come in thru the  Main Entrance and check in at the main information desk  You need to re-schedule your appointment should you arrive 10 or more minutes late.  We strive to give you quality time with our providers, and arriving late affects you and other patients whose appointments are after yours.  Also, if you no show three or more times for appointments you may be dismissed from the clinic at the providers discretion.     Again, thank you for choosing [facility].  Our hope is that these requests will decrease the amount of time that you wait before being seen by our physicians.       _____________________________________________________________  Should you have questions after your visit to [facility], please contact our office at [phone] between the hours of 8:00 a.m. and 4:30 p.m.  Voicemails left after 4:00 p.m. will not be returned until the following business day.  For prescription refill requests, have your pharmacy contact our office and allow 72 hours.    Cancer Center Support Programs:   > Cancer Support Group  2nd Tuesday of the month 1pm-2pm, Journey Room

## 2019-05-01 NOTE — Patient Instructions (Signed)
[place] at [facility] Discharge Instructions  Received Vit B12 and Aranesp injections today. Follow-up as scheduled. Call clinic for any questions or concerns   Thank you for choosing [place] at [facility] to provide your oncology and hematology care.  To afford each patient quality time with our provider, please arrive at least 15 minutes before your scheduled appointment time.   If you have a lab appointment with the [facility] please come in thru the Main Entrance and check in at the main information desk.  You need to re-schedule your appointment should you arrive 10 or more minutes late.  We strive to give you quality time with our providers, and arriving late affects you and other patients whose appointments are after yours.  Also, if you no show three or more times for appointments you may be dismissed from the clinic at the providers discretion.     Again, thank you for choosing [facility].  Our hope is that these requests will decrease the amount of time that you wait before being seen by our physicians.       _____________________________________________________________  Should you have questions after your visit to [facility], please contact our office at [phone] between the hours of 8:00 a.m. and 4:30 p.m.  Voicemails left after 4:00 p.m. will not be returned until the following business day.  For prescription refill requests, have your pharmacy contact our office and allow 72 hours.    Due to Covid, you will need to wear a mask upon entering the hospital. If you do not have a mask, a mask will be given to you at the Main Entrance upon arrival. For doctor visits, patients may have 1 support person with them. For treatment visits, patients can not have anyone with them due to social distancing guidelines and our immunocompromised population.

## 2019-05-01 NOTE — Progress Notes (Signed)
Carol Bradley tolerated Aranesp and Vit B12 injections well without complaints or incident after being seen by Dr. Delton Coombes with labs reviewed. Hgb 9.6 today. VSS Pt discharged self ambulatory using her cane in satisfactory condition

## 2019-05-01 NOTE — Progress Notes (Signed)
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    03/18/2015 - 07/28/2015 Chemotherapy   Velcade 1.6 mg/m2 discontinued secondary to intolerance   07/28/2015 Adverse Reaction   stool incontinence, weakness, collapse upon standing, felt to be secondary to velcade   11/09/2015 - 12/13/2015 Chemotherapy   cytoxan IV 300 mg/m2 administered X 2 doses only in addition to rev/dex   11/09/2015 - 06/08/2016 Chemotherapy   Revlimid/Dexamethasone    02/05/2017 - 07/09/2017 Chemotherapy   The patient had bortezomib SQ (VELCADE) chemo injection 1.75 mg, 1 mg/m2 = 1.75 mg (66.7 % of original dose 1.5 mg/m2), Subcutaneous,  Once, 1 of 8 cycles Dose modification: 1.5 mg/m2 (original dose 1.5 mg/m2, Cycle 1, Reason: Provider Judgment, Comment: Per Dr. Norma Fredrickson recommendations from [facility] 1 mg/m2 (original dose 1.5 mg/m2, Cycle 1, Reason: Provider Judgment)  for chemotherapy treatment.     07/09/2017 Adverse Reaction   Diarrhea    Chemotherapy   Pomalyst 2 mg (Days 1-21 every 28 days), Ixazomib 2.3 mg (days 1, 8, 15 every 28 days), and Dexamethasone 10 mg (weekly)- Rx's printed on 08/24/2017.  Treatment recommendations from Dr. Norma Fredrickson at [facility].      CANCER STAGING: Cancer Staging No matching staging information was found for the patient.   INTERVAL HISTORY:  Ms. Minch 78 y.o. female seen for follow-up of multiple myeloma.  Denies any orbital pain.  Appetite is 100%.  Energy levels are 25%.  No new pains reported.  Shortness of breath on exertion is  stable.  Numbness in the feet is also stable.  No fevers or infections reported.  No ER visits or hospitalizations.  REVIEW OF SYSTEMS:  Review of Systems  Respiratory: Positive for shortness of breath.   Neurological: Positive for numbness.  All other systems reviewed and are negative.    PAST MEDICAL/SURGICAL HISTORY:  Past Medical History:  Diagnosis Date  . 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 CAD (coronary artery disease)   . COPD (chronic obstructive pulmonary disease) ([place])   . History of tobacco abuse   . Hyperlipidemia   . Hypertension   . 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 Multiple myeloma ([place])   . Vitamin B12 deficiency 04/15/2016   Overview:  Vitamin B12 level documented 155, January 2016 with the normal range being 211-924   Past Surgical History:  Procedure Laterality Date  . BACK SURGERY    . CARDIAC CATHETERIZATION  04/2011   right and left cath showing normal right heart pressures,but newly diagnosed coronary artery disease/drug eluting stent placed to RCA with residual disease in the proximal RCA and LAD and ramus, normal LV function and 60-65% EF  . COLONOSCOPY N/A 09/30/2014   Procedure: COLONOSCOPY;  Surgeon: Rogene Houston, MD;  Location: [facility];  Service: Endoscopy;  Laterality: N/A;  225  . CORONARY ANGIOPLASTY WITH STENT PLACEMENT  04/2011   mid RCA: 3.0 X38 mm Promus DES. Residual 40% disease proximally  . NECK SURGERY       SOCIAL HISTORY:  Social History  Socioeconomic History  . Marital status: Widowed    Spouse name: Not on file  . Number of children: Not on file  . Years of education: Not on file  . Highest education level: Not on file  Occupational History  . 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 Financial resource strain: Not on file  . Food insecurity    Worry: Not on file    Inability: Not on file  . Transportation needs    Medical: Not on file     Non-medical: Not on file  Tobacco Use  . Smoking status: Former Smoker    Packs/day: 3.00    Years: 25.00    Pack years: 75.00    Types: Cigarettes    Quit date: 07/10/1994    Years since quitting: 24.8  . Smokeless tobacco: Never Used  Substance and Sexual Activity  . Alcohol use: No  . Drug use: Never  . Sexual activity: Not on file  Lifestyle  . Physical activity    Days per week: Not on file    Minutes per session: Not on file  . Stress: Not on file  Relationships  . Social Herbalist on phone: Not on file    Gets together: Not on file    Attends religious service: Not on file    Active member of club or organization: Not on file    Attends meetings of clubs or organizations: Not on file    Relationship status: Not on file  . Intimate partner violence    Fear of current or ex partner: Not on file    Emotionally abused: Not on file    Physically abused: Not on file    Forced sexual activity: Not on file  Other Topics Concern  . Not on file  Social History Narrative  . Not on file    FAMILY HISTORY:  Family History  Problem Relation Age of Onset  . Heart failure Mother   . Cancer Father     CURRENT MEDICATIONS:  Outpatient Encounter Medications as of 05/01/2019  Medication Sig Note  . acyclovir (ZOVIRAX) 200 MG capsule TAKE ONE CAPSULE BY MOUTH TWICE DAILY   . aspirin EC 81 MG tablet Take 1 tablet (81 mg total) by mouth daily.   Marland Kitchen atorvastatin (LIPITOR) 10 MG tablet TAKE ONE TABLET BY MOUTH DAILY   . Calcium Carbonate-Vit D-Min (CALCIUM 600+D PLUS MINERALS) 600-400 MG-UNIT CHEW Chew 1 tablet by mouth 3 (three) times daily.    . cyanocobalamin (,VITAMIN B-12,) 1000 MCG/ML injection Inject 1,000 mcg as directed every 30 (thirty) days.   Marland Kitchen dexamethasone (DECADRON) 2 MG tablet Take five tablets (44m) by mouth weekly. (Patient taking differently: Take five tablets (139m by mouth bi-weekly.) 12/26/2018: [facility] instructed patient to take medication weekly  .  diazepam (VALIUM) 5 MG tablet TAKE ONE TABLET BY MOUTH EVERY 6 HOURS AS NEEDED FOR ANXIETY   . diltiazem (CARDIZEM CD) 240 MG 24 hr capsule Take 1 capsule (240 mg total) by mouth daily.   . Marland Kitchenoxycycline (VIBRA-TABS) 100 MG tablet TAKE ONE TABLET BY MOUTH TWICE DAILY FOR 10 DAYS.   . fluticasone (FLONASE) 50 MCG/ACT nasal spray Place 2 sprays into both nostrils as needed.    . furosemide (LASIX) 20 MG tablet Take 1 tablet (20 mg total) by mouth daily as needed (as needed for ankle swelling).   . Marland KitchenuaiFENesin (MUCINEX) 600 MG 12 hr tablet Take 600 mg 2 (  two) times daily as needed by mouth for cough or to loosen phlegm.   . magnesium oxide (MAG-OX) 400 (241.3 Mg) MG tablet Take 1 tablet by mouth 2 (two) times daily.   . magnesium oxide (MAG-OX) 400 MG tablet TAKE ONE TABLET BY MOUTH TWICE DAILY. WHEN TAKING LASIX   . metoprolol tartrate (LOPRESSOR) 25 MG tablet TAKE ONE TABLET BY MOUTH TWICE DAILY   . ondansetron (ZOFRAN) 8 MG tablet Take 1 tablet (8 mg total) by mouth every 8 (eight) hours as needed for nausea or vomiting.   . pantoprazole (PROTONIX) 40 MG tablet Take 40 mg by mouth as needed.    . pomalidomide (POMALYST) 2 MG capsule Take 1 capsule (2 mg total) by mouth daily. Take with water on days 1-21. Repeat every 28 days.   . potassium chloride (K-DUR) 10 MEQ tablet TAKE ONE TABLET BY MOUTH THREE TIMES DAILY.   Marland Kitchen umeclidinium-vilanterol (ANORO ELLIPTA) 62.5-25 MCG/INH AEPB Inhale 1 puff into the lungs daily.   . [DISCONTINUED] NINLARO 2.3 MG capsule Take 2.3 mg by mouth. Pt takes three times a month    No facility-administered encounter medications on file as of 05/01/2019.     ALLERGIES:  Allergies  Allergen Reactions  . Lisinopril Cough  . Plavix [Clopidogrel Bisulfate] Swelling     PHYSICAL EXAM:  ECOG Performance status: 1  Vitals:   05/01/19 1000  BP: 137/63  Pulse: 79  Resp: 16  Temp: (!) 97.4 F (36.3 C)  SpO2: 96%   Filed Weights   05/01/19 1000  Weight: 174 lb 3  oz (79 kg)    Physical Exam Vitals signs reviewed.  Constitutional:      Appearance: Normal appearance.  Cardiovascular:     Rate and Rhythm: Normal rate and regular rhythm.     Heart sounds: Normal heart sounds.  Pulmonary:     Effort: Pulmonary effort is normal.     Breath sounds: Normal breath sounds.  Abdominal:     General: There is no distension.     Palpations: Abdomen is soft. There is no mass.  Musculoskeletal:        General: No swelling.  Skin:    General: Skin is warm.  Neurological:     General: No focal deficit present.     Mental Status: She is alert and oriented to person, place, and time.  Psychiatric:        Mood and Affect: Mood normal.        Behavior: Behavior normal.      LABORATORY DATA:  I have reviewed the labs as listed.  CBC    Component Value Date/Time   WBC 5.2 05/01/2019 1013   RBC 2.87 (L) 05/01/2019 1013   HGB 9.6 (L) 05/01/2019 1013   HCT 30.9 (L) 05/01/2019 1013   PLT 165 05/01/2019 1013   MCV 107.7 (H) 05/01/2019 1013   MCH 33.4 05/01/2019 1013   MCHC 31.1 05/01/2019 1013   RDW 19.9 (H) 05/01/2019 1013   LYMPHSABS 0.9 05/01/2019 1013   MONOABS 1.3 (H) 05/01/2019 1013   EOSABS 0.3 05/01/2019 1013   BASOSABS 0.1 05/01/2019 1013   CMP Latest Ref Rng & Units 05/01/2019 03/20/2019 03/04/2019  Glucose 70 - 99 mg/dL 103(H) 117(H) 132(H)  BUN 8 - 23 mg/dL 22 30(H) 29(H)  Creatinine 0.44 - 1.00 mg/dL 1.24(H) 1.22(H) 1.26(H)  Sodium 135 - 145 mmol/L 142 140 139  Potassium 3.5 - 5.1 mmol/L 3.7 3.8 3.4(L)  Chloride 98 - 111 mmol/L 107  108 105  CO2 22 - 32 mmol/L _0 Calcium 8.9 - 10.3 mg/dL 8.1(L) 8.5(L) 8.6(L)  Total Protein 6.5 - 8.1 g/dL 6.5 6.0(L) 6.2(L)  Total Bilirubin 0.3 - 1.2 mg/dL 0.7 0.5 0.5  Alkaline Phos 38 - 126 U/L 78 71 72  AST 15 - 41 U/L _1 ALT 0 - 44 U/L _2 DIAGNOSTIC IMAGING:  I have independently reviewed the scans and discussed with the patient.   I have reviewed Venita Lick  LPN's note and agree with the documentation.  I personally performed a face-to-face visit, made revisions and my assessment and plan is as follows.    ASSESSMENT & PLAN:   Multiple myeloma ([place]) 1.  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 -Revlimid and dexamethasone from February 2017 through 05/2016, achieving negative SPEP and immunofixation. - Bone marrow biopsy on 11/14/2016 shows less than 2% plasma cells, started maintenance Velcade on 11/2016 through 07/2017, stopped due to diarrhea. -Noted to have a biochemical relapse with M spike of 0.5, started on reduced dose of Pomalyst, Ninlaro and dexamethasone on 08/24/2017.  She takes dexamethasone 10 mg every other week. -Myeloma labs from 03/20/2019 shows M spike of 0.5 g/dL.  Light chain ratio also increased to 10.96. -She will continue Ninlaro, Pomalyst and dexamethasone at this time.  If it continues to go up, will consider switching therapy. -She will come back in 4 weeks for follow-up.  She reported ankle swellings.  She is taking 20 mg of Lasix daily.  Has adjusted her to increase it to 40 mg daily.  2.  Left orbit pain: - MRI of the orbits on 03/20/2018 was negative. -She reported some vision problems also.  I have recommended evaluation by ophthalmology.  3.  Bone protection: -She is continuing Zometa 3 mg every 3 months.  4.  Normocytic anemia: -This is from combination of CKD and diarrhea. - She received Feraheme on 03/09/2028 at 2020.  She will continue Aranesp every 2 weeks.  Total time spent is 25 minutes with more than 50% of the time spent face-to-face discussing treatment plan and coordination of care.    Orders placed this encounter:  Orders Placed This Encounter  Procedures  . CBC with Differential/Platelet  . Comprehensive metabolic panel       Derek Jack, MD [place] [phone]

## 2019-05-02 LAB — PROTEIN ELECTROPHORESIS, SERUM
A/G Ratio: 1.1 (ref 0.7–1.7)
Albumin ELP: 3 g/dL (ref 2.9–4.4)
Alpha-1-Globulin: 0.2 g/dL (ref 0.0–0.4)
Alpha-2-Globulin: 0.9 g/dL (ref 0.4–1.0)
Beta Globulin: 0.7 g/dL (ref 0.7–1.3)
Gamma Globulin: 0.9 g/dL (ref 0.4–1.8)
Globulin, Total: 2.8 g/dL (ref 2.2–3.9)
M-Spike, %: 0.7 g/dL — ABNORMAL HIGH
Total Protein ELP: 5.8 g/dL — ABNORMAL LOW (ref 6.0–8.5)

## 2019-05-02 LAB — KAPPA/LAMBDA LIGHT CHAINS
Kappa free light chain: 387.3 mg/L — ABNORMAL HIGH (ref 3.3–19.4)
Kappa, lambda light chain ratio: 19.86 — ABNORMAL HIGH (ref 0.26–1.65)
Lambda free light chains: 19.5 mg/L (ref 5.7–26.3)

## 2019-05-06 ENCOUNTER — Other Ambulatory Visit (HOSPITAL_COMMUNITY): Payer: Self-pay | Admitting: *Deleted

## 2019-05-06 DIAGNOSIS — C9000 Multiple myeloma not having achieved remission: Secondary | ICD-10-CM

## 2019-05-06 MED ORDER — NINLARO 2.3 MG PO CAPS: ORAL_CAPSULE | ORAL | 0 refills | Status: DC

## 2019-05-09 ENCOUNTER — Encounter (HOSPITAL_COMMUNITY): Payer: Self-pay | Admitting: Hematology

## 2019-05-09 NOTE — Assessment & Plan Note (Signed)
1.  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 -Revlimid and dexamethasone from February 2017 through 05/2016, achieving negative SPEP and immunofixation. - Bone marrow biopsy on 11/14/2016 shows less than 2% plasma cells, started maintenance Velcade on 11/2016 through 07/2017, stopped due to diarrhea. -Noted to have a biochemical relapse with M spike of 0.5, started on reduced dose of Pomalyst, Ninlaro and dexamethasone on 08/24/2017.  She takes dexamethasone 10 mg every other week. -Myeloma labs from 03/20/2019 shows M spike of 0.5 g/dL.  Light chain ratio also increased to 10.96. -She will continue Ninlaro, Pomalyst and dexamethasone at this time.  If it continues to go up, will consider switching therapy. -She will come back in 4 weeks for follow-up.  She reported ankle swellings.  She is taking 20 mg of Lasix daily.  Has adjusted her to increase it to 40 mg daily.  2.  Left orbit pain: - MRI of the orbits on 03/20/2018 was negative. -She reported some vision problems also.  I have recommended evaluation by ophthalmology.  3.  Bone protection: -She is continuing Zometa 3 mg every 3 months.  4.  Normocytic anemia: -This is from combination of CKD and diarrhea. - She received Feraheme on 03/09/2028 at 2020.  She will continue Aranesp every 2 weeks.

## 2019-05-15 ENCOUNTER — Inpatient Hospital Stay (HOSPITAL_COMMUNITY): Payer: Medicare Other | Attending: Hematology

## 2019-05-15 ENCOUNTER — Inpatient Hospital Stay (HOSPITAL_COMMUNITY): Payer: Medicare Other

## 2019-05-15 ENCOUNTER — Encounter (HOSPITAL_COMMUNITY): Payer: Self-pay

## 2019-05-15 ENCOUNTER — Other Ambulatory Visit: Payer: Self-pay

## 2019-05-15 VITALS — BP 128/56 | HR 77 | Temp 97.4°F | Resp 16

## 2019-05-15 DIAGNOSIS — Z87891 Personal history of nicotine dependence: Secondary | ICD-10-CM | POA: Insufficient documentation

## 2019-05-15 DIAGNOSIS — Z9221 Personal history of antineoplastic chemotherapy: Secondary | ICD-10-CM | POA: Diagnosis not present

## 2019-05-15 DIAGNOSIS — J449 Chronic obstructive pulmonary disease, unspecified: Secondary | ICD-10-CM | POA: Insufficient documentation

## 2019-05-15 DIAGNOSIS — N183 Chronic kidney disease, stage 3 unspecified: Secondary | ICD-10-CM | POA: Diagnosis not present

## 2019-05-15 DIAGNOSIS — D631 Anemia in chronic kidney disease: Secondary | ICD-10-CM

## 2019-05-15 DIAGNOSIS — I2510 Atherosclerotic heart disease of native coronary artery without angina pectoris: Secondary | ICD-10-CM | POA: Insufficient documentation

## 2019-05-15 DIAGNOSIS — I129 Hypertensive chronic kidney disease with stage 1 through stage 4 chronic kidney disease, or unspecified chronic kidney disease: Secondary | ICD-10-CM | POA: Insufficient documentation

## 2019-05-15 DIAGNOSIS — E538 Deficiency of other specified B group vitamins: Secondary | ICD-10-CM | POA: Diagnosis not present

## 2019-05-15 DIAGNOSIS — E785 Hyperlipidemia, unspecified: Secondary | ICD-10-CM | POA: Diagnosis not present

## 2019-05-15 DIAGNOSIS — Z79899 Other long term (current) drug therapy: Secondary | ICD-10-CM | POA: Diagnosis not present

## 2019-05-15 DIAGNOSIS — C9000 Multiple myeloma not having achieved remission: Secondary | ICD-10-CM | POA: Diagnosis not present

## 2019-05-15 DIAGNOSIS — D509 Iron deficiency anemia, unspecified: Secondary | ICD-10-CM

## 2019-05-15 DIAGNOSIS — Z7982 Long term (current) use of aspirin: Secondary | ICD-10-CM | POA: Insufficient documentation

## 2019-05-15 DIAGNOSIS — D801 Nonfamilial hypogammaglobulinemia: Secondary | ICD-10-CM

## 2019-05-15 LAB — CBC WITH DIFFERENTIAL/PLATELET
Abs Immature Granulocytes: 0.1 10*3/uL — ABNORMAL HIGH (ref 0.00–0.07)
Basophils Absolute: 0 10*3/uL (ref 0.0–0.1)
Basophils Relative: 0 %
Eosinophils Absolute: 0.1 10*3/uL (ref 0.0–0.5)
Eosinophils Relative: 2 %
HCT: 35.6 % — ABNORMAL LOW (ref 36.0–46.0)
Hemoglobin: 10.9 g/dL — ABNORMAL LOW (ref 12.0–15.0)
Immature Granulocytes: 2 %
Lymphocytes Relative: 14 %
Lymphs Abs: 1 10*3/uL (ref 0.7–4.0)
MCH: 32.9 pg (ref 26.0–34.0)
MCHC: 30.6 g/dL (ref 30.0–36.0)
MCV: 107.6 fL — ABNORMAL HIGH (ref 80.0–100.0)
Monocytes Absolute: 1.5 10*3/uL — ABNORMAL HIGH (ref 0.1–1.0)
Monocytes Relative: 23 %
Neutro Abs: 4 10*3/uL (ref 1.7–7.7)
Neutrophils Relative %: 59 %
Platelets: 245 10*3/uL (ref 150–400)
RBC: 3.31 MIL/uL — ABNORMAL LOW (ref 3.87–5.11)
RDW: 18.8 % — ABNORMAL HIGH (ref 11.5–15.5)
WBC: 6.8 10*3/uL (ref 4.0–10.5)
nRBC: 0 % (ref 0.0–0.2)

## 2019-05-15 LAB — COMPREHENSIVE METABOLIC PANEL
ALT: 12 U/L (ref 0–44)
AST: 14 U/L — ABNORMAL LOW (ref 15–41)
Albumin: 3 g/dL — ABNORMAL LOW (ref 3.5–5.0)
Alkaline Phosphatase: 69 U/L (ref 38–126)
Anion gap: 10 (ref 5–15)
BUN: 24 mg/dL — ABNORMAL HIGH (ref 8–23)
CO2: 26 mmol/L (ref 22–32)
Calcium: 8.6 mg/dL — ABNORMAL LOW (ref 8.9–10.3)
Chloride: 104 mmol/L (ref 98–111)
Creatinine, Ser: 1.35 mg/dL — ABNORMAL HIGH (ref 0.44–1.00)
GFR calc Af Amer: 43 mL/min — ABNORMAL LOW (ref >60)
GFR calc non Af Amer: 38 mL/min — ABNORMAL LOW (ref >60)
Glucose, Bld: 102 mg/dL — ABNORMAL HIGH (ref 70–99)
Potassium: 3.6 mmol/L (ref 3.5–5.1)
Sodium: 140 mmol/L (ref 135–145)
Total Bilirubin: 0.5 mg/dL (ref 0.3–1.2)
Total Protein: 6.4 g/dL — ABNORMAL LOW (ref 6.5–8.1)

## 2019-05-15 LAB — LACTATE DEHYDROGENASE: LDH: 153 U/L (ref 98–192)

## 2019-05-15 MED ORDER — DARBEPOETIN ALFA 300 MCG/0.6ML IJ SOSY
300.0000 ug | PREFILLED_SYRINGE | Freq: Once | INTRAMUSCULAR | Status: AC
  Administered 2019-05-15: 300 ug via SUBCUTANEOUS

## 2019-05-15 MED ORDER — DARBEPOETIN ALFA 300 MCG/0.6ML IJ SOSY
PREFILLED_SYRINGE | INTRAMUSCULAR | Status: AC
  Filled 2019-05-15: qty 0.6

## 2019-05-16 LAB — KAPPA/LAMBDA LIGHT CHAINS
Kappa free light chain: 445.2 mg/L — ABNORMAL HIGH (ref 3.3–19.4)
Kappa, lambda light chain ratio: 41.22 — ABNORMAL HIGH (ref 0.26–1.65)
Lambda free light chains: 10.8 mg/L (ref 5.7–26.3)

## 2019-05-17 LAB — PROTEIN ELECTROPHORESIS, SERUM
A/G Ratio: 1 (ref 0.7–1.7)
Albumin ELP: 3 g/dL (ref 2.9–4.4)
Alpha-1-Globulin: 0.3 g/dL (ref 0.0–0.4)
Alpha-2-Globulin: 0.9 g/dL (ref 0.4–1.0)
Beta Globulin: 0.8 g/dL (ref 0.7–1.3)
Gamma Globulin: 1 g/dL (ref 0.4–1.8)
Globulin, Total: 3 g/dL (ref 2.2–3.9)
M-Spike, %: 0.8 g/dL — ABNORMAL HIGH
Total Protein ELP: 6 g/dL (ref 6.0–8.5)

## 2019-05-29 ENCOUNTER — Other Ambulatory Visit (HOSPITAL_COMMUNITY): Payer: Medicare Other

## 2019-05-29 ENCOUNTER — Other Ambulatory Visit: Payer: Self-pay

## 2019-05-29 ENCOUNTER — Inpatient Hospital Stay (HOSPITAL_COMMUNITY): Payer: Medicare Other

## 2019-05-29 ENCOUNTER — Inpatient Hospital Stay (HOSPITAL_BASED_OUTPATIENT_CLINIC_OR_DEPARTMENT_OTHER): Payer: Medicare Other | Admitting: Hematology

## 2019-05-29 ENCOUNTER — Encounter (HOSPITAL_COMMUNITY): Payer: Self-pay | Admitting: Hematology

## 2019-05-29 DIAGNOSIS — E538 Deficiency of other specified B group vitamins: Secondary | ICD-10-CM

## 2019-05-29 DIAGNOSIS — C9000 Multiple myeloma not having achieved remission: Secondary | ICD-10-CM

## 2019-05-29 LAB — CBC WITH DIFFERENTIAL/PLATELET
Abs Immature Granulocytes: 0.03 10*3/uL (ref 0.00–0.07)
Basophils Absolute: 0 10*3/uL (ref 0.0–0.1)
Basophils Relative: 0 %
Eosinophils Absolute: 0.3 10*3/uL (ref 0.0–0.5)
Eosinophils Relative: 4 %
HCT: 38.9 % (ref 36.0–46.0)
Hemoglobin: 12.2 g/dL (ref 12.0–15.0)
Immature Granulocytes: 0 %
Lymphocytes Relative: 13 %
Lymphs Abs: 1 10*3/uL (ref 0.7–4.0)
MCH: 33.6 pg (ref 26.0–34.0)
MCHC: 31.4 g/dL (ref 30.0–36.0)
MCV: 107.2 fL — ABNORMAL HIGH (ref 80.0–100.0)
Monocytes Absolute: 2 10*3/uL — ABNORMAL HIGH (ref 0.1–1.0)
Monocytes Relative: 26 %
Neutro Abs: 4.4 10*3/uL (ref 1.7–7.7)
Neutrophils Relative %: 57 %
Platelets: 157 10*3/uL (ref 150–400)
RBC: 3.63 MIL/uL — ABNORMAL LOW (ref 3.87–5.11)
RDW: 17.2 % — ABNORMAL HIGH (ref 11.5–15.5)
WBC: 7.8 10*3/uL (ref 4.0–10.5)
nRBC: 0 % (ref 0.0–0.2)

## 2019-05-29 LAB — COMPREHENSIVE METABOLIC PANEL
ALT: 17 U/L (ref 0–44)
AST: 20 U/L (ref 15–41)
Albumin: 3.1 g/dL — ABNORMAL LOW (ref 3.5–5.0)
Alkaline Phosphatase: 81 U/L (ref 38–126)
Anion gap: 11 (ref 5–15)
BUN: 23 mg/dL (ref 8–23)
CO2: 25 mmol/L (ref 22–32)
Calcium: 8.4 mg/dL — ABNORMAL LOW (ref 8.9–10.3)
Chloride: 102 mmol/L (ref 98–111)
Creatinine, Ser: 1.36 mg/dL — ABNORMAL HIGH (ref 0.44–1.00)
GFR calc Af Amer: 43 mL/min — ABNORMAL LOW (ref >60)
GFR calc non Af Amer: 37 mL/min — ABNORMAL LOW (ref >60)
Glucose, Bld: 102 mg/dL — ABNORMAL HIGH (ref 70–99)
Potassium: 3.8 mmol/L (ref 3.5–5.1)
Sodium: 138 mmol/L (ref 135–145)
Total Bilirubin: 0.9 mg/dL (ref 0.3–1.2)
Total Protein: 6.8 g/dL (ref 6.5–8.1)

## 2019-05-29 MED ORDER — CYANOCOBALAMIN 1000 MCG/ML IJ SOLN
1000.0000 ug | Freq: Once | INTRAMUSCULAR | Status: AC
  Administered 2019-05-29: 1000 ug via INTRAMUSCULAR

## 2019-05-29 MED ORDER — CYANOCOBALAMIN 1000 MCG/ML IJ SOLN
INTRAMUSCULAR | Status: AC
  Filled 2019-05-29: qty 1

## 2019-05-29 NOTE — Patient Instructions (Addendum)
[place] at [facility] Discharge Instructions  You were seen today by Dr. Delton Coombes. He went over your recent lab results. Your numbers are going up slowly, the light chains and m-spike continue to increase. We will wait until you see Dr. Norma Fredrickson to decide if any of your medications need to be changed. Continue taking medications as directed. He will see you back in 4 weeks for labs and follow up.   Thank you for choosing [place] at [facility] to provide your oncology and hematology care.  To afford each patient quality time with our provider, please arrive at least 15 minutes before your scheduled appointment time.   If you have a lab appointment with the [facility] please come in thru the  Main Entrance and check in at the main information desk  You need to re-schedule your appointment should you arrive 10 or more minutes late.  We strive to give you quality time with our providers, and arriving late affects you and other patients whose appointments are after yours.  Also, if you no show three or more times for appointments you may be dismissed from the clinic at the providers discretion.     Again, thank you for choosing [facility].  Our hope is that these requests will decrease the amount of time that you wait before being seen by our physicians.       _____________________________________________________________  Should you have questions after your visit to [facility], please contact our office at [phone] between the hours of 8:00 a.m. and 4:30 p.m.  Voicemails left after 4:00 p.m. will not be returned until the following business day.  For prescription refill requests, have your pharmacy contact our office and allow 72 hours.    Cancer Center Support Programs:   > Cancer Support Group  2nd Tuesday of the month 1pm-2pm, Journey Room

## 2019-05-29 NOTE — Progress Notes (Signed)
1200 Labs reviewed with and pt seen by Dr Delton Coombes and pt approved for Vit B12 injection today with Aranesp injection to be held for Hgb 12.2.                                                                             Carol Bradley tolerated Vit B12 injection well without complaints or incident.Pt discharged via wheelchair in satisfactory condition

## 2019-05-29 NOTE — Progress Notes (Signed)
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    03/18/2015 - 07/28/2015 Chemotherapy   Velcade 1.6 mg/m2 discontinued secondary to intolerance   07/28/2015 Adverse Reaction   stool incontinence, weakness, collapse upon standing, felt to be secondary to velcade   11/09/2015 - 12/13/2015 Chemotherapy   cytoxan IV 300 mg/m2 administered X 2 doses only in addition to rev/dex   11/09/2015 - 06/08/2016 Chemotherapy   Revlimid/Dexamethasone    02/05/2017 - 07/09/2017 Chemotherapy   The patient had bortezomib SQ (VELCADE) chemo injection 1.75 mg, 1 mg/m2 = 1.75 mg (66.7 % of original dose 1.5 mg/m2), Subcutaneous,  Once, 1 of 8 cycles Dose modification: 1.5 mg/m2 (original dose 1.5 mg/m2, Cycle 1, Reason: Provider Judgment, Comment: Per Dr. Norma Fredrickson recommendations from [facility] 1 mg/m2 (original dose 1.5 mg/m2, Cycle 1, Reason: Provider Judgment)  for chemotherapy treatment.     07/09/2017 Adverse Reaction   Diarrhea    Chemotherapy   Pomalyst 2 mg (Days 1-21 every 28 days), Ixazomib 2.3 mg (days 1, 8, 15 every 28 days), and Dexamethasone 10 mg (weekly)- Rx's printed on 08/24/2017.  Treatment recommendations from Dr. Norma Fredrickson at [facility].      CANCER STAGING: Cancer Staging No matching staging information was found for the patient.   INTERVAL HISTORY:  Carol Bradley 78 y.o. female seen for follow-up of multiple myeloma.  Denies any fevers, night sweats or weight loss.  Appetite is 50%.  Energy levels are low.  Reports shortness of breath on exertion.   Intermittent leg swelling is stable.  Numbness in the feet is also stable.  Chronic constipation present.  No new onset pains.  No fevers or infections.  REVIEW OF SYSTEMS:  Review of Systems  Respiratory: Positive for shortness of breath.   Cardiovascular: Positive for leg swelling.  Gastrointestinal: Positive for constipation.  Neurological: Positive for numbness.  All other systems reviewed and are negative.    PAST MEDICAL/SURGICAL HISTORY:  Past Medical History:  Diagnosis Date  . 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 CAD (coronary artery disease)   . COPD (chronic obstructive pulmonary disease) ([place])   . History of tobacco abuse   . Hyperlipidemia   . Hypertension   . 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 Multiple myeloma ([place])   . Vitamin B12 deficiency 04/15/2016   Overview:  Vitamin B12 level documented 155, January 2016 with the normal range being 211-924   Past Surgical History:  Procedure Laterality Date  . BACK SURGERY    . CARDIAC CATHETERIZATION  04/2011   right and left cath showing normal right heart pressures,but newly diagnosed coronary artery disease/drug eluting stent placed to RCA with residual disease in the proximal RCA and LAD and ramus, normal LV function and 60-65% EF  . COLONOSCOPY N/A 09/30/2014   Procedure: COLONOSCOPY;  Surgeon: Rogene Houston, MD;  Location: [facility];  Service: Endoscopy;  Laterality: N/A;  225  . CORONARY ANGIOPLASTY WITH STENT PLACEMENT  04/2011   mid RCA: 3.0 X38 mm Promus DES. Residual 40% disease proximally  .  NECK SURGERY       SOCIAL HISTORY:  Social History   Socioeconomic History  . Marital status: Widowed    Spouse name: Not on file  . Number of children: Not on file  . Years of education: Not on file  . Highest education level: Not on file  Occupational History  . 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 Financial resource strain: Not on file   . Food insecurity    Worry: Not on file    Inability: Not on file  . Transportation needs    Medical: Not on file    Non-medical: Not on file  Tobacco Use  . Smoking status: Former Smoker    Packs/day: 3.00    Years: 25.00    Pack years: 75.00    Types: Cigarettes    Quit date: 07/10/1994    Years since quitting: 24.9  . Smokeless tobacco: Never Used  Substance and Sexual Activity  . Alcohol use: No  . Drug use: Never  . Sexual activity: Not on file  Lifestyle  . Physical activity    Days per week: Not on file    Minutes per session: Not on file  . Stress: Not on file  Relationships  . Social Herbalist on phone: Not on file    Gets together: Not on file    Attends religious service: Not on file    Active member of club or organization: Not on file    Attends meetings of clubs or organizations: Not on file    Relationship status: Not on file  . Intimate partner violence    Fear of current or ex partner: Not on file    Emotionally abused: Not on file    Physically abused: Not on file    Forced sexual activity: Not on file  Other Topics Concern  . Not on file  Social History Narrative  . Not on file    FAMILY HISTORY:  Family History  Problem Relation Age of Onset  . Heart failure Mother   . Cancer Father     CURRENT MEDICATIONS:  Outpatient Encounter Medications as of 05/29/2019  Medication Sig Note  . acyclovir (ZOVIRAX) 200 MG capsule TAKE ONE CAPSULE BY MOUTH TWICE DAILY   . aspirin EC 81 MG tablet Take 1 tablet (81 mg total) by mouth daily.   Marland Kitchen atorvastatin (LIPITOR) 10 MG tablet TAKE ONE TABLET BY MOUTH DAILY   . Calcium Carbonate-Vit D-Min (CALCIUM 600+D PLUS MINERALS) 600-400 MG-UNIT CHEW Chew 1 tablet by mouth 3 (three) times daily.    . cyanocobalamin (,VITAMIN B-12,) 1000 MCG/ML injection Inject 1,000 mcg as directed every 30 (thirty) days.   Marland Kitchen dexamethasone (DECADRON) 2 MG tablet Take five tablets ('10mg'$ ) by mouth weekly. (Patient taking  differently: Take five tablets ('10mg'$ ) by mouth bi-weekly.) 12/26/2018: [facility] instructed patient to take medication weekly  . dexamethasone (DECADRON) 4 MG tablet TAKE TWO & ONE-HALF (2 & 1/2) TABLETS (50 MG) BY MOUTH EVERY WEEK.   . diazepam (VALIUM) 5 MG tablet TAKE ONE TABLET BY MOUTH EVERY 6 HOURS AS NEEDED FOR ANXIETY   . diltiazem (CARDIZEM CD) 240 MG 24 hr capsule Take 1 capsule (240 mg total) by mouth daily.   Marland Kitchen doxycycline (VIBRA-TABS) 100 MG tablet TAKE ONE TABLET BY MOUTH TWICE DAILY FOR 10 DAYS.   . fluticasone (FLONASE) 50 MCG/ACT nasal spray Place 2 sprays into both nostrils as needed.    Marland Kitchen  furosemide (LASIX) 20 MG tablet Take 1 tablet (20 mg total) by mouth daily as needed (as needed for ankle swelling).   Marland Kitchen guaiFENesin (MUCINEX) 600 MG 12 hr tablet Take 600 mg 2 (two) times daily as needed by mouth for cough or to loosen phlegm.   . magnesium oxide (MAG-OX) 400 (241.3 Mg) MG tablet Take 1 tablet by mouth 2 (two) times daily.   . magnesium oxide (MAG-OX) 400 MG tablet TAKE ONE TABLET BY MOUTH TWICE DAILY. WHEN TAKING LASIX   . metoprolol tartrate (LOPRESSOR) 25 MG tablet TAKE ONE TABLET BY MOUTH TWICE DAILY   . NINLARO 2.3 MG capsule Pt takes three times a month   . ondansetron (ZOFRAN) 8 MG tablet Take 1 tablet (8 mg total) by mouth every 8 (eight) hours as needed for nausea or vomiting.   . pantoprazole (PROTONIX) 40 MG tablet Take 40 mg by mouth as needed.    . pomalidomide (POMALYST) 2 MG capsule Take 1 capsule (2 mg total) by mouth daily. Take with water on days 1-21. Repeat every 28 days.   . potassium chloride (K-DUR) 10 MEQ tablet TAKE ONE TABLET BY MOUTH THREE TIMES DAILY.   Marland Kitchen umeclidinium-vilanterol (ANORO ELLIPTA) 62.5-25 MCG/INH AEPB Inhale 1 puff into the lungs daily.    No facility-administered encounter medications on file as of 05/29/2019.     ALLERGIES:  Allergies  Allergen Reactions  . Lisinopril Cough  . Plavix [Clopidogrel Bisulfate] Swelling     PHYSICAL  EXAM:  ECOG Performance status: 1  Vitals:   05/29/19 1139  BP: (!) 117/57  Pulse: 77  Resp: 20  Temp: (!) 97.1 F (36.2 C)  SpO2: 98%   Filed Weights   05/29/19 1139  Weight: 161 lb 14.4 oz (73.4 kg)    Physical Exam Vitals signs reviewed.  Constitutional:      Appearance: Normal appearance.  Cardiovascular:     Rate and Rhythm: Normal rate and regular rhythm.     Heart sounds: Normal heart sounds.  Pulmonary:     Effort: Pulmonary effort is normal.     Breath sounds: Normal breath sounds.  Abdominal:     General: There is no distension.     Palpations: Abdomen is soft. There is no mass.  Musculoskeletal:        General: No swelling.  Skin:    General: Skin is warm.  Neurological:     General: No focal deficit present.     Mental Status: She is alert and oriented to person, place, and time.  Psychiatric:        Mood and Affect: Mood normal.        Behavior: Behavior normal.      LABORATORY DATA:  I have reviewed the labs as listed.  CBC    Component Value Date/Time   WBC 7.8 05/29/2019 1059   RBC 3.63 (L) 05/29/2019 1059   HGB 12.2 05/29/2019 1059   HCT 38.9 05/29/2019 1059   PLT 157 05/29/2019 1059   MCV 107.2 (H) 05/29/2019 1059   MCH 33.6 05/29/2019 1059   MCHC 31.4 05/29/2019 1059   RDW 17.2 (H) 05/29/2019 1059   LYMPHSABS 1.0 05/29/2019 1059   MONOABS 2.0 (H) 05/29/2019 1059   EOSABS 0.3 05/29/2019 1059   BASOSABS 0.0 05/29/2019 1059   CMP Latest Ref Rng & Units 05/29/2019 05/15/2019 05/01/2019  Glucose 70 - 99 mg/dL 102(H) 102(H) 103(H)  BUN 8 - 23 mg/dL 23 24(H) 22  Creatinine 0.44 - 1.00 mg/dL  1.36(H) 1.35(H) 1.24(H)  Sodium 135 - 145 mmol/L 138 140 142  Potassium 3.5 - 5.1 mmol/L 3.8 3.6 3.7  Chloride 98 - 111 mmol/L 102 104 107  CO2 22 - 32 mmol/L '25 26 25  '$ Calcium 8.9 - 10.3 mg/dL 8.4(L) 8.6(L) 8.1(L)  Total Protein 6.5 - 8.1 g/dL 6.8 6.4(L) 6.5  Total Bilirubin 0.3 - 1.2 mg/dL 0.9 0.5 0.7  Alkaline Phos 38 - 126 U/L 81 69 78  AST  15 - 41 U/L 20 14(L) 20  ALT 0 - 44 U/L '17 12 18       '$ DIAGNOSTIC IMAGING:  I have independently reviewed the scans and discussed with the patient.   I have reviewed Venita Lick LPN's note and agree with the documentation.  I personally performed a face-to-face visit, made revisions and my assessment and plan is as follows.    ASSESSMENT & PLAN:   Multiple myeloma ([place]) 1.  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 -Revlimid and dexamethasone from February 2017 through 05/2016, achieving negative SPEP and immunofixation. - Bone marrow biopsy on 11/14/2016 shows less than 2% plasma cells, started maintenance Velcade on 11/2016 through 07/2017, stopped due to diarrhea. -Noted to have a biochemical relapse with M spike of 0.5, started on reduced dose of Pomalyst 2 mg, Ninlaro and dexamethasone on 08/24/2017.  She is taking dexamethasone 10 mg every week. -Myeloma panel on 05/15/2019 shows M spike of 0.8 g/dL.  Free light chain ratio is 41 with kappa light chains of 445. -I have informed her about the progression of myeloma and the need to change therapy to a daratumumab-based regimen.  She reports that she has an appointment to see Dr. Norma Fredrickson in the first week of December.  She will continue the current treatment until she sees him.  I will see her back in the second week of December.  2.  Left orbit pain: - MRI of the orbits on 03/21/2019 was negative. -She reported some vision problems also.  I have recommended evaluation by ophthalmology.  3.  Bone protection: -She is receiving Zometa 3 mg every 3 months.  4.  Normocytic anemia: -This is from combination of CKD and myeloma. -Last Feraheme was on 03/10/2019.  She is continuing Aranesp every 2 weeks.  Total time spent is 25 minutes with more than 50% of the time  spent face-to-face discussing treatment plan and coordination of care.    Orders placed this encounter:  No orders of the defined types were placed in this encounter.     Derek Jack, MD [place] [phone]

## 2019-05-29 NOTE — Patient Instructions (Signed)
[facility] at [facility] Discharge Instructions  Received Vit B12 injection today. Follow-up as scheduled. Call clinic for any questions or concerns   Thank you for choosing [facility] at [facility] to provide your oncology and hematology care.  To afford each patient quality time with our provider, please arrive at least 15 minutes before your scheduled appointment time.   If you have a lab appointment with the Cancer Center please come in thru the Main Entrance and check in at the main information desk.  You need to re-schedule your appointment should you arrive 10 or more minutes late.  We strive to give you quality time with our providers, and arriving late affects you and other patients whose appointments are after yours.  Also, if you no show three or more times for appointments you may be dismissed from the clinic at the providers discretion.     Again, thank you for choosing [facility].  Our hope is that these requests will decrease the amount of time that you wait before being seen by our physicians.       _____________________________________________________________  Should you have questions after your visit to [facility], please contact our office at [phone] between the hours of 8:00 a.m. and 4:30 p.m.  Voicemails left after 4:00 p.m. will not be returned until the following business day.  For prescription refill requests, have your pharmacy contact our office and allow 72 hours.    Due to Covid, you will need to wear a mask upon entering the hospital. If you do not have a mask, a mask will be given to you at the Main Entrance upon arrival. For doctor visits, patients may have 1 support person with them. For treatment visits, patients can not have anyone with them due to social distancing guidelines and our immunocompromised population.     

## 2019-05-29 NOTE — Assessment & Plan Note (Addendum)
1.  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 -Revlimid and dexamethasone from February 2017 through 05/2016, achieving negative SPEP and immunofixation. - Bone marrow biopsy on 11/14/2016 shows less than 2% plasma cells, started maintenance Velcade on 11/2016 through 07/2017, stopped due to diarrhea. -Noted to have a biochemical relapse with M spike of 0.5, started on reduced dose of Pomalyst 2 mg, Ninlaro and dexamethasone on 08/24/2017.  She is taking dexamethasone 10 mg every week. -Myeloma panel on 05/15/2019 shows M spike of 0.8 g/dL.  Free light chain ratio is 41 with kappa light chains of 445. -I have informed her about the progression of myeloma and the need to change therapy to a daratumumab-based regimen.  She reports that she has an appointment to see Dr. Norma Fredrickson in the first week of December.  She will continue the current treatment until she sees him.  I will see her back in the second week of December.  2.  Left orbit pain: - MRI of the orbits on 03/21/2019 was negative. -She reported some vision problems also.  I have recommended evaluation by ophthalmology.  3.  Bone protection: -She is receiving Zometa 3 mg every 3 months.  4.  Normocytic anemia: -This is from combination of CKD and myeloma. -Last Feraheme was on 03/10/2019.  She is continuing Aranesp every 2 weeks.

## 2019-06-02 ENCOUNTER — Telehealth (HOSPITAL_COMMUNITY): Payer: Self-pay | Admitting: Hematology

## 2019-06-02 ENCOUNTER — Other Ambulatory Visit (HOSPITAL_COMMUNITY): Payer: Self-pay | Admitting: *Deleted

## 2019-06-02 ENCOUNTER — Other Ambulatory Visit (HOSPITAL_COMMUNITY): Payer: Self-pay | Admitting: Hematology

## 2019-06-02 DIAGNOSIS — C9000 Multiple myeloma not having achieved remission: Secondary | ICD-10-CM

## 2019-06-02 MED ORDER — POMALIDOMIDE 2 MG PO CAPS: ORAL_CAPSULE | ORAL | 0 refills | Status: DC

## 2019-06-02 NOTE — Telephone Encounter (Signed)
Faxed Pomalyst rx to [facility] rx

## 2019-06-02 NOTE — Telephone Encounter (Signed)
Fa

## 2019-06-12 ENCOUNTER — Inpatient Hospital Stay (HOSPITAL_COMMUNITY): Payer: Medicare Other

## 2019-06-12 ENCOUNTER — Encounter (HOSPITAL_COMMUNITY): Payer: Self-pay

## 2019-06-12 ENCOUNTER — Other Ambulatory Visit: Payer: Self-pay

## 2019-06-12 ENCOUNTER — Inpatient Hospital Stay (HOSPITAL_COMMUNITY): Payer: Medicare Other | Attending: Hematology

## 2019-06-12 VITALS — BP 107/56 | HR 81 | Temp 97.1°F | Resp 18

## 2019-06-12 DIAGNOSIS — C9000 Multiple myeloma not having achieved remission: Secondary | ICD-10-CM | POA: Insufficient documentation

## 2019-06-12 DIAGNOSIS — D801 Nonfamilial hypogammaglobulinemia: Secondary | ICD-10-CM

## 2019-06-12 DIAGNOSIS — Z7982 Long term (current) use of aspirin: Secondary | ICD-10-CM | POA: Insufficient documentation

## 2019-06-12 DIAGNOSIS — D638 Anemia in other chronic diseases classified elsewhere: Secondary | ICD-10-CM | POA: Diagnosis not present

## 2019-06-12 DIAGNOSIS — D631 Anemia in chronic kidney disease: Secondary | ICD-10-CM

## 2019-06-12 DIAGNOSIS — E538 Deficiency of other specified B group vitamins: Secondary | ICD-10-CM | POA: Diagnosis not present

## 2019-06-12 DIAGNOSIS — Z9221 Personal history of antineoplastic chemotherapy: Secondary | ICD-10-CM | POA: Insufficient documentation

## 2019-06-12 DIAGNOSIS — N183 Chronic kidney disease, stage 3 unspecified: Secondary | ICD-10-CM

## 2019-06-12 DIAGNOSIS — I129 Hypertensive chronic kidney disease with stage 1 through stage 4 chronic kidney disease, or unspecified chronic kidney disease: Secondary | ICD-10-CM | POA: Insufficient documentation

## 2019-06-12 DIAGNOSIS — Z87891 Personal history of nicotine dependence: Secondary | ICD-10-CM | POA: Diagnosis not present

## 2019-06-12 DIAGNOSIS — Z79899 Other long term (current) drug therapy: Secondary | ICD-10-CM | POA: Insufficient documentation

## 2019-06-12 DIAGNOSIS — D509 Iron deficiency anemia, unspecified: Secondary | ICD-10-CM

## 2019-06-12 DIAGNOSIS — R600 Localized edema: Secondary | ICD-10-CM | POA: Insufficient documentation

## 2019-06-12 LAB — COMPREHENSIVE METABOLIC PANEL
ALT: 15 U/L (ref 0–44)
AST: 19 U/L (ref 15–41)
Albumin: 2.9 g/dL — ABNORMAL LOW (ref 3.5–5.0)
Alkaline Phosphatase: 79 U/L (ref 38–126)
Anion gap: 11 (ref 5–15)
BUN: 20 mg/dL (ref 8–23)
CO2: 26 mmol/L (ref 22–32)
Calcium: 8.9 mg/dL (ref 8.9–10.3)
Chloride: 104 mmol/L (ref 98–111)
Creatinine, Ser: 1.56 mg/dL — ABNORMAL HIGH (ref 0.44–1.00)
GFR calc Af Amer: 37 mL/min — ABNORMAL LOW (ref >60)
GFR calc non Af Amer: 31 mL/min — ABNORMAL LOW (ref >60)
Glucose, Bld: 108 mg/dL — ABNORMAL HIGH (ref 70–99)
Potassium: 3.5 mmol/L (ref 3.5–5.1)
Sodium: 141 mmol/L (ref 135–145)
Total Bilirubin: 0.6 mg/dL (ref 0.3–1.2)
Total Protein: 6.5 g/dL (ref 6.5–8.1)

## 2019-06-12 LAB — CBC WITH DIFFERENTIAL/PLATELET
Abs Immature Granulocytes: 0.27 10*3/uL — ABNORMAL HIGH (ref 0.00–0.07)
Basophils Absolute: 0.1 10*3/uL (ref 0.0–0.1)
Basophils Relative: 1 %
Eosinophils Absolute: 0.3 10*3/uL (ref 0.0–0.5)
Eosinophils Relative: 3 %
HCT: 34.3 % — ABNORMAL LOW (ref 36.0–46.0)
Hemoglobin: 10.7 g/dL — ABNORMAL LOW (ref 12.0–15.0)
Immature Granulocytes: 3 %
Lymphocytes Relative: 15 %
Lymphs Abs: 1.4 10*3/uL (ref 0.7–4.0)
MCH: 33.5 pg (ref 26.0–34.0)
MCHC: 31.2 g/dL (ref 30.0–36.0)
MCV: 107.5 fL — ABNORMAL HIGH (ref 80.0–100.0)
Monocytes Absolute: 1.9 10*3/uL — ABNORMAL HIGH (ref 0.1–1.0)
Monocytes Relative: 20 %
Neutro Abs: 5.3 10*3/uL (ref 1.7–7.7)
Neutrophils Relative %: 58 %
Platelets: 238 10*3/uL (ref 150–400)
RBC: 3.19 MIL/uL — ABNORMAL LOW (ref 3.87–5.11)
RDW: 17.3 % — ABNORMAL HIGH (ref 11.5–15.5)
WBC: 9.2 10*3/uL (ref 4.0–10.5)
nRBC: 0 % (ref 0.0–0.2)

## 2019-06-12 MED ORDER — ZOLEDRONIC ACID 4 MG/5ML IV CONC
3.0000 mg | Freq: Once | INTRAVENOUS | Status: DC
  Filled 2019-06-12: qty 3.75

## 2019-06-12 MED ORDER — SODIUM CHLORIDE 0.9 % IV SOLN
INTRAVENOUS | Status: DC
  Administered 2019-06-12: 13:00:00 via INTRAVENOUS

## 2019-06-12 MED ORDER — DARBEPOETIN ALFA 300 MCG/0.6ML IJ SOSY
300.0000 ug | PREFILLED_SYRINGE | Freq: Once | INTRAMUSCULAR | Status: AC
  Administered 2019-06-12: 300 ug via SUBCUTANEOUS
  Filled 2019-06-12: qty 0.6

## 2019-06-12 NOTE — Patient Instructions (Signed)
[facility] at [facility]  Discharge Instructions:   _______________________________________________________________  Thank you for choosing [facility] at [facility] to provide your oncology and hematology care.  To afford each patient quality time with our providers, please arrive at least 15 minutes before your scheduled appointment.  You need to re-schedule your appointment if you arrive 10 or more minutes late.  We strive to give you quality time with our providers, and arriving late affects you and other patients whose appointments are after yours.  Also, if you no show three or more times for appointments you may be dismissed from the clinic.  Again, thank you for choosing [facility] at [facility]. Our hope is that these requests will allow you access to exceptional care and in a timely manner. _______________________________________________________________  If you have questions after your visit, please contact our office at [phone] between the hours of 8:30 a.m. and 5:00 p.m. Voicemails left after 4:30 p.m. will not be returned until the following business day. _______________________________________________________________  For prescription refill requests, have your pharmacy contact our office. _______________________________________________________________  Recommendations made by the consultant and any test results will be sent to your referring physician. _______________________________________________________________ 

## 2019-06-12 NOTE — Progress Notes (Signed)
No zometa today per MD. Will repeat labs in 2 weeks and given if in range. Aranesp and hydration fluids given today per orders. Patient tolerated it well without problems. Vitals stable and discharged home from clinic ambulatory. Follow up as scheduled.

## 2019-06-12 NOTE — Progress Notes (Signed)
Cardiology Clinic Note   Patient Name: Carol Bradley Date of Encounter: 06/13/2019  Primary Care Provider:  [name], MD Primary Cardiologist:  Peter Martinique, MD  Patient Profile    Carol Bradley 78 year old female presents today for follow-up of her coronary artery disease, hypertension, chronic diastolic heart failure, ESL the hyperlipidemia, and dyspnea on exertion.  Past Medical History    Past Medical History:  Diagnosis Date  . 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 CAD (coronary artery disease)   . COPD (chronic obstructive pulmonary disease) ([place])   . History of tobacco abuse   . Hyperlipidemia   . Hypertension   . 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 Multiple myeloma ([place])   . Vitamin B12 deficiency 04/15/2016   Overview:  Vitamin B12 level documented 155, January 2016 with the normal range being 211-924   Past Surgical History:  Procedure Laterality Date  . BACK SURGERY    . CARDIAC CATHETERIZATION  04/2011   right and left cath showing normal right heart pressures,but newly diagnosed coronary artery disease/drug eluting stent placed to RCA with residual disease in the proximal RCA and LAD and ramus, normal LV function and 60-65% EF  . COLONOSCOPY N/A 09/30/2014   Procedure: COLONOSCOPY;  Surgeon: Rogene Houston, MD;  Location: [facility];  Service: Endoscopy;  Laterality: N/A;  225  . CORONARY ANGIOPLASTY WITH STENT PLACEMENT  04/2011   mid RCA: 3.0 X38 mm Promus DES. Residual 40% disease proximally  . NECK SURGERY      Allergies We had like 5 the other day Allergies  Allergen Reactions  . Lisinopril Cough  . Plavix [Clopidogrel Bisulfate] Swelling    History of Present Illness    Carol Bradley has a past medical history of COPD, pulmonary hypertension, CAD with remote stenting to the RCA in 2012, HTN, HLD and dyspnea.  She developed a cough while on ACE inhibitor's and developed hyponatremia on HCTZ.  She is being treated  for myeloma with maintenance chemotherapy and receives periodic iron infusions for anemia.  Her echocardiogram from 01/09/2019 showed an LVEF of 60 to 65%, impaired relaxation, and mildly dilated left atrium, mild to moderate mitral regurgitation, and trivial aortic valve regurgitation.  She was seen by Jory Sims, DNP on 12/2018.  At that time she was noted to have fluid retention.  She was given Lasix to take as needed.  This did seem to be beneficial.  She was last seen by Dr. Martinique on 04/07/2019.  During that time she stated she was doing okay.  She was noted to be quite out of breath with activity.  She had gained weight due to steroid therapy.  She stated she had gained around 12 pounds since January.  However, she did note that when taking her Lasix her breathing did improve some.  Her blood pressure was were in the 448'J systolic at the time.  She presents the clinic today and states she is still receiving weekly maintenance doses of chemotherapy.  She states the cancer treatment makes her very tired.  Oncology has paused cancer treatment this week due to a increase in her creatinine.  Her breathing is unchanged from her previous visit with Dr. Martinique 03/2019.  She states she has noticed fluctuations in her weight.  I will have her start weighing herself daily and give her the salty 6 sheet.  She also states she has noticed more palpitations through the day however she does not  notice them in the evening or at night.  I will increase her metoprolol to 37 . 5 in the AM and 25 PM.   She denies chest pain, increased shortness of breath, lower extremity edema, melena, hematuria, hemoptysis, diaphoresis, weakness, presyncope, syncope, orthopnea, and PND.   Home Medications    Prior to Admission medications   Medication Sig Start Date End Date Taking? Authorizing Provider  acyclovir (ZOVIRAX) 200 MG capsule TAKE ONE CAPSULE BY MOUTH TWICE DAILY 12/23/18   Derek Jack, MD  aspirin EC 81  MG tablet Take 1 tablet (81 mg total) by mouth daily. 01/09/17   Martinique, Peter M, MD  atorvastatin (LIPITOR) 10 MG tablet TAKE ONE TABLET BY MOUTH DAILY 12/09/18   Martinique, Peter M, MD  Calcium Carbonate-Vit D-Min (CALCIUM 600+D PLUS MINERALS) 600-400 MG-UNIT CHEW Chew 1 tablet by mouth 3 (three) times daily.     [provider]  cyanocobalamin (,VITAMIN B-12,) 1000 MCG/ML injection Inject 1,000 mcg as directed every 30 (thirty) days.    [provider]  dexamethasone (DECADRON) 2 MG tablet Take five tablets (54m) by mouth weekly. Patient taking differently: Take five tablets (143m by mouth bi-weekly. 11/13/18   KaDerek JackMD  dexamethasone (DECADRON) 4 MG tablet TAKE TWO & ONE-HALF (2 & 1/2) TABLETS (50 MG) BY MOUTH EVERY WEEK. 05/01/19   KaDerek JackMD  diazepam (VALIUM) 5 MG tablet TAKE ONE TABLET BY MOUTH EVERY 6 HOURS AS NEEDED FOR ANXIETY 10/04/18   KaDerek JackMD  diltiazem (CARDIZEM CD) 240 MG 24 hr capsule Take 1 capsule (240 mg total) by mouth daily. 01/17/19   JoMartiniquePeter M, MD  doxycycline (VIBRA-TABS) 100 MG tablet TAKE ONE TABLET BY MOUTH TWICE DAILY FOR 10 DAYS. 02/18/19   [provider]  fluticasone (FLONASE) 50 MCG/ACT nasal spray Place 2 sprays into both nostrils as needed.     [provider]  furosemide (LASIX) 20 MG tablet Take 1 tablet (20 mg total) by mouth daily as needed (as needed for ankle swelling). 04/07/19   JoMartiniquePeter M, MD  guaiFENesin (MUCINEX) 600 MG 12 hr tablet Take 600 mg 2 (two) times daily as needed by mouth for cough or to loosen phlegm.    [provider]  magnesium oxide (MAG-OX) 400 (241.3 Mg) MG tablet Take 1 tablet by mouth 2 (two) times daily. 04/10/19   [provider]  magnesium oxide (MAG-OX) 400 MG tablet TAKE ONE TABLET BY MOUTH TWICE DAILY. WHEN TAKING LASIX 01/28/19   KaDerek JackMD  metoprolol tartrate (LOPRESSOR) 25 MG tablet TAKE ONE TABLET BY MOUTH TWICE  DAILY 02/24/19   JoMartiniquePeter M, MD  NINLARO 2.3 MG capsule Take one capsule by mouth once weekly for 3 weeks, then off 1 week 06/02/19   Lockamy, Randi L, NP-C  ondansetron (ZOFRAN) 8 MG tablet Take 1 tablet (8 mg total) by mouth every 8 (eight) hours as needed for nausea or vomiting. 08/28/17   DaHolley BoucheNP  pantoprazole (PROTONIX) 40 MG tablet Take 40 mg by mouth as needed.  09/19/18   [provider]  pomalidomide (POMALYST) 2 MG capsule Take 1 capsule (2 mg total) by mouth daily. Take with water on days 1-21. Repeat every 28 days. 06/02/19   KaDerek JackMD  potassium chloride (K-DUR) 10 MEQ tablet TAKE ONE TABLET BY MOUTH THREE TIMES DAILY. 03/20/19   Lockamy, Randi L, NP-C  umeclidinium-vilanterol (ANORO ELLIPTA) 62.5-25 MCG/INH AEPB Inhale 1 puff into the lungs daily.  03/25/19   Derek Jack, MD    Family History    Family History  Problem Relation Age of Onset  . Heart failure Mother   . Cancer Father    She indicated that her mother is deceased. She indicated that her father is deceased. She indicated that her maternal grandmother is deceased. She indicated that her maternal grandfather is deceased. She indicated that her paternal grandmother is deceased. She indicated that her paternal grandfather is deceased.  Social History    Social History   Socioeconomic History  . Marital status: Widowed    Spouse name: Not on file  . Number of children: Not on file  . Years of education: Not on file  . Highest education level: Not on file  Occupational History  . 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 Financial resource strain: Not on file  . Food insecurity    Worry: Not on file    Inability: Not on file  . Transportation needs    Medical: Not on file    Non-medical: Not on file  Tobacco Use  . Smoking status: Former Smoker    Packs/day: 3.00    Years: 25.00    Pack years: 75.00    Types: Cigarettes    Quit  date: 07/10/1994    Years since quitting: 24.9  . Smokeless tobacco: Never Used  Substance and Sexual Activity  . Alcohol use: No  . Drug use: Never  . Sexual activity: Not on file  Lifestyle  . Physical activity    Days per week: Not on file    Minutes per session: Not on file  . Stress: Not on file  Relationships  . Social Herbalist on phone: Not on file    Gets together: Not on file    Attends religious service: Not on file    Active member of club or organization: Not on file    Attends meetings of clubs or organizations: Not on file    Relationship status: Not on file  . Intimate partner violence    Fear of current or ex partner: Not on file    Emotionally abused: Not on file    Physically abused: Not on file    Forced sexual activity: Not on file  Other Topics Concern  . Not on file  Social History Narrative  . Not on file     Review of Systems    General:  No chills, fever, night sweats or weight changes.  Cardiovascular:  No chest pain, dyspnea on exertion, edema, orthopnea, palpitations, paroxysmal nocturnal dyspnea. Dermatological: No rash, lesions/masses Respiratory: No cough, dyspnea Urologic: No hematuria, dysuria Abdominal:   No nausea, vomiting, diarrhea, bright red blood per rectum, melena, or hematemesis Neurologic:  No visual changes, wkns, changes in mental status. All other systems reviewed and are otherwise negative except as noted above.  Physical Exam    VS:  BP (!) 155/71 (BP Location: Left Arm, Patient Position: Sitting, Cuff Size: Normal)   Pulse 70   Ht _0  (1.702 m)   Wt 174 lb 12.8 oz (79.3 kg)   SpO2 99%   BMI 27.38 kg/m  , BMI Body mass index is 27.38 kg/m. GEN: Well nourished, well developed, in no acute distress. HEENT: normal. Neck: Supple, no JVD, carotid bruits, or masses. Cardiac: RRR, no murmurs, rubs, or gallops. No clubbing, cyanosis, .  Generalized bilateral lower extremity nonpitting edema radials/DP/PT 2+ and  equal bilaterally.  Respiratory:  Respirations regular and unlabored, clear to auscultation bilaterally. GI: Soft, nontender, nondistended, BS + x 4. MS: no deformity or atrophy. Skin: warm and dry, no rash. Neuro:  Strength and sensation are intact. Psych: Normal affect.  Accessory Clinical Findings    ECG personally reviewed by me today-sinus rhythm with premature atrial complexes septal infarct 71 bpm- No acute changes  EKG 09/05/2018 Normal sinus rhythm 64 bpm  Echocardiogram 01/09/2019 FINDINGS  Left Ventricle: The left ventricle has normal systolic function, with an ejection fraction of 60-65%. The cavity size was normal. There is moderately increased left ventricular wall thickness. Left ventricular diastolic Doppler parameters are consistent  with impaired relaxation.  Right Ventricle: The right ventricle has normal systolic function. The cavity was normal.  Left Atrium: Left atrial size was mildly dilated.  Right Atrium: Right atrial size was normal in size. Right atrial pressure is estimated at 10 mmHg.  Interatrial Septum: No atrial level shunt detected by color flow Doppler.  Pericardium: There is no evidence of pericardial effusion.  Mitral Valve: The mitral valve is abnormal. Mild calcification of the mitral valve leaflet. There is mild mitral annular calcification present. Mitral valve regurgitation is mild to moderate by color flow Doppler.  Tricuspid Valve: The tricuspid valve is normal in structure. Tricuspid valve regurgitation is trivial by color flow Doppler.  Aortic Valve: The aortic valve is tricuspid Aortic valve regurgitation is trivial by color flow Doppler. There is No stenosis of the aortic valve.  Pulmonic Valve: The pulmonic valve was not well visualized. Pulmonic valve regurgitation is not visualized by color flow Doppler.  Venous: The inferior vena cava is normal in size with greater than 50% respiratory variability.  Additional  Comments: Normal LV systolic function; mild diastolic dysfunction; moderate LVH; sclerotic aortic valve with trace AI; mild to moderate MR; mild LAE.  Myocardial perfusion study 01/01/2018  Nuclear stress EF: 72%. No wall motion abnormalities  No evidence of significant perfusion defects. No TID (transient ischemic dilatation). No infarct or ischemia identified. During Lexiscan infusion, diffuse mild, 1 mm ST segment depression noted.  This is a low risk study. There is not appear to be any ischemia identified. No ischemia in the previously placed RCA stent territory.  Assessment & Plan   1.  Coronary artery disease- no chest pain today.remote stenting to the RCA in 2012, Continue aspirin 81 mg tablet daily Continue metoprolol tartrate 37-1/2 in the morning and 20 5 at night Heart healthy low-sodium diet Increase physical activity as tolerated  HLD-LDL 73 12/24/2017 Continue atorvastatin 10 mg tablet daily Heart healthy low-sodium high-fiber diet Increase physical activity as tolerated  Dyspnea on exertion-room air 99% today.  She is somewhat labored with her breathing at rest.  This is unchanged from prior breathing pattern. Continue inhalers Continue furosemide 20 mg-okay to take an extra dose with 3 pound overnight weight gain or 5 pound in a week Daily weights-log given  Essential hypertension-BP today 155/71.  Better controlled at home. Metoprolol tartrate 37. 5 in the AM 25 mg in the PM Heart healthy low-sodium diet-salty 6 given Continue diltiazem 240 mg tablet daily  Chronic kidney disease stage 3- Creatinine 1.56 06/12/2019.  Her chemotherapy maintenance medication has been paused due to an increase in her creatinine. Followed by oncology  Multiple myeloma- Continues maintenance chemo Followed by oncology  Disposition: Follow-up with Dr. Martinique in 3 months.  Jossie Ng. [facility]  [place] Office  [phone] Fax [phone]

## 2019-06-13 ENCOUNTER — Encounter: Payer: Self-pay | Admitting: General Practice

## 2019-06-13 ENCOUNTER — Ambulatory Visit (INDEPENDENT_AMBULATORY_CARE_PROVIDER_SITE_OTHER): Payer: Medicare Other | Admitting: General Practice

## 2019-06-13 VITALS — BP 155/71 | HR 70 | Ht 67.0 in | Wt 174.8 lb

## 2019-06-13 DIAGNOSIS — I25119 Atherosclerotic heart disease of native coronary artery with unspecified angina pectoris: Secondary | ICD-10-CM

## 2019-06-13 DIAGNOSIS — I10 Essential (primary) hypertension: Secondary | ICD-10-CM

## 2019-06-13 DIAGNOSIS — R0609 Other forms of dyspnea: Secondary | ICD-10-CM

## 2019-06-13 DIAGNOSIS — E785 Hyperlipidemia, unspecified: Secondary | ICD-10-CM

## 2019-06-13 DIAGNOSIS — C9000 Multiple myeloma not having achieved remission: Secondary | ICD-10-CM

## 2019-06-13 DIAGNOSIS — R0600 Dyspnea, unspecified: Secondary | ICD-10-CM

## 2019-06-13 DIAGNOSIS — N183 Chronic kidney disease, stage 3 unspecified: Secondary | ICD-10-CM

## 2019-06-13 MED ORDER — METOPROLOL TARTRATE 25 MG PO TABS: 37.5000 mg | ORAL_TABLET | Freq: Two times a day (BID) | ORAL | 3 refills | Status: DC

## 2019-06-13 NOTE — Patient Instructions (Signed)
Medication Instructions:  INCREASE METOPROLOL TART 37.5MG  (1-1/2 TAB) IN THE AM  AND 25MG  (1 TAB) IN THE PM  If you need a refill on your cardiac medications before your next appointment, please call your pharmacy.  Special Instructions: CONTINUE DAILY WEIGHTS-YOU MAY TAKE ADDITIONAL LASIX AND POTASSIUM, IF YOUR WEIGHT IS >3 POUNDS DAILY OR >5 POUNDS IN ONE WEEK. PLEASE CALL OUR OFFICE TO LET us KNOW WHEN YOU HAVE INCREASED THESE MEDICATIONS [id number].  PLEASE READ AND FOLLOW SALTY 6  PLEASE PURCHASE AND WEAR COMPRESSION STOCKINGS DAILY AND OFF AT BEDTIME. Compression stockings are elastic socks that squeeze the legs. They help to increase blood flow to the legs and to decrease swelling in the legs from fluid retention, and reduce the chance of developing blood clots in the lower legs.   SEE LOW SALT DIET ATTACHED  Follow-Up: IN 3 months  In Person You may see Peter Martinique, MD Avanell Shackleton or one of the following Advanced Practice Providers on your designated Care Team:  Almyra Deforest, PA-C  Fabian Sharp, PA-C or [place], [place].    At [facility], you and your health needs are our priority.  As part of our continuing mission to provide you with exceptional heart care, we have created designated Provider Care Teams.  These Care Teams include your primary Cardiologist (physician) and Advanced Practice Providers (APPs -  Physician Assistants and Nurse Practitioners) who all work together to provide you with the care you need, when you need it.  Thank you for choosing [facility] at [facility]!!             Happy Holidays!!    Low-Sodium Eating Plan Sodium, which is an element that makes up salt, helps you maintain a healthy balance of fluids in your body. Too much sodium can increase your blood pressure and cause fluid and waste to be held in your body. Your health care provider or dietitian may recommend following this plan if you have high blood pressure (hypertension),  kidney disease, liver disease, or heart failure. Eating less sodium can help lower your blood pressure, reduce swelling, and protect your heart, liver, and kidneys. What are tips for following this plan? General guidelines  Most people on this plan should limit their sodium intake to 1,500-2,000 mg (milligrams) of sodium each day. Reading food labels   The Nutrition Facts label lists the amount of sodium in one serving of the food. If you eat more than one serving, you must multiply the listed amount of sodium by the number of servings.  Choose foods with less than 140 mg of sodium per serving.  Avoid foods with 300 mg of sodium or more per serving. Shopping  Look for lower-sodium products, often labeled as "low-sodium" or "no salt added."  Always check the sodium content even if foods are labeled as "unsalted" or "no salt added".  Buy fresh foods. ? Avoid canned foods and premade or frozen meals. ? Avoid canned, cured, or processed meats  Buy breads that have less than 80 mg of sodium per slice. Cooking  Eat more home-cooked food and less restaurant, buffet, and fast food.  Avoid adding salt when cooking. Use salt-free seasonings or herbs instead of table salt or sea salt. Check with your health care provider or pharmacist before using salt substitutes.  Cook with plant-based oils, such as canola, sunflower, or olive oil. Meal planning  When eating at a restaurant, ask that your food be prepared with less salt or no salt,  if possible.  Avoid foods that contain MSG (monosodium glutamate). MSG is sometimes added to [place] food, bouillon, and some canned foods. What foods are recommended? The items listed may not be a complete list. Talk with your dietitian about what dietary choices are best for you. Grains Low-sodium cereals, including oats, puffed wheat and rice, and shredded wheat. Low-sodium crackers. Unsalted rice. Unsalted pasta. Low-sodium bread. Whole-grain breads and  whole-grain pasta. Vegetables Fresh or frozen vegetables. "No salt added" canned vegetables. "No salt added" tomato sauce and paste. Low-sodium or reduced-sodium tomato and vegetable juice. Fruits Fresh, frozen, or canned fruit. Fruit juice. Meats and other protein foods Fresh or frozen (no salt added) meat, poultry, seafood, and fish. Low-sodium canned tuna and salmon. Unsalted nuts. Dried peas, beans, and lentils without added salt. Unsalted canned beans. Eggs. Unsalted nut butters. Dairy Milk. Soy milk. Cheese that is naturally low in sodium, such as ricotta cheese, fresh mozzarella, or Swiss cheese Low-sodium or reduced-sodium cheese. Cream cheese. Yogurt. Fats and oils Unsalted butter. Unsalted margarine with no trans fat. Vegetable oils such as canola or olive oils. Seasonings and other foods Fresh and dried herbs and spices. Salt-free seasonings. Low-sodium mustard and ketchup. Sodium-free salad dressing. Sodium-free light mayonnaise. Fresh or refrigerated horseradish. Lemon juice. Vinegar. Homemade, reduced-sodium, or low-sodium soups. Unsalted popcorn and pretzels. Low-salt or salt-free chips. What foods are not recommended? The items listed may not be a complete list. Talk with your dietitian about what dietary choices are best for you. Grains Instant hot cereals. Bread stuffing, pancake, and biscuit mixes. Croutons. Seasoned rice or pasta mixes. Noodle soup cups. Boxed or frozen macaroni and cheese. Regular salted crackers. Self-rising flour. Vegetables Sauerkraut, pickled vegetables, and relishes. Olives. [place] fries. Onion rings. Regular canned vegetables (not low-sodium or reduced-sodium). Regular canned tomato sauce and paste (not low-sodium or reduced-sodium). Regular tomato and vegetable juice (not low-sodium or reduced-sodium). Frozen vegetables in sauces. Meats and other protein foods Meat or fish that is salted, canned, smoked, spiced, or pickled. Bacon, ham, sausage,  hotdogs, corned beef, chipped beef, packaged lunch meats, salt pork, jerky, pickled herring, anchovies, regular canned tuna, sardines, salted nuts. Dairy Processed cheese and cheese spreads. Cheese curds. Blue cheese. Feta cheese. String cheese. Regular cottage cheese. Buttermilk. Canned milk. Fats and oils Salted butter. Regular margarine. Ghee. Bacon fat. Seasonings and other foods Onion salt, garlic salt, seasoned salt, table salt, and sea salt. Canned and packaged gravies. Worcestershire sauce. Tartar sauce. Barbecue sauce. Teriyaki sauce. Soy sauce, including reduced-sodium. Steak sauce. Fish sauce. Oyster sauce. Cocktail sauce. Horseradish that you find on the shelf. Regular ketchup and mustard. Meat flavorings and tenderizers. Bouillon cubes. Hot sauce and Tabasco sauce. Premade or packaged marinades. Premade or packaged taco seasonings. Relishes. Regular salad dressings. Salsa. Potato and tortilla chips. Corn chips and puffs. Salted popcorn and pretzels. Canned or dried soups. Pizza. Frozen entrees and pot pies. Summary  Eating less sodium can help lower your blood pressure, reduce swelling, and protect your heart, liver, and kidneys.  Most people on this plan should limit their sodium intake to 1,500-2,000 mg (milligrams) of sodium each day.  Canned, boxed, and frozen foods are high in sodium. Restaurant foods, fast foods, and pizza are also very high in sodium. You also get sodium by adding salt to food.  Try to cook at home, eat more fresh fruits and vegetables, and eat less fast food, canned, processed, or prepared foods. This information is not intended to replace advice given to you by your  health care provider. Make sure you discuss any questions you have with your health care provider. ° °

## 2019-06-16 ENCOUNTER — Other Ambulatory Visit (HOSPITAL_COMMUNITY): Payer: Self-pay | Admitting: Hematology

## 2019-06-16 DIAGNOSIS — C9001 Multiple myeloma in remission: Secondary | ICD-10-CM

## 2019-06-26 ENCOUNTER — Other Ambulatory Visit: Payer: Self-pay

## 2019-06-26 ENCOUNTER — Other Ambulatory Visit (HOSPITAL_COMMUNITY): Payer: Medicare Other

## 2019-06-26 ENCOUNTER — Inpatient Hospital Stay (HOSPITAL_BASED_OUTPATIENT_CLINIC_OR_DEPARTMENT_OTHER): Payer: Medicare Other | Admitting: Hematology

## 2019-06-26 ENCOUNTER — Inpatient Hospital Stay (HOSPITAL_COMMUNITY): Payer: Medicare Other

## 2019-06-26 ENCOUNTER — Ambulatory Visit (HOSPITAL_COMMUNITY): Payer: Medicare Other

## 2019-06-26 ENCOUNTER — Encounter (HOSPITAL_COMMUNITY): Payer: Self-pay | Admitting: Hematology

## 2019-06-26 ENCOUNTER — Ambulatory Visit (HOSPITAL_COMMUNITY): Payer: Medicare Other | Admitting: Hematology

## 2019-06-26 DIAGNOSIS — C9000 Multiple myeloma not having achieved remission: Secondary | ICD-10-CM | POA: Diagnosis not present

## 2019-06-26 DIAGNOSIS — D631 Anemia in chronic kidney disease: Secondary | ICD-10-CM

## 2019-06-26 DIAGNOSIS — E538 Deficiency of other specified B group vitamins: Secondary | ICD-10-CM

## 2019-06-26 DIAGNOSIS — D801 Nonfamilial hypogammaglobulinemia: Secondary | ICD-10-CM

## 2019-06-26 DIAGNOSIS — D509 Iron deficiency anemia, unspecified: Secondary | ICD-10-CM

## 2019-06-26 DIAGNOSIS — N183 Chronic kidney disease, stage 3 unspecified: Secondary | ICD-10-CM

## 2019-06-26 LAB — COMPREHENSIVE METABOLIC PANEL
ALT: 28 U/L (ref 0–44)
AST: 26 U/L (ref 15–41)
Albumin: 2.6 g/dL — ABNORMAL LOW (ref 3.5–5.0)
Alkaline Phosphatase: 76 U/L (ref 38–126)
Anion gap: 9 (ref 5–15)
BUN: 26 mg/dL — ABNORMAL HIGH (ref 8–23)
CO2: 25 mmol/L (ref 22–32)
Calcium: 8 mg/dL — ABNORMAL LOW (ref 8.9–10.3)
Chloride: 106 mmol/L (ref 98–111)
Creatinine, Ser: 1.4 mg/dL — ABNORMAL HIGH (ref 0.44–1.00)
GFR calc Af Amer: 42 mL/min — ABNORMAL LOW (ref >60)
GFR calc non Af Amer: 36 mL/min — ABNORMAL LOW (ref >60)
Glucose, Bld: 99 mg/dL (ref 70–99)
Potassium: 3.7 mmol/L (ref 3.5–5.1)
Sodium: 140 mmol/L (ref 135–145)
Total Bilirubin: 0.6 mg/dL (ref 0.3–1.2)
Total Protein: 6 g/dL — ABNORMAL LOW (ref 6.5–8.1)

## 2019-06-26 LAB — CBC WITH DIFFERENTIAL/PLATELET
Abs Immature Granulocytes: 0.05 10*3/uL (ref 0.00–0.07)
Basophils Absolute: 0 10*3/uL (ref 0.0–0.1)
Basophils Relative: 0 %
Eosinophils Absolute: 0.3 10*3/uL (ref 0.0–0.5)
Eosinophils Relative: 4 %
HCT: 31 % — ABNORMAL LOW (ref 36.0–46.0)
Hemoglobin: 9.5 g/dL — ABNORMAL LOW (ref 12.0–15.0)
Immature Granulocytes: 1 %
Lymphocytes Relative: 13 %
Lymphs Abs: 1 10*3/uL (ref 0.7–4.0)
MCH: 33.2 pg (ref 26.0–34.0)
MCHC: 30.6 g/dL (ref 30.0–36.0)
MCV: 108.4 fL — ABNORMAL HIGH (ref 80.0–100.0)
Monocytes Absolute: 1.5 10*3/uL — ABNORMAL HIGH (ref 0.1–1.0)
Monocytes Relative: 20 %
Neutro Abs: 4.7 10*3/uL (ref 1.7–7.7)
Neutrophils Relative %: 62 %
Platelets: 181 10*3/uL (ref 150–400)
RBC: 2.86 MIL/uL — ABNORMAL LOW (ref 3.87–5.11)
RDW: 17.2 % — ABNORMAL HIGH (ref 11.5–15.5)
WBC: 7.6 10*3/uL (ref 4.0–10.5)
nRBC: 0 % (ref 0.0–0.2)

## 2019-06-26 MED ORDER — DARBEPOETIN ALFA 300 MCG/0.6ML IJ SOSY
PREFILLED_SYRINGE | INTRAMUSCULAR | Status: AC
  Filled 2019-06-26: qty 0.6

## 2019-06-26 MED ORDER — CYANOCOBALAMIN 1000 MCG/ML IJ SOLN
1000.0000 ug | Freq: Once | INTRAMUSCULAR | Status: AC
  Administered 2019-06-26: 11:00:00 1000 ug via INTRAMUSCULAR
  Filled 2019-06-26: qty 1

## 2019-06-26 MED ORDER — DARBEPOETIN ALFA 500 MCG/ML IJ SOSY
PREFILLED_SYRINGE | INTRAMUSCULAR | Status: AC
  Filled 2019-06-26: qty 1

## 2019-06-26 MED ORDER — DARBEPOETIN ALFA 300 MCG/0.6ML IJ SOSY
300.0000 ug | PREFILLED_SYRINGE | Freq: Once | INTRAMUSCULAR | Status: AC
  Administered 2019-06-26: 12:00:00 300 ug via SUBCUTANEOUS

## 2019-06-26 NOTE — Patient Instructions (Addendum)
[facility] at [facility] Discharge Instructions  You were seen today by Dr. Katragadda. He went over your recent lab results. He will see you back in 3 weeks for labs, treatment and follow up.   Thank you for choosing  Cancer Center at [facility] to provide your oncology and hematology care.  To afford each patient quality time with our provider, please arrive at least 15 minutes before your scheduled appointment time.   If you have a lab appointment with the Cancer Center please come in thru the  Main Entrance and check in at the main information desk  You need to re-schedule your appointment should you arrive 10 or more minutes late.  We strive to give you quality time with our providers, and arriving late affects you and other patients whose appointments are after yours.  Also, if you no show three or more times for appointments you may be dismissed from the clinic at the providers discretion.     Again, thank you for choosing [facility].  Our hope is that these requests will decrease the amount of time that you wait before being seen by our physicians.       _____________________________________________________________  Should you have questions after your visit to [facility], please contact our office at [phone] between the hours of 8:00 a.m. and 4:30 p.m.  Voicemails left after 4:00 p.m. will not be returned until the following business day.  For prescription refill requests, have your pharmacy contact our office and allow 72 hours.    Cancer Center Support Programs:   > Cancer Support Group  2nd Tuesday of the month 1pm-2pm, Journey Room    

## 2019-06-26 NOTE — Progress Notes (Signed)
B12 and aranesp injection given today only per MD. Will hold zometa till after xmas per MD.   Patient tolerated it well without problems. Vitals stable and discharged home from clinic ambulatory. Follow up as scheduled.

## 2019-06-26 NOTE — Progress Notes (Signed)
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    03/18/2015 - 07/28/2015 Chemotherapy   Velcade 1.6 mg/m2 discontinued secondary to intolerance   07/28/2015 Adverse Reaction   stool incontinence, weakness, collapse upon standing, felt to be secondary to velcade   11/09/2015 - 12/13/2015 Chemotherapy   cytoxan IV 300 mg/m2 administered X 2 doses only in addition to rev/dex   11/09/2015 - 06/08/2016 Chemotherapy   Revlimid/Dexamethasone    02/05/2017 - 07/09/2017 Chemotherapy   The patient had bortezomib SQ (VELCADE) chemo injection 1.75 mg, 1 mg/m2 = 1.75 mg (66.7 % of original dose 1.5 mg/m2), Subcutaneous,  Once, 1 of 8 cycles Dose modification: 1.5 mg/m2 (original dose 1.5 mg/m2, Cycle 1, Reason: Provider Judgment, Comment: Per Dr. Norma Fredrickson recommendations from [facility] 1 mg/m2 (original dose 1.5 mg/m2, Cycle 1, Reason: Provider Judgment)  for chemotherapy treatment.     07/09/2017 Adverse Reaction   Diarrhea    Chemotherapy   Pomalyst 2 mg (Days 1-21 every 28 days), Ixazomib 2.3 mg (days 1, 8, 15 every 28 days), and Dexamethasone 10 mg (weekly)- Rx's printed on 08/24/2017.  Treatment recommendations from Dr. Norma Fredrickson at [facility].      CANCER STAGING: Cancer Staging No matching staging information was found for the patient.   INTERVAL HISTORY:  Ms. Saefong 78 y.o. female seen for follow-up of multiple myeloma.  She was evaluated by Dr. Norma Fredrickson at [facility] on 06/23/2019.  Reports appetite 75%.  Energy levels are 25%.  Mild  fatigue is also reported.  Denies any bleeding per rectum or melena.  Shortness of breath on exertion is present.  Left-sided lower back pain and hip pain is stated as 7 out of 10.  Numbness in the feet is also stable.  She is currently taking Ninlaro and pomalidomide.  REVIEW OF SYSTEMS:  Review of Systems  Respiratory: Positive for shortness of breath.   Cardiovascular: Positive for leg swelling.  Neurological: Positive for numbness.  All other systems reviewed and are negative.    PAST MEDICAL/SURGICAL HISTORY:  Past Medical History:  Diagnosis Date  . 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 CAD (coronary artery disease)   . COPD (chronic obstructive pulmonary disease) ([place])   . History of tobacco abuse   . Hyperlipidemia   . Hypertension   . 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 Multiple myeloma ([place])   . Vitamin B12 deficiency 04/15/2016   Overview:  Vitamin B12 level documented 155, January 2016 with the normal range being 211-924   Past Surgical History:  Procedure Laterality Date  . BACK SURGERY    . CARDIAC CATHETERIZATION  04/2011   right and left cath showing normal right heart pressures,but newly diagnosed coronary artery disease/drug eluting stent placed to RCA with residual disease in the proximal RCA and LAD and ramus, normal LV function and 60-65% EF  . COLONOSCOPY N/A 09/30/2014   Procedure: COLONOSCOPY;  Surgeon: Rogene Houston, MD;  [facility];  Service: Endoscopy;  Laterality: N/A;  225  . CORONARY ANGIOPLASTY WITH  STENT PLACEMENT  04/2011   mid RCA: 3.0 X38 mm Promus DES. Residual 40% disease proximally  . NECK SURGERY       SOCIAL HISTORY:  Social History   Socioeconomic History  . Marital status: Widowed    Spouse name: Not on file  . Number of children: Not on file  . Years of education: Not on file  . Highest education level: Not on file  Occupational History  . 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 Smoking status: Former Smoker    Packs/day: 3.00    Years: 25.00    Pack years: 75.00    Types: Cigarettes    Quit date: 07/10/1994    Years since quitting: 25.0  . Smokeless tobacco: Never Used  Substance and Sexual Activity  . Alcohol use: No  . Drug use: Never  . Sexual activity: Not on file  Other Topics Concern  . Not on file  Social History Narrative  . Not on file   Social Determinants of Health   Financial Resource Strain:   . Difficulty of Paying Living Expenses: Not on file  Food Insecurity:   . Worried About Charity fundraiser in the Last Year: Not on file  . Ran Out of Food in the Last Year: Not on file  Transportation Needs:   . Lack of Transportation (Medical): Not on file  . Lack of Transportation (Non-Medical): Not on file  Physical Activity:   . Days of Exercise per Week: Not on file  . Minutes of Exercise per Session: Not on file  Stress:   . Feeling of Stress : Not on file  Social Connections:   . Frequency of Communication with Friends and Family: Not on file  . Frequency of Social Gatherings with Friends and Family: Not on file  . Attends Religious Services: Not on file  . Active Member of Clubs or Organizations: Not on file  . Attends Archivist Meetings: Not on file  . Marital Status: Not on file  Intimate Partner Violence:   . Fear of Current or Ex-Partner: Not on file  . Emotionally Abused: Not on file  . Physically Abused: Not on file  . Sexually Abused: Not on file    FAMILY HISTORY:  Family History  Problem Relation Age of Onset  . Heart failure Mother   . Cancer Father     CURRENT MEDICATIONS:  Outpatient Encounter Medications as of 06/26/2019  Medication Sig Note  . acyclovir (ZOVIRAX) 200 MG capsule TAKE ONE CAPSULE BY MOUTH TWICE DAILY   . aspirin EC 81 MG tablet Take 1 tablet (81 mg total) by mouth daily.   Marland Kitchen atorvastatin (LIPITOR) 10 MG tablet TAKE ONE TABLET BY MOUTH DAILY   .  Calcium Carbonate-Vit D-Min (CALCIUM 600+D PLUS MINERALS) 600-400 MG-UNIT CHEW Chew 1 tablet by mouth 3 (three) times daily.    . cyanocobalamin (,VITAMIN B-12,) 1000 MCG/ML injection Inject 1,000 mcg as directed every 30 (thirty) days.   Marland Kitchen dexamethasone (DECADRON) 2 MG tablet Take five tablets (8m) by mouth weekly. (Patient taking differently: Take five tablets (172m by mouth bi-weekly.) 12/26/2018: [facility] instructed patient to take medication weekly  . dexamethasone (DECADRON) 4 MG tablet TAKE TWO & ONE-HALF (2 & 1/2) TABLETS (50 MG) BY MOUTH EVERY WEEK.   . diazepam (VALIUM) 5 MG tablet TAKE ONE TABLET BY MOUTH EVERY 6 HOURS AS NEEDED FOR ANXIETY   . diltiazem (CARDIZEM CD) 240 MG  24 hr capsule Take 1 capsule (240 mg total) by mouth daily.   . fluticasone (FLONASE) 50 MCG/ACT nasal spray Place 2 sprays into both nostrils as needed.    . furosemide (LASIX) 20 MG tablet Take 1 tablet (20 mg total) by mouth daily as needed (as needed for ankle swelling).   Marland Kitchen guaiFENesin (MUCINEX) 600 MG 12 hr tablet Take 600 mg 2 (two) times daily as needed by mouth for cough or to loosen phlegm.   . magnesium oxide (MAG-OX) 400 (241.3 Mg) MG tablet Take 1 tablet by mouth 2 (two) times daily.   . magnesium oxide (MAG-OX) 400 MG tablet TAKE ONE TABLET BY MOUTH TWICE DAILY. WHEN TAKING LASIX   . metoprolol tartrate (LOPRESSOR) 25 MG tablet Take 1.5 tablets (37.5 mg total) by mouth 2 (two) times daily.   Marland Kitchen NINLARO 2.3 MG capsule Take one capsule by mouth once weekly for 3 weeks, then off 1 week   . ondansetron (ZOFRAN) 8 MG tablet Take 1 tablet (8 mg total) by mouth every 8 (eight) hours as needed for nausea or vomiting.   . pantoprazole (PROTONIX) 40 MG tablet Take 40 mg by mouth as needed.    . polyethylene glycol powder (GLYCOLAX/MIRALAX) 17 GM/SCOOP powder Take by mouth as needed.    . potassium chloride (K-DUR) 10 MEQ tablet TAKE ONE TABLET BY MOUTH THREE TIMES DAILY.   Marland Kitchen umeclidinium-vilanterol (ANORO ELLIPTA)  62.5-25 MCG/INH AEPB Inhale 1 puff into the lungs daily.   . Zoledronic Acid 4 MG SOLR Inject into the vein.   . [DISCONTINUED] diltiazem (TIAZAC) 240 MG 24 hr capsule 240 mg every morning.   . [DISCONTINUED] doxycycline (VIBRA-TABS) 100 MG tablet TAKE ONE TABLET BY MOUTH TWICE DAILY FOR 10 DAYS.   . [DISCONTINUED] pomalidomide (POMALYST) 2 MG capsule Take 1 capsule (2 mg total) by mouth daily. Take with water on days 1-21. Repeat every 28 days.    No facility-administered encounter medications on file as of 06/26/2019.    ALLERGIES:  Allergies  Allergen Reactions  . Lisinopril Cough  . Plavix [Clopidogrel Bisulfate] Swelling     PHYSICAL EXAM:  ECOG Performance status: 1  Vitals:   06/26/19 1010  BP: (!) 115/48  Pulse: 65  Resp: (!) 22  Temp: (!) 97.1 F (36.2 C)  SpO2: 98%   Filed Weights   06/26/19 1010  Weight: 164 lb 1.6 oz (74.4 kg)    Physical Exam Vitals reviewed.  Constitutional:      Appearance: Normal appearance.  Cardiovascular:     Rate and Rhythm: Normal rate and regular rhythm.     Heart sounds: Normal heart sounds.  Pulmonary:     Effort: Pulmonary effort is normal.     Breath sounds: Normal breath sounds.  Abdominal:     General: There is no distension.     Palpations: Abdomen is soft. There is no mass.  Musculoskeletal:        General: No swelling.  Skin:    General: Skin is warm.  Neurological:     General: No focal deficit present.     Mental Status: She is alert and oriented to person, place, and time.  Psychiatric:        Mood and Affect: Mood normal.        Behavior: Behavior normal.      LABORATORY DATA:  I have reviewed the labs as listed.  CBC    Component Value Date/Time   WBC 7.6 06/26/2019 0915   RBC  2.86 (L) 06/26/2019 0915   HGB 9.5 (L) 06/26/2019 0915   HCT 31.0 (L) 06/26/2019 0915   PLT 181 06/26/2019 0915   MCV 108.4 (H) 06/26/2019 0915   MCH 33.2 06/26/2019 0915   MCHC 30.6 06/26/2019 0915   RDW 17.2 (H)  06/26/2019 0915   LYMPHSABS 1.0 06/26/2019 0915   MONOABS 1.5 (H) 06/26/2019 0915   EOSABS 0.3 06/26/2019 0915   BASOSABS 0.0 06/26/2019 0915   CMP Latest Ref Rng & Units 06/26/2019 06/12/2019 05/29/2019  Glucose 70 - 99 mg/dL 99 108(H) 102(H)  BUN 8 - 23 mg/dL 26(H) 20 23  Creatinine 0.44 - 1.00 mg/dL 1.40(H) 1.56(H) 1.36(H)  Sodium 135 - 145 mmol/L 140 141 138  Potassium 3.5 - 5.1 mmol/L 3.7 3.5 3.8  Chloride 98 - 111 mmol/L 106 104 102  CO2 22 - 32 mmol/L _0 Calcium 8.9 - 10.3 mg/dL 8.0(L) 8.9 8.4(L)  Total Protein 6.5 - 8.1 g/dL 6.0(L) 6.5 6.8  Total Bilirubin 0.3 - 1.2 mg/dL 0.6 0.6 0.9  Alkaline Phos 38 - 126 U/L 76 79 81  AST 15 - 41 U/L _1 ALT 0 - 44 U/L _2 DIAGNOSTIC IMAGING:  I have independently reviewed the scans and discussed with the patient.   I have reviewed Venita Lick LPN's note and agree with the documentation.  I personally performed a face-to-face visit, made revisions and my assessment and plan is as follows.    ASSESSMENT & PLAN:   Multiple myeloma ([place]) 1.  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 -Revlimid and dexamethasone from February 2017 through 05/2016, achieving negative SPEP and immunofixation. - Bone marrow biopsy on 11/14/2016 shows less than 2% plasma cells, started maintenance Velcade on 11/2016 through 07/2017, stopped due to diarrhea. -Noted to have a biochemical relapse with M spike of 0.5, started on reduced dose of Pomalyst 2 mg, Ninlaro and dexamethasone on 08/24/2017.  She is taking dexamethasone 10 mg every week. -I reviewed labs from 06/23/2019 at [facility].  M spike increased to 1.12 g.  Kappa light chains increased to 629 and lambda light chains 8.8.  Ratio has increased to 70.92. -She will be switched to daratumumab,  pomalidomide and dexamethasone.  We will consider subcutaneous daratumumab. -We have discussed the regimen in detail.  We will plan to start in 3 weeks.  2.  Left orbit pain: - MRI of the orbits on 03/21/2019 was negative. -She reported some vision problems also.  I have recommended evaluation by ophthalmology.  3.  Bone protection: -She is receiving Zometa 3 mg every 3 months.  4.  Normocytic anemia: -This is from combination of CKD and myeloma. -Last Feraheme infusion was on 03/10/2019.  Current hemoglobin is 9.5.  She will continue Aranesp if hemoglobin drops below 10.  Total time spent is 25 minutes with more than 50% of the time spent face-to-face discussing lab results, change in treatment plan and coordination of care.    Orders placed this encounter:  No orders of the defined types were placed in this encounter.     Derek Jack, MD [facility] [phone]

## 2019-06-27 ENCOUNTER — Ambulatory Visit: Payer: Medicare Other | Admitting: Cardiology

## 2019-06-27 ENCOUNTER — Ambulatory Visit (HOSPITAL_COMMUNITY): Payer: Medicare Other

## 2019-07-01 ENCOUNTER — Other Ambulatory Visit (HOSPITAL_COMMUNITY): Payer: Self-pay | Admitting: *Deleted

## 2019-07-01 ENCOUNTER — Encounter (HOSPITAL_COMMUNITY): Payer: Self-pay | Admitting: *Deleted

## 2019-07-01 DIAGNOSIS — C9000 Multiple myeloma not having achieved remission: Secondary | ICD-10-CM

## 2019-07-01 MED ORDER — POMALIDOMIDE 2 MG PO CAPS: ORAL_CAPSULE | ORAL | 0 refills | Status: DC

## 2019-07-08 NOTE — Progress Notes (Signed)
+    Cardiology Office Note   Date:  07/09/2019   ID:  [name], [name] [birth year], MRN [id number]  PCP:  [name], MD  Cardiologist:  Peter Martinique, MD EP: None  Chief Complaint  Patient presents with  . Edema    feet and leg       History of Present Illness: Carol Bradley is a 78 y.o. female with PMH of CAD s/p PCI/DES to RCA in 2012, HTN, HLD, mitral regurgitation, pulmHTN, COPD, CKD stage 3, and multiple myeloma on maintenance chemotherapy, who presents with complaints of LE edema and SOB.   She was last evaluated by cardiology at an outpatient visit with Coletta Memos, NP 06/13/2019, at which time she noticed increased palpitations and some fluctuation in her weights. Her metoprolol was increased to 37.10m qAM and 25mqPM for management of palpitations and she was recommended to limit salt intake and monitor weights daily. She was recommended to follow-up in 3 months with Dr. JoMartiniqueHer last echo 01/2019 showed EF 60-65%, G1DD, moderate LVH, mild-moderate MR, and mild LAE. Her last ischemic evaluation was a NST 12/2017 which showed no infarct or ischemia.   She presents today for follow-up of her SOB and LE edema. She was seen by her PCP last week with complaints of worsening symptoms. She was started on lasix 4022mID x 2 days, then 90m31mily and reports improvement in symptoms, though breathing is not quite back to baseline. She has been wearing compression stockings which has helped with LE edema. We discussed the nature of diastolic heart failure and importance of limiting salt intake and monitoring daily weights going forward. She notes increased dietary indiscretion over the holidays. She reports improvement in her palpitations with increase in her metoprolol. No complaints of chest pain, orthopnea, PND, dizziness, lightheadedness, or syncope. She is anticipating starting a new multiple myeloma regimen in the coming weeks, though has some reservations as there are more pulmonary  side effects with the treatment plan, for which she will need inhalers.     Past Medical History:  Diagnosis Date  . 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 CAD (coronary artery disease)   . COPD (chronic obstructive pulmonary disease) ([facility])[place]. History of tobacco abuse   . Hyperlipidemia   . Hypertension   . Hypogammaglobulinemia (HCC)[place]8/2017  . Multiple myeloma (HCC)[place]. Vitamin B12 deficiency 04/15/2016   Overview:  Vitamin B12 level documented 155, January 2016 with the normal range being 211-924    Past Surgical History:  Procedure Laterality Date  . BACK SURGERY    . CARDIAC CATHETERIZATION  04/2011   right and left cath showing normal right heart pressures,but newly diagnosed coronary artery disease/drug eluting stent placed to RCA with residual disease in the proximal RCA and LAD and ramus, normal LV function and 60-65% EF  . COLONOSCOPY N/A 09/30/2014   Procedure: COLONOSCOPY;  Surgeon: NajeRogene Houston;  [facility];  Service: Endoscopy;  Laterality: N/A;  225  . CORONARY ANGIOPLASTY WITH STENT PLACEMENT  04/2011   mid RCA: 3.0 X38 mm Promus DES. Residual 40% disease proximally  . NECK SURGERY       Current Outpatient Medications  Medication Sig Dispense Refill  . acyclovir (ZOVIRAX) 200 MG capsule TAKE ONE CAPSULE BY MOUTH TWICE DAILY 180 capsule 1  . aspirin EC 81 MG tablet Take 1 tablet (81 mg total) by mouth daily. 90 tablet  3  . atorvastatin (LIPITOR) 10 MG tablet TAKE ONE TABLET BY MOUTH DAILY 90 tablet 3  . Calcium Carbonate-Vit D-Min (CALCIUM 600+D PLUS MINERALS) 600-400 MG-UNIT CHEW Chew 1 tablet by mouth 3 (three) times daily.     . cyanocobalamin (,VITAMIN B-12,) 1000 MCG/ML injection Inject 1,000 mcg as directed every 30 (thirty) days.    Marland Kitchen dexamethasone (DECADRON) 2 MG tablet Take five tablets (19m) by mouth weekly. (Patient taking differently: Take five tablets (156m by mouth bi-weekly.) 20 tablet 11  .  dexamethasone (DECADRON) 4 MG tablet TAKE TWO & ONE-HALF (2 & 1/2) TABLETS (50 MG) BY MOUTH EVERY WEEK. 40 tablet 3  . diazepam (VALIUM) 5 MG tablet TAKE ONE TABLET BY MOUTH EVERY 6 HOURS AS NEEDED FOR ANXIETY 30 tablet 1  . diltiazem (CARDIZEM CD) 240 MG 24 hr capsule Take 1 capsule (240 mg total) by mouth daily. 90 capsule 3  . fluticasone (FLONASE) 50 MCG/ACT nasal spray Place 2 sprays into both nostrils as needed.     . furosemide (LASIX) 20 MG tablet Take 1 tablet (20 mg total) by mouth daily as needed (as needed for ankle swelling). 90 tablet 3  . guaiFENesin (MUCINEX) 600 MG 12 hr tablet Take 600 mg 2 (two) times daily as needed by mouth for cough or to loosen phlegm.    . magnesium oxide (MAG-OX) 400 (241.3 Mg) MG tablet Take 1 tablet by mouth 2 (two) times daily.    . magnesium oxide (MAG-OX) 400 MG tablet TAKE ONE TABLET BY MOUTH TWICE DAILY. WHEN TAKING LASIX 60 tablet 2  . metoprolol tartrate (LOPRESSOR) 25 MG tablet Take 1.5 tablets (37.5 mg total) by mouth 2 (two) times daily. 75 tablet 3  . NINLARO 2.3 MG capsule Take one capsule by mouth once weekly for 3 weeks, then off 1 week 3 capsule 10  . ondansetron (ZOFRAN) 8 MG tablet Take 1 tablet (8 mg total) by mouth every 8 (eight) hours as needed for nausea or vomiting. 30 tablet 2  . pantoprazole (PROTONIX) 40 MG tablet Take 40 mg by mouth as needed.     . polyethylene glycol powder (GLYCOLAX/MIRALAX) 17 GM/SCOOP powder Take by mouth as needed.     . pomalidomide (POMALYST) 2 MG capsule Take 1 capsule (2 mg total) by mouth daily. Take with water on days 1-21. Repeat every 28 days. 21 capsule 0  . potassium chloride (K-DUR) 10 MEQ tablet TAKE ONE TABLET BY MOUTH THREE TIMES DAILY. 90 tablet 4  . umeclidinium-vilanterol (ANORO ELLIPTA) 62.5-25 MCG/INH AEPB Inhale 1 puff into the lungs daily. 60 each 1  . Zoledronic Acid 4 MG SOLR Inject into the vein.     No current facility-administered medications for this visit.    Allergies:    Lisinopril and Plavix [clopidogrel bisulfate]    Social History:  The patient  reports that she quit smoking about 25 years ago. Her smoking use included cigarettes. She has a 75.00 pack-year smoking history. She has never used smokeless tobacco. She reports that she does not drink alcohol or use drugs.   Family History:  The patient's family history includes Cancer in her father; Heart failure in her mother.    ROS:  Please see the history of present illness.   Otherwise, review of systems are positive for none.   All other systems are reviewed and negative.    PHYSICAL EXAM: VS:  BP 135/72   Pulse 74   Temp (!) 97.3 F (36.3 C)   Ht  _0  (1.702 m)   Wt 172 lb 3.2 oz (78.1 kg)   SpO2 97%   BMI 26.97 kg/m  , BMI Body mass index is 26.97 kg/m. GEN: Well nourished, well developed, in no acute distress HEENT: sclera anicteric Neck: no JVD, carotid bruits, or masses Cardiac: RRR; no murmurs, rubs, or gallops, trace LE edema  Respiratory:  clear to auscultation bilaterally, normal work of breathing GI: soft, nontender, nondistended, + BS MS: no deformity or atrophy Skin: warm and dry, no rash Neuro:  Strength and sensation are intact Psych: euthymic mood, full affect   EKG:  EKG is not ordered today.   Recent Labs: 12/11/2018: Magnesium 2.0 06/26/2019: ALT 28; BUN 26; Creatinine, Ser 1.40; Hemoglobin 9.5; Platelets 181; Potassium 3.7; Sodium 140    Lipid Panel    Component Value Date/Time   CHOL 133 12/24/2017 1322   TRIG 74 12/24/2017 1322   HDL 45 12/24/2017 1322   CHOLHDL 3.0 12/24/2017 1322   VLDL 15 12/24/2017 1322   LDLCALC 73 12/24/2017 1322      Wt Readings from Last 3 Encounters:  07/09/19 172 lb 3.2 oz (78.1 kg)  06/26/19 164 lb 1.6 oz (74.4 kg)  06/13/19 174 lb 12.8 oz (79.3 kg)      Other studies Reviewed: Additional studies/ records that were reviewed today include:   Echocardiogram 01/2019: 1. The left ventricle has normal systolic function with  an ejection fraction of 60-65%. The cavity size was normal. There is moderately increased left ventricular wall thickness. Left ventricular diastolic Doppler parameters are consistent with impaired  relaxation.  2. Left atrial size was mildly dilated.  3. The mitral valve is abnormal. Mild calcification of the mitral valve leaflet. There is mild mitral annular calcification present. Mitral valve regurgitation is mild to moderate by color flow Doppler.  4. The aortic valve is tricuspid. Aortic valve regurgitation is trivial by color flow Doppler. No stenosis of the aortic valve.  5. Normal LV systolic function; mild diastolic dysfunction; moderate LVH; sclerotic aortic valve with trace AI; mild to moderate MR; mild LAE.  NST 12/2017:  Nuclear stress EF: 72%. No wall motion abnormalities  No evidence of significant perfusion defects. No TID (transient ischemic dilatation). No infarct or ischemia identified. During Lexiscan infusion, diffuse mild, 1 mm ST segment depression noted.  This is a low risk study. There is not appear to be any ischemia identified. No ischemia in the previously placed RCA stent territory.  ASSESSMENT AND PLAN:  1. Chronic diastolic CHF: appears to through the acute phase after increased lasix regimen. Breathing is almost back to baseline and only trace LE edema on exam. She had BMET yesterday and will have results faxed to our office.  Weight is still up 8lbs from 06/26/2019, though with improved symptoms, hopeful this will trend down with ongoing lasix.  - Will continue lasix 80m daily for now with 30 mEq K - Continue metoprolol - Encouraged weights daily with plans to take additional lasix if weight gain of 3lbs in 1 day or 5lbs in 1 week.  - Continue to limit salt intake  2. CAD s/p PCI/DES to RCA in 2012: no complaints of chest pain - Continue aspirin, statin, and metoprolol  3. HTN: BP 135/72 today. Hopeful this will improve with limiting salt intake and ongoing  diuresis.  - Continue metoprolol and diltiazem  4. HLD: LDL 73 12/2017 - Continue low dose atorvastatin  5. CKD stage 3: Cr 1.4 06/26/2019 (baseline 1.2-1.3) - Will  check BMET next week for close monitoring with diuresis.   6. Multiple Myeloma: anticipating new therapy in the coming weeks.  - Continue management per Oncology  7. Anemia: Hgb 9.5 06/26/2019, baseline appears to be ~10.8. No overt bleeding.  - Continue to monitor closely per oncology.    Current medicines are reviewed at length with the patient today.  The patient does not have concerns regarding medicines.  The following changes have been made:  no change  Labs/ tests ordered today include: Patient to have BMET faxed from yesterday.   No orders of the defined types were placed in this encounter.    Disposition:   FU with Dr. Martinique in 6 months  Signed, Abigail Butts, PA-C  07/09/2019 8:08 AM

## 2019-07-09 ENCOUNTER — Ambulatory Visit (INDEPENDENT_AMBULATORY_CARE_PROVIDER_SITE_OTHER): Payer: Medicare Other | Admitting: Medical

## 2019-07-09 ENCOUNTER — Encounter: Payer: Self-pay | Admitting: Medical

## 2019-07-09 ENCOUNTER — Other Ambulatory Visit: Payer: Self-pay

## 2019-07-09 ENCOUNTER — Ambulatory Visit: Payer: Medicare Other | Admitting: Medical

## 2019-07-09 VITALS — BP 135/72 | HR 74 | Temp 97.3°F | Ht 67.0 in | Wt 172.2 lb

## 2019-07-09 DIAGNOSIS — I10 Essential (primary) hypertension: Secondary | ICD-10-CM | POA: Diagnosis not present

## 2019-07-09 DIAGNOSIS — C9000 Multiple myeloma not having achieved remission: Secondary | ICD-10-CM

## 2019-07-09 DIAGNOSIS — N183 Chronic kidney disease, stage 3 unspecified: Secondary | ICD-10-CM

## 2019-07-09 DIAGNOSIS — I2510 Atherosclerotic heart disease of native coronary artery without angina pectoris: Secondary | ICD-10-CM | POA: Diagnosis not present

## 2019-07-09 DIAGNOSIS — E785 Hyperlipidemia, unspecified: Secondary | ICD-10-CM

## 2019-07-09 DIAGNOSIS — I5032 Chronic diastolic (congestive) heart failure: Secondary | ICD-10-CM | POA: Diagnosis not present

## 2019-07-09 DIAGNOSIS — I340 Nonrheumatic mitral (valve) insufficiency: Secondary | ICD-10-CM

## 2019-07-09 NOTE — Patient Instructions (Addendum)
Medication Instructions: no changes However if you have a weight gain of 3 pounds in a day and 5 pounds in a week you can take an extra tablet of Lasix.  *If you need a refill on your cardiac medications before your next appointment, please call your pharmacy*  Lab Work: none at this time. Please fax your recent labs to [facility] for review the fax # is [phone]  If you have labs (blood work) drawn today and your tests are completely normal, you will receive your results only by: Marland Kitchen MyChart Message (if you have MyChart) OR . A paper copy in the mail If you have any lab test that is abnormal or we need to change your treatment, we will call you to review the results.   Follow-Up: At [facility], you and your health needs are our priority.  As part of our continuing mission to provide you with exceptional heart care, we have created designated Provider Care Teams.  These Care Teams include your primary Cardiologist (physician) and Advanced Practice Providers (APPs -  Physician Assistants and Nurse Practitioners) who all work together to provide you with the care you need, when you need it.  Your next appointment:   6 month(s)  The format for your next appointment:   Either In Person or Virtual  Provider:   Peter Martinique, MD  Other Instructions Please follow the [employer] [employer] recommendation handout. Please log you daily weights.          Heart Failure Education: 1. Weigh yourself EVERY morning after you go to the bathroom but before you eat or drink anything. Write this number down in a weight log/diary. If you gain 3 pounds overnight or 5 pounds in a week, call the office. 2. Take your medicines as prescribed. If you have concerns about your medications, please call us before you stop taking them.  3. Eat low salt foods-Limit salt (sodium) to 2000 mg per day. This will help prevent your body from holding onto fluid. Read food labels as many processed foods have a lot of sodium,  especially canned goods and prepackaged meats. If you would like some assistance choosing low sodium foods, we would be happy to set you up with a nutritionist. 4. Stay as active as you can everyday. Staying active will give you more energy and make your muscles stronger. Start with 5 minutes at a time and work your way up to 30 minutes a day. Break up your activities--do some in the morning and some in the afternoon. Start with 3 days per week and work your way up to 5 days as you can.  If you have chest pain, feel short of breath, dizzy, or lightheaded, STOP. If you don't feel better after a short rest, call 911. If you do feel better, call the office to let us know you have symptoms with exercise. 5. Limit all fluids for the day to less than 2 liters. Fluid includes all drinks, coffee, juice, ice chips, soup, jello, and all other liquids.

## 2019-07-13 NOTE — Assessment & Plan Note (Signed)
1.  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 -Revlimid and dexamethasone from February 2017 through 05/2016, achieving negative SPEP and immunofixation. - Bone marrow biopsy on 11/14/2016 shows less than 2% plasma cells, started maintenance Velcade on 11/2016 through 07/2017, stopped due to diarrhea. -Noted to have a biochemical relapse with M spike of 0.5, started on reduced dose of Pomalyst 2 mg, Ninlaro and dexamethasone on 08/24/2017.  She is taking dexamethasone 10 mg every week. -I reviewed labs from 06/23/2019 at [facility].  M spike increased to 1.12 g.  Kappa light chains increased to 629 and lambda light chains 8.8.  Ratio has increased to 70.92. -She will be switched to daratumumab, pomalidomide and dexamethasone.  We will consider subcutaneous daratumumab. -We have discussed the regimen in detail.  We will plan to start in 3 weeks.  2.  Left orbit pain: - MRI of the orbits on 03/21/2019 was negative. -She reported some vision problems also.  I have recommended evaluation by ophthalmology.  3.  Bone protection: -She is receiving Zometa 3 mg every 3 months.  4.  Normocytic anemia: -This is from combination of CKD and myeloma. -Last Feraheme infusion was on 03/10/2019.  Current hemoglobin is 9.5.  She will continue Aranesp if hemoglobin drops below 10.

## 2019-07-14 ENCOUNTER — Other Ambulatory Visit (HOSPITAL_COMMUNITY): Payer: Self-pay | Admitting: *Deleted

## 2019-07-14 DIAGNOSIS — C9000 Multiple myeloma not having achieved remission: Secondary | ICD-10-CM

## 2019-07-15 ENCOUNTER — Other Ambulatory Visit (HOSPITAL_COMMUNITY): Payer: Self-pay | Admitting: *Deleted

## 2019-07-15 ENCOUNTER — Encounter (HOSPITAL_COMMUNITY): Payer: Self-pay | Admitting: *Deleted

## 2019-07-15 DIAGNOSIS — C9000 Multiple myeloma not having achieved remission: Secondary | ICD-10-CM

## 2019-07-15 NOTE — Progress Notes (Unsigned)
Per Dr. Delton Coombes and his consultation with [facility] Dr. Tiana Loft.  Carol Bradley is to continue taking her pomalyst, start on Daratumumab.  I have called and explained this to Carol Bradley.  I briefly explained what to expect on days of treatment.  I advised her that she will get a detailed teaching before she begins treatment. She was given her new appointments.  She verbalizes understanding.

## 2019-07-21 ENCOUNTER — Other Ambulatory Visit (HOSPITAL_COMMUNITY): Payer: Medicare Other

## 2019-07-21 ENCOUNTER — Ambulatory Visit (HOSPITAL_COMMUNITY): Payer: Medicare Other

## 2019-07-22 ENCOUNTER — Ambulatory Visit (HOSPITAL_COMMUNITY): Payer: Medicare Other

## 2019-07-23 ENCOUNTER — Other Ambulatory Visit (HOSPITAL_COMMUNITY): Payer: Self-pay | Admitting: Hematology

## 2019-07-23 ENCOUNTER — Other Ambulatory Visit (HOSPITAL_COMMUNITY): Payer: Self-pay | Admitting: *Deleted

## 2019-07-23 MED ORDER — ALBUTEROL SULFATE HFA 108 (90 BASE) MCG/ACT IN AERS: 2.0000 | INHALATION_SPRAY | Freq: Four times a day (QID) | RESPIRATORY_TRACT | 2 refills | Status: DC | PRN

## 2019-07-23 MED ORDER — MONTELUKAST SODIUM 10 MG PO TABS: ORAL_TABLET | ORAL | 0 refills | Status: DC

## 2019-07-23 NOTE — Progress Notes (Signed)
START ON PATHWAY REGIMEN - Multiple Myeloma and Other Plasma Cell Dyscrasias     Cycles 1 and 2: A cycle is every 28 days:     Pomalidomide      Dexamethasone      Daratumumab and hyaluronidase-fihj    Cycles 3 through 6: A cycle is every 28 days:     Pomalidomide      Dexamethasone      Dexamethasone      Daratumumab and hyaluronidase-fihj    Cycles 7 and beyond: A cycle is every 28 days:     Pomalidomide      Dexamethasone      Dexamethasone      Daratumumab and hyaluronidase-fihj   **Always confirm dose/schedule in your pharmacy ordering system**  Patient Characteristics: Relapsed / Refractory, Second through Fourth Lines of Therapy R-ISS Staging: II Disease Classification: Refractory Line of Therapy: Fourth Line Intent of Therapy: Non-Curative / Palliative Intent, Discussed with Patient

## 2019-07-23 NOTE — Progress Notes (Signed)
Pt aware that singulair and albuterol prescriptions have been sent to her pharmacy. Pt verbalized understanding on how to properly take both medications.

## 2019-07-23 NOTE — Progress Notes (Signed)
Patient on plan of care prior to pathways. 

## 2019-07-25 NOTE — Patient Instructions (Signed)
Daratumumab (Darzalex Faspro)  About This Drug Daratumumab is used to treat cancer. It is given under the skin in your abdomen (subcutaneously).  Possible Side Effects  . You may have a reaction to the drug. Sometimes you may be given medication to stop or lessen these side effects. Your nurse will check you closely for these signs: fever or shaking chills, flushing, facial swelling, feeling dizzy,  headache, trouble breathing, rash, itching, chest tightness, or chest pain. These reactions may happen after your infusion. If this happens, call 911 for emergency care.  . Decrease in the number of white blood cells and platelets. This may raise your risk of infection, and raise your risk of bleeding.  . Fever and chills  . Tiredness  . Feeling dizzy  . Trouble sleeping  . Cough and trouble breathing  . Upper respiratory infection  . Nausea and throwing up (vomiting)  . Loose bowel movements (diarrhea)  . Constipation (not able to move bowels)  . Muscle spasms  . Pain in the joints  . Back pain  . Swelling of your legs, ankles and/or feet  . Effects on the nerves are called peripheral neuropathy. You may feel numbness, tingling, or pain in your hands and feet. It may be hard for you to button your clothes, open jars, or walk as usual. The effect on the nerves may get worse with more doses of the drug. These effects get better in some people after the drug is stopped but it does not get better in all people.  Note: Each of the side effects above was reported in 20% or greater of patients treated with daratumumab. Not all possible side effects are included above.  Warnings and Precautions  . Severe decrease in the number of white blood cells and platelets    Severe allergic reaction  . This medication can affect the results of blood tests that match your blood type. Your blood type will be tested before treatment. Be sure to tell all healthcare providers you are taking  this medicine before receiving blood transfusions, even for 6 months after your last dose.  Important Information  . This drug may be present in the saliva, tears, sweat, urine, stool, vomit, semen, and vaginal secretions. Talk to your doctor and/or your nurse about the necessary precautions to take during this time.  Treating Side Effects  . Drink plenty of fluids (a minimum of eight glasses per day is recommended).  . If you throw up or have loose bowel movements, you should drink more fluids so that you do not become dehydrated (lack of water in the body from losing too much fluid).  . To help with nausea and vomiting, eat small, frequent meals instead of three large meals a day. Choose foods and drinks that are at room temperature. Ask your nurse or doctor about other helpful tips and medicine that is available to help stop or lessen these symptoms.  . If you have diarrhea, eat low-fiber foods that are high in protein and calories and avoid foods that can irritate your digestive tracts or lead to cramping.  . Ask your nurse or doctor about medicine that can lessen or stop your diarrhea or constipation.  . If you are not able to move your bowels, check with your doctor or nurse before you use enemas, laxatives, or suppositories.  . Manage tiredness by pacing your activities for the day.  . Be sure to include periods of rest between energy-draining activities.  . To decrease  the risk of infection, wash your hands regularly.  . Avoid close contact with people who have a cold, the flu, or other infections.  . Take your temperature as your doctor or nurse tells you, and whenever you feel like you may have a fever.  . To help decrease the risk of bleeding, use a soft toothbrush. Check with your nurse before using dental floss.  . Be very careful when using knives or tools.  . Use an electric shaver instead of a razor.  Marland Kitchen Keeping your pain under control is important to your  well-being. Please tell your doctor or nurse if you are experiencing pain.  . If you are dizzy, get up slowly after sitting or lying.  . If you are having trouble sleeping, talk to your nurse or doctor on tips to help you sleep better.  . If you have numbness and tingling in your hands and feet, be careful when cooking, walking, and handling sharp objects and hot liquids.  . Infusion reactions may rarely occur after your infusion. If this happens, call 911 for emergency care.  Food and Drug Interactions  . There are no known interactions of daratumumab with food.  . This drug may interact with other medicines. Tell your doctor and pharmacist about all the prescription and over-the-counter medicines and dietary supplements (vitamins, minerals, herbs and others) that you are taking at this time. Also, check with your doctor or pharmacist before starting any new prescription or over-the-counter medicines, or dietary supplements to make sure that there are no interactions.  When to Call the Doctor  Call your doctor or nurse if you have any of these symptoms and/or any new or unusual symptoms:  . Fever of 100.4 F (38 C) or higher  . Chills  . Pain in your chest  . Coughing up yellow, green, or bloody mucus.  . Wheezing or trouble breathing  . Tiredness that interferes with your daily activities  . Trouble falling or staying asleep  . Feeling dizzy or lightheaded  . Easy bleeding or bruising  . Nausea that stops you from eating or drinking and/or is not relieved by prescribed medicines  . Vomiting  . No bowel movement in 3 days or when you feel uncomfortable.  . Loose bowel movements (diarrhea) 4 times a day or loose bowel movements with lack of strength or a feeling of being dizzy  . Weight gain of 5 pounds in one week (fluid retention)  . Swelling of your legs, ankles and/or feet  . Pain that does not go away, or is not relieved by prescribed medicines  . Signs of  infusion reaction: fever or shaking chills, flushing, facial swelling, feeling dizzy, headache, trouble breathing, rash, itching, chest tightness, or chest pain. If this happens, call 911 for emergency care.  . Numbness, tingling, or pain in your hands and feet  . If you think you may be pregnant or may have impregnated your partner  Reproduction Warnings  . Pregnancy warning: This drug may have harmful effects on the unborn baby. Women of childbearing potential should use effective methods of birth control during your cancer treatment and for at least 3 months after treatment. Let your doctor know right away if you think you may be pregnant.  . Breastfeeding warning: It is not known if this drug passes into breast milk. For this reason, women should talk to their doctor about the risks and benefits of breastfeeding during treatment with this drug because this drug may enter  the breast milk and cause harm to a breastfeeding baby.  . Fertility warning: Human fertility studies have not been done with this drug. Talk with your doctor or nurse if you plan to have children. Ask for information on sperm or egg banking.    SELF CARE ACTIVITIES WHILE ON IMMUNOTHERAPY:  Hydration Increase your fluid intake 48 hours prior to treatment and drink at least 8 to 12 cups (64 ounces) of water/decaffeinated beverages per day after treatment. You can still have your cup of coffee or soda but these beverages do not count as part of your 8 to 12 cups that you need to drink daily. No alcohol intake.  Medications Continue taking your normal prescription medication as prescribed.  If you start any new herbal or new supplements please let us know first to make sure it is safe.  Mouth Care Have teeth cleaned professionally before starting treatment. Keep dentures and partial plates clean. Use soft toothbrush and do not use mouthwashes that contain alcohol. Biotene is a good mouthwash that is available at most  pharmacies or may be ordered by calling [phone]. Use warm salt water gargles (1 teaspoon salt per 1 quart warm water) before and after meals and at bedtime. Or you may rinse with 2 tablespoons of three-percent hydrogen peroxide mixed in eight ounces of water. If you are still having problems with your mouth or sores in your mouth please call the clinic. If you need dental work, please let the doctor know before you go for your appointment so that we can coordinate the best possible time for you in regards to your chemo regimen. You need to also let your dentist know that you are actively taking chemo. We may need to do labs prior to your dental appointment.  Skin Care Always use sunscreen that has not expired and with SPF (Sun Protection Factor) of 50 or higher. Wear hats to protect your head from the sun. Remember to use sunscreen on your hands, ears, face, & feet.  Use good moisturizing lotions such as udder cream, eucerin, or even Vaseline. Some chemotherapies can cause dry skin, color changes in your skin and nails.    . Avoid long, hot showers or baths. . Use gentle, fragrance-free soaps and laundry detergent. . Use moisturizers, preferably creams or ointments rather than lotions because the thicker consistency is better at preventing skin dehydration. Apply the cream or ointment within 15 minutes of showering. Reapply moisturizer at night, and moisturize your hands every time after you wash them.   Infection Prevention Please wash your hands for at least 30 seconds using warm soapy water. Handwashing is the #1 way to prevent the spread of germs. Stay away from sick people or people who are getting over a cold. If you develop respiratory systems such as green/yellow mucus production or productive cough or persistent cough let us know and we will see if you need an antibiotic. It is a good idea to keep a pair of gloves on when going into grocery stores/[employer] to decrease your risk of coming  into contact with germs on the carts, etc. Carry alcohol hand gel with you at all times and use it frequently if out in public. If your temperature reaches 100.5 or higher please call the clinic and let us know.  If it is after hours or on the weekend please go to the ER if your temperature is over 100.4.  Please have your own personal thermometer at home to use.  Sex and bodily fluids If you are going to have sex, a condom must be used to protect the person that isn't taking immunotherapy. For a few days after treatment, immunotherapy can be excreted through your bodily fluids.  When using the toilet please close the lid and flush the toilet twice.  Do this for a few day after you have had immunotherapy.   Contraception It is not known for sure whether or not immunotherapy drugs can be passed on through semen or secretions from the vagina. Because of this some doctors advise people to use a barrier method if you have sex during treatment. This applies to vaginal, anal or oral sex.  Generally, doctors advise a barrier method only for the time you are actually having the treatment and for about a week after your treatment.  Advice like this can be worrying, but this does not mean that you have to avoid being intimate with your partner. You can still have close contact with your partner and continue to enjoy sex.  Animals If you have cats or birds we just ask that you not change the litter or change the cage.  Please have someone else do this for you while you are on immunotherapy.   Food Safety During and After Cancer Treatment Food safety is important for people both during and after cancer treatment. Cancer and cancer treatments, such as chemotherapy, radiation therapy, and stem cell/bone marrow transplantation, often weaken the immune system. This makes it harder for your body to protect itself from foodborne illness, also called food poisoning. Foodborne illness is caused by eating food that  contains harmful bacteria, parasites, or viruses.  Foods to avoid Some foods have a higher risk of becoming tainted with bacteria. These include: Marland Kitchen Unwashed fresh fruit and vegetables, especially leafy vegetables that can hide dirt and other contaminants . Raw sprouts, such as alfalfa sprouts . Raw or undercooked beef, especially ground beef, or other raw or undercooked meat and poultry . Fatty, fried, or spicy foods immediately before or after treatment.  These can sit heavy on your stomach and make you feel nauseous. . Raw or undercooked shellfish, such as oysters. . Sushi and sashimi, which often contain raw fish.  . Unpasteurized beverages, such as unpasteurized fruit juices, raw milk, raw yogurt, or cider . Undercooked eggs, such as soft boiled, over easy, and poached; raw, unpasteurized eggs; or foods made with raw egg, such as homemade raw cookie dough and homemade mayonnaise  Simple steps for food safety  Shop smart. . Do not buy food stored or displayed in an unclean area. . Do not buy bruised or damaged fruits or vegetables. . Do not buy cans that have cracks, dents, or bulges. . Pick up foods that can spoil at the end of your shopping trip and store them in a cooler on the way home.  Prepare and clean up foods carefully. . Rinse all fresh fruits and vegetables under running water, and dry them with a clean towel or paper towel. . Clean the top of cans before opening them. . After preparing food, wash your hands for 20 seconds with hot water and soap. Pay special attention to areas between fingers and under nails. . Clean your utensils and dishes with hot water and soap. Marland Kitchen Disinfect your kitchen and cutting boards using 1 teaspoon of liquid, unscented bleach mixed into 1 quart of water.    Dispose of old food. . Eat canned and packaged food before its expiration date (the "  use by" or "best before" date). . Consume refrigerated leftovers within 3 to 4 days. After that time,  throw out the food. Even if the food does not smell or look spoiled, it still may be unsafe. Some bacteria, such as Listeria, can grow even on foods stored in the refrigerator if they are kept for too long.  Take precautions when eating out. . At restaurants, avoid buffets and salad bars where food sits out for a long time and comes in contact with many people. Food can become contaminated when someone with a virus, often a norovirus, or another "bug" handles it. . Put any leftover food in a "to-go" container yourself, rather than having the server do it. And, refrigerate leftovers as soon as you get home. . Choose restaurants that are clean and that are willing to prepare your food as you order it cooked.    SYMPTOMS TO REPORT AS SOON AS POSSIBLE AFTER TREATMENT:   FEVER GREATER THAN 100.5 F  CHILLS WITH OR WITHOUT FEVER  NAUSEA AND VOMITING THAT IS NOT CONTROLLED WITH YOUR NAUSEA MEDICATION  UNUSUAL SHORTNESS OF BREATH  UNUSUAL BRUISING OR BLEEDING  TENDERNESS IN MOUTH AND THROAT WITH OR WITHOUT PRESENCE OF ULCERS  URINARY PROBLEMS  BOWEL PROBLEMS  UNUSUAL RASH     Wear comfortable clothing and clothing appropriate for easy access to any Portacath or PICC line. Let us know if there is anything that we can do to make your therapy better!   What to do if you need assistance after hours or on the weekends: CALL [phone].  HOLD on the line, do not hang up.  You will hear multiple messages but at the end you will be connected with a nurse triage line.  They will contact the doctor if necessary.  Most of the time they will be able to assist you.  Do not call the hospital operator.    I have been informed and understand all of the instructions given to me and have received a copy. I have been instructed to call the clinic [phone] or my family physician as soon as possible for continued medical care, if indicated. I do not have any more questions at this time but understand  that I may call the [facility] or the Patient Navigator at [phone] during office hours should I have questions or need assistance in obtaining follow-up care.

## 2019-07-28 ENCOUNTER — Inpatient Hospital Stay (HOSPITAL_BASED_OUTPATIENT_CLINIC_OR_DEPARTMENT_OTHER): Payer: Medicare Other | Admitting: Hematology

## 2019-07-28 ENCOUNTER — Encounter (HOSPITAL_COMMUNITY): Payer: Self-pay | Admitting: Hematology

## 2019-07-28 ENCOUNTER — Other Ambulatory Visit: Payer: Self-pay

## 2019-07-28 ENCOUNTER — Inpatient Hospital Stay (HOSPITAL_COMMUNITY): Payer: Medicare Other | Attending: Hematology

## 2019-07-28 ENCOUNTER — Inpatient Hospital Stay (HOSPITAL_COMMUNITY): Payer: Medicare Other

## 2019-07-28 VITALS — BP 112/48 | HR 84

## 2019-07-28 VITALS — BP 116/59 | HR 81 | Temp 97.0°F | Resp 18 | Wt 173.2 lb

## 2019-07-28 DIAGNOSIS — C9000 Multiple myeloma not having achieved remission: Secondary | ICD-10-CM

## 2019-07-28 DIAGNOSIS — Z79899 Other long term (current) drug therapy: Secondary | ICD-10-CM | POA: Insufficient documentation

## 2019-07-28 DIAGNOSIS — I129 Hypertensive chronic kidney disease with stage 1 through stage 4 chronic kidney disease, or unspecified chronic kidney disease: Secondary | ICD-10-CM | POA: Diagnosis present

## 2019-07-28 DIAGNOSIS — D631 Anemia in chronic kidney disease: Secondary | ICD-10-CM | POA: Insufficient documentation

## 2019-07-28 DIAGNOSIS — Z7982 Long term (current) use of aspirin: Secondary | ICD-10-CM | POA: Diagnosis not present

## 2019-07-28 DIAGNOSIS — Z7952 Long term (current) use of systemic steroids: Secondary | ICD-10-CM | POA: Diagnosis not present

## 2019-07-28 DIAGNOSIS — R600 Localized edema: Secondary | ICD-10-CM | POA: Insufficient documentation

## 2019-07-28 DIAGNOSIS — T451X5A Adverse effect of antineoplastic and immunosuppressive drugs, initial encounter: Secondary | ICD-10-CM | POA: Insufficient documentation

## 2019-07-28 DIAGNOSIS — N183 Chronic kidney disease, stage 3 unspecified: Secondary | ICD-10-CM | POA: Diagnosis not present

## 2019-07-28 DIAGNOSIS — D509 Iron deficiency anemia, unspecified: Secondary | ICD-10-CM

## 2019-07-28 DIAGNOSIS — J449 Chronic obstructive pulmonary disease, unspecified: Secondary | ICD-10-CM | POA: Diagnosis not present

## 2019-07-28 DIAGNOSIS — E538 Deficiency of other specified B group vitamins: Secondary | ICD-10-CM | POA: Insufficient documentation

## 2019-07-28 DIAGNOSIS — H5712 Ocular pain, left eye: Secondary | ICD-10-CM | POA: Insufficient documentation

## 2019-07-28 DIAGNOSIS — I2510 Atherosclerotic heart disease of native coronary artery without angina pectoris: Secondary | ICD-10-CM | POA: Insufficient documentation

## 2019-07-28 DIAGNOSIS — E785 Hyperlipidemia, unspecified: Secondary | ICD-10-CM | POA: Diagnosis not present

## 2019-07-28 DIAGNOSIS — D801 Nonfamilial hypogammaglobulinemia: Secondary | ICD-10-CM

## 2019-07-28 DIAGNOSIS — Z5111 Encounter for antineoplastic chemotherapy: Secondary | ICD-10-CM | POA: Diagnosis present

## 2019-07-28 DIAGNOSIS — Z87891 Personal history of nicotine dependence: Secondary | ICD-10-CM | POA: Insufficient documentation

## 2019-07-28 DIAGNOSIS — G620 Drug-induced polyneuropathy: Secondary | ICD-10-CM | POA: Diagnosis not present

## 2019-07-28 LAB — COMPREHENSIVE METABOLIC PANEL WITH GFR
ALT: 31 U/L (ref 0–44)
AST: 35 U/L (ref 15–41)
Albumin: 2.8 g/dL — ABNORMAL LOW (ref 3.5–5.0)
Alkaline Phosphatase: 77 U/L (ref 38–126)
Anion gap: 7 (ref 5–15)
BUN: 28 mg/dL — ABNORMAL HIGH (ref 8–23)
CO2: 24 mmol/L (ref 22–32)
Calcium: 8.5 mg/dL — ABNORMAL LOW (ref 8.9–10.3)
Chloride: 111 mmol/L (ref 98–111)
Creatinine, Ser: 1.27 mg/dL — ABNORMAL HIGH (ref 0.44–1.00)
GFR calc Af Amer: 47 mL/min — ABNORMAL LOW
GFR calc non Af Amer: 40 mL/min — ABNORMAL LOW
Glucose, Bld: 98 mg/dL (ref 70–99)
Potassium: 4 mmol/L (ref 3.5–5.1)
Sodium: 142 mmol/L (ref 135–145)
Total Bilirubin: 0.6 mg/dL (ref 0.3–1.2)
Total Protein: 7 g/dL (ref 6.5–8.1)

## 2019-07-28 LAB — CBC WITH DIFFERENTIAL/PLATELET
Abs Immature Granulocytes: 0.54 10*3/uL — ABNORMAL HIGH (ref 0.00–0.07)
Basophils Absolute: 0.1 10*3/uL (ref 0.0–0.1)
Basophils Relative: 1 %
Eosinophils Absolute: 0.1 10*3/uL (ref 0.0–0.5)
Eosinophils Relative: 1 %
HCT: 30.2 % — ABNORMAL LOW (ref 36.0–46.0)
Hemoglobin: 9 g/dL — ABNORMAL LOW (ref 12.0–15.0)
Immature Granulocytes: 5 %
Lymphocytes Relative: 20 %
Lymphs Abs: 2.2 10*3/uL (ref 0.7–4.0)
MCH: 32.6 pg (ref 26.0–34.0)
MCHC: 29.8 g/dL — ABNORMAL LOW (ref 30.0–36.0)
MCV: 109.4 fL — ABNORMAL HIGH (ref 80.0–100.0)
Monocytes Absolute: 1.4 10*3/uL — ABNORMAL HIGH (ref 0.1–1.0)
Monocytes Relative: 13 %
Neutro Abs: 6.7 10*3/uL (ref 1.7–7.7)
Neutrophils Relative %: 60 %
Platelets: 238 10*3/uL (ref 150–400)
RBC: 2.76 MIL/uL — ABNORMAL LOW (ref 3.87–5.11)
RDW: 16.5 % — ABNORMAL HIGH (ref 11.5–15.5)
WBC: 10.9 10*3/uL — ABNORMAL HIGH (ref 4.0–10.5)
nRBC: 0.9 % — ABNORMAL HIGH (ref 0.0–0.2)

## 2019-07-28 LAB — TYPE AND SCREEN
ABO/RH(D): O POS
Antibody Screen: NEGATIVE

## 2019-07-28 MED ORDER — DIPHENHYDRAMINE HCL 25 MG PO CAPS
50.0000 mg | ORAL_CAPSULE | Freq: Once | ORAL | Status: AC
  Administered 2019-07-28: 50 mg via ORAL
  Filled 2019-07-28: qty 2

## 2019-07-28 MED ORDER — ACETAMINOPHEN 325 MG PO TABS
650.0000 mg | ORAL_TABLET | Freq: Once | ORAL | Status: AC
  Administered 2019-07-28: 650 mg via ORAL
  Filled 2019-07-28: qty 2

## 2019-07-28 MED ORDER — DARATUMUMAB-HYALURONIDASE-FIHJ 1800-30000 MG-UT/15ML ~~LOC~~ SOLN
1800.0000 mg | Freq: Once | SUBCUTANEOUS | Status: AC
  Administered 2019-07-28: 1800 mg via SUBCUTANEOUS
  Filled 2019-07-28: qty 15

## 2019-07-28 MED ORDER — ZOLEDRONIC ACID 4 MG/5ML IV CONC
3.0000 mg | Freq: Once | INTRAVENOUS | Status: AC
  Administered 2019-07-28: 3 mg via INTRAVENOUS
  Filled 2019-07-28: qty 3.75

## 2019-07-28 MED ORDER — CYANOCOBALAMIN 1000 MCG/ML IJ SOLN
1000.0000 ug | Freq: Once | INTRAMUSCULAR | Status: AC
  Administered 2019-07-28: 1000 ug via INTRAMUSCULAR
  Filled 2019-07-28: qty 1

## 2019-07-28 MED ORDER — MONTELUKAST SODIUM 10 MG PO TABS: 10.0000 mg | ORAL_TABLET | Freq: Once | ORAL | Status: DC

## 2019-07-28 MED ORDER — DARBEPOETIN ALFA 300 MCG/0.6ML IJ SOSY
300.0000 ug | PREFILLED_SYRINGE | Freq: Once | INTRAMUSCULAR | Status: AC
  Administered 2019-07-28: 300 ug via SUBCUTANEOUS
  Filled 2019-07-28: qty 0.6

## 2019-07-28 MED ORDER — DEXAMETHASONE 4 MG PO TABS
20.0000 mg | ORAL_TABLET | Freq: Once | ORAL | Status: AC
  Administered 2019-07-28: 20 mg via ORAL
  Filled 2019-07-28: qty 5

## 2019-07-28 NOTE — Progress Notes (Signed)
Patient has been assessed, vital signs and labs have been reviewed by Dr. Katragadda. ANC, Creatinine, LFTs, and Platelets are within treatment parameters per Dr. Katragadda. The patient is good to proceed with treatment at this time.  

## 2019-07-28 NOTE — Progress Notes (Signed)
Lab work reviewed and patient seen by Dr. Delton Coombes who approved patient for C1D1 Daratumumab SQ. Pt to also receive Aranesp, B12, and Zometa per MD.  Pt tolerated treatment well without incident or complaint. VSS. Pt observed for 2 hours post injection per MD orders. Discharged in satisfactory condition with follow up instructions. Pt reminded to call us for any questions or concerns. Understanding verbalized.

## 2019-07-28 NOTE — Assessment & Plan Note (Addendum)
1.  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 -Revlimid and dexamethasone from February 2017 through 05/2016, achieving negative SPEP and immunofixation. - Bone marrow biopsy on 11/14/2016 shows less than 2% plasma cells, started maintenance Velcade on 11/2016 through 07/2017, stopped due to diarrhea. -Ninlaro, Pomalyst and dexamethasone from 08/24/2017 through December 2020 with progression.  -Labs from [facility] on 06/23/2019 shows M spike of 1.12 g.  Kappa light chains 629 with lambda light chains 8.8 and ratio 70.9.  -She will be started on daratumumab, pomalidomide and dexamethasone.  We discussed the regimen in detail.  We discussed the side effects in detail.  She will receive weekly subcu daratumumab for the first 2 cycles.  We started her on Singulair 10 mg on Friday.  We have also given albuterol inhaler. -I have reviewed her labs.  She will proceed with her first treatment today.  She started Pomalyst today.  She will take 20 mg dexamethasone today.  This will be cut back to 10 mg weekly.  I will see her back in 1 week for follow-up.  2.  Left orbit pain: - MRI of the orbits on 03/21/2019 was negative. -She reported some vision problems also.  I have recommended evaluation by ophthalmology.  3.  Bone strengthening: -She will continue Zometa 3 mg every 3 months.  4.  Normocytic anemia: -Hemoglobin today is 9.  She will take Aranesp injection today.  5.  CKD: -Her creatinine today is 1.27.  Her baseline is between 1.2 and 1.5.

## 2019-07-28 NOTE — Progress Notes (Signed)
[address] [place], [place] [address]   CLINIC:  Medical Oncology/Hematology  PCP:  [name], MD [place] [address] [phone]   REASON FOR VISIT:  Follow-up for multiple myeloma   BRIEF ONCOLOGIC HISTORY:  Oncology History  Multiple myeloma ([place])  02/26/2015 Initial Biopsy   BMBX 50% cellularity, igG kappa myeloma, IgG at 3600 mg/dl, FISH with monosomy of chromosome 13, gain 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    03/18/2015 - 07/28/2015 Chemotherapy   Velcade 1.6 mg/m2 discontinued secondary to intolerance   07/28/2015 Adverse Reaction   stool incontinence, weakness, collapse upon standing, felt to be secondary to velcade   11/09/2015 - 12/13/2015 Chemotherapy   cytoxan IV 300 mg/m2 administered X 2 doses only in addition to rev/dex   11/09/2015 - 06/08/2016 Chemotherapy   Revlimid/Dexamethasone    02/05/2017 - 07/09/2017 Chemotherapy   The patient had bortezomib SQ (VELCADE) chemo injection 1.75 mg, 1 mg/m2 = 1.75 mg (66.7 % of original dose 1.5 mg/m2), Subcutaneous,  Once, 1 of 8 cycles Dose modification: 1.5 mg/m2 (original dose 1.5 mg/m2, Cycle 1, Reason: Provider Judgment, Comment: Per Dr. Norma Fredrickson recommendations from [facility] 1 mg/m2 (original dose 1.5 mg/m2, Cycle 1, Reason: Provider Judgment)  for chemotherapy treatment.     07/09/2017 Adverse Reaction   Diarrhea    Chemotherapy   Pomalyst 2 mg (Days 1-21 every 28 days), Ixazomib 2.3 mg (days 1, 8, 15 every 28 days), and Dexamethasone 10 mg (weekly)- Rx's printed on 08/24/2017.  Treatment recommendations from Dr. Norma Fredrickson at [facility].   07/28/2019 -  Chemotherapy   The patient had daratumumab-hyaluronidase-fihj (DARZALEX FASPRO) 1800-30000 MG-UT/15ML chemo SQ injection 1,800 mg, 1,800 mg, Subcutaneous,  Once, 1 of 10 cycles Administration: 1,800 mg (07/28/2019)  for chemotherapy treatment.       CANCER STAGING: Cancer Staging No matching staging  information was found for the patient.   INTERVAL HISTORY:  Carol Bradley 79 y.o. female seen for follow-up of multiple myeloma and to initiate new treatment with daratumumab.  Appetite is 100%.  Energy levels are 50%.  Denies any new onset pains.  Numbness in the feet has been stable.  Leg swellings are chronic and stable.  Reports some shortness of breath on exertion.  REVIEW OF SYSTEMS:  Review of Systems  Respiratory: Positive for shortness of breath.   Cardiovascular: Positive for leg swelling.  Neurological: Positive for numbness.  All other systems reviewed and are negative.    PAST MEDICAL/SURGICAL HISTORY:  Past Medical History:  Diagnosis Date  . 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 CAD (coronary artery disease)   . COPD (chronic obstructive pulmonary disease) ([place])   . History of tobacco abuse   . Hyperlipidemia   . Hypertension   . 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 Multiple myeloma ([place])   . Vitamin B12 deficiency 04/15/2016   Overview:  Vitamin B12 level documented 155, January 2016 with the normal range being 211-924   Past Surgical History:  Procedure Laterality Date  . BACK SURGERY    . CARDIAC CATHETERIZATION  04/2011   right and left cath showing normal right heart pressures,but newly diagnosed coronary artery disease/drug eluting stent placed to RCA with residual disease in the proximal RCA and LAD and ramus, normal LV function and 60-65% EF  . COLONOSCOPY N/A 09/30/2014   Procedure: COLONOSCOPY;  Surgeon: Rogene Houston, MD;  Location: [facility];  Service: Endoscopy;  Laterality: N/A;  225  . CORONARY ANGIOPLASTY WITH STENT PLACEMENT  04/2011   mid RCA: 3.0 X38 mm Promus DES. Residual 40% disease proximally  . NECK SURGERY       SOCIAL HISTORY:  Social History   Socioeconomic History  . Marital status: Widowed    Spouse name: Not on file  . Number of children: Not on file  . Years of education: Not on file  .  Highest education level: Not on file  Occupational History  . 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 Smoking status: Former Smoker    Packs/day: 3.00    Years: 25.00    Pack years: 75.00    Types: Cigarettes    Quit date: 07/10/1994    Years since quitting: 25.0  . Smokeless tobacco: Never Used  Substance and Sexual Activity  . Alcohol use: No  . Drug use: Never  . Sexual activity: Not on file  Other Topics Concern  . Not on file  Social History Narrative  . Not on file   Social Determinants of Health   Financial Resource Strain:   . Difficulty of Paying Living Expenses: Not on file  Food Insecurity:   . Worried About Charity fundraiser in the Last Year: Not on file  . Ran Out of Food in the Last Year: Not on file  Transportation Needs:   . Lack of Transportation (Medical): Not on file  . Lack of Transportation (Non-Medical): Not on file  Physical Activity:   . Days of Exercise per Week: Not on file  . Minutes of Exercise per Session: Not on file  Stress:   . Feeling of Stress : Not on file  Social Connections:   . Frequency of Communication with Friends and Family: Not on file  . Frequency of Social Gatherings with Friends and Family: Not on file  . Attends Religious Services: Not on file  . Active Member of Clubs or Organizations: Not on file  . Attends Archivist Meetings: Not on file  . Marital Status: Not on file  Intimate Partner Violence:   . Fear of Current or Ex-Partner: Not on file  . Emotionally Abused: Not on file  . Physically Abused: Not on file  . Sexually Abused: Not on file    FAMILY HISTORY:  Family History  Problem Relation Age of Onset  . Heart failure Mother   . Cancer Father     CURRENT MEDICATIONS:  Outpatient Encounter Medications as of 07/28/2019  Medication Sig Note  . acyclovir (ZOVIRAX) 200 MG capsule TAKE ONE CAPSULE BY MOUTH TWICE DAILY   . aspirin EC 81 MG tablet Take 1 tablet (81 mg  total) by mouth daily.   Marland Kitchen atorvastatin (LIPITOR) 10 MG tablet TAKE ONE TABLET BY MOUTH DAILY   . Calcium Carbonate-Vit D-Min (CALCIUM 600+D PLUS MINERALS) 600-400 MG-UNIT CHEW Chew 1 tablet by mouth 3 (three) times daily.    . cyanocobalamin (,VITAMIN B-12,) 1000 MCG/ML injection Inject 1,000 mcg as directed every 30 (thirty) days.   . daratumumab-hyaluronidase-fihj (DARZALEX FASPRO) 1800-30000 MG-UT/15ML SOLN Inject 1,800 mg into the skin. Weekly weeks 1-8; every 2 weeks weeks 9-24; every 4 weeks after week 24   . dexamethasone (DECADRON) 2 MG tablet Take five tablets (92m) by mouth weekly. (Patient taking differently: Take five tablets (164m by mouth bi-weekly.) 12/26/2018: [facility] instructed patient to take medication weekly  . diltiazem (CARDIZEM CD) 240 MG 24 hr capsule Take  1 capsule (240 mg total) by mouth daily.   . magnesium oxide (MAG-OX) 400 (241.3 Mg) MG tablet Take 1 tablet by mouth 2 (two) times daily.   . metoprolol tartrate (LOPRESSOR) 25 MG tablet Take 1.5 tablets (37.5 mg total) by mouth 2 (two) times daily.   . montelukast (SINGULAIR) 10 MG tablet Take 1 tablet three days prior to your first treatment and the day of treatment. Then take one tablet on the day of treatment there after.   . pomalidomide (POMALYST) 2 MG capsule Take 1 capsule (2 mg total) by mouth daily. Take with water on days 1-21. Repeat every 28 days.   . potassium chloride (K-DUR) 10 MEQ tablet TAKE ONE TABLET BY MOUTH THREE TIMES DAILY.   Marland Kitchen Zoledronic Acid 4 MG SOLR Inject into the vein.   Marland Kitchen albuterol (VENTOLIN HFA) 108 (90 Base) MCG/ACT inhaler Inhale 2 puffs into the lungs every 6 (six) hours as needed for wheezing or shortness of breath. (Patient not taking: Reported on 07/28/2019)   . diazepam (VALIUM) 5 MG tablet TAKE ONE TABLET BY MOUTH EVERY 6 HOURS AS NEEDED FOR ANXIETY (Patient not taking: Reported on 07/28/2019)   . fluticasone (FLONASE) 50 MCG/ACT nasal spray Place 2 sprays into both nostrils as needed.     . furosemide (LASIX) 20 MG tablet Take 1 tablet (20 mg total) by mouth daily as needed (as needed for ankle swelling). (Patient not taking: Reported on 07/28/2019)   . guaiFENesin (MUCINEX) 600 MG 12 hr tablet Take 600 mg 2 (two) times daily as needed by mouth for cough or to loosen phlegm.   . ondansetron (ZOFRAN) 8 MG tablet Take 1 tablet (8 mg total) by mouth every 8 (eight) hours as needed for nausea or vomiting. (Patient not taking: Reported on 07/28/2019)   . pantoprazole (PROTONIX) 40 MG tablet Take 40 mg by mouth as needed.    . polyethylene glycol powder (GLYCOLAX/MIRALAX) 17 GM/SCOOP powder Take by mouth as needed.    . umeclidinium-vilanterol (ANORO ELLIPTA) 62.5-25 MCG/INH AEPB Inhale 1 puff into the lungs daily. (Patient not taking: Reported on 07/28/2019)   . [DISCONTINUED] dexamethasone (DECADRON) 4 MG tablet TAKE TWO & ONE-HALF (2 & 1/2) TABLETS (50 MG) BY MOUTH EVERY WEEK. (Patient taking differently: TAKE TWO & ONE-HALF (2 & 1/2) TABLETS (10 MG) BY MOUTH EVERY WEEK.)   . [DISCONTINUED] magnesium oxide (MAG-OX) 400 MG tablet TAKE ONE TABLET BY MOUTH TWICE DAILY. WHEN TAKING LASIX   . [DISCONTINUED] NINLARO 2.3 MG capsule Take one capsule by mouth once weekly for 3 weeks, then off 1 week    No facility-administered encounter medications on file as of 07/28/2019.    ALLERGIES:  Allergies  Allergen Reactions  . Lisinopril Cough  . Plavix [Clopidogrel Bisulfate] Swelling     PHYSICAL EXAM:  ECOG Performance status: 1  Vitals:   07/28/19 0845  BP: (!) 116/59  Pulse: 81  Resp: 18  Temp: (!) 97 F (36.1 C)  SpO2: 97%   Filed Weights   07/28/19 0845  Weight: 173 lb 3.2 oz (78.6 kg)    Physical Exam Vitals reviewed.  Constitutional:      Appearance: Normal appearance.  Cardiovascular:     Rate and Rhythm: Normal rate and regular rhythm.     Heart sounds: Normal heart sounds.  Pulmonary:     Effort: Pulmonary effort is normal.     Breath sounds: Normal breath  sounds.  Abdominal:     General: There is no distension.  Palpations: Abdomen is soft. There is no mass.  Musculoskeletal:        General: No swelling.  Skin:    General: Skin is warm.  Neurological:     General: No focal deficit present.     Mental Status: She is alert and oriented to person, place, and time.  Psychiatric:        Mood and Affect: Mood normal.        Behavior: Behavior normal.      LABORATORY DATA:  I have reviewed the labs as listed.  CBC    Component Value Date/Time   WBC 10.9 (H) 07/28/2019 0828   RBC 2.76 (L) 07/28/2019 0828   HGB 9.0 (L) 07/28/2019 0828   HCT 30.2 (L) 07/28/2019 0828   PLT 238 07/28/2019 0828   MCV 109.4 (H) 07/28/2019 0828   MCH 32.6 07/28/2019 0828   MCHC 29.8 (L) 07/28/2019 0828   RDW 16.5 (H) 07/28/2019 0828   LYMPHSABS 2.2 07/28/2019 0828   MONOABS 1.4 (H) 07/28/2019 0828   EOSABS 0.1 07/28/2019 0828   BASOSABS 0.1 07/28/2019 0828   CMP Latest Ref Rng & Units 07/28/2019 06/26/2019 06/12/2019  Glucose 70 - 99 mg/dL 98 99 108(H)  BUN 8 - 23 mg/dL 28(H) 26(H) 20  Creatinine 0.44 - 1.00 mg/dL 1.27(H) 1.40(H) 1.56(H)  Sodium 135 - 145 mmol/L 142 140 141  Potassium 3.5 - 5.1 mmol/L 4.0 3.7 3.5  Chloride 98 - 111 mmol/L 111 106 104  CO2 22 - 32 mmol/L _0 Calcium 8.9 - 10.3 mg/dL 8.5(L) 8.0(L) 8.9  Total Protein 6.5 - 8.1 g/dL 7.0 6.0(L) 6.5  Total Bilirubin 0.3 - 1.2 mg/dL 0.6 0.6 0.6  Alkaline Phos 38 - 126 U/L 77 76 79  AST 15 - 41 U/L 35 26 19  ALT 0 - 44 U/L _1 DIAGNOSTIC IMAGING:  I have independently reviewed the scans and discussed with the patient.   I have reviewed Venita Lick LPN's note and agree with the documentation.  I personally performed a face-to-face visit, made revisions and my assessment and plan is as follows.    ASSESSMENT & PLAN:   Multiple myeloma ([place]) 1.  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 gain, -13 -  Velcade and dexamethasone from 03/18/2015 through 07/28/2015, Velcade discontinued secondary to neuropathy. -Revlimid and dexamethasone from February 2017 through 05/2016, achieving negative SPEP and immunofixation. - Bone marrow biopsy on 11/14/2016 shows less than 2% plasma cells, started maintenance Velcade on 11/2016 through 07/2017, stopped due to diarrhea. -Ninlaro, Pomalyst and dexamethasone from 08/24/2017 through December 2020 with progression.  -Labs from [facility] on 06/23/2019 shows M spike of 1.12 g.  Kappa light chains 629 with lambda light chains 8.8 and ratio 70.9.  -She will be started on daratumumab, pomalidomide and dexamethasone.  We discussed the regimen in detail.  We discussed the side effects in detail.  She will receive weekly subcu daratumumab for the first 2 cycles.  We started her on Singulair 10 mg on Friday.  We have also given albuterol inhaler. -I have reviewed her labs.  She will proceed with her first treatment today.  She started Pomalyst today.  She will take 20 mg dexamethasone today.  This will be cut back to 10 mg weekly.  I will see her back in 1 week for follow-up.  2.  Left orbit pain: - MRI of  the orbits on 03/21/2019 was negative. -She reported some vision problems also.  I have recommended evaluation by ophthalmology.  3.  Bone strengthening: -She will continue Zometa 3 mg every 3 months.  4.  Normocytic anemia: -Hemoglobin today is 9.  She will take Aranesp injection today.  5.  CKD: -Her creatinine today is 1.27.  Her baseline is between 1.2 and 1.5.  Total time spent is 30 minutes with more than 50% of the time spent face-to-face discussing new treatment plan, counseling and coordination of care.    Orders placed this encounter:  Orders Placed This Encounter  Procedures  . CBC with Differential  . Comprehensive metabolic panel  . Physician Communication-Daratumumab      Derek Jack, MD [place]  [phone]

## 2019-07-28 NOTE — Patient Instructions (Addendum)
[facility] at [facility] Discharge Instructions  You were seen today by Dr. Katragadda. He went over your recent lab results. He will see you back in 1 week for labs, treatment and follow up.   Thank you for choosing [facility] at [facility] to provide your oncology and hematology care.  To afford each patient quality time with our provider, please arrive at least 15 minutes before your scheduled appointment time.   If you have a lab appointment with the Cancer Center please come in thru the  Main Entrance and check in at the main information desk  You need to re-schedule your appointment should you arrive 10 or more minutes late.  We strive to give you quality time with our providers, and arriving late affects you and other patients whose appointments are after yours.  Also, if you no show three or more times for appointments you may be dismissed from the clinic at the providers discretion.     Again, thank you for choosing [facility].  Our hope is that these requests will decrease the amount of time that you wait before being seen by our physicians.       _____________________________________________________________  Should you have questions after your visit to  Cancer Center, please contact our office at [phone] between the hours of 8:00 a.m. and 4:30 p.m.  Voicemails left after 4:00 p.m. will not be returned until the following business day.  For prescription refill requests, have your pharmacy contact our office and allow 72 hours.    Cancer Center Support Programs:   > Cancer Support Group  2nd Tuesday of the month 1pm-2pm, Journey Room    

## 2019-07-29 ENCOUNTER — Telehealth (HOSPITAL_COMMUNITY): Payer: Self-pay

## 2019-07-29 ENCOUNTER — Ambulatory Visit (HOSPITAL_COMMUNITY): Payer: Medicare Other

## 2019-07-29 NOTE — Telephone Encounter (Signed)
24 hour follow up- left message for her to call with any concerns or questions.

## 2019-07-31 LAB — PRETREATMENT RBC PHENOTYPE

## 2019-08-04 ENCOUNTER — Inpatient Hospital Stay (HOSPITAL_COMMUNITY): Payer: Medicare Other

## 2019-08-04 ENCOUNTER — Other Ambulatory Visit: Payer: Self-pay

## 2019-08-04 ENCOUNTER — Encounter (HOSPITAL_COMMUNITY): Payer: Self-pay | Admitting: Hematology

## 2019-08-04 ENCOUNTER — Inpatient Hospital Stay (HOSPITAL_BASED_OUTPATIENT_CLINIC_OR_DEPARTMENT_OTHER): Payer: Medicare Other | Admitting: Hematology

## 2019-08-04 VITALS — BP 106/46 | HR 88 | Temp 97.6°F | Resp 20

## 2019-08-04 VITALS — BP 117/48 | HR 89 | Temp 97.1°F | Resp 20 | Wt 172.0 lb

## 2019-08-04 DIAGNOSIS — Z5111 Encounter for antineoplastic chemotherapy: Secondary | ICD-10-CM | POA: Diagnosis not present

## 2019-08-04 DIAGNOSIS — C9000 Multiple myeloma not having achieved remission: Secondary | ICD-10-CM | POA: Diagnosis not present

## 2019-08-04 LAB — COMPREHENSIVE METABOLIC PANEL
ALT: 29 U/L (ref 0–44)
AST: 24 U/L (ref 15–41)
Albumin: 2.6 g/dL — ABNORMAL LOW (ref 3.5–5.0)
Alkaline Phosphatase: 70 U/L (ref 38–126)
Anion gap: 9 (ref 5–15)
BUN: 25 mg/dL — ABNORMAL HIGH (ref 8–23)
CO2: 21 mmol/L — ABNORMAL LOW (ref 22–32)
Calcium: 8 mg/dL — ABNORMAL LOW (ref 8.9–10.3)
Chloride: 109 mmol/L (ref 98–111)
Creatinine, Ser: 1.45 mg/dL — ABNORMAL HIGH (ref 0.44–1.00)
GFR calc Af Amer: 40 mL/min — ABNORMAL LOW (ref >60)
GFR calc non Af Amer: 34 mL/min — ABNORMAL LOW (ref >60)
Glucose, Bld: 103 mg/dL — ABNORMAL HIGH (ref 70–99)
Potassium: 3.5 mmol/L (ref 3.5–5.1)
Sodium: 139 mmol/L (ref 135–145)
Total Bilirubin: 0.9 mg/dL (ref 0.3–1.2)
Total Protein: 6 g/dL — ABNORMAL LOW (ref 6.5–8.1)

## 2019-08-04 LAB — CBC WITH DIFFERENTIAL/PLATELET
Abs Immature Granulocytes: 0.03 10*3/uL (ref 0.00–0.07)
Basophils Absolute: 0 10*3/uL (ref 0.0–0.1)
Basophils Relative: 0 %
Eosinophils Absolute: 0.2 10*3/uL (ref 0.0–0.5)
Eosinophils Relative: 2 %
HCT: 27 % — ABNORMAL LOW (ref 36.0–46.0)
Hemoglobin: 8.2 g/dL — ABNORMAL LOW (ref 12.0–15.0)
Immature Granulocytes: 0 %
Lymphocytes Relative: 7 %
Lymphs Abs: 0.5 10*3/uL — ABNORMAL LOW (ref 0.7–4.0)
MCH: 32.2 pg (ref 26.0–34.0)
MCHC: 30.4 g/dL (ref 30.0–36.0)
MCV: 105.9 fL — ABNORMAL HIGH (ref 80.0–100.0)
Monocytes Absolute: 1 10*3/uL (ref 0.1–1.0)
Monocytes Relative: 14 %
Neutro Abs: 5.6 10*3/uL (ref 1.7–7.7)
Neutrophils Relative %: 77 %
Platelets: 185 10*3/uL (ref 150–400)
RBC: 2.55 MIL/uL — ABNORMAL LOW (ref 3.87–5.11)
RDW: 16.4 % — ABNORMAL HIGH (ref 11.5–15.5)
WBC: 7.3 10*3/uL (ref 4.0–10.5)
nRBC: 0 % (ref 0.0–0.2)

## 2019-08-04 LAB — IRON AND TIBC
Iron: 56 ug/dL (ref 28–170)
Saturation Ratios: 18 % (ref 10.4–31.8)
TIBC: 309 ug/dL (ref 250–450)
UIBC: 253 ug/dL

## 2019-08-04 LAB — FERRITIN: Ferritin: 62 ng/mL (ref 11–307)

## 2019-08-04 MED ORDER — MONTELUKAST SODIUM 10 MG PO TABS: 10.0000 mg | ORAL_TABLET | Freq: Once | ORAL | Status: DC

## 2019-08-04 MED ORDER — DEXAMETHASONE 4 MG PO TABS
10.0000 mg | ORAL_TABLET | Freq: Once | ORAL | Status: AC
  Administered 2019-08-04: 10 mg via ORAL
  Filled 2019-08-04: qty 3

## 2019-08-04 MED ORDER — DARATUMUMAB-HYALURONIDASE-FIHJ 1800-30000 MG-UT/15ML ~~LOC~~ SOLN
1800.0000 mg | Freq: Once | SUBCUTANEOUS | Status: AC
  Administered 2019-08-04: 11:00:00 1800 mg via SUBCUTANEOUS
  Filled 2019-08-04: qty 15

## 2019-08-04 MED ORDER — ACETAMINOPHEN 325 MG PO TABS
650.0000 mg | ORAL_TABLET | Freq: Once | ORAL | Status: AC
  Administered 2019-08-04: 10:00:00 650 mg via ORAL
  Filled 2019-08-04: qty 2

## 2019-08-04 MED ORDER — SODIUM CHLORIDE 0.9 % IV SOLN
510.0000 mg | Freq: Once | INTRAVENOUS | Status: AC
  Administered 2019-08-04: 510 mg via INTRAVENOUS
  Filled 2019-08-04: qty 510

## 2019-08-04 MED ORDER — DIPHENHYDRAMINE HCL 25 MG PO CAPS
50.0000 mg | ORAL_CAPSULE | Freq: Once | ORAL | Status: AC
  Administered 2019-08-04: 50 mg via ORAL
  Filled 2019-08-04: qty 2

## 2019-08-04 NOTE — Progress Notes (Signed)
Patient has been assessed, vital signs and labs have been reviewed by Dr. Delton Coombes. ANC, Creatinine, LFTs, and Platelets are within treatment parameters per Dr. Delton Coombes. She will also need 1 dose of IV Feraheme.The patient is good to proceed with treatment at this time.

## 2019-08-04 NOTE — Progress Notes (Signed)
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    03/18/2015 - 07/28/2015 Chemotherapy   Velcade 1.6 mg/m2 discontinued secondary to intolerance   07/28/2015 Adverse Reaction   stool incontinence, weakness, collapse upon standing, felt to be secondary to velcade   11/09/2015 - 12/13/2015 Chemotherapy   cytoxan IV 300 mg/m2 administered X 2 doses only in addition to rev/dex   11/09/2015 - 06/08/2016 Chemotherapy   Revlimid/Dexamethasone    02/05/2017 - 07/09/2017 Chemotherapy   The patient had bortezomib SQ (VELCADE) chemo injection 1.75 mg, 1 mg/m2 = 1.75 mg (66.7 % of original dose 1.5 mg/m2), Subcutaneous,  Once, 1 of 8 cycles Dose modification: 1.5 mg/m2 (original dose 1.5 mg/m2, Cycle 1, Reason: Provider Judgment, Comment: Per Dr. Norma Fredrickson recommendations from [facility] 1 mg/m2 (original dose 1.5 mg/m2, Cycle 1, Reason: Provider Judgment)  for chemotherapy treatment.     07/09/2017 Adverse Reaction   Diarrhea    Chemotherapy   Pomalyst 2 mg (Days 1-21 every 28 days), Ixazomib 2.3 mg (days 1, 8, 15 every 28 days), and Dexamethasone 10 mg (weekly)- Rx's printed on 08/24/2017.  Treatment recommendations from Dr. Norma Fredrickson at [facility].   07/28/2019 -  Chemotherapy   The patient had daratumumab-hyaluronidase-fihj (DARZALEX FASPRO) 1800-30000 MG-UT/15ML chemo SQ injection 1,800 mg, 1,800 mg, Subcutaneous,  Once, 1 of 10 cycles Administration: 1,800 mg (07/28/2019), 1,800 mg (08/04/2019)  for chemotherapy treatment.       CANCER STAGING: Cancer  Staging No matching staging information was found for the patient.   INTERVAL HISTORY:  Carol Bradley 79 y.o. female seen for follow-up of multiple myeloma and toxicity assessment prior to next dose of treatment.  Denies any bleeding per rectum or melena.  Appetite is 50%.  Energy levels are 20%.  Shortness of breath on exertion is worse.  Numbness in the feet has been stable.  Leg swelling is also stable.  REVIEW OF SYSTEMS:  Review of Systems  Respiratory: Positive for shortness of breath.   Cardiovascular: Positive for leg swelling.  Neurological: Positive for numbness.  All other systems reviewed and are negative.    PAST MEDICAL/SURGICAL HISTORY:  Past Medical History:  Diagnosis Date  . 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 CAD (coronary artery disease)   . COPD (chronic obstructive pulmonary disease) ([place])   . History of tobacco abuse   . Hyperlipidemia   . Hypertension   . 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 Multiple myeloma ([place])   . Vitamin B12 deficiency 04/15/2016   Overview:  Vitamin B12 level documented 155, January 2016 with the normal range being 211-924   Past Surgical History:  Procedure Laterality Date  . BACK SURGERY    . CARDIAC CATHETERIZATION  04/2011   right and left cath showing normal right heart pressures,but newly diagnosed coronary artery disease/drug eluting stent placed to RCA with residual disease in the proximal RCA and LAD and ramus, normal LV function and 60-65% EF  . COLONOSCOPY N/A 09/30/2014   Procedure: COLONOSCOPY;  Surgeon: Rogene Houston, MD;  Location: [facility]  ENDO SUITE;  Service: Endoscopy;  Laterality: N/A;  225  . CORONARY ANGIOPLASTY WITH STENT PLACEMENT  04/2011   mid RCA: 3.0 X38 mm Promus DES. Residual 40% disease proximally  . NECK SURGERY       SOCIAL HISTORY:  Social History   Socioeconomic History  . Marital status: Widowed    Spouse name: Not on file  . Number of children: Not on file  .  Years of education: Not on file  . Highest education level: Not on file  Occupational History  . 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 Smoking status: Former Smoker    Packs/day: 3.00    Years: 25.00    Pack years: 75.00    Types: Cigarettes    Quit date: 07/10/1994    Years since quitting: 25.0  . Smokeless tobacco: Never Used  Substance and Sexual Activity  . Alcohol use: No  . Drug use: Never  . Sexual activity: Not on file  Other Topics Concern  . Not on file  Social History Narrative  . Not on file   Social Determinants of Health   Financial Resource Strain:   . Difficulty of Paying Living Expenses: Not on file  Food Insecurity:   . Worried About Charity fundraiser in the Last Year: Not on file  . Ran Out of Food in the Last Year: Not on file  Transportation Needs:   . Lack of Transportation (Medical): Not on file  . Lack of Transportation (Non-Medical): Not on file  Physical Activity:   . Days of Exercise per Week: Not on file  . Minutes of Exercise per Session: Not on file  Stress:   . Feeling of Stress : Not on file  Social Connections:   . Frequency of Communication with Friends and Family: Not on file  . Frequency of Social Gatherings with Friends and Family: Not on file  . Attends Religious Services: Not on file  . Active Member of Clubs or Organizations: Not on file  . Attends Archivist Meetings: Not on file  . Marital Status: Not on file  Intimate Partner Violence:   . Fear of Current or Ex-Partner: Not on file  . Emotionally Abused: Not on file  . Physically Abused: Not on file  . Sexually Abused: Not on file    FAMILY HISTORY:  Family History  Problem Relation Age of Onset  . Heart failure Mother   . Cancer Father     CURRENT MEDICATIONS:  Outpatient Encounter Medications as of 08/04/2019  Medication Sig Note  . acyclovir (ZOVIRAX) 200 MG capsule TAKE ONE CAPSULE BY MOUTH TWICE DAILY   . aspirin EC  81 MG tablet Take 1 tablet (81 mg total) by mouth daily.   Marland Kitchen atorvastatin (LIPITOR) 10 MG tablet TAKE ONE TABLET BY MOUTH DAILY   . Calcium Carbonate-Vit D-Min (CALCIUM 600+D PLUS MINERALS) 600-400 MG-UNIT CHEW Chew 1 tablet by mouth 3 (three) times daily.    . cyanocobalamin (,VITAMIN B-12,) 1000 MCG/ML injection Inject 1,000 mcg as directed every 30 (thirty) days.   . daratumumab-hyaluronidase-fihj (DARZALEX FASPRO) 1800-30000 MG-UT/15ML SOLN Inject 1,800 mg into the skin. Weekly weeks 1-8; every 2 weeks weeks 9-24; every 4 weeks after week 24   . dexamethasone (DECADRON) 2 MG tablet Take five tablets ('10mg'$ ) by mouth weekly. (Patient taking differently: Take five tablets ('10mg'$ ) by mouth bi-weekly.) 12/26/2018: [facility] instructed patient to take medication weekly  . diltiazem (CARDIZEM CD)  240 MG 24 hr capsule Take 1 capsule (240 mg total) by mouth daily.   Marland Kitchen guaiFENesin (MUCINEX) 600 MG 12 hr tablet Take 600 mg 2 (two) times daily as needed by mouth for cough or to loosen phlegm.   . magnesium oxide (MAG-OX) 400 (241.3 Mg) MG tablet Take 1 tablet by mouth 2 (two) times daily.   . metoprolol tartrate (LOPRESSOR) 25 MG tablet Take 1.5 tablets (37.5 mg total) by mouth 2 (two) times daily.   . montelukast (SINGULAIR) 10 MG tablet Take 1 tablet three days prior to your first treatment and the day of treatment. Then take one tablet on the day of treatment there after.   . polyethylene glycol powder (GLYCOLAX/MIRALAX) 17 GM/SCOOP powder Take by mouth as needed.    . pomalidomide (POMALYST) 2 MG capsule Take 1 capsule (2 mg total) by mouth daily. Take with water on days 1-21. Repeat every 28 days.   . potassium chloride (K-DUR) 10 MEQ tablet TAKE ONE TABLET BY MOUTH THREE TIMES DAILY.   Marland Kitchen umeclidinium-vilanterol (ANORO ELLIPTA) 62.5-25 MCG/INH AEPB Inhale 1 puff into the lungs daily.   . Zoledronic Acid 4 MG SOLR Inject into the vein.   Marland Kitchen albuterol (VENTOLIN HFA) 108 (90 Base) MCG/ACT inhaler Inhale 2 puffs  into the lungs every 6 (six) hours as needed for wheezing or shortness of breath. (Patient not taking: Reported on 07/28/2019)   . diazepam (VALIUM) 5 MG tablet TAKE ONE TABLET BY MOUTH EVERY 6 HOURS AS NEEDED FOR ANXIETY (Patient not taking: Reported on 07/28/2019)   . fluticasone (FLONASE) 50 MCG/ACT nasal spray Place 2 sprays into both nostrils as needed.    . furosemide (LASIX) 20 MG tablet Take 1 tablet (20 mg total) by mouth daily as needed (as needed for ankle swelling). (Patient not taking: Reported on 07/28/2019)   . ondansetron (ZOFRAN) 8 MG tablet Take 1 tablet (8 mg total) by mouth every 8 (eight) hours as needed for nausea or vomiting. (Patient not taking: Reported on 07/28/2019)   . pantoprazole (PROTONIX) 40 MG tablet Take 40 mg by mouth as needed.     No facility-administered encounter medications on file as of 08/04/2019.    ALLERGIES:  Allergies  Allergen Reactions  . Lisinopril Cough  . Plavix [Clopidogrel Bisulfate] Swelling     PHYSICAL EXAM:  ECOG Performance status: 1  Vitals:   08/04/19 0902  BP: (!) 117/48  Pulse: 89  Resp: 20  Temp: (!) 97.1 F (36.2 C)  SpO2: 98%   Filed Weights   08/04/19 0902  Weight: 172 lb (78 kg)    Physical Exam Vitals reviewed.  Constitutional:      Appearance: Normal appearance.  Cardiovascular:     Rate and Rhythm: Normal rate and regular rhythm.     Heart sounds: Normal heart sounds.  Pulmonary:     Effort: Pulmonary effort is normal.     Breath sounds: Normal breath sounds.  Abdominal:     General: There is no distension.     Palpations: Abdomen is soft. There is no mass.  Musculoskeletal:        General: No swelling.  Skin:    General: Skin is warm.  Neurological:     General: No focal deficit present.     Mental Status: Carol Bradley is alert and oriented to person, place, and time.  Psychiatric:        Mood and Affect: Mood normal.        Behavior: Behavior normal.  LABORATORY DATA:  I have reviewed the labs  as listed.  CBC    Component Value Date/Time   WBC 7.3 08/04/2019 0836   RBC 2.55 (L) 08/04/2019 0836   HGB 8.2 (L) 08/04/2019 0836   HCT 27.0 (L) 08/04/2019 0836   PLT 185 08/04/2019 0836   MCV 105.9 (H) 08/04/2019 0836   MCH 32.2 08/04/2019 0836   MCHC 30.4 08/04/2019 0836   RDW 16.4 (H) 08/04/2019 0836   LYMPHSABS 0.5 (L) 08/04/2019 0836   MONOABS 1.0 08/04/2019 0836   EOSABS 0.2 08/04/2019 0836   BASOSABS 0.0 08/04/2019 0836   CMP Latest Ref Rng & Units 08/04/2019 07/28/2019 06/26/2019  Glucose 70 - 99 mg/dL 103(H) 98 99  BUN 8 - 23 mg/dL 25(H) 28(H) 26(H)  Creatinine 0.44 - 1.00 mg/dL 1.45(H) 1.27(H) 1.40(H)  Sodium 135 - 145 mmol/L 139 142 140  Potassium 3.5 - 5.1 mmol/L 3.5 4.0 3.7  Chloride 98 - 111 mmol/L 109 111 106  CO2 22 - 32 mmol/L 21(L) 24 25  Calcium 8.9 - 10.3 mg/dL 8.0(L) 8.5(L) 8.0(L)  Total Protein 6.5 - 8.1 g/dL 6.0(L) 7.0 6.0(L)  Total Bilirubin 0.3 - 1.2 mg/dL 0.9 0.6 0.6  Alkaline Phos 38 - 126 U/L 70 77 76  AST 15 - 41 U/L 24 35 26  ALT 0 - 44 U/L '29 31 28       '$ DIAGNOSTIC IMAGING:  I have independently reviewed the scans and discussed with the patient.   I have reviewed Venita Lick LPN's note and agree with the documentation.  I personally performed a face-to-face visit, made revisions and my assessment and plan is as follows.    ASSESSMENT & PLAN:   Multiple myeloma ([place]) 1.  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 -Revlimid and dexamethasone from February 2017 through 05/2016, achieving negative SPEP and immunofixation. - Bone marrow biopsy on 11/14/2016 shows less than 2% plasma cells, started maintenance Velcade on 11/2016 through 07/2017, stopped due to diarrhea. -Ninlaro, Pomalyst and dexamethasone from 08/24/2017 through December 2020 with progression.  -Labs from  [facility] on 06/23/2019 shows M spike of 1.12 g.  Kappa light chains 629 with lambda light chains 8.8 and ratio 70.9.  -Daratumumab, pomalidomide and dexamethasone started on 07/28/2019. -Carol Bradley has tolerated subcu preparation of daratumumab very well.  Carol Bradley is taking pomalidomide without any major problems. -I have reviewed Carol Bradley labs today.  Carol Bradley will proceed with Carol Bradley daratumumab week 2 dose today. -We will reevaluate Carol Bradley in 1 week with labs and treatment.  2.  Left orbit pain: - MRI of the orbits on 03/21/2019 was negative. -Carol Bradley reported some vision problems also.  I have recommended evaluation by ophthalmology.  3.  Bisphosphonate therapy: -Carol Bradley will continue Zometa 3 mg every 3 months.  4.  Normocytic anemia: -Carol Bradley received Aranesp injection on 07/28/2019.  Hemoglobin decreased to 8.2. -Carol Bradley denies any bleeding per rectum or melena.  We will check Carol Bradley ferritin and iron panel.  We will give Carol Bradley Feraheme today and next week.  5.  CKD: -Creatinine today is 1.43.  Baseline is between 1.2 and 1.5.  Total time spent is 30 minutes with more than 50% of the time spent face-to-face discussing new treatment plan, counseling and coordination of care.    Orders placed this encounter:  Orders Placed This Encounter  Procedures  . Ferritin  .  Iron and TIBC      Derek Jack, MD [place] [phone]

## 2019-08-04 NOTE — Progress Notes (Signed)
0932- Patient seeb by Dr. Delton Coombes and all lab results and VS reviewed. Per MD, ok to proceed with treatment today. Also give Feraheme x1 and draw ferritin and TIBC.   Carol Bradley tolerated treatment without incident or complaint. VSS upon completion of treatment and pt observed for one hour post injection per MD orders. Discharged via wheelchair in satisfactory condition with follow up instructions.

## 2019-08-04 NOTE — Assessment & Plan Note (Signed)
1.  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 -Revlimid and dexamethasone from February 2017 through 05/2016, achieving negative SPEP and immunofixation. - Bone marrow biopsy on 11/14/2016 shows less than 2% plasma cells, started maintenance Velcade on 11/2016 through 07/2017, stopped due to diarrhea. -Ninlaro, Pomalyst and dexamethasone from 08/24/2017 through December 2020 with progression.  -Labs from [facility] on 06/23/2019 shows M spike of 1.12 g.  Kappa light chains 629 with lambda light chains 8.8 and ratio 70.9.  -Daratumumab, pomalidomide and dexamethasone started on 07/28/2019. -She has tolerated subcu preparation of daratumumab very well.  She is taking pomalidomide without any major problems. -I have reviewed her labs today.  She will proceed with her daratumumab week 2 dose today. -We will reevaluate her in 1 week with labs and treatment.  2.  Left orbit pain: - MRI of the orbits on 03/21/2019 was negative. -She reported some vision problems also.  I have recommended evaluation by ophthalmology.  3.  Bisphosphonate therapy: -She will continue Zometa 3 mg every 3 months.  4.  Normocytic anemia: -She received Aranesp injection on 07/28/2019.  Hemoglobin decreased to 8.2. -She denies any bleeding per rectum or melena.  We will check her ferritin and iron panel.  We will give her Feraheme today and next week.  5.  CKD: -Creatinine today is 1.43.  Baseline is between 1.2 and 1.5.

## 2019-08-04 NOTE — Patient Instructions (Signed)
[facility] Discharge Instructions for Patients Receiving Chemotherapy   Beginning January 23rd 2017 lab work for the Cancer Center will be done in the  Main lab at [place] on 1st floor. If you have a lab appointment with the Cancer Center please come in thru the  Main Entrance and check in at the main information desk   Today you received the following chemotherapy agents Daratumumab  To help prevent nausea and vomiting after your treatment, we encourage you to take your nausea medication   If you develop nausea and vomiting, or diarrhea that is not controlled by your medication, call the clinic.  The clinic phone number is [phone]. Office hours are Monday-Friday 8:30am-5:00pm.  BELOW ARE SYMPTOMS THAT SHOULD BE REPORTED IMMEDIATELY:  *FEVER GREATER THAN 101.0 F  *CHILLS WITH OR WITHOUT FEVER  NAUSEA AND VOMITING THAT IS NOT CONTROLLED WITH YOUR NAUSEA MEDICATION  *UNUSUAL SHORTNESS OF BREATH  *UNUSUAL BRUISING OR BLEEDING  TENDERNESS IN MOUTH AND THROAT WITH OR WITHOUT PRESENCE OF ULCERS  *URINARY PROBLEMS  *BOWEL PROBLEMS  UNUSUAL RASH Items with * indicate a potential emergency and should be followed up as soon as possible. If you have an emergency after office hours please contact your primary care physician or go to the nearest emergency department.  Please call the clinic during office hours if you have any questions or concerns.   You may also contact the Patient Navigator at [phone] should you have any questions or need assistance in obtaining follow up care.      Resources For Cancer Patients and their Caregivers ? American Cancer Society: Can assist with transportation, wigs, general needs, runs Look Good Feel Better.        1-888-227-6333 ? Cancer Care: Provides financial assistance, online support groups, medication/co-pay assistance.  1-800-813-HOPE (4673) ? [facility] Assists [place]  cancer patients and their families through emotional , educational and financial support.  336-427-4357 ? [place] DSS Where to apply for food stamps, Medicaid and utility assistance. 336-342-1394 ? [facility]: Transportation to medical appointments. 336-347-2287 ? Social Security Administration: May apply for disability if have a Stage IV cancer. 336-342-7796 1-800-772-1213 ? [place] Aging, Disability and Transit Services: Assists with nutrition, care and transit needs. 336-349-2343          

## 2019-08-04 NOTE — Patient Instructions (Addendum)
[facility] at [facility] Discharge Instructions  You were seen today by Dr. Katragadda. He went over your recent lab results. He will see you back in 1 week for labs and follow up.   Thank you for choosing [facility] at [facility] to provide your oncology and hematology care.  To afford each patient quality time with our provider, please arrive at least 15 minutes before your scheduled appointment time.   If you have a lab appointment with the Cancer Center please come in thru the  Main Entrance and check in at the main information desk  You need to re-schedule your appointment should you arrive 10 or more minutes late.  We strive to give you quality time with our providers, and arriving late affects you and other patients whose appointments are after yours.  Also, if you no show three or more times for appointments you may be dismissed from the clinic at the providers discretion.     Again, thank you for choosing [facility].  Our hope is that these requests will decrease the amount of time that you wait before being seen by our physicians.       _____________________________________________________________  Should you have questions after your visit to [facility], please contact our office at [phone] between the hours of 8:00 a.m. and 4:30 p.m.  Voicemails left after 4:00 p.m. will not be returned until the following business day.  For prescription refill requests, have your pharmacy contact our office and allow 72 hours.    Cancer Center Support Programs:   > Cancer Support Group  2nd Tuesday of the month 1pm-2pm, Journey Room    

## 2019-08-06 ENCOUNTER — Other Ambulatory Visit (HOSPITAL_COMMUNITY): Payer: Self-pay | Admitting: *Deleted

## 2019-08-06 DIAGNOSIS — C9000 Multiple myeloma not having achieved remission: Secondary | ICD-10-CM

## 2019-08-06 MED ORDER — POMALIDOMIDE 2 MG PO CAPS: ORAL_CAPSULE | ORAL | 0 refills | Status: DC

## 2019-08-11 ENCOUNTER — Inpatient Hospital Stay (HOSPITAL_BASED_OUTPATIENT_CLINIC_OR_DEPARTMENT_OTHER): Payer: Medicare Other | Admitting: Hematology

## 2019-08-11 ENCOUNTER — Inpatient Hospital Stay (HOSPITAL_COMMUNITY): Payer: Medicare Other

## 2019-08-11 ENCOUNTER — Other Ambulatory Visit: Payer: Self-pay

## 2019-08-11 ENCOUNTER — Inpatient Hospital Stay (HOSPITAL_COMMUNITY): Payer: Medicare Other | Attending: Hematology

## 2019-08-11 VITALS — BP 113/61 | HR 81 | Temp 96.9°F | Resp 18

## 2019-08-11 DIAGNOSIS — Z7952 Long term (current) use of systemic steroids: Secondary | ICD-10-CM | POA: Diagnosis not present

## 2019-08-11 DIAGNOSIS — Z9221 Personal history of antineoplastic chemotherapy: Secondary | ICD-10-CM | POA: Insufficient documentation

## 2019-08-11 DIAGNOSIS — C9000 Multiple myeloma not having achieved remission: Secondary | ICD-10-CM | POA: Diagnosis not present

## 2019-08-11 DIAGNOSIS — J449 Chronic obstructive pulmonary disease, unspecified: Secondary | ICD-10-CM | POA: Insufficient documentation

## 2019-08-11 DIAGNOSIS — H5712 Ocular pain, left eye: Secondary | ICD-10-CM | POA: Insufficient documentation

## 2019-08-11 DIAGNOSIS — R42 Dizziness and giddiness: Secondary | ICD-10-CM | POA: Diagnosis not present

## 2019-08-11 DIAGNOSIS — I2510 Atherosclerotic heart disease of native coronary artery without angina pectoris: Secondary | ICD-10-CM | POA: Insufficient documentation

## 2019-08-11 DIAGNOSIS — Z5112 Encounter for antineoplastic immunotherapy: Secondary | ICD-10-CM | POA: Insufficient documentation

## 2019-08-11 DIAGNOSIS — Z79899 Other long term (current) drug therapy: Secondary | ICD-10-CM | POA: Diagnosis not present

## 2019-08-11 DIAGNOSIS — E538 Deficiency of other specified B group vitamins: Secondary | ICD-10-CM

## 2019-08-11 DIAGNOSIS — J029 Acute pharyngitis, unspecified: Secondary | ICD-10-CM | POA: Insufficient documentation

## 2019-08-11 DIAGNOSIS — I129 Hypertensive chronic kidney disease with stage 1 through stage 4 chronic kidney disease, or unspecified chronic kidney disease: Secondary | ICD-10-CM | POA: Diagnosis not present

## 2019-08-11 DIAGNOSIS — N183 Chronic kidney disease, stage 3 unspecified: Secondary | ICD-10-CM

## 2019-08-11 DIAGNOSIS — D509 Iron deficiency anemia, unspecified: Secondary | ICD-10-CM

## 2019-08-11 DIAGNOSIS — D631 Anemia in chronic kidney disease: Secondary | ICD-10-CM | POA: Diagnosis not present

## 2019-08-11 DIAGNOSIS — R531 Weakness: Secondary | ICD-10-CM | POA: Insufficient documentation

## 2019-08-11 DIAGNOSIS — Z87891 Personal history of nicotine dependence: Secondary | ICD-10-CM | POA: Diagnosis not present

## 2019-08-11 DIAGNOSIS — N189 Chronic kidney disease, unspecified: Secondary | ICD-10-CM | POA: Insufficient documentation

## 2019-08-11 DIAGNOSIS — Z7982 Long term (current) use of aspirin: Secondary | ICD-10-CM | POA: Diagnosis not present

## 2019-08-11 DIAGNOSIS — R110 Nausea: Secondary | ICD-10-CM | POA: Insufficient documentation

## 2019-08-11 DIAGNOSIS — D801 Nonfamilial hypogammaglobulinemia: Secondary | ICD-10-CM

## 2019-08-11 DIAGNOSIS — R5383 Other fatigue: Secondary | ICD-10-CM | POA: Insufficient documentation

## 2019-08-11 DIAGNOSIS — E785 Hyperlipidemia, unspecified: Secondary | ICD-10-CM | POA: Diagnosis not present

## 2019-08-11 DIAGNOSIS — R600 Localized edema: Secondary | ICD-10-CM | POA: Insufficient documentation

## 2019-08-11 LAB — CBC WITH DIFFERENTIAL/PLATELET
Abs Immature Granulocytes: 0.02 10*3/uL (ref 0.00–0.07)
Basophils Absolute: 0 10*3/uL (ref 0.0–0.1)
Basophils Relative: 0 %
Eosinophils Absolute: 0.2 10*3/uL (ref 0.0–0.5)
Eosinophils Relative: 5 %
HCT: 29 % — ABNORMAL LOW (ref 36.0–46.0)
Hemoglobin: 8.5 g/dL — ABNORMAL LOW (ref 12.0–15.0)
Immature Granulocytes: 1 %
Lymphocytes Relative: 13 %
Lymphs Abs: 0.4 10*3/uL — ABNORMAL LOW (ref 0.7–4.0)
MCH: 32.9 pg (ref 26.0–34.0)
MCHC: 29.3 g/dL — ABNORMAL LOW (ref 30.0–36.0)
MCV: 112.4 fL — ABNORMAL HIGH (ref 80.0–100.0)
Monocytes Absolute: 0.7 10*3/uL (ref 0.1–1.0)
Monocytes Relative: 19 %
Neutro Abs: 2.2 10*3/uL (ref 1.7–7.7)
Neutrophils Relative %: 62 %
Platelets: 163 10*3/uL (ref 150–400)
RBC: 2.58 MIL/uL — ABNORMAL LOW (ref 3.87–5.11)
RDW: 19.7 % — ABNORMAL HIGH (ref 11.5–15.5)
WBC: 3.5 10*3/uL — ABNORMAL LOW (ref 4.0–10.5)
nRBC: 1.2 % — ABNORMAL HIGH (ref 0.0–0.2)

## 2019-08-11 LAB — COMPREHENSIVE METABOLIC PANEL
ALT: 26 U/L (ref 0–44)
AST: 18 U/L (ref 15–41)
Albumin: 2.9 g/dL — ABNORMAL LOW (ref 3.5–5.0)
Alkaline Phosphatase: 83 U/L (ref 38–126)
Anion gap: 9 (ref 5–15)
BUN: 30 mg/dL — ABNORMAL HIGH (ref 8–23)
CO2: 26 mmol/L (ref 22–32)
Calcium: 8.2 mg/dL — ABNORMAL LOW (ref 8.9–10.3)
Chloride: 106 mmol/L (ref 98–111)
Creatinine, Ser: 1.28 mg/dL — ABNORMAL HIGH (ref 0.44–1.00)
GFR calc Af Amer: 46 mL/min — ABNORMAL LOW (ref >60)
GFR calc non Af Amer: 40 mL/min — ABNORMAL LOW (ref >60)
Glucose, Bld: 98 mg/dL (ref 70–99)
Potassium: 3.8 mmol/L (ref 3.5–5.1)
Sodium: 141 mmol/L (ref 135–145)
Total Bilirubin: 1.1 mg/dL (ref 0.3–1.2)
Total Protein: 5.6 g/dL — ABNORMAL LOW (ref 6.5–8.1)

## 2019-08-11 MED ORDER — MONTELUKAST SODIUM 10 MG PO TABS: 10.0000 mg | ORAL_TABLET | Freq: Once | ORAL | Status: DC

## 2019-08-11 MED ORDER — DEXAMETHASONE 4 MG PO TABS
10.0000 mg | ORAL_TABLET | Freq: Once | ORAL | Status: AC
  Administered 2019-08-11: 10:00:00 10 mg via ORAL
  Filled 2019-08-11: qty 3

## 2019-08-11 MED ORDER — ACETAMINOPHEN 325 MG PO TABS
650.0000 mg | ORAL_TABLET | Freq: Once | ORAL | Status: AC
  Administered 2019-08-11: 10:00:00 650 mg via ORAL
  Filled 2019-08-11: qty 2

## 2019-08-11 MED ORDER — SODIUM CHLORIDE 0.9 % IV SOLN
510.0000 mg | Freq: Once | INTRAVENOUS | Status: AC
  Administered 2019-08-11: 510 mg via INTRAVENOUS
  Filled 2019-08-11: qty 510

## 2019-08-11 MED ORDER — DARATUMUMAB-HYALURONIDASE-FIHJ 1800-30000 MG-UT/15ML ~~LOC~~ SOLN
1800.0000 mg | Freq: Once | SUBCUTANEOUS | Status: AC
  Administered 2019-08-11: 11:00:00 1800 mg via SUBCUTANEOUS
  Filled 2019-08-11: qty 15

## 2019-08-11 MED ORDER — DIPHENHYDRAMINE HCL 25 MG PO CAPS
50.0000 mg | ORAL_CAPSULE | Freq: Once | ORAL | Status: AC
  Administered 2019-08-11: 50 mg via ORAL
  Filled 2019-08-11: qty 2

## 2019-08-11 NOTE — Progress Notes (Signed)
Patient has been assessed, vital signs and labs have been reviewed by Dr. Katragadda. ANC, Creatinine, LFTs, and Platelets are within treatment parameters per Dr. Katragadda. The patient is good to proceed with treatment at this time.  

## 2019-08-11 NOTE — Progress Notes (Signed)
0900- Patient seen by Dr. Delton Coombes and all lab results reviewed. Per MD, ok to proceed with treatment today. Pt will also received second dose of Feraheme today and come back later in the week for Aranesp injection. Pt aware and verbalizes understanding.   Carol Bradley tolerated Daratumumab SQ and IV Feraheme without incident or complaint. VSS prior to and after treatment. Pt observed post injection per MD orders. Discharged in satisfactory condition.

## 2019-08-11 NOTE — Assessment & Plan Note (Signed)
1.  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 -Revlimid and dexamethasone from February 2017 through 05/2016, achieving negative SPEP and immunofixation. - Bone marrow biopsy on 11/14/2016 shows less than 2% plasma cells, started maintenance Velcade on 11/2016 through 07/2017, stopped due to diarrhea. -Ninlaro, Pomalyst and dexamethasone from 08/24/2017 through December 2020 with progression.  -Labs from [facility] on 06/23/2019 shows M spike of 1.12 g.  Kappa light chains 629 with lambda light chains 8.8 and ratio 70.9.  -Daratumumab, pomalidomide and dexamethasone started on 07/28/2019. -She is tolerating subcutaneous preparation of daratumumab reasonably well. -He is taking pomalidomide without any major problems.  Denies any GI symptoms. -I have reviewed labs which are adequate to proceed with treatment today.  I will see her back in 2 weeks for follow-up.  2.  Left orbit pain: -MRI of the orbits on 03/21/2019 was negative. -She does not have any pain at this time.  3.  Bisphosphonate therapy: -She will continue Zometa 3 mg every 3 months.  4.  Normocytic anemia: -She received Feraheme infusion last week. -Hemoglobin today is 8.5.  She will receive Feraheme second infusion today and Aranesp later this week.  5.  CKD: -Creatinine improved to 1.28.  Baseline is between 1.2-1.5.

## 2019-08-11 NOTE — Patient Instructions (Addendum)
[place] at [facility] Discharge Instructions  You were seen today by Dr. Delton Coombes.  He talked with you about your labs and how you have been feeling lately.  We will see you back in 1 weeks for your next treatment, and Dr. Delton Coombes will see you in 2 weeks.     Thank you for choosing [place] at [facility] to provide your oncology and hematology care.  To afford each patient quality time with our provider, please arrive at least 15 minutes before your scheduled appointment time.   If you have a lab appointment with the [facility] please come in thru the Main Entrance and check in at the main information desk.  You need to re-schedule your appointment should you arrive 10 or more minutes late.  We strive to give you quality time with our providers, and arriving late affects you and other patients whose appointments are after yours.  Also, if you no show three or more times for appointments you may be dismissed from the clinic at the providers discretion.     Again, thank you for choosing [facility].  Our hope is that these requests will decrease the amount of time that you wait before being seen by our physicians.       _____________________________________________________________  Should you have questions after your visit to [facility], please contact our office at [phone] between the hours of 8:00 a.m. and 4:30 p.m.  Voicemails left after 4:00 p.m. will not be returned until the following business day.  For prescription refill requests, have your pharmacy contact our office and allow 72 hours.    Due to Covid, you will need to wear a mask upon entering the hospital. If you do not have a mask, a mask will be given to you at the Main Entrance upon arrival. For doctor visits, patients may have 1 support person with them. For treatment visits, patients can not have anyone with them due to social distancing  guidelines and our immunocompromised population.

## 2019-08-11 NOTE — Progress Notes (Signed)
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    03/18/2015 - 07/28/2015 Chemotherapy   Velcade 1.6 mg/m2 discontinued secondary to intolerance   07/28/2015 Adverse Reaction   stool incontinence, weakness, collapse upon standing, felt to be secondary to velcade   11/09/2015 - 12/13/2015 Chemotherapy   cytoxan IV 300 mg/m2 administered X 2 doses only in addition to rev/dex   11/09/2015 - 06/08/2016 Chemotherapy   Revlimid/Dexamethasone    02/05/2017 - 07/09/2017 Chemotherapy   The patient had bortezomib SQ (VELCADE) chemo injection 1.75 mg, 1 mg/m2 = 1.75 mg (66.7 % of original dose 1.5 mg/m2), Subcutaneous,  Once, 1 of 8 cycles Dose modification: 1.5 mg/m2 (original dose 1.5 mg/m2, Cycle 1, Reason: Provider Judgment, Comment: Per Dr. Norma Fredrickson recommendations from [facility] 1 mg/m2 (original dose 1.5 mg/m2, Cycle 1, Reason: Provider Judgment)  for chemotherapy treatment.     07/09/2017 Adverse Reaction   Diarrhea    Chemotherapy   Pomalyst 2 mg (Days 1-21 every 28 days), Ixazomib 2.3 mg (days 1, 8, 15 every 28 days), and Dexamethasone 10 mg (weekly)- Rx's printed on 08/24/2017.  Treatment recommendations from [facility]ricks[facility] at [facility].   07/28/2019 -  Chemotherapy   The patient had daratumumab-hyaluronidase-fihj (DARZALEX FASPRO) 1800-30000 MG-UT/15ML chemo SQ injection 1,800 mg, 1,800 mg, Subcutaneous,  Once, 1 of 10 cycles Administration: 1,800 mg (07/28/2019), 1,800 mg (08/04/2019), 1,800 mg (08/11/2019)  for chemotherapy treatment.       CANCER  STAGING: Cancer Staging No matching staging information was found for the patient.   INTERVAL HISTORY:  Carol Bradley 79 y.o. female seen for follow-up of multiple myeloma and toxicity assessment prior to next therapy.  Denies any bleeding per rectum or melena.  Felt slightly improved energy levels after Feraheme infusion last week.  Appetite is 75%.  Energy levels are 25%.  Shortness of breath which is chronic from COPD is stable.  REVIEW OF SYSTEMS:  Review of Systems  Respiratory: Positive for shortness of breath.   Cardiovascular: Positive for leg swelling.  Neurological: Positive for numbness.  All other systems reviewed and are negative.    PAST MEDICAL/SURGICAL HISTORY:  Past Medical History:  Diagnosis Date  . 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 CAD (coronary artery disease)   . COPD (chronic obstructive pulmonary disease) ([place])   . History of tobacco abuse   . Hyperlipidemia   . Hypertension   . 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 Multiple myeloma ([place])   . Vitamin B12 deficiency 04/15/2016   Overview:  Vitamin B12 level documented 155, January 2016 with the normal range being 211-924   Past Surgical History:  Procedure Laterality Date  . BACK SURGERY    . CARDIAC CATHETERIZATION  04/2011   right and left cath showing normal right heart pressures,but newly diagnosed coronary artery disease/drug eluting stent placed to RCA with residual disease in the proximal RCA and LAD and ramus, normal LV function and 60-65% EF  . COLONOSCOPY N/A 09/30/2014   Procedure: COLONOSCOPY;  Surgeon: Rogene Houston, MD;  [facility]  [facility];  Service: Endoscopy;  Laterality: N/A;  225  . CORONARY ANGIOPLASTY WITH STENT PLACEMENT  04/2011   mid RCA: 3.0 X38 mm Promus DES. Residual 40% disease proximally  . NECK SURGERY       SOCIAL HISTORY:  Social History   Socioeconomic History  . Marital status: Widowed    Spouse name: Not on file  . Number of  children: Not on file  . Years of education: Not on file  . Highest education level: Not on file  Occupational History  . 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 Smoking status: Former Smoker    Packs/day: 3.00    Years: 25.00    Pack years: 75.00    Types: Cigarettes    Quit date: 07/10/1994    Years since quitting: 25.1  . Smokeless tobacco: Never Used  Substance and Sexual Activity  . Alcohol use: No  . Drug use: Never  . Sexual activity: Not on file  Other Topics Concern  . Not on file  Social History Narrative  . Not on file   Social Determinants of Health   Financial Resource Strain:   . Difficulty of Paying Living Expenses: Not on file  Food Insecurity:   . Worried About Charity fundraiser in the Last Year: Not on file  . Ran Out of Food in the Last Year: Not on file  Transportation Needs:   . Lack of Transportation (Medical): Not on file  . Lack of Transportation (Non-Medical): Not on file  Physical Activity:   . Days of Exercise per Week: Not on file  . Minutes of Exercise per Session: Not on file  Stress:   . Feeling of Stress : Not on file  Social Connections:   . Frequency of Communication with Friends and Family: Not on file  . Frequency of Social Gatherings with Friends and Family: Not on file  . Attends Religious Services: Not on file  . Active Member of Clubs or Organizations: Not on file  . Attends Archivist Meetings: Not on file  . Marital Status: Not on file  Intimate Partner Violence:   . Fear of Current or Ex-Partner: Not on file  . Emotionally Abused: Not on file  . Physically Abused: Not on file  . Sexually Abused: Not on file    FAMILY HISTORY:  Family History  Problem Relation Age of Onset  . Heart failure Mother   . Cancer Father     CURRENT MEDICATIONS:  Outpatient Encounter Medications as of 08/11/2019  Medication Sig Note  . acyclovir (ZOVIRAX) 200 MG capsule TAKE ONE CAPSULE BY MOUTH  TWICE DAILY   . aspirin EC 81 MG tablet Take 1 tablet (81 mg total) by mouth daily.   Marland Kitchen atorvastatin (LIPITOR) 10 MG tablet TAKE ONE TABLET BY MOUTH DAILY   . Calcium Carbonate-Vit D-Min (CALCIUM 600+D PLUS MINERALS) 600-400 MG-UNIT CHEW Chew 1 tablet by mouth 3 (three) times daily.    . cyanocobalamin (,VITAMIN B-12,) 1000 MCG/ML injection Inject 1,000 mcg as directed every 30 (thirty) days.   . daratumumab-hyaluronidase-fihj (DARZALEX FASPRO) 1800-30000 MG-UT/15ML SOLN Inject 1,800 mg into the skin. Weekly weeks 1-8; every 2 weeks weeks 9-24; every 4 weeks after week 24   . dexamethasone (DECADRON) 2 MG tablet Take five tablets ('10mg'$ ) by mouth weekly. (Patient taking differently: Take five tablets ('10mg'$ ) by mouth bi-weekly.) 12/26/2018: [facility] instructed patient to take medication weekly  . diltiazem (CARDIZEM  CD) 240 MG 24 hr capsule Take 1 capsule (240 mg total) by mouth daily.   . magnesium oxide (MAG-OX) 400 (241.3 Mg) MG tablet Take 1 tablet by mouth 2 (two) times daily.   . metoprolol tartrate (LOPRESSOR) 25 MG tablet Take 1.5 tablets (37.5 mg total) by mouth 2 (two) times daily.   . montelukast (SINGULAIR) 10 MG tablet Take 1 tablet three days prior to your first treatment and the day of treatment. Then take one tablet on the day of treatment there after.   . pantoprazole (PROTONIX) 40 MG tablet Take 40 mg by mouth as needed.    . pomalidomide (POMALYST) 2 MG capsule Take 1 capsule (2 mg total) by mouth daily. Take with water on days 1-21. Repeat every 28 days.   . potassium chloride (K-DUR) 10 MEQ tablet TAKE ONE TABLET BY MOUTH THREE TIMES DAILY.   Marland Kitchen umeclidinium-vilanterol (ANORO ELLIPTA) 62.5-25 MCG/INH AEPB Inhale 1 puff into the lungs daily.   . Zoledronic Acid 4 MG SOLR Inject into the vein.   Marland Kitchen albuterol (VENTOLIN HFA) 108 (90 Base) MCG/ACT inhaler Inhale 2 puffs into the lungs every 6 (six) hours as needed for wheezing or shortness of breath. (Patient not taking: Reported on 07/28/2019)    . diazepam (VALIUM) 5 MG tablet TAKE ONE TABLET BY MOUTH EVERY 6 HOURS AS NEEDED FOR ANXIETY (Patient not taking: Reported on 07/28/2019)   . fluticasone (FLONASE) 50 MCG/ACT nasal spray Place 2 sprays into both nostrils as needed.    . furosemide (LASIX) 20 MG tablet Take 1 tablet (20 mg total) by mouth daily as needed (as needed for ankle swelling). (Patient not taking: Reported on 07/28/2019)   . guaiFENesin (MUCINEX) 600 MG 12 hr tablet Take 600 mg 2 (two) times daily as needed by mouth for cough or to loosen phlegm.   . ondansetron (ZOFRAN) 8 MG tablet Take 1 tablet (8 mg total) by mouth every 8 (eight) hours as needed for nausea or vomiting. (Patient not taking: Reported on 07/28/2019)   . polyethylene glycol powder (GLYCOLAX/MIRALAX) 17 GM/SCOOP powder Take by mouth as needed.     No facility-administered encounter medications on file as of 08/11/2019.    ALLERGIES:  Allergies  Allergen Reactions  . Lisinopril Cough  . Plavix [Clopidogrel Bisulfate] Swelling     PHYSICAL EXAM:  ECOG Performance status: 1  Vitals:   08/11/19 0830  BP: (!) 107/52  Pulse: 78  Resp: 18  Temp: (!) 97.1 F (36.2 C)  SpO2: 97%   Filed Weights   08/11/19 0830  Weight: 174 lb 12.8 oz (79.3 kg)    Physical Exam Vitals reviewed.  Constitutional:      Appearance: Normal appearance.  Cardiovascular:     Rate and Rhythm: Normal rate and regular rhythm.     Heart sounds: Normal heart sounds.  Pulmonary:     Effort: Pulmonary effort is normal.     Breath sounds: Normal breath sounds.  Abdominal:     General: There is no distension.     Palpations: Abdomen is soft. There is no mass.  Musculoskeletal:        General: No swelling.  Skin:    General: Skin is warm.  Neurological:     General: No focal deficit present.     Mental Status: She is alert and oriented to person, place, and time.  Psychiatric:        Mood and Affect: Mood normal.        Behavior:  Behavior normal.      LABORATORY  DATA:  I have reviewed the labs as listed.  CBC    Component Value Date/Time   WBC 3.5 (L) 08/11/2019 0808   RBC 2.58 (L) 08/11/2019 0808   HGB 8.5 (L) 08/11/2019 0808   HCT 29.0 (L) 08/11/2019 0808   PLT 163 08/11/2019 0808   MCV 112.4 (H) 08/11/2019 0808   MCH 32.9 08/11/2019 0808   MCHC 29.3 (L) 08/11/2019 0808   RDW 19.7 (H) 08/11/2019 0808   LYMPHSABS 0.4 (L) 08/11/2019 0808   MONOABS 0.7 08/11/2019 0808   EOSABS 0.2 08/11/2019 0808   BASOSABS 0.0 08/11/2019 0808   CMP Latest Ref Rng & Units 08/11/2019 08/04/2019 07/28/2019  Glucose 70 - 99 mg/dL 98 103(H) 98  BUN 8 - 23 mg/dL 30(H) 25(H) 28(H)  Creatinine 0.44 - 1.00 mg/dL 1.28(H) 1.45(H) 1.27(H)  Sodium 135 - 145 mmol/L 141 139 142  Potassium 3.5 - 5.1 mmol/L 3.8 3.5 4.0  Chloride 98 - 111 mmol/L 106 109 111  CO2 22 - 32 mmol/L 26 21(L) 24  Calcium 8.9 - 10.3 mg/dL 8.2(L) 8.0(L) 8.5(L)  Total Protein 6.5 - 8.1 g/dL 5.6(L) 6.0(L) 7.0  Total Bilirubin 0.3 - 1.2 mg/dL 1.1 0.9 0.6  Alkaline Phos 38 - 126 U/L 83 70 77  AST 15 - 41 U/L 18 24 35  ALT 0 - 44 U/L '26 29 31       '$ DIAGNOSTIC IMAGING:  I have independently reviewed the scans and discussed with the patient.   I have reviewed Venita Lick LPN's note and agree with the documentation.  I personally performed a face-to-face visit, made revisions and my assessment and plan is as follows.    ASSESSMENT & PLAN:   Multiple myeloma ([place]) 1.  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 -Revlimid and dexamethasone from February 2017 through 05/2016, achieving negative SPEP and immunofixation. - Bone marrow biopsy on 11/14/2016 shows less than 2% plasma cells, started maintenance Velcade on 11/2016 through 07/2017, stopped due to diarrhea. -N[place]o, Pomalyst and dexamethasone from 08/24/2017 through  December 2020 with progression.  -Labs from [facility] on 06/23/2019 shows M spike of 1.12 g.  Kappa light chains 629 with lambda light chains 8.8 and ratio 70.9.  -Daratumumab, pomalidomide and dexamethasone started on 07/28/2019. -She is tolerating subcutaneous preparation of daratumumab reasonably well. -He is taking pomalidomide without any major problems.  Denies any GI symptoms. -I have reviewed labs which are adequate to proceed with treatment today.  I will see her back in 2 weeks for follow-up.  2.  Left orbit pain: -MRI of the orbits on 03/21/2019 was negative. -She does not have any pain at this time.  3.  Bisphosphonate therapy: -She will continue Zometa 3 mg every 3 months.  4.  Normocytic anemia: -She received Feraheme infusion last week. -Hemoglobin today is 8.5.  She will receive Feraheme second infusion today and Aranesp later this week.  5.  CKD: -Creatinine improved to 1.28.  Baseline is between 1.2-1.5.  Total time spent is 30 minutes with more than 50% of the time spent face-to-face discussing new treatment plan, counseling and coordination of care.    Orders placed this encounter:  No orders of the defined types were placed in this encounter.     Derek Jack, MD [place] [phone]

## 2019-08-11 NOTE — Patient Instructions (Signed)
[facility] Discharge Instructions for Patients Receiving Chemotherapy   Beginning January 23rd 2017 lab work for the Cancer Center will be done in the  Main lab at [facility] on 1st floor. If you have a lab appointment with the Cancer Center please come in thru the  Main Entrance and check in at the main information desk   Today you received the following chemotherapy agents Daratumumab  To help prevent nausea and vomiting after your treatment, we encourage you to take your nausea medication   If you develop nausea and vomiting, or diarrhea that is not controlled by your medication, call the clinic.  The clinic phone number is [phone]. Office hours are Monday-Friday 8:30am-5:00pm.  BELOW ARE SYMPTOMS THAT SHOULD BE REPORTED IMMEDIATELY:  *FEVER GREATER THAN 101.0 F  *CHILLS WITH OR WITHOUT FEVER  NAUSEA AND VOMITING THAT IS NOT CONTROLLED WITH YOUR NAUSEA MEDICATION  *UNUSUAL SHORTNESS OF BREATH  *UNUSUAL BRUISING OR BLEEDING  TENDERNESS IN MOUTH AND THROAT WITH OR WITHOUT PRESENCE OF ULCERS  *URINARY PROBLEMS  *BOWEL PROBLEMS  UNUSUAL RASH Items with * indicate a potential emergency and should be followed up as soon as possible. If you have an emergency after office hours please contact your primary care physician or go to the nearest emergency department.  Please call the clinic during office hours if you have any questions or concerns.   You may also contact the Patient Navigator at [phone] should you have any questions or need assistance in obtaining follow up care.      Resources For Cancer Patients and their Caregivers ? American Cancer Society: Can assist with transportation, wigs, general needs, runs Look Good Feel Better.        1-888-227-6333 ? Cancer Care: Provides financial assistance, online support groups, medication/co-pay assistance.  1-800-813-HOPE (4673) ? [facility] Assists [place]  cancer patients and their families through emotional , educational and financial support.  336-427-4357 ? [place] DSS Where to apply for food stamps, Medicaid and utility assistance. 336-342-1394 ? [facility]: Transportation to medical appointments. 336-347-2287 ? Social Security Administration: May apply for disability if have a Stage IV cancer. 336-342-7796 1-800-772-1213 ? [place] Aging, Disability and Transit Services: Assists with nutrition, care and transit needs. 336-349-2343          

## 2019-08-14 ENCOUNTER — Inpatient Hospital Stay (HOSPITAL_COMMUNITY): Payer: Medicare Other

## 2019-08-14 ENCOUNTER — Other Ambulatory Visit: Payer: Self-pay

## 2019-08-14 ENCOUNTER — Encounter (HOSPITAL_COMMUNITY): Payer: Self-pay

## 2019-08-14 VITALS — BP 130/56 | HR 90 | Temp 97.1°F | Resp 18

## 2019-08-14 DIAGNOSIS — Z5112 Encounter for antineoplastic immunotherapy: Secondary | ICD-10-CM | POA: Diagnosis not present

## 2019-08-14 DIAGNOSIS — D631 Anemia in chronic kidney disease: Secondary | ICD-10-CM

## 2019-08-14 DIAGNOSIS — E538 Deficiency of other specified B group vitamins: Secondary | ICD-10-CM

## 2019-08-14 DIAGNOSIS — C9000 Multiple myeloma not having achieved remission: Secondary | ICD-10-CM

## 2019-08-14 DIAGNOSIS — D801 Nonfamilial hypogammaglobulinemia: Secondary | ICD-10-CM

## 2019-08-14 DIAGNOSIS — N183 Chronic kidney disease, stage 3 unspecified: Secondary | ICD-10-CM

## 2019-08-14 DIAGNOSIS — D509 Iron deficiency anemia, unspecified: Secondary | ICD-10-CM

## 2019-08-14 MED ORDER — DARBEPOETIN ALFA 300 MCG/0.6ML IJ SOSY
300.0000 ug | PREFILLED_SYRINGE | Freq: Once | INTRAMUSCULAR | Status: AC
  Administered 2019-08-14: 16:00:00 300 ug via SUBCUTANEOUS
  Filled 2019-08-14: qty 0.6

## 2019-08-14 NOTE — Progress Notes (Signed)
Patient tolerated injection with no complaints voiced.  Site clean and dry with no bruising or swelling noted at site.  Band aid applied.  Vss with discharge and left ambulatory with no s/s of distress noted.  

## 2019-08-18 ENCOUNTER — Ambulatory Visit (HOSPITAL_COMMUNITY): Payer: Medicare Other | Admitting: Hematology

## 2019-08-18 ENCOUNTER — Other Ambulatory Visit: Payer: Self-pay

## 2019-08-18 ENCOUNTER — Inpatient Hospital Stay (HOSPITAL_COMMUNITY): Payer: Medicare Other

## 2019-08-18 ENCOUNTER — Encounter (HOSPITAL_COMMUNITY): Payer: Self-pay

## 2019-08-18 ENCOUNTER — Other Ambulatory Visit (HOSPITAL_COMMUNITY): Payer: Medicare Other

## 2019-08-18 DIAGNOSIS — Z5112 Encounter for antineoplastic immunotherapy: Secondary | ICD-10-CM | POA: Diagnosis not present

## 2019-08-18 DIAGNOSIS — C9000 Multiple myeloma not having achieved remission: Secondary | ICD-10-CM

## 2019-08-18 LAB — COMPREHENSIVE METABOLIC PANEL
ALT: 25 U/L (ref 0–44)
AST: 17 U/L (ref 15–41)
Albumin: 2.9 g/dL — ABNORMAL LOW (ref 3.5–5.0)
Alkaline Phosphatase: 70 U/L (ref 38–126)
Anion gap: 9 (ref 5–15)
BUN: 32 mg/dL — ABNORMAL HIGH (ref 8–23)
CO2: 25 mmol/L (ref 22–32)
Calcium: 7.5 mg/dL — ABNORMAL LOW (ref 8.9–10.3)
Chloride: 105 mmol/L (ref 98–111)
Creatinine, Ser: 1.27 mg/dL — ABNORMAL HIGH (ref 0.44–1.00)
GFR calc Af Amer: 47 mL/min — ABNORMAL LOW (ref >60)
GFR calc non Af Amer: 40 mL/min — ABNORMAL LOW (ref >60)
Glucose, Bld: 101 mg/dL — ABNORMAL HIGH (ref 70–99)
Potassium: 3.5 mmol/L (ref 3.5–5.1)
Sodium: 139 mmol/L (ref 135–145)
Total Bilirubin: 1.3 mg/dL — ABNORMAL HIGH (ref 0.3–1.2)
Total Protein: 5.4 g/dL — ABNORMAL LOW (ref 6.5–8.1)

## 2019-08-18 LAB — CBC WITH DIFFERENTIAL/PLATELET
Abs Immature Granulocytes: 0.02 10*3/uL (ref 0.00–0.07)
Basophils Absolute: 0 10*3/uL (ref 0.0–0.1)
Basophils Relative: 0 %
Eosinophils Absolute: 0.1 10*3/uL (ref 0.0–0.5)
Eosinophils Relative: 4 %
HCT: 29.3 % — ABNORMAL LOW (ref 36.0–46.0)
Hemoglobin: 9 g/dL — ABNORMAL LOW (ref 12.0–15.0)
Immature Granulocytes: 1 %
Lymphocytes Relative: 19 %
Lymphs Abs: 0.3 10*3/uL — ABNORMAL LOW (ref 0.7–4.0)
MCH: 35.2 pg — ABNORMAL HIGH (ref 26.0–34.0)
MCHC: 30.7 g/dL (ref 30.0–36.0)
MCV: 114.5 fL — ABNORMAL HIGH (ref 80.0–100.0)
Monocytes Absolute: 0.7 10*3/uL (ref 0.1–1.0)
Monocytes Relative: 46 %
Neutro Abs: 0.5 10*3/uL — ABNORMAL LOW (ref 1.7–7.7)
Neutrophils Relative %: 30 %
Platelets: 173 10*3/uL (ref 150–400)
RBC: 2.56 MIL/uL — ABNORMAL LOW (ref 3.87–5.11)
RDW: 21.3 % — ABNORMAL HIGH (ref 11.5–15.5)
WBC: 1.6 10*3/uL — ABNORMAL LOW (ref 4.0–10.5)
nRBC: 2.5 % — ABNORMAL HIGH (ref 0.0–0.2)

## 2019-08-18 NOTE — Progress Notes (Signed)
ANC 0.5 today.  Dr. Delton Coombes made aware.   Patient instructed to hold Pomalyst and no Darzalex today verbal order Dr. Delton Coombes.  Reviewed low white blood cell counts and precautions with the patient with understanding verbalized.  Patient to return next week.

## 2019-08-25 ENCOUNTER — Other Ambulatory Visit: Payer: Self-pay

## 2019-08-25 ENCOUNTER — Inpatient Hospital Stay (HOSPITAL_COMMUNITY): Payer: Medicare Other

## 2019-08-25 ENCOUNTER — Encounter (HOSPITAL_COMMUNITY): Payer: Self-pay | Admitting: Hematology

## 2019-08-25 ENCOUNTER — Inpatient Hospital Stay (HOSPITAL_BASED_OUTPATIENT_CLINIC_OR_DEPARTMENT_OTHER): Payer: Medicare Other | Admitting: Hematology

## 2019-08-25 VITALS — HR 98

## 2019-08-25 DIAGNOSIS — D801 Nonfamilial hypogammaglobulinemia: Secondary | ICD-10-CM

## 2019-08-25 DIAGNOSIS — C9000 Multiple myeloma not having achieved remission: Secondary | ICD-10-CM

## 2019-08-25 DIAGNOSIS — Z5112 Encounter for antineoplastic immunotherapy: Secondary | ICD-10-CM | POA: Diagnosis not present

## 2019-08-25 DIAGNOSIS — D631 Anemia in chronic kidney disease: Secondary | ICD-10-CM

## 2019-08-25 DIAGNOSIS — D509 Iron deficiency anemia, unspecified: Secondary | ICD-10-CM

## 2019-08-25 DIAGNOSIS — E538 Deficiency of other specified B group vitamins: Secondary | ICD-10-CM

## 2019-08-25 DIAGNOSIS — N183 Chronic kidney disease, stage 3 unspecified: Secondary | ICD-10-CM

## 2019-08-25 LAB — CBC WITH DIFFERENTIAL/PLATELET
Abs Immature Granulocytes: 0.04 10*3/uL (ref 0.00–0.07)
Basophils Absolute: 0 10*3/uL (ref 0.0–0.1)
Basophils Relative: 1 %
Eosinophils Absolute: 0 10*3/uL (ref 0.0–0.5)
Eosinophils Relative: 0 %
HCT: 28.8 % — ABNORMAL LOW (ref 36.0–46.0)
Hemoglobin: 8.7 g/dL — ABNORMAL LOW (ref 12.0–15.0)
Immature Granulocytes: 1 %
Lymphocytes Relative: 12 %
Lymphs Abs: 0.5 10*3/uL — ABNORMAL LOW (ref 0.7–4.0)
MCH: 34.7 pg — ABNORMAL HIGH (ref 26.0–34.0)
MCHC: 30.2 g/dL (ref 30.0–36.0)
MCV: 114.7 fL — ABNORMAL HIGH (ref 80.0–100.0)
Monocytes Absolute: 0.9 10*3/uL (ref 0.1–1.0)
Monocytes Relative: 20 %
Neutro Abs: 2.8 10*3/uL (ref 1.7–7.7)
Neutrophils Relative %: 66 %
Platelets: 257 10*3/uL (ref 150–400)
RBC: 2.51 MIL/uL — ABNORMAL LOW (ref 3.87–5.11)
RDW: 21.2 % — ABNORMAL HIGH (ref 11.5–15.5)
WBC: 4.2 10*3/uL (ref 4.0–10.5)
nRBC: 1.4 % — ABNORMAL HIGH (ref 0.0–0.2)

## 2019-08-25 LAB — COMPREHENSIVE METABOLIC PANEL
ALT: 23 U/L (ref 0–44)
AST: 14 U/L — ABNORMAL LOW (ref 15–41)
Albumin: 2.8 g/dL — ABNORMAL LOW (ref 3.5–5.0)
Alkaline Phosphatase: 63 U/L (ref 38–126)
Anion gap: 9 (ref 5–15)
BUN: 22 mg/dL (ref 8–23)
CO2: 24 mmol/L (ref 22–32)
Calcium: 8 mg/dL — ABNORMAL LOW (ref 8.9–10.3)
Chloride: 106 mmol/L (ref 98–111)
Creatinine, Ser: 1.28 mg/dL — ABNORMAL HIGH (ref 0.44–1.00)
GFR calc Af Amer: 46 mL/min — ABNORMAL LOW (ref >60)
GFR calc non Af Amer: 40 mL/min — ABNORMAL LOW (ref >60)
Glucose, Bld: 116 mg/dL — ABNORMAL HIGH (ref 70–99)
Potassium: 3.7 mmol/L (ref 3.5–5.1)
Sodium: 139 mmol/L (ref 135–145)
Total Bilirubin: 0.7 mg/dL (ref 0.3–1.2)
Total Protein: 5.9 g/dL — ABNORMAL LOW (ref 6.5–8.1)

## 2019-08-25 MED ORDER — DIPHENHYDRAMINE HCL 25 MG PO CAPS
50.0000 mg | ORAL_CAPSULE | Freq: Once | ORAL | Status: AC
  Administered 2019-08-25: 10:00:00 50 mg via ORAL
  Filled 2019-08-25: qty 2

## 2019-08-25 MED ORDER — CYANOCOBALAMIN 1000 MCG/ML IJ SOLN
1000.0000 ug | Freq: Once | INTRAMUSCULAR | Status: AC
  Administered 2019-08-25: 10:00:00 1000 ug via INTRAMUSCULAR
  Filled 2019-08-25: qty 1

## 2019-08-25 MED ORDER — ACETAMINOPHEN 325 MG PO TABS
650.0000 mg | ORAL_TABLET | Freq: Once | ORAL | Status: AC
  Administered 2019-08-25: 10:00:00 650 mg via ORAL
  Filled 2019-08-25: qty 2

## 2019-08-25 MED ORDER — DARBEPOETIN ALFA 300 MCG/0.6ML IJ SOSY
300.0000 ug | PREFILLED_SYRINGE | Freq: Once | INTRAMUSCULAR | Status: AC
  Administered 2019-08-25: 300 ug via SUBCUTANEOUS
  Filled 2019-08-25: qty 0.6

## 2019-08-25 MED ORDER — DARATUMUMAB-HYALURONIDASE-FIHJ 1800-30000 MG-UT/15ML ~~LOC~~ SOLN
1800.0000 mg | Freq: Once | SUBCUTANEOUS | Status: AC
  Administered 2019-08-25: 1800 mg via SUBCUTANEOUS
  Filled 2019-08-25: qty 15

## 2019-08-25 MED ORDER — DIPHENHYDRAMINE HCL 25 MG PO CAPS
ORAL_CAPSULE | ORAL | Status: AC
  Filled 2019-08-25: qty 1

## 2019-08-25 MED ORDER — DEXAMETHASONE 4 MG PO TABS
10.0000 mg | ORAL_TABLET | Freq: Once | ORAL | Status: AC
  Administered 2019-08-25: 10 mg via ORAL
  Filled 2019-08-25: qty 3

## 2019-08-25 NOTE — Progress Notes (Signed)
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    03/18/2015 - 07/28/2015 Chemotherapy   Velcade 1.6 mg/m2 discontinued secondary to intolerance   07/28/2015 Adverse Reaction   stool incontinence, weakness, collapse upon standing, felt to be secondary to velcade   11/09/2015 - 12/13/2015 Chemotherapy   cytoxan IV 300 mg/m2 administered X 2 doses only in addition to rev/dex   11/09/2015 - 06/08/2016 Chemotherapy   Revlimid/Dexamethasone    02/05/2017 - 07/09/2017 Chemotherapy   The patient had bortezomib SQ (VELCADE) chemo injection 1.75 mg, 1 mg/m2 = 1.75 mg (66.7 % of original dose 1.5 mg/m2), Subcutaneous,  Once, 1 of 8 cycles Dose modification: 1.5 mg/m2 (original dose 1.5 mg/m2, Cycle 1, Reason: Provider Judgment, Comment: Per Dr. Norma Fredrickson recommendations from [facility] 1 mg/m2 (original dose 1.5 mg/m2, Cycle 1, Reason: Provider Judgment)  for chemotherapy treatment.     07/09/2017 Adverse Reaction   Diarrhea    Chemotherapy   Pomalyst 2 mg (Days 1-21 every 28 days), Ixazomib 2.3 mg (days 1, 8, 15 every 28 days), and Dexamethasone 10 mg (weekly)- Rx's printed on 08/24/2017.  Treatment recommendations from Dr. Norma Fredrickson at [facility].   07/28/2019 -  Chemotherapy   The patient had daratumumab-hyaluronidase-fihj (DARZALEX FASPRO) 1800-30000 MG-UT/15ML chemo SQ injection 1,800 mg, 1,800 mg, Subcutaneous,  Once, 1 of 10 cycles Administration: 1,800 mg (07/28/2019), 1,800 mg (08/04/2019), 1,800 mg (08/11/2019), 1,800 mg (08/25/2019)  for chemotherapy  treatment.       CANCER STAGING: Cancer Staging No matching staging information was found for the patient.   INTERVAL HISTORY:  Ms. Cottrell 79 y.o. female seen for follow-up of multiple myeloma and toxicity assessment.  We held her treatment last week because of neutropenia.  Appetite is 50%.  Energy levels are 25%.  Shortness of breath on exertion is present.  Reports some sore throat and mouth sores.  Numbness in the extremities has been stable.  Denies any fevers or chills.  REVIEW OF SYSTEMS:  Review of Systems  HENT:   Positive for sore throat.   Respiratory: Positive for shortness of breath.   Neurological: Positive for numbness.  All other systems reviewed and are negative.    PAST MEDICAL/SURGICAL HISTORY:  Past Medical History:  Diagnosis Date  . 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 CAD (coronary artery disease)   . COPD (chronic obstructive pulmonary disease) ([place])   . History of tobacco abuse   . Hyperlipidemia   . Hypertension   . 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 Multiple myeloma ([place])   . Vitamin B12 deficiency 04/15/2016   Overview:  Vitamin B12 level documented 155, January 2016 with the normal range being 211-924   Past Surgical History:  Procedure Laterality Date  . BACK SURGERY    . CARDIAC CATHETERIZATION  04/2011   right and left cath showing normal right heart pressures,but newly diagnosed coronary artery disease/drug eluting stent placed to RCA with residual disease in the proximal RCA and LAD and ramus, normal LV function and 60-65% EF  . COLONOSCOPY N/A 09/30/2014  Procedure: COLONOSCOPY;  Surgeon: Rogene Houston, MD;  Location: [facility];  Service: Endoscopy;  Laterality: N/A;  225  . CORONARY ANGIOPLASTY WITH STENT PLACEMENT  04/2011   mid RCA: 3.0 X38 mm Promus DES. Residual 40% disease proximally  . NECK SURGERY       SOCIAL HISTORY:  Social History   Socioeconomic History  . Marital status: Widowed     Spouse name: Not on file  . Number of children: Not on file  . Years of education: Not on file  . Highest education level: Not on file  Occupational History  . 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 Smoking status: Former Smoker    Packs/day: 3.00    Years: 25.00    Pack years: 75.00    Types: Cigarettes    Quit date: 07/10/1994    Years since quitting: 25.1  . Smokeless tobacco: Never Used  Substance and Sexual Activity  . Alcohol use: No  . Drug use: Never  . Sexual activity: Not on file  Other Topics Concern  . Not on file  Social History Narrative  . Not on file   Social Determinants of Health   Financial Resource Strain:   . Difficulty of Paying Living Expenses: Not on file  Food Insecurity:   . Worried About Charity fundraiser in the Last Year: Not on file  . Ran Out of Food in the Last Year: Not on file  Transportation Needs:   . Lack of Transportation (Medical): Not on file  . Lack of Transportation (Non-Medical): Not on file  Physical Activity:   . Days of Exercise per Week: Not on file  . Minutes of Exercise per Session: Not on file  Stress:   . Feeling of Stress : Not on file  Social Connections:   . Frequency of Communication with Friends and Family: Not on file  . Frequency of Social Gatherings with Friends and Family: Not on file  . Attends Religious Services: Not on file  . Active Member of Clubs or Organizations: Not on file  . Attends Archivist Meetings: Not on file  . Marital Status: Not on file  Intimate Partner Violence:   . Fear of Current or Ex-Partner: Not on file  . Emotionally Abused: Not on file  . Physically Abused: Not on file  . Sexually Abused: Not on file    FAMILY HISTORY:  Family History  Problem Relation Age of Onset  . Heart failure Mother   . Cancer Father     CURRENT MEDICATIONS:  Outpatient Encounter Medications as of 08/25/2019  Medication Sig Note  . acyclovir (ZOVIRAX)  200 MG capsule TAKE ONE CAPSULE BY MOUTH TWICE DAILY   . aspirin EC 81 MG tablet Take 1 tablet (81 mg total) by mouth daily.   Marland Kitchen atorvastatin (LIPITOR) 10 MG tablet TAKE ONE TABLET BY MOUTH DAILY   . Calcium Carbonate-Vit D-Min (CALCIUM 600+D PLUS MINERALS) 600-400 MG-UNIT CHEW Chew 1 tablet by mouth 3 (three) times daily.    . cyanocobalamin (,VITAMIN B-12,) 1000 MCG/ML injection Inject 1,000 mcg as directed every 30 (thirty) days.   . daratumumab-hyaluronidase-fihj (DARZALEX FASPRO) 1800-30000 MG-UT/15ML SOLN Inject 1,800 mg into the skin. Weekly weeks 1-8; every 2 weeks weeks 9-24; every 4 weeks after week 24   . dexamethasone (DECADRON) 2 MG tablet Take five tablets ('10mg'$ ) by mouth weekly. (Patient taking differently: Take five tablets ('10mg'$ ) by mouth bi-weekly.) 12/26/2018: Phoebe Perch  instructed patient to take medication weekly  . diltiazem (CARDIZEM CD) 240 MG 24 hr capsule Take 1 capsule (240 mg total) by mouth daily.   . fluticasone (FLONASE) 50 MCG/ACT nasal spray Place 2 sprays into both nostrils as needed.    . furosemide (LASIX) 20 MG tablet Take 1 tablet (20 mg total) by mouth daily as needed (as needed for ankle swelling).   . magnesium oxide (MAG-OX) 400 (241.3 Mg) MG tablet Take 1 tablet by mouth 2 (two) times daily.   . metoprolol tartrate (LOPRESSOR) 25 MG tablet Take 1.5 tablets (37.5 mg total) by mouth 2 (two) times daily.   . montelukast (SINGULAIR) 10 MG tablet Take 1 tablet three days prior to your first treatment and the day of treatment. Then take one tablet on the day of treatment there after.   . pantoprazole (PROTONIX) 40 MG tablet Take 40 mg by mouth as needed.    . pomalidomide (POMALYST) 2 MG capsule Take 1 capsule (2 mg total) by mouth daily. Take with water on days 1-21. Repeat every 28 days.   . potassium chloride (K-DUR) 10 MEQ tablet TAKE ONE TABLET BY MOUTH THREE TIMES DAILY.   Marland Kitchen umeclidinium-vilanterol (ANORO ELLIPTA) 62.5-25 MCG/INH AEPB Inhale 1 puff into the lungs  daily.   . Zoledronic Acid 4 MG SOLR Inject into the vein.   Marland Kitchen albuterol (VENTOLIN HFA) 108 (90 Base) MCG/ACT inhaler Inhale 2 puffs into the lungs every 6 (six) hours as needed for wheezing or shortness of breath. (Patient not taking: Reported on 08/25/2019)   . diazepam (VALIUM) 5 MG tablet TAKE ONE TABLET BY MOUTH EVERY 6 HOURS AS NEEDED FOR ANXIETY (Patient not taking: Reported on 08/25/2019)   . guaiFENesin (MUCINEX) 600 MG 12 hr tablet Take 600 mg 2 (two) times daily as needed by mouth for cough or to loosen phlegm.   . ondansetron (ZOFRAN) 8 MG tablet Take 1 tablet (8 mg total) by mouth every 8 (eight) hours as needed for nausea or vomiting. (Patient not taking: Reported on 08/25/2019)   . polyethylene glycol powder (GLYCOLAX/MIRALAX) 17 GM/SCOOP powder Take by mouth as needed.     No facility-administered encounter medications on file as of 08/25/2019.    ALLERGIES:  Allergies  Allergen Reactions  . Lisinopril Cough  . Plavix [Clopidogrel Bisulfate] Swelling     PHYSICAL EXAM:  ECOG Performance status: 1  Vitals:   08/25/19 0838  BP: 138/65  Pulse: (!) 111  Resp: 19  Temp: (!) 97.1 F (36.2 C)  SpO2: 98%   Filed Weights   08/25/19 0838  Weight: 172 lb 6.4 oz (78.2 kg)    Physical Exam Vitals reviewed.  Constitutional:      Appearance: Normal appearance.  Cardiovascular:     Rate and Rhythm: Normal rate and regular rhythm.     Heart sounds: Normal heart sounds.  Pulmonary:     Effort: Pulmonary effort is normal.     Breath sounds: Normal breath sounds.  Abdominal:     General: There is no distension.     Palpations: Abdomen is soft. There is no mass.  Musculoskeletal:        General: No swelling.  Skin:    General: Skin is warm.  Neurological:     General: No focal deficit present.     Mental Status: She is alert and oriented to person, place, and time.  Psychiatric:        Mood and Affect: Mood normal.  Behavior: Behavior normal.       LABORATORY DATA:  I have reviewed the labs as listed.  CBC    Component Value Date/Time   WBC 4.2 08/25/2019 0817   RBC 2.51 (L) 08/25/2019 0817   HGB 8.7 (L) 08/25/2019 0817   HCT 28.8 (L) 08/25/2019 0817   PLT 257 08/25/2019 0817   MCV 114.7 (H) 08/25/2019 0817   MCH 34.7 (H) 08/25/2019 0817   MCHC 30.2 08/25/2019 0817   RDW 21.2 (H) 08/25/2019 0817   LYMPHSABS 0.5 (L) 08/25/2019 0817   MONOABS 0.9 08/25/2019 0817   EOSABS 0.0 08/25/2019 0817   BASOSABS 0.0 08/25/2019 0817   CMP Latest Ref Rng & Units 08/25/2019 08/18/2019 08/11/2019  Glucose 70 - 99 mg/dL 116(H) 101(H) 98  BUN 8 - 23 mg/dL 22 32(H) 30(H)  Creatinine 0.44 - 1.00 mg/dL 1.28(H) 1.27(H) 1.28(H)  Sodium 135 - 145 mmol/L 139 139 141  Potassium 3.5 - 5.1 mmol/L 3.7 3.5 3.8  Chloride 98 - 111 mmol/L 106 105 106  CO2 22 - 32 mmol/L _0 Calcium 8.9 - 10.3 mg/dL 8.0(L) 7.5(L) 8.2(L)  Total Protein 6.5 - 8.1 g/dL 5.9(L) 5.4(L) 5.6(L)  Total Bilirubin 0.3 - 1.2 mg/dL 0.7 1.3(H) 1.1  Alkaline Phos 38 - 126 U/L 63 70 83  AST 15 - 41 U/L 14(L) 17 18  ALT 0 - 44 U/L _1 DIAGNOSTIC IMAGING:  I have independently reviewed the scans and discussed with the patient.   I have reviewed Venita Lick LPN's note and agree with the documentation.  I personally performed a face-to-face visit, made revisions and my assessment and plan is as follows.    ASSESSMENT & PLAN:   Multiple myeloma ([facility]) 1.  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 -We held her treatment last week because of neutropenia. -I have reviewed blood work today.  White count improved to 4.2.  LFTs are within normal limits. -We will proceed with the day 22 of daratumumab today. -I have told her to not to start Pomalyst until next week with cycle 2. -I will see her back in 2 weeks for follow-up.  2.  Bisphosphonate therapy: -She will continue Zometa  every 3 months.  Last dose was on 07/18/2019. -She will continue calcium and vitamin D supplements.  3.  Normocytic anemia: -She has a hemoglobin between 8 and 9.  This is from myelosuppression from Pomalyst.  She also has CKD contributing to anemia. -She will receive Aranesp today.  She received Feraheme on 08/04/2019 and 08/11/2019.  4.  CKD: -Creatinine today is 1.28.  Baseline is between 1.2-1.5.  5.  Left orbit pain: -MRI of the orbits on 03/21/2019 was negative.  Pain resolved at this time.     Orders placed this encounter:  No orders of the defined types were placed in this encounter.     Derek Jack, MD [place] [phone]

## 2019-08-25 NOTE — Progress Notes (Signed)
Patient has been assessed, vital signs and labs have been reviewed by Dr. Katragadda. ANC, Creatinine, LFTs, and Platelets are within treatment parameters per Dr. Katragadda. The patient is good to proceed with treatment at this time.  

## 2019-08-25 NOTE — Patient Instructions (Addendum)
[place] at [facility] Discharge Instructions  You were seen today by Dr. Delton Coombes. He went over your recent lab results. Continue injections every week. He will see you back in 2 weeks for labs, treatment and follow up.   Thank you for choosing [place] at [facility] to provide your oncology and hematology care.  To afford each patient quality time with our provider, please arrive at least 15 minutes before your scheduled appointment time.   If you have a lab appointment with the [facility] please come in thru the  Main Entrance and check in at the main information desk  You need to re-schedule your appointment should you arrive 10 or more minutes late.  We strive to give you quality time with our providers, and arriving late affects you and other patients whose appointments are after yours.  Also, if you no show three or more times for appointments you may be dismissed from the clinic at the providers discretion.     Again, thank you for choosing [facility]  Our hope is that these requests will decrease the amount of time that you wait before being seen by our physicians.       _____________________________________________________________  Should you have questions after your visit to [facility], please contact our office at [phone] between the hours of 8:00 a.m. and 4:30 p.m.  Voicemails left after 4:00 p.m. will not be returned until the following business day.  For prescription refill requests, have your pharmacy contact our office and allow 72 hours.    Cancer Center Support Programs:   > Cancer Support Group  2nd Tuesday of the month 1pm-2pm, Journey Room

## 2019-08-25 NOTE — Progress Notes (Signed)
Labs reviewed with MD today. Proceed with treatment per MD.   B12 injection, aranesp injection given today per orders.   Treatment given per orders. Patient tolerated it well without problems. Vitals stable and discharged home from clinic via wheelchair. Follow up as scheduled.

## 2019-08-25 NOTE — Assessment & Plan Note (Addendum)
1.  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 -We held her treatment last week because of neutropenia. -I have reviewed blood work today.  White count improved to 4.2.  LFTs are within normal limits. -We will proceed with the day 22 of daratumumab today. -I have told her to not to start Pomalyst until next week with cycle 2. -I will see her back in 2 weeks for follow-up.  2.  Bisphosphonate therapy: -She will continue Zometa every 3 months.  Last dose was on 07/18/2019. -She will continue calcium and vitamin D supplements.  3.  Normocytic anemia: -She has a hemoglobin between 8 and 9.  This is from myelosuppression from Pomalyst.  She also has CKD contributing to anemia. -She will receive Aranesp today.  She received Feraheme on 08/04/2019 and 08/11/2019.  4.  CKD: -Creatinine today is 1.28.  Baseline is between 1.2-1.5.  5.  Left orbit pain: -MRI of the orbits on 03/21/2019 was negative.  Pain resolved at this time.

## 2019-08-25 NOTE — Patient Instructions (Signed)
[facility] Discharge Instructions for Patients Receiving Chemotherapy  Today you received the following chemotherapy agents   To help prevent nausea and vomiting after your treatment, we encourage you to take your nausea medication   If you develop nausea and vomiting that is not controlled by your nausea medication, call the clinic.   BELOW ARE SYMPTOMS THAT SHOULD BE REPORTED IMMEDIATELY:  *FEVER GREATER THAN 100.5 F  *CHILLS WITH OR WITHOUT FEVER  NAUSEA AND VOMITING THAT IS NOT CONTROLLED WITH YOUR NAUSEA MEDICATION  *UNUSUAL SHORTNESS OF BREATH  *UNUSUAL BRUISING OR BLEEDING  TENDERNESS IN MOUTH AND THROAT WITH OR WITHOUT PRESENCE OF ULCERS  *URINARY PROBLEMS  *BOWEL PROBLEMS  UNUSUAL RASH Items with * indicate a potential emergency and should be followed up as soon as possible.  Feel free to call the clinic should you have any questions or concerns. The clinic phone number is [phone].  Please show the CHEMO ALERT CARD at check-in to the Emergency Department and triage nurse.   

## 2019-09-01 ENCOUNTER — Ambulatory Visit (HOSPITAL_COMMUNITY): Payer: Medicare Other | Admitting: Hematology

## 2019-09-01 ENCOUNTER — Encounter (HOSPITAL_COMMUNITY): Payer: Self-pay

## 2019-09-01 ENCOUNTER — Other Ambulatory Visit: Payer: Self-pay

## 2019-09-01 ENCOUNTER — Inpatient Hospital Stay (HOSPITAL_BASED_OUTPATIENT_CLINIC_OR_DEPARTMENT_OTHER): Payer: Medicare Other | Admitting: Hematology

## 2019-09-01 ENCOUNTER — Other Ambulatory Visit (HOSPITAL_COMMUNITY): Payer: Medicare Other

## 2019-09-01 ENCOUNTER — Inpatient Hospital Stay (HOSPITAL_COMMUNITY): Payer: Medicare Other

## 2019-09-01 ENCOUNTER — Ambulatory Visit (HOSPITAL_COMMUNITY)
Admission: RE | Admit: 2019-09-01 | Discharge: 2019-09-01 | Disposition: A | Discharge status: Home / Self Care | Payer: Medicare Other | Source: Ambulatory Visit | Attending: Hematology | Admitting: Hematology

## 2019-09-01 VITALS — BP 113/56 | HR 110 | Temp 97.3°F | Resp 18 | Wt 166.2 lb

## 2019-09-01 DIAGNOSIS — R110 Nausea: Secondary | ICD-10-CM

## 2019-09-01 DIAGNOSIS — D631 Anemia in chronic kidney disease: Secondary | ICD-10-CM

## 2019-09-01 DIAGNOSIS — R059 Cough, unspecified: Secondary | ICD-10-CM

## 2019-09-01 DIAGNOSIS — D509 Iron deficiency anemia, unspecified: Secondary | ICD-10-CM

## 2019-09-01 DIAGNOSIS — C9000 Multiple myeloma not having achieved remission: Secondary | ICD-10-CM

## 2019-09-01 DIAGNOSIS — R05 Cough: Secondary | ICD-10-CM | POA: Diagnosis present

## 2019-09-01 DIAGNOSIS — N183 Chronic kidney disease, stage 3 unspecified: Secondary | ICD-10-CM

## 2019-09-01 DIAGNOSIS — E538 Deficiency of other specified B group vitamins: Secondary | ICD-10-CM

## 2019-09-01 LAB — COMPREHENSIVE METABOLIC PANEL
ALT: 16 U/L (ref 0–44)
AST: 13 U/L — ABNORMAL LOW (ref 15–41)
Albumin: 2.6 g/dL — ABNORMAL LOW (ref 3.5–5.0)
Alkaline Phosphatase: 66 U/L (ref 38–126)
Anion gap: 12 (ref 5–15)
BUN: 19 mg/dL (ref 8–23)
CO2: 23 mmol/L (ref 22–32)
Calcium: 8 mg/dL — ABNORMAL LOW (ref 8.9–10.3)
Chloride: 102 mmol/L (ref 98–111)
Creatinine, Ser: 1.4 mg/dL — ABNORMAL HIGH (ref 0.44–1.00)
GFR calc Af Amer: 42 mL/min — ABNORMAL LOW (ref >60)
GFR calc non Af Amer: 36 mL/min — ABNORMAL LOW (ref >60)
Glucose, Bld: 136 mg/dL — ABNORMAL HIGH (ref 70–99)
Potassium: 3.5 mmol/L (ref 3.5–5.1)
Sodium: 137 mmol/L (ref 135–145)
Total Bilirubin: 0.7 mg/dL (ref 0.3–1.2)
Total Protein: 6 g/dL — ABNORMAL LOW (ref 6.5–8.1)

## 2019-09-01 LAB — CBC WITH DIFFERENTIAL/PLATELET
Abs Immature Granulocytes: 0.08 10*3/uL — ABNORMAL HIGH (ref 0.00–0.07)
Basophils Absolute: 0.1 10*3/uL (ref 0.0–0.1)
Basophils Relative: 1 %
Eosinophils Absolute: 0 10*3/uL (ref 0.0–0.5)
Eosinophils Relative: 0 %
HCT: 30 % — ABNORMAL LOW (ref 36.0–46.0)
Hemoglobin: 9.3 g/dL — ABNORMAL LOW (ref 12.0–15.0)
Immature Granulocytes: 1 %
Lymphocytes Relative: 2 %
Lymphs Abs: 0.3 10*3/uL — ABNORMAL LOW (ref 0.7–4.0)
MCH: 33.1 pg (ref 26.0–34.0)
MCHC: 31 g/dL (ref 30.0–36.0)
MCV: 106.8 fL — ABNORMAL HIGH (ref 80.0–100.0)
Monocytes Absolute: 1.9 10*3/uL — ABNORMAL HIGH (ref 0.1–1.0)
Monocytes Relative: 14 %
Neutro Abs: 10.7 10*3/uL — ABNORMAL HIGH (ref 1.7–7.7)
Neutrophils Relative %: 82 %
Platelets: 281 10*3/uL (ref 150–400)
RBC: 2.81 MIL/uL — ABNORMAL LOW (ref 3.87–5.11)
RDW: 19 % — ABNORMAL HIGH (ref 11.5–15.5)
WBC: 13.1 10*3/uL — ABNORMAL HIGH (ref 4.0–10.5)
nRBC: 0 % (ref 0.0–0.2)

## 2019-09-01 MED ORDER — ONDANSETRON HCL 8 MG PO TABS: 8.0000 mg | ORAL_TABLET | Freq: Three times a day (TID) | ORAL | 0 refills | Status: DC | PRN

## 2019-09-01 MED ORDER — SODIUM CHLORIDE 0.9 % IV SOLN
Freq: Once | INTRAVENOUS | Status: AC
  Filled 2019-09-01: qty 4

## 2019-09-01 MED ORDER — SODIUM CHLORIDE 0.9 % IV SOLN
Freq: Once | INTRAVENOUS | Status: AC
  Filled 2019-09-01: qty 1000

## 2019-09-01 MED ORDER — SODIUM CHLORIDE 0.9 % IV SOLN: Freq: Once | INTRAVENOUS | Status: AC

## 2019-09-01 NOTE — Progress Notes (Signed)
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    03/18/2015 - 07/28/2015 Chemotherapy   Velcade 1.6 mg/m2 discontinued secondary to intolerance   07/28/2015 Adverse Reaction   stool incontinence, weakness, collapse upon standing, felt to be secondary to velcade   11/09/2015 - 12/13/2015 Chemotherapy   cytoxan IV 300 mg/m2 administered X 2 doses only in addition to rev/dex   11/09/2015 - 06/08/2016 Chemotherapy   Revlimid/Dexamethasone    02/05/2017 - 07/09/2017 Chemotherapy   The patient had bortezomib SQ (VELCADE) chemo injection 1.75 mg, 1 mg/m2 = 1.75 mg (66.7 % of original dose 1.5 mg/m2), Subcutaneous,  Once, 1 of 8 cycles Dose modification: 1.5 mg/m2 (original dose 1.5 mg/m2, Cycle 1, Reason: Provider Judgment, Comment: Per Dr. Norma Fredrickson recommendations from [facility] 1 mg/m2 (original dose 1.5 mg/m2, Cycle 1, Reason: Provider Judgment)  for chemotherapy treatment.     07/09/2017 Adverse Reaction   Diarrhea    Chemotherapy   Pomalyst 2 mg (Days 1-21 every 28 days), Ixazomib 2.3 mg (days 1, 8, 15 every 28 days), and Dexamethasone 10 mg (weekly)- Rx's printed on 08/24/2017.  Treatment recommendations from Dr. Norma Fredrickson at [facility].   07/28/2019 -  Chemotherapy   The patient had daratumumab-hyaluronidase-fihj (DARZALEX FASPRO) 1800-30000 MG-UT/15ML chemo SQ injection 1,800 mg, 1,800 mg, Subcutaneous,  Once, 1 of 10 cycles Administration: 1,800 mg (07/28/2019), 1,800 mg (08/04/2019), 1,800 mg (08/11/2019), 1,800 mg (08/25/2019)  for chemotherapy  treatment.       CANCER STAGING: Cancer Staging No matching staging information was found for the patient.   INTERVAL HISTORY:  Carol Bradley 79 y.o. female seen for follow-up of multiple myeloma on an unscheduled visit.  She reportedly developed postnasal drip last week.  She was evaluated by PMD and was prescribed Augmentin twice daily last Tuesday.  She did not feel well the whole of last week.  She felt nauseous and very weak.  She could not even move in her house.  She felt dyspnea on exertion.  She has taken into ensures the whole of last week.  She did not eat by mouth well.  She also missed dexamethasone last Thursday.  She usually takes 5 pills on each Thursday.  Denies any fevers or chills.  Sore throat improved after starting antibiotic.  REVIEW OF SYSTEMS:  Review of Systems  Constitutional: Positive for fatigue.  Respiratory: Positive for shortness of breath.   Gastrointestinal: Positive for nausea.  Neurological: Positive for dizziness.  All other systems reviewed and are negative.    PAST MEDICAL/SURGICAL HISTORY:  Past Medical History:  Diagnosis Date  . 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 CAD (coronary artery disease)   . COPD (chronic obstructive pulmonary disease) ([place])   . History of tobacco abuse   . Hyperlipidemia   . Hypertension   . 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 Multiple myeloma ([place])   . Vitamin B12 deficiency 04/15/2016   Overview:  Vitamin B12 level documented 155, January 2016 with the normal range being 211-924   Past Surgical History:  Procedure Laterality  Date  . BACK SURGERY    . CARDIAC CATHETERIZATION  04/2011   right and left cath showing normal right heart pressures,but newly diagnosed coronary artery disease/drug eluting stent placed to RCA with residual disease in the proximal RCA and LAD and ramus, normal LV function and 60-65% EF  . COLONOSCOPY N/A 09/30/2014   Procedure: COLONOSCOPY;  Surgeon: Rogene Houston, MD;  Location: [facility];  Service: Endoscopy;  Laterality: N/A;  225  . CORONARY ANGIOPLASTY WITH STENT PLACEMENT  04/2011   mid RCA: 3.0 X38 mm Promus DES. Residual 40% disease proximally  . NECK SURGERY       SOCIAL HISTORY:  Social History   Socioeconomic History  . Marital status: Widowed    Spouse name: Not on file  . Number of children: Not on file  . Years of education: Not on file  . Highest education level: Not on file  Occupational History  . 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 Smoking status: Former Smoker    Packs/day: 3.00    Years: 25.00    Pack years: 75.00    Types: Cigarettes    Quit date: 07/10/1994    Years since quitting: 25.1  . Smokeless tobacco: Never Used  Substance and Sexual Activity  . Alcohol use: No  . Drug use: Never  . Sexual activity: Not on file  Other Topics Concern  . Not on file  Social History Narrative  . Not on file   Social Determinants of Health   Financial Resource Strain:   . Difficulty of Paying Living Expenses: Not on file  Food Insecurity:   . Worried About Charity fundraiser in the Last Year: Not on file  . Ran Out of Food in the Last Year: Not on file  Transportation Needs:   . Lack of Transportation (Medical): Not on file  . Lack of Transportation (Non-Medical): Not on file  Physical Activity:   . Days of Exercise per Week: Not on file  . Minutes of Exercise per Session: Not on file  Stress:   . Feeling of Stress : Not on file  Social Connections:   . Frequency of Communication with Friends and Family: Not on file  . Frequency of Social Gatherings with Friends and Family: Not on file  . Attends Religious Services: Not on file  . Active Member of Clubs or Organizations: Not on file  . Attends Archivist Meetings: Not on file  . Marital Status: Not on file  Intimate Partner Violence:   . Fear of Current or Ex-Partner: Not on file  . Emotionally Abused:  Not on file  . Physically Abused: Not on file  . Sexually Abused: Not on file    FAMILY HISTORY:  Family History  Problem Relation Age of Onset  . Heart failure Mother   . Cancer Father     CURRENT MEDICATIONS:  Outpatient Encounter Medications as of 09/01/2019  Medication Sig Note  . acyclovir (ZOVIRAX) 200 MG capsule TAKE ONE CAPSULE BY MOUTH TWICE DAILY   . amoxicillin-clavulanate (AUGMENTIN) 875-125 MG tablet Take 1 tablet by mouth 2 (two) times daily.   Marland Kitchen aspirin EC 81 MG tablet Take 1 tablet (81 mg total) by mouth daily.   Marland Kitchen atorvastatin (LIPITOR) 10 MG tablet TAKE ONE TABLET BY MOUTH DAILY   . Calcium Carbonate-Vit D-Min (CALCIUM 600+D PLUS MINERALS) 600-400 MG-UNIT CHEW Chew 1 tablet by mouth 3 (three) times daily.    Marland Kitchen  cyanocobalamin (,VITAMIN B-12,) 1000 MCG/ML injection Inject 1,000 mcg as directed every 30 (thirty) days.   . daratumumab-hyaluronidase-fihj (DARZALEX FASPRO) 1800-30000 MG-UT/15ML SOLN Inject 1,800 mg into the skin. Weekly weeks 1-8; every 2 weeks weeks 9-24; every 4 weeks after week 24   . dexamethasone (DECADRON) 2 MG tablet Take five tablets (61m) by mouth weekly. (Patient taking differently: Take five tablets (146m by mouth bi-weekly.) 12/26/2018: [facility] instructed patient to take medication weekly  . diltiazem (CARDIZEM CD) 240 MG 24 hr capsule Take 1 capsule (240 mg total) by mouth daily.   . magnesium oxide (MAG-OX) 400 (241.3 Mg) MG tablet Take 1 tablet by mouth 2 (two) times daily.   . metoprolol tartrate (LOPRESSOR) 25 MG tablet Take 1.5 tablets (37.5 mg total) by mouth 2 (two) times daily.   . montelukast (SINGULAIR) 10 MG tablet Take 1 tablet three days prior to your first treatment and the day of treatment. Then take one tablet on the day of treatment there after.   . pomalidomide (POMALYST) 2 MG capsule Take 1 capsule (2 mg total) by mouth daily. Take with water on days 1-21. Repeat every 28 days.   . potassium chloride (K-DUR) 10 MEQ tablet TAKE ONE  TABLET BY MOUTH THREE TIMES DAILY.   . Marland Kitchenmeclidinium-vilanterol (ANORO ELLIPTA) 62.5-25 MCG/INH AEPB Inhale 1 puff into the lungs daily.   . Zoledronic Acid 4 MG SOLR Inject into the vein.   . Marland Kitchenlbuterol (VENTOLIN HFA) 108 (90 Base) MCG/ACT inhaler Inhale 2 puffs into the lungs every 6 (six) hours as needed for wheezing or shortness of breath. (Patient not taking: Reported on 09/01/2019)   . diazepam (VALIUM) 5 MG tablet TAKE ONE TABLET BY MOUTH EVERY 6 HOURS AS NEEDED FOR ANXIETY (Patient not taking: Reported on 09/01/2019)   . fluticasone (FLONASE) 50 MCG/ACT nasal spray Place 2 sprays into both nostrils as needed.    . furosemide (LASIX) 20 MG tablet Take 1 tablet (20 mg total) by mouth daily as needed (as needed for ankle swelling). (Patient not taking: Reported on 09/01/2019)   . guaiFENesin (MUCINEX) 600 MG 12 hr tablet Take 600 mg 2 (two) times daily as needed by mouth for cough or to loosen phlegm.   . ondansetron (ZOFRAN) 8 MG tablet Take 1 tablet (8 mg total) by mouth every 8 (eight) hours as needed for nausea or vomiting.   . pantoprazole (PROTONIX) 40 MG tablet Take 40 mg by mouth as needed.    . polyethylene glycol powder (GLYCOLAX/MIRALAX) 17 GM/SCOOP powder Take by mouth as needed.    . [DISCONTINUED] ondansetron (ZOFRAN) 8 MG tablet Take 1 tablet (8 mg total) by mouth every 8 (eight) hours as needed for nausea or vomiting. (Patient not taking: Reported on 09/01/2019)    No facility-administered encounter medications on file as of 09/01/2019.    ALLERGIES:  Allergies  Allergen Reactions  . Lisinopril Cough  . Plavix [Clopidogrel Bisulfate] Swelling     PHYSICAL EXAM:  ECOG Performance status: 1  There were no vitals filed for this visit. There were no vitals filed for this visit.  Physical Exam Vitals reviewed.  Constitutional:      Appearance: Normal appearance.  Cardiovascular:     Rate and Rhythm: Normal rate and regular rhythm.     Heart sounds: Normal heart sounds.    Pulmonary:     Effort: Pulmonary effort is normal.     Breath sounds: Normal breath sounds.  Abdominal:     General: There is no  distension.     Palpations: Abdomen is soft. There is no mass.  Musculoskeletal:        General: No swelling.  Skin:    General: Skin is warm.  Neurological:     General: No focal deficit present.     Mental Status: She is alert and oriented to person, place, and time.  Psychiatric:        Mood and Affect: Mood normal.        Behavior: Behavior normal.      LABORATORY DATA:  I have reviewed the labs as listed.  CBC    Component Value Date/Time   WBC 13.1 (H) 09/01/2019 0944   RBC 2.81 (L) 09/01/2019 0944   HGB 9.3 (L) 09/01/2019 0944   HCT 30.0 (L) 09/01/2019 0944   PLT 281 09/01/2019 0944   MCV 106.8 (H) 09/01/2019 0944   MCH 33.1 09/01/2019 0944   MCHC 31.0 09/01/2019 0944   RDW 19.0 (H) 09/01/2019 0944   LYMPHSABS 0.3 (L) 09/01/2019 0944   MONOABS 1.9 (H) 09/01/2019 0944   EOSABS 0.0 09/01/2019 0944   BASOSABS 0.1 09/01/2019 0944   CMP Latest Ref Rng & Units 09/01/2019 08/25/2019 08/18/2019  Glucose 70 - 99 mg/dL 136(H) 116(H) 101(H)  BUN 8 - 23 mg/dL 19 22 32(H)  Creatinine 0.44 - 1.00 mg/dL 1.40(H) 1.28(H) 1.27(H)  Sodium 135 - 145 mmol/L 137 139 139  Potassium 3.5 - 5.1 mmol/L 3.5 3.7 3.5  Chloride 98 - 111 mmol/L 102 106 105  CO2 22 - 32 mmol/L _0 Calcium 8.9 - 10.3 mg/dL 8.0(L) 8.0(L) 7.5(L)  Total Protein 6.5 - 8.1 g/dL 6.0(L) 5.9(L) 5.4(L)  Total Bilirubin 0.3 - 1.2 mg/dL 0.7 0.7 1.3(H)  Alkaline Phos 38 - 126 U/L 66 63 70  AST 15 - 41 U/L 13(L) 14(L) 17  ALT 0 - 44 U/L _1 DIAGNOSTIC IMAGING:  I have independently reviewed the scans and discussed with the patient.   I have reviewed Venita Lick LPN's note and agree with the documentation.  I personally performed a face-to-face visit, made revisions and my assessment and plan is as follows.    ASSESSMENT & PLAN:   Multiple myeloma ([facility]) 1.  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 -She received daratumumab last week. -She did not feel well and felt nauseous and very weak.  She also had dyspnea on exertion. -She did not take dexamethasone last Thursday.  She usually takes 5 tablets every Thursday. -I have reviewed her labs.  White count is 13.1.  Creatinine increased to 1.4. -She is still taking Augmentin twice daily given by Dr. Woody Seller started last week for possible sinus infection.  She reported drainage into the back of the throat.  Has some cough but denied any expectoration. -We will hold off on daratumumab today.  We will give her 1 L of fluid with electrolytes. -We will also give her Zofran for nausea.  I will bring her back tomorrow for more fluids. -I have done a chest x-ray in the office and reviewed the films.  No clear evidence of infiltrative process.  2.  Normocytic anemia: -Hemoglobin today is 9.3.  This is from combination of CKD and myelosuppression. -She is receiving Aranesp intermittently.  She also received Feraheme, last 1 on 08/11/2019.  3.  CKD: -Baseline is between 1.2 and 1.3.  Today it increased to 1.4.  She will receive hydration.  4.  Bisphosphonate therapy: -She is continuing Zometa every 3 months.  Last dose was on 07/18/2019. -She will continue calcium and vitamin D supplements.     Orders placed this encounter:  Orders Placed This Encounter  Procedures  . DG Chest 2 View      Derek Jack, [place] [phone]

## 2019-09-01 NOTE — Progress Notes (Signed)
Patient presents today for treatment only. Labs pending. Patient's heart rate is elevated on arrival. 127. Recheck 125. Afebrile. Patient states she has been sick all week since her last treatment on 08/25/19. On 08/26/19 Dr. Woody Seller placed patient on Amoxicillin for a sore throat and sinus drainage per patient's words. Patient states she is currently on antibiotics and has not finished her regimen. Temperature 97.8 today. Patient instructed to inform infusion nurse if she is on antibiotics on day of treatment. Teaching performed and patient verbalized an understanding. Patient complains of a clear drainage from her head that she said makes her gag. Patient states she has not been able to eat.     Patient placed on the schedule to be seen by Dr. Delton Coombes.  Message received from [facility] LPN/ Dr. Delton Coombes. Infuse 1 Liter of Sodium Chloride 0.9% with potassium chloride 20 mEq and magnesium sulfate 2 gram infusion over x hours. Chest x-ray and Zofran 8mg  IVPB.   Treatment given today per MD orders. Tolerated infusion without adverse affects. Vital signs stable. Patient to return tomorrow for lab, BMP and fluids to be determined by Dr. Delton Coombes .No complaints at this time. Discharged from clinic via wheel chair.  F/U with [facility] as scheduled.

## 2019-09-01 NOTE — Patient Instructions (Signed)
[facility]  Discharge Instructions:   _______________________________________________________________  Thank you for choosing [facility] at [facility] to provide your oncology and hematology care.  To afford each patient quality time with our providers, please arrive at least 15 minutes before your scheduled appointment.  You need to re-schedule your appointment if you arrive 10 or more minutes late.  We strive to give you quality time with our providers, and arriving late affects you and other patients whose appointments are after yours.  Also, if you no show three or more times for appointments you may be dismissed from the clinic.  Again, thank you for choosing [facility]. Our hope is that these requests will allow you access to exceptional care and in a timely manner. _______________________________________________________________  If you have questions after your visit, please contact our office at [phone] between the hours of 8:30 a.m. and 5:00 p.m. Voicemails left after 4:30 p.m. will not be returned until the following business day. _______________________________________________________________  For prescription refill requests, have your pharmacy contact our office. _______________________________________________________________  Recommendations made by the consultant and any test results will be sent to your referring physician. _______________________________________________________________ 

## 2019-09-01 NOTE — Patient Instructions (Addendum)
[facility] at [facility] Discharge Instructions  You were seen today by Dr. Katragadda. He went over your recent lab results. He will see you back in  for labs and follow up.   Thank you for choosing [facility] at [facility] to provide your oncology and hematology care.  To afford each patient quality time with our provider, please arrive at least 15 minutes before your scheduled appointment time.   If you have a lab appointment with the Cancer Center please come in thru the  Main Entrance and check in at the main information desk  You need to re-schedule your appointment should you arrive 10 or more minutes late.  We strive to give you quality time with our providers, and arriving late affects you and other patients whose appointments are after yours.  Also, if you no show three or more times for appointments you may be dismissed from the clinic at the providers discretion.     Again, thank you for choosing [facility].  Our hope is that these requests will decrease the amount of time that you wait before being seen by our physicians.       _____________________________________________________________  Should you have questions after your visit to [facility], please contact our office at [phone] between the hours of 8:00 a.m. and 4:30 p.m.  Voicemails left after 4:00 p.m. will not be returned until the following business day.  For prescription refill requests, have your pharmacy contact our office and allow 72 hours.    Cancer Center Support Programs:   > Cancer Support Group  2nd Tuesday of the month 1pm-2pm, Journey Room    

## 2019-09-01 NOTE — Assessment & Plan Note (Signed)
1.  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 -She received daratumumab last week. -She did not feel well and felt nauseous and very weak.  She also had dyspnea on exertion. -She did not take dexamethasone last Thursday.  She usually takes 5 tablets every Thursday. -I have reviewed her labs.  White count is 13.1.  Creatinine increased to 1.4. -She is still taking Augmentin twice daily given by Dr. Woody Seller started last week for possible sinus infection.  She reported drainage into the back of the throat.  Has some cough but denied any expectoration. -We will hold off on daratumumab today.  We will give her 1 L of fluid with electrolytes. -We will also give her Zofran for nausea.  I will bring her back tomorrow for more fluids. -I have done a chest x-ray in the office and reviewed the films.  No clear evidence of infiltrative process.  2.  Normocytic anemia: -Hemoglobin today is 9.3.  This is from combination of CKD and myelosuppression. -She is receiving Aranesp intermittently.  She also received Feraheme, last 1 on 08/11/2019.  3.  CKD: -Baseline is between 1.2 and 1.3.  Today it increased to 1.4.  She will receive hydration.  4.  Bisphosphonate therapy: -She is continuing Zometa every 3 months.  Last dose was on 07/18/2019. -She will continue calcium and vitamin D supplements.

## 2019-09-02 ENCOUNTER — Inpatient Hospital Stay (HOSPITAL_COMMUNITY): Payer: Medicare Other

## 2019-09-02 VITALS — BP 97/43 | HR 101 | Temp 97.9°F | Resp 18

## 2019-09-02 DIAGNOSIS — N183 Chronic kidney disease, stage 3 unspecified: Secondary | ICD-10-CM

## 2019-09-02 DIAGNOSIS — D631 Anemia in chronic kidney disease: Secondary | ICD-10-CM

## 2019-09-02 DIAGNOSIS — D509 Iron deficiency anemia, unspecified: Secondary | ICD-10-CM

## 2019-09-02 DIAGNOSIS — E538 Deficiency of other specified B group vitamins: Secondary | ICD-10-CM

## 2019-09-02 DIAGNOSIS — C9000 Multiple myeloma not having achieved remission: Secondary | ICD-10-CM

## 2019-09-02 LAB — BASIC METABOLIC PANEL
Anion gap: 11 (ref 5–15)
BUN: 24 mg/dL — ABNORMAL HIGH (ref 8–23)
CO2: 22 mmol/L (ref 22–32)
Calcium: 7.8 mg/dL — ABNORMAL LOW (ref 8.9–10.3)
Chloride: 106 mmol/L (ref 98–111)
Creatinine, Ser: 1.46 mg/dL — ABNORMAL HIGH (ref 0.44–1.00)
GFR calc Af Amer: 40 mL/min — ABNORMAL LOW (ref >60)
GFR calc non Af Amer: 34 mL/min — ABNORMAL LOW (ref >60)
Glucose, Bld: 145 mg/dL — ABNORMAL HIGH (ref 70–99)
Potassium: 3.9 mmol/L (ref 3.5–5.1)
Sodium: 139 mmol/L (ref 135–145)

## 2019-09-02 MED ORDER — LEVOFLOXACIN 250 MG PO TABS: 250.0000 mg | ORAL_TABLET | Freq: Every day | ORAL | 0 refills | Status: DC

## 2019-09-02 MED ORDER — SODIUM CHLORIDE 0.9 % IV SOLN: INTRAVENOUS | Status: DC

## 2019-09-02 MED ORDER — SODIUM CHLORIDE 0.9 % IV SOLN
Freq: Once | INTRAVENOUS | Status: AC
  Filled 2019-09-02: qty 4

## 2019-09-02 NOTE — Progress Notes (Signed)
Patient tolerated hydration with no complaints voiced.  Peripheral IV site clean and dry with no bruising or swelling noted.  No complaints of pain at site.  Band aid applied.  VSS with discharge and left by wheelchair with no s/s of distress noted.

## 2019-09-02 NOTE — Progress Notes (Signed)
Patient came into clinic today for hydration fluids and labs. Patient states she feels better today. She has been able to keep down some ensure today.  Labs reviewed with MD today. Will give one liter of plain normal saline over 2 hours and patient is requesting some nausea medication that she got yesterday. Will give per orders.

## 2019-09-05 ENCOUNTER — Other Ambulatory Visit: Payer: Self-pay

## 2019-09-05 ENCOUNTER — Emergency Department (HOSPITAL_COMMUNITY): Payer: Medicare Other

## 2019-09-05 ENCOUNTER — Other Ambulatory Visit (HOSPITAL_COMMUNITY): Payer: Self-pay | Admitting: Family Medicine

## 2019-09-05 ENCOUNTER — Other Ambulatory Visit (HOSPITAL_COMMUNITY): Payer: Self-pay | Admitting: Nurse Practitioner

## 2019-09-05 ENCOUNTER — Inpatient Hospital Stay (HOSPITAL_COMMUNITY): Payer: Medicare Other

## 2019-09-05 ENCOUNTER — Emergency Department (HOSPITAL_BASED_OUTPATIENT_CLINIC_OR_DEPARTMENT_OTHER): Payer: Medicare Other

## 2019-09-05 ENCOUNTER — Inpatient Hospital Stay (HOSPITAL_COMMUNITY)
Admission: EM | Admit: 2019-09-05 | Discharge: 2019-09-15 | DRG: 377 | Disposition: A | Discharge status: Home Health | Payer: Medicare Other | Source: Ambulatory Visit | Attending: Internal Medicine | Admitting: Internal Medicine

## 2019-09-05 ENCOUNTER — Encounter (HOSPITAL_COMMUNITY): Payer: Self-pay

## 2019-09-05 VITALS — BP 95/43 | HR 98 | Temp 96.9°F | Resp 20

## 2019-09-05 DIAGNOSIS — Z7982 Long term (current) use of aspirin: Secondary | ICD-10-CM

## 2019-09-05 DIAGNOSIS — Z87891 Personal history of nicotine dependence: Secondary | ICD-10-CM

## 2019-09-05 DIAGNOSIS — D62 Acute posthemorrhagic anemia: Secondary | ICD-10-CM | POA: Diagnosis present

## 2019-09-05 DIAGNOSIS — K5711 Diverticulosis of small intestine without perforation or abscess with bleeding: Secondary | ICD-10-CM | POA: Diagnosis present

## 2019-09-05 DIAGNOSIS — I2510 Atherosclerotic heart disease of native coronary artery without angina pectoris: Secondary | ICD-10-CM | POA: Diagnosis present

## 2019-09-05 DIAGNOSIS — Q2733 Arteriovenous malformation of digestive system vessel: Secondary | ICD-10-CM | POA: Diagnosis not present

## 2019-09-05 DIAGNOSIS — I5032 Chronic diastolic (congestive) heart failure: Secondary | ICD-10-CM

## 2019-09-05 DIAGNOSIS — E538 Deficiency of other specified B group vitamins: Secondary | ICD-10-CM

## 2019-09-05 DIAGNOSIS — J439 Emphysema, unspecified: Secondary | ICD-10-CM | POA: Diagnosis present

## 2019-09-05 DIAGNOSIS — I25119 Atherosclerotic heart disease of native coronary artery with unspecified angina pectoris: Secondary | ICD-10-CM

## 2019-09-05 DIAGNOSIS — J431 Panlobular emphysema: Secondary | ICD-10-CM | POA: Diagnosis not present

## 2019-09-05 DIAGNOSIS — D631 Anemia in chronic kidney disease: Secondary | ICD-10-CM

## 2019-09-05 DIAGNOSIS — I248 Other forms of acute ischemic heart disease: Secondary | ICD-10-CM | POA: Diagnosis present

## 2019-09-05 DIAGNOSIS — N1831 Chronic kidney disease, stage 3a: Secondary | ICD-10-CM | POA: Diagnosis present

## 2019-09-05 DIAGNOSIS — I130 Hypertensive heart and chronic kidney disease with heart failure and stage 1 through stage 4 chronic kidney disease, or unspecified chronic kidney disease: Secondary | ICD-10-CM | POA: Diagnosis not present

## 2019-09-05 DIAGNOSIS — C9000 Multiple myeloma not having achieved remission: Secondary | ICD-10-CM | POA: Diagnosis not present

## 2019-09-05 DIAGNOSIS — D619 Aplastic anemia, unspecified: Secondary | ICD-10-CM | POA: Diagnosis present

## 2019-09-05 DIAGNOSIS — I5033 Acute on chronic diastolic (congestive) heart failure: Secondary | ICD-10-CM | POA: Diagnosis not present

## 2019-09-05 DIAGNOSIS — I10 Essential (primary) hypertension: Secondary | ICD-10-CM | POA: Diagnosis not present

## 2019-09-05 DIAGNOSIS — J189 Pneumonia, unspecified organism: Secondary | ICD-10-CM | POA: Diagnosis present

## 2019-09-05 DIAGNOSIS — N179 Acute kidney failure, unspecified: Secondary | ICD-10-CM | POA: Diagnosis present

## 2019-09-05 DIAGNOSIS — D509 Iron deficiency anemia, unspecified: Secondary | ICD-10-CM

## 2019-09-05 DIAGNOSIS — R0902 Hypoxemia: Secondary | ICD-10-CM

## 2019-09-05 DIAGNOSIS — I959 Hypotension, unspecified: Secondary | ICD-10-CM

## 2019-09-05 DIAGNOSIS — I509 Heart failure, unspecified: Secondary | ICD-10-CM | POA: Diagnosis not present

## 2019-09-05 DIAGNOSIS — Z8249 Family history of ischemic heart disease and other diseases of the circulatory system: Secondary | ICD-10-CM

## 2019-09-05 DIAGNOSIS — J9600 Acute respiratory failure, unspecified whether with hypoxia or hypercapnia: Secondary | ICD-10-CM

## 2019-09-05 DIAGNOSIS — E870 Hyperosmolality and hypernatremia: Secondary | ICD-10-CM | POA: Diagnosis not present

## 2019-09-05 DIAGNOSIS — D6189 Other specified aplastic anemias and other bone marrow failure syndromes: Secondary | ICD-10-CM | POA: Diagnosis not present

## 2019-09-05 DIAGNOSIS — E669 Obesity, unspecified: Secondary | ICD-10-CM | POA: Diagnosis present

## 2019-09-05 DIAGNOSIS — D638 Anemia in other chronic diseases classified elsewhere: Secondary | ICD-10-CM | POA: Diagnosis present

## 2019-09-05 DIAGNOSIS — E861 Hypovolemia: Secondary | ICD-10-CM | POA: Diagnosis not present

## 2019-09-05 DIAGNOSIS — E785 Hyperlipidemia, unspecified: Secondary | ICD-10-CM | POA: Diagnosis present

## 2019-09-05 DIAGNOSIS — R531 Weakness: Secondary | ICD-10-CM

## 2019-09-05 DIAGNOSIS — Z79899 Other long term (current) drug therapy: Secondary | ICD-10-CM

## 2019-09-05 DIAGNOSIS — Z20822 Contact with and (suspected) exposure to covid-19: Secondary | ICD-10-CM | POA: Diagnosis not present

## 2019-09-05 DIAGNOSIS — D649 Anemia, unspecified: Secondary | ICD-10-CM | POA: Diagnosis not present

## 2019-09-05 DIAGNOSIS — Z6825 Body mass index (BMI) 25.0-25.9, adult: Secondary | ICD-10-CM | POA: Diagnosis not present

## 2019-09-05 DIAGNOSIS — R578 Other shock: Secondary | ICD-10-CM | POA: Diagnosis present

## 2019-09-05 DIAGNOSIS — K449 Diaphragmatic hernia without obstruction or gangrene: Secondary | ICD-10-CM | POA: Diagnosis present

## 2019-09-05 DIAGNOSIS — Z955 Presence of coronary angioplasty implant and graft: Secondary | ICD-10-CM

## 2019-09-05 DIAGNOSIS — K254 Chronic or unspecified gastric ulcer with hemorrhage: Principal | ICD-10-CM | POA: Diagnosis present

## 2019-09-05 DIAGNOSIS — R778 Other specified abnormalities of plasma proteins: Secondary | ICD-10-CM

## 2019-09-05 DIAGNOSIS — R7989 Other specified abnormal findings of blood chemistry: Secondary | ICD-10-CM | POA: Diagnosis present

## 2019-09-05 DIAGNOSIS — N183 Chronic kidney disease, stage 3 unspecified: Secondary | ICD-10-CM | POA: Diagnosis present

## 2019-09-05 DIAGNOSIS — J9601 Acute respiratory failure with hypoxia: Secondary | ICD-10-CM | POA: Diagnosis present

## 2019-09-05 DIAGNOSIS — Z888 Allergy status to other drugs, medicaments and biological substances status: Secondary | ICD-10-CM

## 2019-09-05 DIAGNOSIS — G909 Disorder of the autonomic nervous system, unspecified: Secondary | ICD-10-CM | POA: Diagnosis present

## 2019-09-05 DIAGNOSIS — K922 Gastrointestinal hemorrhage, unspecified: Secondary | ICD-10-CM

## 2019-09-05 LAB — COMPREHENSIVE METABOLIC PANEL
ALT: 13 U/L (ref 0–44)
AST: 21 U/L (ref 15–41)
Albumin: 2.3 g/dL — ABNORMAL LOW (ref 3.5–5.0)
Alkaline Phosphatase: 77 U/L (ref 38–126)
Anion gap: 10 (ref 5–15)
BUN: 24 mg/dL — ABNORMAL HIGH (ref 8–23)
CO2: 21 mmol/L — ABNORMAL LOW (ref 22–32)
Calcium: 7.8 mg/dL — ABNORMAL LOW (ref 8.9–10.3)
Chloride: 108 mmol/L (ref 98–111)
Creatinine, Ser: 1.45 mg/dL — ABNORMAL HIGH (ref 0.44–1.00)
GFR calc Af Amer: 40 mL/min — ABNORMAL LOW (ref >60)
GFR calc non Af Amer: 34 mL/min — ABNORMAL LOW (ref >60)
Glucose, Bld: 129 mg/dL — ABNORMAL HIGH (ref 70–99)
Potassium: 4.7 mmol/L (ref 3.5–5.1)
Sodium: 139 mmol/L (ref 135–145)
Total Bilirubin: 0.6 mg/dL (ref 0.3–1.2)
Total Protein: 5.7 g/dL — ABNORMAL LOW (ref 6.5–8.1)

## 2019-09-05 LAB — CBC WITH DIFFERENTIAL/PLATELET
Abs Immature Granulocytes: 0.06 10*3/uL (ref 0.00–0.07)
Basophils Absolute: 0.1 10*3/uL (ref 0.0–0.1)
Basophils Relative: 1 %
Eosinophils Absolute: 0 10*3/uL (ref 0.0–0.5)
Eosinophils Relative: 0 %
HCT: 26.7 % — ABNORMAL LOW (ref 36.0–46.0)
Hemoglobin: 7.8 g/dL — ABNORMAL LOW (ref 12.0–15.0)
Immature Granulocytes: 1 %
Lymphocytes Relative: 3 %
Lymphs Abs: 0.4 10*3/uL — ABNORMAL LOW (ref 0.7–4.0)
MCH: 32.9 pg (ref 26.0–34.0)
MCHC: 29.2 g/dL — ABNORMAL LOW (ref 30.0–36.0)
MCV: 112.7 fL — ABNORMAL HIGH (ref 80.0–100.0)
Monocytes Absolute: 1.4 10*3/uL — ABNORMAL HIGH (ref 0.1–1.0)
Monocytes Relative: 11 %
Neutro Abs: 11.2 10*3/uL — ABNORMAL HIGH (ref 1.7–7.7)
Neutrophils Relative %: 84 %
Platelets: 470 10*3/uL — ABNORMAL HIGH (ref 150–400)
RBC: 2.37 MIL/uL — ABNORMAL LOW (ref 3.87–5.11)
RDW: 20.5 % — ABNORMAL HIGH (ref 11.5–15.5)
WBC: 13.1 10*3/uL — ABNORMAL HIGH (ref 4.0–10.5)
nRBC: 0.2 % (ref 0.0–0.2)

## 2019-09-05 LAB — RESPIRATORY PANEL BY RT PCR (FLU A&B, COVID)
Influenza A by PCR: NEGATIVE
Influenza B by PCR: NEGATIVE
SARS Coronavirus 2 by RT PCR: NEGATIVE

## 2019-09-05 LAB — ECHOCARDIOGRAM COMPLETE: Weight: 2645.52 oz

## 2019-09-05 LAB — TROPONIN I (HIGH SENSITIVITY)
Troponin I (High Sensitivity): 943 ng/L (ref <18)
Troponin I (High Sensitivity): 1177 ng/L (ref <18)

## 2019-09-05 LAB — BRAIN NATRIURETIC PEPTIDE: B Natriuretic Peptide: 1489 pg/mL — ABNORMAL HIGH (ref 0.0–100.0)

## 2019-09-05 LAB — PREPARE RBC (CROSSMATCH)

## 2019-09-05 LAB — POC SARS CORONAVIRUS 2 AG -  ED: SARS Coronavirus 2 Ag: NEGATIVE

## 2019-09-05 LAB — PROCALCITONIN: Procalcitonin: 0.35 ng/mL

## 2019-09-05 MED ORDER — DILTIAZEM HCL ER COATED BEADS 240 MG PO CP24
240.0000 mg | ORAL_CAPSULE | Freq: Every day | ORAL | Status: DC
  Administered 2019-09-06 – 2019-09-07 (×2): 240 mg via ORAL
  Filled 2019-09-05: qty 2
  Filled 2019-09-05: qty 1

## 2019-09-05 MED ORDER — SODIUM CHLORIDE 0.9% FLUSH: 3.0000 mL | INTRAVENOUS | Status: DC | PRN

## 2019-09-05 MED ORDER — SODIUM CHLORIDE 0.9 % IV SOLN
Freq: Once | INTRAVENOUS | Status: DC
  Filled 2019-09-05: qty 1000

## 2019-09-05 MED ORDER — FUROSEMIDE 10 MG/ML IJ SOLN
20.0000 mg | Freq: Once | INTRAMUSCULAR | Status: AC
  Administered 2019-09-05: 20 mg via INTRAVENOUS
  Filled 2019-09-05: qty 2

## 2019-09-05 MED ORDER — SODIUM CHLORIDE 0.9% FLUSH
3.0000 mL | Freq: Two times a day (BID) | INTRAVENOUS | Status: DC
  Administered 2019-09-05 – 2019-09-15 (×19): 3 mL via INTRAVENOUS

## 2019-09-05 MED ORDER — ASPIRIN EC 81 MG PO TBEC
81.0000 mg | DELAYED_RELEASE_TABLET | Freq: Every day | ORAL | Status: DC
  Administered 2019-09-06 – 2019-09-09 (×4): 81 mg via ORAL
  Filled 2019-09-05 (×4): qty 1

## 2019-09-05 MED ORDER — FUROSEMIDE 10 MG/ML IJ SOLN
40.0000 mg | Freq: Once | INTRAMUSCULAR | Status: AC
  Administered 2019-09-05: 14:00:00 40 mg via INTRAVENOUS
  Filled 2019-09-05: qty 4

## 2019-09-05 MED ORDER — FUROSEMIDE 10 MG/ML IJ SOLN
60.0000 mg | Freq: Two times a day (BID) | INTRAMUSCULAR | Status: DC
  Administered 2019-09-06 (×2): 60 mg via INTRAVENOUS
  Filled 2019-09-05 (×2): qty 6

## 2019-09-05 MED ORDER — SODIUM CHLORIDE 0.9% IV SOLUTION: Freq: Once | INTRAVENOUS | Status: DC

## 2019-09-05 MED ORDER — ALBUTEROL SULFATE (2.5 MG/3ML) 0.083% IN NEBU
3.0000 mL | INHALATION_SOLUTION | Freq: Four times a day (QID) | RESPIRATORY_TRACT | Status: DC | PRN
  Administered 2019-09-09: 3 mL via RESPIRATORY_TRACT
  Filled 2019-09-05: qty 3

## 2019-09-05 MED ORDER — METOPROLOL TARTRATE 25 MG PO TABS
37.5000 mg | ORAL_TABLET | Freq: Two times a day (BID) | ORAL | Status: DC
  Administered 2019-09-05 – 2019-09-07 (×4): 37.5 mg via ORAL
  Filled 2019-09-05 (×4): qty 2

## 2019-09-05 MED ORDER — UMECLIDINIUM-VILANTEROL 62.5-25 MCG/INH IN AEPB
1.0000 | INHALATION_SPRAY | Freq: Every day | RESPIRATORY_TRACT | Status: DC
  Administered 2019-09-06 – 2019-09-15 (×9): 1 via RESPIRATORY_TRACT
  Filled 2019-09-05: qty 14

## 2019-09-05 MED ORDER — MONTELUKAST SODIUM 10 MG PO TABS
10.0000 mg | ORAL_TABLET | Freq: Every day | ORAL | Status: DC
  Administered 2019-09-05 – 2019-09-14 (×9): 10 mg via ORAL
  Filled 2019-09-05 (×11): qty 1

## 2019-09-05 MED ORDER — SODIUM CHLORIDE 0.9 % IV SOLN
1.0000 g | Freq: Once | INTRAVENOUS | Status: AC
  Administered 2019-09-05: 1 g via INTRAVENOUS
  Filled 2019-09-05: qty 10

## 2019-09-05 MED ORDER — FLUTICASONE PROPIONATE 50 MCG/ACT NA SUSP
2.0000 | Freq: Every day | NASAL | Status: DC
  Administered 2019-09-11 – 2019-09-12 (×2): 2 via NASAL
  Filled 2019-09-05: qty 16

## 2019-09-05 MED ORDER — SODIUM CHLORIDE 0.9 % IV SOLN
500.0000 mg | INTRAVENOUS | Status: AC
  Administered 2019-09-06 – 2019-09-10 (×5): 500 mg via INTRAVENOUS
  Filled 2019-09-05 (×5): qty 500

## 2019-09-05 MED ORDER — AZITHROMYCIN 250 MG PO TABS
500.0000 mg | ORAL_TABLET | Freq: Once | ORAL | Status: AC
  Administered 2019-09-05: 500 mg via ORAL
  Filled 2019-09-05: qty 2

## 2019-09-05 MED ORDER — SODIUM CHLORIDE 0.9 % IV SOLN: 250.0000 mL | INTRAVENOUS | Status: DC | PRN

## 2019-09-05 MED ORDER — ACYCLOVIR 200 MG PO CAPS
200.0000 mg | ORAL_CAPSULE | Freq: Two times a day (BID) | ORAL | Status: DC
  Administered 2019-09-05 – 2019-09-15 (×19): 200 mg via ORAL
  Filled 2019-09-05 (×22): qty 1

## 2019-09-05 MED ORDER — ATORVASTATIN CALCIUM 10 MG PO TABS
10.0000 mg | ORAL_TABLET | Freq: Every day | ORAL | Status: DC
  Administered 2019-09-06 – 2019-09-14 (×8): 10 mg via ORAL
  Filled 2019-09-05 (×7): qty 1

## 2019-09-05 MED ORDER — ENOXAPARIN SODIUM 40 MG/0.4ML ~~LOC~~ SOLN
40.0000 mg | SUBCUTANEOUS | Status: DC
  Administered 2019-09-06: 12:00:00 40 mg via SUBCUTANEOUS
  Filled 2019-09-05: qty 0.4

## 2019-09-05 MED ORDER — SODIUM CHLORIDE 0.9 % IV SOLN
Freq: Once | INTRAVENOUS | Status: DC
  Filled 2019-09-05: qty 4

## 2019-09-05 MED ORDER — SODIUM CHLORIDE 0.9 % IV SOLN: 10.0000 mL/h | Freq: Once | INTRAVENOUS | Status: DC

## 2019-09-05 MED ORDER — ACETAMINOPHEN 325 MG PO TABS: 650.0000 mg | ORAL_TABLET | ORAL | Status: DC | PRN

## 2019-09-05 MED ORDER — PANTOPRAZOLE SODIUM 40 MG PO TBEC
40.0000 mg | DELAYED_RELEASE_TABLET | Freq: Every day | ORAL | Status: DC
  Administered 2019-09-06 – 2019-09-09 (×4): 40 mg via ORAL
  Filled 2019-09-05 (×4): qty 1

## 2019-09-05 MED ORDER — ONDANSETRON HCL 4 MG/2ML IJ SOLN
4.0000 mg | Freq: Four times a day (QID) | INTRAMUSCULAR | Status: DC | PRN
  Administered 2019-09-06 – 2019-09-09 (×6): 4 mg via INTRAVENOUS
  Filled 2019-09-05 (×6): qty 2

## 2019-09-05 MED ORDER — POTASSIUM CHLORIDE CRYS ER 10 MEQ PO TBCR
10.0000 meq | EXTENDED_RELEASE_TABLET | Freq: Three times a day (TID) | ORAL | Status: DC
  Administered 2019-09-05 – 2019-09-12 (×20): 10 meq via ORAL
  Filled 2019-09-05 (×22): qty 1

## 2019-09-05 MED ORDER — SODIUM CHLORIDE 0.9 % IV SOLN
2.0000 g | INTRAVENOUS | Status: AC
  Administered 2019-09-06 – 2019-09-10 (×5): 2 g via INTRAVENOUS
  Filled 2019-09-05: qty 2
  Filled 2019-09-05 (×2): qty 20
  Filled 2019-09-05 (×2): qty 2

## 2019-09-05 NOTE — ED Triage Notes (Addendum)
Pt brought down from cancer center due to weakness since Friday and vomiting. Plus blood counts are low. O2 sats 56% on RA O2 initiated

## 2019-09-05 NOTE — ED Notes (Signed)
Date and time results received: 09/05/19 4:20 PM  (use smartphrase ".now" to insert current time)  Test: troponin  Critical Value: 1177  Name of Provider Notified:Julie Idol   Orders Received? Or Actions Taken?:

## 2019-09-05 NOTE — ED Notes (Signed)
Date and time results received: 09/05/19 2:17 PM    Test: trop Critical Value: [id number]  Name of Provider Notified: Evalee Jefferson  Orders Received? Or Actions Taken?:

## 2019-09-05 NOTE — Progress Notes (Signed)
*  PRELIMINARY RESULTS* Echocardiogram 2D Echocardiogram has been performed.  Carol Bradley 09/05/2019, 2:08 PM

## 2019-09-05 NOTE — ED Provider Notes (Signed)
  Face-to-face evaluation   History: Patient presents here from oncology visit, for evaluation of possible pneumonia.  She came to the infusion center today, was noted to be short of breath and put on nasal cannula oxygen.  She has been receiving treatment for multiple myeloma and has been stable.  She had her first Covid vaccine, 3 weeks ago and is due to have her second, next week.   Physical exam: Alert elderly female.  No respiratory distress.  She is lucid.  No dysarthria or aphasia.  Medical screening examination/treatment/procedure(s) were conducted as a shared visit with non-physician practitioner(s) and myself.  I personally evaluated the patient during the encounter    Daleen Bo, MD 09/08/19 [phone]

## 2019-09-05 NOTE — ED Notes (Signed)
Pt at 5 L via [place]. RT notified and will be down to put pt on hi flow O2

## 2019-09-05 NOTE — ED Provider Notes (Signed)
[facility] Provider Note   CSN: [id number] Arrival date & time: 09/05/19  1210     History Chief Complaint  Patient presents with  . Weakness    Carol Bradley is a 79 y.o. female with a history significant for COPD, CAD, hypertension, multiple myeloma currently receiving chemotherapy presenting from the cancer center for further evaluation of a 7-day history of generalized weakness along with shortness of breath, nausea and vomiting.  She states her chemotherapy medication frequently makes her nauseated with poor appetite, stating she has had very poor p.o. intake for the past 5 days.  She went to the cancer center today for some IV fluids at which time it was determined her hemoglobin was low at 7.8 (was 9.3 four days ago) and was also found to be hypoxic to 56% on room air.  She denies chest pain, cough, orthopnea or peripheral edema. She also denies cough, no orthopnea but does endorse gets sob with exertion.  She was placed on oxygen prior to arrival, otherwise has had no treatment for her symptoms.    Per review of chart, negative myocardial perfusion study 01/01/18.    HPI     Past Medical History:  Diagnosis Date  . 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 CAD (coronary artery disease)   . COPD (chronic obstructive pulmonary disease) ([place])   . History of tobacco abuse   . Hyperlipidemia   . Hypertension   . 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 Multiple myeloma ([place])   . Vitamin B12 deficiency 04/15/2016   Overview:  Vitamin B12 level documented 155, January 2016 with the normal range being 211-924    Patient Active Problem List   Diagnosis Date Noted  . Iron deficiency anemia 07/10/2017  . Vitamin B12 deficiency 04/15/2016  . Goals of care, counseling/discussion 04/15/2016  . 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 Emphysema/COPD ([place]) 01/15/2016  .  Chronic diastolic congestive heart failure ([place]) 12/13/2015  . Hypokalemia 11/02/2015  . History of heart disease 11/02/2015  . 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 Incontinence of bowel 08/24/2015  . 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 Severe low back pain 05/10/2015  . Multiple myeloma ([place]) 04/12/2015  . Anemia due to bone marrow failure ([place]) 04/02/2015  . Cramps, extremity 04/01/2015  . Encounter for chemotherapy management 04/01/2015  . Hematoma of hip, right, sequela 04/01/2015  . Chronic renal disease, stage 3, moderately decreased glomerular filtration rate between 30-59 mL/min/1.73 square meter 02/12/2015  . Elevated serum protein level 02/12/2015  . Hypoalbuminemia 02/12/2015  . Dyspnea on exertion 04/29/2014  . Dyspepsia 05/19/2011  . CAD (coronary artery disease)   . Hypertension   . Hyperlipidemia     Past Surgical History:  Procedure Laterality Date  . BACK SURGERY    . CARDIAC CATHETERIZATION  04/2011   right and left cath showing normal right heart pressures,but newly diagnosed coronary artery disease/drug eluting stent placed to RCA with residual disease in the proximal RCA and LAD and ramus, normal LV function and 60-65% EF  . COLONOSCOPY N/A 09/30/2014   Procedure: COLONOSCOPY;  Surgeon: Rogene Houston, MD;  Location: [facility];  Service: Endoscopy;  Laterality: N/A;  225  . CORONARY ANGIOPLASTY WITH STENT PLACEMENT  04/2011   mid RCA: 3.0 X38 mm Promus DES. Residual 40% disease proximally  . NECK SURGERY       OB History  No obstetric history on file.     Family History  Problem Relation Age of Onset  . Heart failure Mother   . Cancer Father     Social History   Tobacco Use  . Smoking status: Former Smoker    Packs/day: 3.00    Years: 25.00    Pack years: 75.00    Types: Cigarettes    Quit date: 07/10/1994    Years since quitting: 25.1  . Smokeless tobacco: Never Used  Substance Use Topics  . Alcohol use: No  . Drug use: Never      Home Medications Prior to Admission medications   Medication Sig Start Date End Date Taking? Authorizing Provider  acyclovir (ZOVIRAX) 200 MG capsule TAKE ONE CAPSULE BY MOUTH TWICE DAILY 06/16/19   Derek Jack, MD  albuterol (VENTOLIN HFA) 108 (90 Base) MCG/ACT inhaler Inhale 2 puffs into the lungs every 6 (six) hours as needed for wheezing or shortness of breath. Patient not taking: Reported on 09/01/2019 07/23/19   Derek Jack, MD  aspirin EC 81 MG tablet Take 1 tablet (81 mg total) by mouth daily. 01/09/17   Martinique, Peter M, MD  atorvastatin (LIPITOR) 10 MG tablet TAKE ONE TABLET BY MOUTH DAILY 12/09/18   [place], Peter M, MD  Calcium Carbonate-Vit D-Min (CALCIUM 600+D PLUS MINERALS) 600-400 MG-UNIT CHEW Chew 1 tablet by mouth 3 (three) times daily.     [provider]  cyanocobalamin (,VITAMIN B-12,) 1000 MCG/ML injection Inject 1,000 mcg as directed every 30 (thirty) days.    [provider]  daratumumab-hyaluronidase-fihj (DARZALEX FASPRO) 1800-30000 MG-UT/15ML SOLN Inject 1,800 mg into the skin. Weekly weeks 1-8; every 2 weeks weeks 9-24; every 4 weeks after week 24 07/28/19   [provider]  dexamethasone (DECADRON) 2 MG tablet Take five tablets (81m) by mouth weekly. Patient taking differently: Take five tablets (145m by mouth bi-weekly. 11/13/18   KaDerek JackMD  diazepam (VALIUM) 5 MG tablet TAKE ONE TABLET BY MOUTH EVERY 6 HOURS AS NEEDED FOR ANXIETY Patient not taking: Reported on 09/01/2019 10/04/18   KaDerek JackMD  diltiazem (CARDIZEM CD) 240 MG 24 hr capsule Take 1 capsule (240 mg total) by mouth daily. 01/17/19   JoMartiniquePeter M, MD  fluticasone ([facility] MCG/ACT nasal spray Place 2 sprays into both nostrils as needed.     [provider]  furosemide (LASIX) 20 MG tablet Take 1 tablet (20 mg total) by mouth daily as needed (as needed for ankle swelling). Patient not taking: Reported on 09/01/2019 04/07/19    JoMartiniquePeter M, MD  guaiFENesin (MUCINEX) 600 MG 12 hr tablet Take 600 mg 2 (two) times daily as needed by mouth for cough or to loosen phlegm.    [provider]  levofloxacin (LEVAQUIN) 250 MG tablet Take 1 tablet (250 mg total) by mouth daily. Take tablets today then one tablet daily starting tomorrow 09/02/19   KaDerek JackMD  magnesium oxide (MAG-OX) 400 (241.3 Mg) MG tablet Take 1 tablet by mouth 2 (two) times daily. 04/10/19   [provider]  metoprolol tartrate (LOPRESSOR) 25 MG tablet Take 1.5 tablets (37.5 mg total) by mouth 2 (two) times daily. 06/13/19   JoMartiniquePeter M, MD  montelukast (SINGULAIR) 10 MG tablet Take 1 tablet three days prior to your first treatment and the day of treatment. Then take one tablet on the day of treatment there after. 07/23/19   KaDerek JackMD  ondansetron (ZOFRAN) 8 MG tablet Take 1 tablet (  8 mg total) by mouth every 8 (eight) hours as needed for nausea or vomiting. 09/01/19   Derek Jack, MD  pantoprazole (PROTONIX) 40 MG tablet Take 40 mg by mouth as needed.  09/19/18   [provider]  polyethylene glycol powder (GLYCOLAX/MIRALAX) 17 GM/SCOOP powder Take by mouth as needed.     [provider]  pomalidomide (POMALYST) 2 MG capsule Take 1 capsule (2 mg total) by mouth daily. Take with water on days 1-21. Repeat every 28 days. 08/06/19   Derek Jack, MD  potassium chloride (K-DUR) 10 MEQ tablet TAKE ONE TABLET BY MOUTH THREE TIMES DAILY. 03/20/19   Lockamy, Randi L, NP-C  umeclidinium-vilanterol (ANORO ELLIPTA) 62.5-25 MCG/INH AEPB Inhale 1 puff into the lungs daily. 03/25/19   Derek Jack, MD  Zoledronic Acid 4 MG SOLR Inject into the vein.    [provider]    Allergies    Lisinopril and Plavix [clopidogrel bisulfate]  Review of Systems   Review of Systems  Constitutional: Positive for appetite change and fatigue. Negative for chills and fever.  HENT: Negative.   Negative for congestion.   Eyes: Negative.   Respiratory: Positive for shortness of breath. Negative for chest tightness and wheezing.   Cardiovascular: Negative for chest pain, palpitations and leg swelling.  Gastrointestinal: Positive for nausea and vomiting. Negative for abdominal pain.  Genitourinary: Positive for decreased urine volume.  Musculoskeletal: Negative for arthralgias, joint swelling and neck pain.  Skin: Negative.  Negative for rash and wound.  Neurological: Positive for weakness. Negative for dizziness, light-headedness, numbness and headaches.  Psychiatric/Behavioral: Negative.     Physical Exam Updated Vital Signs BP (!) 108/55   Pulse (!) 103   Temp 98.3 F (36.8 C)   Resp (!) 23   Wt 75 kg   SpO2 100%   BMI 25.90 kg/m   Physical Exam Vitals and nursing note reviewed.  Constitutional:      Appearance: She is well-developed.  HENT:     Head: Normocephalic and atraumatic.  Eyes:     Comments: Conjunctival pallor  Cardiovascular:     Rate and Rhythm: Normal rate and regular rhythm.     Heart sounds: Normal heart sounds.  Pulmonary:     Effort: Pulmonary effort is normal.     Breath sounds: Rales present. No wheezing.     Comments: Bilateral rales at bases.  No wheezing. Abdominal:     General: Bowel sounds are normal.     Palpations: Abdomen is soft.     Tenderness: There is no abdominal tenderness.  Musculoskeletal:        General: Normal range of motion.     Cervical back: Normal range of motion.  Skin:    General: Skin is warm and dry.  Neurological:     Mental Status: She is alert.     ED Results / Procedures / Treatments   Labs (all labs ordered are listed, but only abnormal results are displayed) Labs Reviewed  TROPONIN I (HIGH SENSITIVITY) - Abnormal; Notable for the following components:      Result Value   Troponin I (High Sensitivity) 943 (*)    All other components within normal limits  TROPONIN I (HIGH SENSITIVITY) - Abnormal;  Notable for the following components:   Troponin I (High Sensitivity) 1,177 (*)    All other components within normal limits  RESPIRATORY PANEL BY RT PCR (FLU A&B, COVID)  CULTURE, BLOOD (ROUTINE X 2)  CULTURE, BLOOD (ROUTINE X 2)  PROCALCITONIN  POC SARS CORONAVIRUS 2 AG -  ED  PREPARE RBC (CROSSMATCH)  TYPE AND SCREEN   Cbc completed at the cancer center prior to arrival significant for a BUN of 24 and a creatinine of 1.45, both of these numbers appear relatively stable.  Her WBC count is elevated at 13.1, also similar to lab results from 4 days ago.  Her hemoglobin is 7.8, down from 9.34 days ago.  Platelet count is 470.    EKG EKG Interpretation  Date/Time:  Friday September 05 2019 12:31:51 EST Ventricular Rate:  96 PR Interval:    QRS Duration: 99 QT Interval:  385 QTC Calculation: 487 R Axis:   5 Text Interpretation: Sinus rhythm Low voltage, extremity and precordial leads Abnormal R-wave progression, late transition Nonspecific T abnormalities, inferior leads Borderline prolonged QT interval Since last tracing QT has lengthened and low voltage chest leads Otherwise no significant change Confirmed by Daleen Bo [phone]) on 09/05/2019 12:55:23 PM   Radiology DG Chest 2 View  Result Date: 09/05/2019 CLINICAL DATA:  Low oxygen saturation EXAM: CHEST - 2 VIEW COMPARISON:  09/01/2019 FINDINGS: Bilateral diffuse interstitial thickening. No focal consolidation, pleural effusion or pneumothorax. Stable cardiomediastinal silhouette. No aggressive osseous lesion. IMPRESSION: Bilateral diffuse interstitial thickening which may reflect interstitial edema versus multilobar pneumonia. Electronically Signed   By: Kathreen Devoid   On: 09/05/2019 13:35   ECHOCARDIOGRAM COMPLETE  Result Date: 09/05/2019    ECHOCARDIOGRAM REPORT   Patient Name:   Carol Bradley Date of Exam: 09/05/2019 Medical Rec #:  [id number]    Height:       67.0 in Accession #:    [id number]   Weight:       165.3 lb Date of  Birth:  Aug 06, 1940    BSA:          1.865 m Patient Age:    27 years     BP:           108/55 mmHg Patient Gender: F            HR:           103 bpm. Exam Location:  [facility] Procedure: 2D Echo STAT ECHO Indications:    Congestive Heart Failure 428.0 / I50.9  History:        Patient has prior history of Echocardiogram examinations, most                 recent 01/09/2019. CAD, Signs/Symptoms:Dyspnea; Risk                 Factors:Hypertension, Dyslipidemia and Former Smoker. Chronic                 renal disease.  Sonographer:    Leavy Cella RDCS (AE) Referring Phys: [id number] JULIE IDOL IMPRESSIONS  1. Left ventricular ejection fraction, by estimation, is 55 to 60%. The left ventricle has normal function. The left ventricle has no regional wall motion abnormalities. There is mild concentric left ventricular hypertrophy. Left ventricular diastolic parameters are consistent with Grade I diastolic dysfunction (impaired relaxation).  2. Right ventricular systolic function is normal. The right ventricular size is mildly enlarged. There is mildly elevated pulmonary artery systolic pressure.  3. Right atrial size was mildly dilated.  4. The mitral valve is degenerative. Moderate mitral valve regurgitation.  5. Tricuspid valve regurgitation is mild to moderate.  6. The aortic valve is tricuspid. Aortic valve regurgitation is mild. Mild aortic valve sclerosis is present, with no evidence of aortic  valve stenosis.  7. The inferior vena cava is normal in size with greater than 50% respiratory variability, suggesting right atrial pressure of 3 mmHg. FINDINGS  Left Ventricle: Left ventricular ejection fraction, by estimation, is 55 to 60%. The left ventricle has normal function. The left ventricle has no regional wall motion abnormalities. The left ventricular internal cavity size was normal in size. There is  mild concentric left ventricular hypertrophy. Left ventricular diastolic parameters are consistent with Grade I diastolic  dysfunction (impaired relaxation). Indeterminate filling pressures. Right Ventricle: The right ventricular size is mildly enlarged. No increase in right ventricular wall thickness. Right ventricular systolic function is normal. There is mildly elevated pulmonary artery systolic pressure. The tricuspid regurgitant velocity is 2.64 m/s, and with an assumed right atrial pressure of 10 mmHg, the estimated right ventricular systolic pressure is 61.6 mmHg. Left Atrium: Left atrial size was normal in size. Right Atrium: Right atrial size was mildly dilated. Pericardium: There is no evidence of pericardial effusion. Mitral Valve: The mitral valve is degenerative in appearance. There is mild thickening of the mitral valve leaflet(s). Mild mitral annular calcification. Moderate mitral valve regurgitation. Tricuspid Valve: The tricuspid valve is grossly normal. Tricuspid valve regurgitation is mild to moderate. Aortic Valve: The aortic valve is tricuspid. . There is mild thickening of the aortic valve. Aortic valve regurgitation is mild. Mild aortic valve sclerosis is present, with no evidence of aortic valve stenosis. Mild aortic valve annular calcification. There is mild thickening of the aortic valve. Pulmonic Valve: The pulmonic valve was grossly normal. Pulmonic valve regurgitation is not visualized. Aorta: The aortic root is normal in size and structure. Venous: The inferior vena cava is normal in size with greater than 50% respiratory variability, suggesting right atrial pressure of 3 mmHg. IAS/Shunts: The interatrial septum was not well visualized.  LEFT VENTRICLE PLAX 2D LVIDd:         4.78 cm  Diastology LVIDs:         4.12 cm  LV e' lateral:   6.31 cm/s LV PW:         1.21 cm  LV E/e' lateral: 11.7 LV IVS:        1.10 cm  LV e' medial:    5.11 cm/s LVOT diam:     2.00 cm  LV E/e' medial:  14.4 LVOT Area:     3.14 cm  RIGHT VENTRICLE RV S prime:     15.20 cm/s TAPSE (M-mode): 1.8 cm LEFT ATRIUM             Index        RIGHT ATRIUM           Index LA diam:        3.90 cm 2.09 cm/m  RA Area:     17.90 cm LA Vol (A2C):   44.2 ml 23.70 ml/m RA Volume:   51.30 ml  27.50 ml/m LA Vol (A4C):   55.3 ml 29.65 ml/m LA Biplane Vol: 51.6 ml 27.67 ml/m   AORTA Ao Root diam: 2.90 cm MITRAL VALVE                TRICUSPID VALVE MV Area (PHT): 4.49 cm     TR Peak grad:   27.9 mmHg MV Decel Time: 169 msec     TR Vmax:        264.00 cm/s MR Peak grad:   83.2 mmHg MR Mean grad:   55.0 mmHg   SHUNTS MR Vmax:  456.00 cm/s Systemic Diam: 2.00 cm MR Vmean:       352.0 cm/s MR PISA:        1.01 cm MR PISA Radius: 0.40 cm MV E velocity: 73.70 cm/s MV A velocity: 89.30 cm/s MV E/A ratio:  0.83 Kate Sable MD Electronically signed by Kate Sable MD Signature Date/Time: 09/05/2019/2:33:25 PM    Final     Procedures Procedures (including critical care time)  Medications Ordered in ED Medications  cefTRIAXone (ROCEPHIN) 1 g in sodium chloride 0.9 % 100 mL IVPB (has no administration in time range)  azithromycin (ZITHROMAX) tablet 500 mg (has no administration in time range)  0.9 %  sodium chloride infusion (Manually program via Guardrails IV Fluids) (has no administration in time range)  0.9 %  sodium chloride infusion (has no administration in time range)  furosemide (LASIX) injection 40 mg (40 mg Intravenous Given 09/05/19 1354)    ED Course  I have reviewed the triage vital signs and the nursing notes.  Pertinent labs & imaging results that were available during my care of the patient were reviewed by me and considered in my medical decision making (see chart for details).    MDM Rules/Calculators/A&P                      Pt currently being treated for multiple myeloma presenting with increased weakness with n/v,  Increasing sob with anemia. Cancer center today for IV fuids, pulse ox 56% on room air, improved on nonrebreather here to mid 90's.  CXR and elevated bnp suggestive of CHF.  Cancer center requested  echo which was ordered with results above, normal EF 55-60%.    She is negative for antigen covid testing.  She was given lasix 40 mg IV prior to echo results given clinical picture.  She was also given rocephin and zithromax to tx for possible CAP.  Per cancer center recommendations, would like pt transfused 1 unit prbc which has been ordered.    Discussed with Dr. Nehemiah Settle - requested result of second troponin prior to decision to admit.  Also requested procalcitonin/ blood cx which were added.  Second troponin completed and is more elevated from 943 to 1177.  Discussed with Dr. Debara Pickett including hx, labs, echo.  Favors chf, agrees with diuresis and other medical management including improving anemia and renal insufficiency.  Needs these problems improved prior to cardiology consult/consideration of further cardiology workup.  Pt seen by Dr. Eulis Foster who also evaluated pt.  She will require admission given hypoxia/ CHF/ anemia/ renal insufficiency.  Antigen covid testing is currently pending.   Final Clinical Impression(s) / ED Diagnoses Final diagnoses:  Weakness  Hypoxia  Anemia, unspecified type  Acute on chronic congestive heart failure, unspecified heart failure type [facility])    Rx / DC Orders ED Discharge Orders    None       Landis Martins 09/05/19 Johnnye Lana    Daleen Bo, MD 09/08/19 [phone]

## 2019-09-05 NOTE — H&P (Signed)
History and Physical  Carol Bradley:[id number] DOB: 1940/09/12 DOA: 09/05/2019  Referring physician: Evalee Jefferson, Hershal Coria, ED physician PCP: [name], MD  Outpatient Specialists:  Patient Coming From: home   Chief Complaint: SOB, fatigue  HPI: Carol Bradley is a 79 y.o. 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  Patient presents with 7-day history of worsening weakness with shortness of breath, nausea, vomiting.  She was seen and evaluated at the cancer center, found to be hypoxic to 56% on room air and was found to be quite anemic at hemoglobin of 7.8.  Her symptoms are worse with ambulation and exertion and improved with rest.  Emergency Department Course: X x-ray shows pulmonary edema.  Troponin 943 and increased to 1177 on repeat.  Calcitonin 0.35.  White blood cell count 13.1.  Hemoglobin 7.8.  Covid negative  Review of Systems:   Pt denies any fevers, chills, nausea, vomiting, diarrhea, constipation, abdominal pain, headache, vision changes, lightheadedness, dizziness, melena, rectal bleeding.  Review of systems are otherwise negative  Past Medical History:  Diagnosis Date  . 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 CAD (coronary artery disease)   . COPD (chronic obstructive pulmonary disease) ([place])   . History of tobacco abuse   . Hyperlipidemia   . Hypertension   . 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 Multiple myeloma ([place])   . Vitamin B12 deficiency 04/15/2016   Overview:  Vitamin B12 level documented 155, January 2016 with the normal range being 211-924   Past Surgical History:  Procedure Laterality Date  . BACK SURGERY    . CARDIAC CATHETERIZATION  04/2011   right and left cath showing normal right heart pressures,but newly diagnosed coronary artery disease/drug eluting stent placed to RCA with  residual disease in the proximal RCA and LAD and ramus, normal LV function and 60-65% EF  . COLONOSCOPY N/A 09/30/2014   Procedure: COLONOSCOPY;  Surgeon: Rogene Houston, MD;  Location: [facility];  Service: Endoscopy;  Laterality: N/A;  225  . CORONARY ANGIOPLASTY WITH STENT PLACEMENT  04/2011   mid RCA: 3.0 X38 mm Promus DES. Residual 40% disease proximally  . NECK SURGERY     Social History:  reports that she quit smoking about 25 years ago. Her smoking use included cigarettes. She has a 75.00 pack-year smoking history. She has never used smokeless tobacco. She reports that she does not drink alcohol or use drugs. Patient lives at home  Allergies  Allergen Reactions  . Lisinopril Cough  . Plavix [Clopidogrel Bisulfate] Swelling    Family History  Problem Relation Age of Onset  . Heart failure Mother   . Cancer Father       Prior to Admission medications   Medication Sig Start Date End Date Taking? Authorizing Provider  acyclovir (ZOVIRAX) 200 MG capsule TAKE ONE CAPSULE BY MOUTH TWICE DAILY 06/16/19  Yes Derek Jack, MD  albuterol (VENTOLIN HFA) 108 (90 Base) MCG/ACT inhaler Inhale 2 puffs into the lungs every 6 (six) hours as needed for wheezing or shortness of breath. 07/23/19  Yes Derek Jack, MD  aspirin EC 81 MG tablet Take 1 tablet (81 mg total) by mouth daily. 01/09/17  Yes Martinique, Peter M, MD  atorvastatin (LIPITOR) 10 MG tablet TAKE ONE TABLET BY MOUTH DAILY 12/09/18  Yes Martinique, Peter M, MD  Calcium Carbonate-Vit D-Min (CALCIUM 600+D PLUS  MINERALS) 600-400 MG-UNIT CHEW Chew 1 tablet by mouth 3 (three) times daily.    Yes [provider]  cyanocobalamin (,VITAMIN B-12,) 1000 MCG/ML injection Inject 1,000 mcg into the skin every 30 (thirty) days.    Yes [provider]  daratumumab-hyaluronidase-fihj (DARZALEX FASPRO) 1800-30000 MG-UT/15ML SOLN Inject 1,800 mg into the skin. Weekly weeks 1-8; every 2 weeks weeks 9-24; every 4 weeks after week  24 07/28/19  Yes [provider]  diltiazem (CARDIZEM CD) 240 MG 24 hr capsule Take 1 capsule (240 mg total) by mouth daily. 01/17/19  Yes Martinique, Peter M, MD  fluticasone [facility]) 50 MCG/ACT nasal spray Place 2 sprays into both nostrils as needed.    Yes [provider]  levofloxacin (LEVAQUIN) 250 MG tablet Take 1 tablet (250 mg total) by mouth daily. Take tablets today then one tablet daily starting tomorrow 09/02/19  Yes Derek Jack, MD  magnesium oxide (MAG-OX) 400 (241.3 Mg) MG tablet Take 1 tablet by mouth 2 (two) times daily. 04/10/19  Yes [provider]  metoprolol tartrate (LOPRESSOR) 25 MG tablet Take 1.5 tablets (37.5 mg total) by mouth 2 (two) times daily. 06/13/19  Yes Martinique, Peter M, MD  ondansetron (ZOFRAN) 8 MG tablet Take 1 tablet (8 mg total) by mouth every 8 (eight) hours as needed for nausea or vomiting. 09/01/19  Yes Derek Jack, MD  pantoprazole (PROTONIX) 40 MG tablet Take 40 mg by mouth as needed.  09/19/18  Yes [provider]  pomalidomide (POMALYST) 2 MG capsule Take 1 capsule (2 mg total) by mouth daily. Take with water on days 1-21. Repeat every 28 days. 08/06/19  Yes Derek Jack, MD  potassium chloride (K-DUR) 10 MEQ tablet TAKE ONE TABLET BY MOUTH THREE TIMES DAILY. 03/20/19  Yes Lockamy, Randi L, NP-C  umeclidinium-vilanterol (ANORO ELLIPTA) 62.5-25 MCG/INH AEPB Inhale 1 puff into the lungs daily. 03/25/19  Yes Derek Jack, MD  Zoledronic Acid 4 MG SOLR Inject into the vein.   Yes [provider]  dexamethasone (DECADRON) 2 MG tablet Take five tablets ('10mg'$ ) by mouth weekly. Patient taking differently: Take five tablets ('10mg'$ ) by mouth bi-weekly. 11/13/18   Derek Jack, MD  diazepam (VALIUM) 5 MG tablet TAKE ONE TABLET BY MOUTH EVERY 6 HOURS AS NEEDED FOR ANXIETY Patient not taking: Reported on 09/01/2019 10/04/18   Derek Jack, MD  furosemide (LASIX) 20 MG tablet Take 1 tablet (20  mg total) by mouth daily as needed (as needed for ankle swelling). Patient not taking: Reported on 09/05/2019 04/07/19   Martinique, Peter M, MD  guaiFENesin (MUCINEX) 600 MG 12 hr tablet Take 600 mg 2 (two) times daily as needed by mouth for cough or to loosen phlegm.    [provider]  montelukast (SINGULAIR) 10 MG tablet Take 1 tablet three days prior to your first treatment and the day of treatment. Then take one tablet on the day of treatment there after. 07/23/19   Derek Jack, MD  polyethylene glycol powder (GLYCOLAX/MIRALAX) 17 GM/SCOOP powder Take by mouth as needed.     [provider]    Physical Exam: BP (!) 115/55   Pulse (!) 104   Temp 98.3 F (36.8 C)   Resp (!) 22   Wt 75 kg   SpO2 96%   BMI 25.90 kg/m   . General: Elderly female. Awake and alert and oriented x3. No acute cardiopulmonary distress.  Marland Kitchen HEENT: Normocephalic atraumatic.  Right and left ears normal in appearance.  Pupils equal, round,  reactive to light. Extraocular muscles are intact. Sclerae anicteric and noninjected.  Moist mucosal membranes. No mucosal lesions.  . Neck: Neck supple without lymphadenopathy. No carotid bruits. No masses palpated.  . Cardiovascular: Regular rate with normal S1-S2 sounds. No murmurs, rubs, gallops auscultated.  Increased JVD.  No edema . Respiratory: Rales in bases bilaterally.  No accessory muscle use. . Abdomen: Soft, nontender, nondistended. Active bowel sounds. No masses or hepatosplenomegaly  . Skin: No rashes, lesions, or ulcerations.  Dry, warm to touch. 2+ dorsalis pedis and radial pulses. . Musculoskeletal: No calf or leg pain. All major joints not erythematous nontender.  No upper or lower joint deformation.  Good ROM.  No contractures  . Psychiatric: Intact judgment and insight. Pleasant and cooperative. . Neurologic: No focal neurological deficits. Strength is 5/5 and symmetric in upper and lower extremities.  Cranial nerves II through XII are  grossly intact.           Labs on Admission: I have personally reviewed following labs and imaging studies  CBC: Recent Labs  Lab 09/01/19 0944 09/05/19 1052  WBC 13.1* 13.1*  NEUTROABS 10.7* 11.2*  HGB 9.3* 7.8*  HCT 30.0* 26.7*  MCV 106.8* 112.7*  PLT 281 151*   Basic Metabolic Panel: Recent Labs  Lab 09/01/19 0944 09/02/19 1106 09/05/19 1052  NA 137 139 139  K 3.5 3.9 4.7  CL 102 106 108  CO2 23 22 21*  GLUCOSE 136* 145* 129*  BUN 19 24* 24*  CREATININE 1.40* 1.46* 1.45*  CALCIUM 8.0* 7.8* 7.8*   GFR: Estimated Creatinine Clearance: 33.8 mL/min (A) (by C-G formula based on SCr of 1.45 mg/dL (H)). Liver Function Tests: Recent Labs  Lab 09/01/19 0944 09/05/19 1052  AST 13* 21  ALT 16 13  ALKPHOS 66 77  BILITOT 0.7 0.6  PROT 6.0* 5.7*  ALBUMIN 2.6* 2.3*   No results for input(s): LIPASE, AMYLASE in the last 168 hours. No results for input(s): AMMONIA in the last 168 hours. Coagulation Profile: No results for input(s): INR, PROTIME in the last 168 hours. Cardiac Enzymes: No results for input(s): CKTOTAL, CKMB, CKMBINDEX, TROPONINI in the last 168 hours. BNP (last 3 results) No results for input(s): PROBNP in the last 8760 hours. HbA1C: No results for input(s): HGBA1C in the last 72 hours. CBG: No results for input(s): GLUCAP in the last 168 hours. Lipid Profile: No results for input(s): CHOL, HDL, LDLCALC, TRIG, CHOLHDL, LDLDIRECT in the last 72 hours. Thyroid Function Tests: No results for input(s): TSH, T4TOTAL, FREET4, T3FREE, THYROIDAB in the last 72 hours. Anemia Panel: No results for input(s): VITAMINB12, FOLATE, FERRITIN, TIBC, IRON, RETICCTPCT in the last 72 hours. Urine analysis:    Component Value Date/Time   COLORURINE STRAW (A) 05/25/2017 1408   APPEARANCEUR HAZY (A) 05/25/2017 1408   LABSPEC 1.006 05/25/2017 1408   PHURINE 6.0 05/25/2017 1408   GLUCOSEU NEGATIVE 05/25/2017 1408   HGBUR SMALL (A) 05/25/2017 1408   BILIRUBINUR  NEGATIVE 05/25/2017 1408   KETONESUR NEGATIVE 05/25/2017 1408   PROTEINUR NEGATIVE 05/25/2017 1408   NITRITE NEGATIVE 05/25/2017 1408   LEUKOCYTESUR NEGATIVE 05/25/2017 1408   Sepsis Labs: '@LABRCNTIP'$ (procalcitonin:4,lacticidven:4) ) Recent Results (from the past 240 hour(s))  Respiratory Panel by RT PCR (Flu A&B, Covid) - Nasopharyngeal Swab     Status: None   Collection Time: 09/05/19  2:57 PM   Specimen: Nasopharyngeal Swab  Result Value Ref Range Status   SARS Coronavirus 2 by RT PCR NEGATIVE NEGATIVE Final  Comment: (NOTE) SARS-CoV-2 target nucleic acids are NOT DETECTED. The SARS-CoV-2 RNA is generally detectable in upper respiratoy specimens during the acute phase of infection. The lowest concentration of SARS-CoV-2 viral copies this assay can detect is 131 copies/mL. A negative result does not preclude SARS-Cov-2 infection and should not be used as the sole basis for treatment or other patient management decisions. A negative result may occur with  improper specimen collection/handling, submission of specimen other than nasopharyngeal swab, presence of viral mutation(s) within the areas targeted by this assay, and inadequate number of viral copies (<131 copies/mL). A negative result must be combined with clinical observations, patient history, and epidemiological information. The expected result is Negative. Fact Sheet for Patients:  PinkCheek.be Fact Sheet for Healthcare Providers:  GravelBags.it This test is not yet ap proved or cleared by the [place] FDA and  has been authorized for detection and/or diagnosis of SARS-CoV-2 by FDA under an Emergency Use Authorization (EUA). This EUA will remain  in effect (meaning this test can be used) for the duration of the COVID-19 declaration under Section 564(b)(1) of the Act, 21 U.S.C. section 360bbb-3(b)(1), unless the authorization is terminated or revoked  sooner.    Influenza A by PCR NEGATIVE NEGATIVE Final   Influenza B by PCR NEGATIVE NEGATIVE Final    Comment: (NOTE) The Xpert Xpress SARS-CoV-2/FLU/RSV assay is intended as an aid in  the diagnosis of influenza from Nasopharyngeal swab specimens and  should not be used as a sole basis for treatment. Nasal washings and  aspirates are unacceptable for Xpert Xpress SARS-CoV-2/FLU/RSV  testing. Fact Sheet for Patients: PinkCheek.be Fact Sheet for Healthcare Providers: GravelBags.it This test is not yet approved or cleared by the [place] FDA and  has been authorized for detection and/or diagnosis of SARS-CoV-2 by  FDA under an Emergency Use Authorization (EUA). This EUA will remain  in effect (meaning this test can be used) for the duration of the  Covid-19 declaration under Section 564(b)(1) of the Act, 21  U.S.C. section 360bbb-3(b)(1), unless the authorization is  terminated or revoked. Performed at [facility], [address] [place], [place] [address]   Blood culture (routine x 2)     Status: None (Preliminary result)   Collection Time: 09/05/19  4:38 PM   Specimen: Blood  Result Value Ref Range Status   Specimen Description BLOOD RIGHT HAND  Final   Special Requests   Final    BOTTLES DRAWN AEROBIC AND ANAEROBIC Blood Culture adequate volume Performed at [facility], [address] [place], [place] [address]    Culture PENDING  Incomplete   Report Status PENDING  Incomplete  Blood culture (routine x 2)     Status: None (Preliminary result)   Collection Time: 09/05/19  4:39 PM   Specimen: Blood  Result Value Ref Range Status   Specimen Description LEFT ANTECUBITAL  Final   Special Requests   Final    BOTTLES DRAWN AEROBIC AND ANAEROBIC Blood Culture adequate volume Performed at [facility], [address] [place], [place] [address]    Culture PENDING  Incomplete   Report Status PENDING  Incomplete      Radiological Exams on Admission: DG Chest 2 View  Result Date: 09/05/2019 CLINICAL DATA:  Low oxygen saturation EXAM: CHEST - 2 VIEW COMPARISON:  09/01/2019 FINDINGS: Bilateral diffuse interstitial thickening. No focal consolidation, pleural effusion or pneumothorax. Stable cardiomediastinal silhouette. No aggressive osseous lesion. IMPRESSION: Bilateral diffuse interstitial thickening which may reflect interstitial edema versus multilobar pneumonia.  Electronically Signed   By: Kathreen Devoid   On: 09/05/2019 13:35   ECHOCARDIOGRAM COMPLETE  Result Date: 09/05/2019    ECHOCARDIOGRAM REPORT   Patient Name:   MAISA BEDINGFIELD Date of Exam: 09/05/2019 Medical Rec #:  [id number]    Height:       67.0 in Accession #:    [id number]   Weight:       165.3 lb Date of Birth:  [birth year]    BSA:          1.865 m Patient Age:    64 years     BP:           108/55 mmHg Patient Gender: F            HR:           103 bpm. Exam Location:  [facility] Procedure: 2D Echo STAT ECHO Indications:    Congestive Heart Failure 428.0 / I50.9  History:        Patient has prior history of Echocardiogram examinations, most                 recent 01/09/2019. CAD, Signs/Symptoms:Dyspnea; Risk                 Factors:Hypertension, Dyslipidemia and Former Smoker. Chronic                 renal disease.  Sonographer:    Leavy Cella RDCS (AE) Referring Phys: [id number] JULIE IDOL IMPRESSIONS  1. Left ventricular ejection fraction, by estimation, is 55 to 60%. The left ventricle has normal function. The left ventricle has no regional wall motion abnormalities. There is mild concentric left ventricular hypertrophy. Left ventricular diastolic parameters are consistent with Grade I diastolic dysfunction (impaired relaxation).  2. Right ventricular systolic function is normal. The right ventricular size is mildly enlarged. There is mildly elevated pulmonary artery systolic pressure.  3. Right atrial size was mildly dilated.  4. The mitral valve is  degenerative. Moderate mitral valve regurgitation.  5. Tricuspid valve regurgitation is mild to moderate.  6. The aortic valve is tricuspid. Aortic valve regurgitation is mild. Mild aortic valve sclerosis is present, with no evidence of aortic valve stenosis.  7. The inferior vena cava is normal in size with greater than 50% respiratory variability, suggesting right atrial pressure of 3 mmHg. FINDINGS  Left Ventricle: Left ventricular ejection fraction, by estimation, is 55 to 60%. The left ventricle has normal function. The left ventricle has no regional wall motion abnormalities. The left ventricular internal cavity size was normal in size. There is  mild concentric left ventricular hypertrophy. Left ventricular diastolic parameters are consistent with Grade I diastolic dysfunction (impaired relaxation). Indeterminate filling pressures. Right Ventricle: The right ventricular size is mildly enlarged. No increase in right ventricular wall thickness. Right ventricular systolic function is normal. There is mildly elevated pulmonary artery systolic pressure. The tricuspid regurgitant velocity is 2.64 m/s, and with an assumed right atrial pressure of 10 mmHg, the estimated right ventricular systolic pressure is 09.9 mmHg. Left Atrium: Left atrial size was normal in size. Right Atrium: Right atrial size was mildly dilated. Pericardium: There is no evidence of pericardial effusion. Mitral Valve: The mitral valve is degenerative in appearance. There is mild thickening of the mitral valve leaflet(s). Mild mitral annular calcification. Moderate mitral valve regurgitation. Tricuspid Valve: The tricuspid valve is grossly normal. Tricuspid valve regurgitation is mild to moderate. Aortic Valve: The aortic valve is tricuspid. . There is mild  thickening of the aortic valve. Aortic valve regurgitation is mild. Mild aortic valve sclerosis is present, with no evidence of aortic valve stenosis. Mild aortic valve annular calcification.  There is mild thickening of the aortic valve. Pulmonic Valve: The pulmonic valve was grossly normal. Pulmonic valve regurgitation is not visualized. Aorta: The aortic root is normal in size and structure. Venous: The inferior vena cava is normal in size with greater than 50% respiratory variability, suggesting right atrial pressure of 3 mmHg. IAS/Shunts: The interatrial septum was not well visualized.  LEFT VENTRICLE PLAX 2D LVIDd:         4.78 cm  Diastology LVIDs:         4.12 cm  LV e' lateral:   6.31 cm/s LV PW:         1.21 cm  LV E/e' lateral: 11.7 LV IVS:        1.10 cm  LV e' medial:    5.11 cm/s LVOT diam:     2.00 cm  LV E/e' medial:  14.4 LVOT Area:     3.14 cm  RIGHT VENTRICLE RV S prime:     15.20 cm/s TAPSE (M-mode): 1.8 cm LEFT ATRIUM             Index       RIGHT ATRIUM           Index LA diam:        3.90 cm 2.09 cm/m  RA Area:     17.90 cm LA Vol (A2C):   44.2 ml 23.70 ml/m RA Volume:   51.30 ml  27.50 ml/m LA Vol (A4C):   55.3 ml 29.65 ml/m LA Biplane Vol: 51.6 ml 27.67 ml/m   AORTA Ao Root diam: 2.90 cm MITRAL VALVE                TRICUSPID VALVE MV Area (PHT): 4.49 cm     TR Peak grad:   27.9 mmHg MV Decel Time: 169 msec     TR Vmax:        264.00 cm/s MR Peak grad:   83.2 mmHg MR Mean grad:   55.0 mmHg   SHUNTS MR Vmax:        456.00 cm/s Systemic Diam: 2.00 cm MR Vmean:       352.0 cm/s MR PISA:        1.01 cm MR PISA Radius: 0.40 cm MV E velocity: 73.70 cm/s MV A velocity: 89.30 cm/s MV E/A ratio:  0.83 Kate Sable MD Electronically signed by Kate Sable MD Signature Date/Time: 09/05/2019/2:33:25 PM    Final      Assessment/Plan: Principal Problem:   Symptomatic anemia Active Problems:   CAD (coronary artery disease)   Hypertension   Multiple myeloma (HCC)   Emphysema/COPD (HCC)   Anemia due to bone marrow failure (HCC)   Chronic diastolic congestive heart failure (HCC)   Chronic renal disease, stage 3, moderately decreased glomerular filtration rate between  30-59 mL/min/1.73 square meter   Elevated troponin   Community acquired pneumonia    This patient was discussed with the ED physician, including pertinent vitals, physical exam findings, labs, and imaging.  We also discussed care given by the ED provider.  1. Symptomatic anemia a. Transfused 2 units.  Lasix in between units 2. Acute on chronic heart failure Telemetry monitoring Strict I/O Daily Weights Diuresis: Lasix 60 mg IV Potassium: Prophylactically replace Echo cardiac exam tomorrow Repeat BMP tomorrow 3. Elevated troponin a. Cardiology consulted by ED.  Cardiologist felt that this was secondary to heart failure.  Heparin not recommended.  Recommended diuresis and reevaluation.   b. We will trend troponins 4. Community acquired pneumonia Procalcitonin elevated Antibiotics: Rocephin and azithromycin Robitussin Blood cultures drawn in the emergency department Sputum cultures CBC tomorrow 5. Stage III chronic kidney disease 6. Multiple myeloma 7. COPD a. No exacerbation 8. Hypertension 9. Coronary artery disease  DVT prophylaxis: Lovenox Consultants: None Code Status: Full code Family Communication: None Disposition Plan: Pending   Truett Mainland, DO

## 2019-09-05 NOTE — Progress Notes (Signed)
1130-patient came in today for labs, hydration fluids. Patient had not been feeling well this week. Patient states she could not breath while sitting up , applied O2 at 2 liters. Facial and periorbital swelling noted. Patient states she is not on steroids at this time, is not taking a fluid pill at this time.  Patient stated she has had Ensure this week, one a day and only water with meds since Wednesday. Vomiting after drinking tea and coke twice this week. Denies any dark tarry stools recently.   Peripheral IV obtained and NP to further evaluate   Will transfer patient to Emergency room per NP. Daughter in law at bedside.   Patient discharged from clinic to emergency room via wheelchair with family at her side. Vitals stable at this time.

## 2019-09-05 NOTE — Progress Notes (Unsigned)
bnp

## 2019-09-06 LAB — CBC
HCT: 24.8 % — ABNORMAL LOW (ref 36.0–46.0)
Hemoglobin: 7.7 g/dL — ABNORMAL LOW (ref 12.0–15.0)
MCH: 33.2 pg (ref 26.0–34.0)
MCHC: 31 g/dL (ref 30.0–36.0)
MCV: 106.9 fL — ABNORMAL HIGH (ref 80.0–100.0)
Platelets: 441 10*3/uL — ABNORMAL HIGH (ref 150–400)
RBC: 2.32 MIL/uL — ABNORMAL LOW (ref 3.87–5.11)
RDW: 20.3 % — ABNORMAL HIGH (ref 11.5–15.5)
WBC: 8.3 10*3/uL (ref 4.0–10.5)
nRBC: 0.5 % — ABNORMAL HIGH (ref 0.0–0.2)

## 2019-09-06 LAB — BASIC METABOLIC PANEL
Anion gap: 11 (ref 5–15)
BUN: 20 mg/dL (ref 8–23)
CO2: 24 mmol/L (ref 22–32)
Calcium: 7.4 mg/dL — ABNORMAL LOW (ref 8.9–10.3)
Chloride: 106 mmol/L (ref 98–111)
Creatinine, Ser: 1.84 mg/dL — ABNORMAL HIGH (ref 0.44–1.00)
GFR calc Af Amer: 30 mL/min — ABNORMAL LOW (ref >60)
GFR calc non Af Amer: 26 mL/min — ABNORMAL LOW (ref >60)
Glucose, Bld: 111 mg/dL — ABNORMAL HIGH (ref 70–99)
Potassium: 3.6 mmol/L (ref 3.5–5.1)
Sodium: 141 mmol/L (ref 135–145)

## 2019-09-06 LAB — TROPONIN I (HIGH SENSITIVITY)
Troponin I (High Sensitivity): 1335 ng/L (ref <18)
Troponin I (High Sensitivity): 1481 ng/L (ref <18)
Troponin I (High Sensitivity): 1161 ng/L (ref <18)

## 2019-09-06 MED ORDER — ENOXAPARIN SODIUM 30 MG/0.3ML ~~LOC~~ SOLN
30.0000 mg | SUBCUTANEOUS | Status: DC
  Administered 2019-09-07 – 2019-09-08 (×2): 30 mg via SUBCUTANEOUS
  Filled 2019-09-06 (×2): qty 0.3

## 2019-09-06 MED ORDER — DIPHENHYDRAMINE HCL 25 MG PO CAPS
25.0000 mg | ORAL_CAPSULE | Freq: Once | ORAL | Status: AC
  Administered 2019-09-06: 25 mg via ORAL
  Filled 2019-09-06: qty 1

## 2019-09-06 MED ORDER — ACETAMINOPHEN 500 MG PO TABS
1000.0000 mg | ORAL_TABLET | Freq: Once | ORAL | Status: AC
  Administered 2019-09-06: 1000 mg via ORAL
  Filled 2019-09-06: qty 2

## 2019-09-06 NOTE — Progress Notes (Signed)
PROGRESS NOTE    Carol Bradley  ATF:[id number] DOB: 03/30/1941 DOA: 09/05/2019 PCP: [name], MD     Brief Narrative:  Carol Bradley is a 79 y.o. 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  Patient presents with 7-day history of worsening weakness with shortness of breath, nausea, vomiting.  She was seen and evaluated at the cancer center, found to be hypoxic to 56% on room air and was found to be quite anemic with hemoglobin of 7.8.  Her symptoms are worse with ambulation and exertion and improved with rest.   New events last 24 hours / Subjective: Continues to feel weak. No chest pain, worsening SOB, peripheral edema. Son reports that patient did not take lasix recently.   Assessment & Plan:   Principal Problem:   Symptomatic anemia Active Problems:   CAD (coronary artery disease)   Hypertension   Multiple myeloma (HCC)   Emphysema/COPD (HCC)   Anemia due to bone marrow failure (HCC)   Chronic diastolic congestive heart failure (HCC)   Chronic renal disease, stage 3, moderately decreased glomerular filtration rate between 30-59 mL/min/1.73 square meter   Elevated troponin   Community acquired pneumonia    Symptomatic anemia secondary to MM -Baseline hemoglobin appears to be around 9-10 -Transfuse 1u pRBC today, awaiting blood bank for crossmatch -Trend CBC   Acute hypoxemic respiratory failure -Currently requiring 5 L nasal cannula O2, continue to wean as able back to room air  Acute on chronic diastolic heart failure -Echocardiogram 2/26: EF 55 to 60%, with grade 1 diastolic dysfunction -BNP 1489 -Continue IV Lasix -Continue to monitor strict I's and O's, daily weight  CAP -Procalcitonin 0.35 -Influenza and COVID-19 negative -Chest x-ray: Bilateral diffuse interstitial thickening which may reflect interstitial edema versus  multilobar pneumonia -Blood cultures pending  -Rocephin, azithromycin   AKI on CKD stage 3a -Baseline creatinine around 1.3-1.4 -Monitor closely while on Lasix  Elevated troponin -EDP discussed with Cardiology at time of admission, thought it was secondary to HF and not ACS. Did not recommend IV heparin at this time -Peak at 1481 and trended down   -Denies CP on exam  -Echocardiogram did not reveal regional wall motion abnormalities  MM -Followed by Dr. Delton Coombes  CAD status post PCI/DES in 2012 -Continue aspirin, Lipitor, metoprolol  Essential hypertension -Continue Cardizem, Lopressor    DVT prophylaxis: Lovenox Code Status: Full  Family Communication: Discussed with son over the phone  Disposition Plan:  . Patient is from home prior to admission. . Currently in-hospital treatment needed due to respiratory failure, anemia, CHF, AKI requiring IV lasix and close monitoring of O2 and Cr. . Suspect patient will discharge home in 2-3 days.    Consultants:   None  Procedures:   None   Antimicrobials:  Anti-infectives (From admission, onward)   Start     Dose/Rate Route Frequency Ordered Stop   09/06/19 1030  azithromycin (ZITHROMAX) 500 mg in sodium chloride 0.9 % 250 mL IVPB     500 mg 250 mL/hr over 60 Minutes Intravenous Every 24 hours 09/05/19 2140 09/11/19 1029   09/06/19 1000  cefTRIAXone (ROCEPHIN) 2 g in sodium chloride 0.9 % 100 mL IVPB     2 g 200 mL/hr over 30 Minutes Intravenous Every 24 hours 09/05/19 2140 09/11/19 0959   09/05/19 2200  acyclovir (ZOVIRAX) 200 MG capsule 200 mg     200  mg Oral 2 times daily 09/05/19 2140     09/05/19 1515  cefTRIAXone (ROCEPHIN) 1 g in sodium chloride 0.9 % 100 mL IVPB     1 g 200 mL/hr over 30 Minutes Intravenous  Once 09/05/19 1458 09/05/19 1608   09/05/19 1500  azithromycin (ZITHROMAX) tablet 500 mg     500 mg Oral  Once 09/05/19 1458 09/05/19 1538        Objective: Vitals:   09/06/19 0443 09/06/19 0742  09/06/19 0823 09/06/19 1126  BP: (!) 111/53 (!) 139/107  (!) 103/54  Pulse: (!) 103 100  100  Resp: '20 18  18  '$ Temp: 98.3 F (36.8 C) 97.7 F (36.5 C)  98.2 F (36.8 C)  TempSrc: Oral Oral  Oral  SpO2: 90% (!) 89% 94% 93%  Weight: 76.5 kg     Height:        Intake/Output Summary (Last 24 hours) at 09/06/2019 1139 Last data filed at 09/06/2019 0445 Gross per 24 hour  Intake 240 ml  Output 1425 ml  Net -1185 ml   Filed Weights   09/05/19 1228 09/05/19 2124 09/06/19 0443  Weight: 75 kg 76.5 kg 76.5 kg    Examination:  General exam: Appears calm and comfortable  Respiratory system: Clear to auscultation with some mild crackles peripherally. Respiratory effort normal. No respiratory distress. No conversational dyspnea. On [place] O2  Cardiovascular system: S1 & S2 heard, RRR. No murmurs. No pedal edema. Gastrointestinal system: Abdomen is nondistended, soft and nontender. Normal bowel sounds heard. Central nervous system: Alert and oriented. No focal neurological deficits. Speech clear.  Extremities: Symmetric in appearance  Skin: No rashes, lesions or ulcers on exposed skin  Psychiatry: Judgement and insight appear normal. Mood & affect appropriate.   Data Reviewed: I have personally reviewed following labs and imaging studies  CBC: Recent Labs  Lab 09/01/19 0944 09/05/19 1052 09/06/19 0805  WBC 13.1* 13.1* 8.3  NEUTROABS 10.7* 11.2*  --   HGB 9.3* 7.8* 7.7*  HCT 30.0* 26.7* 24.8*  MCV 106.8* 112.7* 106.9*  PLT 281 470* 086*   Basic Metabolic Panel: Recent Labs  Lab 09/01/19 0944 09/02/19 1106 09/05/19 1052 09/06/19 0342  NA 137 139 139 141  K 3.5 3.9 4.7 3.6  CL 102 106 108 106  CO2 23 22 21* 24  GLUCOSE 136* 145* 129* 111*  BUN 19 24* 24* 20  CREATININE 1.40* 1.46* 1.45* 1.84*  CALCIUM 8.0* 7.8* 7.8* 7.4*   GFR: Estimated Creatinine Clearance: 26.3 mL/min (A) (by C-G formula based on SCr of 1.84 mg/dL (H)). Liver Function Tests: Recent Labs  Lab  09/01/19 0944 09/05/19 1052  AST 13* 21  ALT 16 13  ALKPHOS 66 77  BILITOT 0.7 0.6  PROT 6.0* 5.7*  ALBUMIN 2.6* 2.3*   No results for input(s): LIPASE, AMYLASE in the last 168 hours. No results for input(s): AMMONIA in the last 168 hours. Coagulation Profile: No results for input(s): INR, PROTIME in the last 168 hours. Cardiac Enzymes: No results for input(s): CKTOTAL, CKMB, CKMBINDEX, TROPONINI in the last 168 hours. BNP (last 3 results) No results for input(s): PROBNP in the last 8760 hours. HbA1C: No results for input(s): HGBA1C in the last 72 hours. CBG: No results for input(s): GLUCAP in the last 168 hours. Lipid Profile: No results for input(s): CHOL, HDL, LDLCALC, TRIG, CHOLHDL, LDLDIRECT in the last 72 hours. Thyroid Function Tests: No results for input(s): TSH, T4TOTAL, FREET4, T3FREE, THYROIDAB in the last 72 hours. Anemia Panel:  No results for input(s): VITAMINB12, FOLATE, FERRITIN, TIBC, IRON, RETICCTPCT in the last 72 hours. Sepsis Labs: Recent Labs  Lab 09/05/19 1509  PROCALCITON 0.35    Recent Results (from the past 240 hour(s))  Respiratory Panel by RT PCR (Flu A&B, Covid) - Nasopharyngeal Swab     Status: None   Collection Time: 09/05/19  2:57 PM   Specimen: Nasopharyngeal Swab  Result Value Ref Range Status   SARS Coronavirus 2 by RT PCR NEGATIVE NEGATIVE Final    Comment: (NOTE) SARS-CoV-2 target nucleic acids are NOT DETECTED. The SARS-CoV-2 RNA is generally detectable in upper respiratoy specimens during the acute phase of infection. The lowest concentration of SARS-CoV-2 viral copies this assay can detect is 131 copies/mL. A negative result does not preclude SARS-Cov-2 infection and should not be used as the sole basis for treatment or other patient management decisions. A negative result may occur with  improper specimen collection/handling, submission of specimen other than nasopharyngeal swab, presence of viral mutation(s) within the areas  targeted by this assay, and inadequate number of viral copies (<131 copies/mL). A negative result must be combined with clinical observations, patient history, and epidemiological information. The expected result is Negative. Fact Sheet for Patients:  PinkCheek.be Fact Sheet for Healthcare Providers:  GravelBags.it This test is not yet ap proved or cleared by the [place] FDA and  has been authorized for detection and/or diagnosis of SARS-CoV-2 by FDA under an Emergency Use Authorization (EUA). This EUA will remain  in effect (meaning this test can be used) for the duration of the COVID-19 declaration under Section 564(b)(1) of the Act, 21 U.S.C. section 360bbb-3(b)(1), unless the authorization is terminated or revoked sooner.    Influenza A by PCR NEGATIVE NEGATIVE Final   Influenza B by PCR NEGATIVE NEGATIVE Final    Comment: (NOTE) The Xpert Xpress SARS-CoV-2/FLU/RSV assay is intended as an aid in  the diagnosis of influenza from Nasopharyngeal swab specimens and  should not be used as a sole basis for treatment. Nasal washings and  aspirates are unacceptable for Xpert Xpress SARS-CoV-2/FLU/RSV  testing. Fact Sheet for Patients: PinkCheek.be Fact Sheet for Healthcare Providers: GravelBags.it This test is not yet approved or cleared by the [place] FDA and  has been authorized for detection and/or diagnosis of SARS-CoV-2 by  FDA under an Emergency Use Authorization (EUA). This EUA will remain  in effect (meaning this test can be used) for the duration of the  Covid-19 declaration under Section 564(b)(1) of the Act, 21  U.S.C. section 360bbb-3(b)(1), unless the authorization is  terminated or revoked. Performed at [facility], [address], [place], [place] [address]   Blood culture (routine x 2)     Status: None (Preliminary result)   Collection Time:  09/05/19  4:38 PM   Specimen: BLOOD RIGHT HAND  Result Value Ref Range Status   Specimen Description BLOOD RIGHT HAND  Final   Special Requests   Final    BOTTLES DRAWN AEROBIC AND ANAEROBIC Blood Culture adequate volume   Culture   Final    NO GROWTH < 24 HOURS Performed at [facility], [address], [place], [place] [address]    Report Status PENDING  Incomplete  Blood culture (routine x 2)     Status: None (Preliminary result)   Collection Time: 09/05/19  4:39 PM   Specimen: Left Antecubital; Blood  Result Value Ref Range Status   Specimen Description LEFT ANTECUBITAL  Final   Special Requests   Final  BOTTLES DRAWN AEROBIC AND ANAEROBIC Blood Culture adequate volume   Culture   Final    NO GROWTH < 24 HOURS Performed at [facility], [address] [place], [place] [address]    Report Status PENDING  Incomplete      Radiology Studies: DG Chest 2 View  Result Date: 09/05/2019 CLINICAL DATA:  Low oxygen saturation EXAM: CHEST - 2 VIEW COMPARISON:  09/01/2019 FINDINGS: Bilateral diffuse interstitial thickening. No focal consolidation, pleural effusion or pneumothorax. Stable cardiomediastinal silhouette. No aggressive osseous lesion. IMPRESSION: Bilateral diffuse interstitial thickening which may reflect interstitial edema versus multilobar pneumonia. Electronically Signed   By: Kathreen Devoid   On: 09/05/2019 13:35   ECHOCARDIOGRAM COMPLETE  Result Date: 09/05/2019    ECHOCARDIOGRAM REPORT   Patient Name:   Carol Bradley Date of Exam: 09/05/2019 Medical Rec #:  [id number]    Height:       67.0 in Accession #:    [id number]   Weight:       165.3 lb Date of Birth:  22-Nov-1940    BSA:          1.865 m Patient Age:    89 years     BP:           108/55 mmHg Patient Gender: F            HR:           103 bpm. Exam Location:  [facility] Procedure: 2D Echo STAT ECHO Indications:    Congestive Heart Failure 428.0 / I50.9  History:        Patient has prior history of Echocardiogram  examinations, most                 recent 01/09/2019. CAD, Signs/Symptoms:Dyspnea; Risk                 Factors:Hypertension, Dyslipidemia and Former Smoker. Chronic                 renal disease.  Sonographer:    Leavy Cella RDCS (AE) Referring Phys: [id number] JULIE IDOL IMPRESSIONS  1. Left ventricular ejection fraction, by estimation, is 55 to 60%. The left ventricle has normal function. The left ventricle has no regional wall motion abnormalities. There is mild concentric left ventricular hypertrophy. Left ventricular diastolic parameters are consistent with Grade I diastolic dysfunction (impaired relaxation).  2. Right ventricular systolic function is normal. The right ventricular size is mildly enlarged. There is mildly elevated pulmonary artery systolic pressure.  3. Right atrial size was mildly dilated.  4. The mitral valve is degenerative. Moderate mitral valve regurgitation.  5. Tricuspid valve regurgitation is mild to moderate.  6. The aortic valve is tricuspid. Aortic valve regurgitation is mild. Mild aortic valve sclerosis is present, with no evidence of aortic valve stenosis.  7. The inferior vena cava is normal in size with greater than 50% respiratory variability, suggesting right atrial pressure of 3 mmHg. FINDINGS  Left Ventricle: Left ventricular ejection fraction, by estimation, is 55 to 60%. The left ventricle has normal function. The left ventricle has no regional wall motion abnormalities. The left ventricular internal cavity size was normal in size. There is  mild concentric left ventricular hypertrophy. Left ventricular diastolic parameters are consistent with Grade I diastolic dysfunction (impaired relaxation). Indeterminate filling pressures. Right Ventricle: The right ventricular size is mildly enlarged. No increase in right ventricular wall thickness. Right ventricular systolic function is normal. There is mildly elevated pulmonary artery systolic pressure.  The tricuspid regurgitant  velocity is 2.64 m/s, and with an assumed right atrial pressure of 10 mmHg, the estimated right ventricular systolic pressure is 07.8 mmHg. Left Atrium: Left atrial size was normal in size. Right Atrium: Right atrial size was mildly dilated. Pericardium: There is no evidence of pericardial effusion. Mitral Valve: The mitral valve is degenerative in appearance. There is mild thickening of the mitral valve leaflet(s). Mild mitral annular calcification. Moderate mitral valve regurgitation. Tricuspid Valve: The tricuspid valve is grossly normal. Tricuspid valve regurgitation is mild to moderate. Aortic Valve: The aortic valve is tricuspid. . There is mild thickening of the aortic valve. Aortic valve regurgitation is mild. Mild aortic valve sclerosis is present, with no evidence of aortic valve stenosis. Mild aortic valve annular calcification. There is mild thickening of the aortic valve. Pulmonic Valve: The pulmonic valve was grossly normal. Pulmonic valve regurgitation is not visualized. Aorta: The aortic root is normal in size and structure. Venous: The inferior vena cava is normal in size with greater than 50% respiratory variability, suggesting right atrial pressure of 3 mmHg. IAS/Shunts: The interatrial septum was not well visualized.  LEFT VENTRICLE PLAX 2D LVIDd:         4.78 cm  Diastology LVIDs:         4.12 cm  LV e' lateral:   6.31 cm/s LV PW:         1.21 cm  LV E/e' lateral: 11.7 LV IVS:        1.10 cm  LV e' medial:    5.11 cm/s LVOT diam:     2.00 cm  LV E/e' medial:  14.4 LVOT Area:     3.14 cm  RIGHT VENTRICLE RV S prime:     15.20 cm/s TAPSE (M-mode): 1.8 cm LEFT ATRIUM             Index       RIGHT ATRIUM           Index LA diam:        3.90 cm 2.09 cm/m  RA Area:     17.90 cm LA Vol (A2C):   44.2 ml 23.70 ml/m RA Volume:   51.30 ml  27.50 ml/m LA Vol (A4C):   55.3 ml 29.65 ml/m LA Biplane Vol: 51.6 ml 27.67 ml/m   AORTA Ao Root diam: 2.90 cm MITRAL VALVE                TRICUSPID VALVE MV Area  (PHT): 4.49 cm     TR Peak grad:   27.9 mmHg MV Decel Time: 169 msec     TR Vmax:        264.00 cm/s MR Peak grad:   83.2 mmHg MR Mean grad:   55.0 mmHg   SHUNTS MR Vmax:        456.00 cm/s Systemic Diam: 2.00 cm MR Vmean:       352.0 cm/s MR PISA:        1.01 cm MR PISA Radius: 0.40 cm MV E velocity: 73.70 cm/s MV A velocity: 89.30 cm/s MV E/A ratio:  0.83 Kate Sable MD Electronically signed by Kate Sable MD Signature Date/Time: 09/05/2019/2:33:25 PM    Final       Scheduled Meds: . sodium chloride   Intravenous Once  . sodium chloride   Intravenous Once  . sodium chloride   Intravenous Once  . acyclovir  200 mg Oral BID  . aspirin EC  81 mg Oral Daily  .  atorvastatin  10 mg Oral q1800  . diltiazem  240 mg Oral Daily  . enoxaparin (LOVENOX) injection  40 mg Subcutaneous Q24H  . fluticasone  2 spray Each Nare Daily  . furosemide  60 mg Intravenous BID  . metoprolol tartrate  37.5 mg Oral BID  . montelukast  10 mg Oral QHS  . pantoprazole  40 mg Oral Daily  . potassium chloride  10 mEq Oral TID  . sodium chloride flush  3 mL Intravenous Q12H  . umeclidinium-vilanterol  1 puff Inhalation Daily   Continuous Infusions: . sodium chloride    . azithromycin 500 mg (09/06/19 1139)  . cefTRIAXone (ROCEPHIN)  IV 2 g (09/06/19 1009)     LOS: 1 day      Time spent: 35 minutes   Dessa Phi, DO [facility] 09/06/2019, 11:39 AM   Available via Epic secure chat 7am-7pm After these hours, please refer to coverage provider listed on [email or web address]

## 2019-09-06 NOTE — Progress Notes (Signed)
   09/05/19 2124  Vitals  Temp 97.9 F (36.6 C)  Temp Source Oral  BP (!) 113/56  MAP (mmHg) 74  BP Location Left Arm  BP Method Automatic  Patient Position (if appropriate) Lying  Pulse Rate 100  Pulse Rate Source Monitor  Resp 20  Oxygen Therapy  SpO2 96 %  O2 Device HFNC  Height and Weight  Weight 76.5 kg  Type of Scale Used Bed  Type of Weight Actual  MEWS Score  MEWS Temp 0  MEWS Systolic 0  MEWS Pulse 0  MEWS RR 0  MEWS LOC 0  MEWS Score 0  MEWS Score Color Green  Admitted pt to rm 3E28 from AP. Oriented to room, call bell placed within reach. Pt is alert and oriented x 4, placed on cardiac monitor, CCMD made aware. Denied any pain at this time.

## 2019-09-06 NOTE — Progress Notes (Signed)
     09/06/19 0345  Provider Notification  Provider Name/Title C. Bodenheimer NP  Date Provider Notified 09/06/19  Time Provider Notified 0345  Notification Type Page  Notification Reason Other (Comment) (troponin HS=1481)  Response No new orders

## 2019-09-06 NOTE — Progress Notes (Signed)
This note also relates to the following rows which could not be included: ECG Heart Rate - Cannot attach notes to unvalidated device data    09/06/19 0040  Provider Notification  Provider Name/Title C. Bodenheimer NP  Date Provider Notified 09/06/19  Time Provider Notified 0040  Notification Type Page  Notification Reason Other (Comment) (Troponin HS=1335)  Response Other (Comment) (awaiting callback)

## 2019-09-07 LAB — BASIC METABOLIC PANEL
Anion gap: 11 (ref 5–15)
BUN: 18 mg/dL (ref 8–23)
CO2: 27 mmol/L (ref 22–32)
Calcium: 7.6 mg/dL — ABNORMAL LOW (ref 8.9–10.3)
Chloride: 104 mmol/L (ref 98–111)
Creatinine, Ser: 1.64 mg/dL — ABNORMAL HIGH (ref 0.44–1.00)
GFR calc Af Amer: 34 mL/min — ABNORMAL LOW (ref >60)
GFR calc non Af Amer: 30 mL/min — ABNORMAL LOW (ref >60)
Glucose, Bld: 125 mg/dL — ABNORMAL HIGH (ref 70–99)
Potassium: 3.5 mmol/L (ref 3.5–5.1)
Sodium: 142 mmol/L (ref 135–145)

## 2019-09-07 LAB — CBC
HCT: 29.9 % — ABNORMAL LOW (ref 36.0–46.0)
Hemoglobin: 9.4 g/dL — ABNORMAL LOW (ref 12.0–15.0)
MCH: 31.1 pg (ref 26.0–34.0)
MCHC: 31.4 g/dL (ref 30.0–36.0)
MCV: 99 fL (ref 80.0–100.0)
Platelets: 436 10*3/uL — ABNORMAL HIGH (ref 150–400)
RBC: 3.02 MIL/uL — ABNORMAL LOW (ref 3.87–5.11)
RDW: 23.9 % — ABNORMAL HIGH (ref 11.5–15.5)
WBC: 6.9 10*3/uL (ref 4.0–10.5)
nRBC: 0.4 % — ABNORMAL HIGH (ref 0.0–0.2)

## 2019-09-07 NOTE — Progress Notes (Signed)
PROGRESS NOTE    Carol Bradley  VCB:[id number] DOB: [birth year] DOA: 09/05/2019 PCP: [name], MD     Brief Narrative:  Carol Bradley is a 79 y.o. 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  Patient presents with 7-day history of worsening weakness with shortness of breath, nausea, vomiting.  She was seen and evaluated at the cancer center, found to be hypoxic to 56% on room air and was found to be quite anemic with hemoglobin of 7.8.  Her symptoms are worse with ambulation and exertion and improved with rest.   New events last 24 hours / Subjective: Weakness has improved.  No worsening shortness of breath.  Remains on 4 L nasal cannula O2 this morning.  Encouraged to get out of bed today.  Assessment & Plan:   Principal Problem:   Symptomatic anemia Active Problems:   CAD (coronary artery disease)   Hypertension   Multiple myeloma (HCC)   Emphysema/COPD (HCC)   Anemia due to bone marrow failure (HCC)   Chronic diastolic congestive heart failure (HCC)   Chronic renal disease, stage 3, moderately decreased glomerular filtration rate between 30-59 mL/min/1.73 square meter   Elevated troponin   Community acquired pneumonia    Symptomatic anemia secondary to MM -Baseline hemoglobin appears to be around 9-10 -Status post 1 unit packed red blood cell 2/27 with improvement in hemoglobin 9.4 -Trend CBC   Acute hypoxemic respiratory failure -Currently requiring 4 L nasal cannula O2, continue to wean as able back to room air  Acute on chronic diastolic heart failure -Echocardiogram 2/26: EF 55 to 60%, with grade 1 diastolic dysfunction -BNP 1489 -Continue to monitor strict I's and O's, daily weight -Improved, no further peripheral edema  CAP -Procalcitonin 0.35 -Influenza and COVID-19 negative -Chest x-ray: Bilateral diffuse interstitial  thickening which may reflect interstitial edema versus multilobar pneumonia -Blood cultures pending  -Rocephin, azithromycin   AKI on CKD stage 3a -Baseline creatinine around 1.3-1.4 -Creatinine improving 1.64 today  Elevated troponin -EDP discussed with Cardiology at time of admission, thought it was secondary to HF and not ACS. Did not recommend IV heparin at this time -Peak at 1481 and trended down   -Denies CP on exam  -Echocardiogram did not reveal regional wall motion abnormalities  MM -Followed by Dr. Delton Coombes  CAD status post PCI/DES in 2012 -Continue aspirin, Lipitor, metoprolol  Essential hypertension -Continue Cardizem, Lopressor    DVT prophylaxis: Lovenox Code Status: Full  Family Communication: No family at bedside Disposition Plan:  . Patient is from home prior to admission. . Currently in-hospital treatment needed due to respiratory failure, anemia, AKI requiring close monitoring of O2 and Cr as well as hemoglobin.  Continue to wean nasal cannula O2 to room air.  PT OT eval ordered . Suspect patient will discharge home in 1 to 2 days.   Consultants:   None  Procedures:   None   Antimicrobials:  Anti-infectives (From admission, onward)   Start     Dose/Rate Route Frequency Ordered Stop   09/06/19 1030  azithromycin (ZITHROMAX) 500 mg in sodium chloride 0.9 % 250 mL IVPB     500 mg 250 mL/hr over 60 Minutes Intravenous Every 24 hours 09/05/19 2140 09/11/19 1029   09/06/19 1000  cefTRIAXone (ROCEPHIN) 2 g in sodium chloride 0.9 % 100 mL IVPB     2 g 200 mL/hr over 30 Minutes  Intravenous Every 24 hours 09/05/19 2140 09/11/19 0959   09/05/19 2200  acyclovir (ZOVIRAX) 200 MG capsule 200 mg     200 mg Oral 2 times daily 09/05/19 2140     09/05/19 1515  cefTRIAXone (ROCEPHIN) 1 g in sodium chloride 0.9 % 100 mL IVPB     1 g 200 mL/hr over 30 Minutes Intravenous  Once 09/05/19 1458 09/05/19 1608   09/05/19 1500  azithromycin (ZITHROMAX) tablet 500 mg      500 mg Oral  Once 09/05/19 1458 09/05/19 1538       Objective: Vitals:   09/07/19 0444 09/07/19 0447 09/07/19 0800 09/07/19 0807  BP: (!) 100/49   (!) 103/59  Pulse: 89   91  Resp:      Temp: 97.7 F (36.5 C)     TempSrc: Oral     SpO2: 93%  95% 92%  Weight:  75.3 kg    Height:        Intake/Output Summary (Last 24 hours) at 09/07/2019 0952 Last data filed at 09/07/2019 0445 Gross per 24 hour  Intake 1373 ml  Output 1675 ml  Net -302 ml   Filed Weights   09/05/19 2124 09/06/19 0443 09/07/19 0447  Weight: 76.5 kg 76.5 kg 75.3 kg    Examination: General exam: Appears calm and comfortable  Respiratory system: Clear to auscultation with some crackles left lower base. Respiratory effort normal.  Remains on 4 L nasal cannula O2 Cardiovascular system: S1 & S2 heard, normal sinus rhythm with PACs on telemetry. No pedal edema. Gastrointestinal system: Abdomen is nondistended, soft and nontender. Normal bowel sounds heard. Central nervous system: Alert and oriented. Non focal exam. Speech clear  Extremities: Symmetric in appearance bilaterally  Skin: No rashes, lesions or ulcers on exposed skin  Psychiatry: Judgement and insight appear stable. Mood & affect appropriate.    Data Reviewed: I have personally reviewed following labs and imaging studies  CBC: Recent Labs  Lab 09/01/19 0944 09/05/19 1052 09/06/19 0805 09/07/19 0146  WBC 13.1* 13.1* 8.3 6.9  NEUTROABS 10.7* 11.2*  --   --   HGB 9.3* 7.8* 7.7* 9.4*  HCT 30.0* 26.7* 24.8* 29.9*  MCV 106.8* 112.7* 106.9* 99.0  PLT 281 470* 441* 201*   Basic Metabolic Panel: Recent Labs  Lab 09/01/19 0944 09/02/19 1106 09/05/19 1052 09/06/19 0342 09/07/19 0146  NA 137 139 139 141 142  K 3.5 3.9 4.7 3.6 3.5  CL 102 106 108 106 104  CO2 23 22 21* 24 27  GLUCOSE 136* 145* 129* 111* 125*  BUN 19 24* 24* 20 18  CREATININE 1.40* 1.46* 1.45* 1.84* 1.64*  CALCIUM 8.0* 7.8* 7.8* 7.4* 7.6*   GFR: Estimated Creatinine  Clearance: 29.3 mL/min (A) (by C-G formula based on SCr of 1.64 mg/dL (H)). Liver Function Tests: Recent Labs  Lab 09/01/19 0944 09/05/19 1052  AST 13* 21  ALT 16 13  ALKPHOS 66 77  BILITOT 0.7 0.6  PROT 6.0* 5.7*  ALBUMIN 2.6* 2.3*   No results for input(s): LIPASE, AMYLASE in the last 168 hours. No results for input(s): AMMONIA in the last 168 hours. Coagulation Profile: No results for input(s): INR, PROTIME in the last 168 hours. Cardiac Enzymes: No results for input(s): CKTOTAL, CKMB, CKMBINDEX, TROPONINI in the last 168 hours. BNP (last 3 results) No results for input(s): PROBNP in the last 8760 hours. HbA1C: No results for input(s): HGBA1C in the last 72 hours. CBG: No results for input(s): GLUCAP in the last  168 hours. Lipid Profile: No results for input(s): CHOL, HDL, LDLCALC, TRIG, CHOLHDL, LDLDIRECT in the last 72 hours. Thyroid Function Tests: No results for input(s): TSH, T4TOTAL, FREET4, T3FREE, THYROIDAB in the last 72 hours. Anemia Panel: No results for input(s): VITAMINB12, FOLATE, FERRITIN, TIBC, IRON, RETICCTPCT in the last 72 hours. Sepsis Labs: Recent Labs  Lab 09/05/19 1509  PROCALCITON 0.35    Recent Results (from the past 240 hour(s))  Respiratory Panel by RT PCR (Flu A&B, Covid) - Nasopharyngeal Swab     Status: None   Collection Time: 09/05/19  2:57 PM   Specimen: Nasopharyngeal Swab  Result Value Ref Range Status   SARS Coronavirus 2 by RT PCR NEGATIVE NEGATIVE Final    Comment: (NOTE) SARS-CoV-2 target nucleic acids are NOT DETECTED. The SARS-CoV-2 RNA is generally detectable in upper respiratoy specimens during the acute phase of infection. The lowest concentration of SARS-CoV-2 viral copies this assay can detect is 131 copies/mL. A negative result does not preclude SARS-Cov-2 infection and should not be used as the sole basis for treatment or other patient management decisions. A negative result may occur with  improper specimen  collection/handling, submission of specimen other than nasopharyngeal swab, presence of viral mutation(s) within the areas targeted by this assay, and inadequate number of viral copies (<131 copies/mL). A negative result must be combined with clinical observations, patient history, and epidemiological information. The expected result is Negative. Fact Sheet for Patients:  PinkCheek.be Fact Sheet for Healthcare Providers:  GravelBags.it This test is not yet ap proved or cleared by the [place] FDA and  has been authorized for detection and/or diagnosis of SARS-CoV-2 by FDA under an Emergency Use Authorization (EUA). This EUA will remain  in effect (meaning this test can be used) for the duration of the COVID-19 declaration under Section 564(b)(1) of the Act, 21 U.S.C. section 360bbb-3(b)(1), unless the authorization is terminated or revoked sooner.    Influenza A by PCR NEGATIVE NEGATIVE Final   Influenza B by PCR NEGATIVE NEGATIVE Final    Comment: (NOTE) The Xpert Xpress SARS-CoV-2/FLU/RSV assay is intended as an aid in  the diagnosis of influenza from Nasopharyngeal swab specimens and  should not be used as a sole basis for treatment. Nasal washings and  aspirates are unacceptable for Xpert Xpress SARS-CoV-2/FLU/RSV  testing. Fact Sheet for Patients: PinkCheek.be Fact Sheet for Healthcare Providers: GravelBags.it This test is not yet approved or cleared by the [place] FDA and  has been authorized for detection and/or diagnosis of SARS-CoV-2 by  FDA under an Emergency Use Authorization (EUA). This EUA will remain  in effect (meaning this test can be used) for the duration of the  Covid-19 declaration under Section 564(b)(1) of the Act, 21  U.S.C. section 360bbb-3(b)(1), unless the authorization is  terminated or revoked. Performed at [facility],  [address], [place], [place] [address]   Blood culture (routine x 2)     Status: None (Preliminary result)   Collection Time: 09/05/19  4:38 PM   Specimen: BLOOD RIGHT HAND  Result Value Ref Range Status   Specimen Description BLOOD RIGHT HAND  Final   Special Requests   Final    BOTTLES DRAWN AEROBIC AND ANAEROBIC Blood Culture adequate volume   Culture   Final    NO GROWTH < 24 HOURS Performed at [facility], [address], [place], [place] [address]    Report Status PENDING  Incomplete  Blood culture (routine x 2)     Status:  None (Preliminary result)   Collection Time: 09/05/19  4:39 PM   Specimen: Left Antecubital; Blood  Result Value Ref Range Status   Specimen Description LEFT ANTECUBITAL  Final   Special Requests   Final    BOTTLES DRAWN AEROBIC AND ANAEROBIC Blood Culture adequate volume   Culture   Final    NO GROWTH < 24 HOURS Performed at [facility], [address] [place], [place] [address]    Report Status PENDING  Incomplete      Radiology Studies: DG Chest 2 View  Result Date: 09/05/2019 CLINICAL DATA:  Low oxygen saturation EXAM: CHEST - 2 VIEW COMPARISON:  09/01/2019 FINDINGS: Bilateral diffuse interstitial thickening. No focal consolidation, pleural effusion or pneumothorax. Stable cardiomediastinal silhouette. No aggressive osseous lesion. IMPRESSION: Bilateral diffuse interstitial thickening which may reflect interstitial edema versus multilobar pneumonia. Electronically Signed   By: Kathreen Devoid   On: 09/05/2019 13:35   ECHOCARDIOGRAM COMPLETE  Result Date: 09/05/2019    ECHOCARDIOGRAM REPORT   Patient Name:   JAMESYN MOOREFIELD Date of Exam: 09/05/2019 Medical Rec #:  [id number]    Height:       67.0 in Accession #:    [id number]   Weight:       165.3 lb Date of Birth:  1941/01/14    BSA:          1.865 m Patient Age:    68 years     BP:           108/55 mmHg Patient Gender: F            HR:           103 bpm. Exam Location:  [facility] Procedure: 2D Echo STAT ECHO  Indications:    Congestive Heart Failure 428.0 / I50.9  History:        Patient has prior history of Echocardiogram examinations, most                 recent 01/09/2019. CAD, Signs/Symptoms:Dyspnea; Risk                 Factors:Hypertension, Dyslipidemia and Former Smoker. Chronic                 renal disease.  Sonographer:    Leavy Cella RDCS (AE) Referring Phys: [id number] JULIE IDOL IMPRESSIONS  1. Left ventricular ejection fraction, by estimation, is 55 to 60%. The left ventricle has normal function. The left ventricle has no regional wall motion abnormalities. There is mild concentric left ventricular hypertrophy. Left ventricular diastolic parameters are consistent with Grade I diastolic dysfunction (impaired relaxation).  2. Right ventricular systolic function is normal. The right ventricular size is mildly enlarged. There is mildly elevated pulmonary artery systolic pressure.  3. Right atrial size was mildly dilated.  4. The mitral valve is degenerative. Moderate mitral valve regurgitation.  5. Tricuspid valve regurgitation is mild to moderate.  6. The aortic valve is tricuspid. Aortic valve regurgitation is mild. Mild aortic valve sclerosis is present, with no evidence of aortic valve stenosis.  7. The inferior vena cava is normal in size with greater than 50% respiratory variability, suggesting right atrial pressure of 3 mmHg. FINDINGS  Left Ventricle: Left ventricular ejection fraction, by estimation, is 55 to 60%. The left ventricle has normal function. The left ventricle has no regional wall motion abnormalities. The left ventricular internal cavity size was normal in size. There is  mild concentric left ventricular hypertrophy. Left ventricular diastolic parameters are  consistent with Grade I diastolic dysfunction (impaired relaxation). Indeterminate filling pressures. Right Ventricle: The right ventricular size is mildly enlarged. No increase in right ventricular wall thickness. Right ventricular  systolic function is normal. There is mildly elevated pulmonary artery systolic pressure. The tricuspid regurgitant velocity is 2.64 m/s, and with an assumed right atrial pressure of 10 mmHg, the estimated right ventricular systolic pressure is 51.7 mmHg. Left Atrium: Left atrial size was normal in size. Right Atrium: Right atrial size was mildly dilated. Pericardium: There is no evidence of pericardial effusion. Mitral Valve: The mitral valve is degenerative in appearance. There is mild thickening of the mitral valve leaflet(s). Mild mitral annular calcification. Moderate mitral valve regurgitation. Tricuspid Valve: The tricuspid valve is grossly normal. Tricuspid valve regurgitation is mild to moderate. Aortic Valve: The aortic valve is tricuspid. . There is mild thickening of the aortic valve. Aortic valve regurgitation is mild. Mild aortic valve sclerosis is present, with no evidence of aortic valve stenosis. Mild aortic valve annular calcification. There is mild thickening of the aortic valve. Pulmonic Valve: The pulmonic valve was grossly normal. Pulmonic valve regurgitation is not visualized. Aorta: The aortic root is normal in size and structure. Venous: The inferior vena cava is normal in size with greater than 50% respiratory variability, suggesting right atrial pressure of 3 mmHg. IAS/Shunts: The interatrial septum was not well visualized.  LEFT VENTRICLE PLAX 2D LVIDd:         4.78 cm  Diastology LVIDs:         4.12 cm  LV e' lateral:   6.31 cm/s LV PW:         1.21 cm  LV E/e' lateral: 11.7 LV IVS:        1.10 cm  LV e' medial:    5.11 cm/s LVOT diam:     2.00 cm  LV E/e' medial:  14.4 LVOT Area:     3.14 cm  RIGHT VENTRICLE RV S prime:     15.20 cm/s TAPSE (M-mode): 1.8 cm LEFT ATRIUM             Index       RIGHT ATRIUM           Index LA diam:        3.90 cm 2.09 cm/m  RA Area:     17.90 cm LA Vol (A2C):   44.2 ml 23.70 ml/m RA Volume:   51.30 ml  27.50 ml/m LA Vol (A4C):   55.3 ml 29.65 ml/m  LA Biplane Vol: 51.6 ml 27.67 ml/m   AORTA Ao Root diam: 2.90 cm MITRAL VALVE                TRICUSPID VALVE MV Area (PHT): 4.49 cm     TR Peak grad:   27.9 mmHg MV Decel Time: 169 msec     TR Vmax:        264.00 cm/s MR Peak grad:   83.2 mmHg MR Mean grad:   55.0 mmHg   SHUNTS MR Vmax:        456.00 cm/s Systemic Diam: 2.00 cm MR Vmean:       352.0 cm/s MR PISA:        1.01 cm MR PISA Radius: 0.40 cm MV E velocity: 73.70 cm/s MV A velocity: 89.30 cm/s MV E/A ratio:  0.83 Kate Sable MD Electronically signed by Kate Sable MD Signature Date/Time: 09/05/2019/2:33:25 PM    Final       Scheduled Meds: .  sodium chloride   Intravenous Once  . sodium chloride   Intravenous Once  . sodium chloride   Intravenous Once  . acyclovir  200 mg Oral BID  . aspirin EC  81 mg Oral Daily  . atorvastatin  10 mg Oral q1800  . diltiazem  240 mg Oral Daily  . enoxaparin (LOVENOX) injection  30 mg Subcutaneous Q24H  . fluticasone  2 spray Each Nare Daily  . metoprolol tartrate  37.5 mg Oral BID  . montelukast  10 mg Oral QHS  . pantoprazole  40 mg Oral Daily  . potassium chloride  10 mEq Oral TID  . sodium chloride flush  3 mL Intravenous Q12H  . umeclidinium-vilanterol  1 puff Inhalation Daily   Continuous Infusions: . sodium chloride    . azithromycin 500 mg (09/06/19 1139)  . cefTRIAXone (ROCEPHIN)  IV 2 g (09/07/19 0932)     LOS: 2 days      Time spent: 25 minutes   Dessa Phi, DO [facility] 09/07/2019, 9:52 AM   Available via Epic secure chat 7am-7pm After these hours, please refer to coverage provider listed on [email or web address]

## 2019-09-08 ENCOUNTER — Ambulatory Visit (HOSPITAL_COMMUNITY): Payer: Medicare Other

## 2019-09-08 ENCOUNTER — Inpatient Hospital Stay (HOSPITAL_COMMUNITY): Payer: Medicare Other

## 2019-09-08 ENCOUNTER — Ambulatory Visit (HOSPITAL_COMMUNITY): Payer: Medicare Other | Admitting: Hematology

## 2019-09-08 LAB — BASIC METABOLIC PANEL
Anion gap: 11 (ref 5–15)
BUN: 15 mg/dL (ref 8–23)
CO2: 26 mmol/L (ref 22–32)
Calcium: 7.8 mg/dL — ABNORMAL LOW (ref 8.9–10.3)
Chloride: 105 mmol/L (ref 98–111)
Creatinine, Ser: 1.53 mg/dL — ABNORMAL HIGH (ref 0.44–1.00)
GFR calc Af Amer: 37 mL/min — ABNORMAL LOW (ref >60)
GFR calc non Af Amer: 32 mL/min — ABNORMAL LOW (ref >60)
Glucose, Bld: 107 mg/dL — ABNORMAL HIGH (ref 70–99)
Potassium: 4 mmol/L (ref 3.5–5.1)
Sodium: 142 mmol/L (ref 135–145)

## 2019-09-08 LAB — CBC
HCT: 29.4 % — ABNORMAL LOW (ref 36.0–46.0)
Hemoglobin: 9.1 g/dL — ABNORMAL LOW (ref 12.0–15.0)
MCH: 31.3 pg (ref 26.0–34.0)
MCHC: 31 g/dL (ref 30.0–36.0)
MCV: 101 fL — ABNORMAL HIGH (ref 80.0–100.0)
Platelets: 407 10*3/uL — ABNORMAL HIGH (ref 150–400)
RBC: 2.91 MIL/uL — ABNORMAL LOW (ref 3.87–5.11)
RDW: 24.5 % — ABNORMAL HIGH (ref 11.5–15.5)
WBC: 7 10*3/uL (ref 4.0–10.5)
nRBC: 0 % (ref 0.0–0.2)

## 2019-09-08 MED ORDER — METOPROLOL TARTRATE 12.5 MG HALF TABLET
12.5000 mg | ORAL_TABLET | Freq: Two times a day (BID) | ORAL | Status: DC
  Administered 2019-09-08 – 2019-09-09 (×3): 12.5 mg via ORAL
  Filled 2019-09-08 (×3): qty 1

## 2019-09-08 MED ORDER — DILTIAZEM HCL ER COATED BEADS 120 MG PO CP24
120.0000 mg | ORAL_CAPSULE | Freq: Every day | ORAL | Status: DC
  Administered 2019-09-08 – 2019-09-09 (×2): 120 mg via ORAL
  Filled 2019-09-08 (×2): qty 1

## 2019-09-08 MED ORDER — ENOXAPARIN SODIUM 40 MG/0.4ML ~~LOC~~ SOLN
40.0000 mg | SUBCUTANEOUS | Status: DC
  Administered 2019-09-09: 09:00:00 40 mg via SUBCUTANEOUS
  Filled 2019-09-08: qty 0.4

## 2019-09-08 NOTE — Progress Notes (Signed)
NT called to state pt O2 sat in the 70's on RA. Pt placed back on [place] at previous setting of 4L. Pt placed on continuous pulse ox. Pts sats now 95%.

## 2019-09-08 NOTE — Plan of Care (Signed)
  Problem: Education: Goal: Ability to demonstrate management of disease process will improve Outcome: Progressing Goal: Ability to verbalize understanding of medication therapies will improve Outcome: Progressing   Problem: Activity: Goal: Capacity to carry out activities will improve Outcome: Progressing   

## 2019-09-08 NOTE — Evaluation (Signed)
Occupational Therapy Evaluation Patient Details Name: Carol Bradley MRN: [id number] DOB: March 31, 1941 Today's Date: 09/08/2019    History of Present Illness Pt is a 79 yo female s/p 7 days of SOB, N/V. Found to be anemic, EF 55-60% and requiring O2. PMHx: CAD, myeloma, HTN, COPD, back sx, neck sx   Clinical Impression   Pt PTA: Pt living alone and reports independence prior. Pt currently limited by SOB and activity tolerance. Pt performing ADL at sink with supervisionA. Pt on 3L O2 90-92% with exertion. Pt supervisionA for mobility with RW for stability. Pt could benefit from continued OT acutely. OT following for energy conservation.     Follow Up Recommendations  Home health OT(or OP cardiopulomary rehab may be more appropriate)    Equipment Recommendations  None recommended by OT    Recommendations for Other Services       Precautions / Restrictions Precautions Precautions: Fall;Other (comment) Precaution Comments: watch O2 Restrictions Weight Bearing Restrictions: No      Mobility Bed Mobility Overal bed mobility: Modified Independent                Transfers Overall transfer level: Needs assistance Equipment used: Rolling walker (2 wheeled) Transfers: Sit to/from Stand Sit to Stand: Supervision              Balance Overall balance assessment: No apparent balance deficits (not formally assessed)                                         ADL either performed or assessed with clinical judgement   ADL Overall ADL's : Needs assistance/impaired Eating/Feeding: Modified independent;Sitting   Grooming: Supervision/safety;Standing   Upper Body Bathing: Supervision/ safety;Sitting   Lower Body Bathing: Supervison/ safety   Upper Body Dressing : Supervision/safety;Sitting   Lower Body Dressing: Min guard;Sitting/lateral leans;Sit to/from stand   Toilet Transfer: Supervision/safety;Ambulation   Toileting- Clothing Manipulation and Hygiene:  Supervision/safety;Sitting/lateral lean;Sit to/from stand       Functional mobility during ADLs: Supervision/safety;Rolling walker General ADL Comments: Pt limited by decreased activity tolerance and increased SOB upon exertion.     Vision Baseline Vision/History: Wears glasses Wears Glasses: At all times Patient Visual Report: No change from baseline Vision Assessment?: No apparent visual deficits     Perception     Praxis      Pertinent Vitals/Pain Pain Assessment: No/denies pain     Hand Dominance Right   Extremity/Trunk Assessment Upper Extremity Assessment Upper Extremity Assessment: Overall WFL for tasks assessed   Lower Extremity Assessment Lower Extremity Assessment: Generalized weakness   Cervical / Trunk Assessment Cervical / Trunk Assessment: Normal   Communication Communication Communication: No difficulties   Cognition Arousal/Alertness: Awake/alert Behavior During Therapy: WFL for tasks assessed/performed Overall Cognitive Status: Within Functional Limits for tasks assessed                                     General Comments  Pt on 3L O2 90-92% with exertion.    Exercises     Shoulder Instructions      Home Living Family/patient expects to be discharged to:: Private residence Living Arrangements: Alone Available Help at Discharge: Family;Available PRN/intermittently Type of Home: House Home Access: Stairs to enter [employer] of Steps: front:4; back:2; garage:0 Entrance Stairs-Rails: Can reach both(at front) Home Layout: Multi-level;Bed/bath  upstairs Alternate Level Stairs-Number of Steps: 6-7 steps for 3 levels   Bathroom Shower/Tub: Tub/shower unit;Walk-in shower   Bathroom Toilet: Handicapped height     Home Equipment: Shower seat;Cane - single point;Walker - 2 wheels   Additional Comments: brother checks on him; son calls 2x daily      Prior Functioning/Environment Level of Independence: Independent         Comments: driving        OT Problem List: Decreased strength;Decreased activity tolerance;Impaired balance (sitting and/or standing);Decreased safety awareness;Pain      OT Treatment/Interventions: Self-care/ADL training;Therapeutic exercise;Energy conservation;DME and/or AE instruction;Therapeutic activities;Patient/family education;Balance training    OT Goals(Current goals can be found in the care plan section) Acute Rehab OT Goals Patient Stated Goal: to go home OT Goal Formulation: With patient Time For Goal Achievement: 09/22/19 Potential to Achieve Goals: Good ADL Goals Pt Will Perform Grooming: with modified independence;standing Pt/caregiver will Perform Home Exercise Program: Increased strength;Both right and left upper extremity;With Supervision Additional ADL Goal #1: Pt will recall 3 strategies for energy conservation techniques to be used for ADL after education.  OT Frequency: Min 2X/week   Barriers to D/C:            Co-evaluation              AM-PAC OT "6 Clicks" Daily Activity     Outcome Measure Help from another person eating meals?: None Help from another person taking care of personal grooming?: None Help from another person toileting, which includes using toliet, bedpan, or urinal?: A Little Help from another person bathing (including washing, rinsing, drying)?: A Little Help from another person to put on and taking off regular upper body clothing?: None Help from another person to put on and taking off regular lower body clothing?: None 6 Click Score: 22   End of Session Equipment Utilized During Treatment: Gait belt;Rolling walker Nurse Communication: Mobility status  Activity Tolerance: Patient tolerated treatment well Patient left: in bed;with call bell/phone within reach  OT Visit Diagnosis: Unsteadiness on feet (R26.81);Muscle weakness (generalized) (M62.81)                Time: [id number] OT Time Calculation (min): 29  min Charges:  OT General Charges $OT Visit: 1 Visit OT Evaluation $OT Eval Moderate Complexity: 1 Mod OT Treatments $Self Care/Home Management : 8-22 mins  Jefferey Pica, OTR/L Acute Rehabilitation Services Pager: [phone] Office: [phone]   Regnia Mathwig C 09/08/2019, 3:42 PM

## 2019-09-08 NOTE — Evaluation (Signed)
Physical Therapy Evaluation Patient Details Name: Carol Bradley MRN: [id number] DOB: 01-30-1941 Today's Date: 09/08/2019   History of Present Illness  Pt is a 79 yo female s/p 7 days of SOB, N/V. Found to be anemic, EF 55-60% and requiring O2. PMHx: CAD, myeloma, HTN, COPD, back sx, neck sx  Clinical Impression  Patient presents with mobility close to current baseline.  Normally ambulatory without devices and cooking big meals for her family once a week, but since last chemo she has declined and is using a RW at home and not cooking (normally gets meals at local drive through).  She presents with decreased mobility due to decreased activity tolerance, decreased balance and decreased strength.  She will benefit from skilled PT in the acute setting to allow return home with intermittent family support and follow up HHPT.  She may also need a HH aide initially.  Will continue to follow.     Follow Up Recommendations Home health PT;Supervision - Intermittent(possibly HH aide)    Equipment Recommendations  None recommended by PT    Recommendations for Other Services       Precautions / Restrictions Precautions Precautions: Fall Precaution Comments: watch O2 Restrictions Weight Bearing Restrictions: No      Mobility  Bed Mobility Overal bed mobility: Modified Independent                Transfers Overall transfer level: Needs assistance Equipment used: Rolling walker (2 wheeled) Transfers: Sit to/from Stand Sit to Stand: Supervision         General transfer comment: for safety  Ambulation/Gait Ambulation/Gait assistance: Supervision Gait Distance (Feet): 125 Feet Assistive device: Rolling walker (2 wheeled) Gait Pattern/deviations: Step-through pattern;Decreased stride length;Trunk flexed     General Gait Details: generally safe and balanced with RW; SpO2 dropped to 86% on 3L portable O2  Stairs            Wheelchair Mobility    Modified Rankin (Stroke  Patients Only)       Balance Overall balance assessment: Mild deficits observed, not formally tested                                           Pertinent Vitals/Pain Pain Assessment: No/denies pain    Home Living Family/patient expects to be discharged to:: Private residence Living Arrangements: Alone Available Help at Discharge: Family;Available PRN/intermittently Type of Home: House Home Access: Stairs to enter Entrance Stairs-Rails: Can reach both(at front steps) Entrance Stairs-Number of Steps: front:4; back:2; garage:0 Home Layout: Multi-level;Bed/bath upstairs Home Equipment: Shower seat;Cane - single point;Walker - 2 wheels Additional Comments: brother checks on her; son calls 2x daily, and stays with her every weekend, sister lives next door    Prior Function Level of Independence: Independent with assistive device(s)         Comments: uses RW recently, has had decline past month or so with fourth round of chemo, planning different treatment since she cannot tolerate it per pt/son     Hand Dominance   Dominant Hand: Right    Extremity/Trunk Assessment   Upper Extremity Assessment Upper Extremity Assessment: Defer to OT evaluation    Lower Extremity Assessment Lower Extremity Assessment: Generalized weakness    Cervical / Trunk Assessment Cervical / Trunk Assessment: Normal  Communication   Communication: HOH  Cognition Arousal/Alertness: Awake/alert Behavior During Therapy: WFL for tasks assessed/performed Overall Cognitive Status: Within  Functional Limits for tasks assessed                                        General Comments General comments (skin integrity, edema, etc.): SpO2 86% with ambulation on 3L O2, back to 91% in about 1 minute with PLB on 3L O2    Exercises     Assessment/Plan    PT Assessment Patient needs continued PT services  PT Problem List Decreased strength;Decreased activity  tolerance;Decreased mobility;Cardiopulmonary status limiting activity;Decreased balance       PT Treatment Interventions DME instruction;Stair training;Therapeutic activities;Balance training;Gait training;Functional mobility training;Therapeutic exercise;Patient/family education    PT Goals (Current goals can be found in the Care Plan section)  Acute Rehab PT Goals Patient Stated Goal: to go home PT Goal Formulation: With patient/family Time For Goal Achievement: 09/22/19 Potential to Achieve Goals: Good    Frequency Min 3X/week   Barriers to discharge        Co-evaluation               AM-PAC PT "6 Clicks" Mobility  Outcome Measure Help needed turning from your back to your side while in a flat bed without using bedrails?: None Help needed moving from lying on your back to sitting on the side of a flat bed without using bedrails?: None Help needed moving to and from a bed to a chair (including a wheelchair)?: None Help needed standing up from a chair using your arms (e.g., wheelchair or bedside chair)?: None Help needed to walk in hospital room?: None Help needed climbing 3-5 steps with a railing? : A Little 6 Click Score: 23    End of Session Equipment Utilized During Treatment: Oxygen Activity Tolerance: Patient limited by fatigue Patient left: in bed;with call bell/phone within reach;with family/visitor present   PT Visit Diagnosis: Muscle weakness (generalized) (M62.81)    Time: [id number] PT Time Calculation (min) (ACUTE ONLY): 26 min   Charges:   PT Evaluation $PT Eval Moderate Complexity: 1 Mod PT Treatments $Gait Training: 8-22 mins        Carol Bradley, [place] [phone] 09/08/2019   Carol Bradley 09/08/2019, 4:37 PM

## 2019-09-08 NOTE — Progress Notes (Signed)
PROGRESS NOTE    Carol Bradley  [id number] DOB: [birth year] DOA: 09/05/2019 PCP: [name], MD     Brief Narrative:  Carol Bradley is a 79 y.o. 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  Patient presents with 7-day history of worsening weakness with shortness of breath, nausea, vomiting.  She was seen and evaluated at the cancer center, found to be hypoxic to 56% on room air and was found to be quite anemic with hemoglobin of 7.8.  Her symptoms are worse with ambulation and exertion and improved with rest.   New events last 24 hours / Subjective: States that her breathing is improving, now down to 3 L nasal cannula O2 this morning.  She denies any chest pain, cough, worsening shortness of breath or peripheral edema.  Assessment & Plan:   Principal Problem:   Symptomatic anemia Active Problems:   CAD (coronary artery disease)   Hypertension   Multiple myeloma (HCC)   Emphysema/COPD (HCC)   Anemia due to bone marrow failure (HCC)   Chronic diastolic congestive heart failure (HCC)   Chronic renal disease, stage 3, moderately decreased glomerular filtration rate between 30-59 mL/min/1.73 square meter   Elevated troponin   Community acquired pneumonia    Symptomatic anemia secondary to MM -Baseline hemoglobin appears to be around 9-10 -Status post 1 unit packed red blood cell 2/27 with improvement in hemoglobin  -Trend CBC.  Hemoglobin stable 9.1 this morning  Acute hypoxemic respiratory failure -Currently requiring 3 L nasal cannula O2, continue to wean as able back to room air  Acute on chronic diastolic heart failure -Echocardiogram 2/26: EF 55 to 60%, with grade 1 diastolic dysfunction -BNP 1489 -Continue to monitor strict I's and O's, daily weight -Improved, no further peripheral edema.  Stop IV Lasix  CAP -Procalcitonin  0.35 -Influenza and COVID-19 negative -Chest x-ray: Bilateral diffuse interstitial thickening which may reflect interstitial edema versus multilobar pneumonia -Blood cultures negative to date -Rocephin, azithromycin   AKI on CKD stage 3a -Baseline creatinine around 1.3-1.4 -Creatinine improving 1.53 today  Elevated troponin -EDP discussed with Cardiology at time of admission, thought it was secondary to HF and not ACS. Did not recommend IV heparin at this time -Peak at 1481 and trended down   -Denies CP on exam  -Echocardiogram did not reveal regional wall motion abnormalities  MM -Followed by Dr. Delton Coombes  CAD status post PCI/DES in 2012 -Continue aspirin, Lipitor, Lopressor dose decreased due to mild hypotension  Essential hypertension -Continue Cardizem, Lopressor doses decreased due to mild hypotension    DVT prophylaxis: Lovenox Code Status: Full  Family Communication: No family at bedside Disposition Plan:  . Patient is from home prior to admission. . Currently in-hospital treatment needed due to respiratory failure. Continue to wean nasal cannula O2 to room air.  PT OT eval ordered . Suspect patient will discharge home in 1 to 2 days.   Consultants:   None  Procedures:   None   Antimicrobials:  Anti-infectives (From admission, onward)   Start     Dose/Rate Route Frequency Ordered Stop   09/06/19 1030  azithromycin (ZITHROMAX) 500 mg in sodium chloride 0.9 % 250 mL IVPB     500 mg 250 mL/hr over 60 Minutes Intravenous Every 24 hours 09/05/19 2140 09/11/19 1029   09/06/19 1000  cefTRIAXone (ROCEPHIN) 2 g in sodium chloride 0.9 % 100 mL IVPB  2 g 200 mL/hr over 30 Minutes Intravenous Every 24 hours 09/05/19 2140 09/11/19 0959   09/05/19 2200  acyclovir (ZOVIRAX) 200 MG capsule 200 mg     200 mg Oral 2 times daily 09/05/19 2140     09/05/19 1515  cefTRIAXone (ROCEPHIN) 1 g in sodium chloride 0.9 % 100 mL IVPB     1 g 200 mL/hr over 30 Minutes  Intravenous  Once 09/05/19 1458 09/05/19 1608   09/05/19 1500  azithromycin (ZITHROMAX) tablet 500 mg     500 mg Oral  Once 09/05/19 1458 09/05/19 1538       Objective: Vitals:   09/08/19 0300 09/08/19 0512 09/08/19 0837 09/08/19 0851  BP:  (!) 105/57  (!) 97/48  Pulse:  90  95  Resp:  19    Temp:  98.2 F (36.8 C)    TempSrc:  Oral    SpO2:  92% 95% 95%  Weight: 74.5 kg     Height:        Intake/Output Summary (Last 24 hours) at 09/08/2019 1035 Last data filed at 09/08/2019 0900 Gross per 24 hour  Intake 1330.06 ml  Output 500 ml  Net 830.06 ml   Filed Weights   09/06/19 0443 09/07/19 0447 09/08/19 0300  Weight: 76.5 kg 75.3 kg 74.5 kg    Examination: General exam: Appears calm and comfortable  Respiratory system: Clear to auscultation. Respiratory effort normal.  On 3 L nasal cannula O2 Cardiovascular system: S1 & S2 heard, RRR. No pedal edema. Gastrointestinal system: Abdomen is nondistended, soft and nontender. Normal bowel sounds heard. Central nervous system: Alert and oriented. Non focal exam. Speech clear  Extremities: Symmetric in appearance bilaterally  Skin: No rashes, lesions or ulcers on exposed skin  Psychiatry: Judgement and insight appear stable. Mood & affect appropriate.   Data Reviewed: I have personally reviewed following labs and imaging studies  CBC: Recent Labs  Lab 09/05/19 1052 09/06/19 0805 09/07/19 0146 09/08/19 0331  WBC 13.1* 8.3 6.9 7.0  NEUTROABS 11.2*  --   --   --   HGB 7.8* 7.7* 9.4* 9.1*  HCT 26.7* 24.8* 29.9* 29.4*  MCV 112.7* 106.9* 99.0 101.0*  PLT 470* 441* 436* 415*   Basic Metabolic Panel: Recent Labs  Lab 09/02/19 1106 09/05/19 1052 09/06/19 0342 09/07/19 0146 09/08/19 0331  NA 139 139 141 142 142  K 3.9 4.7 3.6 3.5 4.0  CL 106 108 106 104 105  CO2 22 21* _0 GLUCOSE 145* 129* 111* 125* 107*  BUN 24* 24* _1 CREATININE 1.46* 1.45* 1.84* 1.64* 1.53*  CALCIUM 7.8* 7.8* 7.4* 7.6* 7.8*    GFR: Estimated Creatinine Clearance: 31.3 mL/min (A) (by C-G formula based on SCr of 1.53 mg/dL (H)). Liver Function Tests: Recent Labs  Lab 09/05/19 1052  AST 21  ALT 13  ALKPHOS 77  BILITOT 0.6  PROT 5.7*  ALBUMIN 2.3*   No results for input(s): LIPASE, AMYLASE in the last 168 hours. No results for input(s): AMMONIA in the last 168 hours. Coagulation Profile: No results for input(s): INR, PROTIME in the last 168 hours. Cardiac Enzymes: No results for input(s): CKTOTAL, CKMB, CKMBINDEX, TROPONINI in the last 168 hours. BNP (last 3 results) No results for input(s): PROBNP in the last 8760 hours. HbA1C: No results for input(s): HGBA1C in the last 72 hours. CBG: No results for input(s): GLUCAP in the last 168 hours. Lipid Profile: No results for input(s): CHOL, HDL, LDLCALC, TRIG, CHOLHDL,  LDLDIRECT in the last 72 hours. Thyroid Function Tests: No results for input(s): TSH, T4TOTAL, FREET4, T3FREE, THYROIDAB in the last 72 hours. Anemia Panel: No results for input(s): VITAMINB12, FOLATE, FERRITIN, TIBC, IRON, RETICCTPCT in the last 72 hours. Sepsis Labs: Recent Labs  Lab 09/05/19 1509  PROCALCITON 0.35    Recent Results (from the past 240 hour(s))  Respiratory Panel by RT PCR (Flu A&B, Covid) - Nasopharyngeal Swab     Status: None   Collection Time: 09/05/19  2:57 PM   Specimen: Nasopharyngeal Swab  Result Value Ref Range Status   SARS Coronavirus 2 by RT PCR NEGATIVE NEGATIVE Final    Comment: (NOTE) SARS-CoV-2 target nucleic acids are NOT DETECTED. The SARS-CoV-2 RNA is generally detectable in upper respiratoy specimens during the acute phase of infection. The lowest concentration of SARS-CoV-2 viral copies this assay can detect is 131 copies/mL. A negative result does not preclude SARS-Cov-2 infection and should not be used as the sole basis for treatment or other patient management decisions. A negative result may occur with  improper specimen  collection/handling, submission of specimen other than nasopharyngeal swab, presence of viral mutation(s) within the areas targeted by this assay, and inadequate number of viral copies (<131 copies/mL). A negative result must be combined with clinical observations, patient history, and epidemiological information. The expected result is Negative. Fact Sheet for Patients:  PinkCheek.be Fact Sheet for Healthcare Providers:  GravelBags.it This test is not yet ap proved or cleared by the [place] FDA and  has been authorized for detection and/or diagnosis of SARS-CoV-2 by FDA under an Emergency Use Authorization (EUA). This EUA will remain  in effect (meaning this test can be used) for the duration of the COVID-19 declaration under Section 564(b)(1) of the Act, 21 U.S.C. section 360bbb-3(b)(1), unless the authorization is terminated or revoked sooner.    Influenza A by PCR NEGATIVE NEGATIVE Final   Influenza B by PCR NEGATIVE NEGATIVE Final    Comment: (NOTE) The Xpert Xpress SARS-CoV-2/FLU/RSV assay is intended as an aid in  the diagnosis of influenza from Nasopharyngeal swab specimens and  should not be used as a sole basis for treatment. Nasal washings and  aspirates are unacceptable for Xpert Xpress SARS-CoV-2/FLU/RSV  testing. Fact Sheet for Patients: PinkCheek.be Fact Sheet for Healthcare Providers: GravelBags.it This test is not yet approved or cleared by the [place] FDA and  has been authorized for detection and/or diagnosis of SARS-CoV-2 by  FDA under an Emergency Use Authorization (EUA). This EUA will remain  in effect (meaning this test can be used) for the duration of the  Covid-19 declaration under Section 564(b)(1) of the Act, 21  U.S.C. section 360bbb-3(b)(1), unless the authorization is  terminated or revoked. Performed at [facility],  [address] [place], [place] [address]   Blood culture (routine x 2)     Status: None (Preliminary result)   Collection Time: 09/05/19  4:38 PM   Specimen: BLOOD RIGHT HAND  Result Value Ref Range Status   Specimen Description BLOOD RIGHT HAND  Final   Special Requests   Final    BOTTLES DRAWN AEROBIC AND ANAEROBIC Blood Culture adequate volume   Culture   Final    NO GROWTH 3 DAYS Performed at [facility], [address], [place], [place] [address]    Report Status PENDING  Incomplete  Blood culture (routine x 2)     Status: None (Preliminary result)   Collection Time: 09/05/19  4:39 PM   Specimen:  Left Antecubital; Blood  Result Value Ref Range Status   Specimen Description LEFT ANTECUBITAL  Final   Special Requests   Final    BOTTLES DRAWN AEROBIC AND ANAEROBIC Blood Culture adequate volume   Culture   Final    NO GROWTH 3 DAYS Performed at [facility], [address], [place], [place] [address]    Report Status PENDING  Incomplete      Radiology Studies: No results found.    Scheduled Meds: . acyclovir  200 mg Oral BID  . aspirin EC  81 mg Oral Daily  . atorvastatin  10 mg Oral q1800  . diltiazem  120 mg Oral Daily  . [START ON 09/09/2019] enoxaparin (LOVENOX) injection  40 mg Subcutaneous Q24H  . fluticasone  2 spray Each Nare Daily  . metoprolol tartrate  12.5 mg Oral BID  . montelukast  10 mg Oral QHS  . pantoprazole  40 mg Oral Daily  . potassium chloride  10 mEq Oral TID  . sodium chloride flush  3 mL Intravenous Q12H  . umeclidinium-vilanterol  1 puff Inhalation Daily   Continuous Infusions: . sodium chloride    . azithromycin 500 mg (09/07/19 1104)  . cefTRIAXone (ROCEPHIN)  IV 2 g (09/08/19 0858)     LOS: 3 days      Time spent: 25 minutes   Dessa Phi, DO [facility] 09/08/2019, 10:35 AM   Available via Epic secure chat 7am-7pm After these hours, please refer to coverage provider listed on [email or web address]

## 2019-09-09 ENCOUNTER — Inpatient Hospital Stay (HOSPITAL_COMMUNITY): Payer: Medicare Other

## 2019-09-09 ENCOUNTER — Encounter (HOSPITAL_COMMUNITY): Payer: Self-pay | Admitting: Internal Medicine

## 2019-09-09 ENCOUNTER — Other Ambulatory Visit: Payer: Self-pay

## 2019-09-09 ENCOUNTER — Other Ambulatory Visit (HOSPITAL_COMMUNITY): Payer: Self-pay | Admitting: Nurse Practitioner

## 2019-09-09 DIAGNOSIS — D649 Anemia, unspecified: Secondary | ICD-10-CM

## 2019-09-09 LAB — TYPE AND SCREEN
ABO/RH(D): O POS
ABO/RH(D): O POS
Antibody Screen: POSITIVE
Antibody Screen: POSITIVE
Unit division: 0
Unit division: 0

## 2019-09-09 LAB — CBC
HCT: 30.8 % — ABNORMAL LOW (ref 36.0–46.0)
Hemoglobin: 9.6 g/dL — ABNORMAL LOW (ref 12.0–15.0)
MCH: 31.6 pg (ref 26.0–34.0)
MCHC: 31.2 g/dL (ref 30.0–36.0)
MCV: 101.3 fL — ABNORMAL HIGH (ref 80.0–100.0)
Platelets: 421 10*3/uL — ABNORMAL HIGH (ref 150–400)
RBC: 3.04 MIL/uL — ABNORMAL LOW (ref 3.87–5.11)
RDW: 23.6 % — ABNORMAL HIGH (ref 11.5–15.5)
WBC: 6.5 10*3/uL (ref 4.0–10.5)
nRBC: 0 % (ref 0.0–0.2)

## 2019-09-09 LAB — GLUCOSE, CAPILLARY: Glucose-Capillary: 125 mg/dL — ABNORMAL HIGH (ref 70–99)

## 2019-09-09 LAB — BASIC METABOLIC PANEL
Anion gap: 9 (ref 5–15)
Anion gap: 8 (ref 5–15)
BUN: 13 mg/dL (ref 8–23)
BUN: 26 mg/dL — ABNORMAL HIGH (ref 8–23)
CO2: 23 mmol/L (ref 22–32)
CO2: 22 mmol/L (ref 22–32)
Calcium: 8 mg/dL — ABNORMAL LOW (ref 8.9–10.3)
Calcium: 7.1 mg/dL — ABNORMAL LOW (ref 8.9–10.3)
Chloride: 109 mmol/L (ref 98–111)
Chloride: 114 mmol/L — ABNORMAL HIGH (ref 98–111)
Creatinine, Ser: 1.19 mg/dL — ABNORMAL HIGH (ref 0.44–1.00)
Creatinine, Ser: 1.34 mg/dL — ABNORMAL HIGH (ref 0.44–1.00)
GFR calc Af Amer: 51 mL/min — ABNORMAL LOW (ref >60)
GFR calc Af Amer: 44 mL/min — ABNORMAL LOW (ref >60)
GFR calc non Af Amer: 44 mL/min — ABNORMAL LOW (ref >60)
GFR calc non Af Amer: 38 mL/min — ABNORMAL LOW (ref >60)
Glucose, Bld: 98 mg/dL (ref 70–99)
Glucose, Bld: 130 mg/dL — ABNORMAL HIGH (ref 70–99)
Potassium: 4.4 mmol/L (ref 3.5–5.1)
Potassium: 5 mmol/L (ref 3.5–5.1)
Sodium: 141 mmol/L (ref 135–145)
Sodium: 144 mmol/L (ref 135–145)

## 2019-09-09 LAB — BPAM RBC
Blood Product Expiration Date: 202103222359
Blood Product Expiration Date: 202104062359
ISSUE DATE / TIME: 202102272130
Unit Type and Rh: 5100
Unit Type and Rh: 5100

## 2019-09-09 LAB — OCCULT BLOOD X 1 CARD TO LAB, STOOL
Fecal Occult Bld: POSITIVE — AB
Fecal Occult Bld: POSITIVE — AB

## 2019-09-09 LAB — MRSA PCR SCREENING: MRSA by PCR: NEGATIVE

## 2019-09-09 LAB — HEMOGLOBIN AND HEMATOCRIT, BLOOD
HCT: 28.6 % — ABNORMAL LOW (ref 36.0–46.0)
HCT: 24.9 % — ABNORMAL LOW (ref 36.0–46.0)
Hemoglobin: 8.5 g/dL — ABNORMAL LOW (ref 12.0–15.0)
Hemoglobin: 7.5 g/dL — ABNORMAL LOW (ref 12.0–15.0)

## 2019-09-09 LAB — PREPARE RBC (CROSSMATCH)

## 2019-09-09 MED ORDER — FUROSEMIDE 10 MG/ML IJ SOLN
40.0000 mg | Freq: Once | INTRAMUSCULAR | Status: AC
  Administered 2019-09-09: 40 mg via INTRAVENOUS
  Filled 2019-09-09: qty 4

## 2019-09-09 MED ORDER — SODIUM CHLORIDE 0.9 % IV BOLUS: 500.0000 mL | Freq: Once | INTRAVENOUS | Status: DC

## 2019-09-09 MED ORDER — SODIUM CHLORIDE 0.9% IV SOLUTION: Freq: Once | INTRAVENOUS | Status: DC

## 2019-09-09 MED ORDER — SIMETHICONE 80 MG PO CHEW
80.0000 mg | CHEWABLE_TABLET | Freq: Four times a day (QID) | ORAL | Status: DC | PRN
  Filled 2019-09-09: qty 1

## 2019-09-09 MED ORDER — PROMETHAZINE HCL 25 MG/ML IJ SOLN
12.5000 mg | Freq: Four times a day (QID) | INTRAMUSCULAR | Status: DC | PRN
  Administered 2019-09-09: 12.5 mg via INTRAVENOUS
  Filled 2019-09-09: qty 1

## 2019-09-09 MED ORDER — SODIUM CHLORIDE 0.9 % IV SOLN: INTRAVENOUS | Status: DC

## 2019-09-09 MED ORDER — NOREPINEPHRINE 4 MG/250ML-% IV SOLN
0.0000 ug/min | INTRAVENOUS | Status: DC
  Filled 2019-09-09: qty 250

## 2019-09-09 MED ORDER — SODIUM CHLORIDE 0.9 % IV BOLUS: 1000.0000 mL | Freq: Once | INTRAVENOUS | Status: DC

## 2019-09-09 MED ORDER — CHLORHEXIDINE GLUCONATE CLOTH 2 % EX PADS
6.0000 | MEDICATED_PAD | Freq: Every day | CUTANEOUS | Status: DC
  Administered 2019-09-09 – 2019-09-12 (×4): 6 via TOPICAL

## 2019-09-09 MED ORDER — PANTOPRAZOLE SODIUM 40 MG IV SOLR
40.0000 mg | Freq: Two times a day (BID) | INTRAVENOUS | Status: DC
  Administered 2019-09-09 – 2019-09-11 (×5): 40 mg via INTRAVENOUS
  Filled 2019-09-09 (×5): qty 40

## 2019-09-09 NOTE — Progress Notes (Signed)
PT Cancellation Note  Patient Details Name: KAMARIA ZAPPA MRN: [id number] DOB: 02/10/1941   Cancelled Treatment:    Reason Eval/Treat Not Completed: Patient declined, no reason specified Pt declined participating in therapy at this time due to nausea. PT will continue to follow acutely.   Earney Navy, PTA Acute Rehabilitation Services Pager: [phone] Office: [phone]   09/09/2019, 10:17 AM

## 2019-09-09 NOTE — Progress Notes (Signed)
Was called in pt room,  Pt is coughing out phlegm with small   amt of  Bright blood. MD made aware.

## 2019-09-09 NOTE — TOC Initial Note (Addendum)
Transition of Care [facility]) - Initial/Assessment Note    Patient Details  Name: Carol Bradley MRN: [id number] Date of Birth: 1941/06/11  [facility] of [facility]) CM/SW Contact:    Zenon Mayo, RN Phone Number: 09/09/2019, 7:50 PM  Clinical Narrative:                 Patient is from home alone, she uses a walker, has a cane also. Presents with symtomatic anemia , PNA. Began having some bright red vomiting, rapid called. She was transferred to 4N.  conts on iv abx. PT eval rec HHPT, may need HHaide also, will need to be set up.  TOC team will continue to follow for dc needs.  Expected Discharge Plan: [place] Barriers to Discharge: Continued Medical Work up   Patient Goals and CMS Choice        Expected Discharge Plan and Services Expected Discharge Plan: [place] In-house Referral: NA Discharge Planning Services: CM Consult   Living arrangements for the past 2 months: Single Family Home                                      Prior Living Arrangements/Services Living arrangements for the past 2 months: Single Family Home Lives with:: Self Patient language and need for interpreter reviewed:: Yes        Need for Family Participation in Patient Care: No (Comment) Care giver support system in place?: No (comment) Current home services: DME(has cane and rolling walker) Criminal Activity/Legal Involvement Pertinent to Current Situation/Hospitalization: No - Comment as needed  Activities of Daily Living Home Assistive Devices/Equipment: None ADL Screening (condition at time of admission) Patient's cognitive ability adequate to safely complete daily activities?: Yes Is the patient deaf or have difficulty hearing?: No Does the patient have difficulty seeing, even when wearing glasses/contacts?: No Does the patient have difficulty concentrating, remembering, or making decisions?: No Patient able to express need for assistance  with ADLs?: Yes Does the patient have difficulty dressing or bathing?: No Independently performs ADLs?: Yes (appropriate for developmental age) Does the patient have difficulty walking or climbing stairs?: Yes Weakness of Legs: None Weakness of Arms/Hands: None  Permission Sought/Granted                  Emotional Assessment       Orientation: : Oriented to Self, Oriented to Place, Oriented to  Time, Oriented to Situation      Admission diagnosis:  Weakness [R53.1] Hypoxia [R09.02] Symptomatic anemia [D64.9] Anemia, unspecified type [D64.9] Acute on chronic congestive heart failure, unspecified heart failure type ([place]) [I50.9] Patient Active Problem List   Diagnosis Date Noted  . Symptomatic anemia 09/05/2019  . Elevated troponin 09/05/2019  . Community acquired pneumonia 09/05/2019  . Iron deficiency anemia 07/10/2017  . Vitamin B12 deficiency 04/15/2016  . Goals of care, counseling/discussion 04/15/2016  . 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 Emphysema/COPD ([place]) 01/15/2016  . Chronic diastolic congestive heart failure ([place]) 12/13/2015  . Hypokalemia 11/02/2015  . History of heart disease 11/02/2015  . 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 Incontinence of bowel 08/24/2015  . 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 Severe low back pain 05/10/2015  . Multiple myeloma ([place]) 04/12/2015  . Anemia due to bone marrow failure ([place]) 04/02/2015  . Cramps, extremity 04/01/2015  .  Encounter for chemotherapy management 04/01/2015  . Hematoma of hip, right, sequela 04/01/2015  . Chronic renal disease, stage 3, moderately decreased glomerular filtration rate between 30-59 mL/min/1.73 square meter 02/12/2015  . Elevated serum protein level 02/12/2015  . Hypoalbuminemia 02/12/2015  . Dyspnea on exertion 04/29/2014  . Dyspepsia 05/19/2011  . CAD (coronary artery disease)   . Hypertension   .  Hyperlipidemia    PCP:  [name], MD Pharmacy:   [facility] [place] [address] Phone: [phone] Fax: [place], [place] - [address] [place] [address] Phone: [phone] Fax: [phone]     Social Determinants of Health (SDOH) Interventions    Readmission Risk Interventions No flowsheet data found.

## 2019-09-09 NOTE — Progress Notes (Signed)
PROGRESS NOTE    Carol Bradley  UYQ:[id number] DOB: [birth year] DOA: 09/05/2019 PCP: [name], MD     Brief Narrative:  Carol Bradley is a 79 y.o. 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  Patient presents with 7-day history of worsening weakness with shortness of breath, nausea, vomiting.  She was seen and evaluated at the cancer center, found to be hypoxic to 56% on room air and was found to be quite anemic with hemoglobin of 7.8.  Her symptoms are worse with ambulation and exertion and improved with rest. She received 1u pRBC with improvement in anemia.   New events last 24 hours / Subjective: Had an episode earlier this morning with shortness of breath.  Received breathing treatment at that time.  This morning, states that she feels her breathing is not as good as it was.  Remains on 3 L nasal cannula O2 this morning.  Assessment & Plan:   Principal Problem:   Symptomatic anemia Active Problems:   CAD (coronary artery disease)   Hypertension   Multiple myeloma (HCC)   Emphysema/COPD (HCC)   Anemia due to bone marrow failure (HCC)   Chronic diastolic congestive heart failure (HCC)   Chronic renal disease, stage 3, moderately decreased glomerular filtration rate between 30-59 mL/min/1.73 square meter   Elevated troponin   Community acquired pneumonia    Symptomatic anemia secondary to MM -Baseline hemoglobin appears to be around 9-10 -Status post 1 unit packed red blood cell 2/27 with improvement in hemoglobin  -Trend CBC.  Hemoglobin stable 9.6 this morning  Acute hypoxemic respiratory failure -Currently requiring 3 L nasal cannula O2, continue to wean as able back to room air  Acute on chronic diastolic heart failure -Echocardiogram 2/26: EF 55 to 60%, with grade 1 diastolic dysfunction -BNP 1489 -Continue to monitor strict I's and  O's, daily weight -Chest x-ray reviewed independently on 3/2.  Shows bilateral pleural effusion with diffuse interstitial edema -IV Lasix ordered today  CAP -Procalcitonin 0.35 -Influenza and COVID-19 negative. Patient due for her second COVID vaccine on 3/6 (Moderna) -Chest x-ray 2/26: Bilateral diffuse interstitial thickening which may reflect interstitial edema versus multilobar pneumonia -Blood cultures negative to date -Rocephin, azithromycin   AKI on CKD stage 3a -Baseline creatinine around 1.3-1.4 -Creatinine improving 1.19 today  Elevated troponin -EDP discussed with Cardiology at time of admission, thought it was secondary to HF and not ACS. Did not recommend IV heparin at this time -Peak at 1481 and trended down   -Denies CP on exam  -Echocardiogram did not reveal regional wall motion abnormalities  MM -Followed by Dr. Delton Coombes  CAD status post PCI/DES in 2012 -Continue aspirin, Lipitor, Lopressor dose decreased due to mild hypotension  Essential hypertension -Continue Cardizem, Lopressor doses decreased due to mild hypotension    DVT prophylaxis: Lovenox Code Status: Full  Family Communication: No family at bedside; discussed with son over the phone Disposition Plan:  . Patient is from home prior to admission. . Currently in-hospital treatment needed due to respiratory failure. Continue to wean nasal cannula O2 to room air.  . Suspect patient will discharge home with home health in 2-3 days.   Consultants:   None  Procedures:   None   Antimicrobials:  Anti-infectives (From admission, onward)   Start     Dose/Rate Route Frequency Ordered Stop   09/06/19 1030  azithromycin (ZITHROMAX) 500  mg in sodium chloride 0.9 % 250 mL IVPB     500 mg 250 mL/hr over 60 Minutes Intravenous Every 24 hours 09/05/19 2140 09/11/19 1029   09/06/19 1000  cefTRIAXone (ROCEPHIN) 2 g in sodium chloride 0.9 % 100 mL IVPB     2 g 200 mL/hr over 30 Minutes Intravenous Every  24 hours 09/05/19 2140 09/11/19 0959   09/05/19 2200  acyclovir (ZOVIRAX) 200 MG capsule 200 mg     200 mg Oral 2 times daily 09/05/19 2140     09/05/19 1515  cefTRIAXone (ROCEPHIN) 1 g in sodium chloride 0.9 % 100 mL IVPB     1 g 200 mL/hr over 30 Minutes Intravenous  Once 09/05/19 1458 09/05/19 1608   09/05/19 1500  azithromycin (ZITHROMAX) tablet 500 mg     500 mg Oral  Once 09/05/19 1458 09/05/19 1538       Objective: Vitals:   09/09/19 0458 09/09/19 0459 09/09/19 0741 09/09/19 0806  BP: (!) 108/49  122/66   Pulse: 90  (!) 103   Resp: 18  18   Temp: 98.2 F (36.8 C)     TempSrc: Oral     SpO2: 93%  93% 92%  Weight:  74.5 kg    Height:        Intake/Output Summary (Last 24 hours) at 09/09/2019 1127 Last data filed at 09/09/2019 0841 Gross per 24 hour  Intake 940 ml  Output 800 ml  Net 140 ml   Filed Weights   09/07/19 0447 09/08/19 0300 09/09/19 0459  Weight: 75.3 kg 74.5 kg 74.5 kg    Examination: General exam: Appears calm and comfortable  Respiratory system: Diminished breath sounds bibasilar, clear to auscultation otherwise.  Does not appear to be in any respiratory distress, no conversational dyspnea noted Cardiovascular system: S1 & S2 heard, RRR. No pedal edema. Gastrointestinal system: Abdomen is nondistended, soft and nontender. Normal bowel sounds heard. Central nervous system: Alert and oriented. Non focal exam. Speech clear  Extremities: Symmetric in appearance bilaterally  Skin: No rashes, lesions or ulcers on exposed skin  Psychiatry: Judgement and insight appear stable. Mood & affect appropriate.   Data Reviewed: I have personally reviewed following labs and imaging studies  CBC: Recent Labs  Lab 09/05/19 1052 09/06/19 0805 09/07/19 0146 09/08/19 0331 09/09/19 0420  WBC 13.1* 8.3 6.9 7.0 6.5  NEUTROABS 11.2*  --   --   --   --   HGB 7.8* 7.7* 9.4* 9.1* 9.6*  HCT 26.7* 24.8* 29.9* 29.4* 30.8*  MCV 112.7* 106.9* 99.0 101.0* 101.3*  PLT 470*  441* 436* 407* 509*   Basic Metabolic Panel: Recent Labs  Lab 09/05/19 1052 09/06/19 0342 09/07/19 0146 09/08/19 0331 09/09/19 0420  NA 139 141 142 142 141  K 4.7 3.6 3.5 4.0 4.4  CL 108 106 104 105 109  CO2 21* '24 27 26 23  '$ GLUCOSE 129* 111* 125* 107* 98  BUN 24* '20 18 15 13  '$ CREATININE 1.45* 1.84* 1.64* 1.53* 1.19*  CALCIUM 7.8* 7.4* 7.6* 7.8* 8.0*   GFR: Estimated Creatinine Clearance: 40.2 mL/min (A) (by C-G formula based on SCr of 1.19 mg/dL (H)). Liver Function Tests: Recent Labs  Lab 09/05/19 1052  AST 21  ALT 13  ALKPHOS 77  BILITOT 0.6  PROT 5.7*  ALBUMIN 2.3*   No results for input(s): LIPASE, AMYLASE in the last 168 hours. No results for input(s): AMMONIA in the last 168 hours. Coagulation Profile: No results for input(s): INR,  PROTIME in the last 168 hours. Cardiac Enzymes: No results for input(s): CKTOTAL, CKMB, CKMBINDEX, TROPONINI in the last 168 hours. BNP (last 3 results) No results for input(s): PROBNP in the last 8760 hours. HbA1C: No results for input(s): HGBA1C in the last 72 hours. CBG: No results for input(s): GLUCAP in the last 168 hours. Lipid Profile: No results for input(s): CHOL, HDL, LDLCALC, TRIG, CHOLHDL, LDLDIRECT in the last 72 hours. Thyroid Function Tests: No results for input(s): TSH, T4TOTAL, FREET4, T3FREE, THYROIDAB in the last 72 hours. Anemia Panel: No results for input(s): VITAMINB12, FOLATE, FERRITIN, TIBC, IRON, RETICCTPCT in the last 72 hours. Sepsis Labs: Recent Labs  Lab 09/05/19 1509  PROCALCITON 0.35    Recent Results (from the past 240 hour(s))  Respiratory Panel by RT PCR (Flu A&B, Covid) - Nasopharyngeal Swab     Status: None   Collection Time: 09/05/19  2:57 PM   Specimen: Nasopharyngeal Swab  Result Value Ref Range Status   SARS Coronavirus 2 by RT PCR NEGATIVE NEGATIVE Final    Comment: (NOTE) SARS-CoV-2 target nucleic acids are NOT DETECTED. The SARS-CoV-2 RNA is generally detectable in upper  respiratoy specimens during the acute phase of infection. The lowest concentration of SARS-CoV-2 viral copies this assay can detect is 131 copies/mL. A negative result does not preclude SARS-Cov-2 infection and should not be used as the sole basis for treatment or other patient management decisions. A negative result may occur with  improper specimen collection/handling, submission of specimen other than nasopharyngeal swab, presence of viral mutation(s) within the areas targeted by this assay, and inadequate number of viral copies (<131 copies/mL). A negative result must be combined with clinical observations, patient history, and epidemiological information. The expected result is Negative. Fact Sheet for Patients:  PinkCheek.be Fact Sheet for Healthcare Providers:  GravelBags.it This test is not yet ap proved or cleared by the [place] FDA and  has been authorized for detection and/or diagnosis of SARS-CoV-2 by FDA under an Emergency Use Authorization (EUA). This EUA will remain  in effect (meaning this test can be used) for the duration of the COVID-19 declaration under Section 564(b)(1) of the Act, 21 U.S.C. section 360bbb-3(b)(1), unless the authorization is terminated or revoked sooner.    Influenza A by PCR NEGATIVE NEGATIVE Final   Influenza B by PCR NEGATIVE NEGATIVE Final    Comment: (NOTE) The Xpert Xpress SARS-CoV-2/FLU/RSV assay is intended as an aid in  the diagnosis of influenza from Nasopharyngeal swab specimens and  should not be used as a sole basis for treatment. Nasal washings and  aspirates are unacceptable for Xpert Xpress SARS-CoV-2/FLU/RSV  testing. Fact Sheet for Patients: PinkCheek.be Fact Sheet for Healthcare Providers: GravelBags.it This test is not yet approved or cleared by the [place] FDA and  has been authorized for  detection and/or diagnosis of SARS-CoV-2 by  FDA under an Emergency Use Authorization (EUA). This EUA will remain  in effect (meaning this test can be used) for the duration of the  Covid-19 declaration under Section 564(b)(1) of the Act, 21  U.S.C. section 360bbb-3(b)(1), unless the authorization is  terminated or revoked. Performed at [facility], [address] [place], [place] [address]   Blood culture (routine x 2)     Status: None (Preliminary result)   Collection Time: 09/05/19  4:38 PM   Specimen: BLOOD RIGHT HAND  Result Value Ref Range Status   Specimen Description BLOOD RIGHT HAND  Final   Special Requests   Final  BOTTLES DRAWN AEROBIC AND ANAEROBIC Blood Culture adequate volume   Culture   Final    NO GROWTH 4 DAYS Performed at [facility], [address], [place], [place] [address]    Report Status PENDING  Incomplete  Blood culture (routine x 2)     Status: None (Preliminary result)   Collection Time: 09/05/19  4:39 PM   Specimen: Left Antecubital; Blood  Result Value Ref Range Status   Specimen Description LEFT ANTECUBITAL  Final   Special Requests   Final    BOTTLES DRAWN AEROBIC AND ANAEROBIC Blood Culture adequate volume   Culture   Final    NO GROWTH 4 DAYS Performed at [facility], [address], [place], [place] [address]    Report Status PENDING  Incomplete      Radiology Studies: DG Chest 2 View  Result Date: 09/09/2019 CLINICAL DATA:  Acute respiratory failure. EXAM: CHEST - 2 VIEW COMPARISON:  Chest radiograph 09/05/2019. FINDINGS: The cardiomediastinal silhouette is unchanged. Bilateral pleural effusions (small right, trace left). Ill-defined opacity within the lateral right lung base is new from prior examination and may reflect atelectasis or consolidation. Similar appearance of diffuse bilateral interstitial prominence. No evidence of pneumothorax. No acute bony abnormality. Partially visualized ACDF hardware. Overlying cardiac monitoring leads.  IMPRESSION: Bilateral pleural effusions (small right, trace left). Persistent diffuse bilateral interstitial prominence likely reflecting a component of interstitial edema. Ill-defined opacity within the lateral right lung base, new from prior exam and possibly reflecting atelectasis. Pneumonia cannot be excluded. Electronically Signed   By: Kellie Simmering DO   On: 09/09/2019 09:36      Scheduled Meds: . acyclovir  200 mg Oral BID  . aspirin EC  81 mg Oral Daily  . atorvastatin  10 mg Oral q1800  . diltiazem  120 mg Oral Daily  . enoxaparin (LOVENOX) injection  40 mg Subcutaneous Q24H  . fluticasone  2 spray Each Nare Daily  . furosemide  40 mg Intravenous Once  . metoprolol tartrate  12.5 mg Oral BID  . montelukast  10 mg Oral QHS  . pantoprazole  40 mg Oral Daily  . potassium chloride  10 mEq Oral TID  . sodium chloride flush  3 mL Intravenous Q12H  . umeclidinium-vilanterol  1 puff Inhalation Daily   Continuous Infusions: . sodium chloride    . azithromycin 500 mg (09/09/19 0933)  . cefTRIAXone (ROCEPHIN)  IV 2 g (09/09/19 0836)     LOS: 4 days      Time spent: 25 minutes   Dessa Phi, DO [facility] 09/09/2019, 11:27 AM   Available via Epic secure chat 7am-7pm After these hours, please refer to coverage provider listed on [email or web address]

## 2019-09-09 NOTE — Progress Notes (Addendum)
  PROGRESS NOTE  Called by RN this afternoon. Initially reported patient coughing up blood-tinged sputum, then later was vomiting bright red blood. Patient seen and examined. Son, rapid response RN, bedside RN present. Patient reports nausea, unrelieved with zofran. She appears pale and overall ill appearing. BP was in the 80s but improved to 92/49 with IVF bolus. Denies any blood in stool.   -Stop baby aspirin, lovenox for DVT ppx -IVF bolus  -CT chest ordered initially thought it may be related to her underlying pulmonary process (CAP, CHF) and maybe ?alveolar hemorrhage, but it sounds like now it may be more GI in etiology as she has been seen dry heaving and vomiting blood  -Consult GI  -Phenergan for nausea -Check H&H --> Hgb 8.5  -PPI IV  -NPO -Stat type & cross. Patient has antibody reactivity which has delayed blood product transfusion in the past, as sample had to be sent to [facility]. Support BP with IVF until we are able to get pRBC.   Addendum: Patient received IVF boluses with some improvement in BP but once fluid complete, dropped to SBP 50. Consult PCCM.    Dessa Phi, DO [facility] 09/09/2019, 2:59 PM  Available via Epic secure chat 7am-7pm After these hours, please refer to coverage provider listed on [email or web address]

## 2019-09-09 NOTE — Progress Notes (Signed)
Went to check pt. the patient is now vomiting bright red blood. MD paged made aware. Will come to see pt.

## 2019-09-09 NOTE — Consult Note (Signed)
Reason for Consult: Questionable GI blood loss Referring Physician: Hospital team  Carol Bradley is an 80 y.o. female.  HPI: Patient seen and examined in hospital computer chart reviewed and case discussed with the hospital team and she has had a few episodes of very minimal hemoptysis versus hematemesis which sounds more like she has been coughing up blood-tinged sputum without signs of GI bleeding in her stools or any melena however she is hypotensive but did get some diuretics and is not having any specific GI complaints and has had a colonoscopy in the past but no previous upper tract work-up but has been on an aspirin a day and she was on steroids with her chemotherapy but not recently and no nonsteroidals at home  Past Medical History:  Diagnosis Date  . 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 CAD (coronary artery disease)   . COPD (chronic obstructive pulmonary disease) ([place])   . History of tobacco abuse   . Hyperlipidemia   . Hypertension   . 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 Multiple myeloma ([place])   . Vitamin B12 deficiency 04/15/2016   Overview:  Vitamin B12 level documented 155, January 2016 with the normal range being 211-924    Past Surgical History:  Procedure Laterality Date  . BACK SURGERY    . CARDIAC CATHETERIZATION  04/2011   right and left cath showing normal right heart pressures,but newly diagnosed coronary artery disease/drug eluting stent placed to RCA with residual disease in the proximal RCA and LAD and ramus, normal LV function and 60-65% EF  . COLONOSCOPY N/A 09/30/2014   Procedure: COLONOSCOPY;  Surgeon: Rogene Houston, MD;  Location: [facility];  Service: Endoscopy;  Laterality: N/A;  225  . CORONARY ANGIOPLASTY WITH STENT PLACEMENT  04/2011   mid RCA: 3.0 X38 mm Promus DES. Residual 40% disease proximally  . NECK SURGERY      Family History  Problem Relation Age of Onset  . Heart failure Mother   . Cancer Father      Social History:  reports that she quit smoking about 25 years ago. Her smoking use included cigarettes. She has a 75.00 pack-year smoking history. She has never used smokeless tobacco. She reports that she does not drink alcohol or use drugs.  Allergies:  Allergies  Allergen Reactions  . Lisinopril Cough  . Plavix [Clopidogrel Bisulfate] Swelling    Medications: I have reviewed the patient's current medications.  Results for orders placed or performed during the hospital encounter of 09/05/19 (from the past 48 hour(s))  Basic metabolic panel     Status: Abnormal   Collection Time: 09/08/19  3:31 AM  Result Value Ref Range   Sodium 142 135 - 145 mmol/L   Potassium 4.0 3.5 - 5.1 mmol/L   Chloride 105 98 - 111 mmol/L   CO2 26 22 - 32 mmol/L   Glucose, Bld 107 (H) 70 - 99 mg/dL    Comment: Glucose reference range applies only to samples taken after fasting for at least 8 hours.   BUN 15 8 - 23 mg/dL   Creatinine, Ser 1.53 (H) 0.44 - 1.00 mg/dL   Calcium 7.8 (L) 8.9 - 10.3 mg/dL   GFR calc non Af Amer 32 (L) >60 mL/min   GFR calc Af Amer 37 (L) >60 mL/min   Anion gap 11 5 - 15    Comment: Performed at [facility] [address] [place], [place] [address]  CBC  Status: Abnormal   Collection Time: 09/08/19  3:31 AM  Result Value Ref Range   WBC 7.0 4.0 - 10.5 K/uL   RBC 2.91 (L) 3.87 - 5.11 MIL/uL   Hemoglobin 9.1 (L) 12.0 - 15.0 g/dL   HCT 29.4 (L) 36.0 - 46.0 %   MCV 101.0 (H) 80.0 - 100.0 fL   MCH 31.3 26.0 - 34.0 pg   MCHC 31.0 30.0 - 36.0 g/dL   RDW 24.5 (H) 11.5 - 15.5 %   Platelets 407 (H) 150 - 400 K/uL   nRBC 0.0 0.0 - 0.2 %    Comment: Performed at [facility] [address] [place], [place] [address]  Basic metabolic panel     Status: Abnormal   Collection Time: 09/09/19  4:20 AM  Result Value Ref Range   Sodium 141 135 - 145 mmol/L   Potassium 4.4 3.5 - 5.1 mmol/L   Chloride 109 98 - 111 mmol/L   CO2 23 22 - 32 mmol/L   Glucose, Bld 98  70 - 99 mg/dL    Comment: Glucose reference range applies only to samples taken after fasting for at least 8 hours.   BUN 13 8 - 23 mg/dL   Creatinine, Ser 1.19 (H) 0.44 - 1.00 mg/dL   Calcium 8.0 (L) 8.9 - 10.3 mg/dL   GFR calc non Af Amer 44 (L) >60 mL/min   GFR calc Af Amer 51 (L) >60 mL/min   Anion gap 9 5 - 15    Comment: Performed at [facility] [address] [place], [place] [address]  CBC     Status: Abnormal   Collection Time: 09/09/19  4:20 AM  Result Value Ref Range   WBC 6.5 4.0 - 10.5 K/uL   RBC 3.04 (L) 3.87 - 5.11 MIL/uL   Hemoglobin 9.6 (L) 12.0 - 15.0 g/dL   HCT 30.8 (L) 36.0 - 46.0 %   MCV 101.3 (H) 80.0 - 100.0 fL   MCH 31.6 26.0 - 34.0 pg   MCHC 31.2 30.0 - 36.0 g/dL   RDW 23.6 (H) 11.5 - 15.5 %   Platelets 421 (H) 150 - 400 K/uL   nRBC 0.0 0.0 - 0.2 %    Comment: Performed at [facility] [address] [place], [place] [address]  Hemoglobin and hematocrit, blood     Status: Abnormal   Collection Time: 09/09/19  3:13 PM  Result Value Ref Range   Hemoglobin 8.5 (L) 12.0 - 15.0 g/dL   HCT 28.6 (L) 36.0 - 46.0 %    Comment: Performed at [facility], [place] [address], [place], [place] [address]  Prepare RBC     Status: None   Collection Time: 09/09/19  3:41 PM  Result Value Ref Range   Order Confirmation      ORDER PROCESSED BY BLOOD BANK Performed at [facility], [place] [address] [place], [place] [address]   Type and screen [place]     Status: None (Preliminary result)   Collection Time: 09/09/19  3:41 PM  Result Value Ref Range   ABO/RH(D) O POS    Antibody Screen POS    Sample Expiration 09/12/2019,2359    Antibody Identification      NON SPECIFIC ANTIBODY REACTIVITY Performed at [facility], [place] [address], [place], [place] [address]    Unit Number [id number]    Blood Component Type RED CELLS,LR    Unit division  00    Status of Unit ALLOCATED    Donor AG Type      NEGATIVE FOR E ANTIGEN  NEGATIVE FOR c ANTIGEN NEGATIVE FOR KELL ANTIGEN NEGATIVE FOR S ANTIGEN NEGATIVE FOR KIDD A ANTIGEN   Transfusion Status OK TO TRANSFUSE    Crossmatch Result LEAST INCOMPATIBLE   Glucose, capillary     Status: Abnormal   Collection Time: 09/09/19  3:51 PM  Result Value Ref Range   Glucose-Capillary 125 (H) 70 - 99 mg/dL    Comment: Glucose reference range applies only to samples taken after fasting for at least 8 hours.    DG Chest 2 View  Result Date: 09/09/2019 CLINICAL DATA:  Acute respiratory failure. EXAM: CHEST - 2 VIEW COMPARISON:  Chest radiograph 09/05/2019. FINDINGS: The cardiomediastinal silhouette is unchanged. Bilateral pleural effusions (small right, trace left). Ill-defined opacity within the lateral right lung base is new from prior examination and may reflect atelectasis or consolidation. Similar appearance of diffuse bilateral interstitial prominence. No evidence of pneumothorax. No acute bony abnormality. Partially visualized ACDF hardware. Overlying cardiac monitoring leads. IMPRESSION: Bilateral pleural effusions (small right, trace left). Persistent diffuse bilateral interstitial prominence likely reflecting a component of interstitial edema. Ill-defined opacity within the lateral right lung base, new from prior exam and possibly reflecting atelectasis. Pneumonia cannot be excluded. Electronically Signed   By: Kellie Simmering DO   On: 09/09/2019 09:36    Review of Systems currently she is doing fine in the ICU without any specific GI complaints and her last bowel movement was brown Blood pressure (!) 101/44, pulse (!) 112, temperature (!) 97.4 F (36.3 C), temperature source Oral, resp. rate 19, height '5\' 6"'$  (1.676 m), weight 74.5 kg, SpO2 95 %. Physical Exam her blood pressure is actually better in the ICU no acute distress in okay spirits exam pertinent for abdomen being soft nontender hemoglobin slight drop BUN normal  Assessment/Plan: Questionable GI blood loss probable  hemoptysis doubt hematemesis Plan: Please call us this evening if signs of active bleeding for urgent endoscopy otherwise have tentatively scheduled her for 1230 tomorrow and the risks of the procedure was discussed  [facility] E 09/09/2019, 6:39 PM

## 2019-09-09 NOTE — Progress Notes (Signed)
PT woke up c/o of sob. Pt stated it just started. Lungs sounds clear. O2 saturation 90% on 4L [place]. On call provider notified. Awaiting response.

## 2019-09-09 NOTE — Progress Notes (Signed)
PRN breathing treatment administered. If no improvement, call provider back.

## 2019-09-09 NOTE — Consult Note (Addendum)
NAME:  Carol Bradley, MRN:  [id number], DOB:  1940-11-07, LOS: 4 ADMISSION DATE:  09/05/2019, CONSULTATION DATE:  09/09/2019 REFERRING MD:  Dr. Maylene Roes, CHIEF COMPLAINT:  Hypotension  Brief History   79 year old female presenting with N/V and SOB found hypoxic and acute on chronic anemic being treated for CAP and HFpEF.  Transfused 1 unit on 2/27.  Being treated with empiric antibiotics and diuresed.  On 3/2 she developed worsening nausea with several bloody emesis with hypotension.  ASA and lovenox since stopped and transitioned to IV protonix.  Hypotension improved after 2 L NS.  Patient has multiple antibodies on T&C.  GI consulted and PCCM consulted.    History of present illness   79 year old female with prior history of multiple myeloma, anemia, COPD, CAD, HTN, and CKD stage III who presented on 2/26 with shortness of breath, nausea, and vomiting found to be hypoxic on room air at 56% and anemic with Hgb of 7.8.  She was admitted to [facility] being treated for anemia, heart failure and pneumonia.  She was transfused 1 unit of PRBC on 2/27. COVID negative, PCT 0.35 and CXR noted to have bilateral diffuse interstitial thickening though to reflect either interstitial edema versus multilobar pneumonia.  She was started on ceftriaxone and azithromycin.  She was noted to have elevated troponin's however TTE on 2/26 did not show any wall motion abnormalities; EF noted 55-60% and noted for G1DD.  She has been diuresed.  On 3/2 she developed worsening nausea with several bloody emesis estimated ~ 200 ml with hypotension.  ASA and lovenox since stopped and transitioned to IV protonix.  Hypotension improved after 2 L NS. Since IVFs, blood pressure has since stabilized.  She continues to complain of nausea.  Noted to have some retching on floor Patient has multiple antibodies on T&C.  GI consulted and PCCM consulted, and transferred to ICU.    Past Medical History  CAD, CKD stage III, HTN, multiple myeloma, anemia, COPD,  HFpEF  Significant Hospital Events   2/26 admitted   Consults:  GI  Procedures:   Significant Diagnostic Tests:  2/26 TTE >> 1. Left ventricular ejection fraction, by estimation, is 55 to 60%. The  left ventricle has normal function. The left ventricle has no regional  wall motion abnormalities. There is mild concentric left ventricular  hypertrophy. Left ventricular diastolic  parameters are consistent with Grade I diastolic dysfunction (impaired  relaxation).  2. Right ventricular systolic function is normal. The right ventricular  size is mildly enlarged. There is mildly elevated pulmonary artery  systolic pressure.  3. Right atrial size was mildly dilated.  4. The mitral valve is degenerative. Moderate mitral valve regurgitation.  5. Tricuspid valve regurgitation is mild to moderate.  6. The aortic valve is tricuspid. Aortic valve regurgitation is mild.  Mild aortic valve sclerosis is present, with no evidence of aortic valve  stenosis.  7. The inferior vena cava is normal in size with greater than 50%  respiratory variability, suggesting right atrial pressure of 3 mmHg.   Micro Data:  2/26 SARS 2 >> neg 2/26 BC x 2 >>   Antimicrobials:  2/27 acyclovir >> 2/27 azithromycin >> 2/27 ceftriaxone >>  Interim history/subjective:  Ongoing nausea.  No complains of worsening SOB or chest pain.  Denies abdominal pain.  Formed brown stool noted on arrival to ICU.   Objective   Blood pressure (!) 111/44, pulse (!) 107, temperature (!) 97.4 F (36.3 C), temperature source  Oral, resp. rate 20, height _0  (1.676 m), weight 74.5 kg, SpO2 95 %.        Intake/Output Summary (Last 24 hours) at 09/09/2019 1729 Last data filed at 09/09/2019 1500 Gross per 24 hour  Intake 820 ml  Output 1100 ml  Net -280 ml   Filed Weights   09/07/19 0447 09/08/19 0300 09/09/19 0459  Weight: 75.3 kg 74.5 kg 74.5 kg    Examination: General:  Elderly female lying in bed, appears not to  feel well.  HEENT: MM pale/moist, pupils 3/reactive, anicteric  Neuro: Alert, oriented x 3, MAE CV: ST, no murmur PULM:  Non labored, CTA, on [place], no wheeze GI: obese, soft, NT/ ND, hyperBS Extremities: warm/dry, no LE Skin: no rashes   Resolved Hospital Problem list    Assessment & Plan:   Concern for hemorraghic shock from hematemesis/ suspected UGIB P:  tx to ICU for close monitoring NPO  GI plans for upper endoscopy tomorrow around noon or sooner if she decompensates  No need for PPI drip per GI, BID ok  MAP goal > 65 Repeat H/H at 2000  (one unit is ready but will hold given BP has stabilized and no further episodes) Send stool for occult  Strict I/O, monitor UOP CBC / BMET in am  zofran prn, monitor QTc hold if > 500  Acute on chronic anemia (baseline 9-10) - patient has multiple antibodies on T&C and takes time to find P:  Transfuse for Hgb > 7 H/H at 2000  Acute hypoxemic respiratory failure  CAP  Hx COPD, doubt AE - has remained afebrile, normal WBC P:  Supplemental O2 for sat goal >92 Azithro/ ceftriaxone for 5 days total Encourage pulmonary toliet Continue nebs/ anoro ellipta  AKI on CKD stage III - sCr improving  P:  Trend UOP/ BMET  HTN  Acute on chronic diastolic heart failure CAD s/p PCI/ DES in 2012 - G1DD, EF 55-60%, no regional wall abnormalites P:  Tele monitoring Holding meds while NPO, r/o UGIB, and hypotensive- lipitor, ASA, lopressor Hold any further diuresis  Daily wt/ strict I/Os  Multiple Myeloma  P:  Followed by Dr. Delton Coombes Continue chronic acyclovir   Best practice:  Diet: NPO Pain/Anxiety/Delirium protocol (if indicated): n/a VAP protocol (if indicated): n/a DVT prophylaxis: SCDs only  GI prophylaxis: PPI BID Glucose control: trend on BMET Mobility: BR Code Status: Full  Family Communication: son updated at bedside on plan of care by Dr. Loanne Drilling Disposition: tx to ICU   Labs   CBC: Recent Labs  Lab  09/05/19 1052 09/05/19 1052 09/06/19 0805 09/07/19 0146 09/08/19 0331 09/09/19 0420 09/09/19 1513  WBC 13.1*  --  8.3 6.9 7.0 6.5  --   NEUTROABS 11.2*  --   --   --   --   --   --   HGB 7.8*   < > 7.7* 9.4* 9.1* 9.6* 8.5*  HCT 26.7*   < > 24.8* 29.9* 29.4* 30.8* 28.6*  MCV 112.7*  --  106.9* 99.0 101.0* 101.3*  --   PLT 470*  --  441* 436* 407* 421*  --    < > = values in this interval not displayed.    Basic Metabolic Panel: Recent Labs  Lab 09/05/19 1052 09/06/19 0342 09/07/19 0146 09/08/19 0331 09/09/19 0420  NA 139 141 142 142 141  K 4.7 3.6 3.5 4.0 4.4  CL 108 106 104 105 109  CO2 21* _1 GLUCOSE 129*  111* 125* 107* 98  BUN 24* _0 CREATININE 1.45* 1.84* 1.64* 1.53* 1.19*  CALCIUM 7.8* 7.4* 7.6* 7.8* 8.0*   GFR: Estimated Creatinine Clearance: 40.2 mL/min (A) (by C-G formula based on SCr of 1.19 mg/dL (H)). Recent Labs  Lab 09/05/19 1052 09/05/19 1509 09/06/19 0805 09/07/19 0146 09/08/19 0331 09/09/19 0420  PROCALCITON  --  0.35  --   --   --   --   WBC   < >  --  8.3 6.9 7.0 6.5   < > = values in this interval not displayed.    Liver Function Tests: Recent Labs  Lab 09/05/19 1052  AST 21  ALT 13  ALKPHOS 77  BILITOT 0.6  PROT 5.7*  ALBUMIN 2.3*   No results for input(s): LIPASE, AMYLASE in the last 168 hours. No results for input(s): AMMONIA in the last 168 hours.  ABG    Component Value Date/Time   PHART 7.384 05/03/2011 1023   PCO2ART 39.2 05/03/2011 1023   PO2ART 63.0 (L) 05/03/2011 1023   HCO3 25.3 (H) 05/03/2011 1052   TCO2 27 05/03/2011 1052   ACIDBASEDEF 3.0 (H) 05/03/2011 1027   O2SAT 58.0 05/03/2011 1052     Coagulation Profile: No results for input(s): INR, PROTIME in the last 168 hours.  Cardiac Enzymes: No results for input(s): CKTOTAL, CKMB, CKMBINDEX, TROPONINI in the last 168 hours.  HbA1C: No results found for: HGBA1C  CBG: Recent Labs  Lab 09/09/19 1551  GLUCAP 125*    Review of  Systems:   Review of Systems  Constitutional: Negative for chills and fever.  Respiratory: Negative for cough, hemoptysis, sputum production, shortness of breath and wheezing.   Cardiovascular: Negative for chest pain, orthopnea and leg swelling.  Gastrointestinal: Positive for nausea and vomiting. Negative for abdominal pain, blood in stool and melena.  Neurological: Positive for sensory change and weakness. Negative for dizziness, focal weakness and loss of consciousness.   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   Surgical History    Past Surgical History:  Procedure Laterality Date  . BACK SURGERY    . CARDIAC CATHETERIZATION  04/2011   right and left cath showing normal right heart pressures,but newly diagnosed coronary artery disease/drug eluting stent placed to RCA with residual disease in the proximal RCA and LAD and ramus, normal LV function and 60-65% EF  . COLONOSCOPY N/A 09/30/2014   Procedure: COLONOSCOPY;  Surgeon: Rogene Houston, MD;  Location: [facility];  Service: Endoscopy;  Laterality: N/A;  225  . CORONARY ANGIOPLASTY WITH STENT PLACEMENT  04/2011   mid RCA: 3.0 X38 mm Promus DES. Residual 40% disease proximally  . NECK SURGERY       Social History   reports that she quit smoking about 25 years ago. Her smoking use included cigarettes. She has a 75.00 pack-year smoking history. She has never used smokeless tobacco. She reports that she does not drink alcohol or use drugs.   Family History   Her family history includes Cancer in her father; Heart failure in her mother.   Allergies Allergies  Allergen Reactions  . Lisinopril Cough  . Plavix  [Clopidogrel Bisulfate] Swelling     Home Medications  Prior to Admission medications  Medication Sig Start Date End Date Taking? Authorizing Provider  acyclovir (ZOVIRAX) 200 MG capsule TAKE ONE CAPSULE BY MOUTH TWICE DAILY 06/16/19  Yes Derek Jack, MD  albuterol (VENTOLIN HFA) 108 (90 Base) MCG/ACT inhaler Inhale 2 puffs into the lungs every 6 (six) hours as needed for wheezing or shortness of breath. 07/23/19  Yes Derek Jack, MD  aspirin EC 81 MG tablet Take 1 tablet (81 mg total) by mouth daily. 01/09/17  Yes Martinique, Peter M, MD  atorvastatin (LIPITOR) 10 MG tablet TAKE ONE TABLET BY MOUTH DAILY 12/09/18  Yes [place], Peter M, MD  Calcium Carbonate-Vit D-Min (CALCIUM 600+D PLUS MINERALS) 600-400 MG-UNIT CHEW Chew 1 tablet by mouth 3 (three) times daily.    Yes [provider]  cyanocobalamin (,VITAMIN B-12,) 1000 MCG/ML injection Inject 1,000 mcg into the skin every 30 (thirty) days.    Yes [provider]  daratumumab-hyaluronidase-fihj (DARZALEX FASPRO) 1800-30000 MG-UT/15ML SOLN Inject 1,800 mg into the skin. Weekly weeks 1-8; every 2 weeks weeks 9-24; every 4 weeks after week 24 07/28/19  Yes [provider]  diltiazem (CARDIZEM CD) 240 MG 24 hr capsule Take 1 capsule (240 mg total) by mouth daily. 01/17/19  Yes Martinique, Peter M, MD  fluticasone [facility]) 50 MCG/ACT nasal spray Place 2 sprays into both nostrils as needed.    Yes [provider]  levofloxacin (LEVAQUIN) 250 MG tablet Take 1 tablet (250 mg total) by mouth daily. Take tablets today then one tablet daily starting tomorrow 09/02/19  Yes Derek Jack, MD  magnesium oxide (MAG-OX) 400 (241.3 Mg) MG tablet Take 1 tablet by mouth 2 (two) times daily. 04/10/19  Yes [provider]  metoprolol tartrate (LOPRESSOR) 25 MG tablet Take 1.5 tablets (37.5 mg total) by mouth 2 (two) times daily. 06/13/19  Yes [place], Peter M, MD  ondansetron (ZOFRAN) 8 MG tablet Take 1 tablet (8 mg  total) by mouth every 8 (eight) hours as needed for nausea or vomiting. 09/01/19  Yes Derek Jack, MD  pantoprazole (PROTONIX) 40 MG tablet Take 40 mg by mouth as needed.  09/19/18  Yes [provider]  pomalidomide (POMALYST) 2 MG capsule Take 1 capsule (2 mg total) by mouth daily. Take with water on days 1-21. Repeat every 28 days. 08/06/19  Yes Derek Jack, MD  potassium chloride (K-DUR) 10 MEQ tablet TAKE ONE TABLET BY MOUTH THREE TIMES DAILY. 03/20/19  Yes Lockamy, Randi L, NP-C  umeclidinium-vilanterol (ANORO ELLIPTA) 62.5-25 MCG/INH AEPB Inhale 1 puff into the lungs daily. 03/25/19  Yes Derek Jack, MD  Zoledronic Acid 4 MG SOLR Inject into the vein.   Yes [provider]  dexamethasone (DECADRON) 2 MG tablet Take five tablets (35m) by mouth weekly. Patient taking differently: Take five tablets (159m by mouth bi-weekly. 11/13/18   KaDerek JackMD  diazepam (VALIUM) 5 MG tablet TAKE ONE TABLET BY MOUTH EVERY 6 HOURS AS NEEDED FOR ANXIETY Patient not taking: Reported on 09/01/2019 10/04/18   KaDerek JackMD  furosemide (LASIX) 20 MG tablet Take 1 tablet (20 mg total) by mouth daily as needed (as needed for ankle swelling). Patient not taking: Reported on 09/05/2019 04/07/19   JoMartiniquePeter M, MD  guaiFENesin (MUCINEX) 600 MG 12 hr tablet Take 600 mg 2 (two) times daily as needed by mouth for cough or to loosen phlegm.    [provider]  montelukast (SINGULAIR) 10 MG tablet Take 1 tablet three days prior to your first treatment and the day of treatment. Then  take one tablet on the day of treatment there after. 07/23/19   Derek Jack, MD  polyethylene glycol powder (GLYCOLAX/MIRALAX) 17 GM/SCOOP powder Take by mouth as needed.     [provider]        CRITICAL CARE Performed by: Kennieth Rad   Total critical care time: 55 minutes   Critical care time was exclusive of separately billable procedures and  treating other patients.   Critical care was necessary to treat or prevent imminent or life-threatening deterioration.   Critical care was time spent personally by me on the following activities: development of treatment plan with patient and/or surrogate as well as nursing, discussions with consultants, evaluation of patient's response to treatment, examination of patient, obtaining history from patient or surrogate, ordering and performing treatments and interventions, ordering and review of laboratory studies, ordering and review of radiographic studies, pulse oximetry and re-evaluation of patient's condition.  Kennieth Rad, MSN, AGACNP-BC [facility] 09/09/2019, 5:29 PM

## 2019-09-09 NOTE — Progress Notes (Addendum)
Transfered to 4N32. Son at bedside.

## 2019-09-09 NOTE — Significant Event (Addendum)
Rapid Response Event Note  Overview: Time Called: 1419 Arrival Time: 1425 Event Type: Other (Comment), Hypotension(vomiting blood) BP 80/45, HR 118, SpO2 96% on 3LNC Blood tinged sputum noted in emesis bag, pt belching/dry heaving.  Initial Focused Assessment: Pt lying in bed, awake, alert. Pt pale, cool and diaphoretic. Pt states she feels hot. Pt dry heaving and vomiting blood tinged sputum. Pt remains nauseas, PRN Zofran given at 1035. Approximately 150cc blood tinged sputum with a few small blood clots noted in emesis bag. Pt denies nose bleeds, denies known stomach ulcers/oral ulcers. Pt denies chest pain or stomach pain. Pt has had poor appetite today. Pt was diuresed earlier today.    1530: Following 500cc bolus, pt BP dropped back down to 74/36. Dr. Maylene Roes notified. 500 cc bolus started, order received for 1U PRBC. MD made aware that pt would need new type and screen. GI consult placed and GI PA at bedside to assess pt. Additional 200cc of blood tinged sputum vomited by pt.   1700: BP 55/35, rechecked to 111/44 (confirmed with manual check). Order placed for Levophed gtt. Bolus paused to assess BP response and need for additional IVF or vasopressors.  Interventions: -2L IVF bolus given -Dr. Maylene Roes at bedside to assess pt. Orders received for: H&H, CT chest, IV phenergan, Type & screen, transfuse 1U PRBC. Decision made to d/c CT due to unable to maintain SBP >90. -EKG complete  -PCCM consulted  Plan of Care (if not transferred): -Stay ahead of nausea -Follow up with MD results of tests ordered  Pt transferred to ICU with rapid response RN and primary RN. Pt transferred on telemetry monitoring. VSS stable at time of transport and on arrival to ICU. ICU team at bedside to receive pt.    Event Summary: Name of Physician Notified: Dr. Maylene Roes at 1425   Outcome: Transferred (Comment)  Event End Time: [place]

## 2019-09-10 ENCOUNTER — Inpatient Hospital Stay (HOSPITAL_COMMUNITY): Payer: Medicare Other | Admitting: Anesthesiology

## 2019-09-10 ENCOUNTER — Encounter (HOSPITAL_COMMUNITY)
Admission: EM | Disposition: A | Discharge status: Home Health | Payer: Self-pay | Source: Ambulatory Visit | Attending: Internal Medicine

## 2019-09-10 ENCOUNTER — Encounter (HOSPITAL_COMMUNITY): Payer: Self-pay | Admitting: Internal Medicine

## 2019-09-10 DIAGNOSIS — I959 Hypotension, unspecified: Secondary | ICD-10-CM

## 2019-09-10 DIAGNOSIS — K254 Chronic or unspecified gastric ulcer with hemorrhage: Principal | ICD-10-CM

## 2019-09-10 DIAGNOSIS — K922 Gastrointestinal hemorrhage, unspecified: Secondary | ICD-10-CM

## 2019-09-10 DIAGNOSIS — R578 Other shock: Secondary | ICD-10-CM

## 2019-09-10 DIAGNOSIS — J9600 Acute respiratory failure, unspecified whether with hypoxia or hypercapnia: Secondary | ICD-10-CM

## 2019-09-10 HISTORY — PX: ESOPHAGOGASTRODUODENOSCOPY: SHX5428

## 2019-09-10 LAB — BASIC METABOLIC PANEL
Anion gap: 8 (ref 5–15)
BUN: 40 mg/dL — ABNORMAL HIGH (ref 8–23)
CO2: 24 mmol/L (ref 22–32)
Calcium: 7.5 mg/dL — ABNORMAL LOW (ref 8.9–10.3)
Chloride: 114 mmol/L — ABNORMAL HIGH (ref 98–111)
Creatinine, Ser: 1.2 mg/dL — ABNORMAL HIGH (ref 0.44–1.00)
GFR calc Af Amer: 50 mL/min — ABNORMAL LOW (ref >60)
GFR calc non Af Amer: 43 mL/min — ABNORMAL LOW (ref >60)
Glucose, Bld: 92 mg/dL (ref 70–99)
Potassium: 4.3 mmol/L (ref 3.5–5.1)
Sodium: 146 mmol/L — ABNORMAL HIGH (ref 135–145)

## 2019-09-10 LAB — CBC
HCT: 28.7 % — ABNORMAL LOW (ref 36.0–46.0)
HCT: 25.4 % — ABNORMAL LOW (ref 36.0–46.0)
Hemoglobin: 8.7 g/dL — ABNORMAL LOW (ref 12.0–15.0)
Hemoglobin: 7.7 g/dL — ABNORMAL LOW (ref 12.0–15.0)
MCH: 30.4 pg (ref 26.0–34.0)
MCH: 30.1 pg (ref 26.0–34.0)
MCHC: 30.3 g/dL (ref 30.0–36.0)
MCHC: 30.3 g/dL (ref 30.0–36.0)
MCV: 100.3 fL — ABNORMAL HIGH (ref 80.0–100.0)
MCV: 99.2 fL (ref 80.0–100.0)
Platelets: 300 10*3/uL (ref 150–400)
Platelets: 327 10*3/uL (ref 150–400)
RBC: 2.86 MIL/uL — ABNORMAL LOW (ref 3.87–5.11)
RBC: 2.56 MIL/uL — ABNORMAL LOW (ref 3.87–5.11)
RDW: 23.1 % — ABNORMAL HIGH (ref 11.5–15.5)
RDW: 23 % — ABNORMAL HIGH (ref 11.5–15.5)
WBC: 7.5 10*3/uL (ref 4.0–10.5)
WBC: 6.4 10*3/uL (ref 4.0–10.5)
nRBC: 0.3 % — ABNORMAL HIGH (ref 0.0–0.2)
nRBC: 0.3 % — ABNORMAL HIGH (ref 0.0–0.2)

## 2019-09-10 LAB — CULTURE, BLOOD (ROUTINE X 2)
Culture: NO GROWTH
Culture: NO GROWTH
Special Requests: ADEQUATE
Special Requests: ADEQUATE

## 2019-09-10 LAB — MAGNESIUM: Magnesium: 1.9 mg/dL (ref 1.7–2.4)

## 2019-09-10 LAB — PREPARE RBC (CROSSMATCH)

## 2019-09-10 SURGERY — EGD (ESOPHAGOGASTRODUODENOSCOPY)
Anesthesia: Monitor Anesthesia Care

## 2019-09-10 MED ORDER — PROPOFOL 10 MG/ML IV BOLUS
INTRAVENOUS | Status: DC | PRN
  Administered 2019-09-10: 30 mg via INTRAVENOUS

## 2019-09-10 MED ORDER — PROPOFOL 500 MG/50ML IV EMUL
INTRAVENOUS | Status: DC | PRN
  Administered 2019-09-10: 125 ug/kg/min via INTRAVENOUS

## 2019-09-10 MED ORDER — PHENYLEPHRINE 40 MCG/ML (10ML) SYRINGE FOR IV PUSH (FOR BLOOD PRESSURE SUPPORT)
PREFILLED_SYRINGE | INTRAVENOUS | Status: DC | PRN
  Administered 2019-09-10: 120 ug via INTRAVENOUS

## 2019-09-10 MED ORDER — LACTATED RINGERS IV SOLN
INTRAVENOUS | Status: DC
  Administered 2019-09-10: 1000 mL via INTRAVENOUS

## 2019-09-10 MED ORDER — SODIUM CHLORIDE 0.9% IV SOLUTION: Freq: Once | INTRAVENOUS | Status: DC

## 2019-09-10 NOTE — Progress Notes (Signed)
PT Cancellation Note  Patient Details Name: [name] MRN: [id number] DOB: [birth year]   Cancelled Treatment:    Reason Eval/Treat Not Completed: Patient at procedure or test/unavailable; patient down for EGD, will attempt to see later as time permits and pt able to tolerate.    Reginia Naas 09/10/2019, [place], [place] [phone] 09/10/2019

## 2019-09-10 NOTE — Anesthesia Postprocedure Evaluation (Signed)
Anesthesia Post Note  Patient: Carol Bradley Feltman  Procedure(s) Performed: ESOPHAGOGASTRODUODENOSCOPY (EGD) (N/A )     Patient location during evaluation: PACU Anesthesia Type: MAC Level of consciousness: awake and alert and oriented Pain management: pain level controlled Vital Signs Assessment: post-procedure vital signs reviewed and stable Respiratory status: spontaneous breathing, nonlabored ventilation, respiratory function stable and patient connected to nasal cannula oxygen Cardiovascular status: stable and blood pressure returned to baseline Postop Assessment: no apparent nausea or vomiting Anesthetic complications: no    Last Vitals:  Vitals:   09/10/19 0930 09/10/19 0940  BP: (!) 114/49 (!) 113/52  Pulse: 94 99  Resp: (!) 23 18  Temp:    SpO2: 94% 100%    Last Pain:  Vitals:   09/10/19 0940  TempSrc:   PainSc: 0-No pain                 Nayellie Sanseverino A.

## 2019-09-10 NOTE — Progress Notes (Signed)
Dany Wilhelm Demetriou 8:55 AM  Subjective: Patient doing well without any complaints and no more hemoptysis or hematemesis and she is unsure of the color of her last bowel movement and no new complaints and we rediscussed the procedure  Objective: Vital signs stable afebrile no acute distress exam please see preassessment evaluation BUN slight increase decreased creatinine hemoglobin decrease and increase  Assessment: Questionable GI blood loss  Plan: Okay to proceed with endoscopy with anesthesia assistance today  [facility]  office [phone] After 5PM or if no answer call [phone]

## 2019-09-10 NOTE — Transfer of Care (Signed)
Immediate Anesthesia Transfer of Care Note  Patient: Carol Bradley  Procedure(s) Performed: ESOPHAGOGASTRODUODENOSCOPY (EGD) (N/A )  Patient Location: Endoscopy Unit  Anesthesia Type:MAC  Level of Consciousness: drowsy and patient cooperative  Airway & Oxygen Therapy: Patient Spontanous Breathing and Patient connected to nasal cannula oxygen  Post-op Assessment: Report given to RN and Post -op Vital signs reviewed and stable  Post vital signs: Reviewed and stable  Last Vitals:  Vitals Value Taken Time  BP 102/46 09/10/19 0920  Temp    Pulse 99 09/10/19 0920  Resp 32 09/10/19 0920  SpO2 91 % 09/10/19 0920  Vitals shown include unvalidated device data.  Last Pain:  Vitals:   09/10/19 0920  TempSrc:   PainSc: Asleep         Complications: No apparent anesthesia complications

## 2019-09-10 NOTE — Op Note (Signed)
[facility] Patient Name: Carol Bradley Procedure Date : 09/10/2019 MRN: [id number] Attending MD: Clarene Essex , MD Date of Birth: 1941/06/14 CSN: [id number] Age: 79 Admit Type: Inpatient Procedure:                Upper GI endoscopy Indications:              Iron deficiency anemia secondary to chronic blood                            loss, Hematemesis versus hemoptysis Providers:                Clarene Essex, MD, Jeanella Cara, RN, Elspeth Cho Tech., Technician, Lance Coon, CRNA Referring MD:              Medicines:                Propofol per Anesthesia Complications:            No immediate complications. Estimated Blood Loss:     Estimated blood loss: none. Procedure:                Pre-Anesthesia Assessment:                           - Prior to the procedure, a History and Physical                            was performed, and patient medications and                            allergies were reviewed. The patient's tolerance of                            previous anesthesia was also reviewed. The risks                            and benefits of the procedure and the sedation                            options and risks were discussed with the patient.                            All questions were answered, and informed consent                            was obtained. Prior Anticoagulants: The patient has                            taken no previous anticoagulant or antiplatelet                            agents except for aspirin. ASA Grade Assessment:  III - A patient with severe systemic disease. After                            reviewing the risks and benefits, the patient was                            deemed in satisfactory condition to undergo the                            procedure.                           After obtaining informed consent, the endoscope was                            passed under  direct vision. Throughout the                            procedure, the patient's blood pressure, pulse, and                            oxygen saturations were monitored continuously. The                            [id number] Olympus gastroscope was                            introduced through the mouth, and advanced to the                            jejunum. The upper GI endoscopy was accomplished                            without difficulty. The patient tolerated the                            procedure well. Scope In: Scope Out: Findings:      The larynx was normal.      A small hiatal hernia was present.      One non-bleeding small cratered gastric ulcer with pigmented material       was found on the greater curvature of the stomach.      A single diminutive angiodysplastic lesion with no bleeding was found on       the greater curvature of the stomach.      A single diminutive angiodysplastic lesion without bleeding was found in       the second portion of the duodenum.      A medium non-bleeding diverticulum was found in the third portion of the       duodenum.      The examined jejunum was normal.      The exam was otherwise without abnormality. Impression:               - Normal larynx.                           -  Small hiatal hernia.                           - Non-bleeding small gastric ulcer with pigmented                            material.                           - A single non-bleeding angiodysplastic lesion in                            the stomach.                           - A single non-bleeding angiodysplastic lesion in                            the duodenum.                           - Non-bleeding duodenal diverticulum.                           - Normal examined jejunum.                           - The examination was otherwise normal.                           - No specimens collected. Recommendation:           - Soft diet today.                            - No aspirin, ibuprofen, naproxen, or other                            non-steroidal anti-inflammatory drugs. Reevaluate                            aspirin needs                           -Use IV Protonix for a few days and then use                            Protonix (pantoprazole) 40 mg PO daily indefinitely                            particularly if she will require aspirin going                            forward.                           - Return to GI clinic PRN.                           -  Telephone GI clinic if symptomatic PRN. Procedure Code(s):        --- Professional ---                           [id number], Esophagogastroduodenoscopy, flexible,                            transoral; diagnostic, including collection of                            specimen(s) by brushing or washing, when performed                            (separate procedure) Diagnosis Code(s):        --- Professional ---                           K44.9, Diaphragmatic hernia without obstruction or                            gangrene                           K25.9, Gastric ulcer, unspecified as acute or                            chronic, without hemorrhage or perforation                           K31.819, Angiodysplasia of stomach and duodenum                            without bleeding                           D50.0, Iron deficiency anemia secondary to blood                            loss (chronic)                           K92.0, Hematemesis                           K57.10, Diverticulosis of small intestine without                            perforation or abscess without bleeding CPT copyright 2019 [employer]. All rights reserved. The codes documented in this report are preliminary and upon coder review may  be revised to meet current compliance requirements. Clarene Essex, MD 09/10/2019 9:32:19 AM This report has been signed electronically. Number of Addenda: 0

## 2019-09-10 NOTE — Progress Notes (Signed)
PT Cancellation Note  Patient Details Name: Carol Bradley MRN: [id number] DOB: 1940-12-02   Cancelled Treatment:    Reason Eval/Treat Not Completed: Fatigue/lethargy limiting ability to participate; patient fatigued and resting from procedure.  Will attempt again another day.   Reginia Naas 09/10/2019, 3:46 PM  Carol Bradley, [place] [phone] 09/10/2019

## 2019-09-10 NOTE — Progress Notes (Signed)
eLink Physician-Brief Progress Note Patient Name: Carol Bradley DOB: 1940/11/13 MRN: [id number]   Date of Service  09/10/2019  HPI/Events of Note  Patient requires VTE prophylaxis. Patient admitted with hematemesis. EGD planned for tomorrow.   eICU Interventions  Will order: 1. Place SCD's.     Intervention Category Intermediate Interventions: Best-practice therapies (e.g. DVT, beta blocker, etc.)  [name] 09/10/2019, 1:04 AM

## 2019-09-10 NOTE — Consult Note (Signed)
NAME:  Carol Bradley, MRN:  [id number], DOB:  Nov 04, 1940, LOS: 5 ADMISSION DATE:  09/05/2019, CONSULTATION DATE:  09/09/2019 REFERRING MD:  Dr. Maylene Roes, CHIEF COMPLAINT:  Hypotension  Brief History   79 year old female presenting with N/V and SOB found hypoxic and acute on chronic anemic being treated for CAP and HFpEF.  Transfused 1 unit on 2/27.  Being treated with empiric antibiotics and diuresed.  On 3/2 she developed worsening nausea with several bloody emesis with hypotension.  ASA and lovenox since stopped and transitioned to IV protonix.  Hypotension improved after 2 L NS.  Patient has multiple antibodies on T&C.  GI consulted and PCCM consulted.    History of present illness   79 year old female with prior history of multiple myeloma, anemia, COPD, CAD, HTN, and CKD stage III who presented on 2/26 with shortness of breath, nausea, and vomiting found to be hypoxic on room air at 56% and anemic with Hgb of 7.8.  She was admitted to [facility] being treated for anemia, heart failure and pneumonia.  She was transfused 1 unit of PRBC on 2/27. COVID negative, PCT 0.35 and CXR noted to have bilateral diffuse interstitial thickening though to reflect either interstitial edema versus multilobar pneumonia.  She was started on ceftriaxone and azithromycin.  She was noted to have elevated troponin's however TTE on 2/26 did not show any wall motion abnormalities; EF noted 55-60% and noted for G1DD.  She has been diuresed.  On 3/2 she developed worsening nausea with several bloody emesis estimated ~ 200 ml with hypotension.  ASA and lovenox since stopped and transitioned to IV protonix.  Hypotension improved after 2 L NS. Since IVFs, blood pressure has since stabilized.  She continues to complain of nausea.  Noted to have some retching on floor Patient has multiple antibodies on T&C.  GI consulted and PCCM consulted, and transferred to ICU.    Past Medical History  CAD, CKD stage III, HTN, multiple myeloma, anemia, COPD,  HFpEF  Significant Hospital Events   2/26 admitted  3/2 > transfer to ICU 3/3 > EGD  Consults:  GI  Procedures:  EGD 3/3 >   Significant Diagnostic Tests:  2/26 TTE >> 1. Left ventricular ejection fraction, by estimation, is 55 to 60%. The  left ventricle has normal function. The left ventricle has no regional  wall motion abnormalities. There is mild concentric left ventricular  hypertrophy. Left ventricular diastolic  parameters are consistent with Grade I diastolic dysfunction (impaired  relaxation).  2. Right ventricular systolic function is normal. The right ventricular  size is mildly enlarged. There is mildly elevated pulmonary artery  systolic pressure.  3. Right atrial size was mildly dilated.  4. The mitral valve is degenerative. Moderate mitral valve regurgitation.  5. Tricuspid valve regurgitation is mild to moderate.  6. The aortic valve is tricuspid. Aortic valve regurgitation is mild.  Mild aortic valve sclerosis is present, with no evidence of aortic valve  stenosis.  7. The inferior vena cava is normal in size with greater than 50%  respiratory variability, suggesting right atrial pressure of 3 mmHg.   Micro Data:  2/26 SARS 2 >> neg 2/26 BC x 2 >> neg  Antimicrobials:  2/27 acyclovir >> 2/27 azithromycin >> 2/27 ceftriaxone >>  Interim history/subjective:  Nausea improved.  Currently no complaints. Going for EGD this morning.  Objective   Blood pressure (!) 113/51, pulse (!) 105, temperature 98.5 F (36.9 C), temperature source Oral, resp. rate Marland Kitchen)  24, height '5\' 6"'$  (1.676 m), weight 78.2 kg, SpO2 97 %.        Intake/Output Summary (Last 24 hours) at 09/10/2019 0752 Last data filed at 09/10/2019 0600 Gross per 24 hour  Intake 888.72 ml  Output 800 ml  Net 88.72 ml   Filed Weights   09/08/19 0300 09/09/19 0459 09/10/19 0500  Weight: 74.5 kg 74.5 kg 78.2 kg    Examination: General:  Elderly female lying in bed, in NAD. HEENT: MM  pale/mois, EOMI. Neuro: Alert, oriented x 3, MAE. CV: RRR, no M/R/G. PULM:  Non labored, CTA. GI: BS x 4, obese, soft, NT/ ND. Extremities: warm/dry, no edema. Skin: no rashes.  Assessment & Plan:   Concern for hemorraghic shock from hematemesis/ suspected UGIB. P:  Continue BID PPI. EGD this AM per GI.  Acute on chronic anemia (baseline 9-10). P:  Follow H/H, transfusion challenging as pt has many blood antibodies requiring special blood sent from [place]. Transfuse for Hgb > 7.  Acute hypoxemic respiratory failure - presumed 2/2 CAP.  Hx COPD, doubt AE. P:  Supplemental O2 for sat goal >92. Azithro/ ceftriaxone for 5 days total. Encourage pulmonary toliet. Continue nebs/ anoro ellipta.  AKI on CKD stage III. P:  Trend UOP/ BMET.  HTN.  Acute on chronic diastolic heart failure (Z9VU, EF 55-60%, no regional wall abnormalites). CAD s/p PCI/ DES in 2012. P:  Tele monitoring. Holding meds while NPO, r/o UGIB, and hypotensive- lipitor, ASA, lopressor. Hold any further diuresis.  Daily wt/ strict I/Os.  Hx Multiple Myeloma - Followed by Dr. Delton Coombes. P:  Continue chronic acyclovir.  Best practice:  Diet: NPO. Pain/Anxiety/Delirium protocol (if indicated): n/a. VAP protocol (if indicated): n/a. DVT prophylaxis: SCDs only.  GI prophylaxis: PPI BID. Glucose control: trend on BMET. Mobility: BR. Code Status: Full.  Family Communication: None available AM 3/3. Disposition: ICU.   CC time: 30 min.   Montey Hora, [facility] 09/10/2019, 8:04 AM

## 2019-09-10 NOTE — Progress Notes (Signed)
OT Cancellation Note  Patient Details Name: BRE GETCHELL MRN: [id number] DOB: 20-Nov-1940   Cancelled Treatment:    Reason Eval/Treat Not Completed: Patient at procedure or test/ unavailable (EGD), will follow up for OT treatment as able.  Lou Cal, OT Acute Rehabilitation Services Pager [phone] Office [phone]   Raymondo Band 09/10/2019, 9:09 AM

## 2019-09-10 NOTE — Progress Notes (Signed)
eLink Physician-Brief Progress Note Patient Name: Carol Bradley DOB: 10-Mar-1941 MRN: [id number]   Date of Service  09/10/2019  HPI/Events of Note  Anemia - Hgb = 7.7. Patient's son states that her physicians usually keep her Hgb > 8. Special blood sent from [place] d/t antibody issues.   eICU Interventions  Will order: 1. Transfuse 1 unit PRBC now.     Intervention Category Major Interventions: Other:  Lysle Dingwall 09/10/2019, 10:41 PM

## 2019-09-10 NOTE — Plan of Care (Signed)
Pt had 3 total stools of old blood during shift, at times with small clots noted.  Monitor showed ST 100s to 120s, BP stable.  Pt c/o nausea at times, treated with zofran IV as needed.  Scheduled for EGD later today.  Has had one unit PRBCs overnight with some improvement with BP and overall energy level.    Problem: Activity: Goal: Risk for activity intolerance will decrease Outcome: Progressing   Problem: Clinical Measurements: Goal: Respiratory complications will improve Outcome: Progressing   Problem: Clinical Measurements: Goal: Cardiovascular complication will be avoided Outcome: Progressing

## 2019-09-10 NOTE — Progress Notes (Signed)
  PROGRESS NOTE  Patient remains stable in ICU today. Will pick up on [facility] service tomorrow 3/4. Discussed with Dr. Elsworth Soho.    Dessa Phi, DO [facility] 09/10/2019, 12:06 PM  Available via Epic secure chat 7am-7pm After these hours, please refer to coverage provider listed on [email or web address]

## 2019-09-10 NOTE — Anesthesia Preprocedure Evaluation (Addendum)
Anesthesia Evaluation  Patient identified by MRN, date of birth, ID band Patient awake    Reviewed: Allergy & Precautions, NPO status , Patient's Chart, lab work & pertinent test results  Airway Mallampati: III  TM Distance: >3 FB Neck ROM: Full   Comment: Maxillary Tori Dental no notable dental hx. (+) Teeth Intact, Caps,    Pulmonary pneumonia, resolved, COPD,  COPD inhaler, former smoker,    Pulmonary exam normal breath sounds clear to auscultation       Cardiovascular hypertension, Pt. on medications and Pt. on home beta blockers + CAD, + Cardiac Stents and +CHF  Normal cardiovascular exam Rhythm:Regular Rate:Normal     Neuro/Psych Peripheral neuropathy Autonomic dysfunction negative psych ROS   GI/Hepatic Neg liver ROS, Upper GI bleed   Endo/Other  Hyperlipidemia  Renal/GU Renal InsufficiencyRenal disease Bladder dysfunction      Musculoskeletal negative musculoskeletal ROS (+)   Abdominal   Peds  Hematology  (+) anemia , Multiple myeloma   Anesthesia Other Findings Bilateral chemosis  Reproductive/Obstetrics                           Anesthesia Physical Anesthesia Plan  ASA: III  Anesthesia Plan: MAC   Post-op Pain Management:    Induction: Intravenous  PONV Risk Score and Plan: TIVA, Ondansetron and Treatment may vary due to age or medical condition  Airway Management Planned: Natural Airway and Nasal Cannula  Additional Equipment:   Intra-op Plan:   Post-operative Plan:   Informed Consent: I have reviewed the patients History and Physical, chart, labs and discussed the procedure including the risks, benefits and alternatives for the proposed anesthesia with the patient or authorized representative who has indicated his/her understanding and acceptance.     Dental advisory given  Plan Discussed with: CRNA and Anesthesiologist  Anesthesia Plan Comments:          Anesthesia Quick Evaluation

## 2019-09-11 DIAGNOSIS — R578 Other shock: Secondary | ICD-10-CM

## 2019-09-11 DIAGNOSIS — I509 Heart failure, unspecified: Secondary | ICD-10-CM

## 2019-09-11 LAB — BASIC METABOLIC PANEL
Anion gap: 6 (ref 5–15)
BUN: 26 mg/dL — ABNORMAL HIGH (ref 8–23)
CO2: 24 mmol/L (ref 22–32)
Calcium: 7.9 mg/dL — ABNORMAL LOW (ref 8.9–10.3)
Chloride: 117 mmol/L — ABNORMAL HIGH (ref 98–111)
Creatinine, Ser: 1.09 mg/dL — ABNORMAL HIGH (ref 0.44–1.00)
GFR calc Af Amer: 56 mL/min — ABNORMAL LOW (ref >60)
GFR calc non Af Amer: 49 mL/min — ABNORMAL LOW (ref >60)
Glucose, Bld: 89 mg/dL (ref 70–99)
Potassium: 4 mmol/L (ref 3.5–5.1)
Sodium: 147 mmol/L — ABNORMAL HIGH (ref 135–145)

## 2019-09-11 LAB — CBC
HCT: 28.4 % — ABNORMAL LOW (ref 36.0–46.0)
Hemoglobin: 8.7 g/dL — ABNORMAL LOW (ref 12.0–15.0)
MCH: 30.2 pg (ref 26.0–34.0)
MCHC: 30.6 g/dL (ref 30.0–36.0)
MCV: 98.6 fL (ref 80.0–100.0)
Platelets: 282 10*3/uL (ref 150–400)
RBC: 2.88 MIL/uL — ABNORMAL LOW (ref 3.87–5.11)
RDW: 21.1 % — ABNORMAL HIGH (ref 11.5–15.5)
WBC: 5.8 10*3/uL (ref 4.0–10.5)
nRBC: 0.5 % — ABNORMAL HIGH (ref 0.0–0.2)

## 2019-09-11 LAB — PHOSPHORUS: Phosphorus: 3.2 mg/dL (ref 2.5–4.6)

## 2019-09-11 LAB — MAGNESIUM: Magnesium: 1.9 mg/dL (ref 1.7–2.4)

## 2019-09-11 MED ORDER — SUCRALFATE 1 G PO TABS
1.0000 g | ORAL_TABLET | Freq: Once | ORAL | Status: AC
  Administered 2019-09-11: 1 g via ORAL
  Filled 2019-09-11: qty 1

## 2019-09-11 MED ORDER — PANTOPRAZOLE SODIUM 40 MG PO TBEC
40.0000 mg | DELAYED_RELEASE_TABLET | Freq: Two times a day (BID) | ORAL | Status: DC
  Administered 2019-09-12 – 2019-09-15 (×7): 40 mg via ORAL
  Filled 2019-09-11 (×3): qty 1
  Filled 2019-09-11: qty 2
  Filled 2019-09-11 (×3): qty 1

## 2019-09-11 MED ORDER — METOPROLOL TARTRATE 25 MG PO TABS
37.5000 mg | ORAL_TABLET | Freq: Two times a day (BID) | ORAL | Status: DC
  Administered 2019-09-11 – 2019-09-15 (×8): 37.5 mg via ORAL
  Filled 2019-09-11 (×3): qty 1
  Filled 2019-09-11: qty 2
  Filled 2019-09-11: qty 1
  Filled 2019-09-11: qty 2
  Filled 2019-09-11 (×3): qty 1

## 2019-09-11 MED ORDER — DILTIAZEM HCL ER COATED BEADS 240 MG PO CP24
240.0000 mg | ORAL_CAPSULE | Freq: Every day | ORAL | Status: DC
  Administered 2019-09-11 – 2019-09-15 (×5): 240 mg via ORAL
  Filled 2019-09-11 (×5): qty 1

## 2019-09-11 MED ORDER — DEXTROSE 5 % IV SOLN: INTRAVENOUS | Status: DC

## 2019-09-11 MED ORDER — METOPROLOL TARTRATE 5 MG/5ML IV SOLN: 5.0000 mg | Freq: Four times a day (QID) | INTRAVENOUS | Status: DC | PRN

## 2019-09-11 NOTE — Progress Notes (Signed)
Physical Therapy Treatment Patient Details Name: MCCAULEY MERGEN MRN: [id number] DOB: May 22, 1941 Today's Date: 09/11/2019    History of Present Illness Pt is a 79 yo female s/p 7 days of SOB, N/V. Found to be anemic, EF 55-60% and requiring O2. PMHx: CAD, myeloma, HTN, COPD, back sx, neck sx.  Developed hematemesis and hypotension on 09/09/19 and was transferred to ICU.  Underwent EGD 09/10/19.    PT Comments    Patient progressing slowly and with decreased activity tolerance this session with HR up to 151 with activities in bathroom and needing 3L O2 to keep SpO2 in 90's.  She needed more assist for safety due to weakness/fatigue from events of past couple of days.  Feel she will now need 24 hour assist at home for safety and if family unable to provide recommend SNF level rehab at d/c.    Follow Up Recommendations  Supervision/Assistance - 24 hour;SNF;Home health PT(depending on progress, if has 24 hour assist)     Equipment Recommendations  None recommended by PT    Recommendations for Other Services       Precautions / Restrictions Precautions Precautions: Fall Precaution Comments: watch O2, watch HR Restrictions Weight Bearing Restrictions: No    Mobility  Bed Mobility Overal bed mobility: Needs Assistance Bed Mobility: Supine to Sit;Sit to Supine     Supine to sit: Min guard;HOB elevated Sit to supine: Supervision   General bed mobility comments: Min Guard A for safety; assist for positioning in supine  Transfers Overall transfer level: Needs assistance Equipment used: Rolling walker (2 wheeled) Transfers: Sit to/from Stand Sit to Stand: Min guard;Min assist         General transfer comment: Min Guard A from EOB. Min A to power up from toilet  Ambulation/Gait Ambulation/Gait assistance: Min assist Gait Distance (Feet): 12 Feet(x 2) Assistive device: Rolling walker (2 wheeled) Gait Pattern/deviations: Step-to pattern;Step-through pattern;Trunk flexed;Decreased  stride length     General Gait Details: rushing to bathroom and back c/o weakness, decreased tolerance; HR up to 151 in bathroom   Stairs             Wheelchair Mobility    Modified Rankin (Stroke Patients Only)       Balance Overall balance assessment: Needs assistance Sitting-balance support: No upper extremity supported;Feet supported Sitting balance-Leahy Scale: Fair     Standing balance support: Bilateral upper extremity supported;During functional activity Standing balance-Leahy Scale: Poor Standing balance comment: Reliant on UE support                            Cognition Arousal/Alertness: Awake/alert Behavior During Therapy: Flat affect Overall Cognitive Status: Within Functional Limits for tasks assessed                                 General Comments: Slight flat and requiring increased encouragement for OOB activity      Exercises      General Comments General comments (skin integrity, edema, etc.): Patient on 3L O2 throughout session; trial on RA prior to session with SpO2 drop to 85%.  HR max with ADL's in bathroom in standing 151.      Pertinent Vitals/Pain Pain Assessment: No/denies pain    Home Living                      Prior Function  PT Goals (current goals can now be found in the care plan section) Acute Rehab PT Goals Patient Stated Goal: to go home Progress towards PT goals: Progressing toward goals(limited)    Frequency    Min 3X/week      PT Plan Discharge plan needs to be updated    Co-evaluation PT/OT/SLP Co-Evaluation/Treatment: Yes Reason for Co-Treatment: For patient/therapist safety;To address functional/ADL transfers;Other (comment)(due to decreased activity tolerance) PT goals addressed during session: Mobility/safety with mobility;Balance;Proper use of DME        AM-PAC PT "6 Clicks" Mobility   Outcome Measure  Help needed turning from your back to your  side while in a flat bed without using bedrails?: A Little Help needed moving from lying on your back to sitting on the side of a flat bed without using bedrails?: A Little Help needed moving to and from a bed to a chair (including a wheelchair)?: A Little Help needed standing up from a chair using your arms (e.g., wheelchair or bedside chair)?: A Little Help needed to walk in hospital room?: A Little Help needed climbing 3-5 steps with a railing? : A Little 6 Click Score: 18    End of Session Equipment Utilized During Treatment: Oxygen Activity Tolerance: Patient limited by fatigue Patient left: in bed;with call bell/phone within reach;with bed alarm set   PT Visit Diagnosis: Muscle weakness (generalized) (M62.81)     Time: [id number] PT Time Calculation (min) (ACUTE ONLY): 25 min  Charges:  $Gait Training: 8-22 mins                     Magda Kiel, [place] [phone] 09/11/2019    Reginia Naas 09/11/2019, 12:39 PM

## 2019-09-11 NOTE — Progress Notes (Signed)
Occupational Therapy Treatment Patient Details Name: Carol Bradley MRN: [id number] DOB: August 18, 1940 Today's Date: 09/11/2019    History of present illness Pt is a 79 yo female s/p 7 days of SOB, N/V. Found to be anemic, EF 55-60% and requiring O2. PMHx: CAD, myeloma, HTN, COPD, back sx, neck sx   OT comments  Pt progressing towards established OT goals. However, continues to present with decreased activity tolerance as seen by elevated HR to 151 and significant fatigue with BADLs. Pt with bowel incontinence during mobility to bathroom and requiring Min A for toileting. Pt requiring Min Guard A for safety standing at sink during hand hygiene; cueing pt to sit for remaining grooming tasks due to elevating HR. Continue to recommend dc to home with HHOT. However, pending her progress, she may need SNF placement for increased therapy optimizing safety before dc to home.   Follow Up Recommendations  Home health OT ; SNF; Pending progress   Equipment Recommendations  None recommended by OT    Recommendations for Other Services      Precautions / Restrictions Precautions Precautions: Fall Precaution Comments: watch O2 Restrictions Weight Bearing Restrictions: No       Mobility Bed Mobility Overal bed mobility: Needs Assistance Bed Mobility: Supine to Sit     Supine to sit: Min guard;HOB elevated     General bed mobility comments: Min Guard A for safety  Transfers Overall transfer level: Needs assistance Equipment used: Rolling walker (2 wheeled) Transfers: Sit to/from Stand Sit to Stand: Min guard;Min assist         General transfer comment: Min Guard A from EOB. Min A to power up from toilet    Balance Overall balance assessment: Needs assistance Sitting-balance support: No upper extremity supported;Feet supported Sitting balance-Leahy Scale: Fair     Standing balance support: Bilateral upper extremity supported;During functional activity Standing balance-Leahy Scale:  Poor Standing balance comment: Reliant on UE support                           ADL either performed or assessed with clinical judgement   ADL Overall ADL's : Needs assistance/impaired     Grooming: Min guard;Oral care;Brushing hair;Set up;Supervision/safety;Sitting;Standing Grooming Details (indicate cue type and reason): Pt initially standing at sink to wash hands and starting oral care. Pt leaning heavily on sink counter and presenting with significant fatigue. Pt attempting to move quickly to decrease time spend. HR elevating to 151 at sink. Having pt sit on BSC at sink to finish oral care and then brush her hair. Sitting at [facility] for ~2 minutes to lower HR and regain energy.             Lower Body Dressing: Bed level;Supervision/safety Lower Body Dressing Details (indicate cue type and reason): Pt donning socks at bed lvel by using figure four method in supine. Min A for standing balance Toilet Transfer: Minimal assistance;Ambulation;RW;Grab Information systems manager Details (indicate cue type and reason): Min A for safe descent to toilet.  Toileting- Clothing Manipulation and Hygiene: Minimal assistance;Sit to/from stand;Sitting/lateral lean Toileting - Clothing Manipulation Details (indicate cue type and reason): MIn A for balance and then assistance to make sure she is clean.     Functional mobility during ADLs: Minimal assistance;Rolling walker General ADL Comments: Pt presenting with decreased activity tolerance as seen by elevated HR and significant fatigue     Vision   Vision Assessment?: No apparent visual deficits   Perception  Praxis      Cognition Arousal/Alertness: Awake/alert Behavior During Therapy: Flat affect Overall Cognitive Status: Within Functional Limits for tasks assessed                                 General Comments: Slight flat and requiring increased encouragement for OOB activity        Exercises      Shoulder Instructions       General Comments SpO2 dropping to 86-84% on RA. Pt requiring 3L O2 to maintain SpO2 in 90s. HR elevating to 151 during toileting and grooming. Requiring     Pertinent Vitals/ Pain       Pain Assessment: No/denies pain  Home Living                                          Prior Functioning/Environment              Frequency  Min 2X/week        Progress Toward Goals  OT Goals(current goals can now be found in the care plan section)  Progress towards OT goals: Progressing toward goals  Acute Rehab OT Goals Patient Stated Goal: to go home OT Goal Formulation: With patient Time For Goal Achievement: 09/22/19 Potential to Achieve Goals: Good ADL Goals Pt Will Perform Grooming: with modified independence;standing Pt/caregiver will Perform Home Exercise Program: Increased strength;Both right and left upper extremity;With Supervision Additional ADL Goal #1: Pt will recall 3 strategies for energy conservation techniques to be used for ADL after education.  Plan Discharge plan remains appropriate    Co-evaluation                 AM-PAC OT "6 Clicks" Daily Activity     Outcome Measure   Help from another person eating meals?: None Help from another person taking care of personal grooming?: None Help from another person toileting, which includes using toliet, bedpan, or urinal?: A Little Help from another person bathing (including washing, rinsing, drying)?: A Little Help from another person to put on and taking off regular upper body clothing?: None Help from another person to put on and taking off regular lower body clothing?: None 6 Click Score: 22    End of Session Equipment Utilized During Treatment: Gait belt;Rolling walker  OT Visit Diagnosis: Unsteadiness on feet (R26.81);Muscle weakness (generalized) (M62.81)   Activity Tolerance Patient tolerated treatment well   Patient Left in bed;with call bell/phone  within reach   Nurse Communication Mobility status        Time: [id number] OT Time Calculation (min): 25 min  Charges: OT General Charges $OT Visit: 1 Visit OT Treatments $Self Care/Home Management : 8-22 mins  [place], OTR/L Acute Rehab Pager: [phone] Office: [place] 09/11/2019, 11:36 AM

## 2019-09-11 NOTE — Progress Notes (Addendum)
Patient underwent EGD yesterday which revealed 1 nonbleeding gastric ulcer with pigmented material and 2 small nonbleeding AVMs (1 in the stomach and one in the duodenum).  Patient remains stable.  Hgb today is 8.7, increased from 7.7 after 1u PRBCs overnight (appropriate rise posttransfusion).  BUN is trending down--now 26 from 40 yesterday.  Cr stable at 1.09.  Impression: Quiescent bleed from gastric ulcer  Recommendations:  1. Have switched to oral Protonix 40mg  bid.  Patient should stay on this dose at time of discharge until seen by GI.  2.  Recommend follow-up with PCP or hematologist 1 week post-discharge to recheck Hgb.  3.  Have ordered a one-time dose of sucralfate to help "patch" ulcer  4.  Have left message for my partner, Dr. Watt Climes, to contact the patient to arrange GI follow-up at his discretion.  I advised the patient and her son, at the bedside, that there is a good likelihood he will want to do a repeat endoscopy in several months to confirm healing of her gastric ulcer.  Have advised the patient and her son to contact our office if they have not heard from [place] in the next several weeks, and have given them my card for that purpose.  In addition to her ulcer itself, the patient was having quite a bit of antecedent dyspeptic symptoms with burping, belching, and an unsettled stomach which may or may not have been related to the ulcer itself.  Hopefully, PPI therapy will take care of that, but the patient's dyspepsia can be addressed at the time of her GI follow-up visit.  GI will sign off.  Please let us know if you have questions or we can be of any further assistance during this hospital stay.  We anticipate the patient will be in the hospital for some time because of her multiple medical issues, but from the GI tract standpoint, she appears to be "out of the woods" with respect to her ulcer and bleeding and therefore could be discharged from our standpoint whenever it seems  otherwise appropriate with respect to her overall medical condition.   Cleotis Nipper, M.D. 09/11/2019 6:02 PM  Pager [phone] If no answer or after 5 PM call [phone]

## 2019-09-11 NOTE — Progress Notes (Addendum)
[facility] PROGRESS NOTE    Progress Note  Carol Bradley  [id number] DOB: 1940/07/11 DOA: 09/05/2019 PCP: [name], MD     Brief Narrative:   Carol Bradley is an 79 y.o. female past medical history of multiple myeloma, chronic anemia COPD, CAD, essential hypertension and chronic kidney disease stage III was found to be hypoxic with nausea and vomiting with a hemoglobin of 7.8 being treated for anemia heart failure and pneumonia status post 1 unit of packed red blood cells on 09/06/2019, Covid negative procalcitonin 0.35, chest x-ray showed bilateral effusion with interstitial thickening.  She was started empirically on Rocephin and azithromycin she was noted to have elevated troponins and 2D echo was done that did not show any wall motion abnormality with an EF of 55%.  She was diuresed on 09/09/2019 she developed worsening nausea with severe bloody emesis and hypotension aspirin and Lovenox were held she was started on IV Protonix she was fluid resuscitated.  GI was consulted and PCCM and transferred to the unit.  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  GI recommended conservative management continue IV Protonix avoid NSAIDs.  Assessment/Plan:   Hemorrhagic shock with hematemesis suspect upper GI bleed/ Symptomatic anemia Status post EGD on 09/10/2019 that showed an dysplastic nonbleeding duodenal lesion. Avoid NSAIDs continue IV Protonix. Appreciate GIs assistance. She is status post 1 unit of packed red blood cells her hemoglobin this morning is 8.7. Continue soft diet. Physical therapy to evaluate.  Acute respiratory failure with hypoxia probably secondary to community-acquired pneumonia: She has completed her course of IV Rocephin and azithromycin. Continue pulmonary toileting's and inhalers. Culture data has remained negative till date.  Acute kidney injury on chronic kidney disease stage IIIa: Likely prerenal  azotemia in the setting of infectious etiology now resolved.  Essential hypertension/acute on chronic diastolic heart failure (with an EF of 55%): Currently n.p.o. antihypertensive medications have been held in the setting of hemorrhagic shock. Now that she is tolerating her diet will start her Lipitor her blood pressure is borderline continue to hold antihypertensive medication.  CAD (coronary artery disease) Continue Lipitor.  Multiple myeloma (HCC) Continue chronic acyclovir, she is on prednisone for her multiple myeloma treatment. She will probably need Protonix as an outpatient.  Emphysema/COPD (Garza-Salinas II) No wheezing continue current regimen.  Elevated troponin Likely demand ischemia in the setting of chronic renal disease. The patient denies chest pain.  New hypernatremia: Start D5w. Recheck a b-me tin am. Likely hypovolemic.   DVT prophylaxis: SCD's Family Communication:none Disposition Plan/Barrier to D/C: Once she is able to tolerate her diet and physical therapy has evaluated her.  Code Status:     Code Status Orders  (From admission, onward)         Start     Ordered   09/05/19 2141  Full code  Continuous     09/05/19 2140        Code Status History    Date Active Date Inactive Code Status Order ID Comments User Context   04/14/2016 1103 04/14/2016 2011 Full Code [id number]  Kathie Dike, MD Inpatient   Advance Care Planning Activity    Advance Directive Documentation     Most Recent Value  Type of Advance Directive  Healthcare Power of Attorney, Living will  Pre-existing out of facility DNR order (yellow form or pink MOST form)  --  "MOST" Form in Place?  --  IV Access:    Peripheral IV   Procedures and diagnostic studies:   DG Chest 2 View  Result Date: 09/09/2019 CLINICAL DATA:  Acute respiratory failure. EXAM: CHEST - 2 VIEW COMPARISON:  Chest radiograph 09/05/2019. FINDINGS: The cardiomediastinal silhouette is unchanged. Bilateral  pleural effusions (small right, trace left). Ill-defined opacity within the lateral right lung base is new from prior examination and may reflect atelectasis or consolidation. Similar appearance of diffuse bilateral interstitial prominence. No evidence of pneumothorax. No acute bony abnormality. Partially visualized ACDF hardware. Overlying cardiac monitoring leads. IMPRESSION: Bilateral pleural effusions (small right, trace left). Persistent diffuse bilateral interstitial prominence likely reflecting a component of interstitial edema. Ill-defined opacity within the lateral right lung base, new from prior exam and possibly reflecting atelectasis. Pneumonia cannot be excluded. Electronically Signed   By: Kellie Simmering DO   On: 09/09/2019 09:36     Medical Consultants:    None.  Anti-Infectives:   Rocephin/azithro  Subjective:    Carol Bradley she has no new complaints no abdominal pain he is tolerating her diet well  Objective:    Vitals:   09/11/19 0300 09/11/19 0400 09/11/19 0500 09/11/19 0600  BP: (!) 122/59 (!) 111/52 (!) 106/49 (!) 104/52  Pulse: (!) 109 (!) 108 (!) 115 (!) 112  Resp: (!) 24 (!) 21 (!) 26 (!) 24  Temp: 98 F (36.7 C)     TempSrc: Oral     SpO2: 92% 93% 93% 96%  Weight:      Height:       SpO2: 96 % O2 Flow Rate (L/min): 2 L/min   Intake/Output Summary (Last 24 hours) at 09/11/2019 0707 Last data filed at 09/11/2019 0600 Gross per 24 hour  Intake 200 ml  Output 450 ml  Net -250 ml   Filed Weights   09/08/19 0300 09/09/19 0459 09/10/19 0500  Weight: 74.5 kg 74.5 kg 78.2 kg    Exam: General exam: [name] Respiratory system: Good air movement and clear to auscultation. Cardiovascular system: S1 & S2 heard, RRR.  Gastrointestinal system: Abdomen is nondistended, soft and nontender.  Central nervous system: Alert and oriented. No focal neurological deficits. Extremities: No pedal edema. Skin: No rashes, lesions or ulcers Psychiatry:  Judgement and insight appear normal. Mood & affect appropriate.   Data Reviewed:    Labs: Basic Metabolic Panel: Recent Labs  Lab 09/08/19 0331 09/08/19 0331 09/09/19 0420 09/09/19 0420 09/09/19 1811 09/09/19 1811 09/10/19 0713 09/11/19 0502  NA 142  --  141  --  144  --  146* 147*  K 4.0   < > 4.4   < > 5.0   < > 4.3 4.0  CL 105  --  109  --  114*  --  114* 117*  CO2 26  --  23  --  22  --  24 24  GLUCOSE 107*  --  98  --  130*  --  92 89  BUN 15  --  13  --  26*  --  40* 26*  CREATININE 1.53*  --  1.19*  --  1.34*  --  1.20* 1.09*  CALCIUM 7.8*  --  8.0*  --  7.1*  --  7.5* 7.9*  MG  --   --   --   --   --   --  1.9 1.9  PHOS  --   --   --   --   --   --   --  3.2   < > = values in this interval not displayed.   GFR Estimated Creatinine Clearance: 44.9 mL/min (A) (by C-G formula based on SCr of 1.09 mg/dL (H)). Liver Function Tests: Recent Labs  Lab 09/05/19 1052  AST 21  ALT 13  ALKPHOS 77  BILITOT 0.6  PROT 5.7*  ALBUMIN 2.3*   No results for input(s): LIPASE, AMYLASE in the last 168 hours. No results for input(s): AMMONIA in the last 168 hours. Coagulation profile No results for input(s): INR, PROTIME in the last 168 hours. COVID-19 Labs  No results for input(s): DDIMER, FERRITIN, LDH, CRP in the last 72 hours.  Lab Results  Component Value Date   [place] NEGATIVE 09/05/2019    CBC: Recent Labs  Lab 09/05/19 1052 09/06/19 0805 09/08/19 0331 09/08/19 0331 09/09/19 0420 09/09/19 0420 09/09/19 1513 09/09/19 1943 09/10/19 0713 09/10/19 1543 09/11/19 0502  WBC 13.1*   < > 7.0  --  6.5  --   --   --  7.5 6.4 5.8  NEUTROABS 11.2*  --   --   --   --   --   --   --   --   --   --   HGB 7.8*   < > 9.1*   < > 9.6*   < > 8.5* 7.5* 8.7* 7.7* 8.7*  HCT 26.7*   < > 29.4*   < > 30.8*   < > 28.6* 24.9* 28.7* 25.4* 28.4*  MCV 112.7*   < > 101.0*  --  101.3*  --   --   --  100.3* 99.2 98.6  PLT 470*   < > 407*  --  421*  --   --   --  300 327 282   < >  = values in this interval not displayed.   Cardiac Enzymes: No results for input(s): CKTOTAL, CKMB, CKMBINDEX, TROPONINI in the last 168 hours. BNP (last 3 results) No results for input(s): PROBNP in the last 8760 hours. CBG: Recent Labs  Lab 09/09/19 1551  GLUCAP 125*   D-Dimer: No results for input(s): DDIMER in the last 72 hours. Hgb A1c: No results for input(s): HGBA1C in the last 72 hours. Lipid Profile: No results for input(s): CHOL, HDL, LDLCALC, TRIG, CHOLHDL, LDLDIRECT in the last 72 hours. Thyroid function studies: No results for input(s): TSH, T4TOTAL, T3FREE, THYROIDAB in the last 72 hours.  Invalid input(s): FREET3 Anemia work up: No results for input(s): VITAMINB12, FOLATE, FERRITIN, TIBC, IRON, RETICCTPCT in the last 72 hours. Sepsis Labs: Recent Labs  Lab 09/05/19 1509 09/06/19 0805 09/09/19 0420 09/10/19 0713 09/10/19 1543 09/11/19 0502  PROCALCITON 0.35  --   --   --   --   --   WBC  --    < > 6.5 7.5 6.4 5.8   < > = values in this interval not displayed.   Microbiology Recent Results (from the past 240 hour(s))  Respiratory Panel by RT PCR (Flu A&B, Covid) - Nasopharyngeal Swab     Status: None   Collection Time: 09/05/19  2:57 PM   Specimen: Nasopharyngeal Swab  Result Value Ref Range Status   SARS Coronavirus 2 by RT PCR NEGATIVE NEGATIVE Final    Comment: (NOTE) SARS-CoV-2 target nucleic acids are NOT DETECTED. The SARS-CoV-2 RNA is generally detectable in upper respiratoy specimens during the acute phase of infection. The lowest concentration of SARS-CoV-2 viral copies this assay can detect is 131 copies/mL. A negative result does not preclude  SARS-Cov-2 infection and should not be used as the sole basis for treatment or other patient management decisions. A negative result may occur with  improper specimen collection/handling, submission of specimen other than nasopharyngeal swab, presence of viral mutation(s) within the areas targeted by  this assay, and inadequate number of viral copies (<131 copies/mL). A negative result must be combined with clinical observations, patient history, and epidemiological information. The expected result is Negative. Fact Sheet for Patients:  PinkCheek.be Fact Sheet for Healthcare Providers:  GravelBags.it This test is not yet ap proved or cleared by the [place] FDA and  has been authorized for detection and/or diagnosis of SARS-CoV-2 by FDA under an Emergency Use Authorization (EUA). This EUA will remain  in effect (meaning this test can be used) for the duration of the COVID-19 declaration under Section 564(b)(1) of the Act, 21 U.S.C. section 360bbb-3(b)(1), unless the authorization is terminated or revoked sooner.    Influenza A by PCR NEGATIVE NEGATIVE Final   Influenza B by PCR NEGATIVE NEGATIVE Final    Comment: (NOTE) The Xpert Xpress SARS-CoV-2/FLU/RSV assay is intended as an aid in  the diagnosis of influenza from Nasopharyngeal swab specimens and  should not be used as a sole basis for treatment. Nasal washings and  aspirates are unacceptable for Xpert Xpress SARS-CoV-2/FLU/RSV  testing. Fact Sheet for Patients: PinkCheek.be Fact Sheet for Healthcare Providers: GravelBags.it This test is not yet approved or cleared by the [place] FDA and  has been authorized for detection and/or diagnosis of SARS-CoV-2 by  FDA under an Emergency Use Authorization (EUA). This EUA will remain  in effect (meaning this test can be used) for the duration of the  Covid-19 declaration under Section 564(b)(1) of the Act, 21  U.S.C. section 360bbb-3(b)(1), unless the authorization is  terminated or revoked. Performed at [facility], [address], [place], [place] [address]   Blood culture (routine x 2)     Status: None   Collection Time: 09/05/19  4:38 PM   Specimen:  BLOOD RIGHT HAND  Result Value Ref Range Status   Specimen Description BLOOD RIGHT HAND  Final   Special Requests   Final    BOTTLES DRAWN AEROBIC AND ANAEROBIC Blood Culture adequate volume   Culture   Final    NO GROWTH 5 DAYS Performed at [facility], [address], [place], [place] [address]    Report Status 09/10/2019 FINAL  Final  Blood culture (routine x 2)     Status: None   Collection Time: 09/05/19  4:39 PM   Specimen: Left Antecubital; Blood  Result Value Ref Range Status   Specimen Description LEFT ANTECUBITAL  Final   Special Requests   Final    BOTTLES DRAWN AEROBIC AND ANAEROBIC Blood Culture adequate volume   Culture   Final    NO GROWTH 5 DAYS Performed at [facility], [address], [place], [place] [address]    Report Status 09/10/2019 FINAL  Final  MRSA PCR Screening     Status: None   Collection Time: 09/09/19  6:02 PM   Specimen: Nasal Mucosa; Nasopharyngeal  Result Value Ref Range Status   MRSA by PCR NEGATIVE NEGATIVE Final    Comment:        The GeneXpert MRSA Assay (FDA approved for NASAL specimens only), is one component of a comprehensive MRSA colonization surveillance program. It is not intended to diagnose MRSA infection nor to guide or monitor treatment for MRSA infections. Performed at [facility], [address], [place], [place] [address]      Medications:   . sodium chloride   Intravenous Once  . sodium chloride   Intravenous Once  . acyclovir  200 mg Oral BID  . atorvastatin  10 mg Oral q1800  . Chlorhexidine Gluconate Cloth  6 each Topical Daily  . fluticasone  2 spray Each Nare Daily  . montelukast  10 mg Oral QHS  . pantoprazole (PROTONIX) IV  40 mg Intravenous Q12H  . potassium chloride  10 mEq Oral TID  . sodium chloride flush  3 mL Intravenous Q12H  . umeclidinium-vilanterol  1 puff Inhalation Daily   Continuous Infusions: . sodium chloride    . sodium chloride    . sodium chloride        LOS: 6 days    Charlynne Cousins  [facility]  09/11/2019, 7:07 AM

## 2019-09-11 NOTE — TOC Progression Note (Signed)
Transition of Care [facility]) - Progression Note    Patient Details  Name: NAIDELYN PARRELLA MRN: [id number] Date of Birth: 1941-06-14  Transition of [facility]) CM/SW Contact  Ella Bodo, RN Phone Number: 09/11/2019, 4:15 PM  Clinical Narrative:  Met with pt and son to discuss discharge planning:  Introduced myself and TOC role...son promptly dismissed me, stating that he "does not want to talk about discharge until he can figure out what is going on with his mother." He states he has not spoken to the doctor today, as MD came in before visiting hours started.  He would prefer that we discuss dc planning at another time.  Will follow progress with therapies/ attempt to discuss dc needs as able.       Expected Discharge Plan: [place] Barriers to Discharge: Continued Medical Work up  Expected Discharge Plan and Services Expected Discharge Plan: [place] In-house Referral: NA Discharge Planning Services: CM Consult   Living arrangements for the past 2 months: Single Family Home                                       Social Determinants of Health (SDOH) Interventions    Readmission Risk Interventions No flowsheet data found.  Reinaldo Raddle, RN, BSN  Trauma/Neuro ICU Case Manager [phone]

## 2019-09-12 LAB — CBC
HCT: 24.7 % — ABNORMAL LOW (ref 36.0–46.0)
Hemoglobin: 7.5 g/dL — ABNORMAL LOW (ref 12.0–15.0)
MCH: 30.4 pg (ref 26.0–34.0)
MCHC: 30.4 g/dL (ref 30.0–36.0)
MCV: 100 fL (ref 80.0–100.0)
Platelets: 265 10*3/uL (ref 150–400)
RBC: 2.47 MIL/uL — ABNORMAL LOW (ref 3.87–5.11)
RDW: 21.1 % — ABNORMAL HIGH (ref 11.5–15.5)
WBC: 6.1 10*3/uL (ref 4.0–10.5)
nRBC: 0.3 % — ABNORMAL HIGH (ref 0.0–0.2)

## 2019-09-12 LAB — PREPARE RBC (CROSSMATCH)

## 2019-09-12 MED ORDER — SODIUM CHLORIDE 0.9% IV SOLUTION: Freq: Once | INTRAVENOUS | Status: AC

## 2019-09-12 NOTE — Plan of Care (Signed)
  Problem: Education: Goal: Ability to demonstrate management of disease process will improve Outcome: Progressing   Problem: Education: Goal: Ability to verbalize understanding of medication therapies will improve Outcome: Progressing   

## 2019-09-12 NOTE — Progress Notes (Signed)
Son arrived to patients room asking for update from MD. She is presently receiving a unit of PRBC's without diffulculty for hgb down to 7.5. She has had black tarry stools this am she gets up with SBA to [facility] without diffulculty. Patient is alert oriented without c/o's. No further changes noted.

## 2019-09-12 NOTE — Progress Notes (Signed)
[facility] PROGRESS NOTE    Progress Note  Carol Bradley  WEX:[id number] DOB: 1940/07/28 DOA: 09/05/2019 PCP: [name], MD     Brief Narrative:   Carol Bradley is an 79 y.o. female past medical history of multiple myeloma, chronic anemia COPD, CAD, essential hypertension and chronic kidney disease stage III was found to be hypoxic with nausea and vomiting with a hemoglobin of 7.8 being treated for anemia heart failure and pneumonia status post 1 unit of packed red blood cells on 09/06/2019, Covid negative procalcitonin 0.35, chest x-ray showed bilateral effusion with interstitial thickening.  She was started empirically on Rocephin and azithromycin she was noted to have elevated troponins and 2D echo was done that did not show any wall motion abnormality with an EF of 55%.  She was diuresed on 09/09/2019 she developed worsening nausea with severe bloody emesis and hypotension aspirin and Lovenox were held she was started on IV Protonix she was fluid resuscitated.  GI was consulted and PCCM and transferred to the unit.  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  GI recommended conservative management continue IV Protonix avoid NSAIDs.  Assessment/Plan:   Hemorrhagic shock with hematemesis suspect upper GI bleed/ Symptomatic anemia Status post EGD on 09/10/2019 that showed an dysplastic nonbleeding duodenal lesion. Avoid NSAIDs continue IV Protonix. She is status post 1 unit of packed red Blood cells on 09/10/2019 her hemoglobin improved now 15 down to 7.5 she appears pale, she relates she feels tired and fatigue and she was significantly winded working with physical therapy, sitting at rest going to the chair that makes her winded. Transfuse an additional unit of packed red blood cells check a CBC tomorrow morning. Appreciate GIs assistance. Will monitor for 24 hours check a CBC in the morning. Advance diet physical therapy evaluated the  patient and recommended home health.  Acute respiratory failure with hypoxia probably secondary to community-acquired pneumonia: She has completed her course of IV Rocephin and azithromycin. Continue pulmonary toileting's and inhalers. Culture data has remained negative till date.  Acute kidney injury on chronic kidney disease stage IIIa: Likely prerenal azotemia in the setting of infectious etiology now resolved.  Essential hypertension/acute on chronic diastolic heart failure (with an EF of 55%): Currently n.p.o. antihypertensive medications have been held in the setting of hemorrhagic shock. Now that she is tolerating her diet will start her Lipitor her blood pressure is borderline continue to hold antihypertensive medication.  CAD (coronary artery disease) Continue Lipitor.  Multiple myeloma (HCC) Continue chronic acyclovir, she is on prednisone for her multiple myeloma treatment. She will probably need Protonix as an outpatient.  Emphysema/COPD ([place]) No wheezing continue current regimen.  Elevated troponin Likely demand ischemia in the setting of chronic renal disease. The patient denies chest pain.  New hypernatremia: Start D5w. Recheck a b-me tin am. Likely hypovolemic.   DVT prophylaxis: SCD's Family Communication:none Disposition Plan/Barrier to D/C: She came from home physical therapy evaluated her and relates she could go home with physical therapy, will transfuse 1 unit of packed red blood cells and recheck a CBC tomorrow morning she can probably go on 09/13/2019.  Code Status:     Code Status Orders  (From admission, onward)         Start     Ordered   09/05/19 2141  Full code  Continuous     09/05/19 2140        Code Status History  Date Active Date Inactive Code Status Order ID Comments User Context   04/14/2016 1103 04/14/2016 2011 Full Code [id number]  Kathie Dike, MD Inpatient   Advance Care Planning Activity    Advance Directive Documentation      Most Recent Value  Type of Advance Directive  Healthcare Power of Attorney, Living will  Pre-existing out of facility DNR order (yellow form or pink MOST form)  --  "MOST" Form in Place?  --        IV Access:    Peripheral IV   Procedures and diagnostic studies:   No results found.   Medical Consultants:    None.  Anti-Infectives:   Rocephin/azithro  Subjective:    Carol Bradley relates she significantly tired and fatigue no abdominal pain no appetite.  Objective:    Vitals:   09/12/19 0000 09/12/19 0058 09/12/19 0119 09/12/19 0257  BP: (!) 125/59 (!) 132/50  (!) 105/48  Pulse: (!) 102 92  (!) 103  Resp: (!) _0 Temp:  98.6 F (37 C)  98.2 F (36.8 C)  TempSrc:  Oral  Oral  SpO2: 96% 100%  97%  Weight:   77 kg   Height:   5' 7" (1.702 m)    SpO2: 97 % O2 Flow Rate (L/min): 4 L/min FiO2 (%): 32 %   Intake/Output Summary (Last 24 hours) at 09/12/2019 0730 Last data filed at 09/12/2019 0200 Gross per 24 hour  Intake 727.5 ml  Output 300 ml  Net 427.5 ml   Filed Weights   09/09/19 0459 09/10/19 0500 09/12/19 0119  Weight: 74.5 kg 78.2 kg 77 kg    Exam: General exam: In no acute distress, pale appearing tired Respiratory system: Good air movement and clear to auscultation. Cardiovascular system: S1 & S2 heard, RRR. No JVD. Gastrointestinal system: Abdomen is nondistended, soft and nontender.  Extremities: No pedal edema. Skin: No rashes, lesions or ulcers Psychiatry: The patient has good judgment and insight for her age, her mood and affect are appropriate.  Data Reviewed:    Labs: Basic Metabolic Panel: Recent Labs  Lab 09/08/19 0331 09/08/19 0331 09/09/19 0420 09/09/19 0420 09/09/19 1811 09/09/19 1811 09/10/19 0713 09/11/19 0502  NA 142  --  141  --  144  --  146* 147*  K 4.0   < > 4.4   < > 5.0   < > 4.3 4.0  CL 105  --  109  --  114*  --  114* 117*  CO2 26  --  23  --  22  --  24 24  GLUCOSE 107*  --  98  --  130*   --  92 89  BUN 15  --  13  --  26*  --  40* 26*  CREATININE 1.53*  --  1.19*  --  1.34*  --  1.20* 1.09*  CALCIUM 7.8*  --  8.0*  --  7.1*  --  7.5* 7.9*  MG  --   --   --   --   --   --  1.9 1.9  PHOS  --   --   --   --   --   --   --  3.2   < > = values in this interval not displayed.   GFR Estimated Creatinine Clearance: 45.5 mL/min (A) (by C-G formula based on SCr of 1.09 mg/dL (H)). Liver Function Tests: Recent Labs  Lab 09/05/19 1052  AST 21  ALT 13  ALKPHOS 77  BILITOT 0.6  PROT 5.7*  ALBUMIN 2.3*   No results for input(s): LIPASE, AMYLASE in the last 168 hours. No results for input(s): AMMONIA in the last 168 hours. Coagulation profile No results for input(s): INR, PROTIME in the last 168 hours. COVID-19 Labs  No results for input(s): DDIMER, FERRITIN, LDH, CRP in the last 72 hours.  Lab Results  Component Value Date   [place] NEGATIVE 09/05/2019    CBC: Recent Labs  Lab 09/05/19 1052 09/06/19 0805 09/09/19 0420 09/09/19 1513 09/09/19 1943 09/10/19 0713 09/10/19 1543 09/11/19 0502 09/12/19 0342  WBC 13.1*   < > 6.5  --   --  7.5 6.4 5.8 6.1  NEUTROABS 11.2*  --   --   --   --   --   --   --   --   HGB 7.8*   < > 9.6*   < > 7.5* 8.7* 7.7* 8.7* 7.5*  HCT 26.7*   < > 30.8*   < > 24.9* 28.7* 25.4* 28.4* 24.7*  MCV 112.7*   < > 101.3*  --   --  100.3* 99.2 98.6 100.0  PLT 470*   < > 421*  --   --  300 327 282 265   < > = values in this interval not displayed.   Cardiac Enzymes: No results for input(s): CKTOTAL, CKMB, CKMBINDEX, TROPONINI in the last 168 hours. BNP (last 3 results) No results for input(s): PROBNP in the last 8760 hours. CBG: Recent Labs  Lab 09/09/19 1551  GLUCAP 125*   D-Dimer: No results for input(s): DDIMER in the last 72 hours. Hgb A1c: No results for input(s): HGBA1C in the last 72 hours. Lipid Profile: No results for input(s): CHOL, HDL, LDLCALC, TRIG, CHOLHDL, LDLDIRECT in the last 72 hours. Thyroid function  studies: No results for input(s): TSH, T4TOTAL, T3FREE, THYROIDAB in the last 72 hours.  Invalid input(s): FREET3 Anemia work up: No results for input(s): VITAMINB12, FOLATE, FERRITIN, TIBC, IRON, RETICCTPCT in the last 72 hours. Sepsis Labs: Recent Labs  Lab 09/05/19 1509 09/06/19 0805 09/10/19 0713 09/10/19 1543 09/11/19 0502 09/12/19 0342  PROCALCITON 0.35  --   --   --   --   --   WBC  --    < > 7.5 6.4 5.8 6.1   < > = values in this interval not displayed.   Microbiology Recent Results (from the past 240 hour(s))  Respiratory Panel by RT PCR (Flu A&B, Covid) - Nasopharyngeal Swab     Status: None   Collection Time: 09/05/19  2:57 PM   Specimen: Nasopharyngeal Swab  Result Value Ref Range Status   SARS Coronavirus 2 by RT PCR NEGATIVE NEGATIVE Final    Comment: (NOTE) SARS-CoV-2 target nucleic acids are NOT DETECTED. The SARS-CoV-2 RNA is generally detectable in upper respiratoy specimens during the acute phase of infection. The lowest concentration of SARS-CoV-2 viral copies this assay can detect is 131 copies/mL. A negative result does not preclude SARS-Cov-2 infection and should not be used as the sole basis for treatment or other patient management decisions. A negative result may occur with  improper specimen collection/handling, submission of specimen other than nasopharyngeal swab, presence of viral mutation(s) within the areas targeted by this assay, and inadequate number of viral copies (<131 copies/mL). A negative result must be combined with clinical observations, patient history, and epidemiological information. The expected result is Negative. Fact Sheet for Patients:  PinkCheek.be Fact Sheet for  Healthcare Providers:  GravelBags.it This test is not yet ap proved or cleared by the [place] and  has been authorized for detection and/or diagnosis of SARS-CoV-2 by FDA under an Emergency Use  Authorization (EUA). This EUA will remain  in effect (meaning this test can be used) for the duration of the COVID-19 declaration under Section 564(b)(1) of the Act, 21 U.S.C. section 360bbb-3(b)(1), unless the authorization is terminated or revoked sooner.    Influenza A by PCR NEGATIVE NEGATIVE Final   Influenza B by PCR NEGATIVE NEGATIVE Final    Comment: (NOTE) The Xpert Xpress SARS-CoV-2/FLU/RSV assay is intended as an aid in  the diagnosis of influenza from Nasopharyngeal swab specimens and  should not be used as a sole basis for treatment. Nasal washings and  aspirates are unacceptable for Xpert Xpress SARS-CoV-2/FLU/RSV  testing. Fact Sheet for Patients: PinkCheek.be Fact Sheet for Healthcare Providers: GravelBags.it This test is not yet approved or cleared by the [place] FDA and  has been authorized for detection and/or diagnosis of SARS-CoV-2 by  FDA under an Emergency Use Authorization (EUA). This EUA will remain  in effect (meaning this test can be used) for the duration of the  Covid-19 declaration under Section 564(b)(1) of the Act, 21  U.S.C. section 360bbb-3(b)(1), unless the authorization is  terminated or revoked. Performed at [facility], [address] [place], [place] [address]   Blood culture (routine x 2)     Status: None   Collection Time: 09/05/19  4:38 PM   Specimen: BLOOD RIGHT HAND  Result Value Ref Range Status   Specimen Description BLOOD RIGHT HAND  Final   Special Requests   Final    BOTTLES DRAWN AEROBIC AND ANAEROBIC Blood Culture adequate volume   Culture   Final    NO GROWTH 5 DAYS Performed at [facility], [address], [place], [place] [address]    Report Status 09/10/2019 FINAL  Final  Blood culture (routine x 2)     Status: None   Collection Time: 09/05/19  4:39 PM   Specimen: Left Antecubital; Blood  Result Value Ref Range Status   Specimen Description LEFT ANTECUBITAL   Final   Special Requests   Final    BOTTLES DRAWN AEROBIC AND ANAEROBIC Blood Culture adequate volume   Culture   Final    NO GROWTH 5 DAYS Performed at [facility], [address] [place], [place] [address]    Report Status 09/10/2019 FINAL  Final  MRSA PCR Screening     Status: None   Collection Time: 09/09/19  6:02 PM   Specimen: Nasal Mucosa; Nasopharyngeal  Result Value Ref Range Status   MRSA by PCR NEGATIVE NEGATIVE Final    Comment:        The GeneXpert MRSA Assay (FDA approved for NASAL specimens only), is one component of a comprehensive MRSA colonization surveillance program. It is not intended to diagnose MRSA infection nor to guide or monitor treatment for MRSA infections. Performed at [facility], [place] [address], [place], [place] [address]      Medications:   . sodium chloride   Intravenous Once  . sodium chloride   Intravenous Once  . acyclovir  200 mg Oral BID  . atorvastatin  10 mg Oral q1800  . Chlorhexidine Gluconate Cloth  6 each Topical Daily  . diltiazem  240 mg Oral Daily  . fluticasone  2 spray Each Nare Daily  . metoprolol tartrate  37.5 mg Oral  BID  . montelukast  10 mg Oral QHS  . pantoprazole  40 mg Oral BID AC  . potassium chloride  10 mEq Oral TID  . sodium chloride flush  3 mL Intravenous Q12H  . umeclidinium-vilanterol  1 puff Inhalation Daily   Continuous Infusions: . sodium chloride    . sodium chloride    . sodium chloride        LOS: 7 days   Charlynne Cousins  [facility]  09/12/2019, 7:30 AM

## 2019-09-12 NOTE — Progress Notes (Signed)
MD called son back gave him update and discussed treatment plan with son. No further changes noted.

## 2019-09-12 NOTE — Care Management Important Message (Signed)
Important Message  Patient Details  Name: Carol Bradley MRN: [id number] Date of Birth: 1940-12-06   Medicare Important Message Given:  Yes     Shelda Altes 09/12/2019, 12:53 PM

## 2019-09-12 NOTE — Progress Notes (Signed)
Physical Therapy Treatment Patient Details Name: Carol Bradley MRN: [id number] DOB: [birth year] Today's Date: 09/12/2019    History of Present Illness Pt is a 79 yo female s/p 7 days of SOB, N/V. Found to be anemic, EF 55-60% and requiring O2. PMHx: CAD, myeloma, HTN, COPD, back sx, neck sx.  Developed hematemesis and hypotension on 09/09/19 and was transferred to ICU.  Underwent EGD 09/10/19.    PT Comments    Patient seen for mobilitiy progression. Pt in bed upon arrival and without O2 donned and SpO2 77%. 4L O2 via [place] donned and SpO2 up to 92% before mobilizing and maintained >90% with ambulation in room. Pt will continue to benefit from further skilled PT services to maximize independence and safety with mobility.     Follow Up Recommendations  Supervision/Assistance - 24 hour;SNF;Home health PT(depending on progress, if has 24 hour assist)     Equipment Recommendations  None recommended by PT    Recommendations for Other Services       Precautions / Restrictions Precautions Precautions: Fall Precaution Comments: watch O2, watch HR Restrictions Weight Bearing Restrictions: No    Mobility  Bed Mobility Overal bed mobility: Needs Assistance Bed Mobility: Supine to Sit     Supine to sit: HOB elevated;Supervision     General bed mobility comments: supervision for safety; use of rail  Transfers Overall transfer level: Needs assistance Equipment used: Rolling walker (2 wheeled) Transfers: Sit to/from Stand Sit to Stand: Min guard;Min assist         General transfer comment: increased assist to power up from low commode   Ambulation/Gait Ambulation/Gait assistance: Min guard Gait Distance (Feet): 30 Feet Assistive device: Rolling walker (2 wheeled) Gait Pattern/deviations: Step-through pattern;Trunk flexed;Decreased stride length Gait velocity: decreased   General Gait Details: cues for PLB and upright posture   Stairs             Wheelchair Mobility     Modified Rankin (Stroke Patients Only)       Balance Overall balance assessment: Needs assistance Sitting-balance support: No upper extremity supported;Feet supported Sitting balance-Leahy Scale: Good     Standing balance support: Bilateral upper extremity supported;During functional activity Standing balance-Leahy Scale: Poor                              Cognition Arousal/Alertness: Awake/alert Behavior During Therapy: WFL for tasks assessed/performed Overall Cognitive Status: Within Functional Limits for tasks assessed                                        Exercises      General Comments        Pertinent Vitals/Pain Pain Assessment: No/denies pain    Home Living                      Prior Function            PT Goals (current goals can now be found in the care plan section) Progress towards PT goals: Progressing toward goals    Frequency    Min 3X/week      PT Plan Current plan remains appropriate    Co-evaluation              AM-PAC PT "6 Clicks" Mobility   Outcome Measure  Help needed turning from your back to  your side while in a flat bed without using bedrails?: A Little Help needed moving from lying on your back to sitting on the side of a flat bed without using bedrails?: A Little Help needed moving to and from a bed to a chair (including a wheelchair)?: A Little Help needed standing up from a chair using your arms (e.g., wheelchair or bedside chair)?: A Little Help needed to walk in hospital room?: A Little Help needed climbing 3-5 steps with a railing? : A Little 6 Click Score: 18    End of Session Equipment Utilized During Treatment: Oxygen Activity Tolerance: Patient tolerated treatment well Patient left: with call bell/phone within reach;in chair;with family/visitor present;Other (comment)(RN notified no chair alarm in room) Nurse Communication: Mobility status PT Visit Diagnosis: Muscle  weakness (generalized) (M62.81)     Time: 1400-1430 PT Time Calculation (min) (ACUTE ONLY): 30 min  Charges:  $Gait Training: 8-22 mins $Therapeutic Activity: 8-22 mins                     Earney Navy, PTA Acute Rehabilitation Services Pager: [phone] Office: [phone]     Darliss Cheney 09/12/2019, 5:35 PM

## 2019-09-12 NOTE — Progress Notes (Signed)
PT Cancellation Note  Patient Details Name: Carol Bradley MRN: [id number] DOB: 1941/05/02   Cancelled Treatment:    Reason Eval/Treat Not Completed: Patient declined, no reason specified;Other (comment) Pt declined in AM due to wanting blood transfusion done first and then family declined in PM as pt napping. Will follow.   Marguarite Arbour A Aleksey Newbern 09/12/2019, 1:44 PM Marisa Severin, PT, DPT Acute Rehabilitation Services Pager [phone] Office [phone]

## 2019-09-12 NOTE — Progress Notes (Signed)
Patient arrived to unit, helped transfer to [facility]- one assist with walker- dyspneic on exertion.   Patient has no complaints. Will continue to monitor throughout the shift.

## 2019-09-12 NOTE — Progress Notes (Signed)
OT Cancellation Note  Patient Details Name: Carol Bradley MRN: [id number] DOB: 11/13/1940   Cancelled Treatment:     Upon arrival patient eating lunch and receiving blood transfusion. Will re-attempt as schedule permits.  [place] OT office: Dwight Mission 09/12/2019, 12:08 PM

## 2019-09-13 LAB — BPAM RBC
Blood Product Expiration Date: 202104062359
Blood Product Expiration Date: 202104082359
Blood Product Expiration Date: 202104112359
Blood Product Expiration Date: 202104112359
ISSUE DATE / TIME: 202103021950
ISSUE DATE / TIME: 202103032251
ISSUE DATE / TIME: 202103050934
Unit Type and Rh: 5100
Unit Type and Rh: 5100
Unit Type and Rh: 5100
Unit Type and Rh: 5100

## 2019-09-13 LAB — TYPE AND SCREEN
ABO/RH(D): O POS
Antibody Screen: POSITIVE
Unit division: 0
Unit division: 0
Unit division: 0
Unit division: 0

## 2019-09-13 LAB — CBC
HCT: 29.9 % — ABNORMAL LOW (ref 36.0–46.0)
Hemoglobin: 9.3 g/dL — ABNORMAL LOW (ref 12.0–15.0)
MCH: 30.7 pg (ref 26.0–34.0)
MCHC: 31.1 g/dL (ref 30.0–36.0)
MCV: 98.7 fL (ref 80.0–100.0)
Platelets: 268 10*3/uL (ref 150–400)
RBC: 3.03 MIL/uL — ABNORMAL LOW (ref 3.87–5.11)
RDW: 20.5 % — ABNORMAL HIGH (ref 11.5–15.5)
WBC: 6.2 10*3/uL (ref 4.0–10.5)
nRBC: 0 % (ref 0.0–0.2)

## 2019-09-13 LAB — BASIC METABOLIC PANEL
Anion gap: 9 (ref 5–15)
BUN: 16 mg/dL (ref 8–23)
CO2: 27 mmol/L (ref 22–32)
Calcium: 8.3 mg/dL — ABNORMAL LOW (ref 8.9–10.3)
Chloride: 111 mmol/L (ref 98–111)
Creatinine, Ser: 1.16 mg/dL — ABNORMAL HIGH (ref 0.44–1.00)
GFR calc Af Amer: 52 mL/min — ABNORMAL LOW (ref >60)
GFR calc non Af Amer: 45 mL/min — ABNORMAL LOW (ref >60)
Glucose, Bld: 102 mg/dL — ABNORMAL HIGH (ref 70–99)
Potassium: 4.1 mmol/L (ref 3.5–5.1)
Sodium: 147 mmol/L — ABNORMAL HIGH (ref 135–145)

## 2019-09-13 MED ORDER — FUROSEMIDE 20 MG PO TABS
20.0000 mg | ORAL_TABLET | Freq: Every day | ORAL | Status: DC
  Administered 2019-09-13: 20 mg via ORAL
  Filled 2019-09-13: qty 1

## 2019-09-13 MED ORDER — DRONABINOL 2.5 MG PO CAPS
2.5000 mg | ORAL_CAPSULE | Freq: Every day | ORAL | Status: DC
  Administered 2019-09-13 – 2019-09-14 (×2): 2.5 mg via ORAL
  Filled 2019-09-13 (×2): qty 1

## 2019-09-13 NOTE — Plan of Care (Signed)
Patient resting in bed. Patient complains of peri-area feeling raw- examined area is pink/red but intact. Applied barrier cream and power. Will continue to monitor. Problem: Education: Goal: Ability to demonstrate management of disease process will improve Outcome: Progressing Goal: Ability to verbalize understanding of medication therapies will improve Outcome: Progressing Goal: Individualized Educational Video(s) Outcome: Progressing   Problem: Activity: Goal: Capacity to carry out activities will improve Outcome: Progressing   Problem: Cardiac: Goal: Ability to achieve and maintain adequate cardiopulmonary perfusion will improve Outcome: Progressing   Problem: Education: Goal: Knowledge of General Education information will improve Description: Including pain rating scale, medication(s)/side effects and non-pharmacologic comfort measures Outcome: Progressing   Problem: Health Behavior/Discharge Planning: Goal: Ability to manage health-related needs will improve Outcome: Progressing   Problem: Clinical Measurements: Goal: Ability to maintain clinical measurements within normal limits will improve Outcome: Progressing Goal: Will remain free from infection Outcome: Progressing Goal: Diagnostic test results will improve Outcome: Progressing Goal: Respiratory complications will improve Outcome: Progressing Goal: Cardiovascular complication will be avoided Outcome: Progressing   Problem: Activity: Goal: Risk for activity intolerance will decrease Outcome: Progressing   Problem: Nutrition: Goal: Adequate nutrition will be maintained Outcome: Progressing   Problem: Coping: Goal: Level of anxiety will decrease Outcome: Progressing   Problem: Elimination: Goal: Will not experience complications related to bowel motility Outcome: Progressing Goal: Will not experience complications related to urinary retention Outcome: Progressing   Problem: Pain Managment: Goal:  General experience of comfort will improve Outcome: Progressing   Problem: Safety: Goal: Ability to remain free from injury will improve Outcome: Progressing   Problem: Skin Integrity: Goal: Risk for impaired skin integrity will decrease Outcome: Progressing

## 2019-09-13 NOTE — Progress Notes (Signed)
PROGRESS NOTE    Carol Bradley  UYQ:[id number] DOB: Mar 08, 1941 DOA: 09/05/2019 PCP: [name], MD     Brief Narrative:  Carol Bradley is a 79 y.o. 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  Patient presents with 7-day history of worsening weakness with shortness of breath, nausea, vomiting.  She was seen and evaluated at the cancer center, found to be hypoxic to 56% on room air and was found to be quite anemic with hemoglobin of 7.8. -She was admitted and transfused, subsequently had acute hematemesis with hypotension, was transferred to the ICU, transfused PRBC, GI consulted, underwent endoscopy which noted gastric ulcer and 2 AVMs  New events last 24 hours / Subjective: -No events overnight, feels better overall, denies any shortness of breath -No melena or hematemesis reported  Assessment & Plan:   Hemorrhagic shock Acute Blood loss anemia -Secondary to gastric ulcer, AVMs,  -Gastroenterology consulted, underwent endoscopy 3/3 which showed 1 nonbleeding gastric ulcer and 2 small AVMs -No further active bleeding, transition to oral PPI now -Also has chronic anemia at baseline secondary to multiple myeloma -Hemoglobin stable and improving -PT OT, ambulate, may need rehab -Reevaluate as tolerated  Acute hypoxemic respiratory failure Acute on chronic diastolic heart failure -Echocardiogram 2/26: EF 55 to 60%, with grade 1 diastolic dysfunction -Likely exacerbated by anemia -Diuresed with IV Lasix earlier this admission -P.o. Lasix x1 today -Wean O2, now on 4 L  ?CAP -Procalcitonin 0.35 -Influenza and COVID-19 negative. Patient due for her second COVID vaccine on 3/6 (Moderna) -Chest x-ray 2/26: Bilateral diffuse interstitial thickening which may reflect interstitial edema versus multilobar pneumonia -Treated with IV ceftriaxone and  azithromycin with this admission, completed antibiotics, doubt pneumonia, suspect this was secondary to fluid  AKI on CKD stage 3a -Baseline creatinine around 1.3-1.4 -Improved  Elevated troponin -Due to anemia, CHF -Echocardiogram did not reveal regional wall motion abnormalities  Multiple myeloma -Followed by Dr. Delton Coombes  CAD status post PCI/DES in 2012 -Continue aspirin, Lipitor, Lopressor dose decreased due to mild hypotension  Essential hypertension -Continue Cardizem, Lopressor doses decreased due to mild hypotension  Anorexia -Add low-dose Marinol  DVT prophylaxis: Lovenox Code Status: Full  Family Communication: No family at bedside . Disposition Plan: May need rehab, pending improvement in respiratory status, hypoxemia   Consultants:   None  Procedures:   None   Antimicrobials:  Anti-infectives (From admission, onward)   Start     Dose/Rate Route Frequency Ordered Stop   09/06/19 1030  azithromycin (ZITHROMAX) 500 mg in sodium chloride 0.9 % 250 mL IVPB     500 mg 250 mL/hr over 60 Minutes Intravenous Every 24 hours 09/05/19 2140 09/10/19 1206   09/06/19 1000  cefTRIAXone (ROCEPHIN) 2 g in sodium chloride 0.9 % 100 mL IVPB     2 g 200 mL/hr over 30 Minutes Intravenous Every 24 hours 09/05/19 2140 09/10/19 1038   09/05/19 2200  acyclovir (ZOVIRAX) 200 MG capsule 200 mg     200 mg Oral 2 times daily 09/05/19 2140     09/05/19 1515  cefTRIAXone (ROCEPHIN) 1 g in sodium chloride 0.9 % 100 mL IVPB     1 g 200 mL/hr over 30 Minutes Intravenous  Once 09/05/19 1458 09/05/19 1608   09/05/19 1500  azithromycin (ZITHROMAX) tablet 500 mg     500 mg Oral  Once 09/05/19 1458 09/05/19 1538  Objective: Vitals:   09/12/19 2100 09/13/19 0535 09/13/19 0840 09/13/19 0853  BP: 114/60 99/63 (!) 126/56   Pulse: 99 98 77 92  Resp: '18 18 20 18  '$ Temp: 98.4 F (36.9 C) 97.9 F (36.6 C) (!) 97.5 F (36.4 C)   TempSrc: Oral Oral Oral   SpO2: 99% 95% 92%    Weight:  76.5 kg    Height:        Intake/Output Summary (Last 24 hours) at 09/13/2019 1132 Last data filed at 09/13/2019 1034 Gross per 24 hour  Intake 989 ml  Output 1791 ml  Net -802 ml   Filed Weights   09/10/19 0500 09/12/19 0119 09/13/19 0535  Weight: 78.2 kg 77 kg 76.5 kg    Examination: General exam: Elderly female sitting up in bed, AAOx3, no distress  respiratory system: Diminished breath sounds the bases, otherwise clear Cardiovascular system: S1 & S2 heard, RRR.  Gastrointestinal system: Abdomen is nondistended, soft and nontender. Normal bowel sounds heard. Central nervous system: Alert and oriented. Non focal exam. Speech clear  Extremities: No edema Skin: No rashes, lesions or ulcers on exposed skin  Psychiatry: Judgement and insight appear stable. Mood & affect appropriate.   Data Reviewed: I have personally reviewed following labs and imaging studies  CBC: Recent Labs  Lab 09/10/19 0713 09/10/19 1543 09/11/19 0502 09/12/19 0342 09/13/19 0509  WBC 7.5 6.4 5.8 6.1 6.2  HGB 8.7* 7.7* 8.7* 7.5* 9.3*  HCT 28.7* 25.4* 28.4* 24.7* 29.9*  MCV 100.3* 99.2 98.6 100.0 98.7  PLT 300 327 282 265 503   Basic Metabolic Panel: Recent Labs  Lab 09/09/19 0420 09/09/19 1811 09/10/19 0713 09/11/19 0502 09/13/19 0815  NA 141 144 146* 147* 147*  K 4.4 5.0 4.3 4.0 4.1  CL 109 114* 114* 117* 111  CO2 '23 22 24 24 27  '$ GLUCOSE 98 130* 92 89 102*  BUN 13 26* 40* 26* 16  CREATININE 1.19* 1.34* 1.20* 1.09* 1.16*  CALCIUM 8.0* 7.1* 7.5* 7.9* 8.3*  MG  --   --  1.9 1.9  --   PHOS  --   --   --  3.2  --    GFR: Estimated Creatinine Clearance: 42.7 mL/min (A) (by C-G formula based on SCr of 1.16 mg/dL (H)). Liver Function Tests: No results for input(s): AST, ALT, ALKPHOS, BILITOT, PROT, ALBUMIN in the last 168 hours. No results for input(s): LIPASE, AMYLASE in the last 168 hours. No results for input(s): AMMONIA in the last 168 hours. Coagulation Profile: No results  for input(s): INR, PROTIME in the last 168 hours. Cardiac Enzymes: No results for input(s): CKTOTAL, CKMB, CKMBINDEX, TROPONINI in the last 168 hours. BNP (last 3 results) No results for input(s): PROBNP in the last 8760 hours. HbA1C: No results for input(s): HGBA1C in the last 72 hours. CBG: Recent Labs  Lab 09/09/19 1551  GLUCAP 125*   Lipid Profile: No results for input(s): CHOL, HDL, LDLCALC, TRIG, CHOLHDL, LDLDIRECT in the last 72 hours. Thyroid Function Tests: No results for input(s): TSH, T4TOTAL, FREET4, T3FREE, THYROIDAB in the last 72 hours. Anemia Panel: No results for input(s): VITAMINB12, FOLATE, FERRITIN, TIBC, IRON, RETICCTPCT in the last 72 hours. Sepsis Labs: No results for input(s): PROCALCITON, LATICACIDVEN in the last 168 hours.  Recent Results (from the past 240 hour(s))  Respiratory Panel by RT PCR (Flu A&B, Covid) - Nasopharyngeal Swab     Status: None   Collection Time: 09/05/19  2:57 PM   Specimen: Nasopharyngeal Swab  Result Value Ref Range Status   SARS Coronavirus 2 by RT PCR NEGATIVE NEGATIVE Final    Comment: (NOTE) SARS-CoV-2 target nucleic acids are NOT DETECTED. The SARS-CoV-2 RNA is generally detectable in upper respiratoy specimens during the acute phase of infection. The lowest concentration of SARS-CoV-2 viral copies this assay can detect is 131 copies/mL. A negative result does not preclude SARS-Cov-2 infection and should not be used as the sole basis for treatment or other patient management decisions. A negative result may occur with  improper specimen collection/handling, submission of specimen other than nasopharyngeal swab, presence of viral mutation(s) within the areas targeted by this assay, and inadequate number of viral copies (<131 copies/mL). A negative result must be combined with clinical observations, patient history, and epidemiological information. The expected result is Negative. Fact Sheet for Patients:   PinkCheek.be Fact Sheet for Healthcare Providers:  GravelBags.it This test is not yet ap proved or cleared by the [place] FDA and  has been authorized for detection and/or diagnosis of SARS-CoV-2 by FDA under an Emergency Use Authorization (EUA). This EUA will remain  in effect (meaning this test can be used) for the duration of the COVID-19 declaration under Section 564(b)(1) of the Act, 21 U.S.C. section 360bbb-3(b)(1), unless the authorization is terminated or revoked sooner.    Influenza A by PCR NEGATIVE NEGATIVE Final   Influenza B by PCR NEGATIVE NEGATIVE Final    Comment: (NOTE) The Xpert Xpress SARS-CoV-2/FLU/RSV assay is intended as an aid in  the diagnosis of influenza from Nasopharyngeal swab specimens and  should not be used as a sole basis for treatment. Nasal washings and  aspirates are unacceptable for Xpert Xpress SARS-CoV-2/FLU/RSV  testing. Fact Sheet for Patients: PinkCheek.be Fact Sheet for Healthcare Providers: GravelBags.it This test is not yet approved or cleared by the [place] FDA and  has been authorized for detection and/or diagnosis of SARS-CoV-2 by  FDA under an Emergency Use Authorization (EUA). This EUA will remain  in effect (meaning this test can be used) for the duration of the  Covid-19 declaration under Section 564(b)(1) of the Act, 21  U.S.C. section 360bbb-3(b)(1), unless the authorization is  terminated or revoked. Performed at [facility], [address], [place], [place] [address]   Blood culture (routine x 2)     Status: None   Collection Time: 09/05/19  4:38 PM   Specimen: BLOOD RIGHT HAND  Result Value Ref Range Status   Specimen Description BLOOD RIGHT HAND  Final   Special Requests   Final    BOTTLES DRAWN AEROBIC AND ANAEROBIC Blood Culture adequate volume   Culture   Final    NO GROWTH 5 DAYS Performed  at [facility], [address], [place], [place] [address]    Report Status 09/10/2019 FINAL  Final  Blood culture (routine x 2)     Status: None   Collection Time: 09/05/19  4:39 PM   Specimen: Left Antecubital; Blood  Result Value Ref Range Status   Specimen Description LEFT ANTECUBITAL  Final   Special Requests   Final    BOTTLES DRAWN AEROBIC AND ANAEROBIC Blood Culture adequate volume   Culture   Final    NO GROWTH 5 DAYS Performed at [facility], [address] [place], [place] [address]    Report Status 09/10/2019 FINAL  Final  MRSA PCR Screening     Status: None   Collection Time: 09/09/19  6:02 PM   Specimen: Nasal Mucosa; Nasopharyngeal  Result Value  Ref Range Status   MRSA by PCR NEGATIVE NEGATIVE Final    Comment:        The GeneXpert MRSA Assay (FDA approved for NASAL specimens only), is one component of a comprehensive MRSA colonization surveillance program. It is not intended to diagnose MRSA infection nor to guide or monitor treatment for MRSA infections. Performed at [facility], [place] [address], [place], [place] [address]       Radiology Studies: No results found.    Scheduled Meds: . acyclovir  200 mg Oral BID  . atorvastatin  10 mg Oral q1800  . diltiazem  240 mg Oral Daily  . dronabinol  2.5 mg Oral QAC lunch  . fluticasone  2 spray Each Nare Daily  . metoprolol tartrate  37.5 mg Oral BID  . montelukast  10 mg Oral QHS  . pantoprazole  40 mg Oral BID AC  . sodium chloride flush  3 mL Intravenous Q12H  . umeclidinium-vilanterol  1 puff Inhalation Daily   Continuous Infusions: . sodium chloride    . sodium chloride    . sodium chloride       LOS: 8 days   Time spent: 35 minutes   Domenic Polite, MD [facility] 09/13/2019, 11:32 AM

## 2019-09-13 NOTE — Plan of Care (Signed)

## 2019-09-13 NOTE — Progress Notes (Signed)
Occupational Therapy Treatment Patient Details Name: Carol Bradley MRN: [id number] DOB: 15-Jan-1941 Today's Date: 09/13/2019    History of present illness Pt is a 79 yo female s/p 7 days of SOB, N/V. Found to be anemic, EF 55-60% and requiring O2. PMHx: CAD, myeloma, HTN, COPD, back sx, neck sx.  Developed hematemesis and hypotension on 09/09/19 and was transferred to ICU.  Underwent EGD 09/10/19.   OT comments  Pt seen for OT f/u with focus on BADL progression, ECS, and endurance for daily routines. Pt able to complete bed mobility at supervision level and sit <> stand with min guard assist. Pt completed functional mobility to bathroom with min guard and x1 instance of min A to correct LOB. Pt with tendency to rush movements, making for higher fall risk. Pt then stood at sink to engage in multi step grooming tasks. Pt on 2L [place] throughout session with VSS. Spoke with son about home DME needs. He states he plans to have stair lift and walk in showers installed. Updated d/c recs to [facility] with 24/7 supervision. Will continue to follow.   Follow Up Recommendations  Home health OT;Supervision/Assistance - 24 hour    Equipment Recommendations  None recommended by OT;Other (comment)(son plans to have stair lifter and walk in accessible showers installed)    Recommendations for Other Services      Precautions / Restrictions Precautions Precautions: Fall Precaution Comments: watch O2, watch HR Restrictions Weight Bearing Restrictions: No       Mobility Bed Mobility Overal bed mobility: Needs Assistance Bed Mobility: Supine to Sit;Sit to Supine     Supine to sit: HOB elevated;Supervision     General bed mobility comments: heavy use of rail  Transfers Overall transfer level: Needs assistance Equipment used: Rolling walker (2 wheeled) Transfers: Sit to/from Stand Sit to Stand: Min guard         General transfer comment: min guard for safety and steadying    Balance Overall balance  assessment: Needs assistance Sitting-balance support: Feet supported Sitting balance-Leahy Scale: Good     Standing balance support: During functional activity;Bilateral upper extremity supported Standing balance-Leahy Scale: Fair                             ADL either performed or assessed with clinical judgement   ADL       Grooming: Min guard;Standing;Wash/dry hands;Oral care Grooming Details (indicate cue type and reason): standing at sink to wash hands and complete oral care. Pt needing pacing cues to not hurry and maintain safety             Lower Body Dressing: Supervision/safety;Sit to/from stand Lower Body Dressing Details (indicate cue type and reason): donned sicks sitting EOB with figure 4 method Toilet Transfer: Min Dentist;Ambulation;Grab bars;Cueing for safety Toilet Transfer Details (indicate cue type and reason): pt completed functional mobility at min guard to toilet, x1 instance of min A to correct balance as pt hurrying to use toilet. Toileting- Water quality scientist and Hygiene: Min guard;Sit to/from stand       Functional mobility during ADLs: Min guard;Cueing for safety General ADL Comments: progressing to engage in multistep BADL     Vision Baseline Vision/History: Wears glasses Wears Glasses: At all times Patient Visual Report: No change from baseline     Perception     Praxis      Cognition Arousal/Alertness: Awake/alert Behavior During Therapy: WFL for tasks assessed/performed Overall Cognitive Status:  Within Functional Limits for tasks assessed                                          Exercises     Shoulder Instructions       General Comments      Pertinent Vitals/ Pain       Pain Assessment: No/denies pain  Home Living                                          Prior Functioning/Environment              Frequency  Min 2X/week         Progress Toward Goals  OT Goals(current goals can now be found in the care plan section)  Progress towards OT goals: Progressing toward goals  Acute Rehab OT Goals Patient Stated Goal: to go home OT Goal Formulation: With patient/family Time For Goal Achievement: 09/22/19 Potential to Achieve Goals: Good  Plan Discharge plan needs to be updated    Co-evaluation                 AM-PAC OT "6 Clicks" Daily Activity     Outcome Measure   Help from another person eating meals?: None Help from another person taking care of personal grooming?: None Help from another person toileting, which includes using toliet, bedpan, or urinal?: A Little Help from another person bathing (including washing, rinsing, drying)?: A Little Help from another person to put on and taking off regular upper body clothing?: None Help from another person to put on and taking off regular lower body clothing?: None 6 Click Score: 22    End of Session Equipment Utilized During Treatment: Gait belt;Rolling walker;Oxygen  OT Visit Diagnosis: Unsteadiness on feet (R26.81);Muscle weakness (generalized) (M62.81)   Activity Tolerance Patient tolerated treatment well   Patient Left in bed;with call bell/phone within reach;with family/visitor present   Nurse Communication Mobility status        Time: [id number] OT Time Calculation (min): 22 min  Charges: OT General Charges $OT Visit: 1 Visit OT Treatments $Self Care/Home Management : 8-22 mins  Zenovia Jarred, MSOT, OTR/L [facility] Office Number: [phone] Pager: [phone]  Zenovia Jarred 09/13/2019, 3:52 PM

## 2019-09-13 NOTE — Progress Notes (Signed)
PHYSICAL THERAPY PROGRESS REPORT  CLINICAL IMPRESSION: Pt did well with mobility this am, seemed to be slightly lethargic throughout but able to complete all tasks given. Completed bed mob, transfers with set up and SBA-min guard assist. Ambulated approx 16ft with RW and min guard assist, pt was on 2L/min via [place] and noted sats remained >92% throughout HR increased to max 100bpm with ambulation. Pt was reinforced on incentive spirometer use.  D/C RECOMMENDATION: Supervision 24 hours w/ Home Health.     09/13/19 0954  PT Visit Information  Last PT Received On 09/13/19  Assistance Needed +1  History of Present Illness Pt is a 79 yo female s/p 7 days of SOB, N/V. Found to be anemic, EF 55-60% and requiring O2. PMHx: CAD, myeloma, HTN, COPD, back sx, neck sx.  Developed hematemesis and hypotension on 09/09/19 and was transferred to ICU.  Underwent EGD 09/10/19.  Subjective Data  Patient Stated Goal to go home  Precautions  Precautions Fall  Precaution Comments watch O2, watch HR  Restrictions  Weight Bearing Restrictions No  Pain Assessment  Pain Assessment No/denies pain  Cognition  Arousal/Alertness Lethargic  Behavior During Therapy WFL for tasks assessed/performed  Overall Cognitive Status Within Functional Limits for tasks assessed  Bed Mobility  Overal bed mobility Needs Assistance  Bed Mobility Supine to Sit;Sit to Supine  Supine to sit HOB elevated;Supervision  Sit to supine Supervision  General bed mobility comments needs set up and line management, uses bed fixtures  Transfers  Overall transfer level Needs assistance  Equipment used Rolling walker (2 wheeled)  Transfers Stand Pivot Transfers;Sit to/from Stand  Sit to [employer] guard  Stand pivot transfers Min guard  Ambulation/Gait  Ambulation/Gait assistance Min guard  Gait Distance (Feet) 120 Feet  Assistive device Rolling walker (2 wheeled)  Gait Pattern/deviations Step-through pattern;Trunk flexed;Decreased stride  length  General Gait Details on 2L/min via [place] desat to min 92 with ambulation HR increased to 100bpm  Gait velocity decreased  Balance  Overall balance assessment Needs assistance  Sitting-balance support Feet supported  Sitting balance-Leahy Scale Good  Standing balance support During functional activity;Bilateral upper extremity supported  Standing balance-Leahy Scale Fair  Exercises  Exercises Other exercises  Other Exercises  Other Exercises incentive spirometer use  PT - End of Session  Equipment Utilized During Treatment Oxygen;Gait belt  Activity Tolerance Patient tolerated treatment well;Patient limited by fatigue  Patient left in bed;with call bell/phone within reach;with bed alarm set   PT - Assessment/Plan  PT Plan Current plan remains appropriate  PT Visit Diagnosis Muscle weakness (generalized) (M62.81)  PT Frequency (ACUTE ONLY) Min 3X/week  Follow Up Recommendations Supervision/Assistance - 24 hour;SNF;Home health PT  PT equipment None recommended by PT  AM-PAC PT "6 Clicks" Mobility Outcome Measure (Version 2)  Help needed turning from your back to your side while in a flat bed without using bedrails? 4  Help needed moving from lying on your back to sitting on the side of a flat bed without using bedrails? 3  Help needed moving to and from a bed to a chair (including a wheelchair)? 3  Help needed standing up from a chair using your arms (e.g., wheelchair or bedside chair)? 3  Help needed to walk in hospital room? 3  Help needed climbing 3-5 steps with a railing?  3  6 Click Score 19  Consider Recommendation of Discharge To: Home with [facility]  PT Goal Progression  Progress towards PT goals Progressing toward goals  Acute Rehab  PT Goals  PT Goal Formulation With patient/family  Time For Goal Achievement 09/22/19  Potential to Achieve Goals Good  PT Time Calculation  PT Start Time (ACUTE ONLY) 0932  PT Stop Time (ACUTE ONLY) 0955  PT Time Calculation (min) (ACUTE  ONLY) 23 min  PT General Charges  $$ ACUTE PT VISIT 1 Visit  PT Treatments  $Gait Training 8-22 mins  $Therapeutic Exercise 8-22 mins    Horald Chestnut, PT

## 2019-09-14 LAB — BASIC METABOLIC PANEL
Anion gap: 12 (ref 5–15)
BUN: 13 mg/dL (ref 8–23)
CO2: 25 mmol/L (ref 22–32)
Calcium: 8 mg/dL — ABNORMAL LOW (ref 8.9–10.3)
Chloride: 109 mmol/L (ref 98–111)
Creatinine, Ser: 1.22 mg/dL — ABNORMAL HIGH (ref 0.44–1.00)
GFR calc Af Amer: 49 mL/min — ABNORMAL LOW (ref >60)
GFR calc non Af Amer: 42 mL/min — ABNORMAL LOW (ref >60)
Glucose, Bld: 96 mg/dL (ref 70–99)
Potassium: 3.6 mmol/L (ref 3.5–5.1)
Sodium: 146 mmol/L — ABNORMAL HIGH (ref 135–145)

## 2019-09-14 LAB — CBC
HCT: 31.3 % — ABNORMAL LOW (ref 36.0–46.0)
Hemoglobin: 9.8 g/dL — ABNORMAL LOW (ref 12.0–15.0)
MCH: 30.9 pg (ref 26.0–34.0)
MCHC: 31.3 g/dL (ref 30.0–36.0)
MCV: 98.7 fL (ref 80.0–100.0)
Platelets: 261 10*3/uL (ref 150–400)
RBC: 3.17 MIL/uL — ABNORMAL LOW (ref 3.87–5.11)
RDW: 20.6 % — ABNORMAL HIGH (ref 11.5–15.5)
WBC: 4.9 10*3/uL (ref 4.0–10.5)
nRBC: 0 % (ref 0.0–0.2)

## 2019-09-14 MED ORDER — FUROSEMIDE 20 MG PO TABS
20.0000 mg | ORAL_TABLET | Freq: Every day | ORAL | Status: DC
  Administered 2019-09-14: 15:00:00 20 mg via ORAL
  Filled 2019-09-14: qty 1

## 2019-09-14 MED ORDER — POTASSIUM CHLORIDE CRYS ER 20 MEQ PO TBCR
20.0000 meq | EXTENDED_RELEASE_TABLET | Freq: Once | ORAL | Status: AC
  Administered 2019-09-14: 15:00:00 20 meq via ORAL
  Filled 2019-09-14: qty 1

## 2019-09-14 NOTE — Progress Notes (Signed)
Tried to wean patient on RA. Saturation low, patient placed back on [place] 1 l/min

## 2019-09-14 NOTE — Plan of Care (Signed)

## 2019-09-14 NOTE — Progress Notes (Signed)
PROGRESS NOTE    Carol Bradley  MAU:[id number] DOB: [birth year] DOA: 09/05/2019 PCP: [name], MD     Brief Narrative:  Carol Bradley is a 79 y.o. 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  Patient presented with 7-day history of worsening weakness with shortness of breath, nausea, vomiting.  She was seen and evaluated at the cancer center, found to be hypoxic to 56% on room air and was found to be quite anemic with hemoglobin of 7.8. -She was admitted and transfused, subsequently had acute hematemesis with hypotension, was transferred to the ICU, transfused PRBC, GI consulted, underwent endoscopy which noted gastric ulcer and 2 AVMs -transferred to [facility] 3/7  New events last 24 hours / Subjective: -feels ok, no events overnight, breathing improving  Assessment & Plan:   Hemorrhagic shock Acute Blood loss anemia -Secondary to gastric ulcer, AVMs,  -Gastroenterology consulted, underwent endoscopy 3/3 which showed 1 nonbleeding gastric ulcer and 2 small AVMs -No further active bleeding, transitioned to oral PPI  -Also has chronic anemia at baseline secondary to multiple myeloma -Hemoglobin stable and improving -PT OT eval completed, HH recommended -DC planning  Acute hypoxemic respiratory failure Acute on chronic diastolic heart failure -Echocardiogram 2/26: EF 55 to 60%, with grade 1 diastolic dysfunction -Likely exacerbated by anemia -Diuresed with IV Lasix earlier this admission -IV. Lasix x1 yesterday, change to po lasix '20mg'$  daily -Wean off O2, now on 2 L  ?CAP -Procalcitonin 0.35 -Influenza and COVID-19 negative. Patient due for her second COVID vaccine on 3/6 (Moderna) -Chest x-ray 2/26: Bilateral diffuse interstitial thickening which may reflect interstitial edema versus multilobar pneumonia -Treated with IV ceftriaxone and  azithromycin with this admission, completed antibiotics, doubt pneumonia, suspect this was secondary to fluid  AKI on CKD stage 3a -Baseline creatinine around 1.3-1.4 -Improved  Elevated troponin -Due to anemia, CHF -Echocardiogram did not reveal regional wall motion abnormalities  Multiple myeloma -Followed by Dr. Delton Coombes  CAD status post PCI/DES in 2012 -Continue aspirin, Lipitor, Lopressor   Essential hypertension -Continue Cardizem, Lopressor  DVT prophylaxis: Lovenox Code Status: Full  Family Communication: No family at bedside . Disposition Plan: home improvement in respiratory status, hypoxemia, likely 3/8   Consultants:   None  Procedures:   None   Antimicrobials:  Anti-infectives (From admission, onward)   Start     Dose/Rate Route Frequency Ordered Stop   09/06/19 1030  azithromycin (ZITHROMAX) 500 mg in sodium chloride 0.9 % 250 mL IVPB     500 mg 250 mL/hr over 60 Minutes Intravenous Every 24 hours 09/05/19 2140 09/10/19 1206   09/06/19 1000  cefTRIAXone (ROCEPHIN) 2 g in sodium chloride 0.9 % 100 mL IVPB     2 g 200 mL/hr over 30 Minutes Intravenous Every 24 hours 09/05/19 2140 09/10/19 1038   09/05/19 2200  acyclovir (ZOVIRAX) 200 MG capsule 200 mg     200 mg Oral 2 times daily 09/05/19 2140     09/05/19 1515  cefTRIAXone (ROCEPHIN) 1 g in sodium chloride 0.9 % 100 mL IVPB     1 g 200 mL/hr over 30 Minutes Intravenous  Once 09/05/19 1458 09/05/19 1608   09/05/19 1500  azithromycin (ZITHROMAX) tablet 500 mg     500 mg Oral  Once 09/05/19 1458 09/05/19 1538       Objective: Vitals:   09/14/19 5638 09/14/19 9373 09/14/19 0849 09/14/19 1204  BP: 120/64 (!) 104/41    Pulse: 92 89    Resp: 18 20    Temp: 98.2 F (36.8 C)     TempSrc: Oral     SpO2: 94% 97% (!) 86% 93%  Weight: 75.5 kg     Height:        Intake/Output Summary (Last 24 hours) at 09/14/2019 1333 Last data filed at 09/14/2019 0816 Gross per 24 hour  Intake 600 ml  Output 1051  ml  Net -451 ml   Filed Weights   09/12/19 0119 09/13/19 0535 09/14/19 0517  Weight: 77 kg 76.5 kg 75.5 kg    Examination: Gen: Awake, Alert, Oriented X 3, no distress HEENT: PERRLA, Neck supple, no JVD Lungs: decreased BS at bases CVS: RRR,No Gallops,Rubs or new Murmurs Abd: soft, Non tender, non distended, BS present Extremities: No edema Skin: no new rashes on exposed skin  Psychiatry: Judgement and insight appear stable. Mood & affect appropriate.   Data Reviewed: I have personally reviewed following labs and imaging studies  CBC: Recent Labs  Lab 09/10/19 1543 09/11/19 0502 09/12/19 0342 09/13/19 0509 09/14/19 0349  WBC 6.4 5.8 6.1 6.2 4.9  HGB 7.7* 8.7* 7.5* 9.3* 9.8*  HCT 25.4* 28.4* 24.7* 29.9* 31.3*  MCV 99.2 98.6 100.0 98.7 98.7  PLT 327 282 265 268 196   Basic Metabolic Panel: Recent Labs  Lab 09/09/19 1811 09/10/19 0713 09/11/19 0502 09/13/19 0815 09/14/19 0349  NA 144 146* 147* 147* 146*  K 5.0 4.3 4.0 4.1 3.6  CL 114* 114* 117* 111 109  CO2 '22 24 24 27 25  '$ GLUCOSE 130* 92 89 102* 96  BUN 26* 40* 26* 16 13  CREATININE 1.34* 1.20* 1.09* 1.16* 1.22*  CALCIUM 7.1* 7.5* 7.9* 8.3* 8.0*  MG  --  1.9 1.9  --   --   PHOS  --   --  3.2  --   --    GFR: Estimated Creatinine Clearance: 40.3 mL/min (A) (by C-G formula based on SCr of 1.22 mg/dL (H)). Liver Function Tests: No results for input(s): AST, ALT, ALKPHOS, BILITOT, PROT, ALBUMIN in the last 168 hours. No results for input(s): LIPASE, AMYLASE in the last 168 hours. No results for input(s): AMMONIA in the last 168 hours. Coagulation Profile: No results for input(s): INR, PROTIME in the last 168 hours. Cardiac Enzymes: No results for input(s): CKTOTAL, CKMB, CKMBINDEX, TROPONINI in the last 168 hours. BNP (last 3 results) No results for input(s): PROBNP in the last 8760 hours. HbA1C: No results for input(s): HGBA1C in the last 72 hours. CBG: Recent Labs  Lab 09/09/19 1551  GLUCAP 125*    Lipid Profile: No results for input(s): CHOL, HDL, LDLCALC, TRIG, CHOLHDL, LDLDIRECT in the last 72 hours. Thyroid Function Tests: No results for input(s): TSH, T4TOTAL, FREET4, T3FREE, THYROIDAB in the last 72 hours. Anemia Panel: No results for input(s): VITAMINB12, FOLATE, FERRITIN, TIBC, IRON, RETICCTPCT in the last 72 hours. Sepsis Labs: No results for input(s): PROCALCITON, LATICACIDVEN in the last 168 hours.  Recent Results (from the past 240 hour(s))  Respiratory Panel by RT PCR (Flu A&B, Covid) - Nasopharyngeal Swab     Status: None   Collection Time: 09/05/19  2:57 PM   Specimen: Nasopharyngeal Swab  Result Value Ref Range Status   SARS Coronavirus 2 by RT PCR NEGATIVE NEGATIVE Final    Comment: (NOTE) SARS-CoV-2 target nucleic acids are NOT DETECTED. The SARS-CoV-2 RNA is generally detectable in upper respiratoy specimens during the  acute phase of infection. The lowest concentration of SARS-CoV-2 viral copies this assay can detect is 131 copies/mL. A negative result does not preclude SARS-Cov-2 infection and should not be used as the sole basis for treatment or other patient management decisions. A negative result may occur with  improper specimen collection/handling, submission of specimen other than nasopharyngeal swab, presence of viral mutation(s) within the areas targeted by this assay, and inadequate number of viral copies (<131 copies/mL). A negative result must be combined with clinical observations, patient history, and epidemiological information. The expected result is Negative. Fact Sheet for Patients:  PinkCheek.be Fact Sheet for Healthcare Providers:  GravelBags.it This test is not yet ap proved or cleared by the [place] FDA and  has been authorized for detection and/or diagnosis of SARS-CoV-2 by FDA under an Emergency Use Authorization (EUA). This EUA will remain  in effect (meaning this  test can be used) for the duration of the COVID-19 declaration under Section 564(b)(1) of the Act, 21 U.S.C. section 360bbb-3(b)(1), unless the authorization is terminated or revoked sooner.    Influenza A by PCR NEGATIVE NEGATIVE Final   Influenza B by PCR NEGATIVE NEGATIVE Final    Comment: (NOTE) The Xpert Xpress SARS-CoV-2/FLU/RSV assay is intended as an aid in  the diagnosis of influenza from Nasopharyngeal swab specimens and  should not be used as a sole basis for treatment. Nasal washings and  aspirates are unacceptable for Xpert Xpress SARS-CoV-2/FLU/RSV  testing. Fact Sheet for Patients: PinkCheek.be Fact Sheet for Healthcare Providers: GravelBags.it This test is not yet approved or cleared by the [place] FDA and  has been authorized for detection and/or diagnosis of SARS-CoV-2 by  FDA under an Emergency Use Authorization (EUA). This EUA will remain  in effect (meaning this test can be used) for the duration of the  Covid-19 declaration under Section 564(b)(1) of the Act, 21  U.S.C. section 360bbb-3(b)(1), unless the authorization is  terminated or revoked. Performed at [facility], [address] [place], [place] [address]   Blood culture (routine x 2)     Status: None   Collection Time: 09/05/19  4:38 PM   Specimen: BLOOD RIGHT HAND  Result Value Ref Range Status   Specimen Description BLOOD RIGHT HAND  Final   Special Requests   Final    BOTTLES DRAWN AEROBIC AND ANAEROBIC Blood Culture adequate volume   Culture   Final    NO GROWTH 5 DAYS Performed at [facility], [address] [place], [place] [address]    Report Status 09/10/2019 FINAL  Final  Blood culture (routine x 2)     Status: None   Collection Time: 09/05/19  4:39 PM   Specimen: Left Antecubital; Blood  Result Value Ref Range Status   Specimen Description LEFT ANTECUBITAL  Final   Special Requests   Final    BOTTLES DRAWN AEROBIC AND  ANAEROBIC Blood Culture adequate volume   Culture   Final    NO GROWTH 5 DAYS Performed at [facility], [address] [place], [place] [address]    Report Status 09/10/2019 FINAL  Final  MRSA PCR Screening     Status: None   Collection Time: 09/09/19  6:02 PM   Specimen: Nasal Mucosa; Nasopharyngeal  Result Value Ref Range Status   MRSA by PCR NEGATIVE NEGATIVE Final    Comment:        The GeneXpert MRSA Assay (FDA approved for NASAL specimens only), is one component of a comprehensive MRSA colonization  surveillance program. It is not intended to diagnose MRSA infection nor to guide or monitor treatment for MRSA infections. Performed at [facility], [place] [address], [place], [place] [address]       Radiology Studies: No results found.    Scheduled Meds: . acyclovir  200 mg Oral BID  . atorvastatin  10 mg Oral q1800  . diltiazem  240 mg Oral Daily  . dronabinol  2.5 mg Oral QAC lunch  . fluticasone  2 spray Each Nare Daily  . metoprolol tartrate  37.5 mg Oral BID  . montelukast  10 mg Oral QHS  . pantoprazole  40 mg Oral BID AC  . sodium chloride flush  3 mL Intravenous Q12H  . umeclidinium-vilanterol  1 puff Inhalation Daily   Continuous Infusions: . sodium chloride    . sodium chloride    . sodium chloride       LOS: 9 days   Time spent: 25 minutes   Domenic Polite, MD [facility] 09/14/2019, 1:33 PM

## 2019-09-15 ENCOUNTER — Inpatient Hospital Stay (HOSPITAL_COMMUNITY): Payer: Medicare Other

## 2019-09-15 ENCOUNTER — Other Ambulatory Visit (HOSPITAL_COMMUNITY): Payer: Self-pay | Admitting: *Deleted

## 2019-09-15 ENCOUNTER — Ambulatory Visit (HOSPITAL_COMMUNITY): Payer: Medicare Other

## 2019-09-15 ENCOUNTER — Ambulatory Visit (HOSPITAL_COMMUNITY): Payer: Medicare Other | Admitting: Hematology

## 2019-09-15 DIAGNOSIS — C9000 Multiple myeloma not having achieved remission: Secondary | ICD-10-CM

## 2019-09-15 LAB — CBC
HCT: 31.9 % — ABNORMAL LOW (ref 36.0–46.0)
Hemoglobin: 9.9 g/dL — ABNORMAL LOW (ref 12.0–15.0)
MCH: 30.9 pg (ref 26.0–34.0)
MCHC: 31 g/dL (ref 30.0–36.0)
MCV: 99.7 fL (ref 80.0–100.0)
Platelets: 287 10*3/uL (ref 150–400)
RBC: 3.2 MIL/uL — ABNORMAL LOW (ref 3.87–5.11)
RDW: 20.1 % — ABNORMAL HIGH (ref 11.5–15.5)
WBC: 4.9 10*3/uL (ref 4.0–10.5)
nRBC: 0 % (ref 0.0–0.2)

## 2019-09-15 LAB — BASIC METABOLIC PANEL
Anion gap: 8 (ref 5–15)
BUN: 12 mg/dL (ref 8–23)
CO2: 28 mmol/L (ref 22–32)
Calcium: 8.1 mg/dL — ABNORMAL LOW (ref 8.9–10.3)
Chloride: 109 mmol/L (ref 98–111)
Creatinine, Ser: 1.34 mg/dL — ABNORMAL HIGH (ref 0.44–1.00)
GFR calc Af Amer: 44 mL/min — ABNORMAL LOW (ref >60)
GFR calc non Af Amer: 38 mL/min — ABNORMAL LOW (ref >60)
Glucose, Bld: 104 mg/dL — ABNORMAL HIGH (ref 70–99)
Potassium: 3.9 mmol/L (ref 3.5–5.1)
Sodium: 145 mmol/L (ref 135–145)

## 2019-09-15 MED ORDER — DRONABINOL 5 MG PO CAPS: 5.0000 mg | ORAL_CAPSULE | Freq: Two times a day (BID) | ORAL | 1 refills | Status: DC

## 2019-09-15 MED ORDER — POMALIDOMIDE 2 MG PO CAPS: ORAL_CAPSULE | ORAL | 0 refills | Status: DC

## 2019-09-15 MED ORDER — PANTOPRAZOLE SODIUM 40 MG PO TBEC: 40.0000 mg | DELAYED_RELEASE_TABLET | Freq: Two times a day (BID) | ORAL | 0 refills | Status: AC

## 2019-09-15 NOTE — TOC Initial Note (Signed)
Transition of Care [facility]) - Initial/Assessment Note    Patient Details  Name: Carol Bradley MRN: [id number] Date of Birth: 19-May-1941  Transition of Care [facility]) CM/SW Contact:    Alberteen Sam, LCSW Phone Number: 09/15/2019, 9:29 AM  Clinical Narrative:                  CSW spoke with patient regarding home health PT and OT recommendation, she reports she is agreeable to home health PT, OT and aide with no preference of agency. She reports no DME needs, and states she has her own home oxygen concentrator and no oxygen needs, states someone in her family will be picking her up today at discharge.   CSW sent referrals for home health agencies for PT, OT and aide.  Expected Discharge Plan: [place] Barriers to Discharge: No Barriers Identified   Patient Goals and CMS Choice Patient states their goals for this hospitalization and ongoing recovery are:: to go home CMS Medicare.gov Compare Post Acute Care list provided to:: Patient Choice offered to / list presented to : Patient  Expected Discharge Plan and Services Expected Discharge Plan: [place] In-house Referral: NA Discharge Planning Services: CM Consult   Living arrangements for the past 2 months: Single Family Home Expected Discharge Date: 09/15/19                         HH Arranged: PT, OT, Nurse's Aide HH Agency: [place] Date [facility] Agency Contacted: 09/15/19 Time HH Agency Contacted: [phone] Representative spoke with at [place]: Tommi Rumps  Prior Living Arrangements/Services Living arrangements for the past 2 months: [place] with:: Self Patient language and need for interpreter reviewed:: Yes Do you feel safe going back to the place where you live?: Yes      Need for Family Participation in Patient Care: Yes (Comment) Care giver support system in place?: Yes (comment) Current home services: DME(has cane and rolling walker) Criminal Activity/Legal  Involvement Pertinent to Current Situation/Hospitalization: No - Comment as needed  Activities of Daily Living Home Assistive Devices/Equipment: None ADL Screening (condition at time of admission) Patient's cognitive ability adequate to safely complete daily activities?: Yes Is the patient deaf or have difficulty hearing?: No Does the patient have difficulty seeing, even when wearing glasses/contacts?: No Does the patient have difficulty concentrating, remembering, or making decisions?: No Patient able to express need for assistance with ADLs?: Yes Does the patient have difficulty dressing or bathing?: No Independently performs ADLs?: Yes (appropriate for developmental age) Does the patient have difficulty walking or climbing stairs?: Yes Weakness of Legs: None Weakness of Arms/Hands: None  Permission Sought/Granted Permission sought to share information with : Case Freight forwarder, Investment banker, corporate granted to share info w AGENCY: Home Health        Emotional Assessment Appearance:: Appears stated age Attitude/Demeanor/Rapport: Gracious Affect (typically observed): Calm Orientation: : Oriented to Self, Oriented to Place, Oriented to  Time, Oriented to Situation Alcohol / Substance Use: Not Applicable Psych Involvement: No (comment)  Admission diagnosis:  Weakness [R53.1] Hypoxia [R09.02] Symptomatic anemia [D64.9] Anemia, unspecified type [D64.9] Acute on chronic congestive heart failure, unspecified heart failure type ([place]) [I50.9] Patient Active Problem List   Diagnosis Date Noted  . Hemorrhagic shock ([place]) 09/11/2019  . Acute respiratory failure ([place])   . Gastrointestinal hemorrhage   . Hypotension   . Symptomatic anemia  09/05/2019  . Elevated troponin 09/05/2019  . Community acquired pneumonia 09/05/2019  . Iron deficiency anemia 07/10/2017  . Vitamin B12 deficiency 04/15/2016  . Goals of care, counseling/discussion 04/15/2016  . Anemia  associated with stage 3 chronic renal failure 04/13/2016  . 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 Emphysema/COPD ([place]) 01/15/2016  . Chronic diastolic congestive heart failure ([place]) 12/13/2015  . Hypokalemia 11/02/2015  . History of heart disease 11/02/2015  . 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 Incontinence of bowel 08/24/2015  . 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 Severe low back pain 05/10/2015  . Multiple myeloma ([place]) 04/12/2015  . Anemia due to bone marrow failure ([place]) 04/02/2015  . Cramps, extremity 04/01/2015  . Encounter for chemotherapy management 04/01/2015  . Hematoma of hip, right, sequela 04/01/2015  . Chronic renal disease, stage 3, moderately decreased glomerular filtration rate between 30-59 mL/min/1.73 square meter 02/12/2015  . Elevated serum protein level 02/12/2015  . Hypoalbuminemia 02/12/2015  . Dyspnea on exertion 04/29/2014  . Dyspepsia 05/19/2011  . CAD (coronary artery disease)   . Hypertension   . Hyperlipidemia    PCP:  [name], MD Pharmacy:   [facility], [place] [address] Phone: [phone] Fax: [place], [place] - [address] [place] [address] Phone: [phone] Fax: [phone]     Social Determinants of Health (SDOH) Interventions    Readmission Risk Interventions No flowsheet data found.

## 2019-09-15 NOTE — TOC Transition Note (Signed)
Transition of Care [facility]) - CM/SW Discharge Note   Patient Details  Name: Carol Bradley MRN: [id number] Date of Birth: [birth year]  Transition of Care [facility]) CM/SW Contact:  Alberteen Sam, LCSW Phone Number: 09/15/2019, 9:31 AM   Clinical Narrative:     CSW spoke with [facility] with [facility] they are able to accept patient for Home Health PT, OT and aide. No DME needs.   Patient will be picked up by family, communicated with RN to ensure family brings home oxygen for transport.    No further discharge needs identified at this time.   Final next level of care: [place] Barriers to Discharge: No Barriers Identified   Patient Goals and CMS Choice Patient states their goals for this hospitalization and ongoing recovery are:: to go home CMS Medicare.gov Compare Post Acute Care list provided to:: Patient Choice offered to / list presented to : Patient  Discharge Placement                       Discharge Plan and Services In-house Referral: NA Discharge Planning Services: CM Consult                      HH Arranged: PT, OT, Nurse's Aide HH Agency: [place] Date [facility] Agency Contacted: 09/15/19 Time [place]: [phone] Representative spoke with at [place]: [place]) Interventions     Readmission Risk Interventions No flowsheet data found.

## 2019-09-15 NOTE — Progress Notes (Signed)
SATURATION QUALIFICATIONS: (This note is used to comply with regulatory documentation for home oxygen)  Patient Saturations on Room Air at Rest = 94%  Patient Saturations on Room Air while Ambulating = 92%   Pt does not need home oxygen

## 2019-09-17 ENCOUNTER — Inpatient Hospital Stay (HOSPITAL_COMMUNITY): Payer: Medicare Other

## 2019-09-17 ENCOUNTER — Ambulatory Visit (HOSPITAL_COMMUNITY): Payer: Medicare Other | Admitting: Hematology

## 2019-09-18 ENCOUNTER — Other Ambulatory Visit: Payer: Self-pay | Admitting: *Deleted

## 2019-09-19 ENCOUNTER — Encounter: Payer: Self-pay | Admitting: *Deleted

## 2019-09-19 ENCOUNTER — Other Ambulatory Visit (HOSPITAL_COMMUNITY): Payer: Medicare Other

## 2019-09-19 NOTE — Patient Outreach (Signed)
[facility]) Care Management  09/19/2019  K[place] Dedmon Zurita [birth year] [id number]   Late entry for 1750 09/18/19   [facility] outreach for EMMI general referral   EMMI-general discharge- Discharged from [place] with [facility] home health PT,OT, aide ON APL RED ON [place] Day # 1 Date: Wednesday 09/18/19 [place] Reason: Know who to call about changes in condition? No   Insurance: NextGen Medicare  [facility] admissions x 1 ED visits x 1 in the last 6 months    Outreach attempt # 1 successful to the home number  Her son, [name],  who is on the [employer] (DPR) indicating medical staff have permission to speak with him answers  Darrell denies Mrs Karraker is having any memory concerns Darrell reports Mrs Hanif is with home health [facility]) Physical therapy (PT) at the time of this call  [facility] RN CM discussed the importance of HIPAA verification and protecting Mrs Lofton's privacy   He is able to verify HIPAA, DOB and address  [facility] Care Management RN reviewed and addressed red alert with patient   EMMI:  [name] reports he answered the [facility] outreach call after reviewing the hospital discharge instruction  He reports Mrs Mahieu was not able to answer her telephone calls in the last few days He reports the EMMI red alert has been resolved as her spoke with Mrs Engebretsen's oncologist about replacing an oncology medication, antiviral that had been discontinued upon hospital discharge     [facility] RN CM encouraged Darrell to update Mrs Calogero on the outreach reason and follow outreach  He reports he will update patient on the outreach and future outreach     Darrell reports Mrs Crislip has had both covid shots already Transition of care assessment completed   Social:  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 She previously lived alone. She is needing assistance with all care needs and  transportation at this time.   Conditions: Chronic congestive Heart Failure (CHF), Hypertension (HTN), former smoker   DME: eyeglasses    Appointments: 09/24/19 hematology 10/01/19 oncology [place] 10/16/19 cardiology Dr Roby Lofts 10/30/19 Optometry Dr Heloise Beecham   Advance Directives: her son [name] is her [facility])   Consent: [facility] RN CM reviewed [facility] services with patient. Patient gave verbal consent for services [facility] telephonic RN CM.   Advised patient that there will be further automated EMMI- post discharge calls to assess how the patient is doing following the recent hospitalization Advised the patient that another call may be received from a nurse if any of their responses were abnormal. Patient voiced understanding and was appreciative of f/u call.   Plan: [facility] RN CM will follow up with Mrs Yehle within the next 28-35 business days as agreed upon for further follow up, assessment of care coordination and disease management needs  Pt encouraged to return a call to [facility] RN CM prn  [facility] RN CM sent a Gaffer with [facility] brochure, Magnet, [facility] consent form with return envelope and know before you go sheet enclosed for review  Routed note to MDs/NP/PA  [facility] MD involvement barriers letter sent   [facility] CM Care Plan Problem One     Most Recent Value  Care Plan Problem One  knowledge deficit for major medical conditions CHF, HTn  Role Documenting the Problem One  Care  Management Telephonic Coordinator  Care Plan for Problem One  Active  [facility] Long Term Goal   Over the next 90 days, patient will not be hospitalized for complications related to chronic illnesses  [facility] Long Term Goal Start Date  09/19/19  Interventions for Problem One Long Term Goal  EMMI assessment completed, Transition of care assessment completed  [facility] CM Short Term Goal #1   over the next 31 days patient will be able to verbalize with outreach interventions to manage CHF at home   [facility] CM Short Term Goal #1 Start Date  09/19/19  Interventions for Short Term Goal #1  answered question, assessed for care coordination and CHF disease management needs transition of care assessment       Duchess Armendarez [name], RN, BSN, [place] Coordinator Office number [phone] Mobile number [phone]  Main [facility] number [phone] Fax number [phone]

## 2019-09-23 ENCOUNTER — Inpatient Hospital Stay (HOSPITAL_COMMUNITY): Payer: Medicare Other | Attending: Hematology

## 2019-09-23 ENCOUNTER — Other Ambulatory Visit: Payer: Self-pay

## 2019-09-23 DIAGNOSIS — R531 Weakness: Secondary | ICD-10-CM | POA: Insufficient documentation

## 2019-09-23 DIAGNOSIS — Z9221 Personal history of antineoplastic chemotherapy: Secondary | ICD-10-CM | POA: Diagnosis not present

## 2019-09-23 DIAGNOSIS — M549 Dorsalgia, unspecified: Secondary | ICD-10-CM | POA: Diagnosis not present

## 2019-09-23 DIAGNOSIS — N183 Chronic kidney disease, stage 3 unspecified: Secondary | ICD-10-CM | POA: Diagnosis not present

## 2019-09-23 DIAGNOSIS — E785 Hyperlipidemia, unspecified: Secondary | ICD-10-CM | POA: Diagnosis not present

## 2019-09-23 DIAGNOSIS — D649 Anemia, unspecified: Secondary | ICD-10-CM | POA: Diagnosis not present

## 2019-09-23 DIAGNOSIS — C9000 Multiple myeloma not having achieved remission: Secondary | ICD-10-CM | POA: Insufficient documentation

## 2019-09-23 DIAGNOSIS — Z7982 Long term (current) use of aspirin: Secondary | ICD-10-CM | POA: Diagnosis not present

## 2019-09-23 DIAGNOSIS — J449 Chronic obstructive pulmonary disease, unspecified: Secondary | ICD-10-CM | POA: Insufficient documentation

## 2019-09-23 DIAGNOSIS — I2510 Atherosclerotic heart disease of native coronary artery without angina pectoris: Secondary | ICD-10-CM | POA: Diagnosis not present

## 2019-09-23 DIAGNOSIS — Z87891 Personal history of nicotine dependence: Secondary | ICD-10-CM | POA: Diagnosis not present

## 2019-09-23 DIAGNOSIS — Z79899 Other long term (current) drug therapy: Secondary | ICD-10-CM | POA: Diagnosis not present

## 2019-09-23 DIAGNOSIS — I129 Hypertensive chronic kidney disease with stage 1 through stage 4 chronic kidney disease, or unspecified chronic kidney disease: Secondary | ICD-10-CM | POA: Insufficient documentation

## 2019-09-23 LAB — CBC WITH DIFFERENTIAL/PLATELET
Abs Immature Granulocytes: 0.02 10*3/uL (ref 0.00–0.07)
Basophils Absolute: 0.1 10*3/uL (ref 0.0–0.1)
Basophils Relative: 1 %
Eosinophils Absolute: 0.1 10*3/uL (ref 0.0–0.5)
Eosinophils Relative: 1 %
HCT: 37.7 % (ref 36.0–46.0)
Hemoglobin: 11.4 g/dL — ABNORMAL LOW (ref 12.0–15.0)
Immature Granulocytes: 0 %
Lymphocytes Relative: 13 %
Lymphs Abs: 0.7 10*3/uL (ref 0.7–4.0)
MCH: 31.3 pg (ref 26.0–34.0)
MCHC: 30.2 g/dL (ref 30.0–36.0)
MCV: 103.6 fL — ABNORMAL HIGH (ref 80.0–100.0)
Monocytes Absolute: 0.9 10*3/uL (ref 0.1–1.0)
Monocytes Relative: 16 %
Neutro Abs: 4.1 10*3/uL (ref 1.7–7.7)
Neutrophils Relative %: 69 %
Platelets: 283 10*3/uL (ref 150–400)
RBC: 3.64 MIL/uL — ABNORMAL LOW (ref 3.87–5.11)
RDW: 18.9 % — ABNORMAL HIGH (ref 11.5–15.5)
WBC: 5.9 10*3/uL (ref 4.0–10.5)
nRBC: 0 % (ref 0.0–0.2)

## 2019-09-23 LAB — COMPREHENSIVE METABOLIC PANEL
ALT: 15 U/L (ref 0–44)
AST: 24 U/L (ref 15–41)
Albumin: 3.2 g/dL — ABNORMAL LOW (ref 3.5–5.0)
Alkaline Phosphatase: 74 U/L (ref 38–126)
Anion gap: 9 (ref 5–15)
BUN: 18 mg/dL (ref 8–23)
CO2: 24 mmol/L (ref 22–32)
Calcium: 8.2 mg/dL — ABNORMAL LOW (ref 8.9–10.3)
Chloride: 110 mmol/L (ref 98–111)
Creatinine, Ser: 1.29 mg/dL — ABNORMAL HIGH (ref 0.44–1.00)
GFR calc Af Amer: 46 mL/min — ABNORMAL LOW (ref >60)
GFR calc non Af Amer: 40 mL/min — ABNORMAL LOW (ref >60)
Glucose, Bld: 104 mg/dL — ABNORMAL HIGH (ref 70–99)
Potassium: 3.8 mmol/L (ref 3.5–5.1)
Sodium: 143 mmol/L (ref 135–145)
Total Bilirubin: 0.5 mg/dL (ref 0.3–1.2)
Total Protein: 5.9 g/dL — ABNORMAL LOW (ref 6.5–8.1)

## 2019-09-24 ENCOUNTER — Telehealth (INDEPENDENT_AMBULATORY_CARE_PROVIDER_SITE_OTHER): Payer: Self-pay | Admitting: Internal Medicine

## 2019-09-24 NOTE — Telephone Encounter (Signed)
Daughter called stated mother had been in the hospital and had an EGD at [facility] - discharge papers say she has an iron deficiency and is anemic -  Needs GI f/u - please advise -

## 2019-09-25 ENCOUNTER — Telehealth: Payer: Self-pay | Admitting: Cardiology

## 2019-09-25 NOTE — Telephone Encounter (Signed)
Pt c/o medication issue:  1. Name of Medication: furosemide (LASIX) 20 MG tablet  2. How are you currently taking this medication (dosage and times per day)?   3. Are you having a reaction (difficulty breathing--STAT)?  4. What is your medication issue? Patient wants to know if she needs to take this daily, or as needed for ankle swelling, Please advise.

## 2019-09-25 NOTE — Telephone Encounter (Signed)
Spoke to patient she stated she has been in [place].She was discharged this past Tuesday. Stated he had several things wrong bleeding ulcer,low hgb.Stated she was told to stop taking furosemide.She wanted to ask Dr.Jordan if he agreed.Advised Dr.Jordan is out of office this week.I will send message to him.

## 2019-09-26 NOTE — Telephone Encounter (Signed)
Advised patient, verbalized understanding  

## 2019-09-26 NOTE — Telephone Encounter (Signed)
Interesting. I reviewed her chart. She was admitted with a GI bleed and transfused. Last progress note on 3/7 said they were going to transition  20 mg daily. There is no DC summary so not sure what she was sent home on. Seems like she should continue 20 mg daily.  Ahmani Prehn Martinique MD, [facility]

## 2019-09-28 NOTE — Discharge Summary (Signed)
Physician Discharge Summary  Carol Bradley WCB:[id number] DOB: 02-Nov-1940 DOA: 09/05/2019  PCP: [name], MD  Admit date: 09/05/2019 Discharge date: 09/15/2019  Time spent: 35 minutes  Recommendations for Outpatient Follow-up:  PCP in 1 week, please check CBC at FU  Discharge Diagnoses:  Principal Problem:   Symptomatic anemia Active Problems:   CAD (coronary artery disease)   Hypertension   Multiple myeloma ([place])   Emphysema/COPD ([place])   Anemia due to bone marrow failure (HCC)   Chronic diastolic congestive heart failure (HCC)   Chronic renal disease, stage 3, moderately decreased glomerular filtration rate between 30-59 mL/min/1.73 square meter   Elevated troponin   Community acquired pneumonia   Acute respiratory failure ([place])   Gastrointestinal hemorrhage   Hypotension   Hemorrhagic shock ([place])   Discharge Condition: stable  Diet recommendation:heart healthy, low sodium  Filed Weights   09/13/19 0535 09/14/19 0517 09/15/19 0519  Weight: 76.5 kg 75.5 kg 72.9 kg    History of present illness:  Carol Bradley is a 79 y.o.femalewith a history of coronary artery disease, stage III chronic kidney disease, hypertension, multiple myeloma with anemia secondary to bone marrow failure, hypertension, COPD. Patient presented with 7-day history of worsening weakness with shortness of breath, nausea, vomiting. She was seen and evaluated at the cancer center, found to be hypoxic to 56% on room air and was found to be quite anemic with hemoglobin of 7.8. -She was admitted and transfused, subsequently had acute hematemesis with hypotension, was transferred to the ICU, transfused PRBC, GI consulted, underwent endoscopy which noted gastric ulcer and 2 AVMs -transferred to [facility] service on 3/7  Hospital Course:   Hemorrhagic shock Acute Blood loss anemia -admitted with hemorrhagic shock secondary to gastric ulcer, AVMs,  -Gastroenterology consulted, underwent endoscopy 3/3 which  showed 1 nonbleeding gastric ulcer and 2 small AVMs -No further active bleeding, transitioned to oral PPI  -Also has chronic anemia at baseline secondary to multiple myeloma -Hemoglobin stable and improving -PT OT eval completed, HH recommended -fu with PCP in 1 week and recheck CBC  Acute hypoxemic respiratory failure Acute on chronic diastolic heart failure -Echocardiogram 2/26: EF 55 to 60%, with grade 1 diastolic dysfunction -Likely exacerbated by anemia -Diuresed with IV Lasix earlier this admission -changed to po lasix '20mg'$  daily -Weaned off O2  ?COmmunity acquired pneumonia -Procalcitonin 0.35 -Influenza and COVID-19 negative. Patient due for her second COVID vaccine on 3/6 (Moderna) -Chest x-ray on admission, 2/26: Bilateral diffuse interstitial thickening which may reflect interstitial edema versus multilobar pneumonia -Treated with IV ceftriaxone and azithromycin with this admission, completed antibiotics, doubt pneumonia, suspect this was secondary to fluid  AKI on CKD stage 3a -Baseline creatinine around 1.3-1.4 -Improved  Elevated troponin -Due to anemia, CHF -Echocardiogram did not reveal regional wall motion abnormalities  Multiple myeloma -Followed by Dr. Delton Coombes  CAD status post PCI/DES in 2012 -Continue aspirin, Lipitor, Lopressor   Essential hypertension -Continue Cardizem, Lopressor   Discharge Exam: Vitals:   09/15/19 0856 09/15/19 0859  BP: (!) 99/40 (!) 107/48  Pulse: 88 88  Resp:    Temp:    SpO2: 91%     General: AAOx3 Cardiovascular:S1S2/RRR Respiratory: CTAB  Discharge Instructions   Discharge Instructions    Diet - low sodium heart healthy   Complete by: As directed    Increase activity slowly   Complete by: As directed      Allergies as of 09/15/2019      Reactions   Lisinopril Cough  Plavix [clopidogrel Bisulfate] Swelling      Medication List    STOP taking these medications   acyclovir 200 MG  capsule Commonly known as: ZOVIRAX   levofloxacin 250 MG tablet Commonly known as: Levaquin     TAKE these medications   albuterol 108 (90 Base) MCG/ACT inhaler Commonly known as: VENTOLIN HFA Inhale 2 puffs into the lungs every 6 (six) hours as needed for wheezing or shortness of breath.   aspirin EC 81 MG tablet Take 1 tablet (81 mg total) by mouth daily.   atorvastatin 10 MG tablet Commonly known as: LIPITOR TAKE ONE TABLET BY MOUTH DAILY   Calcium 600+D Plus Minerals 600-400 MG-UNIT Chew Chew 1 tablet by mouth 3 (three) times daily.   cyanocobalamin 1000 MCG/ML injection Commonly known as: (VITAMIN B-12) Inject 1,000 mcg into the skin every 30 (thirty) days.   daratumumab-hyaluronidase-fihj 1800-30000 MG-UT/15ML Soln Commonly known as: DARZALEX FASPRO Inject 1,800 mg into the skin. Weekly weeks 1-8; every 2 weeks weeks 9-24; every 4 weeks after week 24   dexamethasone 2 MG tablet Commonly known as: DECADRON Take five tablets ('10mg'$ ) by mouth weekly. What changed: additional instructions   diazepam 5 MG tablet Commonly known as: VALIUM TAKE ONE TABLET BY MOUTH EVERY 6 HOURS AS NEEDED FOR ANXIETY   diltiazem 240 MG 24 hr capsule Commonly known as: CARDIZEM CD Take 1 capsule (240 mg total) by mouth daily.   dronabinol 5 MG capsule Commonly known as: Marinol Take 1 capsule (5 mg total) by mouth 2 (two) times daily before lunch and supper.   fluticasone 50 MCG/ACT nasal spray Commonly known as: FLONASE Place 2 sprays into both nostrils as needed.   guaiFENesin 600 MG 12 hr tablet Commonly known as: MUCINEX Take 600 mg 2 (two) times daily as needed by mouth for cough or to loosen phlegm.   magnesium oxide 400 (241.3 Mg) MG tablet Commonly known as: MAG-OX Take 1 tablet by mouth 2 (two) times daily.   metoprolol tartrate 25 MG tablet Commonly known as: LOPRESSOR Take 1.5 tablets (37.5 mg total) by mouth 2 (two) times daily.   montelukast 10 MG  tablet Commonly known as: Singulair Take 1 tablet three days prior to your first treatment and the day of treatment. Then take one tablet on the day of treatment there after.   ondansetron 8 MG tablet Commonly known as: ZOFRAN Take 1 tablet (8 mg total) by mouth every 8 (eight) hours as needed for nausea or vomiting.   pantoprazole 40 MG tablet Commonly known as: PROTONIX Take 1 tablet (40 mg total) by mouth 2 (two) times daily. What changed:   when to take this  reasons to take this   polyethylene glycol powder 17 GM/SCOOP powder Commonly known as: GLYCOLAX/MIRALAX Take by mouth as needed.   pomalidomide 2 MG capsule Commonly known as: Pomalyst Take 1 capsule (2 mg total) by mouth daily. Take with water on days 1-21. Repeat every 28 days.   potassium chloride 10 MEQ tablet Commonly known as: KLOR-CON TAKE ONE TABLET BY MOUTH THREE TIMES DAILY.   umeclidinium-vilanterol 62.5-25 MCG/INH Aepb Commonly known as: ANORO ELLIPTA Inhale 1 puff into the lungs daily.   Zoledronic Acid 4 MG Solr Inject into the vein.      Allergies  Allergen Reactions  . Lisinopril Cough  . Plavix [Clopidogrel Bisulfate] Swelling   Follow-up Information    Vyas, Dhruv B, MD. Schedule an appointment as soon as possible for a visit in 1 week(s).  Specialty: Internal Medicine Contact information: [place] [address] [phone]        Martinique, Peter M, MD. Go on 10/16/2019.   Specialty: Cardiology Why: '@3'$ :45pm Contact information: [place] [address] [phone]        Care, [facility].   Specialty: Home Health Services Why: Alvis Lemmings will reach out to schedule home health services. Contact information: [address] [place] [address] [phone]        [name], MD On 09/22/2019.   Specialty: Internal Medicine Why: '@1'$ :00 pm Please bring discharge papers to appt . Contact information: [place] [address] [phone]            The results of significant diagnostics from this hospitalization (including imaging, microbiology, ancillary and laboratory) are listed below for reference.    Significant Diagnostic Studies: DG Chest 2 View  Result Date: 09/09/2019 CLINICAL DATA:  Acute respiratory failure. EXAM: CHEST - 2 VIEW COMPARISON:  Chest radiograph 09/05/2019. FINDINGS: The cardiomediastinal silhouette is unchanged. Bilateral pleural effusions (small right, trace left). Ill-defined opacity within the lateral right lung base is new from prior examination and may reflect atelectasis or consolidation. Similar appearance of diffuse bilateral interstitial prominence. No evidence of pneumothorax. No acute bony abnormality. Partially visualized ACDF hardware. Overlying cardiac monitoring leads. IMPRESSION: Bilateral pleural effusions (small right, trace left). Persistent diffuse bilateral interstitial prominence likely reflecting a component of interstitial edema. Ill-defined opacity within the lateral right lung base, new from prior exam and possibly reflecting atelectasis. Pneumonia cannot be excluded. Electronically Signed   By: Kellie Simmering DO   On: 09/09/2019 09:36   DG Chest 2 View  Result Date: 09/05/2019 CLINICAL DATA:  Low oxygen saturation EXAM: CHEST - 2 VIEW COMPARISON:  09/01/2019 FINDINGS: Bilateral diffuse interstitial thickening. No focal consolidation, pleural effusion or pneumothorax. Stable cardiomediastinal silhouette. No aggressive osseous lesion. IMPRESSION: Bilateral diffuse interstitial thickening which may reflect interstitial edema versus multilobar pneumonia. Electronically Signed   By: Kathreen Devoid   On: 09/05/2019 13:35   DG Chest 2 View  Result Date: 09/02/2019 CLINICAL DATA:  multiple myeloma, short of breath, nausea and vomiting for 1 week EXAM: CHEST - 2 VIEW COMPARISON:  06/30/2019 FINDINGS: Frontal and lateral views of the chest demonstrate a stable  cardiac silhouette. The lungs remain hyperinflated. There is new patchy consolidation at the left lung base, likely within the left lower lobe. No effusion or pneumothorax. No acute bony abnormalities. IMPRESSION: 1. Patchy left basilar consolidation which may reflect bronchopneumonia or subsegmental atelectasis. Electronically Signed   By: Randa Ngo M.D.   On: 09/02/2019 00:01   ECHOCARDIOGRAM COMPLETE  Result Date: 09/05/2019    ECHOCARDIOGRAM REPORT   Patient Name:   ESHAAL DUBY Date of Exam: 09/05/2019 Medical Rec #:  [id number]    Height:       67.0 in Accession #:    [id number]   Weight:       165.3 lb Date of Birth:  03-01-1941    BSA:          1.865 m Patient Age:    79 years     BP:           108/55 mmHg Patient Gender: F            HR:           103 bpm. Exam Location:  [facility] Procedure: 2D Echo STAT  ECHO Indications:    Congestive Heart Failure 428.0 / I50.9  History:        Patient has prior history of Echocardiogram examinations, most                 recent 01/09/2019. CAD, Signs/Symptoms:Dyspnea; Risk                 Factors:Hypertension, Dyslipidemia and Former Smoker. Chronic                 renal disease.  Sonographer:    Leavy Cella RDCS (AE) Referring Phys: [id number] JULIE IDOL IMPRESSIONS  1. Left ventricular ejection fraction, by estimation, is 55 to 60%. The left ventricle has normal function. The left ventricle has no regional wall motion abnormalities. There is mild concentric left ventricular hypertrophy. Left ventricular diastolic parameters are consistent with Grade I diastolic dysfunction (impaired relaxation).  2. Right ventricular systolic function is normal. The right ventricular size is mildly enlarged. There is mildly elevated pulmonary artery systolic pressure.  3. Right atrial size was mildly dilated.  4. The mitral valve is degenerative. Moderate mitral valve regurgitation.  5. Tricuspid valve regurgitation is mild to moderate.  6. The aortic valve is tricuspid. Aortic  valve regurgitation is mild. Mild aortic valve sclerosis is present, with no evidence of aortic valve stenosis.  7. The inferior vena cava is normal in size with greater than 50% respiratory variability, suggesting right atrial pressure of 3 mmHg. FINDINGS  Left Ventricle: Left ventricular ejection fraction, by estimation, is 55 to 60%. The left ventricle has normal function. The left ventricle has no regional wall motion abnormalities. The left ventricular internal cavity size was normal in size. There is  mild concentric left ventricular hypertrophy. Left ventricular diastolic parameters are consistent with Grade I diastolic dysfunction (impaired relaxation). Indeterminate filling pressures. Right Ventricle: The right ventricular size is mildly enlarged. No increase in right ventricular wall thickness. Right ventricular systolic function is normal. There is mildly elevated pulmonary artery systolic pressure. The tricuspid regurgitant velocity is 2.64 m/s, and with an assumed right atrial pressure of 10 mmHg, the estimated right ventricular systolic pressure is 16.1 mmHg. Left Atrium: Left atrial size was normal in size. Right Atrium: Right atrial size was mildly dilated. Pericardium: There is no evidence of pericardial effusion. Mitral Valve: The mitral valve is degenerative in appearance. There is mild thickening of the mitral valve leaflet(s). Mild mitral annular calcification. Moderate mitral valve regurgitation. Tricuspid Valve: The tricuspid valve is grossly normal. Tricuspid valve regurgitation is mild to moderate. Aortic Valve: The aortic valve is tricuspid. . There is mild thickening of the aortic valve. Aortic valve regurgitation is mild. Mild aortic valve sclerosis is present, with no evidence of aortic valve stenosis. Mild aortic valve annular calcification. There is mild thickening of the aortic valve. Pulmonic Valve: The pulmonic valve was grossly normal. Pulmonic valve regurgitation is not  visualized. Aorta: The aortic root is normal in size and structure. Venous: The inferior vena cava is normal in size with greater than 50% respiratory variability, suggesting right atrial pressure of 3 mmHg. IAS/Shunts: The interatrial septum was not well visualized.  LEFT VENTRICLE PLAX 2D LVIDd:         4.78 cm  Diastology LVIDs:         4.12 cm  LV e' lateral:   6.31 cm/s LV PW:         1.21 cm  LV E/e' lateral: 11.7 LV IVS:  1.10 cm  LV e' medial:    5.11 cm/s LVOT diam:     2.00 cm  LV E/e' medial:  14.4 LVOT Area:     3.14 cm  RIGHT VENTRICLE RV S prime:     15.20 cm/s TAPSE (M-mode): 1.8 cm LEFT ATRIUM             Index       RIGHT ATRIUM           Index LA diam:        3.90 cm 2.09 cm/m  RA Area:     17.90 cm LA Vol (A2C):   44.2 ml 23.70 ml/m RA Volume:   51.30 ml  27.50 ml/m LA Vol (A4C):   55.3 ml 29.65 ml/m LA Biplane Vol: 51.6 ml 27.67 ml/m   AORTA Ao Root diam: 2.90 cm MITRAL VALVE                TRICUSPID VALVE MV Area (PHT): 4.49 cm     TR Peak grad:   27.9 mmHg MV Decel Time: 169 msec     TR Vmax:        264.00 cm/s MR Peak grad:   83.2 mmHg MR Mean grad:   55.0 mmHg   SHUNTS MR Vmax:        456.00 cm/s Systemic Diam: 2.00 cm MR Vmean:       352.0 cm/s MR PISA:        1.01 cm MR PISA Radius: 0.40 cm MV E velocity: 73.70 cm/s MV A velocity: 89.30 cm/s MV E/A ratio:  0.83 Kate Sable MD Electronically signed by Kate Sable MD Signature Date/Time: 09/05/2019/2:33:25 PM    Final     Microbiology: No results found for this or any previous visit (from the past 240 hour(s)).   Labs: Basic Metabolic Panel: Recent Labs  Lab 09/23/19 0927  NA 143  K 3.8  CL 110  CO2 24  GLUCOSE 104*  BUN 18  CREATININE 1.29*  CALCIUM 8.2*   Liver Function Tests: Recent Labs  Lab 09/23/19 0927  AST 24  ALT 15  ALKPHOS 74  BILITOT 0.5  PROT 5.9*  ALBUMIN 3.2*   No results for input(s): LIPASE, AMYLASE in the last 168 hours. No results for input(s): AMMONIA in the last  168 hours. CBC: Recent Labs  Lab 09/23/19 0927  WBC 5.9  NEUTROABS 4.1  HGB 11.4*  HCT 37.7  MCV 103.6*  PLT 283   Cardiac Enzymes: No results for input(s): CKTOTAL, CKMB, CKMBINDEX, TROPONINI in the last 168 hours. BNP: BNP (last 3 results) Recent Labs    09/05/19 1052  BNP 1,489.0*    ProBNP (last 3 results) No results for input(s): PROBNP in the last 8760 hours.  CBG: No results for input(s): GLUCAP in the last 168 hours.     Signed:  Domenic Polite MD.  [facility] 09/28/2019, 3:37 PM

## 2019-10-01 ENCOUNTER — Inpatient Hospital Stay (HOSPITAL_BASED_OUTPATIENT_CLINIC_OR_DEPARTMENT_OTHER): Payer: Medicare Other | Admitting: Hematology

## 2019-10-01 ENCOUNTER — Other Ambulatory Visit: Payer: Self-pay

## 2019-10-01 ENCOUNTER — Encounter (HOSPITAL_COMMUNITY): Payer: Self-pay | Admitting: Hematology

## 2019-10-01 ENCOUNTER — Inpatient Hospital Stay (HOSPITAL_COMMUNITY): Payer: Medicare Other

## 2019-10-01 VITALS — BP 112/63 | HR 93 | Temp 96.9°F | Resp 19 | Wt 167.4 lb

## 2019-10-01 DIAGNOSIS — C9000 Multiple myeloma not having achieved remission: Secondary | ICD-10-CM | POA: Diagnosis not present

## 2019-10-01 LAB — COMPREHENSIVE METABOLIC PANEL
ALT: 11 U/L (ref 0–44)
AST: 29 U/L (ref 15–41)
Albumin: 3.2 g/dL — ABNORMAL LOW (ref 3.5–5.0)
Alkaline Phosphatase: 82 U/L (ref 38–126)
Anion gap: 12 (ref 5–15)
BUN: 17 mg/dL (ref 8–23)
CO2: 26 mmol/L (ref 22–32)
Calcium: 8.6 mg/dL — ABNORMAL LOW (ref 8.9–10.3)
Chloride: 104 mmol/L (ref 98–111)
Creatinine, Ser: 1.52 mg/dL — ABNORMAL HIGH (ref 0.44–1.00)
GFR calc Af Amer: 38 mL/min — ABNORMAL LOW (ref >60)
GFR calc non Af Amer: 32 mL/min — ABNORMAL LOW (ref >60)
Glucose, Bld: 110 mg/dL — ABNORMAL HIGH (ref 70–99)
Potassium: 3.9 mmol/L (ref 3.5–5.1)
Sodium: 142 mmol/L (ref 135–145)
Total Bilirubin: 0.6 mg/dL (ref 0.3–1.2)
Total Protein: 5.9 g/dL — ABNORMAL LOW (ref 6.5–8.1)

## 2019-10-01 LAB — CBC WITH DIFFERENTIAL/PLATELET
Abs Immature Granulocytes: 0.01 10*3/uL (ref 0.00–0.07)
Basophils Absolute: 0 10*3/uL (ref 0.0–0.1)
Basophils Relative: 1 %
Eosinophils Absolute: 0.1 10*3/uL (ref 0.0–0.5)
Eosinophils Relative: 2 %
HCT: 36 % (ref 36.0–46.0)
Hemoglobin: 11 g/dL — ABNORMAL LOW (ref 12.0–15.0)
Immature Granulocytes: 0 %
Lymphocytes Relative: 14 %
Lymphs Abs: 0.8 10*3/uL (ref 0.7–4.0)
MCH: 31.4 pg (ref 26.0–34.0)
MCHC: 30.6 g/dL (ref 30.0–36.0)
MCV: 102.9 fL — ABNORMAL HIGH (ref 80.0–100.0)
Monocytes Absolute: 0.9 10*3/uL (ref 0.1–1.0)
Monocytes Relative: 16 %
Neutro Abs: 3.6 10*3/uL (ref 1.7–7.7)
Neutrophils Relative %: 67 %
Platelets: 196 10*3/uL (ref 150–400)
RBC: 3.5 MIL/uL — ABNORMAL LOW (ref 3.87–5.11)
RDW: 17.3 % — ABNORMAL HIGH (ref 11.5–15.5)
WBC: 5.4 10*3/uL (ref 4.0–10.5)
nRBC: 0 % (ref 0.0–0.2)

## 2019-10-01 LAB — LACTATE DEHYDROGENASE: LDH: 212 U/L — ABNORMAL HIGH (ref 98–192)

## 2019-10-01 MED ORDER — SODIUM CHLORIDE 0.9 % IV SOLN: INTRAVENOUS | Status: AC

## 2019-10-01 NOTE — Patient Instructions (Addendum)
[facility] at [facility] Discharge Instructions  You were seen today by Dr. Katragadda. He went over your recent lab results. He will see you back in 2 weeks for labs and follow up.   Thank you for choosing [facility] at [facility] to provide your oncology and hematology care.  To afford each patient quality time with our provider, please arrive at least 15 minutes before your scheduled appointment time.   If you have a lab appointment with the Cancer Center please come in thru the  Main Entrance and check in at the main information desk  You need to re-schedule your appointment should you arrive 10 or more minutes late.  We strive to give you quality time with our providers, and arriving late affects you and other patients whose appointments are after yours.  Also, if you no show three or more times for appointments you may be dismissed from the clinic at the providers discretion.     Again, thank you for choosing [facility].  Our hope is that these requests will decrease the amount of time that you wait before being seen by our physicians.       _____________________________________________________________  Should you have questions after your visit to [facility], please contact our office at [phone] between the hours of 8:00 a.m. and 4:30 p.m.  Voicemails left after 4:00 p.m. will not be returned until the following business day.  For prescription refill requests, have your pharmacy contact our office and allow 72 hours.    Cancer Center Support Programs:   > Cancer Support Group  2nd Tuesday of the month 1pm-2pm, Journey Room    

## 2019-10-01 NOTE — Progress Notes (Signed)
1050 Labs reviewed with and pt seen by Dr. Delton Coombes and pt to receive 500 ml NS over 1 hour and labs drawn per MD orders Wandra Arthurs Buening tolerated IV hydration well without complaints or incident. Peripheral IV site checked with positive blood return noted prior to and after infusion. Pt discharged self ambulatory in satisfactory condition accompanied by a friend

## 2019-10-01 NOTE — Patient Instructions (Signed)
[facility] at [facility] Discharge Instructions  Received IV hydration today. Follow-up as scheduled. Call clinic for any questions or concerns   Thank you for choosing [facility] at [facility] to provide your oncology and hematology care.  To afford each patient quality time with our provider, please arrive at least 15 minutes before your scheduled appointment time.   If you have a lab appointment with the Cancer Center please come in thru the Main Entrance and check in at the main information desk.  You need to re-schedule your appointment should you arrive 10 or more minutes late.  We strive to give you quality time with our providers, and arriving late affects you and other patients whose appointments are after yours.  Also, if you no show three or more times for appointments you may be dismissed from the clinic at the providers discretion.     Again, thank you for choosing [facility].  Our hope is that these requests will decrease the amount of time that you wait before being seen by our physicians.       _____________________________________________________________  Should you have questions after your visit to [facility], please contact our office at [phone] between the hours of 8:00 a.m. and 4:30 p.m.  Voicemails left after 4:00 p.m. will not be returned until the following business day.  For prescription refill requests, have your pharmacy contact our office and allow 72 hours.    Due to Covid, you will need to wear a mask upon entering the hospital. If you do not have a mask, a mask will be given to you at the Main Entrance upon arrival. For doctor visits, patients may have 1 support person with them. For treatment visits, patients can not have anyone with them due to social distancing guidelines and our immunocompromised population.     

## 2019-10-01 NOTE — Progress Notes (Signed)
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    03/18/2015 - 07/28/2015 Chemotherapy   Velcade 1.6 mg/m2 discontinued secondary to intolerance   07/28/2015 Adverse Reaction   stool incontinence, weakness, collapse upon standing, felt to be secondary to velcade   11/09/2015 - 12/13/2015 Chemotherapy   cytoxan IV 300 mg/m2 administered X 2 doses only in addition to rev/dex   11/09/2015 - 06/08/2016 Chemotherapy   Revlimid/Dexamethasone    02/05/2017 - 07/09/2017 Chemotherapy   The patient had bortezomib SQ (VELCADE) chemo injection 1.75 mg, 1 mg/m2 = 1.75 mg (66.7 % of original dose 1.5 mg/m2), Subcutaneous,  Once, 1 of 8 cycles Dose modification: 1.5 mg/m2 (original dose 1.5 mg/m2, Cycle 1, Reason: Provider Judgment, Comment: Per Dr. Norma Fredrickson recommendations from [facility] 1 mg/m2 (original dose 1.5 mg/m2, Cycle 1, Reason: Provider Judgment)  for chemotherapy treatment.     07/09/2017 Adverse Reaction   Diarrhea    Chemotherapy   Pomalyst 2 mg (Days 1-21 every 28 days), Ixazomib 2.3 mg (days 1, 8, 15 every 28 days), and Dexamethasone 10 mg (weekly)- Rx's printed on 08/24/2017.  Treatment recommendations from [facility]ricks[facility] at [facility].   07/28/2019 -  Chemotherapy   The patient had daratumumab-hyaluronidase-fihj (DARZALEX FASPRO) 1800-30000 MG-UT/15ML chemo SQ injection 1,800 mg, 1,800 mg, Subcutaneous,  Once, 1 of 10 cycles Administration: 1,800 mg (07/28/2019), 1,800 mg (08/04/2019), 1,800 mg (08/11/2019), 1,800 mg (08/25/2019)  for chemotherapy  treatment.       CANCER STAGING: Cancer Staging No matching staging information was found for the patient.   INTERVAL HISTORY:  Carol Bradley 79 y.o. female seen for follow-up of multiple myeloma and follow-up after recent hospitalization.  Reported appetite and energy levels of 25%.  Low back pain is reported as 5 out of 10.  She is still feeling weak.  She had a hospitalization from 09/05/2019 through 09/15/2019 for anemia, weakness, shortness of breath.  She was also treated for community-acquired pneumonia.  She had a EGD done on 09/10/2019.  She also had echocardiogram done on the day of admission.  She is taking Marinol 5 mg twice daily for appetite.  Denies any bleeding per rectum or melena.  REVIEW OF SYSTEMS:  Review of Systems  Constitutional: Positive for fatigue.  Musculoskeletal: Positive for back pain.  All other systems reviewed and are negative.    PAST MEDICAL/SURGICAL HISTORY:  Past Medical History:  Diagnosis Date  . 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 CAD (coronary artery disease)   . COPD (chronic obstructive pulmonary disease) ([place])   . History of tobacco abuse   . Hyperlipidemia   . Hypertension   . 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 Multiple myeloma ([place])   . Vitamin B12 deficiency 04/15/2016   Overview:  Vitamin B12 level documented 155, January 2016 with the normal range being 211-924   Past Surgical History:  Procedure Laterality Date  . BACK SURGERY    . CARDIAC CATHETERIZATION  04/2011   right and left cath showing normal right heart pressures,but newly diagnosed coronary artery  disease/drug eluting stent placed to RCA with residual disease in the proximal RCA and LAD and ramus, normal LV function and 60-65% EF  . COLONOSCOPY N/A 09/30/2014   Procedure: COLONOSCOPY;  Surgeon: Rogene Houston, MD;  [facility];  Service: Endoscopy;  Laterality: N/A;  225  . CORONARY ANGIOPLASTY WITH STENT PLACEMENT  04/2011    mid RCA: 3.0 X38 mm Promus DES. Residual 40% disease proximally  . ESOPHAGOGASTRODUODENOSCOPY N/A 09/10/2019   Procedure: ESOPHAGOGASTRODUODENOSCOPY (EGD);  Surgeon: Clarene Essex, MD;  Location: [facility];  Service: Endoscopy;  Laterality: N/A;  . NECK SURGERY       SOCIAL HISTORY:  Social History   Socioeconomic History  . Marital status: Widowed    Spouse name: Not on file  . Number of children: Not on file  . Years of education: Not on file  . Highest education level: Not on file  Occupational History  . 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 Smoking status: Former Smoker    Packs/day: 3.00    Years: 25.00    Pack years: 75.00    Types: Cigarettes    Quit date: 07/10/1994    Years since quitting: 25.2  . Smokeless tobacco: Never Used  Substance and Sexual Activity  . Alcohol use: No  . Drug use: Never  . Sexual activity: Not on file  Other Topics Concern  . Not on file  Social History Narrative  . Not on file   Social Determinants of Health   Financial Resource Strain:   . Difficulty of Paying Living Expenses:   Food Insecurity: No Food Insecurity  . Worried About Charity fundraiser in the Last Year: Never true  . Ran Out of Food in the Last Year: Never true  Transportation Needs: No Transportation Needs  . Lack of Transportation (Medical): No  . Lack of Transportation (Non-Medical): No  Physical Activity:   . Days of Exercise per Week:   . Minutes of Exercise per Session:   Stress:   . Feeling of Stress :   Social Connections:   . Frequency of Communication with Friends and Family:   . Frequency of Social Gatherings with Friends and Family:   . Attends Religious Services:   . Active Member of Clubs or Organizations:   . Attends Archivist Meetings:   Marland Kitchen Marital Status:   Intimate Partner Violence:   . Fear of Current or Ex-Partner:   . Emotionally Abused:   Marland Kitchen Physically Abused:   . Sexually Abused:     FAMILY  HISTORY:  Family History  Problem Relation Age of Onset  . Heart failure Mother   . Cancer Father     CURRENT MEDICATIONS:  Outpatient Encounter Medications as of 10/01/2019  Medication Sig Note  . aspirin EC 81 MG tablet Take 1 tablet (81 mg total) by mouth daily.   Marland Kitchen atorvastatin (LIPITOR) 10 MG tablet TAKE ONE TABLET BY MOUTH DAILY   . Calcium Carbonate-Vit D-Min (CALCIUM 600+D PLUS MINERALS) 600-400 MG-UNIT CHEW Chew 1 tablet by mouth 3 (three) times daily.    . cyanocobalamin (,VITAMIN B-12,) 1000 MCG/ML injection Inject 1,000 mcg into the skin every 30 (thirty) days.    . daratumumab-hyaluronidase-fihj (DARZALEX FASPRO) 1800-30000 MG-UT/15ML SOLN Inject 1,800 mg into the skin. Weekly weeks 1-8; every 2 weeks weeks 9-24; every 4 weeks after week 24   . diltiazem (CARDIZEM CD) 240 MG 24 hr capsule Take 1 capsule (240  mg total) by mouth daily.   Marland Kitchen dronabinol (MARINOL) 5 MG capsule Take 1 capsule (5 mg total) by mouth 2 (two) times daily before lunch and supper.   . furosemide (LASIX) 20 MG tablet Take 20 mg by mouth daily.   . magnesium oxide (MAG-OX) 400 (241.3 Mg) MG tablet Take 1 tablet by mouth 2 (two) times daily.   . metoprolol tartrate (LOPRESSOR) 25 MG tablet Take 1.5 tablets (37.5 mg total) by mouth 2 (two) times daily.   . mirtazapine (REMERON) 15 MG tablet Take 15 mg by mouth at bedtime.   . montelukast (SINGULAIR) 10 MG tablet Take 1 tablet three days prior to your first treatment and the day of treatment. Then take one tablet on the day of treatment there after.   . pantoprazole (PROTONIX) 40 MG tablet Take 1 tablet (40 mg total) by mouth 2 (two) times daily.   . pomalidomide (POMALYST) 2 MG capsule Take 1 capsule (2 mg total) by mouth daily. Take with water on days 1-21. Repeat every 28 days.   . potassium chloride (K-DUR) 10 MEQ tablet TAKE ONE TABLET BY MOUTH THREE TIMES DAILY.   Marland Kitchen umeclidinium-vilanterol (ANORO ELLIPTA) 62.5-25 MCG/INH AEPB Inhale 1 puff into the lungs  daily.   . Zoledronic Acid 4 MG SOLR Inject into the vein.   Marland Kitchen albuterol (VENTOLIN HFA) 108 (90 Base) MCG/ACT inhaler Inhale 2 puffs into the lungs every 6 (six) hours as needed for wheezing or shortness of breath. (Patient not taking: Reported on 10/01/2019)   . diazepam (VALIUM) 5 MG tablet TAKE ONE TABLET BY MOUTH EVERY 6 HOURS AS NEEDED FOR ANXIETY (Patient not taking: Reported on 09/01/2019)   . fluticasone (FLONASE) 50 MCG/ACT nasal spray Place 2 sprays into both nostrils as needed.    Marland Kitchen guaiFENesin (MUCINEX) 600 MG 12 hr tablet Take 600 mg 2 (two) times daily as needed by mouth for cough or to loosen phlegm.   . ondansetron (ZOFRAN) 8 MG tablet Take 1 tablet (8 mg total) by mouth every 8 (eight) hours as needed for nausea or vomiting. (Patient not taking: Reported on 10/01/2019)   . polyethylene glycol powder (GLYCOLAX/MIRALAX) 17 GM/SCOOP powder Take by mouth as needed.    . [DISCONTINUED] dexamethasone (DECADRON) 2 MG tablet Take five tablets ('10mg'$ ) by mouth weekly. (Patient not taking: Reported on 10/01/2019) 12/26/2018: [facility] instructed patient to take medication weekly  . [DISCONTINUED] furosemide (LASIX) 40 MG tablet     No facility-administered encounter medications on file as of 10/01/2019.    ALLERGIES:  Allergies  Allergen Reactions  . Lisinopril Cough  . Plavix [Clopidogrel Bisulfate] Swelling     PHYSICAL EXAM:  ECOG Performance status: 1  Vitals:   10/01/19 1021  BP: 112/63  Pulse: 93  Resp: 19  Temp: (!) 96.9 F (36.1 C)  SpO2: 100%   Filed Weights   10/01/19 1021  Weight: 167 lb 6.4 oz (75.9 kg)    Physical Exam Vitals reviewed.  Constitutional:      Appearance: Normal appearance.  Cardiovascular:     Rate and Rhythm: Normal rate and regular rhythm.     Heart sounds: Normal heart sounds.  Pulmonary:     Effort: Pulmonary effort is normal.     Breath sounds: Normal breath sounds.  Abdominal:     General: There is no distension.     Palpations: Abdomen  is soft. There is no mass.  Musculoskeletal:        General: No swelling.  Skin:  General: Skin is warm.  Neurological:     General: No focal deficit present.     Mental Status: She is alert and oriented to person, place, and time.  Psychiatric:        Mood and Affect: Mood normal.        Behavior: Behavior normal.      LABORATORY DATA:  I have reviewed the labs as listed.  CBC    Component Value Date/Time   WBC 5.4 10/01/2019 0904   RBC 3.50 (L) 10/01/2019 0904   HGB 11.0 (L) 10/01/2019 0904   HCT 36.0 10/01/2019 0904   PLT 196 10/01/2019 0904   MCV 102.9 (H) 10/01/2019 0904   MCH 31.4 10/01/2019 0904   MCHC 30.6 10/01/2019 0904   RDW 17.3 (H) 10/01/2019 0904   LYMPHSABS 0.8 10/01/2019 0904   MONOABS 0.9 10/01/2019 0904   EOSABS 0.1 10/01/2019 0904   BASOSABS 0.0 10/01/2019 0904   CMP Latest Ref Rng & Units 10/01/2019 09/23/2019 09/15/2019  Glucose 70 - 99 mg/dL 110(H) 104(H) 104(H)  BUN 8 - 23 mg/dL '17 18 12  '$ Creatinine 0.44 - 1.00 mg/dL 1.52(H) 1.29(H) 1.34(H)  Sodium 135 - 145 mmol/L 142 143 145  Potassium 3.5 - 5.1 mmol/L 3.9 3.8 3.9  Chloride 98 - 111 mmol/L 104 110 109  CO2 22 - 32 mmol/L '26 24 28  '$ Calcium 8.9 - 10.3 mg/dL 8.6(L) 8.2(L) 8.1(L)  Total Protein 6.5 - 8.1 g/dL 5.9(L) 5.9(L) -  Total Bilirubin 0.3 - 1.2 mg/dL 0.6 0.5 -  Alkaline Phos 38 - 126 U/L 82 74 -  AST 15 - 41 U/L 29 24 -  ALT 0 - 44 U/L 11 15 -       DIAGNOSTIC IMAGING:  I have independently reviewed scans.   I have reviewed Venita Lick LPN's note and agree with the documentation.  I personally performed a face-to-face visit, made revisions and my assessment and plan is as follows.    ASSESSMENT & PLAN:   Multiple myeloma (HCC) 1.  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 -Last treatment was on 08/25/2019. -She was hospitalized due to severe anemia and weakness and shortness of breath  from 09/05/2019 through 09/15/2019.  2D echo on 09/05/2019 shows EF 55 to 60%. -She is still weak at this time.  We will hold off on her treatment today.  Patient thinks that the treatment is too strong for her.  We will reach out to Dr. Norma Fredrickson and, with the treatment plan. -I have talked to her son while patient is in the room.  I have done myeloma labs today.  I plan to see her back in 2 weeks for follow-up.  2.  CKD: -Baseline creatinine is between 1.2-1.3.  Today it increased to 1.52.  I will give her 500 and of normal saline.  3.  Normocytic anemia: -Hemoglobin today is 11.  This is from combination of CKD and myelosuppression.  She is on intermittent Aranesp and Feraheme. 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  4.  Myeloma bone disease: -She is receiving Zometa every 3 months.  Last dose was on 07/18/2019.  She will continue calcium and vitamin D supplements. -Calcium today is 8.6.  5.  Decreased appetite: -She will continue Marinol 5 mg twice daily.     Orders placed this encounter:  Orders Placed This Encounter  Procedures  . Protein electrophoresis, serum  . Kappa/lambda light chains  .  Lactate dehydrogenase   Total time spent is 30 minutes with more than 70% of the time spent face-to-face discussing labs, treatment plan, review of hospital records, counseling and coordination of care.   Derek Jack, MD [place] [phone]

## 2019-10-02 ENCOUNTER — Other Ambulatory Visit: Payer: Self-pay | Admitting: *Deleted

## 2019-10-02 ENCOUNTER — Encounter: Payer: Self-pay | Admitting: *Deleted

## 2019-10-02 LAB — KAPPA/LAMBDA LIGHT CHAINS
Kappa free light chain: 42.4 mg/L — ABNORMAL HIGH (ref 3.3–19.4)
Kappa, lambda light chain ratio: 1.2 (ref 0.26–1.65)
Lambda free light chains: 35.2 mg/L — ABNORMAL HIGH (ref 5.7–26.3)

## 2019-10-02 NOTE — Patient Outreach (Signed)
[place] [facility]) Care Management  10/02/2019  Janas Triska [facility] 1941-01-17 [id number]   [facility] outreach for EMMI general referral follow up after below red on emmi alert   EMMI-general discharge- Discharged from [place] with [facility] home health PT,OT, aide ON APL RED ON [place] Day # 1 Date: Wednesday 09/18/19 [place] Reason: Know who to call about changes in condition? No   Insurance: Next[employer] Medicare  [facility] admissions x 1 ED visits x 1 in the last 6 months    Outreach attempt # 2 successful to the home number  Mrs Schlauch answered. [facility] RN CM reviewed the reason for the follow up on her EMMI outreach on 09/19/19  Discussed [facility] RN had previously spoken with [name],  who is on the [employer] (DPR) indicating medical staff have permission to speak with him answers  While she was with home health [place]) Physical therapy (PT) at the time of this call  Mrs [place] inquired more about [facility] RN CM CM discussed EMMI program and partnership with [place] She verified her date of birth for HIPAA She informed [facility] RN CM she was doing well  She thanked 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 She previously lived alone. She is needing assistance with all care needs and transportation at this time.   Conditions: Chronic congestive Heart Failure (CHF), Hypertension (HTN), former smoker   DME: eyeglasses    Appointments: 10/16/19 cardiology Dr Roby Lofts 10/30/19 Optometry Dr Heloise Beecham   Advance Directives: her son [name] is her [facility])   Consent: [facility] RN CM reviewed [facility] services with patient. Patient gave verbal consent for services [facility] telephonic RN CM.   Advised patient that there will be further automated EMMI-post discharge calls to assess how the patient is doing following the recent hospitalization Advised the patient that another call may be received from a nurse if any of their responses were abnormal. Patient voiced understanding and was appreciative of f/u call.   Plan: [facility] RN CM will follow up with Mrs Bretado within the next 28-35 business days as agreed upon for further follow up, assessment of care coordination and disease management needs  Pt encouraged to return a call to [facility] RN CM prn   Routed note to MDs/NP/PA  [facility] CM Care Plan Problem One     Most Recent Value  Care Plan Problem One  knowledge deficit for major medical conditions CHF, HTn  Role Documenting the Problem One  Care Management Telephonic Coordinator  Care Plan for Problem One  Active  THN Long Term Goal   Over the next 90 days, patient will not be hospitalized for complications related to chronic illnesses  [facility]N Long Term Goal Start Date  09/19/19  Interventions for Problem One Long Term Goal  followed up EMMI red alert status Spoke with Mrs Griner  [facility] CM Short Term Goal #1   over the next 31 days patient will be able to verbalize with outreach  interventions to manage CHF at home  [facility] CM Short Term Goal #1 Start Date  09/19/19  Interventions for Short Term Goal #1  Assess medical status, improvement Pt denied concerns on 10/02/19       Milyn Stapleton [name], RN, BSN, [place] Coordinator Office number [phone] Mobile number [phone]  Main THN number [phone] Fax number [phone]

## 2019-10-03 LAB — PROTEIN ELECTROPHORESIS, SERUM
A/G Ratio: 1.2 (ref 0.7–1.7)
Albumin ELP: 3.1 g/dL (ref 2.9–4.4)
Alpha-1-Globulin: 0.3 g/dL (ref 0.0–0.4)
Alpha-2-Globulin: 0.9 g/dL (ref 0.4–1.0)
Beta Globulin: 0.9 g/dL (ref 0.7–1.3)
Gamma Globulin: 0.5 g/dL (ref 0.4–1.8)
Globulin, Total: 2.6 g/dL (ref 2.2–3.9)
M-Spike, %: 0.3 g/dL — ABNORMAL HIGH
Total Protein ELP: 5.7 g/dL — ABNORMAL LOW (ref 6.0–8.5)

## 2019-10-04 NOTE — Assessment & Plan Note (Addendum)
1.  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 -Last treatment was on 08/25/2019. -She was hospitalized due to severe anemia and weakness and shortness of breath from 09/05/2019 through 09/15/2019.  2D echo on 09/05/2019 shows EF 55 to 60%. -She is still weak at this time.  We will hold off on her treatment today.  Patient thinks that the treatment is too strong for her.  We will reach out to Dr. Rodriguez and, with the treatment plan. -I have talked to her son while patient is in the room.  I have done myeloma labs today.  I plan to see her back in 2 weeks for follow-up.  2.  CKD: -Baseline creatinine is between 1.2-1.3.  Today it increased to 1.52.  I will give her 500 and of normal saline.  3.  Normocytic anemia: -Hemoglobin today is 11.  This is from combination of CKD and myelosuppression.  She is on intermittent Aranesp and Feraheme. 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  4.  Myeloma bone disease: -She is receiving Zometa every 3 months.  Last dose was on 07/18/2019.  She will continue calcium and vitamin D supplements. -Calcium today is 8.6.  5.  Decreased appetite: -She will continue Marinol 5 mg twice daily. 

## 2019-10-07 ENCOUNTER — Other Ambulatory Visit (HOSPITAL_COMMUNITY): Payer: Self-pay | Admitting: *Deleted

## 2019-10-07 DIAGNOSIS — C9000 Multiple myeloma not having achieved remission: Secondary | ICD-10-CM

## 2019-10-15 ENCOUNTER — Inpatient Hospital Stay (HOSPITAL_COMMUNITY): Payer: Medicare Other

## 2019-10-15 ENCOUNTER — Inpatient Hospital Stay (HOSPITAL_COMMUNITY): Payer: Medicare Other | Attending: Hematology | Admitting: Hematology

## 2019-10-15 ENCOUNTER — Other Ambulatory Visit: Payer: Self-pay

## 2019-10-15 VITALS — BP 111/58 | HR 92 | Temp 97.1°F | Resp 18 | Wt 167.0 lb

## 2019-10-15 DIAGNOSIS — D631 Anemia in chronic kidney disease: Secondary | ICD-10-CM

## 2019-10-15 DIAGNOSIS — Z79899 Other long term (current) drug therapy: Secondary | ICD-10-CM | POA: Diagnosis not present

## 2019-10-15 DIAGNOSIS — K259 Gastric ulcer, unspecified as acute or chronic, without hemorrhage or perforation: Secondary | ICD-10-CM | POA: Insufficient documentation

## 2019-10-15 DIAGNOSIS — M7989 Other specified soft tissue disorders: Secondary | ICD-10-CM | POA: Diagnosis not present

## 2019-10-15 DIAGNOSIS — D649 Anemia, unspecified: Secondary | ICD-10-CM | POA: Diagnosis not present

## 2019-10-15 DIAGNOSIS — Z8249 Family history of ischemic heart disease and other diseases of the circulatory system: Secondary | ICD-10-CM | POA: Diagnosis not present

## 2019-10-15 DIAGNOSIS — I2510 Atherosclerotic heart disease of native coronary artery without angina pectoris: Secondary | ICD-10-CM | POA: Insufficient documentation

## 2019-10-15 DIAGNOSIS — E785 Hyperlipidemia, unspecified: Secondary | ICD-10-CM | POA: Diagnosis not present

## 2019-10-15 DIAGNOSIS — Z87891 Personal history of nicotine dependence: Secondary | ICD-10-CM | POA: Diagnosis not present

## 2019-10-15 DIAGNOSIS — C9000 Multiple myeloma not having achieved remission: Secondary | ICD-10-CM | POA: Diagnosis present

## 2019-10-15 DIAGNOSIS — R531 Weakness: Secondary | ICD-10-CM | POA: Diagnosis not present

## 2019-10-15 DIAGNOSIS — R42 Dizziness and giddiness: Secondary | ICD-10-CM | POA: Insufficient documentation

## 2019-10-15 DIAGNOSIS — Z809 Family history of malignant neoplasm, unspecified: Secondary | ICD-10-CM | POA: Insufficient documentation

## 2019-10-15 DIAGNOSIS — R0602 Shortness of breath: Secondary | ICD-10-CM | POA: Diagnosis not present

## 2019-10-15 DIAGNOSIS — E538 Deficiency of other specified B group vitamins: Secondary | ICD-10-CM | POA: Insufficient documentation

## 2019-10-15 DIAGNOSIS — N189 Chronic kidney disease, unspecified: Secondary | ICD-10-CM | POA: Diagnosis not present

## 2019-10-15 DIAGNOSIS — R630 Anorexia: Secondary | ICD-10-CM | POA: Insufficient documentation

## 2019-10-15 LAB — COMPREHENSIVE METABOLIC PANEL
ALT: 20 U/L (ref 0–44)
AST: 30 U/L (ref 15–41)
Albumin: 3.4 g/dL — ABNORMAL LOW (ref 3.5–5.0)
Alkaline Phosphatase: 74 U/L (ref 38–126)
Anion gap: 12 (ref 5–15)
BUN: 18 mg/dL (ref 8–23)
CO2: 28 mmol/L (ref 22–32)
Calcium: 9.6 mg/dL (ref 8.9–10.3)
Chloride: 102 mmol/L (ref 98–111)
Creatinine, Ser: 1.55 mg/dL — ABNORMAL HIGH (ref 0.44–1.00)
GFR calc Af Amer: 37 mL/min — ABNORMAL LOW (ref >60)
GFR calc non Af Amer: 32 mL/min — ABNORMAL LOW (ref >60)
Glucose, Bld: 119 mg/dL — ABNORMAL HIGH (ref 70–99)
Potassium: 3.8 mmol/L (ref 3.5–5.1)
Sodium: 142 mmol/L (ref 135–145)
Total Bilirubin: 0.7 mg/dL (ref 0.3–1.2)
Total Protein: 5.9 g/dL — ABNORMAL LOW (ref 6.5–8.1)

## 2019-10-15 LAB — CBC WITH DIFFERENTIAL/PLATELET
Abs Immature Granulocytes: 0.01 10*3/uL (ref 0.00–0.07)
Basophils Absolute: 0 10*3/uL (ref 0.0–0.1)
Basophils Relative: 1 %
Eosinophils Absolute: 0.1 10*3/uL (ref 0.0–0.5)
Eosinophils Relative: 2 %
HCT: 35.7 % — ABNORMAL LOW (ref 36.0–46.0)
Hemoglobin: 11.2 g/dL — ABNORMAL LOW (ref 12.0–15.0)
Immature Granulocytes: 0 %
Lymphocytes Relative: 13 %
Lymphs Abs: 1 10*3/uL (ref 0.7–4.0)
MCH: 31.4 pg (ref 26.0–34.0)
MCHC: 31.4 g/dL (ref 30.0–36.0)
MCV: 100 fL (ref 80.0–100.0)
Monocytes Absolute: 0.9 10*3/uL (ref 0.1–1.0)
Monocytes Relative: 13 %
Neutro Abs: 5.2 10*3/uL (ref 1.7–7.7)
Neutrophils Relative %: 71 %
Platelets: 246 10*3/uL (ref 150–400)
RBC: 3.57 MIL/uL — ABNORMAL LOW (ref 3.87–5.11)
RDW: 15.6 % — ABNORMAL HIGH (ref 11.5–15.5)
WBC: 7.2 10*3/uL (ref 4.0–10.5)
nRBC: 0 % (ref 0.0–0.2)

## 2019-10-15 MED ORDER — DRONABINOL 10 MG PO CAPS: 10.0000 mg | ORAL_CAPSULE | Freq: Two times a day (BID) | ORAL | 1 refills | Status: DC

## 2019-10-15 NOTE — Assessment & Plan Note (Addendum)
1.  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 -Last treatment on 08/25/2019. -She was hospitalized due to severe anemia, weakness and pneumonia and shortness of breath from 09/05/2019 through 09/15/2019.  2D echo on 09/05/2019 shows EF 55-60%. -We reviewed myeloma labs from 10/01/2019.  M spike is 0.3 g (1.1 g previously on 06/22/2020).  Kappa light chains are 42 (629) and ratio is 1.2 (70.92). -She has gotten excellent response with limited treatment.  I have talked to her son [name] on the phone. -However she is continuing to feel very weak.  I would not start her treatment at this time.  I will reevaluate her in 2 weeks. -Obviously, she has not tolerated combination daratumumab and pomalidomide very well.  We will start them 1 at a time when she is ready.  2.  CKD: -Baseline creatinine is between 1.2-1.3.  Creatinine has been slightly elevated at 1.5 last 2 times.  She is also on Lasix.  3.  Normocytic anemia: -Hemoglobin today is 11.2.  This is from combination of CKD and myelosuppression.  She is on intermittent Aranesp and Feraheme. 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  She denies any melena or bleeding per rectum.  4.  Myeloma bone disease: -She is receiving Zometa every 3 months.  Last dose was on 07/18/2019.  She will continue calcium and vitamin D supplements.  Calcium today is 9.6.  5.  Decreased appetite: -She was taking Marinol 5 mg twice daily.  She did not see any improvement in appetite. -I will increase the dose to 10 mg twice daily.

## 2019-10-15 NOTE — Patient Instructions (Signed)
[place] at [facility] Discharge Instructions  You were seen today by Dr. Delton Coombes. He went over your recent lab results. Dr. Delton Coombes wants you to increase Marinol to 10mg  twice daily.  He will send in a new prescription.  You can take two tablet twice daily of what you have at home in the meantime.  He will see you back in 2 weeks for labs and follow up.   Thank you for choosing [place] at [facility] to provide your oncology and hematology care.  To afford each patient quality time with our provider, please arrive at least 15 minutes before your scheduled appointment time.   If you have a lab appointment with the [place] please come in thru the  Main Entrance and check in at the main information desk  You need to re-schedule your appointment should you arrive 10 or more minutes late.  We strive to give you quality time with our providers, and arriving late affects you and other patients whose appointments are after yours.  Also, if you no show three or more times for appointments you may be dismissed from the clinic at the providers discretion.     Again, thank you for choosing [facility].  Our hope is that these requests will decrease the amount of time that you wait before being seen by our physicians.       _____________________________________________________________  Should you have questions after your visit to [facility], please contact our office at [phone] between the hours of 8:00 a.m. and 4:30 p.m.  Voicemails left after 4:00 p.m. will not be returned until the following business day.  For prescription refill requests, have your pharmacy contact our office and allow 72 hours.    Cancer Center Support Programs:   > Cancer Support Group  2nd Tuesday of the month 1pm-2pm, Journey Room

## 2019-10-15 NOTE — Progress Notes (Signed)
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    03/18/2015 - 07/28/2015 Chemotherapy   Velcade 1.6 mg/m2 discontinued secondary to intolerance   07/28/2015 Adverse Reaction   stool incontinence, weakness, collapse upon standing, felt to be secondary to velcade   11/09/2015 - 12/13/2015 Chemotherapy   cytoxan IV 300 mg/m2 administered X 2 doses only in addition to rev/dex   11/09/2015 - 06/08/2016 Chemotherapy   Revlimid/Dexamethasone    02/05/2017 - 07/09/2017 Chemotherapy   The patient had bortezomib SQ (VELCADE) chemo injection 1.75 mg, 1 mg/m2 = 1.75 mg (66.7 % of original dose 1.5 mg/m2), Subcutaneous,  Once, 1 of 8 cycles Dose modification: 1.5 mg/m2 (original dose 1.5 mg/m2, Cycle 1, Reason: Provider Judgment, Comment: Per Dr. Norma Fredrickson recommendations from [facility] 1 mg/m2 (original dose 1.5 mg/m2, Cycle 1, Reason: Provider Judgment)  for chemotherapy treatment.     07/09/2017 Adverse Reaction   Diarrhea    Chemotherapy   Pomalyst 2 mg (Days 1-21 every 28 days), Ixazomib 2.3 mg (days 1, 8, 15 every 28 days), and Dexamethasone 10 mg (weekly)- Rx's printed on 08/24/2017.  Treatment recommendations from Dr. Norma Fredrickson at [facility]   07/28/2019 -  Chemotherapy   The patient had daratumumab-hyaluronidase-fihj (DARZALEX FASPRO) 1800-30000 MG-UT/15ML chemo SQ injection 1,800 mg, 1,800 mg, Subcutaneous,  Once, 1 of 10 cycles Administration: 1,800 mg (07/28/2019), 1,800 mg (08/04/2019), 1,800 mg (08/11/2019), 1,800 mg (08/25/2019)  for chemotherapy  treatment.       CANCER STAGING: Cancer Staging No matching staging information was found for the patient.   INTERVAL HISTORY:  [name] 79 y.o. female seen for follow-up of multiple myeloma.  She was recently hospitalized with infection and blood loss from ulcers.  She reports very minimal improvement in her energy levels in the last 2 weeks.  Appetite and energy levels are reported as 25%.  Occasional dizziness is present.  Denies any tingling or numbness in the extremities.  Ankle swellings have been stable.  Shortness of breath on exertion is present.  REVIEW OF SYSTEMS:  Review of Systems  Respiratory: Positive for shortness of breath.   Cardiovascular: Positive for leg swelling.  Neurological: Positive for dizziness.  All other systems reviewed and are negative.    PAST MEDICAL/SURGICAL HISTORY:  Past Medical History:  Diagnosis Date  . 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 CAD (coronary artery disease)   . COPD (chronic obstructive pulmonary disease) ([facility] [place])   . History of tobacco abuse   . Hyperlipidemia   . Hypertension   . 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 Multiple myeloma ([facility])   . Vitamin B12 deficiency 04/15/2016   Overview:  Vitamin B12 level documented 155, January 2016 with the normal range being 211-924   Past Surgical History:  Procedure Laterality Date  . BACK SURGERY    . CARDIAC CATHETERIZATION  04/2011   right and left cath showing normal right heart pressures,but newly diagnosed coronary artery disease/drug eluting stent placed to RCA with residual disease in the proximal RCA and LAD and ramus, normal LV  function and 60-65% EF  . COLONOSCOPY N/A 09/30/2014   Procedure: COLONOSCOPY;  Surgeon: Rogene Houston, MD;  [facility];  Service: Endoscopy;  Laterality: N/A;  225  . CORONARY ANGIOPLASTY WITH STENT PLACEMENT  04/2011   mid RCA: 3.0 X38 mm Promus DES. Residual 40% disease proximally  .  ESOPHAGOGASTRODUODENOSCOPY N/A 09/10/2019   Procedure: ESOPHAGOGASTRODUODENOSCOPY (EGD);  Surgeon: Clarene Essex, MD;  Location: [facility];  Service: Endoscopy;  Laterality: N/A;  . NECK SURGERY       SOCIAL HISTORY:  Social History   Socioeconomic History  . Marital status: Widowed    Spouse name: Not on file  . Number of children: Not on file  . Years of education: Not on file  . Highest education level: Not on file  Occupational History  . 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 Smoking status: Former Smoker    Packs/day: 3.00    Years: 25.00    Pack years: 75.00    Types: Cigarettes    Quit date: 07/10/1994    Years since quitting: 25.2  . Smokeless tobacco: Never Used  Substance and Sexual Activity  . Alcohol use: No  . Drug use: Never  . Sexual activity: Not on file  Other Topics Concern  . Not on file  Social History Narrative  . Not on file   Social Determinants of Health   Financial Resource Strain:   . Difficulty of Paying Living Expenses:   Food Insecurity: No Food Insecurity  . Worried About Charity fundraiser in the Last Year: Never true  . Ran Out of Food in the Last Year: Never true  Transportation Needs: No Transportation Needs  . Lack of Transportation (Medical): No  . Lack of Transportation (Non-Medical): No  Physical Activity:   . Days of Exercise per Week:   . Minutes of Exercise per Session:   Stress:   . Feeling of Stress :   Social Connections:   . Frequency of Communication with Friends and Family:   . Frequency of Social Gatherings with Friends and Family:   . Attends Religious Services:   . Active Member of Clubs or Organizations:   . Attends Archivist Meetings:   Marland Kitchen Marital Status:   Intimate Partner Violence:   . Fear of Current or Ex-Partner:   . Emotionally Abused:   Marland Kitchen Physically Abused:   . Sexually Abused:     FAMILY HISTORY:  Family History  Problem Relation Age of Onset  . Heart  failure Mother   . Cancer Father     CURRENT MEDICATIONS:  Outpatient Encounter Medications as of 10/15/2019  Medication Sig  . aspirin EC 81 MG tablet Take 1 tablet (81 mg total) by mouth daily.  Marland Kitchen atorvastatin (LIPITOR) 10 MG tablet TAKE ONE TABLET BY MOUTH DAILY  . Calcium Carbonate-Vit D-Min (CALCIUM 600+D PLUS MINERALS) 600-400 MG-UNIT CHEW Chew 1 tablet by mouth 3 (three) times daily.   . cyanocobalamin (,VITAMIN B-12,) 1000 MCG/ML injection Inject 1,000 mcg into the skin every 30 (thirty) days.   . daratumumab-hyaluronidase-fihj (DARZALEX FASPRO) 1800-30000 MG-UT/15ML SOLN Inject 1,800 mg into the skin. Weekly weeks 1-8; every 2 weeks weeks 9-24; every 4 weeks after week 24  . diltiazem (CARDIZEM CD) 240 MG 24 hr capsule Take 1 capsule (240 mg total) by mouth daily.  Marland Kitchen dronabinol (MARINOL) 10 MG capsule Take 1 capsule (10 mg total) by mouth 2 (two) times daily before  lunch and supper.  . furosemide (LASIX) 20 MG tablet Take 20 mg by mouth daily.  . magnesium oxide (MAG-OX) 400 (241.3 Mg) MG tablet Take 1 tablet by mouth 2 (two) times daily.  . metoprolol tartrate (LOPRESSOR) 25 MG tablet Take 1.5 tablets (37.5 mg total) by mouth 2 (two) times daily.  . mirtazapine (REMERON) 15 MG tablet Take 15 mg by mouth at bedtime.  . pantoprazole (PROTONIX) 40 MG tablet Take 1 tablet (40 mg total) by mouth 2 (two) times daily.  . pomalidomide (POMALYST) 2 MG capsule Take 1 capsule (2 mg total) by mouth daily. Take with water on days 1-21. Repeat every 28 days.  . potassium chloride (K-DUR) 10 MEQ tablet TAKE ONE TABLET BY MOUTH THREE TIMES DAILY.  Marland Kitchen umeclidinium-vilanterol (ANORO ELLIPTA) 62.5-25 MCG/INH AEPB Inhale 1 puff into the lungs daily.  . Zoledronic Acid 4 MG SOLR Inject into the vein.  . [DISCONTINUED] dronabinol (MARINOL) 5 MG capsule Take 1 capsule (5 mg total) by mouth 2 (two) times daily before lunch and supper.  Marland Kitchen albuterol (VENTOLIN HFA) 108 (90 Base) MCG/ACT inhaler Inhale 2 puffs  into the lungs every 6 (six) hours as needed for wheezing or shortness of breath. (Patient not taking: Reported on 10/01/2019)  . diazepam (VALIUM) 5 MG tablet TAKE ONE TABLET BY MOUTH EVERY 6 HOURS AS NEEDED FOR ANXIETY (Patient not taking: Reported on 09/01/2019)  . fluticasone (FLONASE) 50 MCG/ACT nasal spray Place 2 sprays into both nostrils as needed.   Marland Kitchen guaiFENesin (MUCINEX) 600 MG 12 hr tablet Take 600 mg 2 (two) times daily as needed by mouth for cough or to loosen phlegm.  . montelukast (SINGULAIR) 10 MG tablet Take 1 tablet three days prior to your first treatment and the day of treatment. Then take one tablet on the day of treatment there after. (Patient not taking: Reported on 10/15/2019)  . ondansetron (ZOFRAN) 8 MG tablet Take 1 tablet (8 mg total) by mouth every 8 (eight) hours as needed for nausea or vomiting. (Patient not taking: Reported on 10/01/2019)  . polyethylene glycol powder (GLYCOLAX/MIRALAX) 17 GM/SCOOP powder Take by mouth as needed.    No facility-administered encounter medications on file as of 10/15/2019.    ALLERGIES:  Allergies  Allergen Reactions  . Lisinopril Cough  . Plavix [Clopidogrel Bisulfate] Swelling     PHYSICAL EXAM:  ECOG Performance status: 1  Vitals:   10/15/19 1148 10/15/19 1151  BP: (!) 95/55 (!) 111/58  Pulse: 92   Resp: 18   Temp: (!) 97.1 F (36.2 C)   SpO2: 100%    Filed Weights   10/15/19 1148  Weight: 167 lb (75.8 kg)    Physical Exam Vitals reviewed.  Constitutional:      Appearance: Normal appearance.  Cardiovascular:     Rate and Rhythm: Normal rate and regular rhythm.     Heart sounds: Normal heart sounds.  Pulmonary:     Effort: Pulmonary effort is normal.     Breath sounds: Normal breath sounds.  Abdominal:     General: There is no distension.     Palpations: Abdomen is soft. There is no mass.  Musculoskeletal:        General: No swelling.  Skin:    General: Skin is warm.  Neurological:     General: No focal  deficit present.     Mental Status: She is alert and oriented to person, place, and time.  Psychiatric:        Mood and  Affect: Mood normal.        Behavior: Behavior normal.      LABORATORY DATA:  I have reviewed the labs as listed.  CBC    Component Value Date/Time   WBC 7.2 10/15/2019 1107   RBC 3.57 (L) 10/15/2019 1107   HGB 11.2 (L) 10/15/2019 1107   HCT 35.7 (L) 10/15/2019 1107   PLT 246 10/15/2019 1107   MCV 100.0 10/15/2019 1107   MCH 31.4 10/15/2019 1107   MCHC 31.4 10/15/2019 1107   RDW 15.6 (H) 10/15/2019 1107   LYMPHSABS 1.0 10/15/2019 1107   MONOABS 0.9 10/15/2019 1107   EOSABS 0.1 10/15/2019 1107   BASOSABS 0.0 10/15/2019 1107   CMP Latest Ref Rng & Units 10/15/2019 10/01/2019 09/23/2019  Glucose 70 - 99 mg/dL 119(H) 110(H) 104(H)  BUN 8 - 23 mg/dL '18 17 18  '$ Creatinine 0.44 - 1.00 mg/dL 1.55(H) 1.52(H) 1.29(H)  Sodium 135 - 145 mmol/L 142 142 143  Potassium 3.5 - 5.1 mmol/L 3.8 3.9 3.8  Chloride 98 - 111 mmol/L 102 104 110  CO2 22 - 32 mmol/L '28 26 24  '$ Calcium 8.9 - 10.3 mg/dL 9.6 8.6(L) 8.2(L)  Total Protein 6.5 - 8.1 g/dL 5.9(L) 5.9(L) 5.9(L)  Total Bilirubin 0.3 - 1.2 mg/dL 0.7 0.6 0.5  Alkaline Phos 38 - 126 U/L 74 82 74  AST 15 - 41 U/L '30 29 24  '$ ALT 0 - 44 U/L '20 11 15       '$ DIAGNOSTIC IMAGING:  I have independently reviewed scans.   I have reviewed Venita Lick LPN's note and agree with the documentation.  I personally performed a face-to-face visit, made revisions and my assessment and plan is as follows.    ASSESSMENT & PLAN:   Multiple myeloma ([facility]) 1.  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 -Last treatment on 08/25/2019. -She was hospitalized due to severe anemia, weakness and pneumonia and shortness of breath from 09/05/2019 through 09/15/2019.  2D echo on 09/05/2019 shows EF 55-60%. -We reviewed myeloma labs from 10/01/2019.  M spike is 0.3 g  (1.1 g previously on 06/22/2020).  Kappa light chains are 42 (629) and ratio is 1.2 (70.92). -She has gotten excellent response with limited treatment.  I have talked to her son [name] on the phone. -However she is continuing to feel very weak.  I would not start her treatment at this time.  I will reevaluate her in 2 weeks. -Obviously, she has not tolerated combination daratumumab and pomalidomide very well.  We will start them 1 at a time when she is ready.  2.  CKD: -Baseline creatinine is between 1.2-1.3.  Creatinine has been slightly elevated at 1.5 last 2 times.  She is also on Lasix.  3.  Normocytic anemia: -Hemoglobin today is 11.2.  This is from combination of CKD and myelosuppression.  She is on intermittent Aranesp and Feraheme. 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  She denies any melena or bleeding per rectum.  4.  Myeloma bone disease: -She is receiving Zometa every 3 months.  Last dose was on 07/18/2019.  She will continue calcium and vitamin D supplements.  Calcium today is 9.6.  5.  Decreased appetite: -She was taking Marinol 5 mg twice daily.  She did not see any improvement in appetite. -I will increase the dose to 10 mg twice daily.     Orders placed this encounter:  Orders Placed This  Encounter  Procedures  . Lactate dehydrogenase  . Protein electrophoresis, serum  . Kappa/lambda light chains  . CBC with Differential/Platelet  . Comprehensive metabolic panel  . Lactate dehydrogenase  . Magnesium   Total time spent is 30 minutes with more than 50% of the time was spent face-to-face discussing labs, reviewing records, treatment plan, counseling and coordination of care.   Derek Jack, MD [place] [phone]

## 2019-10-16 ENCOUNTER — Ambulatory Visit: Payer: Medicare Other | Admitting: Medical

## 2019-10-16 NOTE — Progress Notes (Deleted)
Cardiology Office Note   Date:  10/16/2019   ID:  Carol Bradley, DOB [birth year], MRN [id number]  PCP:  [name], MD  Cardiologist:  Peter Martinique, MD EP: None  No chief complaint on file.     History of Present Illness: Carol Bradley is a 79 y.o. female PMH of CAD s/p PCI/DES to RCA in 2012, HTN, HLD, mitral regurgitation, pulmHTN, COPD, CKD stage 3, and multiple myeloma on maintenance chemotherapy, who presents for follow-up of her LE edema and SOB.  She was last evaluated by cardiology at an outpatient visit with myself 07/09/2019, at which time she had complaints of SOB and LE edema which were improving with lasix '40mg'$  daily which had been recently prescribed by Coletta Memos, NP. No medication changes occurred at that time and she was recommended to follow-up in 6 months. In the interim, she was admitted to [facility] from 09/05/19-09/15/19 for a GI bleed 2/2 AVMs and gastric ulcers seen on endoscopy. Echo 09/05/19 showed EF 55-60%, mild LVH, G1DD, moderate MR, mild-moderate TR, and mild AI.Marland Kitchen Her last ischemic evaluation was a NST 12/2017 which showed no infarct or ischemia.   Past Medical History:  Diagnosis Date  . 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 CAD (coronary artery disease)   . COPD (chronic obstructive pulmonary disease) ([place])   . History of tobacco abuse   . Hyperlipidemia   . Hypertension   . 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 Multiple myeloma ([place])   . Vitamin B12 deficiency 04/15/2016   Overview:  Vitamin B12 level documented 155, January 2016 with the normal range being 211-924    Past Surgical History:  Procedure Laterality Date  . BACK SURGERY    . CARDIAC CATHETERIZATION  04/2011   right and left cath showing normal right heart pressures,but newly diagnosed coronary artery disease/drug eluting stent placed to RCA with residual disease in the proximal RCA and LAD and ramus, normal LV function and 60-65% EF  . COLONOSCOPY N/A  09/30/2014   Procedure: COLONOSCOPY;  Surgeon: Rogene Houston, MD;  [facility];  Service: Endoscopy;  Laterality: N/A;  225  . CORONARY ANGIOPLASTY WITH STENT PLACEMENT  04/2011   mid RCA: 3.0 X38 mm Promus DES. Residual 40% disease proximally  . ESOPHAGOGASTRODUODENOSCOPY N/A 09/10/2019   Procedure: ESOPHAGOGASTRODUODENOSCOPY (EGD);  Surgeon: Clarene Essex, MD;  Location: [facility];  Service: Endoscopy;  Laterality: N/A;  . NECK SURGERY       Current Outpatient Medications  Medication Sig Dispense Refill  . albuterol (VENTOLIN HFA) 108 (90 Base) MCG/ACT inhaler Inhale 2 puffs into the lungs every 6 (six) hours as needed for wheezing or shortness of breath. (Patient not taking: Reported on 10/01/2019) 18 g 2  . aspirin EC 81 MG tablet Take 1 tablet (81 mg total) by mouth daily. 90 tablet 3  . atorvastatin (LIPITOR) 10 MG tablet TAKE ONE TABLET BY MOUTH DAILY 90 tablet 3  . Calcium Carbonate-Vit D-Min (CALCIUM 600+D PLUS MINERALS) 600-400 MG-UNIT CHEW Chew 1 tablet by mouth 3 (three) times daily.     . cyanocobalamin (,VITAMIN B-12,) 1000 MCG/ML injection Inject 1,000 mcg into the skin every 30 (thirty) days.     . daratumumab-hyaluronidase-fihj (DARZALEX FASPRO) 1800-30000 MG-UT/15ML SOLN Inject 1,800 mg into the skin. Weekly weeks 1-8; every 2 weeks weeks 9-24; every 4 weeks after week 24    . diazepam (VALIUM) 5 MG tablet TAKE ONE TABLET BY MOUTH EVERY 6  HOURS AS NEEDED FOR ANXIETY (Patient not taking: Reported on 09/01/2019) 30 tablet 1  . diltiazem (CARDIZEM CD) 240 MG 24 hr capsule Take 1 capsule (240 mg total) by mouth daily. 90 capsule 3  . dronabinol (MARINOL) 10 MG capsule Take 1 capsule (10 mg total) by mouth 2 (two) times daily before lunch and supper. 60 capsule 1  . fluticasone (FLONASE) 50 MCG/ACT nasal spray Place 2 sprays into both nostrils as needed.     . furosemide (LASIX) 20 MG tablet Take 20 mg by mouth daily.    Marland Kitchen guaiFENesin (MUCINEX) 600 MG 12 hr tablet Take  600 mg 2 (two) times daily as needed by mouth for cough or to loosen phlegm.    . magnesium oxide (MAG-OX) 400 (241.3 Mg) MG tablet Take 1 tablet by mouth 2 (two) times daily.    . metoprolol tartrate (LOPRESSOR) 25 MG tablet Take 1.5 tablets (37.5 mg total) by mouth 2 (two) times daily. 75 tablet 3  . mirtazapine (REMERON) 15 MG tablet Take 15 mg by mouth at bedtime.    . montelukast (SINGULAIR) 10 MG tablet Take 1 tablet three days prior to your first treatment and the day of treatment. Then take one tablet on the day of treatment there after. (Patient not taking: Reported on 10/15/2019) 30 tablet 0  . ondansetron (ZOFRAN) 8 MG tablet Take 1 tablet (8 mg total) by mouth every 8 (eight) hours as needed for nausea or vomiting. (Patient not taking: Reported on 10/01/2019) 20 tablet 0  . pantoprazole (PROTONIX) 40 MG tablet Take 1 tablet (40 mg total) by mouth 2 (two) times daily. 60 tablet 0  . polyethylene glycol powder (GLYCOLAX/MIRALAX) 17 GM/SCOOP powder Take by mouth as needed.     . pomalidomide (POMALYST) 2 MG capsule Take 1 capsule (2 mg total) by mouth daily. Take with water on days 1-21. Repeat every 28 days. 21 capsule 0  . potassium chloride (K-DUR) 10 MEQ tablet TAKE ONE TABLET BY MOUTH THREE TIMES DAILY. 90 tablet 4  . umeclidinium-vilanterol (ANORO ELLIPTA) 62.5-25 MCG/INH AEPB Inhale 1 puff into the lungs daily. 60 each 1  . Zoledronic Acid 4 MG SOLR Inject into the vein.     No current facility-administered medications for this visit.    Allergies:   Lisinopril and Plavix [clopidogrel bisulfate]    Social History:  The patient  reports that she quit smoking about 25 years ago. Her smoking use included cigarettes. She has a 75.00 pack-year smoking history. She has never used smokeless tobacco. She reports that she does not drink alcohol or use drugs.   Family History:  The patient's ***family history includes Cancer in her father; Heart failure in her mother.    ROS:  Please see  the history of present illness.   Otherwise, review of systems are positive for {NONE DEFAULTED:18576::"none"}.   All other systems are reviewed and negative.    PHYSICAL EXAM: VS:  There were no vitals taken for this visit. , BMI There is no height or weight on file to calculate BMI. GEN: Well nourished, well developed, in no acute distress HEENT: normal Neck: no JVD, carotid bruits, or masses Cardiac: ***RRR; no murmurs, rubs, or gallops,no edema  Respiratory:  clear to auscultation bilaterally, normal work of breathing GI: soft, nontender, nondistended, + BS MS: no deformity or atrophy Skin: warm and dry, no rash Neuro:  Strength and sensation are intact Psych: euthymic mood, full affect   EKG:  EKG {ACTION; IS/IS OEU:23536144}  ordered today. The ekg ordered today demonstrates ***   Recent Labs: 09/05/2019: B Natriuretic Peptide 1,489.0 09/11/2019: Magnesium 1.9 10/15/2019: ALT 20; BUN 18; Creatinine, Ser 1.55; Hemoglobin 11.2; Platelets 246; Potassium 3.8; Sodium 142    Lipid Panel    Component Value Date/Time   CHOL 133 12/24/2017 1322   TRIG 74 12/24/2017 1322   HDL 45 12/24/2017 1322   CHOLHDL 3.0 12/24/2017 1322   VLDL 15 12/24/2017 1322   LDLCALC 73 12/24/2017 1322      Wt Readings from Last 3 Encounters:  10/15/19 167 lb (75.8 kg)  10/01/19 167 lb 6.4 oz (75.9 kg)  09/15/19 160 lb 11.5 oz (72.9 kg)      Other studies Reviewed: Additional studies/ records that were reviewed today include: ***.    ASSESSMENT AND PLAN:  1.  ***   Current medicines are reviewed at length with the patient today.  The patient {ACTIONS; HAS/DOES NOT HAVE:19233} concerns regarding medicines.  The following changes have been made:  {PLAN; NO CHANGE:13088:s}  Labs/ tests ordered today include: *** No orders of the defined types were placed in this encounter.    Disposition:   FU with *** in {gen number 3-55:217471} {Days to years:10300}  Signed, Abigail Butts, PA-C   10/16/2019 12:24 AM

## 2019-10-17 ENCOUNTER — Encounter (HOSPITAL_COMMUNITY): Payer: Self-pay | Admitting: Hematology

## 2019-10-20 ENCOUNTER — Other Ambulatory Visit (HOSPITAL_COMMUNITY): Payer: Self-pay | Admitting: *Deleted

## 2019-10-20 DIAGNOSIS — C9000 Multiple myeloma not having achieved remission: Secondary | ICD-10-CM

## 2019-10-29 ENCOUNTER — Inpatient Hospital Stay (HOSPITAL_COMMUNITY): Payer: Medicare Other

## 2019-10-29 ENCOUNTER — Other Ambulatory Visit: Payer: Self-pay

## 2019-10-29 ENCOUNTER — Inpatient Hospital Stay (HOSPITAL_BASED_OUTPATIENT_CLINIC_OR_DEPARTMENT_OTHER): Payer: Medicare Other | Admitting: Hematology

## 2019-10-29 DIAGNOSIS — C9000 Multiple myeloma not having achieved remission: Secondary | ICD-10-CM | POA: Diagnosis not present

## 2019-10-29 LAB — CBC WITH DIFFERENTIAL/PLATELET
Abs Immature Granulocytes: 0.02 10*3/uL (ref 0.00–0.07)
Basophils Absolute: 0 10*3/uL (ref 0.0–0.1)
Basophils Relative: 1 %
Eosinophils Absolute: 0.1 10*3/uL (ref 0.0–0.5)
Eosinophils Relative: 2 %
HCT: 36.4 % (ref 36.0–46.0)
Hemoglobin: 11.2 g/dL — ABNORMAL LOW (ref 12.0–15.0)
Immature Granulocytes: 0 %
Lymphocytes Relative: 16 %
Lymphs Abs: 1 10*3/uL (ref 0.7–4.0)
MCH: 31 pg (ref 26.0–34.0)
MCHC: 30.8 g/dL (ref 30.0–36.0)
MCV: 100.8 fL — ABNORMAL HIGH (ref 80.0–100.0)
Monocytes Absolute: 0.8 10*3/uL (ref 0.1–1.0)
Monocytes Relative: 13 %
Neutro Abs: 4.3 10*3/uL (ref 1.7–7.7)
Neutrophils Relative %: 68 %
Platelets: 220 10*3/uL (ref 150–400)
RBC: 3.61 MIL/uL — ABNORMAL LOW (ref 3.87–5.11)
RDW: 14.3 % (ref 11.5–15.5)
WBC: 6.2 10*3/uL (ref 4.0–10.5)
nRBC: 0 % (ref 0.0–0.2)

## 2019-10-29 LAB — COMPREHENSIVE METABOLIC PANEL
ALT: 31 U/L (ref 0–44)
AST: 33 U/L (ref 15–41)
Albumin: 3.4 g/dL — ABNORMAL LOW (ref 3.5–5.0)
Alkaline Phosphatase: 111 U/L (ref 38–126)
Anion gap: 9 (ref 5–15)
BUN: 23 mg/dL (ref 8–23)
CO2: 27 mmol/L (ref 22–32)
Calcium: 8.3 mg/dL — ABNORMAL LOW (ref 8.9–10.3)
Chloride: 105 mmol/L (ref 98–111)
Creatinine, Ser: 1.48 mg/dL — ABNORMAL HIGH (ref 0.44–1.00)
GFR calc Af Amer: 39 mL/min — ABNORMAL LOW (ref >60)
GFR calc non Af Amer: 33 mL/min — ABNORMAL LOW (ref >60)
Glucose, Bld: 116 mg/dL — ABNORMAL HIGH (ref 70–99)
Potassium: 3.8 mmol/L (ref 3.5–5.1)
Sodium: 141 mmol/L (ref 135–145)
Total Bilirubin: 0.5 mg/dL (ref 0.3–1.2)
Total Protein: 5.9 g/dL — ABNORMAL LOW (ref 6.5–8.1)

## 2019-10-29 LAB — LACTATE DEHYDROGENASE: LDH: 189 U/L (ref 98–192)

## 2019-10-29 LAB — MAGNESIUM: Magnesium: 2.3 mg/dL (ref 1.7–2.4)

## 2019-10-29 NOTE — Assessment & Plan Note (Signed)
1.  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 -Last treatment on 08/25/2019. -She was hospitalized with severe anemia, weakness and pneumonia and shortness of breath from 09/05/2019 through 09/15/2019.  2D echo on 09/05/2019 shows EF 55-60%. -Myeloma labs from 10/01/2019 reviewed by me shows M spike 0.3 g, previously 1.1 g on 06/23/2019.  Kappa light chains are 42, previously 629 and ratio is 1.2 previously 70.90. -She has gotten excellent response with limited treatment.  As she was feeling very weak, we are holding her treatment at this time. -She feels slightly better than her last visit 2 weeks ago.  She is also eating slightly better.  Myeloma panel from today is pending. -We will reevaluate her in 2 to 3 weeks.  She has not tolerated the combination of daratumumab and pomalidomide very well.  We will likely start single agent therapy. -She is having lot of burping symptoms.  She would like to see somebody locally.  She has already seen Dr.Rehman in the past.  Will make a referral back to his office.  2.  CKD: -Baseline creatinine between 1.2-1.3.  Creatinine today is 1.48.  She is taking 40 mg of Lasix. -She is complaining of ankle swellings in the evenings.  I assisted her to increase it to 40 mg in the morning and 20 mg in the afternoon.  3.  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  She denies any melena or bleeding per rectum. -Hemoglobin today has been stable at 11.2.  There is from combination of CKD and myelosuppression.  She receives intermittent Aranesp and Feraheme.  4.  Myeloma bone disease: -She is receiving Zometa every 3 months.  Last dose was on 07/18/2019. -She will continue calcium and vitamin D supplements.  5.  Decreased appetite: -I have increased her Marinol to 10 mg twice daily.  She has noticed improvement in her appetite.

## 2019-10-29 NOTE — Progress Notes (Signed)
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    03/18/2015 - 07/28/2015 Chemotherapy   Velcade 1.6 mg/m2 discontinued secondary to intolerance   07/28/2015 Adverse Reaction   stool incontinence, weakness, collapse upon standing, felt to be secondary to velcade   11/09/2015 - 12/13/2015 Chemotherapy   cytoxan IV 300 mg/m2 administered X 2 doses only in addition to rev/dex   11/09/2015 - 06/08/2016 Chemotherapy   Revlimid/Dexamethasone    02/05/2017 - 07/09/2017 Chemotherapy   The patient had bortezomib SQ (VELCADE) chemo injection 1.75 mg, 1 mg/m2 = 1.75 mg (66.7 % of original dose 1.5 mg/m2), Subcutaneous,  Once, 1 of 8 cycles Dose modification: 1.5 mg/m2 (original dose 1.5 mg/m2, Cycle 1, Reason: Provider Judgment, Comment: Per Dr. Norma Fredrickson recommendations from [facility] 1 mg/m2 (original dose 1.5 mg/m2, Cycle 1, Reason: Provider Judgment)  for chemotherapy treatment.     07/09/2017 Adverse Reaction   Diarrhea    Chemotherapy   Pomalyst 2 mg (Days 1-21 every 28 days), Ixazomib 2.3 mg (days 1, 8, 15 every 28 days), and Dexamethasone 10 mg (weekly)- Rx's printed on 08/24/2017.  Treatment recommendations from [facility] Norma Fredrickson at [facility].   07/28/2019 -  Chemotherapy   The patient had daratumumab-hyaluronidase-fihj (DARZALEX FASPRO) 1800-30000 MG-UT/15ML chemo SQ injection 1,800 mg, 1,800 mg, Subcutaneous,  Once, 1 of 10 cycles Administration: 1,800 mg (07/28/2019), 1,800 mg (08/04/2019), 1,800 mg (08/11/2019), 1,800 mg (08/25/2019)  for chemotherapy  treatment.       CANCER STAGING: Cancer Staging No matching staging information was found for the patient.   INTERVAL HISTORY:  Ms. Pettinato 79 y.o. female seen for follow-up of multiple myeloma.  Appetite and energy levels are 50%.  Reports some ankle swellings.  Also has some shortness of breath on exertion.  When she started taking Marinol 10 mg twice daily, her appetite improved in the last 2 weeks.  Weight has been stable.  Reports some burping sometimes after eating.  Denies any falls.  Denies any new onset pains.  REVIEW OF SYSTEMS:  Review of Systems  Respiratory: Positive for shortness of breath.   Cardiovascular: Positive for leg swelling.  All other systems reviewed and are negative.    PAST MEDICAL/SURGICAL HISTORY:  Past Medical History:  Diagnosis Date  . 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 CAD (coronary artery disease)   . COPD (chronic obstructive pulmonary disease) ([place])   . History of tobacco abuse   . Hyperlipidemia   . Hypertension   . 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 Multiple myeloma ([place])   . Vitamin B12 deficiency 04/15/2016   Overview:  Vitamin B12 level documented 155, January 2016 with the normal range being 211-924   Past Surgical History:  Procedure Laterality Date  . BACK SURGERY    . CARDIAC CATHETERIZATION  04/2011   right and left cath showing normal right heart pressures,but newly diagnosed coronary artery disease/drug eluting stent placed to RCA with residual disease in the proximal RCA and LAD and ramus, normal LV function and 60-65% EF  . COLONOSCOPY N/A  09/30/2014   Procedure: COLONOSCOPY;  Surgeon: Rogene Houston, MD;  Location: [facility];  Service: Endoscopy;  Laterality: N/A;  225  . CORONARY ANGIOPLASTY WITH STENT PLACEMENT  04/2011   mid RCA: 3.0 X38 mm Promus DES. Residual 40% disease proximally  . ESOPHAGOGASTRODUODENOSCOPY N/A 09/10/2019   Procedure: ESOPHAGOGASTRODUODENOSCOPY (EGD);  Surgeon:  Clarene Essex, MD;  Location: [facility];  Service: Endoscopy;  Laterality: N/A;  . NECK SURGERY       SOCIAL HISTORY:  Social History   Socioeconomic History  . Marital status: Widowed    Spouse name: Not on file  . Number of children: Not on file  . Years of education: Not on file  . Highest education level: Not on file  Occupational History  . 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 Smoking status: Former Smoker    Packs/day: 3.00    Years: 25.00    Pack years: 75.00    Types: Cigarettes    Quit date: 07/10/1994    Years since quitting: 25.3  . Smokeless tobacco: Never Used  Substance and Sexual Activity  . Alcohol use: No  . Drug use: Never  . Sexual activity: Not on file  Other Topics Concern  . Not on file  Social History Narrative  . Not on file   Social Determinants of Health   Financial Resource Strain:   . Difficulty of Paying Living Expenses:   Food Insecurity: No Food Insecurity  . Worried About Charity fundraiser in the Last Year: Never true  . Ran Out of Food in the Last Year: Never true  Transportation Needs: No Transportation Needs  . Lack of Transportation (Medical): No  . Lack of Transportation (Non-Medical): No  Physical Activity:   . Days of Exercise per Week:   . Minutes of Exercise per Session:   Stress:   . Feeling of Stress :   Social Connections:   . Frequency of Communication with Friends and Family:   . Frequency of Social Gatherings with Friends and Family:   . Attends Religious Services:   . Active Member of Clubs or Organizations:   . Attends Archivist Meetings:   Marland Kitchen Marital Status:   Intimate Partner Violence:   . Fear of Current or Ex-Partner:   . Emotionally Abused:   Marland Kitchen Physically Abused:   . Sexually Abused:     FAMILY HISTORY:  Family History  Problem Relation Age of Onset  . Heart failure Mother   . Cancer Father     CURRENT MEDICATIONS:  Outpatient Encounter Medications  as of 10/29/2019  Medication Sig  . aspirin EC 81 MG tablet Take 1 tablet (81 mg total) by mouth daily.  Marland Kitchen atorvastatin (LIPITOR) 10 MG tablet TAKE ONE TABLET BY MOUTH DAILY  . Calcium Carbonate-Vit D-Min (CALCIUM 600+D PLUS MINERALS) 600-400 MG-UNIT CHEW Chew 1 tablet by mouth 3 (three) times daily.   . cyanocobalamin (,VITAMIN B-12,) 1000 MCG/ML injection Inject 1,000 mcg into the skin every 30 (thirty) days.   . daratumumab-hyaluronidase-fihj (DARZALEX FASPRO) 1800-30000 MG-UT/15ML SOLN Inject 1,800 mg into the skin. Weekly weeks 1-8; every 2 weeks weeks 9-24; every 4 weeks after week 24  . diltiazem (CARDIZEM CD) 240 MG 24 hr capsule Take 1 capsule (240 mg total) by mouth daily.  Marland Kitchen dronabinol (MARINOL) 10 MG capsule Take 1 capsule (10 mg total) by mouth 2 (two) times daily before lunch and supper.  . furosemide (LASIX) 20  MG tablet Take 40 mg by mouth daily.   . magnesium oxide (MAG-OX) 400 (241.3 Mg) MG tablet Take 1 tablet by mouth 2 (two) times daily.  . metoprolol tartrate (LOPRESSOR) 25 MG tablet Take 1.5 tablets (37.5 mg total) by mouth 2 (two) times daily.  . mirtazapine (REMERON) 15 MG tablet Take 15 mg by mouth at bedtime.  . pantoprazole (PROTONIX) 40 MG tablet Take 1 tablet (40 mg total) by mouth 2 (two) times daily.  . polyethylene glycol powder (GLYCOLAX/MIRALAX) 17 GM/SCOOP powder Take by mouth as needed.   . pomalidomide (POMALYST) 2 MG capsule Take 1 capsule (2 mg total) by mouth daily. Take with water on days 1-21. Repeat every 28 days.  . potassium chloride (K-DUR) 10 MEQ tablet TAKE ONE TABLET BY MOUTH THREE TIMES DAILY.  Marland Kitchen umeclidinium-vilanterol (ANORO ELLIPTA) 62.5-25 MCG/INH AEPB Inhale 1 puff into the lungs daily.  . Zoledronic Acid 4 MG SOLR Inject into the vein.  Marland Kitchen albuterol (VENTOLIN HFA) 108 (90 Base) MCG/ACT inhaler Inhale 2 puffs into the lungs every 6 (six) hours as needed for wheezing or shortness of breath. (Patient not taking: Reported on 10/01/2019)  .  diazepam (VALIUM) 5 MG tablet TAKE ONE TABLET BY MOUTH EVERY 6 HOURS AS NEEDED FOR ANXIETY (Patient not taking: Reported on 09/01/2019)  . fluticasone (FLONASE) 50 MCG/ACT nasal spray Place 2 sprays into both nostrils as needed.   Marland Kitchen guaiFENesin (MUCINEX) 600 MG 12 hr tablet Take 600 mg 2 (two) times daily as needed by mouth for cough or to loosen phlegm.  . montelukast (SINGULAIR) 10 MG tablet Take 1 tablet three days prior to your first treatment and the day of treatment. Then take one tablet on the day of treatment there after. (Patient not taking: Reported on 10/15/2019)  . ondansetron (ZOFRAN) 8 MG tablet Take 1 tablet (8 mg total) by mouth every 8 (eight) hours as needed for nausea or vomiting. (Patient not taking: Reported on 10/01/2019)   No facility-administered encounter medications on file as of 10/29/2019.    ALLERGIES:  Allergies  Allergen Reactions  . Lisinopril Cough  . Plavix [Clopidogrel Bisulfate] Swelling     PHYSICAL EXAM:  ECOG Performance status: 1  Vitals:   10/29/19 1549  BP: (!) 110/55  Pulse: 92  Resp: 18  Temp: (!) 97.1 F (36.2 C)  SpO2: 95%   Filed Weights   10/29/19 1549  Weight: 167 lb (75.8 kg)    Physical Exam Vitals reviewed.  Constitutional:      Appearance: Normal appearance.  Cardiovascular:     Rate and Rhythm: Normal rate and regular rhythm.     Heart sounds: Normal heart sounds.  Pulmonary:     Effort: Pulmonary effort is normal.     Breath sounds: Normal breath sounds.  Abdominal:     General: There is no distension.     Palpations: Abdomen is soft. There is no mass.  Musculoskeletal:        General: No swelling.  Skin:    General: Skin is warm.  Neurological:     General: No focal deficit present.     Mental Status: She is alert and oriented to person, place, and time.  Psychiatric:        Mood and Affect: Mood normal.        Behavior: Behavior normal.      LABORATORY DATA:  I have reviewed the labs as listed.  CBC      Component Value Date/Time  WBC 6.2 10/29/2019 1507   RBC 3.61 (L) 10/29/2019 1507   HGB 11.2 (L) 10/29/2019 1507   HCT 36.4 10/29/2019 1507   PLT 220 10/29/2019 1507   MCV 100.8 (H) 10/29/2019 1507   MCH 31.0 10/29/2019 1507   MCHC 30.8 10/29/2019 1507   RDW 14.3 10/29/2019 1507   LYMPHSABS 1.0 10/29/2019 1507   MONOABS 0.8 10/29/2019 1507   EOSABS 0.1 10/29/2019 1507   BASOSABS 0.0 10/29/2019 1507   CMP Latest Ref Rng & Units 10/29/2019 10/15/2019 10/01/2019  Glucose 70 - 99 mg/dL 116(H) 119(H) 110(H)  BUN 8 - 23 mg/dL _0 Creatinine 0.44 - 1.00 mg/dL 1.48(H) 1.55(H) 1.52(H)  Sodium 135 - 145 mmol/L 141 142 142  Potassium 3.5 - 5.1 mmol/L 3.8 3.8 3.9  Chloride 98 - 111 mmol/L 105 102 104  CO2 22 - 32 mmol/L _1 Calcium 8.9 - 10.3 mg/dL 8.3(L) 9.6 8.6(L)  Total Protein 6.5 - 8.1 g/dL 5.9(L) 5.9(L) 5.9(L)  Total Bilirubin 0.3 - 1.2 mg/dL 0.5 0.7 0.6  Alkaline Phos 38 - 126 U/L 111 74 82  AST 15 - 41 U/L 33 30 29  ALT 0 - 44 U/L _2 DIAGNOSTIC IMAGING:  I have independently reviewed scans.   I have reviewed Venita Lick LPN's note and agree with the documentation.  I personally performed a face-to-face visit, made revisions and my assessment and plan is as follows.    ASSESSMENT & PLAN:   Multiple myeloma (HCC) 1.  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 -Last treatment on 08/25/2019. -She was hospitalized with severe anemia, weakness and pneumonia and shortness of breath from 09/05/2019 through 09/15/2019.  2D echo on 09/05/2019 shows EF 55-60%. -Myeloma labs from 10/01/2019 reviewed by me shows M spike 0.3 g, previously 1.1 g on 06/23/2019.  Kappa light chains are 42, previously 629 and ratio is 1.2 previously 70.90. -She has gotten excellent response with limited treatment.  As she was feeling very weak, we are holding her treatment at this time. -She feels  slightly better than her last visit 2 weeks ago.  She is also eating slightly better.  Myeloma panel from today is pending. -We will reevaluate her in 2 to 3 weeks.  She has not tolerated the combination of daratumumab and pomalidomide very well.  We will likely start single agent therapy. -She is having lot of burping symptoms.  She would like to see somebody locally.  She has already seen Dr.Rehman in the past.  Will make a referral back to his office.  2.  CKD: -Baseline creatinine between 1.2-1.3.  Creatinine today is 1.48.  She is taking 40 mg of Lasix. -She is complaining of ankle swellings in the evenings.  I assisted her to increase it to 40 mg in the morning and 20 mg in the afternoon.  3.  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  She denies any melena or bleeding per rectum. -Hemoglobin today has been stable at 11.2.  There is from combination of CKD and myelosuppression.  She receives intermittent Aranesp and Feraheme.  4.  Myeloma bone disease: -She is receiving Zometa every 3 months.  Last dose was on 07/18/2019. -She will continue calcium and vitamin D supplements.  5.  Decreased appetite: -I have increased her Marinol to 10 mg twice daily.  She has noticed improvement in  her appetite.  I have also talked to her son [name] on the phone while with the patient.  Orders placed this encounter:  No orders of the defined types were placed in this encounter.     Derek Jack, MD [place] [phone]

## 2019-10-29 NOTE — Patient Instructions (Addendum)
[place] at [facility] Discharge Instructions  You were seen today by Dr. Delton Coombes. He went over your recent lab results. Start taking an additional half tablet of Lasix in the afternoon with the swelling in your lower legs. He will see you back in 3 weeks for labs and follow up.   Thank you for choosing [place] at [facility] to provide your oncology and hematology care.  To afford each patient quality time with our provider, please arrive at least 15 minutes before your scheduled appointment time.   If you have a lab appointment with the [facility] please come in thru the  Main Entrance and check in at the main information desk  You need to re-schedule your appointment should you arrive 10 or more minutes late.  We strive to give you quality time with our providers, and arriving late affects you and other patients whose appointments are after yours.  Also, if you no show three or more times for appointments you may be dismissed from the clinic at the providers discretion.     Again, thank you for choosing [facility].  Our hope is that these requests will decrease the amount of time that you wait before being seen by our physicians.       _____________________________________________________________  Should you have questions after your visit to [facility], please contact our office at [phone] between the hours of 8:00 a.m. and 4:30 p.m.  Voicemails left after 4:00 p.m. will not be returned until the following business day.  For prescription refill requests, have your pharmacy contact our office and allow 72 hours.    Cancer Center Support Programs:   > Cancer Support Group  2nd Tuesday of the month 1pm-2pm, Journey Room

## 2019-10-30 LAB — PROTEIN ELECTROPHORESIS, SERUM
A/G Ratio: 1.3 (ref 0.7–1.7)
Albumin ELP: 3 g/dL (ref 2.9–4.4)
Alpha-1-Globulin: 0.2 g/dL (ref 0.0–0.4)
Alpha-2-Globulin: 0.8 g/dL (ref 0.4–1.0)
Beta Globulin: 0.8 g/dL (ref 0.7–1.3)
Gamma Globulin: 0.5 g/dL (ref 0.4–1.8)
Globulin, Total: 2.3 g/dL (ref 2.2–3.9)
M-Spike, %: 0.2 g/dL — ABNORMAL HIGH
Total Protein ELP: 5.3 g/dL — ABNORMAL LOW (ref 6.0–8.5)

## 2019-10-30 LAB — KAPPA/LAMBDA LIGHT CHAINS
Kappa free light chain: 62.4 mg/L — ABNORMAL HIGH (ref 3.3–19.4)
Kappa, lambda light chain ratio: 3.3 — ABNORMAL HIGH (ref 0.26–1.65)
Lambda free light chains: 18.9 mg/L (ref 5.7–26.3)

## 2019-11-02 NOTE — Progress Notes (Signed)
Cardiology Office Note   Date:  11/05/2019   ID:  Carol Bradley, DOB 05-10-1941, MRN [id number]  PCP:  [name], MD  Cardiologist:  Peter Martinique, MD EP: None  Chief Complaint  Patient presents with  . Follow-up      History of Present Illness: Carol Bradley is a 79 y.o. female  with PMH of CAD s/p PCI/DES to RCA in 5631, chronic diastolic CHF, HTN, HLD, mitral regurgitation, pulmHTN, COPD, CKD stage 3, and multiple myeloma on maintenance chemotherapy, who presents for follow-up of her CHF  She was last evaluated by cardiology at an outpatient visit with myself 07/09/19, at which time she had a recent episode of acute on chronic diastolic CHF with increased lasix for a brief time. She was recommended to continue lasix '40mg'$  daily with plans to take an additional lasix for weight gain of 3lbs in 1 day or 5lbs in 1 week. No other medication changes occurred and she was recommended to follow-up in 6 months with Dr. Martinique.   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 He hospital course was further complicated by acute on chronic diastolic CHF requiring IV lasix, ultimately discharged home on lasix '20mg'$  daily ,as well as community acquired PNA managed with IV antibiotics. Echo that admission showed EF 55-60%, mild concentric LVH, G1DD, no RWMA, moderate MR, mild-moderate TR, and mild AI. Her last ischemic evaluation was a NST 12/2017 which showed no infarct or ischemia.   She presents today for post-hospital follow-up. She is slowly building her energy up since her hospitalization in March. She has chronic DOE which is unchanged. She continues to have LE edema which overall is well controlled on lasix '40mg'$  daily. We discussed the nature of her heart failure and importance of limiting salt intake and weighing herself daily as a way to monitor her volume status at home. She has rare palpitations  and lightheaded spells without syncope which are chronic and unchanged. She denies chest pain, orthopnea, or PND.     Past Medical History:  Diagnosis Date  . 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 CAD (coronary artery disease)   . COPD (chronic obstructive pulmonary disease) ([place])   . History of tobacco abuse   . Hyperlipidemia   . Hypertension   . 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 Multiple myeloma ([place])   . Vitamin B12 deficiency 04/15/2016   Overview:  Vitamin B12 level documented 155, January 2016 with the normal range being 211-924    Past Surgical History:  Procedure Laterality Date  . BACK SURGERY    . CARDIAC CATHETERIZATION  04/2011   right and left cath showing normal right heart pressures,but newly diagnosed coronary artery disease/drug eluting stent placed to RCA with residual disease in the proximal RCA and LAD and ramus, normal LV function and 60-65% EF  . COLONOSCOPY N/A 09/30/2014   Procedure: COLONOSCOPY;  Surgeon: Rogene Houston, MD;  Location: [facility];  Service: Endoscopy;  Laterality: N/A;  225  . CORONARY ANGIOPLASTY WITH STENT PLACEMENT  04/2011   mid RCA: 3.0 X38 mm Promus DES. Residual 40% disease proximally  . ESOPHAGOGASTRODUODENOSCOPY N/A 09/10/2019   Procedure: ESOPHAGOGASTRODUODENOSCOPY (EGD);  Surgeon: Clarene Essex, MD;  Location: [facility];  Service: Endoscopy;  Laterality: N/A;  . NECK SURGERY       Current Outpatient Medications  Medication Sig  Dispense Refill  . albuterol (VENTOLIN HFA) 108 (90 Base) MCG/ACT inhaler Inhale 2 puffs into the lungs every 6 (six) hours as needed for wheezing or shortness of breath. 18 g 2  . aspirin EC 81 MG tablet Take 1 tablet (81 mg total) by mouth daily. 90 tablet 3  . atorvastatin (LIPITOR) 10 MG tablet TAKE ONE TABLET BY MOUTH DAILY 90 tablet 3  . Calcium Carbonate-Vit D-Min (CALCIUM 600+D PLUS MINERALS) 600-400 MG-UNIT CHEW Chew 1 tablet by mouth 3 (three) times daily.      . cyanocobalamin (,VITAMIN B-12,) 1000 MCG/ML injection Inject 1,000 mcg into the skin every 30 (thirty) days.     . diazepam (VALIUM) 5 MG tablet TAKE ONE TABLET BY MOUTH EVERY 6 HOURS AS NEEDED FOR ANXIETY 30 tablet 1  . diltiazem (CARDIZEM CD) 240 MG 24 hr capsule Take 1 capsule (240 mg total) by mouth daily. 90 capsule 3  . dronabinol (MARINOL) 10 MG capsule Take 1 capsule (10 mg total) by mouth 2 (two) times daily before lunch and supper. 60 capsule 1  . fluticasone (FLONASE) 50 MCG/ACT nasal spray Place 2 sprays into both nostrils as needed.     . furosemide (LASIX) 20 MG tablet Take 40 mg by mouth daily.     Marland Kitchen guaiFENesin (MUCINEX) 600 MG 12 hr tablet Take 600 mg 2 (two) times daily as needed by mouth for cough or to loosen phlegm.    . magnesium oxide (MAG-OX) 400 (241.3 Mg) MG tablet Take 1 tablet by mouth 2 (two) times daily.    . metoprolol tartrate (LOPRESSOR) 25 MG tablet Take 1.5 tablets (37.5 mg total) by mouth 2 (two) times daily. 75 tablet 3  . montelukast (SINGULAIR) 10 MG tablet Take 1 tablet three days prior to your first treatment and the day of treatment. Then take one tablet on the day of treatment there after. 30 tablet 0  . ondansetron (ZOFRAN) 8 MG tablet Take 1 tablet (8 mg total) by mouth every 8 (eight) hours as needed for nausea or vomiting. 20 tablet 0  . pantoprazole (PROTONIX) 40 MG tablet Take 1 tablet (40 mg total) by mouth 2 (two) times daily. 60 tablet 0  . polyethylene glycol powder (GLYCOLAX/MIRALAX) 17 GM/SCOOP powder Take by mouth as needed.     . potassium chloride (K-DUR) 10 MEQ tablet TAKE ONE TABLET BY MOUTH THREE TIMES DAILY. 90 tablet 4  . umeclidinium-vilanterol (ANORO ELLIPTA) 62.5-25 MCG/INH AEPB Inhale 1 puff into the lungs daily. 60 each 1   No current facility-administered medications for this visit.    Allergies:   Lisinopril and Plavix [clopidogrel bisulfate]    Social History:  The patient  reports that she quit smoking about 25 years  ago. Her smoking use included cigarettes. She has a 75.00 pack-year smoking history. She has never used smokeless tobacco. She reports that she does not drink alcohol or use drugs.   Family History:  The patient's family history includes Cancer in her father; Heart failure in her mother.    ROS:  Please see the history of present illness.   Otherwise, review of systems are positive for none.   All other systems are reviewed and negative.    PHYSICAL EXAM: VS:  BP (!) 112/58   Pulse 82   Temp (!) 96.3 F (35.7 C) Comment: Forehead  Ht '5\' 7"'$  (1.702 m)   Wt 169 lb 12.8 oz (77 kg)   SpO2 99%   BMI 26.59 kg/m  , BMI  Body mass index is 26.59 kg/m. GEN: Well nourished, well developed, in no acute distress HEENT: sclera anicteric Neck: no JVD, carotid bruits, or masses Cardiac: RRR; no murmurs, rubs, or gallops, trace LE edema  Respiratory:  clear to auscultation bilaterally, normal work of breathing GI: soft, nontender, nondistended, + BS MS: no deformity or atrophy Skin: warm and dry, no rash Neuro:  Strength and sensation are intact Psych: euthymic mood, full affect   EKG:  EKG is not ordered today.   Recent Labs: 09/05/2019: B Natriuretic Peptide 1,489.0 10/29/2019: ALT 31; BUN 23; Creatinine, Ser 1.48; Hemoglobin 11.2; Magnesium 2.3; Platelets 220; Potassium 3.8; Sodium 141    Lipid Panel    Component Value Date/Time   CHOL 133 12/24/2017 1322   TRIG 74 12/24/2017 1322   HDL 45 12/24/2017 1322   CHOLHDL 3.0 12/24/2017 1322   VLDL 15 12/24/2017 1322   LDLCALC 73 12/24/2017 1322      Wt Readings from Last 3 Encounters:  11/03/19 169 lb 12.8 oz (77 kg)  10/29/19 167 lb (75.8 kg)  10/15/19 167 lb (75.8 kg)      Other studies Reviewed: Additional studies/ records that were reviewed today include:   Echocardiogram 08/2019: 1. Left ventricular ejection fraction, by estimation, is 55 to 60%. The  left ventricle has normal function. The left ventricle has no regional    wall motion abnormalities. There is mild concentric left ventricular  hypertrophy. Left ventricular diastolic  parameters are consistent with Grade I diastolic dysfunction (impaired  relaxation).  2. Right ventricular systolic function is normal. The right ventricular  size is mildly enlarged. There is mildly elevated pulmonary artery  systolic pressure.  3. Right atrial size was mildly dilated.  4. The mitral valve is degenerative. Moderate mitral valve regurgitation.  5. Tricuspid valve regurgitation is mild to moderate.  6. The aortic valve is tricuspid. Aortic valve regurgitation is mild.  Mild aortic valve sclerosis is present, with no evidence of aortic valve  stenosis.  7. The inferior vena cava is normal in size with greater than 50%  respiratory variability, suggesting right atrial pressure of 3 mmHg.   Echocardiogram 01/2019: 1. The left ventricle has normal systolic function with an ejection fraction of 60-65%. The cavity size was normal. There is moderately increased left ventricular wall thickness. Left ventricular diastolic Doppler parameters are consistent with impaired relaxation. 2. Left atrial size was mildly dilated. 3. The mitral valve is abnormal. Mild calcification of the mitral valve leaflet. There is mild mitral annular calcification present. Mitral valve regurgitation is mild to moderate by color flow Doppler. 4. The aortic valve is tricuspid. Aortic valve regurgitation is trivial by color flow Doppler. No stenosis of the aortic valve. 5. Normal LV systolic function; mild diastolic dysfunction; moderate LVH; sclerotic aortic valve with trace AI; mild to moderate MR; mild LAE.  NST 12/2017:  Nuclear stress EF: 72%. No wall motion abnormalities  No evidence of significant perfusion defects. No TID (transient ischemic dilatation). No infarct or ischemia identified. During Lexiscan infusion, diffuse mild, 1 mm ST segment depression noted.  This is a low  risk study. There is not appear to be any ischemia identified. No ischemia in the previously placed RCA stent territory.    ASSESSMENT AND PLAN:  1. Chronic diastolic CHF: Overall volume status appears stable - Will continue lasix '40mg'$  daily for now with 30 mEq K - Continue metoprolol - Encouraged weights daily with plans to take additional '20mg'$  of lasix if weight gain  of 3lbs in 1 day or 5lbs in 1 week.  - Continue to limit salt intake  2. CAD s/p PCI/DES to RCA in 2012: no complaints of chest pain - Continue aspirin, statin, and metoprolol  3. HTN: BP 112/58 today.  - Continue metoprolol and diltiazem  4. HLD: LDL 73 12/2017 - Due for FLP - will have her come back at her convenience for fasting labs - Continue low dose atorvastatin  5. CKD stage 3: Cr 1.48 10/29/19 (baseline 1.2-1.3) - Continue to monitor routinely  6. Multiple Myeloma: anticipating new therapy in the coming weeks.  - Continue management per Oncology  7. Anemia: Hgb stable at 11.2. No overt bleeding.  - Continue to monitor closely per oncology.      Current medicines are reviewed at length with the patient today.  The patient does not have concerns regarding medicines.  The following changes have been made:  As above  Labs/ tests ordered today include:  No orders of the defined types were placed in this encounter.    Disposition:   FU with Dr. Martinique in 6 months  Signed, Abigail Butts, PA-C  11/05/2019 12:19 AM

## 2019-11-03 ENCOUNTER — Ambulatory Visit (INDEPENDENT_AMBULATORY_CARE_PROVIDER_SITE_OTHER): Payer: Medicare Other | Admitting: Medical

## 2019-11-03 ENCOUNTER — Other Ambulatory Visit: Payer: Self-pay

## 2019-11-03 ENCOUNTER — Encounter: Payer: Self-pay | Admitting: Medical

## 2019-11-03 VITALS — BP 112/58 | HR 82 | Temp 96.3°F | Ht 67.0 in | Wt 169.8 lb

## 2019-11-03 DIAGNOSIS — N183 Chronic kidney disease, stage 3 unspecified: Secondary | ICD-10-CM

## 2019-11-03 DIAGNOSIS — D649 Anemia, unspecified: Secondary | ICD-10-CM

## 2019-11-03 DIAGNOSIS — I5032 Chronic diastolic (congestive) heart failure: Secondary | ICD-10-CM | POA: Diagnosis not present

## 2019-11-03 DIAGNOSIS — I10 Essential (primary) hypertension: Secondary | ICD-10-CM | POA: Diagnosis not present

## 2019-11-03 DIAGNOSIS — C9000 Multiple myeloma not having achieved remission: Secondary | ICD-10-CM

## 2019-11-03 DIAGNOSIS — I2510 Atherosclerotic heart disease of native coronary artery without angina pectoris: Secondary | ICD-10-CM | POA: Diagnosis not present

## 2019-11-03 DIAGNOSIS — E785 Hyperlipidemia, unspecified: Secondary | ICD-10-CM | POA: Diagnosis not present

## 2019-11-03 NOTE — Patient Instructions (Addendum)
Medication Instructions:   May take an additional 20 mg of Lasix for weight gain of 3 lbs overnight and 5 lbs in a week  *If you need a refill on your cardiac medications before your next appointment, please call your pharmacy*  Lab Work: NONE ordered at this time of appointment   If you have labs (blood work) drawn today and your tests are completely normal, you will receive your results only by: Marland Kitchen MyChart Message (if you have MyChart) OR . A paper copy in the mail If you have any lab test that is abnormal or we need to change your treatment, we will call you to review the results.  Testing/Procedures: NONE ordered at this time of appointment   Follow-Up: At [facility], you and your health needs are our priority.  As part of our continuing mission to provide you with exceptional heart care, we have created designated Provider Care Teams.  These Care Teams include your primary Cardiologist (physician) and Advanced Practice Providers (APPs -  Physician Assistants and Nurse Practitioners) who all work together to provide you with the care you need, when you need it.  Your next appointment:   6 month(s)  The format for your next appointment:   In Person  Provider:   Peter Martinique, MD  Other Instructions  Read the information given at today's appointment on [facility] [place]  Weigh yourself daily

## 2019-11-04 ENCOUNTER — Other Ambulatory Visit (HOSPITAL_COMMUNITY): Payer: Self-pay | Admitting: *Deleted

## 2019-11-04 DIAGNOSIS — C9000 Multiple myeloma not having achieved remission: Secondary | ICD-10-CM

## 2019-11-04 NOTE — Progress Notes (Signed)
Orders placed for h. Pylori per Dr. Delton Coombes. Patient is aware of the need to reschedule her lab appt.

## 2019-11-05 ENCOUNTER — Inpatient Hospital Stay (HOSPITAL_COMMUNITY): Payer: Medicare Other

## 2019-11-06 ENCOUNTER — Inpatient Hospital Stay (HOSPITAL_COMMUNITY): Payer: Medicare Other

## 2019-11-06 ENCOUNTER — Other Ambulatory Visit: Payer: Self-pay

## 2019-11-06 DIAGNOSIS — C9000 Multiple myeloma not having achieved remission: Secondary | ICD-10-CM | POA: Diagnosis not present

## 2019-11-06 LAB — COMPREHENSIVE METABOLIC PANEL
ALT: 31 U/L (ref 0–44)
AST: 43 U/L — ABNORMAL HIGH (ref 15–41)
Albumin: 3.6 g/dL (ref 3.5–5.0)
Alkaline Phosphatase: 88 U/L (ref 38–126)
Anion gap: 11 (ref 5–15)
BUN: 16 mg/dL (ref 8–23)
CO2: 23 mmol/L (ref 22–32)
Calcium: 8.7 mg/dL — ABNORMAL LOW (ref 8.9–10.3)
Chloride: 104 mmol/L (ref 98–111)
Creatinine, Ser: 1.43 mg/dL — ABNORMAL HIGH (ref 0.44–1.00)
GFR calc Af Amer: 40 mL/min — ABNORMAL LOW (ref >60)
GFR calc non Af Amer: 35 mL/min — ABNORMAL LOW (ref >60)
Glucose, Bld: 129 mg/dL — ABNORMAL HIGH (ref 70–99)
Potassium: 3.8 mmol/L (ref 3.5–5.1)
Sodium: 138 mmol/L (ref 135–145)
Total Bilirubin: 0.7 mg/dL (ref 0.3–1.2)
Total Protein: 6.1 g/dL — ABNORMAL LOW (ref 6.5–8.1)

## 2019-11-06 LAB — LACTATE DEHYDROGENASE: LDH: 184 U/L (ref 98–192)

## 2019-11-06 LAB — CBC WITH DIFFERENTIAL/PLATELET
Abs Immature Granulocytes: 0.01 10*3/uL (ref 0.00–0.07)
Basophils Absolute: 0 10*3/uL (ref 0.0–0.1)
Basophils Relative: 0 %
Eosinophils Absolute: 0 10*3/uL (ref 0.0–0.5)
Eosinophils Relative: 1 %
HCT: 37 % (ref 36.0–46.0)
Hemoglobin: 11.7 g/dL — ABNORMAL LOW (ref 12.0–15.0)
Immature Granulocytes: 0 %
Lymphocytes Relative: 11 %
Lymphs Abs: 0.6 10*3/uL — ABNORMAL LOW (ref 0.7–4.0)
MCH: 31.4 pg (ref 26.0–34.0)
MCHC: 31.6 g/dL (ref 30.0–36.0)
MCV: 99.2 fL (ref 80.0–100.0)
Monocytes Absolute: 1 10*3/uL (ref 0.1–1.0)
Monocytes Relative: 17 %
Neutro Abs: 4.4 10*3/uL (ref 1.7–7.7)
Neutrophils Relative %: 71 %
Platelets: 167 10*3/uL (ref 150–400)
RBC: 3.73 MIL/uL — ABNORMAL LOW (ref 3.87–5.11)
RDW: 13.9 % (ref 11.5–15.5)
WBC: 6.1 10*3/uL (ref 4.0–10.5)
nRBC: 0 % (ref 0.0–0.2)

## 2019-11-07 LAB — PROTEIN ELECTROPHORESIS, SERUM
A/G Ratio: 1.3 (ref 0.7–1.7)
Albumin ELP: 3.3 g/dL (ref 2.9–4.4)
Alpha-1-Globulin: 0.3 g/dL (ref 0.0–0.4)
Alpha-2-Globulin: 0.8 g/dL (ref 0.4–1.0)
Beta Globulin: 0.8 g/dL (ref 0.7–1.3)
Gamma Globulin: 0.6 g/dL (ref 0.4–1.8)
Globulin, Total: 2.5 g/dL (ref 2.2–3.9)
M-Spike, %: 0.3 g/dL — ABNORMAL HIGH
Total Protein ELP: 5.8 g/dL — ABNORMAL LOW (ref 6.0–8.5)

## 2019-11-07 LAB — KAPPA/LAMBDA LIGHT CHAINS
Kappa free light chain: 57.7 mg/L — ABNORMAL HIGH (ref 3.3–19.4)
Kappa, lambda light chain ratio: 4.01 — ABNORMAL HIGH (ref 0.26–1.65)
Lambda free light chains: 14.4 mg/L (ref 5.7–26.3)

## 2019-11-10 ENCOUNTER — Inpatient Hospital Stay (HOSPITAL_COMMUNITY): Payer: Medicare Other | Admitting: Hematology

## 2019-11-13 ENCOUNTER — Inpatient Hospital Stay (HOSPITAL_COMMUNITY): Payer: Medicare Other | Attending: Hematology

## 2019-11-13 ENCOUNTER — Inpatient Hospital Stay (HOSPITAL_COMMUNITY): Payer: Medicare Other | Attending: Hematology | Admitting: Hematology

## 2019-11-13 ENCOUNTER — Other Ambulatory Visit: Payer: Self-pay

## 2019-11-13 DIAGNOSIS — R0602 Shortness of breath: Secondary | ICD-10-CM | POA: Insufficient documentation

## 2019-11-13 DIAGNOSIS — R112 Nausea with vomiting, unspecified: Secondary | ICD-10-CM | POA: Diagnosis not present

## 2019-11-13 DIAGNOSIS — J449 Chronic obstructive pulmonary disease, unspecified: Secondary | ICD-10-CM | POA: Diagnosis not present

## 2019-11-13 DIAGNOSIS — Z9221 Personal history of antineoplastic chemotherapy: Secondary | ICD-10-CM | POA: Insufficient documentation

## 2019-11-13 DIAGNOSIS — I2510 Atherosclerotic heart disease of native coronary artery without angina pectoris: Secondary | ICD-10-CM | POA: Insufficient documentation

## 2019-11-13 DIAGNOSIS — C9000 Multiple myeloma not having achieved remission: Secondary | ICD-10-CM | POA: Insufficient documentation

## 2019-11-13 DIAGNOSIS — R200 Anesthesia of skin: Secondary | ICD-10-CM | POA: Diagnosis not present

## 2019-11-13 DIAGNOSIS — Z79899 Other long term (current) drug therapy: Secondary | ICD-10-CM | POA: Diagnosis not present

## 2019-11-13 DIAGNOSIS — Z87891 Personal history of nicotine dependence: Secondary | ICD-10-CM | POA: Insufficient documentation

## 2019-11-13 DIAGNOSIS — D631 Anemia in chronic kidney disease: Secondary | ICD-10-CM | POA: Insufficient documentation

## 2019-11-13 DIAGNOSIS — I129 Hypertensive chronic kidney disease with stage 1 through stage 4 chronic kidney disease, or unspecified chronic kidney disease: Secondary | ICD-10-CM | POA: Diagnosis not present

## 2019-11-13 DIAGNOSIS — E785 Hyperlipidemia, unspecified: Secondary | ICD-10-CM | POA: Insufficient documentation

## 2019-11-13 DIAGNOSIS — R600 Localized edema: Secondary | ICD-10-CM | POA: Diagnosis not present

## 2019-11-13 DIAGNOSIS — Z7982 Long term (current) use of aspirin: Secondary | ICD-10-CM | POA: Diagnosis not present

## 2019-11-13 LAB — LACTATE DEHYDROGENASE: LDH: 176 U/L (ref 98–192)

## 2019-11-13 LAB — COMPREHENSIVE METABOLIC PANEL
ALT: 62 U/L — ABNORMAL HIGH (ref 0–44)
AST: 52 U/L — ABNORMAL HIGH (ref 15–41)
Albumin: 3.3 g/dL — ABNORMAL LOW (ref 3.5–5.0)
Alkaline Phosphatase: 193 U/L — ABNORMAL HIGH (ref 38–126)
Anion gap: 10 (ref 5–15)
BUN: 15 mg/dL (ref 8–23)
CO2: 25 mmol/L (ref 22–32)
Calcium: 8.8 mg/dL — ABNORMAL LOW (ref 8.9–10.3)
Chloride: 105 mmol/L (ref 98–111)
Creatinine, Ser: 1.24 mg/dL — ABNORMAL HIGH (ref 0.44–1.00)
GFR calc Af Amer: 48 mL/min — ABNORMAL LOW (ref >60)
GFR calc non Af Amer: 41 mL/min — ABNORMAL LOW (ref >60)
Glucose, Bld: 110 mg/dL — ABNORMAL HIGH (ref 70–99)
Potassium: 3.7 mmol/L (ref 3.5–5.1)
Sodium: 140 mmol/L (ref 135–145)
Total Bilirubin: 0.5 mg/dL (ref 0.3–1.2)
Total Protein: 6.3 g/dL — ABNORMAL LOW (ref 6.5–8.1)

## 2019-11-13 LAB — CBC WITH DIFFERENTIAL/PLATELET
Abs Immature Granulocytes: 0.03 10*3/uL (ref 0.00–0.07)
Basophils Absolute: 0 10*3/uL (ref 0.0–0.1)
Basophils Relative: 0 %
Eosinophils Absolute: 0.1 10*3/uL (ref 0.0–0.5)
Eosinophils Relative: 1 %
HCT: 37.6 % (ref 36.0–46.0)
Hemoglobin: 11.7 g/dL — ABNORMAL LOW (ref 12.0–15.0)
Immature Granulocytes: 1 %
Lymphocytes Relative: 13 %
Lymphs Abs: 0.8 10*3/uL (ref 0.7–4.0)
MCH: 30.5 pg (ref 26.0–34.0)
MCHC: 31.1 g/dL (ref 30.0–36.0)
MCV: 98.2 fL (ref 80.0–100.0)
Monocytes Absolute: 1.2 10*3/uL — ABNORMAL HIGH (ref 0.1–1.0)
Monocytes Relative: 18 %
Neutro Abs: 4.4 10*3/uL (ref 1.7–7.7)
Neutrophils Relative %: 67 %
Platelets: 188 10*3/uL (ref 150–400)
RBC: 3.83 MIL/uL — ABNORMAL LOW (ref 3.87–5.11)
RDW: 13.4 % (ref 11.5–15.5)
WBC: 6.5 10*3/uL (ref 4.0–10.5)
nRBC: 0 % (ref 0.0–0.2)

## 2019-11-13 NOTE — Progress Notes (Signed)
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    03/18/2015 - 07/28/2015 Chemotherapy   Velcade 1.6 mg/m2 discontinued secondary to intolerance   07/28/2015 Adverse Reaction   stool incontinence, weakness, collapse upon standing, felt to be secondary to velcade   11/09/2015 - 12/13/2015 Chemotherapy   cytoxan IV 300 mg/m2 administered X 2 doses only in addition to rev/dex   11/09/2015 - 06/08/2016 Chemotherapy   Revlimid/Dexamethasone    02/05/2017 - 07/09/2017 Chemotherapy   The patient had bortezomib SQ (VELCADE) chemo injection 1.75 mg, 1 mg/m2 = 1.75 mg (66.7 % of original dose 1.5 mg/m2), Subcutaneous,  Once, 1 of 8 cycles Dose modification: 1.5 mg/m2 (original dose 1.5 mg/m2, Cycle 1, Reason: Provider Judgment, Comment: Per Dr. Norma Fredrickson recommendations from [facility] 1 mg/m2 (original dose 1.5 mg/m2, Cycle 1, Reason: Provider Judgment)  for chemotherapy treatment.     07/09/2017 Adverse Reaction   Diarrhea    Chemotherapy   Pomalyst 2 mg (Days 1-21 every 28 days), Ixazomib 2.3 mg (days 1, 8, 15 every 28 days), and Dexamethasone 10 mg (weekly)- Rx's printed on 08/24/2017.  Treatment recommendations from Dr. Norma Fredrickson at [facility].   07/28/2019 -  Chemotherapy   The patient had daratumumab-hyaluronidase-fihj (DARZALEX FASPRO) 1800-30000 MG-UT/15ML chemo SQ injection 1,800 mg, 1,800 mg, Subcutaneous,  Once, 1 of 10 cycles Administration: 1,800 mg (07/28/2019), 1,800 mg (08/04/2019), 1,800 mg (08/11/2019), 1,800 mg (08/25/2019)  for chemotherapy  treatment.       CANCER STAGING: Cancer Staging No matching staging information was found for the patient.   INTERVAL HISTORY:  Ms. Remick 79 y.o. female seen for follow-up of multiple myeloma.  She lost 4 pounds.  She is drinking 2 ensures per day.  She is eating only 1 meal per day.  Reports right ear pain.  She has not been able to tolerate Marinol because of nausea.  Reports no appetite.  Energy levels are 25%.  Denies any fevers or chills.  No bleeding per rectum or melena.  REVIEW OF SYSTEMS:  Review of Systems  Respiratory: Positive for shortness of breath.   Cardiovascular: Positive for leg swelling.  Gastrointestinal: Positive for nausea and vomiting.  Neurological: Positive for numbness.  All other systems reviewed and are negative.    PAST MEDICAL/SURGICAL HISTORY:  Past Medical History:  Diagnosis Date  . 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 CAD (coronary artery disease)   . COPD (chronic obstructive pulmonary disease) ([place])   . History of tobacco abuse   . Hyperlipidemia   . Hypertension   . 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 Multiple myeloma ([place])   . Vitamin B12 deficiency 04/15/2016   Overview:  Vitamin B12 level documented 155, January 2016 with the normal range being 211-924   Past Surgical History:  Procedure Laterality Date  . BACK SURGERY    . CARDIAC CATHETERIZATION  04/2011   right and left cath showing normal right heart pressures,but newly diagnosed coronary artery disease/drug eluting stent placed to RCA with residual disease in the proximal RCA and LAD  and ramus, normal LV function and 60-65% EF  . COLONOSCOPY N/A 09/30/2014   Procedure: COLONOSCOPY;  Surgeon: Rogene Houston, MD;  Location: [facility];  Service: Endoscopy;  Laterality: N/A;  225  . CORONARY ANGIOPLASTY WITH STENT PLACEMENT  04/2011   mid RCA: 3.0 X38 mm Promus DES. Residual 40% disease proximally  . ESOPHAGOGASTRODUODENOSCOPY N/A 09/10/2019    Procedure: ESOPHAGOGASTRODUODENOSCOPY (EGD);  Surgeon: Clarene Essex, MD;  Location: [facility];  Service: Endoscopy;  Laterality: N/A;  . NECK SURGERY       SOCIAL HISTORY:  Social History   Socioeconomic History  . Marital status: Widowed    Spouse name: Not on file  . Number of children: Not on file  . Years of education: Not on file  . Highest education level: Not on file  Occupational History  . 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 Smoking status: Former Smoker    Packs/day: 3.00    Years: 25.00    Pack years: 75.00    Types: Cigarettes    Quit date: 07/10/1994    Years since quitting: 25.3  . Smokeless tobacco: Never Used  Substance and Sexual Activity  . Alcohol use: No  . Drug use: Never  . Sexual activity: Not on file  Other Topics Concern  . Not on file  Social History Narrative  . Not on file   Social Determinants of Health   Financial Resource Strain:   . Difficulty of Paying Living Expenses:   Food Insecurity: No Food Insecurity  . Worried About Charity fundraiser in the Last Year: Never true  . Ran Out of Food in the Last Year: Never true  Transportation Needs: No Transportation Needs  . Lack of Transportation (Medical): No  . Lack of Transportation (Non-Medical): No  Physical Activity:   . Days of Exercise per Week:   . Minutes of Exercise per Session:   Stress:   . Feeling of Stress :   Social Connections:   . Frequency of Communication with Friends and Family:   . Frequency of Social Gatherings with Friends and Family:   . Attends Religious Services:   . Active Member of Clubs or Organizations:   . Attends Archivist Meetings:   Marland Kitchen Marital Status:   Intimate Partner Violence:   . Fear of Current or Ex-Partner:   . Emotionally Abused:   Marland Kitchen Physically Abused:   . Sexually Abused:     FAMILY HISTORY:  Family History  Problem Relation Age of Onset  . Heart failure Mother   . Cancer Father      CURRENT MEDICATIONS:  Outpatient Encounter Medications as of 11/13/2019  Medication Sig  . aspirin EC 81 MG tablet Take 1 tablet (81 mg total) by mouth daily.  Marland Kitchen atorvastatin (LIPITOR) 10 MG tablet TAKE ONE TABLET BY MOUTH DAILY  . Calcium Carbonate-Vit D-Min (CALCIUM 600+D PLUS MINERALS) 600-400 MG-UNIT CHEW Chew 1 tablet by mouth 3 (three) times daily.   . cyanocobalamin (,VITAMIN B-12,) 1000 MCG/ML injection Inject 1,000 mcg into the skin every 30 (thirty) days.   Marland Kitchen diltiazem (CARDIZEM CD) 240 MG 24 hr capsule Take 1 capsule (240 mg total) by mouth daily.  Marland Kitchen dronabinol (MARINOL) 10 MG capsule Take 1 capsule (10 mg total) by mouth 2 (two) times daily before lunch and supper.  . furosemide (LASIX) 20 MG tablet Take 40 mg by mouth daily.   . magnesium oxide (MAG-OX) 400 (241.3  Mg) MG tablet Take 1 tablet by mouth 2 (two) times daily.  . metoprolol tartrate (LOPRESSOR) 25 MG tablet Take 1.5 tablets (37.5 mg total) by mouth 2 (two) times daily.  . montelukast (SINGULAIR) 10 MG tablet Take 1 tablet three days prior to your first treatment and the day of treatment. Then take one tablet on the day of treatment there after.  . pantoprazole (PROTONIX) 40 MG tablet Take 1 tablet (40 mg total) by mouth 2 (two) times daily.  . potassium chloride (K-DUR) 10 MEQ tablet TAKE ONE TABLET BY MOUTH THREE TIMES DAILY.  Marland Kitchen umeclidinium-vilanterol (ANORO ELLIPTA) 62.5-25 MCG/INH AEPB Inhale 1 puff into the lungs daily.  Marland Kitchen albuterol (VENTOLIN HFA) 108 (90 Base) MCG/ACT inhaler Inhale 2 puffs into the lungs every 6 (six) hours as needed for wheezing or shortness of breath. (Patient not taking: Reported on 11/13/2019)  . diazepam (VALIUM) 5 MG tablet TAKE ONE TABLET BY MOUTH EVERY 6 HOURS AS NEEDED FOR ANXIETY (Patient not taking: Reported on 11/13/2019)  . fluticasone (FLONASE) 50 MCG/ACT nasal spray Place 2 sprays into both nostrils as needed.   Marland Kitchen guaiFENesin (MUCINEX) 600 MG 12 hr tablet Take 600 mg 2 (two) times  daily as needed by mouth for cough or to loosen phlegm.  . ondansetron (ZOFRAN) 8 MG tablet Take 1 tablet (8 mg total) by mouth every 8 (eight) hours as needed for nausea or vomiting. (Patient not taking: Reported on 11/13/2019)  . polyethylene glycol powder (GLYCOLAX/MIRALAX) 17 GM/SCOOP powder Take by mouth as needed.    No facility-administered encounter medications on file as of 11/13/2019.    ALLERGIES:  Allergies  Allergen Reactions  . Lisinopril Cough  . Plavix [Clopidogrel Bisulfate] Swelling     PHYSICAL EXAM:  ECOG Performance status: 1  Vitals:   11/13/19 1209  BP: 114/63  Pulse: 84  Resp: 16  Temp: (!) 96.9 F (36.1 C)  SpO2: 98%   Filed Weights   11/13/19 1209  Weight: 165 lb (74.8 kg)    Physical Exam Vitals reviewed.  Constitutional:      Appearance: Normal appearance.  Cardiovascular:     Rate and Rhythm: Normal rate and regular rhythm.     Heart sounds: Normal heart sounds.  Pulmonary:     Effort: Pulmonary effort is normal.     Breath sounds: Normal breath sounds.  Abdominal:     General: There is no distension.     Palpations: Abdomen is soft. There is no mass.  Musculoskeletal:        General: No swelling.  Skin:    General: Skin is warm.  Neurological:     General: No focal deficit present.     Mental Status: She is alert and oriented to person, place, and time.  Psychiatric:        Mood and Affect: Mood normal.        Behavior: Behavior normal.      LABORATORY DATA:  I have reviewed the labs as listed.  CBC    Component Value Date/Time   WBC 6.5 11/13/2019 1107   RBC 3.83 (L) 11/13/2019 1107   HGB 11.7 (L) 11/13/2019 1107   HCT 37.6 11/13/2019 1107   PLT 188 11/13/2019 1107   MCV 98.2 11/13/2019 1107   MCH 30.5 11/13/2019 1107   MCHC 31.1 11/13/2019 1107   RDW 13.4 11/13/2019 1107   LYMPHSABS 0.8 11/13/2019 1107   MONOABS 1.2 (H) 11/13/2019 1107   EOSABS 0.1 11/13/2019 1107   BASOSABS  0.0 11/13/2019 1107   CMP Latest Ref  Rng & Units 11/13/2019 11/06/2019 10/29/2019  Glucose 70 - 99 mg/dL 110(H) 129(H) 116(H)  BUN 8 - 23 mg/dL '15 16 23  '$ Creatinine 0.44 - 1.00 mg/dL 1.24(H) 1.43(H) 1.48(H)  Sodium 135 - 145 mmol/L 140 138 141  Potassium 3.5 - 5.1 mmol/L 3.7 3.8 3.8  Chloride 98 - 111 mmol/L 105 104 105  CO2 22 - 32 mmol/L '25 23 27  '$ Calcium 8.9 - 10.3 mg/dL 8.8(L) 8.7(L) 8.3(L)  Total Protein 6.5 - 8.1 g/dL 6.3(L) 6.1(L) 5.9(L)  Total Bilirubin 0.3 - 1.2 mg/dL 0.5 0.7 0.5  Alkaline Phos 38 - 126 U/L 193(H) 88 111  AST 15 - 41 U/L 52(H) 43(H) 33  ALT 0 - 44 U/L 62(H) 31 31       DIAGNOSTIC IMAGING:  I have reviewed scans.   I have reviewed Venita Lick LPN's note and agree with the documentation.  I personally performed a face-to-face visit, made revisions and my assessment and plan is as follows.    ASSESSMENT & PLAN:   Multiple myeloma (HCC) 1.  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  Last treatment on 08/25/2019. -She was hospitalized with severe anemia, weakness and pneumonia and shortness of breath from 226 2021-3 02/2020. -We reviewed myeloma labs from 11/06/2019.  M spike has slightly gone up to 0.3 g from 0.2 g on 10/29/2019 and 10/01/2019.  Free light chains, kappa, 57.7.  Ratio is 4.01, slightly up from 3.3. -CBC was normal.  LFTs show increased AST of 52 and ALT of 62.  Creatinine is better at 1.24. -She is still weak.  I would not start her treatment yet. -I will reevaluate her in 2 to 3 weeks.  We plan to start on a single agent therapy when the M spike is consistently going up. -If the LFTs continue to be high, will consider scanning.  2.  Weight loss: -She lost 4 pounds since last visit.  She is drinking 2 ensures per day.  She is eating 1 meal per day. -I started her on Marinol 10 mg twice daily at last visit.  She could not take it because of nausea. -I have instructed her to take Compazine 1  hour prior to taking Marinol.  She will try that.  3.  Myeloma bone disease: -She is receiving Zometa every 3 months.  Last dose was on 07/28/2019.  She will continue calcium and vitamin D.  4.  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  She denies any melena or bleeding per rectum. -Hemoglobin today is 1.7 and stable.  This is from combination of CKD and myelosuppression.  She receives intermittent Aranesp and Feraheme.  5.  CKD: -Baseline creatinine between 1.2-1.3.  Creatinine today has improved 1.24. -She is taking Lasix 40 mg in the mornings.  She takes another dose of 20 mg in the afternoon if needed.  6.  Right ear pain: -She reported right ear pain and drainage in the back of the throat.  She was given antibiotic by Dr. Woody Seller. -Examination today did not reveal any significant pathology.  We will make referral to Dr. Leonarda Salon.     Orders placed this encounter:  No orders of the defined types were placed in this encounter.    Derek Jack, MD [place] [phone]

## 2019-11-13 NOTE — Assessment & Plan Note (Addendum)
1.  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  Last treatment on 08/25/2019. -She was hospitalized with severe anemia, weakness and pneumonia and shortness of breath from 226 2021-3 02/2020. -We reviewed myeloma labs from 11/06/2019.  M spike has slightly gone up to 0.3 g from 0.2 g on 10/29/2019 and 10/01/2019.  Free light chains, kappa, 57.7.  Ratio is 4.01, slightly up from 3.3. -CBC was normal.  LFTs show increased AST of 52 and ALT of 62.  Creatinine is better at 1.24. -She is still weak.  I would not start her treatment yet. -I will reevaluate her in 2 to 3 weeks.  We plan to start on a single agent therapy when the M spike is consistently going up. -If the LFTs continue to be high, will consider scanning.  2.  Weight loss: -She lost 4 pounds since last visit.  She is drinking 2 ensures per day.  She is eating 1 meal per day. -I started her on Marinol 10 mg twice daily at last visit.  She could not take it because of nausea. -I have instructed her to take Compazine 1 hour prior to taking Marinol.  She will try that.  3.  Myeloma bone disease: -She is receiving Zometa every 3 months.  Last dose was on 07/28/2019.  She will continue calcium and vitamin D.  4.  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  She denies any melena or bleeding per rectum. -Hemoglobin today is 1.7 and stable.  This is from combination of CKD and myelosuppression.  She receives intermittent Aranesp and Feraheme.  5.  CKD: -Baseline creatinine between 1.2-1.3.  Creatinine today has improved 1.24. -She is taking Lasix 40 mg in the mornings.  She takes another dose of 20 mg in the afternoon if needed.  6.  Right ear pain: -She reported right ear pain and drainage in the back of the throat.  She was given antibiotic by Dr. Vyas. -Examination today did not reveal any significant pathology.  We will make  referral to Dr. Teah. 

## 2019-11-13 NOTE — Patient Instructions (Addendum)
[place] at [facility] Discharge Instructions  You were seen today by Dr. Delton Coombes. He went over your recent lab results. Try taking the nausea medication 30 minutes prior to taking the appetite pill. He will see you back in 3 weeks for labs and follow up.   Thank you for choosing [place] at [facility] to provide your oncology and hematology care.  To afford each patient quality time with our provider, please arrive at least 15 minutes before your scheduled appointment time.   If you have a lab appointment with the [place] [facility] please come in thru the  Main Entrance and check in at the main information desk  You need to re-schedule your appointment should you arrive 10 or more minutes late.  We strive to give you quality time with our providers, and arriving late affects you and other patients whose appointments are after yours.  Also, if you no show three or more times for appointments you may be dismissed from the clinic at the providers discretion.     Again, thank you for choosing [facility].  Our hope is that these requests will decrease the amount of time that you wait before being seen by our physicians.       _____________________________________________________________  Should you have questions after your visit to [facility], please contact our office at [phone] between the hours of 8:00 a.m. and 4:30 p.m.  Voicemails left after 4:00 p.m. will not be returned until the following business day.  For prescription refill requests, have your pharmacy contact our office and allow 72 hours.    Cancer Center Support Programs:   > Cancer Support Group  2nd Tuesday of the month 1pm-2pm, Journey Room

## 2019-11-14 ENCOUNTER — Other Ambulatory Visit (HOSPITAL_COMMUNITY): Payer: Self-pay | Admitting: Hematology

## 2019-11-14 DIAGNOSIS — C9000 Multiple myeloma not having achieved remission: Secondary | ICD-10-CM

## 2019-11-14 LAB — KAPPA/LAMBDA LIGHT CHAINS
Kappa free light chain: 58.4 mg/L — ABNORMAL HIGH (ref 3.3–19.4)
Kappa, lambda light chain ratio: 4.06 — ABNORMAL HIGH (ref 0.26–1.65)
Lambda free light chains: 14.4 mg/L (ref 5.7–26.3)

## 2019-11-14 LAB — H. PYLORI ANTIBODY, IGG: H Pylori IgG: 5.73 Index Value — ABNORMAL HIGH (ref 0.00–0.79)

## 2019-11-15 LAB — PROTEIN ELECTROPHORESIS, SERUM
A/G Ratio: 1.1 (ref 0.7–1.7)
Albumin ELP: 3 g/dL (ref 2.9–4.4)
Alpha-1-Globulin: 0.3 g/dL (ref 0.0–0.4)
Alpha-2-Globulin: 0.9 g/dL (ref 0.4–1.0)
Beta Globulin: 0.9 g/dL (ref 0.7–1.3)
Gamma Globulin: 0.6 g/dL (ref 0.4–1.8)
Globulin, Total: 2.7 g/dL (ref 2.2–3.9)
M-Spike, %: 0.4 g/dL — ABNORMAL HIGH
Total Protein ELP: 5.7 g/dL — ABNORMAL LOW (ref 6.0–8.5)

## 2019-11-17 ENCOUNTER — Other Ambulatory Visit (HOSPITAL_COMMUNITY): Payer: Self-pay | Admitting: Otolaryngology

## 2019-11-17 ENCOUNTER — Other Ambulatory Visit: Payer: Self-pay | Admitting: Otolaryngology

## 2019-11-17 DIAGNOSIS — H918X9 Other specified hearing loss, unspecified ear: Secondary | ICD-10-CM

## 2019-11-19 ENCOUNTER — Other Ambulatory Visit (HOSPITAL_COMMUNITY): Payer: Medicare Other

## 2019-11-19 ENCOUNTER — Ambulatory Visit (HOSPITAL_COMMUNITY): Payer: Medicare Other | Admitting: Hematology

## 2019-11-25 ENCOUNTER — Encounter (INDEPENDENT_AMBULATORY_CARE_PROVIDER_SITE_OTHER): Payer: Self-pay | Admitting: *Deleted

## 2019-11-25 ENCOUNTER — Encounter (INDEPENDENT_AMBULATORY_CARE_PROVIDER_SITE_OTHER): Payer: Self-pay | Admitting: Internal Medicine

## 2019-11-25 ENCOUNTER — Ambulatory Visit (INDEPENDENT_AMBULATORY_CARE_PROVIDER_SITE_OTHER): Payer: Medicare Other | Admitting: Internal Medicine

## 2019-11-25 ENCOUNTER — Other Ambulatory Visit: Payer: Self-pay

## 2019-11-25 DIAGNOSIS — R945 Abnormal results of liver function studies: Secondary | ICD-10-CM

## 2019-11-25 DIAGNOSIS — A048 Other specified bacterial intestinal infections: Secondary | ICD-10-CM | POA: Diagnosis not present

## 2019-11-25 DIAGNOSIS — I2510 Atherosclerotic heart disease of native coronary artery without angina pectoris: Secondary | ICD-10-CM | POA: Diagnosis not present

## 2019-11-25 DIAGNOSIS — K253 Acute gastric ulcer without hemorrhage or perforation: Secondary | ICD-10-CM | POA: Diagnosis not present

## 2019-11-25 DIAGNOSIS — R7989 Other specified abnormal findings of blood chemistry: Secondary | ICD-10-CM

## 2019-11-25 DIAGNOSIS — K259 Gastric ulcer, unspecified as acute or chronic, without hemorrhage or perforation: Secondary | ICD-10-CM | POA: Insufficient documentation

## 2019-11-25 NOTE — Progress Notes (Signed)
Presenting complaint;  Follow-up for gastric ulcer. Positive H. pylori serology. Elevated transaminases.  History of present illness  Patient is 79 year old Caucasian female who has been referred by Dr. Delton Coombes for management of GI disease. Patient requested follow-up for GI issues locally and desires not to go back to [place] because she lives in [place]. She was last seen by me in March 2016 when she had colonoscopy. Patient was diagnosed with multiple myeloma in August 2016 and has been under care of Dr. Delton Coombes. She has received various chemotherapies. Most recently she had been receiving daratumumab/hyaluronidase-fihj therapy has been on hold for now. Patient was evaluated for profound weakness and shortness of breath on 09/05/2019 at [place]. Patient was found to be anemic in CHF and had pneumonia. Her troponin levels were elevated. She was therefore transferred to [facility] for further management. She received 2 units of PRBCs. Her troponin levels were felt to be due to CHF and not marker for myocardial injury. During that hospitalization she developed upper GI bleed. She underwent esophagogastroduodenoscopy by Dr. Ernesta Amble on September 10, 2019. 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 Patient is advised not to take NSAIDs and discharged on a PPI. Follow-up EGD was recommended. I reviewed patient's records and noted that she had not had H. pylori testing. Therefore I asked Dr. Delton Coombes to do this test along with other tests and H. pylori serology has come back positive.  Patient says she is not having abdominal pain melena or rectal bleeding. She also denies heartburn dysphagia or vomiting. She has very poor appetite. She states smell of food makes her sick. She has lost 15 pounds in the last 2 to 3 months. She states she used to weigh 175 pounds and today she is 160  pounds. She is trying to drink Ensure while she cannot eat. Her bowels move every 2 to 3 days. She denies fever chills or night sweats. Review of the systems is also negative for pruritus. Patient states her weakness has decreased. She is trying to walk for 10 minutes every day. She is not having any muscle or bone pains.   Current Medications: Outpatient Encounter Medications as of 11/25/2019  Medication Sig  . albuterol (VENTOLIN HFA) 108 (90 Base) MCG/ACT inhaler Inhale 2 puffs into the lungs every 6 (six) hours as needed for wheezing or shortness of breath.  Marland Kitchen aspirin EC 81 MG tablet Take 1 tablet (81 mg total) by mouth daily.  Marland Kitchen atorvastatin (LIPITOR) 10 MG tablet TAKE ONE TABLET BY MOUTH DAILY  . Calcium Carbonate-Vit D-Min (CALCIUM 600+D PLUS MINERALS) 600-400 MG-UNIT CHEW Chew 1 tablet by mouth 3 (three) times daily.   . cyanocobalamin (,VITAMIN B-12,) 1000 MCG/ML injection Inject 1,000 mcg into the skin every 30 (thirty) days.   Marland Kitchen diltiazem (CARDIZEM CD) 240 MG 24 hr capsule Take 1 capsule (240 mg total) by mouth daily.  Marland Kitchen dronabinol (MARINOL) 10 MG capsule Take 1 capsule (10 mg total) by mouth 2 (two) times daily before lunch and supper.  . fluticasone (FLONASE) 50 MCG/ACT nasal spray Place 2 sprays into both nostrils as needed.   . furosemide (LASIX) 20 MG tablet Take 40 mg by mouth daily.   . magnesium oxide (MAG-OX) 400 (241.3 Mg) MG tablet Take 1 tablet by mouth 2 (two) times daily.  . metoprolol tartrate (LOPRESSOR) 25 MG tablet Take 1.5 tablets (37.5 mg total) by  mouth 2 (two) times daily.  . ondansetron (ZOFRAN) 8 MG tablet Take 1 tablet (8 mg total) by mouth every 8 (eight) hours as needed for nausea or vomiting.  . pantoprazole (PROTONIX) 40 MG tablet Take 1 tablet (40 mg total) by mouth 2 (two) times daily.  . polyethylene glycol powder (GLYCOLAX/MIRALAX) 17 GM/SCOOP powder Take by mouth as needed.   . potassium chloride (K-DUR) 10 MEQ tablet TAKE ONE TABLET BY MOUTH THREE  TIMES DAILY.  Marland Kitchen umeclidinium-vilanterol (ANORO ELLIPTA) 62.5-25 MCG/INH AEPB Inhale 1 puff into the lungs daily.  Marland Kitchen dexamethasone (DECADRON) 2 MG tablet Take five tablets (57m) by mouth weekly. (Patient not taking: Reported on 11/25/2019)  . diazepam (VALIUM) 5 MG tablet TAKE ONE TABLET BY MOUTH EVERY 6 HOURS AS NEEDED FOR ANXIETY (Patient not taking: Reported on 11/13/2019)  . [DISCONTINUED] guaiFENesin (MUCINEX) 600 MG 12 hr tablet Take 600 mg 2 (two) times daily as needed by mouth for cough or to loosen phlegm.  . [DISCONTINUED] montelukast (SINGULAIR) 10 MG tablet Take 1 tablet three days prior to your first treatment and the day of treatment. Then take one tablet on the day of treatment there after. (Patient not taking: Reported on 11/25/2019)   No facility-administered encounter medications on file as of 11/25/2019.   Past medical history Past Medical History:  Diagnosis Date  . 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 CAD (coronary artery disease)   . COPD ([place] pulmonary disease) ([place]   . History of tobacco abuse   . Hyperlipidemia   . Hypertension   . 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 Multiple myeloma ([facility]   . Vitamin B12 deficiency 04/15/2016   Overview:  Vitamin B12 level documented 155, January 2016 with the normal range being 211-924   Past Surgical History:  Procedure Laterality Date  . BACK SURGERY    . CARDIAC CATHETERIZATION  04/2011   right and left cath showing normal right heart pressures,but newly diagnosed coronary artery disease/drug eluting stent placed to RCA with residual disease in the proximal RCA and LAD and ramus, normal LV function and 60-65% EF  . COLONOSCOPY N/A 09/30/2014   Procedure: COLONOSCOPY;  Surgeon: NRogene Houston MD;  [facility];  Service: Endoscopy;  Laterality: N/A;  225  . CORONARY ANGIOPLASTY WITH STENT PLACEMENT  04/2011   mid RCA: 3.0 X38 mm Promus DES. Residual 40% disease  proximally  . ESOPHAGOGASTRODUODENOSCOPY N/A 09/10/2019   Procedure: ESOPHAGOGASTRODUODENOSCOPY (EGD);  Surgeon: MClarene Essex MD;  [facility]  Service: Endoscopy;  Laterality: N/A;  . NECK SURGERY     Allergies  Allergies  Allergen Reactions  . Lisinopril Cough  . Plavix [Clopidogrel Bisulfate] Swelling   Family history  Both parents heart disease. Mother had CHF and father had advanced carcinoma primary unknown. She has 3 brothers and 1 sister living. 2 brothers are disease. One died of lung carcinoma in his 738sand another brother died at age 6469after he had outpatient barium study. Patient says he was found dead at home 3 days later.  Social history  She lost her husband in August 2016. 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 She has 1 son age 849who lives in [place] He and his wife spend every weekend with patient and help her with yard care etc. 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 She smokes cigarettes for 15 years about a pack a  day but quit in 1990. She has never drank alcohol.  Physical examination  Blood pressure 123/79, pulse 96, temperature (!) 97 F (36.1 C), temperature source Temporal, height 5' 7" (1.702 m), weight 160 lb 8 oz (72.8 kg). Patient is alert and in no acute distress. She is wearing facial mask. Conjunctiva is pink. Sclera is nonicteric Oropharyngeal mucosa is normal. No neck masses or thyromegaly noted. Cardiac exam with regular rhythm normal S1 and S2. No murmur or gallop noted. Lungs are clear to auscultation. Abdomen is symmetrical soft and nontender without organomegaly or masses. No LE edema or clubbing noted.  Labs/studies Results:  CBC Latest Ref Rng & Units 11/13/2019 11/06/2019 10/29/2019  WBC 4.0 - 10.5 K/uL 6.5 6.1 6.2  Hemoglobin 12.0 - 15.0 g/dL 11.7(L) 11.7(L) 11.2(L)  Hematocrit 36.0 - 46.0 % 37.6 37.0 36.4  Platelets 150 - 400 K/uL 188 167 220    CMP Latest Ref Rng  & Units 11/13/2019 11/06/2019 10/29/2019  Glucose 70 - 99 mg/dL 110(H) 129(H) 116(H)  BUN 8 - 23 mg/dL _0 Creatinine 0.44 - 1.00 mg/dL 1.24(H) 1.43(H) 1.48(H)  Sodium 135 - 145 mmol/L 140 138 141  Potassium 3.5 - 5.1 mmol/L 3.7 3.8 3.8  Chloride 98 - 111 mmol/L 105 104 105  CO2 22 - 32 mmol/L _1 Calcium 8.9 - 10.3 mg/dL 8.8(L) 8.7(L) 8.3(L)  Total Protein 6.5 - 8.1 g/dL 6.3(L) 6.1(L) 5.9(L)  Total Bilirubin 0.3 - 1.2 mg/dL 0.5 0.7 0.5  Alkaline Phos 38 - 126 U/L 193(H) 88 111  AST 15 - 41 U/L 52(H) 43(H) 33  ALT 0 - 44 U/L 62(H) 31 31    Hepatic Function Latest Ref Rng & Units 11/13/2019 11/06/2019 10/29/2019  Total Protein 6.5 - 8.1 g/dL 6.3(L) 6.1(L) 5.9(L)  Albumin 3.5 - 5.0 g/dL 3.3(L) 3.6 3.4(L)  AST 15 - 41 U/L 52(H) 43(H) 33  ALT 0 - 44 U/L 62(H) 31 31  Alk Phosphatase 38 - 126 U/L 193(H) 88 111  Total Bilirubin 0.3 - 1.2 mg/dL 0.5 0.7 0.5  Bilirubin, Direct 0.1 - 0.5 mg/dL - - -    H. pylori serology positive.   Assessment:  #1. Upper GI bleed secondary to gastric ulcer about 10 weeks ago. She is on double dose PPI. Hemoglobin is near normal. She is not having any abdominal pain but she remains with anorexia and weight loss. Her H. pylori serology is positive but I am afraid to offer treatment at this time as as it may make results in worsening nausea and vomiting. She will need follow-up EGD at some point to document complete healing of this ulcer. Until then she will continue double dose PPI.  #2. H. pylori gastritis. She will be treated the near future.  #3. Abnormal LFTs. She has mildly elevated transaminases and alkaline phosphatase. Alkaline phosphatase elevation may be due to multiple myeloma. Alkaline phosphatase was normal few weeks ago. No recent hepatobiliary imaging on file.  Recommendations  Patient advised to drink at least 3 cans of Ensure daily while she is not able to eat. Continue pantoprazole at 40 mg p.o. twice daily. Abdominal ultrasound to be  scheduled in near future. Timing of esophagogastroduodenoscopy to be determined. I will be contacting patient results of ultrasound and further recommendations.

## 2019-11-25 NOTE — Patient Instructions (Signed)
Physician will call with results of [place] when completed EGD to be scheduled next month

## 2019-11-27 ENCOUNTER — Ambulatory Visit (HOSPITAL_COMMUNITY)
Admission: RE | Admit: 2019-11-27 | Discharge: 2019-11-27 | Disposition: A | Discharge status: Home / Self Care | Payer: Medicare Other | Source: Ambulatory Visit | Attending: Internal Medicine | Admitting: Internal Medicine

## 2019-11-27 ENCOUNTER — Other Ambulatory Visit: Payer: Self-pay

## 2019-11-27 ENCOUNTER — Other Ambulatory Visit (HOSPITAL_COMMUNITY): Payer: Self-pay | Admitting: Nurse Practitioner

## 2019-11-27 DIAGNOSIS — R945 Abnormal results of liver function studies: Secondary | ICD-10-CM | POA: Diagnosis present

## 2019-11-27 DIAGNOSIS — R7989 Other specified abnormal findings of blood chemistry: Secondary | ICD-10-CM

## 2019-12-01 ENCOUNTER — Other Ambulatory Visit (INDEPENDENT_AMBULATORY_CARE_PROVIDER_SITE_OTHER): Payer: Self-pay | Admitting: Internal Medicine

## 2019-12-01 ENCOUNTER — Other Ambulatory Visit: Payer: Self-pay | Admitting: Cardiology

## 2019-12-01 ENCOUNTER — Other Ambulatory Visit (HOSPITAL_COMMUNITY): Payer: Self-pay | Admitting: Hematology

## 2019-12-01 DIAGNOSIS — F419 Anxiety disorder, unspecified: Secondary | ICD-10-CM

## 2019-12-01 DIAGNOSIS — C9001 Multiple myeloma in remission: Secondary | ICD-10-CM

## 2019-12-01 MED ORDER — PYLERA 140-125-125 MG PO CAPS: 3.0000 | ORAL_CAPSULE | Freq: Three times a day (TID) | ORAL | 0 refills | Status: DC

## 2019-12-03 ENCOUNTER — Inpatient Hospital Stay (HOSPITAL_COMMUNITY): Payer: Medicare Other

## 2019-12-03 ENCOUNTER — Inpatient Hospital Stay (HOSPITAL_BASED_OUTPATIENT_CLINIC_OR_DEPARTMENT_OTHER): Payer: Medicare Other | Admitting: Hematology

## 2019-12-03 ENCOUNTER — Other Ambulatory Visit: Payer: Self-pay

## 2019-12-03 VITALS — BP 105/56 | HR 72 | Temp 97.8°F | Resp 18 | Wt 162.5 lb

## 2019-12-03 DIAGNOSIS — C9000 Multiple myeloma not having achieved remission: Secondary | ICD-10-CM

## 2019-12-03 LAB — CBC WITH DIFFERENTIAL/PLATELET
Abs Immature Granulocytes: 0.01 10*3/uL (ref 0.00–0.07)
Basophils Absolute: 0 10*3/uL (ref 0.0–0.1)
Basophils Relative: 1 %
Eosinophils Absolute: 0.2 10*3/uL (ref 0.0–0.5)
Eosinophils Relative: 4 %
HCT: 36 % (ref 36.0–46.0)
Hemoglobin: 11.2 g/dL — ABNORMAL LOW (ref 12.0–15.0)
Immature Granulocytes: 0 %
Lymphocytes Relative: 18 %
Lymphs Abs: 1.1 10*3/uL (ref 0.7–4.0)
MCH: 29.6 pg (ref 26.0–34.0)
MCHC: 31.1 g/dL (ref 30.0–36.0)
MCV: 95 fL (ref 80.0–100.0)
Monocytes Absolute: 0.9 10*3/uL (ref 0.1–1.0)
Monocytes Relative: 14 %
Neutro Abs: 4 10*3/uL (ref 1.7–7.7)
Neutrophils Relative %: 63 %
Platelets: 238 10*3/uL (ref 150–400)
RBC: 3.79 MIL/uL — ABNORMAL LOW (ref 3.87–5.11)
RDW: 13.2 % (ref 11.5–15.5)
WBC: 6.2 10*3/uL (ref 4.0–10.5)
nRBC: 0 % (ref 0.0–0.2)

## 2019-12-03 LAB — COMPREHENSIVE METABOLIC PANEL
ALT: 17 U/L (ref 0–44)
AST: 26 U/L (ref 15–41)
Albumin: 3.4 g/dL — ABNORMAL LOW (ref 3.5–5.0)
Alkaline Phosphatase: 100 U/L (ref 38–126)
Anion gap: 11 (ref 5–15)
BUN: 19 mg/dL (ref 8–23)
CO2: 24 mmol/L (ref 22–32)
Calcium: 8.7 mg/dL — ABNORMAL LOW (ref 8.9–10.3)
Chloride: 104 mmol/L (ref 98–111)
Creatinine, Ser: 1.56 mg/dL — ABNORMAL HIGH (ref 0.44–1.00)
GFR calc Af Amer: 36 mL/min — ABNORMAL LOW (ref >60)
GFR calc non Af Amer: 31 mL/min — ABNORMAL LOW (ref >60)
Glucose, Bld: 112 mg/dL — ABNORMAL HIGH (ref 70–99)
Potassium: 3.9 mmol/L (ref 3.5–5.1)
Sodium: 139 mmol/L (ref 135–145)
Total Bilirubin: 0.7 mg/dL (ref 0.3–1.2)
Total Protein: 6.1 g/dL — ABNORMAL LOW (ref 6.5–8.1)

## 2019-12-03 LAB — LACTATE DEHYDROGENASE: LDH: 181 U/L (ref 98–192)

## 2019-12-03 NOTE — Progress Notes (Signed)
[name], [place] [address]   CLINIC:  Medical Oncology/Hematology  PCP:  [name], MD [address] / [place] [address]  [phone]  REASON FOR VISIT:  Follow-up for multiple myeloma   CURRENT THERAPY: Darzalex, pomalidomide and dexamethasone on hold.  INTERVAL HISTORY:  [name], a 79 y.o. female, returns for routine follow-up for her multiple myeloma. Hedi was last seen on 11/13/2019.  She reported she has been feeling very well overall in the last few days.  She has also been eating well.  Numbness in the feet has been stable.  Mild leg swellings are also stable.  Denies any fevers or infections.  She is supposed to start treatment for H pylori.    REVIEW OF SYSTEMS:  Review of Systems  Constitutional: Positive for appetite change (moderately decreased) and fatigue (moderate).  Cardiovascular: Positive for leg swelling (feet).  Neurological: Positive for numbness (hands and feet).  All other systems reviewed and are negative.   PAST MEDICAL/SURGICAL HISTORY:  Past Medical History:  Diagnosis Date  . 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 CAD (coronary artery disease)   . COPD (chronic obstructive pulmonary disease) ([place])   . History of tobacco abuse   . Hyperlipidemia   . Hypertension   . Hypogammaglobulinemia ([place]) 01/15/2016  . Multiple myeloma ([place])   . Vitamin B12 deficiency 04/15/2016   Overview:  Vitamin B12 level documented 155, January 2016 with the normal range being 211-924   Past Surgical History:  Procedure Laterality Date  . BACK SURGERY    . CARDIAC CATHETERIZATION  04/2011   right and left cath showing normal right heart pressures,but newly diagnosed coronary artery disease/drug eluting stent placed to RCA with residual disease in the proximal RCA and LAD and ramus, normal LV function and 60-65% EF  . COLONOSCOPY N/A 09/30/2014   Procedure: COLONOSCOPY;  Surgeon: Rogene Houston, MD;  [facility];  Service: Endoscopy;  Laterality: N/A;  225  . CORONARY ANGIOPLASTY WITH STENT PLACEMENT  04/2011   mid RCA: 3.0 X38 mm Promus DES. Residual 40% disease proximally  . ESOPHAGOGASTRODUODENOSCOPY N/A 09/10/2019   Procedure: ESOPHAGOGASTRODUODENOSCOPY (EGD);  Surgeon: Clarene Essex, MD;  Location: [facility];  Service: Endoscopy;  Laterality: N/A;  . NECK SURGERY      SOCIAL HISTORY:  Social History   Socioeconomic History  . Marital status: Widowed    Spouse name: Not on file  . Number of children: Not on file  . Years of education: Not on file  . Highest education level: Not on file  Occupational History  . 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 Smoking status: Former Smoker    Packs/day: 3.00    Years: 25.00    Pack years: 75.00    Types: Cigarettes    Quit date: 07/10/1994    Years since quitting: 25.4  . Smokeless tobacco: Never Used  Substance and Sexual Activity  . Alcohol use: No  . Drug use: Never  . Sexual activity: Not on file  Other Topics Concern  . Not on file  Social History Narrative  . Not on file   Social Determinants of Health   Financial Resource Strain:   . Difficulty of Paying Living Expenses:   Food Insecurity: No Food Insecurity  . Worried About Charity fundraiser in the Last Year: Never true  .  Ran Out of Food in the Last Year: Never true  Transportation Needs: No Transportation Needs  . Lack of Transportation (Medical): No  . Lack of Transportation (Non-Medical): No  Physical Activity:   . Days of Exercise per Week:   . Minutes of Exercise per Session:   Stress:   . Feeling of Stress :   Social Connections:   . Frequency of Communication with Friends and Family:   . Frequency of Social Gatherings with Friends and Family:   . Attends Religious Services:   . Active Member of Clubs or Organizations:   . Attends Archivist Meetings:   Marland Kitchen Marital Status:   Intimate  Partner Violence:   . Fear of Current or Ex-Partner:   . Emotionally Abused:   Marland Kitchen Physically Abused:   . Sexually Abused:     FAMILY HISTORY:  Family History  Problem Relation Age of Onset  . Heart failure Mother   . Cancer Father     CURRENT MEDICATIONS:  Current Outpatient Medications  Medication Sig Dispense Refill  . aspirin EC 81 MG tablet Take 1 tablet (81 mg total) by mouth daily. 90 tablet 3  . atorvastatin (LIPITOR) 10 MG tablet TAKE ONE TABLET BY MOUTH DAILY 90 tablet 3  . bismuth-metronidazole-tetracycline (PYLERA) 140-125-125 MG capsule Take 3 capsules by mouth 4 (four) times daily -  before meals and at bedtime. 120 capsule 0  . Calcium Carbonate-Vit D-Min (CALCIUM 600+D PLUS MINERALS) 600-400 MG-UNIT CHEW Chew 1 tablet by mouth 3 (three) times daily.     . cyanocobalamin (,VITAMIN B-12,) 1000 MCG/ML injection Inject 1,000 mcg into the skin every 30 (thirty) days.     Marland Kitchen dexamethasone (DECADRON) 2 MG tablet Take five tablets ('10mg'$ ) by mouth weekly. 20 tablet 2  . diltiazem (CARDIZEM CD) 240 MG 24 hr capsule Take 1 capsule (240 mg total) by mouth daily. 90 capsule 3  . furosemide (LASIX) 20 MG tablet Take 40 mg by mouth daily.     . magnesium oxide (MAG-OX) 400 (241.3 Mg) MG tablet Take 1 tablet by mouth 2 (two) times daily.    . metoprolol tartrate (LOPRESSOR) 25 MG tablet Take 1.5 tablets (37.5 mg total) by mouth 2 (two) times daily. 75 tablet 3  . metroNIDAZOLE (FLAGYL) 500 MG tablet Take 500 mg by mouth 3 (three) times daily.    . pantoprazole (PROTONIX) 40 MG tablet Take 1 tablet (40 mg total) by mouth 2 (two) times daily. 60 tablet 0  . potassium chloride (KLOR-CON) 10 MEQ tablet TAKE ONE TABLET BY MOUTH THREE TIMES DAILY. 90 tablet 4  . predniSONE (STERAPRED UNI-PAK 21 TAB) 10 MG (21) TBPK tablet TAKE AS DIRECTED PER PACKAGE INSTRUCTIONS.    Marland Kitchen tetracycline (SUMYCIN) 500 MG capsule Take 500 mg by mouth 2 (two) times daily.     Marland Kitchen albuterol (VENTOLIN HFA) 108 (90 Base)  MCG/ACT inhaler Inhale 2 puffs into the lungs every 6 (six) hours as needed for wheezing or shortness of breath. (Patient not taking: Reported on 12/03/2019) 18 g 2  . diazepam (VALIUM) 5 MG tablet TAKE ONE TABLET BY MOUTH EVERY 6 HOURS AS NEEDED FOR ANXIETY (Patient not taking: Reported on 12/03/2019) 30 tablet 1  . famotidine (PEPCID) 20 MG tablet Take 20 mg by mouth 2 (two) times daily as needed.    . fluticasone (FLONASE) 50 MCG/ACT nasal spray Place 2 sprays into both nostrils as needed.     Marland Kitchen ipratropium (ATROVENT) 0.06 % nasal spray SMARTSIG:2 Spray(s) Both  Nares Twice Daily PRN    . ondansetron (ZOFRAN) 8 MG tablet Take 1 tablet (8 mg total) by mouth every 8 (eight) hours as needed for nausea or vomiting. (Patient not taking: Reported on 12/03/2019) 20 tablet 0  . polyethylene glycol powder (GLYCOLAX/MIRALAX) 17 GM/SCOOP powder Take by mouth as needed.     . umeclidinium-vilanterol (ANORO ELLIPTA) 62.5-25 MCG/INH AEPB Inhale 1 puff into the lungs daily. (Patient not taking: Reported on 12/03/2019) 60 each 1   No current facility-administered medications for this visit.    ALLERGIES:  Allergies  Allergen Reactions  . Lisinopril Cough  . Plavix [Clopidogrel Bisulfate] Swelling    PHYSICAL EXAM:  Performance status (ECOG): 1 - Symptomatic but completely ambulatory  Vitals:   12/03/19 1034  BP: (!) 105/56  Pulse: 72  Resp: 18  Temp: 97.8 F (36.6 C)  SpO2: 98%   Wt Readings from Last 3 Encounters:  12/03/19 162 lb 8 oz (73.7 kg)  11/25/19 160 lb 8 oz (72.8 kg)  11/13/19 165 lb (74.8 kg)   Physical Exam Vitals reviewed.  Constitutional:      Appearance: Normal appearance.  Cardiovascular:     Rate and Rhythm: Normal rate and regular rhythm.     Heart sounds: Normal heart sounds.  Pulmonary:     Effort: Pulmonary effort is normal.     Breath sounds: Normal breath sounds.  Abdominal:     Palpations: Abdomen is soft.  Neurological:     General: No focal deficit present.       Mental Status: She is alert and oriented to person, place, and time.  Psychiatric:        Mood and Affect: Mood normal.        Behavior: Behavior normal.     LABORATORY DATA:  I have reviewed the labs as listed.  CBC Latest Ref Rng & Units 12/03/2019 11/13/2019 11/06/2019  WBC 4.0 - 10.5 K/uL 6.2 6.5 6.1  Hemoglobin 12.0 - 15.0 g/dL 11.2(L) 11.7(L) 11.7(L)  Hematocrit 36.0 - 46.0 % 36.0 37.6 37.0  Platelets 150 - 400 K/uL 238 188 167   CMP Latest Ref Rng & Units 12/03/2019 11/13/2019 11/06/2019  Glucose 70 - 99 mg/dL 112(H) 110(H) 129(H)  BUN 8 - 23 mg/dL '19 15 16  '$ Creatinine 0.44 - 1.00 mg/dL 1.56(H) 1.24(H) 1.43(H)  Sodium 135 - 145 mmol/L 139 140 138  Potassium 3.5 - 5.1 mmol/L 3.9 3.7 3.8  Chloride 98 - 111 mmol/L 104 105 104  CO2 22 - 32 mmol/L '24 25 23  '$ Calcium 8.9 - 10.3 mg/dL 8.7(L) 8.8(L) 8.7(L)  Total Protein 6.5 - 8.1 g/dL 6.1(L) 6.3(L) 6.1(L)  Total Bilirubin 0.3 - 1.2 mg/dL 0.7 0.5 0.7  Alkaline Phos 38 - 126 U/L 100 193(H) 88  AST 15 - 41 U/L 26 52(H) 43(H)  ALT 0 - 44 U/L 17 62(H) 31      Component Value Date/Time   RBC 3.79 (L) 12/03/2019 0944   MCV 95.0 12/03/2019 0944   MCH 29.6 12/03/2019 0944   MCHC 31.1 12/03/2019 0944   RDW 13.2 12/03/2019 0944   LYMPHSABS 1.1 12/03/2019 0944   MONOABS 0.9 12/03/2019 0944   EOSABS 0.2 12/03/2019 0944   BASOSABS 0.0 12/03/2019 0944    DIAGNOSTIC IMAGING:  I have independently reviewed the scans and discussed with the patient. US Abdomen Complete  Result Date: 11/27/2019 CLINICAL DATA:  Elevated liver function tests. EXAM: ABDOMEN ULTRASOUND COMPLETE COMPARISON:  October 07, 2014. FINDINGS: Gallbladder: No gallstones or  wall thickening visualized. No sonographic Murphy sign noted by sonographer. Sludge is noted within gallbladder lumen. Common bile duct: Diameter: 7 mm which is within normal limits. Liver: No focal lesion identified. Increased echogenicity of hepatic parenchyma is noted suggesting hepatic steatosis or  other diffuse hepatocellular disease. Portal vein is patent on color Doppler imaging with normal direction of blood flow towards the liver. IVC: No abnormality visualized. Pancreas: Visualized portion unremarkable. Spleen: Size and appearance within normal limits. Right Kidney: Length: 9.6 cm. Echogenicity within normal limits. No mass or hydronephrosis visualized. Left Kidney: Length: 10.7 cm. Echogenicity within normal limits. Large bilobed cyst measuring 10.3 cm is noted. No mass or hydronephrosis visualized. Abdominal aorta: No aneurysm visualized. Other findings: None. IMPRESSION: Increased echogenicity of hepatic parenchyma is noted suggesting hepatic steatosis or other diffuse hepatocellular disease. Mild amount of sludge is noted within gallbladder lumen. 10.3 cm bilobed cyst is noted in left kidney. Electronically Signed   By: Marijo Conception M.D.   On: 11/27/2019 12:19     ASSESSMENT:  1.  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 -Hospitalized with severe anemia, weakness and pneumonia and shortness of breath on 09/05/2019 through 09/15/2019. -Myeloma labs from 12/03/2019 shows M spike 0.4 g.  Light chain ratio has gone up to 8.77 from 4.06.  Kappa light chains have increased to 100 from 58.  2.  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 -H. pylori antibody was positive.  Dr. Laural Golden is starting her on antibiotics. -Ultrasound of the abdomen on 11/27/2019 showed increased echogenicity of hepatic parenchyma consistent with steatosis.  Mild amount of sludge noted in the gallbladder.  10.3 cm bilobed cyst noted in the left kidney.    PLAN:  1.  IgG kappa multiple myeloma: -I have reviewed results of the blood work.  Myeloma work-up is worsening. -I have recommended restarting treatment.  She could not tolerate combination of daratumumab and  pomalidomide.  She has received pomalidomide in the past and had progression when she was taking it with exercise met. -Hence I have recommended single agent daratumumab and dexamethasone. -She will start next week.  I will see her back in 1 week for follow-up to see how she is tolerating.  2.  Weight loss: -Continue Marinol 10 mg twice daily.  Take Compazine prior to taking Marinol.  She gained 2 pounds since last visit.  3.  Myeloma bone disease: -Continue Zometa every 3 months.  Continue calcium and vitamin D.  4.  CKD: -Baseline creatinine between 1.2-1.3.  Creatinine today increased to 1.5.  We will closely monitor.  She is taking Lasix 40 in the morning and 20 mg in the evening.  5.  Normocytic anemia: -Hemoglobin today stable at 11.2.   Orders placed this encounter:  No orders of the defined types were placed in this encounter.    Derek Jack, MD [facility] [phone]   I, Jacqualyn Posey, am acting as a scribe for Dr. Sanda Linger.  I, Derek Jack MD, have reviewed the above documentation for accuracy and completeness, and I agree with the above.

## 2019-12-03 NOTE — Patient Instructions (Signed)
[facility] Discharge Instructions  You were seen today by Dr. Katragadda. He went over your recent results. He will see you back in for labs and follow up.   Thank you for choosing  Cancer Center at [facility] to provide your oncology and hematology care.  To afford each patient quality time with our provider, please arrive at least 15 minutes before your scheduled appointment time.   If you have a lab appointment with the Cancer Center please come in thru the  Main Entrance and check in at the main information desk  You need to re-schedule your appointment should you arrive 10 or more minutes late.  We strive to give you quality time with our providers, and arriving late affects you and other patients whose appointments are after yours.  Also, if you no show three or more times for appointments you may be dismissed from the clinic at the providers discretion.     Again, thank you for choosing [facility].  Our hope is that these requests will decrease the amount of time that you wait before being seen by our physicians.       _____________________________________________________________  Should you have questions after your visit to [facility], please contact our office at [phone] between the hours of 8:00 a.m. and 4:30 p.m.  Voicemails left after 4:00 p.m. will not be returned until the following business day.  For prescription refill requests, have your pharmacy contact our office and allow 72 hours.    Cancer Center Support Programs:   > Cancer Support Group  2nd Tuesday of the month 1pm-2pm, Journey Room    

## 2019-12-04 ENCOUNTER — Other Ambulatory Visit: Payer: Self-pay | Admitting: *Deleted

## 2019-12-04 LAB — PROTEIN ELECTROPHORESIS, SERUM
A/G Ratio: 1.5 (ref 0.7–1.7)
Albumin ELP: 3.5 g/dL (ref 2.9–4.4)
Alpha-1-Globulin: 0.2 g/dL (ref 0.0–0.4)
Alpha-2-Globulin: 0.8 g/dL (ref 0.4–1.0)
Beta Globulin: 0.8 g/dL (ref 0.7–1.3)
Gamma Globulin: 0.6 g/dL (ref 0.4–1.8)
Globulin, Total: 2.4 g/dL (ref 2.2–3.9)
M-Spike, %: 0.4 g/dL — ABNORMAL HIGH
Total Protein ELP: 5.9 g/dL — ABNORMAL LOW (ref 6.0–8.5)

## 2019-12-04 LAB — KAPPA/LAMBDA LIGHT CHAINS
Kappa free light chain: 100.9 mg/L — ABNORMAL HIGH (ref 3.3–19.4)
Kappa, lambda light chain ratio: 8.77 — ABNORMAL HIGH (ref 0.26–1.65)
Lambda free light chains: 11.5 mg/L (ref 5.7–26.3)

## 2019-12-04 NOTE — Patient Outreach (Addendum)
[place] [facility]) Care Management  12/04/2019  [name] August 23, 1940 [id number]   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 [facility] RN CM left HIPAA [facility] Portability and Accountability Act) compliant voicemail message along with CM's contact info.    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 She previously lived alone. She is needing assistance with all care needs and transportation at this time.  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, RN, BSN, [place] Coordinator Office number [phone] Mobile number [phone]  Main [facility] number [phone] Fax number [phone]

## 2019-12-05 ENCOUNTER — Telehealth (INDEPENDENT_AMBULATORY_CARE_PROVIDER_SITE_OTHER): Payer: Self-pay | Admitting: *Deleted

## 2019-12-05 NOTE — Telephone Encounter (Signed)
Insurance will not cover the Pylera. Per Dr.Rehman may call in the following Tetracycline 500 mg -take 1 by mouth three times a day for 10 days. No refills Flagyl 500 mg Take 1 by mouth three times daily for 10 days. No refills. Pepto Bismuth 1 tablespoon by mouth three times daily for 10 days. No refills.  This was called to [place] Dec 02 2019.

## 2019-12-09 ENCOUNTER — Other Ambulatory Visit (HOSPITAL_COMMUNITY): Payer: Medicare Other

## 2019-12-09 ENCOUNTER — Inpatient Hospital Stay (HOSPITAL_COMMUNITY): Payer: Medicare Other | Attending: Hematology

## 2019-12-09 ENCOUNTER — Other Ambulatory Visit: Payer: Self-pay

## 2019-12-09 VITALS — BP 138/62 | HR 80 | Temp 97.4°F | Resp 20

## 2019-12-09 DIAGNOSIS — Z7952 Long term (current) use of systemic steroids: Secondary | ICD-10-CM | POA: Diagnosis not present

## 2019-12-09 DIAGNOSIS — D649 Anemia, unspecified: Secondary | ICD-10-CM | POA: Insufficient documentation

## 2019-12-09 DIAGNOSIS — H9203 Otalgia, bilateral: Secondary | ICD-10-CM | POA: Insufficient documentation

## 2019-12-09 DIAGNOSIS — Z87891 Personal history of nicotine dependence: Secondary | ICD-10-CM | POA: Diagnosis not present

## 2019-12-09 DIAGNOSIS — C9000 Multiple myeloma not having achieved remission: Secondary | ICD-10-CM

## 2019-12-09 DIAGNOSIS — Z79899 Other long term (current) drug therapy: Secondary | ICD-10-CM | POA: Insufficient documentation

## 2019-12-09 DIAGNOSIS — Z9221 Personal history of antineoplastic chemotherapy: Secondary | ICD-10-CM | POA: Diagnosis not present

## 2019-12-09 DIAGNOSIS — K259 Gastric ulcer, unspecified as acute or chronic, without hemorrhage or perforation: Secondary | ICD-10-CM | POA: Insufficient documentation

## 2019-12-09 DIAGNOSIS — I2510 Atherosclerotic heart disease of native coronary artery without angina pectoris: Secondary | ICD-10-CM | POA: Diagnosis not present

## 2019-12-09 DIAGNOSIS — I129 Hypertensive chronic kidney disease with stage 1 through stage 4 chronic kidney disease, or unspecified chronic kidney disease: Secondary | ICD-10-CM | POA: Diagnosis not present

## 2019-12-09 DIAGNOSIS — Z5112 Encounter for antineoplastic immunotherapy: Secondary | ICD-10-CM | POA: Diagnosis present

## 2019-12-09 DIAGNOSIS — N189 Chronic kidney disease, unspecified: Secondary | ICD-10-CM | POA: Diagnosis not present

## 2019-12-09 DIAGNOSIS — E538 Deficiency of other specified B group vitamins: Secondary | ICD-10-CM

## 2019-12-09 DIAGNOSIS — E785 Hyperlipidemia, unspecified: Secondary | ICD-10-CM | POA: Insufficient documentation

## 2019-12-09 DIAGNOSIS — Z7982 Long term (current) use of aspirin: Secondary | ICD-10-CM | POA: Diagnosis not present

## 2019-12-09 DIAGNOSIS — J449 Chronic obstructive pulmonary disease, unspecified: Secondary | ICD-10-CM | POA: Diagnosis not present

## 2019-12-09 DIAGNOSIS — R634 Abnormal weight loss: Secondary | ICD-10-CM | POA: Insufficient documentation

## 2019-12-09 MED ORDER — DARATUMUMAB-HYALURONIDASE-FIHJ 1800-30000 MG-UT/15ML ~~LOC~~ SOLN
1800.0000 mg | Freq: Once | SUBCUTANEOUS | Status: AC
  Administered 2019-12-09: 1800 mg via SUBCUTANEOUS
  Filled 2019-12-09: qty 15

## 2019-12-09 MED ORDER — DIPHENHYDRAMINE HCL 25 MG PO CAPS
50.0000 mg | ORAL_CAPSULE | Freq: Once | ORAL | Status: AC
  Administered 2019-12-09: 25 mg via ORAL
  Filled 2019-12-09: qty 2

## 2019-12-09 MED ORDER — DEXAMETHASONE 4 MG PO TABS
10.0000 mg | ORAL_TABLET | Freq: Once | ORAL | Status: AC
  Administered 2019-12-09: 10 mg via ORAL
  Filled 2019-12-09: qty 3

## 2019-12-09 MED ORDER — CYANOCOBALAMIN 1000 MCG/ML IJ SOLN
1000.0000 ug | Freq: Once | INTRAMUSCULAR | Status: AC
  Administered 2019-12-09: 1000 ug via INTRAMUSCULAR
  Filled 2019-12-09: qty 1

## 2019-12-09 MED ORDER — ACETAMINOPHEN 325 MG PO TABS
650.0000 mg | ORAL_TABLET | Freq: Once | ORAL | Status: AC
  Administered 2019-12-09: 650 mg via ORAL
  Filled 2019-12-09: qty 2

## 2019-12-09 NOTE — Patient Instructions (Signed)
[facility] Discharge Instructions for Patients Receiving Chemotherapy  Today you received the following chemotherapy agents   To help prevent nausea and vomiting after your treatment, we encourage you to take your nausea medication   If you develop nausea and vomiting that is not controlled by your nausea medication, call the clinic.   BELOW ARE SYMPTOMS THAT SHOULD BE REPORTED IMMEDIATELY:  *FEVER GREATER THAN 100.5 F  *CHILLS WITH OR WITHOUT FEVER  NAUSEA AND VOMITING THAT IS NOT CONTROLLED WITH YOUR NAUSEA MEDICATION  *UNUSUAL SHORTNESS OF BREATH  *UNUSUAL BRUISING OR BLEEDING  TENDERNESS IN MOUTH AND THROAT WITH OR WITHOUT PRESENCE OF ULCERS  *URINARY PROBLEMS  *BOWEL PROBLEMS  UNUSUAL RASH Items with * indicate a potential emergency and should be followed up as soon as possible.  Feel free to call the clinic should you have any questions or concerns. The clinic phone number is [phone].  Please show the CHEMO ALERT CARD at check-in to the Emergency Department and triage nurse.   

## 2019-12-09 NOTE — Progress Notes (Signed)
Labs reviewed with MD today. Will give Darzalex injection only , no zometa per MD. Creatinine 1.56 noted.  Treatment given per orders. Patient tolerated it well without problems. Vitals stable and discharged home from clinic ambulatory. Follow up as scheduled.

## 2019-12-11 ENCOUNTER — Other Ambulatory Visit: Payer: Self-pay | Admitting: *Deleted

## 2019-12-11 ENCOUNTER — Other Ambulatory Visit: Payer: Self-pay

## 2019-12-11 ENCOUNTER — Ambulatory Visit (HOSPITAL_COMMUNITY)
Admission: RE | Admit: 2019-12-11 | Discharge: 2019-12-11 | Disposition: A | Discharge status: Home / Self Care | Payer: Medicare Other | Source: Ambulatory Visit | Attending: Otolaryngology | Admitting: Otolaryngology

## 2019-12-11 DIAGNOSIS — H918X9 Other specified hearing loss, unspecified ear: Secondary | ICD-10-CM | POA: Diagnosis present

## 2019-12-11 MED ORDER — GADOBUTROL 1 MMOL/ML IV SOLN
7.0000 mL | Freq: Once | INTRAVENOUS | Status: AC | PRN
  Administered 2019-12-11: 7 mL via INTRAVENOUS

## 2019-12-11 NOTE — Patient Outreach (Signed)
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 [facility] RN CM left HIPAA [facility] Portability and Accountability Act) compliant voicemail message along with CM's contact info.     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 She previously lived alone. She is needing assistance with all care needs and transportation at this time.  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, RN, BSN, [place] Coordinator Office number [phone] Mobile number [phone]  Main [facility] number [phone] Fax number [phone]

## 2019-12-12 ENCOUNTER — Inpatient Hospital Stay (HOSPITAL_COMMUNITY): Payer: Medicare Other

## 2019-12-12 DIAGNOSIS — C9000 Multiple myeloma not having achieved remission: Secondary | ICD-10-CM

## 2019-12-12 DIAGNOSIS — Z5112 Encounter for antineoplastic immunotherapy: Secondary | ICD-10-CM | POA: Diagnosis not present

## 2019-12-12 LAB — CBC WITH DIFFERENTIAL/PLATELET
Abs Immature Granulocytes: 0.03 10*3/uL (ref 0.00–0.07)
Basophils Absolute: 0 10*3/uL (ref 0.0–0.1)
Basophils Relative: 0 %
Eosinophils Absolute: 0 10*3/uL (ref 0.0–0.5)
Eosinophils Relative: 0 %
HCT: 39 % (ref 36.0–46.0)
Hemoglobin: 12.2 g/dL (ref 12.0–15.0)
Immature Granulocytes: 0 %
Lymphocytes Relative: 16 %
Lymphs Abs: 1.5 10*3/uL (ref 0.7–4.0)
MCH: 29.5 pg (ref 26.0–34.0)
MCHC: 31.3 g/dL (ref 30.0–36.0)
MCV: 94.2 fL (ref 80.0–100.0)
Monocytes Absolute: 1.5 10*3/uL — ABNORMAL HIGH (ref 0.1–1.0)
Monocytes Relative: 16 %
Neutro Abs: 6.5 10*3/uL (ref 1.7–7.7)
Neutrophils Relative %: 68 %
Platelets: 229 10*3/uL (ref 150–400)
RBC: 4.14 MIL/uL (ref 3.87–5.11)
RDW: 13.5 % (ref 11.5–15.5)
WBC: 9.6 10*3/uL (ref 4.0–10.5)
nRBC: 0 % (ref 0.0–0.2)

## 2019-12-12 LAB — COMPREHENSIVE METABOLIC PANEL
ALT: 30 U/L (ref 0–44)
AST: 31 U/L (ref 15–41)
Albumin: 3.7 g/dL (ref 3.5–5.0)
Alkaline Phosphatase: 109 U/L (ref 38–126)
Anion gap: 10 (ref 5–15)
BUN: 35 mg/dL — ABNORMAL HIGH (ref 8–23)
CO2: 28 mmol/L (ref 22–32)
Calcium: 8.8 mg/dL — ABNORMAL LOW (ref 8.9–10.3)
Chloride: 105 mmol/L (ref 98–111)
Creatinine, Ser: 1.46 mg/dL — ABNORMAL HIGH (ref 0.44–1.00)
GFR calc Af Amer: 39 mL/min — ABNORMAL LOW (ref >60)
GFR calc non Af Amer: 34 mL/min — ABNORMAL LOW (ref >60)
Glucose, Bld: 103 mg/dL — ABNORMAL HIGH (ref 70–99)
Potassium: 3.6 mmol/L (ref 3.5–5.1)
Sodium: 143 mmol/L (ref 135–145)
Total Bilirubin: 0.4 mg/dL (ref 0.3–1.2)
Total Protein: 6.3 g/dL — ABNORMAL LOW (ref 6.5–8.1)

## 2019-12-12 LAB — LACTATE DEHYDROGENASE: LDH: 169 U/L (ref 98–192)

## 2019-12-15 ENCOUNTER — Inpatient Hospital Stay (HOSPITAL_COMMUNITY): Payer: Medicare Other

## 2019-12-15 LAB — KAPPA/LAMBDA LIGHT CHAINS
Kappa free light chain: 93.2 mg/L — ABNORMAL HIGH (ref 3.3–19.4)
Kappa, lambda light chain ratio: 10.96 — ABNORMAL HIGH (ref 0.26–1.65)
Lambda free light chains: 8.5 mg/L (ref 5.7–26.3)

## 2019-12-15 LAB — PROTEIN ELECTROPHORESIS, SERUM
A/G Ratio: 1.3 (ref 0.7–1.7)
Albumin ELP: 3.4 g/dL (ref 2.9–4.4)
Alpha-1-Globulin: 0.2 g/dL (ref 0.0–0.4)
Alpha-2-Globulin: 0.8 g/dL (ref 0.4–1.0)
Beta Globulin: 0.8 g/dL (ref 0.7–1.3)
Gamma Globulin: 0.8 g/dL (ref 0.4–1.8)
Globulin, Total: 2.6 g/dL (ref 2.2–3.9)
M-Spike, %: 0.6 g/dL — ABNORMAL HIGH
Total Protein ELP: 6 g/dL (ref 6.0–8.5)

## 2019-12-16 ENCOUNTER — Inpatient Hospital Stay (HOSPITAL_COMMUNITY): Payer: Medicare Other

## 2019-12-16 ENCOUNTER — Other Ambulatory Visit: Payer: Self-pay

## 2019-12-16 ENCOUNTER — Inpatient Hospital Stay (HOSPITAL_BASED_OUTPATIENT_CLINIC_OR_DEPARTMENT_OTHER): Payer: Medicare Other | Admitting: Hematology

## 2019-12-16 VITALS — BP 123/46 | HR 65 | Temp 97.3°F | Resp 18

## 2019-12-16 VITALS — BP 113/50 | HR 80 | Temp 96.9°F | Resp 19 | Wt 162.0 lb

## 2019-12-16 DIAGNOSIS — D509 Iron deficiency anemia, unspecified: Secondary | ICD-10-CM

## 2019-12-16 DIAGNOSIS — N183 Chronic kidney disease, stage 3 unspecified: Secondary | ICD-10-CM

## 2019-12-16 DIAGNOSIS — E538 Deficiency of other specified B group vitamins: Secondary | ICD-10-CM

## 2019-12-16 DIAGNOSIS — C9000 Multiple myeloma not having achieved remission: Secondary | ICD-10-CM

## 2019-12-16 DIAGNOSIS — Z5112 Encounter for antineoplastic immunotherapy: Secondary | ICD-10-CM | POA: Diagnosis not present

## 2019-12-16 MED ORDER — DEXAMETHASONE 4 MG PO TABS
10.0000 mg | ORAL_TABLET | Freq: Once | ORAL | Status: AC
  Administered 2019-12-16: 10 mg via ORAL

## 2019-12-16 MED ORDER — ACETAMINOPHEN 325 MG PO TABS: 650.0000 mg | ORAL_TABLET | Freq: Once | ORAL | Status: DC

## 2019-12-16 MED ORDER — SODIUM CHLORIDE 0.9 % IV SOLN: Freq: Once | INTRAVENOUS | Status: AC

## 2019-12-16 MED ORDER — DIPHENHYDRAMINE HCL 25 MG PO CAPS: 50.0000 mg | ORAL_CAPSULE | Freq: Once | ORAL | Status: DC

## 2019-12-16 MED ORDER — DARATUMUMAB-HYALURONIDASE-FIHJ 1800-30000 MG-UT/15ML ~~LOC~~ SOLN
1800.0000 mg | Freq: Once | SUBCUTANEOUS | Status: AC
  Administered 2019-12-16: 1800 mg via SUBCUTANEOUS
  Filled 2019-12-16: qty 15

## 2019-12-16 MED ORDER — ZOLEDRONIC ACID 4 MG/5ML IV CONC
3.0000 mg | Freq: Once | INTRAVENOUS | Status: AC
  Administered 2019-12-16: 3 mg via INTRAVENOUS
  Filled 2019-12-16: qty 3.75

## 2019-12-16 MED ORDER — DEXAMETHASONE 4 MG PO TABS
ORAL_TABLET | ORAL | Status: AC
  Filled 2019-12-16: qty 3

## 2019-12-16 NOTE — Patient Instructions (Signed)
[facility] Discharge Instructions for Patients Receiving Chemotherapy  Today you received the following chemotherapy agents   To help prevent nausea and vomiting after your treatment, we encourage you to take your nausea medication   If you develop nausea and vomiting that is not controlled by your nausea medication, call the clinic.   BELOW ARE SYMPTOMS THAT SHOULD BE REPORTED IMMEDIATELY:  *FEVER GREATER THAN 100.5 F  *CHILLS WITH OR WITHOUT FEVER  NAUSEA AND VOMITING THAT IS NOT CONTROLLED WITH YOUR NAUSEA MEDICATION  *UNUSUAL SHORTNESS OF BREATH  *UNUSUAL BRUISING OR BLEEDING  TENDERNESS IN MOUTH AND THROAT WITH OR WITHOUT PRESENCE OF ULCERS  *URINARY PROBLEMS  *BOWEL PROBLEMS  UNUSUAL RASH Items with * indicate a potential emergency and should be followed up as soon as possible.  Feel free to call the clinic should you have any questions or concerns. The clinic phone number is [phone].  Please show the CHEMO ALERT CARD at check-in to the Emergency Department and triage nurse.   

## 2019-12-16 NOTE — Progress Notes (Signed)
Patient has been assessed, vital signs and labs have been reviewed by Dr. Katragadda. ANC, Creatinine, LFTs, and Platelets are within treatment parameters per Dr. Katragadda. The patient is good to proceed with treatment at this time.  

## 2019-12-16 NOTE — Progress Notes (Signed)
Patient presents today for treatment and follow up visit with Dr. Delton Coombes. Labs reviewed. Patient has no complaints of any changes since her last visit. Creatinine today 1.46.   Message received from [facility] LPN to proceed with treatment today.   Message received from [facility] LPN/ Dr. Delton Coombes. Patient may receive Zometa today. 3 mg IV.   Treatment given today per MD orders. Tolerated without adverse affects. Vital signs stable. No complaints at this time. Discharged from clinic ambulatory. F/U with [facility] as scheduled.

## 2019-12-16 NOTE — Progress Notes (Signed)
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    03/18/2015 - 07/28/2015 Chemotherapy   Velcade 1.6 mg/m2 discontinued secondary to intolerance   07/28/2015 Adverse Reaction   stool incontinence, weakness, collapse upon standing, felt to be secondary to velcade   11/09/2015 - 12/13/2015 Chemotherapy   cytoxan IV 300 mg/m2 administered X 2 doses only in addition to rev/dex   11/09/2015 - 06/08/2016 Chemotherapy   Revlimid/Dexamethasone    02/05/2017 - 07/09/2017 Chemotherapy   The patient had bortezomib SQ (VELCADE) chemo injection 1.75 mg, 1 mg/m2 = 1.75 mg (66.7 % of original dose 1.5 mg/m2), Subcutaneous,  Once, 1 of 8 cycles Dose modification: 1.5 mg/m2 (original dose 1.5 mg/m2, Cycle 1, Reason: Provider Judgment, Comment: Per Dr. Norma Fredrickson recommendations from [facility] 1 mg/m2 (original dose 1.5 mg/m2, Cycle 1, Reason: Provider Judgment)  for chemotherapy treatment.     07/09/2017 Adverse Reaction   Diarrhea    Chemotherapy   Pomalyst 2 mg (Days 1-21 every 28 days), Ixazomib 2.3 mg (days 1, 8, 15 every 28 days), and Dexamethasone 10 mg (weekly)- Rx's printed on 08/24/2017.  Treatment recommendations from Dr. Norma Fredrickson at [facility].   07/28/2019 -  Chemotherapy   The patient had daratumumab-hyaluronidase-fihj (DARZALEX FASPRO) 1800-30000 MG-UT/15ML chemo SQ injection 1,800 mg, 1,800 mg, Subcutaneous,  Once, 2 of 10 cycles Administration: 1,800 mg (07/28/2019), 1,800 mg (08/04/2019), 1,800 mg (08/11/2019), 1,800 mg  (08/25/2019), 1,800 mg (12/09/2019)  for chemotherapy treatment.      CANCER STAGING: Cancer Staging No matching staging information was found for the patient.  INTERVAL HISTORY:  Carol Bradley, a 79 y.o. female, returns for routine follow-up and consideration for next cycle of chemotherapy. Carol Bradley was last seen on 12/03/2019.  Due for day #8 of cycle #2 of Darzalex today.   She reports that she had no issues after the prior treatment. She went to see Dr. Benjamine Mola for her bilat earaches which are worse during the night than during the day and had an MRI done which showed that she suffered a stroke. She takes ASA 81 mg daily. Overall, she tells me she has been feeling pretty well. She denies having any weakness anywhere. She is not taking anything for her appetite and is drinking 2 Ensures daily.  Overall, she feels ready for next cycle of chemo today.    REVIEW OF SYSTEMS:  Review of Systems  Constitutional: Positive for appetite change (mildly decreased) and fatigue (moderate).  HENT:         Bilat otalgia  Cardiovascular: Negative for leg swelling.  Gastrointestinal: Negative for abdominal pain.  Neurological: Positive for dizziness, headaches and numbness (feet).  Psychiatric/Behavioral: Positive for sleep disturbance.  All other systems reviewed and are negative.   PAST MEDICAL/SURGICAL HISTORY:  Past Medical History:  Diagnosis Date  . 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 CAD (coronary artery disease)   . COPD (chronic obstructive pulmonary disease) ([place])   . History of tobacco abuse   . Hyperlipidemia   . Hypertension   .  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 Multiple myeloma ([place])   . Vitamin B12 deficiency 04/15/2016   Overview:  Vitamin B12 level documented 155, January 2016 with the normal range being 211-924   Past Surgical History:  Procedure Laterality Date  . BACK SURGERY    . CARDIAC CATHETERIZATION  04/2011   right and left  cath showing normal right heart pressures,but newly diagnosed coronary artery disease/drug eluting stent placed to RCA with residual disease in the proximal RCA and LAD and ramus, normal LV function and 60-65% EF  . COLONOSCOPY N/A 09/30/2014   Procedure: COLONOSCOPY;  Surgeon: Rogene Houston, MD;  [facility];  Service: Endoscopy;  Laterality: N/A;  225  . CORONARY ANGIOPLASTY WITH STENT PLACEMENT  04/2011   mid RCA: 3.0 X38 mm Promus DES. Residual 40% disease proximally  . ESOPHAGOGASTRODUODENOSCOPY N/A 09/10/2019   Procedure: ESOPHAGOGASTRODUODENOSCOPY (EGD);  Surgeon: Clarene Essex, MD;  Location: [facility];  Service: Endoscopy;  Laterality: N/A;  . NECK SURGERY      SOCIAL HISTORY:  Social History   Socioeconomic History  . Marital status: Widowed    Spouse name: Not on file  . Number of children: Not on file  . Years of education: Not on file  . Highest education level: Not on file  Occupational History  . 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 Smoking status: Former Smoker    Packs/day: 3.00    Years: 25.00    Pack years: 75.00    Types: Cigarettes    Quit date: 07/10/1994    Years since quitting: 25.4  . Smokeless tobacco: Never Used  Substance and Sexual Activity  . Alcohol use: No  . Drug use: Never  . Sexual activity: Not on file  Other Topics Concern  . Not on file  Social History Narrative  . Not on file   Social Determinants of Health   Financial Resource Strain:   . Difficulty of Paying Living Expenses:   Food Insecurity: No Food Insecurity  . Worried About Charity fundraiser in the Last Year: Never true  . Ran Out of Food in the Last Year: Never true  Transportation Needs: No Transportation Needs  . Lack of Transportation (Medical): No  . Lack of Transportation (Non-Medical): No  Physical Activity:   . Days of Exercise per Week:   . Minutes of Exercise per Session:   Stress:   . Feeling of Stress :   Social  Connections:   . Frequency of Communication with Friends and Family:   . Frequency of Social Gatherings with Friends and Family:   . Attends Religious Services:   . Active Member of Clubs or Organizations:   . Attends Archivist Meetings:   Marland Kitchen Marital Status:   Intimate Partner Violence:   . Fear of Current or Ex-Partner:   . Emotionally Abused:   Marland Kitchen Physically Abused:   . Sexually Abused:     FAMILY HISTORY:  Family History  Problem Relation Age of Onset  . Heart failure Mother   . Cancer Father     CURRENT MEDICATIONS:  Current Outpatient Medications  Medication Sig Dispense Refill  . albuterol (VENTOLIN HFA) 108 (90 Base) MCG/ACT inhaler Inhale 2 puffs into the lungs every 6 (six) hours as needed for wheezing or shortness of breath. (Patient not taking: Reported on 12/03/2019) 18 g 2  . aspirin EC 81 MG tablet Take 1 tablet (81 mg total) by mouth  daily. 90 tablet 3  . atorvastatin (LIPITOR) 10 MG tablet TAKE ONE TABLET BY MOUTH DAILY 90 tablet 3  . bismuth-metronidazole-tetracycline (PYLERA) 140-125-125 MG capsule Take 3 capsules by mouth 4 (four) times daily -  before meals and at bedtime. 120 capsule 0  . Calcium Carbonate-Vit D-Min (CALCIUM 600+D PLUS MINERALS) 600-400 MG-UNIT CHEW Chew 1 tablet by mouth 3 (three) times daily.     . cyanocobalamin (,VITAMIN B-12,) 1000 MCG/ML injection Inject 1,000 mcg into the skin every 30 (thirty) days.     Marland Kitchen dexamethasone (DECADRON) 2 MG tablet Take five tablets (74m) by mouth weekly. 20 tablet 2  . diazepam (VALIUM) 5 MG tablet TAKE ONE TABLET BY MOUTH EVERY 6 HOURS AS NEEDED FOR ANXIETY (Patient not taking: Reported on 12/03/2019) 30 tablet 1  . diltiazem (CARDIZEM CD) 240 MG 24 hr capsule Take 1 capsule (240 mg total) by mouth daily. 90 capsule 3  . famotidine (PEPCID) 20 MG tablet Take 20 mg by mouth 2 (two) times daily as needed.    . fluticasone (FLONASE) 50 MCG/ACT nasal spray Place 2 sprays into both nostrils as needed.       . furosemide (LASIX) 20 MG tablet Take 40 mg by mouth daily.     .Marland Kitchenipratropium (ATROVENT) 0.06 % nasal spray SMARTSIG:2 Spray(s) Both Nares Twice Daily PRN    . magnesium oxide (MAG-OX) 400 (241.3 Mg) MG tablet Take 1 tablet by mouth 2 (two) times daily.    . metoprolol tartrate (LOPRESSOR) 25 MG tablet TAKE 1 AND 1/2 TABLETS BY MOUTH TWICE DAILY. 75 tablet 3  . metroNIDAZOLE (FLAGYL) 500 MG tablet Take 500 mg by mouth 3 (three) times daily.    . ondansetron (ZOFRAN) 8 MG tablet Take 1 tablet (8 mg total) by mouth every 8 (eight) hours as needed for nausea or vomiting. (Patient not taking: Reported on 12/03/2019) 20 tablet 0  . pantoprazole (PROTONIX) 40 MG tablet Take 1 tablet (40 mg total) by mouth 2 (two) times daily. 60 tablet 0  . polyethylene glycol powder (GLYCOLAX/MIRALAX) 17 GM/SCOOP powder Take by mouth as needed.     . potassium chloride (KLOR-CON) 10 MEQ tablet TAKE ONE TABLET BY MOUTH THREE TIMES DAILY. 90 tablet 4  . predniSONE (STERAPRED UNI-PAK 21 TAB) 10 MG (21) TBPK tablet TAKE AS DIRECTED PER PACKAGE INSTRUCTIONS.    .Marland Kitchentetracycline (SUMYCIN) 500 MG capsule Take 500 mg by mouth 2 (two) times daily.     .Marland Kitchenumeclidinium-vilanterol (ANORO ELLIPTA) 62.5-25 MCG/INH AEPB Inhale 1 puff into the lungs daily. (Patient not taking: Reported on 12/03/2019) 60 each 1   No current facility-administered medications for this visit.    ALLERGIES:  Allergies  Allergen Reactions  . Lisinopril Cough  . Plavix [Clopidogrel Bisulfate] Swelling    PHYSICAL EXAM:  Performance status (ECOG): 1 - Symptomatic but completely ambulatory  There were no vitals filed for this visit. Wt Readings from Last 3 Encounters:  12/03/19 162 lb 8 oz (73.7 kg)  11/25/19 160 lb 8 oz (72.8 kg)  11/13/19 165 lb (74.8 kg)   Physical Exam Vitals reviewed.  Constitutional:      Appearance: Normal appearance.  Cardiovascular:     Rate and Rhythm: Normal rate and regular rhythm.     Pulses: Normal pulses.      Heart sounds: Normal heart sounds.  Pulmonary:     Effort: Pulmonary effort is normal.     Breath sounds: Normal breath sounds.  Musculoskeletal:     Right  lower leg: No edema.     Left lower leg: No edema.  Neurological:     General: No focal deficit present.     Mental Status: She is alert and oriented to person, place, and time.  Psychiatric:        Mood and Affect: Mood normal.        Behavior: Behavior normal.     LABORATORY DATA:  I have reviewed the labs as listed.  CBC Latest Ref Rng & Units 12/12/2019 12/03/2019 11/13/2019  WBC 4.0 - 10.5 K/uL 9.6 6.2 6.5  Hemoglobin 12.0 - 15.0 g/dL 12.2 11.2(L) 11.7(L)  Hematocrit 36.0 - 46.0 % 39.0 36.0 37.6  Platelets 150 - 400 K/uL 229 238 188   CMP Latest Ref Rng & Units 12/12/2019 12/03/2019 11/13/2019  Glucose 70 - 99 mg/dL 103(H) 112(H) 110(H)  BUN 8 - 23 mg/dL 35(H) 19 15  Creatinine 0.44 - 1.00 mg/dL 1.46(H) 1.56(H) 1.24(H)  Sodium 135 - 145 mmol/L 143 139 140  Potassium 3.5 - 5.1 mmol/L 3.6 3.9 3.7  Chloride 98 - 111 mmol/L 105 104 105  CO2 22 - 32 mmol/L _0 Calcium 8.9 - 10.3 mg/dL 8.8(L) 8.7(L) 8.8(L)  Total Protein 6.5 - 8.1 g/dL 6.3(L) 6.1(L) 6.3(L)  Total Bilirubin 0.3 - 1.2 mg/dL 0.4 0.7 0.5  Alkaline Phos 38 - 126 U/L 109 100 193(H)  AST 15 - 41 U/L 31 26 52(H)  ALT 0 - 44 U/L 30 17 62(H)    DIAGNOSTIC IMAGING:  I have independently reviewed the scans and discussed with the patient. MR BRAIN W WO CONTRAST  Result Date: 12/12/2019 CLINICAL DATA:  79 year old female with bilateral ringing in ears for 2 months. No known injury. Multiple myeloma. EXAM: MRI HEAD WITHOUT AND WITH CONTRAST TECHNIQUE: Multiplanar, multiecho pulse sequences of the brain and surrounding structures were obtained without and with intravenous contrast. CONTRAST:  36m GADAVIST GADOBUTROL 1 MMOL/ML IV SOLN COMPARISON:  Orbit MRI 03/21/2019.  Face CT 09/13/2018. FINDINGS: Brain: There are several foci of developing encephalomalacia in the posterior  right hemisphere, with laminar necrosis. These are at the junction of the inferior right parietal, posterior right temporal and right occipital lobes (series 6, image 24). There is some residual abnormal trace diffusion here, although on ADC the areas are mostly facilitated. This area was only partially included on the September comparison (only on series 3 of that exam), but appeared normal at that time. Intrinsic T1 hyperintensity with no definite abnormal enhancement. No other abnormal diffusion. No other cortical encephalomalacia. Although there is patchy bilateral cerebral white matter T2 and FLAIR hyperintensity. No chronic cerebral blood products identified. No midline shift, mass effect, evidence of mass lesion, ventriculomegaly, extra-axial collection or acute intracranial hemorrhage. Cervicomedullary junction and pituitary are within normal limits. No abnormal enhancement or dural thickening. Vascular: Major intracranial vascular flow voids are preserved. Skull and upper cervical spine: Partially visible cervical spine degeneration. Stable visible bone marrow signal. No destructive skull lesion identified. Degenerative bilateral TMJ joint effusions, greater on the right. Sinuses/Orbits: Stable compared to September, negative. Other: Dedicated internal auditory imaging. Normal cerebellopontine angles. Normal bilateral cisternal and intracanalicular 7th and 8th cranial nerve segments. Symmetric T2 signal in the cochlea and vestibular structures. Trace fluid in the lateral left mastoid air cells is stable since September and likely postinflammatory. The mastoids otherwise remain clear. Negative nasopharynx. No abnormal enhancement identified. Normal stylomastoid foramina. No skull base abnormality identified. Visible parotid glands appear normal. IMPRESSION: 1. Positive for a  late subacute to early chronic infarct in the posterior right hemisphere, new since September. This is in the Right MCA/PCA watershed  area, with developing encephalomalacia and laminar necrosis. No associated hemorrhage or mass effect. 2. Negative internal auditory imaging.  No explanation for tinnitus. 3. Otherwise mild to moderate for age cerebral white matter signal changes, nonspecific but most commonly due to chronic small vessel disease. Electronically Signed   By: Genevie Ann M.D.   On: 12/12/2019 08:58   US Abdomen Complete  Result Date: 11/27/2019 CLINICAL DATA:  Elevated liver function tests. EXAM: ABDOMEN ULTRASOUND COMPLETE COMPARISON:  October 07, 2014. FINDINGS: Gallbladder: No gallstones or wall thickening visualized. No sonographic Murphy sign noted by sonographer. Sludge is noted within gallbladder lumen. Common bile duct: Diameter: 7 mm which is within normal limits. Liver: No focal lesion identified. Increased echogenicity of hepatic parenchyma is noted suggesting hepatic steatosis or other diffuse hepatocellular disease. Portal vein is patent on color Doppler imaging with normal direction of blood flow towards the liver. IVC: No abnormality visualized. Pancreas: Visualized portion unremarkable. Spleen: Size and appearance within normal limits. Right Kidney: Length: 9.6 cm. Echogenicity within normal limits. No mass or hydronephrosis visualized. Left Kidney: Length: 10.7 cm. Echogenicity within normal limits. Large bilobed cyst measuring 10.3 cm is noted. No mass or hydronephrosis visualized. Abdominal aorta: No aneurysm visualized. Other findings: None. IMPRESSION: Increased echogenicity of hepatic parenchyma is noted suggesting hepatic steatosis or other diffuse hepatocellular disease. Mild amount of sludge is noted within gallbladder lumen. 10.3 cm bilobed cyst is noted in left kidney. Electronically Signed   By: Marijo Conception M.D.   On: 11/27/2019 12:19     ASSESSMENT:  1.  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 -Hospitalized with severe anemia, weakness and pneumonia and shortness of breath on 09/05/2019 through 09/15/2019. -Myeloma labs from 12/12/2019 shows M spike increased to 0.6 g.  Kappa light chains increased to 93 and ratio increased to 10.96.  2.  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 -H. pylori antibody was positive.  Dr. Laural Golden is starting her on antibiotics. -Ultrasound of the abdomen on 11/27/2019 showed increased echogenicity of hepatic parenchyma consistent with steatosis.  Mild amount of sludge noted in the gallbladder.  10.3 cm bilobed cyst noted in the left kidney.   PLAN:  1.  IgG kappa multiple myeloma: -Darzalex was restarted back on 12/09/2019.  She is tolerated it very well. -She could not sleep that night because of dexamethasone. -I have reviewed her labs today.  We will continue weekly daratumumab with dexamethasone. -I plan to see her back in 4 weeks.  I plan to repeat myeloma panel in 3 weeks.  2.  Weight loss: -Continue Marinol 10 mg twice daily.  3.  Myeloma bone disease: -We will continue Zometa every 3 months.  Continue calcium and vitamin D.  4.  CKD: -Continue Lasix 40 mg in the morning and 20 mg in the evening. -Creatinine today is 1.46 and at baseline.  5.  Normocytic anemia: -Hemoglobin today stable at 12.2.   Orders placed this encounter:  No orders of the defined types were placed in this encounter.    Derek Jack, MD [facility] [phone]   I, Milinda Antis, am acting as a scribe for Dr. Sanda Linger.  I, Derek Jack MD, have reviewed the above documentation for accuracy and completeness, and I  agree with the above.

## 2019-12-16 NOTE — Patient Instructions (Addendum)
[place] at [facility] Discharge Instructions  You were seen today by Dr. Delton Coombes. He went over your recent results. Please increase your dose of aspirin to 325 mg daily. You will receive a referral to see a neurologist. Please continue your weekly treatments. Dr. Delton Coombes will see you back in 4 weeks for labs and follow up.   Thank you for choosing [place] at [facility] to provide your oncology and hematology care.  To afford each patient quality time with our provider, please arrive at least 15 minutes before your scheduled appointment time.   If you have a lab appointment with the [facility] please come in thru the Main Entrance and check in at the main information desk  You need to re-schedule your appointment should you arrive 10 or more minutes late.  We strive to give you quality time with our providers, and arriving late affects you and other patients whose appointments are after yours.  Also, if you no show three or more times for appointments you may be dismissed from the clinic at the providers discretion.     Again, thank you for choosing [facility].  Our hope is that these requests will decrease the amount of time that you wait before being seen by our physicians.       _____________________________________________________________  Should you have questions after your visit to [facility], please contact our office at [phone] between the hours of 8:00 a.m. and 4:30 p.m.  Voicemails left after 4:00 p.m. will not be returned until the following business day.  For prescription refill requests, have your pharmacy contact our office and allow 72 hours.    Cancer Center Support Programs:   > Cancer Support Group  2nd Tuesday of the month 1pm-2pm, Journey Room

## 2019-12-17 ENCOUNTER — Encounter: Payer: Self-pay | Admitting: *Deleted

## 2019-12-17 ENCOUNTER — Other Ambulatory Visit: Payer: Self-pay | Admitting: *Deleted

## 2019-12-17 NOTE — Patient Outreach (Signed)
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, CAD, HTN, multiple myeloma, COPD, Chronic diastolic CHF, CKD stage 3  Discharged home with [facility] home health PT, OT, aide  She continues to be seen by her oncology, cardiology and gastroenterology medical staff  Outreach attempt #3 successful  Carol Bradley is able to verify HIPAA ([place] and Accountability Act) identifiers, date of birth (DOB) and address Reviewed and addressed referral to [facility] with patient  Complex care follow up  Carol Bradley reports to [facility] RN CM she is doing much better Audible increase breathing noted but she confirms she is generally shortness of breath (sob) at all times and this is normal for her  She reports recently she is working with Dr Laural Golden for GI symptoms She confirms she presently is on ana antibiotic and tolerating it well  She reports a recent MI was completed indicating "I has a recent small stroke" but I am okay She confirms she has been scheduled to see a neurologist at [facility]  She confirms [place] services were complete and she has improved with her independence in her home care needs  She continues to receive chemotherapy for myeloma a [place] Na without difficulty She reports her recent Hgb has been averaging 11.2  Wt in last 2 months 160-165 lbs on 12/16/19 she was 162 lbs with recommendation from Dr Laural Golden for ensure supplements  She agrees to follow up in 90-97 business days   [facility] RN CM  intervention sent EMMI via her listed e Mail in [place] for stroke and TIA overview HF, CKD, h pylori infection , anemia caused by low iron, COPD  Social: 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 She previously lived alone. She is needing assistance with all care needs and transportation at this time.  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,Hypertension (HTN),CAD, multiple myeloma, COPD, Chronic diastolic CHF, CKD stage 3, hypotension, hemorrhagic shock, hx of community acquired pneumonia, elevated troponin,former smoker  VQM:[id number]  Appointments Oncology services weekly until January 13 2020  01/13/20 f/with oncology Dr Delton Coombes 04/02/20 optometry Marcello Moores bull   [facility] RN CM outreach to Carol Kunath as agreed on today within the next 90-97  business days  Pt encouraged to return a call to [facility] RN CM prn Routed note to MD   [facility] CM Care Plan Problem One     Most Recent Value  Care Plan Problem One  knowledge deficit for major medical conditions CHF, HTn  Role Documenting the Problem One  Care Management Telephonic Coordinator  Care Plan for Problem One  Active  THN Long Term Goal   Over the next 90 days, patient will not be hospitalized for complications related to chronic illnesses  THN Long Term Goal Start Date  09/19/19  [facility] Long Term Goal Met Date  12/17/19  THN CM Short Term Goal #1   over the next 31 days patient will be  able to verbalize with outreach interventions to manage CHF at home  [facility] CM Short Term Goal #1 Start Date  09/19/19  Interventions for Short Term Goal #1  assessed for worsening symptoms, reviewed action plans sent EMMI via her listed e Mail in [place] for stroke and tia overview    [facility] CM Care Plan Problem Two     Most Recent Value  Care Plan Problem Two  knowledge deficit at home for cva, cancer, chf  Role Documenting the Problem Two  Care  Management Telephonic Coordinator  Care Plan for Problem Two  Active  Interventions for Problem Two Long Term Goal   assessed for worsening symptoms, reviewed action plans sent EMMI via her listed e Mail in Epic for stroke and tia overview  THN Long Term Goal  over the next 90 days patient will be able to verbalize with outreach interventions to manage chf, cva, cancer  THN Long Term Goal Start Date  12/17/19  [facility] CM Short Term Goal #1   Patient will be able to verbalize in the next 14 days  3 signs & symptoms to report to MD for increasing problems with cancer, chf , cva  THN CM Short Term Goal #1 Start Date  12/17/19  Interventions for Short Term Goal #2   assessed for worsening symptoms, reviewed action plans sent EMMI via her listed e Mail in Epic for stroke and tia overview      [place]. Lavina Hamman, RN, BSN, [place] Coordinator Office number [phone] Mobile number [phone]  Main THN number [phone] Fax number [phone]

## 2019-12-22 ENCOUNTER — Other Ambulatory Visit (HOSPITAL_COMMUNITY): Payer: Medicare Other

## 2019-12-23 ENCOUNTER — Encounter (HOSPITAL_COMMUNITY): Payer: Self-pay

## 2019-12-23 ENCOUNTER — Inpatient Hospital Stay (HOSPITAL_COMMUNITY): Payer: Medicare Other

## 2019-12-23 ENCOUNTER — Other Ambulatory Visit: Payer: Self-pay

## 2019-12-23 VITALS — BP 143/56 | HR 73 | Temp 97.3°F | Resp 20 | Wt 159.6 lb

## 2019-12-23 DIAGNOSIS — Z5112 Encounter for antineoplastic immunotherapy: Secondary | ICD-10-CM | POA: Diagnosis not present

## 2019-12-23 DIAGNOSIS — C9000 Multiple myeloma not having achieved remission: Secondary | ICD-10-CM

## 2019-12-23 LAB — CBC WITH DIFFERENTIAL/PLATELET
Abs Immature Granulocytes: 0.03 10*3/uL (ref 0.00–0.07)
Basophils Absolute: 0 10*3/uL (ref 0.0–0.1)
Basophils Relative: 1 %
Eosinophils Absolute: 0.1 10*3/uL (ref 0.0–0.5)
Eosinophils Relative: 1 %
HCT: 39.8 % (ref 36.0–46.0)
Hemoglobin: 12.4 g/dL (ref 12.0–15.0)
Immature Granulocytes: 0 %
Lymphocytes Relative: 13 %
Lymphs Abs: 1 10*3/uL (ref 0.7–4.0)
MCH: 29.5 pg (ref 26.0–34.0)
MCHC: 31.2 g/dL (ref 30.0–36.0)
MCV: 94.5 fL (ref 80.0–100.0)
Monocytes Absolute: 1.1 10*3/uL — ABNORMAL HIGH (ref 0.1–1.0)
Monocytes Relative: 14 %
Neutro Abs: 5.9 10*3/uL (ref 1.7–7.7)
Neutrophils Relative %: 71 %
Platelets: 217 10*3/uL (ref 150–400)
RBC: 4.21 MIL/uL (ref 3.87–5.11)
RDW: 13.7 % (ref 11.5–15.5)
WBC: 8.2 10*3/uL (ref 4.0–10.5)
nRBC: 0 % (ref 0.0–0.2)

## 2019-12-23 LAB — COMPREHENSIVE METABOLIC PANEL
ALT: 17 U/L (ref 0–44)
AST: 21 U/L (ref 15–41)
Albumin: 3.3 g/dL — ABNORMAL LOW (ref 3.5–5.0)
Alkaline Phosphatase: 95 U/L (ref 38–126)
Anion gap: 8 (ref 5–15)
BUN: 35 mg/dL — ABNORMAL HIGH (ref 8–23)
CO2: 24 mmol/L (ref 22–32)
Calcium: 9.1 mg/dL (ref 8.9–10.3)
Chloride: 107 mmol/L (ref 98–111)
Creatinine, Ser: 1.55 mg/dL — ABNORMAL HIGH (ref 0.44–1.00)
GFR calc Af Amer: 37 mL/min — ABNORMAL LOW (ref >60)
GFR calc non Af Amer: 32 mL/min — ABNORMAL LOW (ref >60)
Glucose, Bld: 128 mg/dL — ABNORMAL HIGH (ref 70–99)
Potassium: 4.3 mmol/L (ref 3.5–5.1)
Sodium: 139 mmol/L (ref 135–145)
Total Bilirubin: 0.7 mg/dL (ref 0.3–1.2)
Total Protein: 6.1 g/dL — ABNORMAL LOW (ref 6.5–8.1)

## 2019-12-23 MED ORDER — ACETAMINOPHEN 325 MG PO TABS: 650.0000 mg | ORAL_TABLET | Freq: Once | ORAL | Status: DC

## 2019-12-23 MED ORDER — DARATUMUMAB-HYALURONIDASE-FIHJ 1800-30000 MG-UT/15ML ~~LOC~~ SOLN
1800.0000 mg | Freq: Once | SUBCUTANEOUS | Status: AC
  Administered 2019-12-23: 1800 mg via SUBCUTANEOUS
  Filled 2019-12-23: qty 15

## 2019-12-23 MED ORDER — DEXAMETHASONE 4 MG PO TABS
10.0000 mg | ORAL_TABLET | Freq: Once | ORAL | Status: AC
  Administered 2019-12-23: 10 mg via ORAL
  Filled 2019-12-23: qty 3

## 2019-12-23 MED ORDER — DIPHENHYDRAMINE HCL 25 MG PO CAPS: 50.0000 mg | ORAL_CAPSULE | Freq: Once | ORAL | Status: DC

## 2019-12-23 NOTE — Progress Notes (Signed)
1410 Labs reviewed with Dr. Delton Coombes and pt approved Darzalex injection today per MD                                                      Carol Bradley tolerated Darzalex injection well without complaints or incident. Pt discharged self ambulatory in satisfactory condition

## 2019-12-23 NOTE — Patient Instructions (Signed)
[facility] Discharge Instructions for Patients Receiving Chemotherapy   Beginning January 23rd 2017 lab work for the Cancer Center will be done in the  Main lab at [facility] on 1st floor. If you have a lab appointment with the Cancer Center please come in thru the  Main Entrance and check in at the main information desk   Today you received the following chemotherapy agents Darzalex injection. Follow-up as scheduled  To help prevent nausea and vomiting after your treatment, we encourage you to take your nausea medication   If you develop nausea and vomiting, or diarrhea that is not controlled by your medication, call the clinic.  The clinic phone number is [phone]. Office hours are Monday-Friday 8:30am-5:00pm.  BELOW ARE SYMPTOMS THAT SHOULD BE REPORTED IMMEDIATELY:  *FEVER GREATER THAN 101.0 F  *CHILLS WITH OR WITHOUT FEVER  NAUSEA AND VOMITING THAT IS NOT CONTROLLED WITH YOUR NAUSEA MEDICATION  *UNUSUAL SHORTNESS OF BREATH  *UNUSUAL BRUISING OR BLEEDING  TENDERNESS IN MOUTH AND THROAT WITH OR WITHOUT PRESENCE OF ULCERS  *URINARY PROBLEMS  *BOWEL PROBLEMS  UNUSUAL RASH Items with * indicate a potential emergency and should be followed up as soon as possible. If you have an emergency after office hours please contact your primary care physician or go to the nearest emergency department.  Please call the clinic during office hours if you have any questions or concerns.   You may also contact the Patient Navigator at [phone] should you have any questions or need assistance in obtaining follow up care.      Resources For Cancer Patients and their Caregivers ? American Cancer Society: Can assist with transportation, wigs, general needs, runs Look Good Feel Better.        1-888-227-6333 ? Cancer Care: Provides financial assistance, online support groups, medication/co-pay assistance.  1-800-813-HOPE (4673) ? [facility] Assists [place] cancer patients and their families through emotional , educational and financial support.  336-427-4357 ? [place] DSS Where to apply for food stamps, Medicaid and utility assistance. 336-342-1394 ? [facility]: Transportation to medical appointments. 336-347-2287 ? Social Security Administration: May apply for disability if have a Stage IV cancer. 336-342-7796 1-800-772-1213 ? [place] Aging, Disability and Transit Services: Assists with nutrition, care and transit needs. 336-349-2343         

## 2019-12-24 ENCOUNTER — Other Ambulatory Visit: Payer: Self-pay | Admitting: Cardiology

## 2019-12-25 ENCOUNTER — Ambulatory Visit (INDEPENDENT_AMBULATORY_CARE_PROVIDER_SITE_OTHER): Payer: Medicare Other | Admitting: Neurology

## 2019-12-25 ENCOUNTER — Encounter: Payer: Self-pay | Admitting: Neurology

## 2019-12-25 VITALS — BP 129/62 | HR 64 | Ht 67.0 in | Wt 158.2 lb

## 2019-12-25 DIAGNOSIS — I2510 Atherosclerotic heart disease of native coronary artery without angina pectoris: Secondary | ICD-10-CM | POA: Diagnosis not present

## 2019-12-25 DIAGNOSIS — I63412 Cerebral infarction due to embolism of left middle cerebral artery: Secondary | ICD-10-CM | POA: Diagnosis not present

## 2019-12-25 NOTE — Progress Notes (Signed)
[facility] [address] [place]. [place] [address] [phone]       OFFICE CONSULT NOTE  [name] Date of Birth:  02/27/1941 Medical Record Number:  [id number]   Referring MD:  Derek Jack  Reason for Referral: Abnormal MRI HPI: Carol Bradley is a pleasant 79 year old Caucasian lady seen today for initial office consultation visit.  History is obtained from the patient and review of electronic medical records and I personally reviewed imaging films in PACS.  Patient was recently seen by Dr. 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  Patient denies any history of stroke in the form of weakness, numbness, and gait imbalance, slurred speech or vision loss.  She has no prior history of significant head injury with loss of consciousness, seizures, migraines or any significant neurological problems.  She denies history of atrial fibrillation palpitations syncope or cardiac issues.  On inquiry she admits to having some mild balance issues for the last couple of months she feels wobbly but she has had no falls.  She is anxious but walking slowly and carefully has done all right.  She is independent in activities of daily living.  She lives alone.  Her brother lives nearby and family visits her on the weekends.  She denies any memory difficulties.  She had an MRI scan done on 12/12/2019 which I personally reviewed shows late subacute to early chronic infarct in the right posterior MCA territory which was not noted on the previous MRI scan scan from September 2020.  She had 2D echo also done which showed normal ejection fraction 55 to 60%.  No intravascular extra cranial vascular imaging was done. She has no past history of stroke or family history of stroke.  Vascular risk factors include only hypertension hyperlipidemia  and age.  She was previously on aspirin 81 mg daily and since the MRI report this has been increased to 325 mg daily which is tolerating well without upset stomach, bruising or bleeding. ROS:   14 system review of systems is positive for balance issues.,  gait imbalance and all other systems negative  PMH:  Past Medical History:  Diagnosis Date  . 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 CAD (coronary artery disease)   . COPD (chronic obstructive pulmonary disease) ([place])   . History of tobacco abuse   . Hyperlipidemia   . Hypertension   . 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 Multiple myeloma ([place])   . Vitamin B12 deficiency 04/15/2016   Overview:  Vitamin B12 level documented 155, January 2016 with the normal range being 211-924    Social History:  Social History   Socioeconomic History  . Marital status: Widowed    Spouse name: Not on file  . Number of children: Not on file  . Years of education: Not on file  . Highest education level: Not on file  Occupational History  . 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 Smoking status: Former Smoker    Packs/day: 3.00    Years: 25.00    Pack years: 75.00    Types: Cigarettes    Quit date: 07/10/1994    Years since quitting: 25.4  . Smokeless tobacco: Never Used  Vaping Use  . Vaping Use: Never used  Substance and Sexual Activity  .  Alcohol use: No  . Drug use: Never  . Sexual activity: Not on file  Other Topics Concern  . Not on file  Social History Narrative  . Not on file   Social Determinants of Health   Financial Resource Strain:   . Difficulty of Paying Living Expenses:   Food Insecurity: No Food Insecurity  . Worried About Charity fundraiser in the Last Year: Never true  . Ran Out of Food in the Last Year: Never true  Transportation Needs: No Transportation Needs  . Lack of Transportation (Medical): No  . Lack of Transportation (Non-Medical): No   Physical Activity:   . Days of Exercise per Week:   . Minutes of Exercise per Session:   Stress:   . Feeling of Stress :   Social Connections:   . Frequency of Communication with Friends and Family:   . Frequency of Social Gatherings with Friends and Family:   . Attends Religious Services:   . Active Member of Clubs or Organizations:   . Attends Archivist Meetings:   Marland Kitchen Marital Status:   Intimate Partner Violence:   . Fear of Current or Ex-Partner:   . Emotionally Abused:   Mar[place] Kitchen Physically Abused:   . Sexually Abused:     Medications:   Current Outpatient Medications on File Prior to Visit  Medication Sig Dispense Refill  . albuterol (VENTOLIN HFA) 108 (90 Base) MCG/ACT inhaler Inhale 2 puffs into the lungs every 6 (six) hours as needed for wheezing or shortness of breath. 18 g 2  . aspirin EC 81 MG tablet Take 1 tablet (81 mg total) by mouth daily. (Patient taking differently: Take 325 mg by mouth daily. ) 90 tablet 3  . atorvastatin (LIPITOR) 10 MG tablet TAKE ONE TABLET BY MOUTH DAILY 90 tablet 1  . Calcium Carbonate-Vit D-Min (CALCIUM 600+D PLUS MINERALS) 600-400 MG-UNIT CHEW Chew 1 tablet by mouth 3 (three) times daily.     . cyanocobalamin (,VITAMIN B-12,) 1000 MCG/ML injection Inject 1,000 mcg into the skin every 30 (thirty) days.     . daratumumab-hyaluronidase-fihj (DARZALEX FASPRO) 1800-30000 MG-UT/15ML SOLN Inject 1,800 mg into the skin once.    Mar[place] Kitchen dexamethasone (DECADRON) 2 MG tablet Take five tablets ('10mg'$ ) by mouth weekly. 20 tablet 2  . diazepam (VALIUM) 5 MG tablet TAKE ONE TABLET BY MOUTH EVERY 6 HOURS AS NEEDED FOR ANXIETY 30 tablet 1  . diltiazem (CARDIZEM CD) 240 MG 24 hr capsule Take 1 capsule (240 mg total) by mouth daily. 90 capsule 3  . famotidine (PEPCID) 20 MG tablet Take 20 mg by mouth 2 (two) times daily as needed.    . fluticasone (FLONASE) 50 MCG/ACT nasal spray Place 2 sprays into both nostrils as needed.     . furosemide (LASIX) 20 MG tablet  Take 40 mg by mouth daily.     Marland Kitchen ipratropium (ATROVENT) 0.06 % nasal spray SMARTSIG:2 Spray(s) Both Nares Twice Daily PRN    . magnesium oxide (MAG-OX) 400 (241.3 Mg) MG tablet Take 1 tablet by mouth 2 (two) times daily.    . metoprolol tartrate (LOPRESSOR) 25 MG tablet TAKE 1 AND 1/2 TABLETS BY MOUTH TWICE DAILY. 75 tablet 3  . metroNIDAZOLE (FLAGYL) 500 MG tablet Take 500 mg by mouth 3 (three) times daily.    . ondansetron (ZOFRAN) 8 MG tablet Take 1 tablet (8 mg total) by mouth every 8 (eight) hours as needed for nausea or vomiting. 20 tablet 0  . pantoprazole (PROTONIX) 40  MG tablet Take 1 tablet (40 mg total) by mouth 2 (two) times daily. 60 tablet 0  . polyethylene glycol powder (GLYCOLAX/MIRALAX) 17 GM/SCOOP powder Take by mouth as needed.     . potassium chloride (KLOR-CON) 10 MEQ tablet TAKE ONE TABLET BY MOUTH THREE TIMES DAILY. 90 tablet 4  . tetracycline (SUMYCIN) 500 MG capsule Take 500 mg by mouth 2 (two) times daily.     Marland Kitchen umeclidinium-vilanterol (ANORO ELLIPTA) 62.5-25 MCG/INH AEPB Inhale 1 puff into the lungs daily. 60 each 1   No current facility-administered medications on file prior to visit.    Allergies:   Allergies  Allergen Reactions  . Lisinopril Cough  . Plavix [Clopidogrel Bisulfate] Swelling    Physical Exam General: Pleasant elderly Caucasian lady, seated, in no evident distress Head: head normocephalic and atraumatic.   Neck: supple with no carotid or supraclavicular bruits Cardiovascular: regular rate and rhythm, no murmurs Musculoskeletal: Mild kyphosis Skin:  no rash/petichiae Vascular:  Normal pulses all extremities  Neurologic Exam Mental Status: Awake and fully alert. Oriented to place and time. Recent and remote memory intact. Attention span, concentration and fund of knowledge appropriate. Mood and affect appropriate.  Diminished recall 1/3.  Able to name 12 animals which can walk on 4 legs Cranial Nerves: Fundoscopic exam reveals sharp disc  margins. Pupils equal, briskly reactive to light. Extraocular movements full without nystagmus. Visual fields full to confrontation. Hearing diminished bilaterally facial sensation intact. Face, tongue, palate moves normally and symmetrically.  Motor: Normal bulk and tone. Normal strength in all tested extremity muscles. Sensory.: intact to touch , pinprick , position and vibratory sensation.  Coordination: Rapid alternating movements normal in all extremities. Finger-to-nose and heel-to-shin performed accurately bilaterally. Gait and Station: Arises from chair without difficulty. Stance is normal. Gait demonstrates normal stride length and balance . Able to heel, toe and tandem walk with moderatet difficulty.  Reflexes: 1+ and symmetric. Toes downgoing.   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  No clinical symptoms of stroke.  Vascular risk factors of hypertension and hyperlipidemia and age     PLAN: I had a long discussion with the patient regarding her abnormal MRI scan showing a silent subacute to chronic right MCA branch infarct likely of embolic etiology.  She has no clinical symptoms which can be attributed to this but she remains at risk for recurrent strokes and so recommend further work-up with checking CT angiogram of the brain and neck, lipid profile, hemoglobin A1c and loop recorder to look for paroxysmal A. fib.  Continue aspirin for stroke prevention and maintain aggressive risk factor modification with strict control of hypertension with blood pressure goal below 130/90, lipids with LDL cholesterol goal below 70 mg percent and diabetes with hemoglobin A1c goal below 6.5%.  She was encouraged to eat a healthy diet with lots of fruits, vegetables, cereals and whole grains and to be active and exercise regularly.  Greater than 50% time during this  45-minute consultation visit were spent on counseling and coordination of care about her silent right MCA infarct and abnormal MRI and answering questions. she will return for follow-up in the future in 3 months or call earlier if necessary. Carol Contras, MD  [facility] [address] [place], [place] [address]  Phone [phone] Fax [phone] Note: This document was prepared with digital dictation and possible  smart Company secretary. Any transcriptional errors that result from this process are unintentional.

## 2019-12-25 NOTE — Patient Instructions (Signed)
I had a long discussion with the patient regarding her abnormal MRI scan showing a silent subacute to chronic right MCA branch infarct likely of embolic etiology.  She has no clinical symptoms which can be attributed to this but she remains at risk for recurrent strokes and so recommend further work-up with checking CT angiogram of the brain and neck, lipid profile, hemoglobin A1c and loop recorder to look for paroxysmal A. fib.  Continue aspirin for stroke prevention and maintain aggressive risk factor modification with strict control of hypertension with blood pressure goal below 130/90, lipids with LDL cholesterol goal below 70 mg percent and diabetes with hemoglobin A1c goal below 6.5%.  She was encouraged to eat a healthy diet with lots of fruits, vegetables, cereals and whole grains and to be active and exercise regularly.  She will return for follow-up in the future in 3 months or call earlier if necessary.  Stroke Prevention Some medical conditions and behaviors are associated with a higher chance of having a stroke. You can help prevent a stroke by making nutrition, lifestyle, and other changes, including managing any medical conditions you may have. What nutrition changes can be made?   Eat healthy foods. You can do this by: ? Choosing foods high in fiber, such as fresh fruits and vegetables and whole grains. ? Eating at least 5 or more servings of fruits and vegetables a day. Try to fill half of your plate at each meal with fruits and vegetables. ? Choosing lean protein foods, such as lean cuts of meat, poultry without skin, fish, tofu, beans, and nuts. ? Eating low-fat dairy products. ? Avoiding foods that are high in salt (sodium). This can help lower blood pressure. ? Avoiding foods that have saturated fat, trans fat, and cholesterol. This can help prevent high cholesterol. ? Avoiding processed and premade foods.  Follow your health care provider's specific guidelines for losing weight,  controlling high blood pressure (hypertension), lowering high cholesterol, and managing diabetes. These may include: ? Reducing your daily calorie intake. ? Limiting your daily sodium intake to 1,500 milligrams (mg). ? Using only healthy fats for cooking, such as olive oil, canola oil, or sunflower oil. ? Counting your daily carbohydrate intake. What lifestyle changes can be made?  Maintain a healthy weight. Talk to your health care provider about your ideal weight.  Get at least 30 minutes of moderate physical activity at least 5 days a week. Moderate activity includes brisk walking, biking, and swimming.  Do not use any products that contain nicotine or tobacco, such as cigarettes and e-cigarettes. If you need help quitting, ask your health care provider. It may also be helpful to avoid exposure to secondhand smoke.  Limit alcohol intake to no more than 1 drink a day for nonpregnant women and 2 drinks a day for men. One drink equals 12 oz of beer, 5 oz of wine, or 1 oz of hard liquor.  Stop any illegal drug use.  Avoid taking birth control pills. Talk to your health care provider about the risks of taking birth control pills if: ? You are over 11 years old. ? You smoke. ? You get migraines. ? You have ever had a blood clot. What other changes can be made?  Manage your cholesterol levels. ? Eating a healthy diet is important for preventing high cholesterol. If cholesterol cannot be managed through diet alone, you may also need to take medicines. ? Take any prescribed medicines to control your cholesterol as told by your  health care provider.  Manage your diabetes. ? Eating a healthy diet and exercising regularly are important parts of managing your blood sugar. If your blood sugar cannot be managed through diet and exercise, you may need to take medicines. ? Take any prescribed medicines to control your diabetes as told by your health care provider.  Control your hypertension. ? To  reduce your risk of stroke, try to keep your blood pressure below 130/80. ? Eating a healthy diet and exercising regularly are an important part of controlling your blood pressure. If your blood pressure cannot be managed through diet and exercise, you may need to take medicines. ? Take any prescribed medicines to control hypertension as told by your health care provider. ? Ask your health care provider if you should monitor your blood pressure at home. ? Have your blood pressure checked every year, even if your blood pressure is normal. Blood pressure increases with age and some medical conditions.  Get evaluated for sleep disorders (sleep apnea). Talk to your health care provider about getting a sleep evaluation if you snore a lot or have excessive sleepiness.  Take over-the-counter and prescription medicines only as told by your health care provider. Aspirin or blood thinners (antiplatelets or anticoagulants) may be recommended to reduce your risk of forming blood clots that can lead to stroke.  Make sure that any other medical conditions you have, such as atrial fibrillation or atherosclerosis, are managed. What are the warning signs of a stroke? The warning signs of a stroke can be easily remembered as BEFAST.  B is for balance. Signs include: ? Dizziness. ? Loss of balance or coordination. ? Sudden trouble walking.  E is for eyes. Signs include: ? A sudden change in vision. ? Trouble seeing.  F is for face. Signs include: ? Sudden weakness or numbness of the face. ? The face or eyelid drooping to one side.  A is for arms. Signs include: ? Sudden weakness or numbness of the arm, usually on one side of the body.  S is for speech. Signs include: ? Trouble speaking (aphasia). ? Trouble understanding.  T is for time. ? These symptoms may represent a serious problem that is an emergency. Do not wait to see if the symptoms will go away. Get medical help right away. Call your local  emergency services (911 in the [place] Do not drive yourself to the hospital.  Other signs of stroke may include: ? A sudden, severe headache with no known cause. ? Nausea or vomiting. ? Seizure. Where to find more information For more information, visit:  American Stroke Association: www.strokeassociation.org  National Stroke Association: www.stroke.org Summary  You can prevent a stroke by eating healthy, exercising, not smoking, limiting alcohol intake, and managing any medical conditions you may have.  Do not use any products that contain nicotine or tobacco, such as cigarettes and e-cigarettes. If you need help quitting, ask your health care provider. It may also be helpful to avoid exposure to secondhand smoke.  Remember BEFAST for warning signs of stroke. Get help right away if you or a loved one has any of these signs. This information is not intended to replace advice given to you by your health care provider. Make sure you discuss any questions you have with your health care provider. Document Revised: 06/08/2017 Document Reviewed: 08/01/2016 Elsevier Patient Education  2020 [employer].

## 2019-12-26 LAB — LIPID PANEL
Chol/HDL Ratio: 2.4 ratio (ref 0.0–4.4)
Cholesterol, Total: 99 mg/dL — ABNORMAL LOW (ref 100–199)
HDL: 42 mg/dL (ref >39)
LDL Chol Calc (NIH): 39 mg/dL (ref 0–99)
Triglycerides: 95 mg/dL (ref 0–149)
VLDL Cholesterol Cal: 18 mg/dL (ref 5–40)

## 2019-12-26 LAB — HEMOGLOBIN A1C
Est. average glucose Bld gHb Est-mCnc: 123 mg/dL
Hgb A1c MFr Bld: 5.9 % — ABNORMAL HIGH (ref 4.8–5.6)

## 2019-12-26 NOTE — Progress Notes (Signed)
Kindly inform patient that lipids and diabetes screening test were satisfactory

## 2019-12-29 ENCOUNTER — Other Ambulatory Visit: Payer: Self-pay | Admitting: Cardiology

## 2019-12-30 ENCOUNTER — Encounter (HOSPITAL_COMMUNITY): Payer: Self-pay

## 2019-12-30 ENCOUNTER — Inpatient Hospital Stay (HOSPITAL_COMMUNITY): Payer: Medicare Other

## 2019-12-30 ENCOUNTER — Telehealth: Payer: Self-pay | Admitting: Neurology

## 2019-12-30 ENCOUNTER — Other Ambulatory Visit: Payer: Self-pay

## 2019-12-30 ENCOUNTER — Telehealth: Payer: Self-pay

## 2019-12-30 VITALS — BP 132/60 | HR 84 | Temp 97.7°F | Resp 18 | Wt 159.9 lb

## 2019-12-30 DIAGNOSIS — Z5112 Encounter for antineoplastic immunotherapy: Secondary | ICD-10-CM | POA: Diagnosis not present

## 2019-12-30 DIAGNOSIS — C9000 Multiple myeloma not having achieved remission: Secondary | ICD-10-CM

## 2019-12-30 LAB — CBC WITH DIFFERENTIAL/PLATELET
Abs Immature Granulocytes: 0.05 10*3/uL (ref 0.00–0.07)
Basophils Absolute: 0 10*3/uL (ref 0.0–0.1)
Basophils Relative: 0 %
Eosinophils Absolute: 0.1 10*3/uL (ref 0.0–0.5)
Eosinophils Relative: 1 %
HCT: 39.9 % (ref 36.0–46.0)
Hemoglobin: 12.4 g/dL (ref 12.0–15.0)
Immature Granulocytes: 1 %
Lymphocytes Relative: 9 %
Lymphs Abs: 1 10*3/uL (ref 0.7–4.0)
MCH: 29.3 pg (ref 26.0–34.0)
MCHC: 31.1 g/dL (ref 30.0–36.0)
MCV: 94.3 fL (ref 80.0–100.0)
Monocytes Absolute: 0.9 10*3/uL (ref 0.1–1.0)
Monocytes Relative: 9 %
Neutro Abs: 8.8 10*3/uL — ABNORMAL HIGH (ref 1.7–7.7)
Neutrophils Relative %: 80 %
Platelets: 175 10*3/uL (ref 150–400)
RBC: 4.23 MIL/uL (ref 3.87–5.11)
RDW: 14.2 % (ref 11.5–15.5)
WBC: 10.8 10*3/uL — ABNORMAL HIGH (ref 4.0–10.5)
nRBC: 0 % (ref 0.0–0.2)

## 2019-12-30 LAB — COMPREHENSIVE METABOLIC PANEL
ALT: 17 U/L (ref 0–44)
AST: 24 U/L (ref 15–41)
Albumin: 3.1 g/dL — ABNORMAL LOW (ref 3.5–5.0)
Alkaline Phosphatase: 107 U/L (ref 38–126)
Anion gap: 15 (ref 5–15)
BUN: 26 mg/dL — ABNORMAL HIGH (ref 8–23)
CO2: 25 mmol/L (ref 22–32)
Calcium: 8.7 mg/dL — ABNORMAL LOW (ref 8.9–10.3)
Chloride: 102 mmol/L (ref 98–111)
Creatinine, Ser: 1.52 mg/dL — ABNORMAL HIGH (ref 0.44–1.00)
GFR calc Af Amer: 37 mL/min — ABNORMAL LOW (ref >60)
GFR calc non Af Amer: 32 mL/min — ABNORMAL LOW (ref >60)
Glucose, Bld: 158 mg/dL — ABNORMAL HIGH (ref 70–99)
Potassium: 3.8 mmol/L (ref 3.5–5.1)
Sodium: 142 mmol/L (ref 135–145)
Total Bilirubin: 0.3 mg/dL (ref 0.3–1.2)
Total Protein: 6.1 g/dL — ABNORMAL LOW (ref 6.5–8.1)

## 2019-12-30 MED ORDER — DARATUMUMAB-HYALURONIDASE-FIHJ 1800-30000 MG-UT/15ML ~~LOC~~ SOLN
1800.0000 mg | Freq: Once | SUBCUTANEOUS | Status: AC
  Administered 2019-12-30: 1800 mg via SUBCUTANEOUS
  Filled 2019-12-30: qty 15

## 2019-12-30 MED ORDER — DEXAMETHASONE 4 MG PO TABS
10.0000 mg | ORAL_TABLET | Freq: Once | ORAL | Status: AC
  Administered 2019-12-30: 10 mg via ORAL
  Filled 2019-12-30: qty 3

## 2019-12-30 MED ORDER — ACETAMINOPHEN 325 MG PO TABS
650.0000 mg | ORAL_TABLET | Freq: Once | ORAL | Status: DC
  Filled 2019-12-30: qty 2

## 2019-12-30 MED ORDER — DIPHENHYDRAMINE HCL 25 MG PO CAPS
50.0000 mg | ORAL_CAPSULE | Freq: Once | ORAL | Status: DC
  Filled 2019-12-30: qty 2

## 2019-12-30 NOTE — Telephone Encounter (Signed)
Patient is scheduled at [facility] for Friday 01/02/20 arrival time is 4:45 pm. I spoke to the patient and she wanted me to call her back and leave her a vmail so I did. She is also aware liquids only 4 hours prior to the exam and I gave her their number of [phone] incase she needed to r/s.  Medicare/mutual of [place] no auth.

## 2019-12-30 NOTE — Progress Notes (Signed)
Patient tolerated Daratumumab injection with no complaints voiced.  Site clean and dry with no bruising or swelling noted at site.  Band aid applied.  Vss with discharge and left ambulatory with no s/s of distress noted.

## 2019-12-30 NOTE — Telephone Encounter (Signed)
IF patient calls back tell her the lab work for diabetes screening and cholesterol was satisfactory.  Left voice mail for pt to call back about lab work results.

## 2019-12-30 NOTE — Telephone Encounter (Signed)
-----   Message from Garvin Fila, MD sent at 12/26/2019  8:30 AM EDT ----- Mitchell Heir inform patient that lipids and diabetes screening test were satisfactory

## 2019-12-30 NOTE — Progress Notes (Signed)
Per Karen Kays to treat with Scr 1.52

## 2019-12-31 NOTE — Telephone Encounter (Signed)
Pt returned call and was informed. Pt verbalized understanding and appreciation.

## 2020-01-02 ENCOUNTER — Other Ambulatory Visit: Payer: Self-pay

## 2020-01-02 ENCOUNTER — Ambulatory Visit (HOSPITAL_COMMUNITY)
Admission: RE | Admit: 2020-01-02 | Discharge: 2020-01-02 | Disposition: A | Discharge status: Home / Self Care | Payer: Medicare Other | Source: Ambulatory Visit | Attending: Neurology | Admitting: Neurology

## 2020-01-02 DIAGNOSIS — I63412 Cerebral infarction due to embolism of left middle cerebral artery: Secondary | ICD-10-CM | POA: Insufficient documentation

## 2020-01-02 MED ORDER — IOHEXOL 350 MG/ML SOLN
100.0000 mL | Freq: Once | INTRAVENOUS | Status: AC | PRN
  Administered 2020-01-02: 60 mL via INTRAVENOUS

## 2020-01-06 ENCOUNTER — Inpatient Hospital Stay (HOSPITAL_COMMUNITY): Payer: Medicare Other

## 2020-01-06 ENCOUNTER — Other Ambulatory Visit: Payer: Self-pay

## 2020-01-06 ENCOUNTER — Encounter (HOSPITAL_COMMUNITY): Payer: Self-pay

## 2020-01-06 VITALS — BP 128/68 | HR 77 | Temp 97.3°F | Resp 18 | Wt 162.0 lb

## 2020-01-06 DIAGNOSIS — D801 Nonfamilial hypogammaglobulinemia: Secondary | ICD-10-CM

## 2020-01-06 DIAGNOSIS — C9000 Multiple myeloma not having achieved remission: Secondary | ICD-10-CM

## 2020-01-06 DIAGNOSIS — D638 Anemia in other chronic diseases classified elsewhere: Secondary | ICD-10-CM

## 2020-01-06 DIAGNOSIS — D509 Iron deficiency anemia, unspecified: Secondary | ICD-10-CM

## 2020-01-06 DIAGNOSIS — E538 Deficiency of other specified B group vitamins: Secondary | ICD-10-CM

## 2020-01-06 DIAGNOSIS — Z5112 Encounter for antineoplastic immunotherapy: Secondary | ICD-10-CM | POA: Diagnosis not present

## 2020-01-06 DIAGNOSIS — N183 Chronic kidney disease, stage 3 unspecified: Secondary | ICD-10-CM

## 2020-01-06 LAB — CBC WITH DIFFERENTIAL/PLATELET
Abs Immature Granulocytes: 0.02 10*3/uL (ref 0.00–0.07)
Basophils Absolute: 0 10*3/uL (ref 0.0–0.1)
Basophils Relative: 0 %
Eosinophils Absolute: 0 10*3/uL (ref 0.0–0.5)
Eosinophils Relative: 1 %
HCT: 35.1 % — ABNORMAL LOW (ref 36.0–46.0)
Hemoglobin: 10.8 g/dL — ABNORMAL LOW (ref 12.0–15.0)
Immature Granulocytes: 0 %
Lymphocytes Relative: 10 %
Lymphs Abs: 0.8 10*3/uL (ref 0.7–4.0)
MCH: 29.5 pg (ref 26.0–34.0)
MCHC: 30.8 g/dL (ref 30.0–36.0)
MCV: 95.9 fL (ref 80.0–100.0)
Monocytes Absolute: 0.9 10*3/uL (ref 0.1–1.0)
Monocytes Relative: 11 %
Neutro Abs: 6.3 10*3/uL (ref 1.7–7.7)
Neutrophils Relative %: 78 %
Platelets: 182 10*3/uL (ref 150–400)
RBC: 3.66 MIL/uL — ABNORMAL LOW (ref 3.87–5.11)
RDW: 14.8 % (ref 11.5–15.5)
WBC: 8.1 10*3/uL (ref 4.0–10.5)
nRBC: 0 % (ref 0.0–0.2)

## 2020-01-06 LAB — COMPREHENSIVE METABOLIC PANEL
ALT: 18 U/L (ref 0–44)
AST: 19 U/L (ref 15–41)
Albumin: 2.9 g/dL — ABNORMAL LOW (ref 3.5–5.0)
Alkaline Phosphatase: 99 U/L (ref 38–126)
Anion gap: 9 (ref 5–15)
BUN: 31 mg/dL — ABNORMAL HIGH (ref 8–23)
CO2: 25 mmol/L (ref 22–32)
Calcium: 8.1 mg/dL — ABNORMAL LOW (ref 8.9–10.3)
Chloride: 110 mmol/L (ref 98–111)
Creatinine, Ser: 1.58 mg/dL — ABNORMAL HIGH (ref 0.44–1.00)
GFR calc Af Amer: 36 mL/min — ABNORMAL LOW (ref >60)
GFR calc non Af Amer: 31 mL/min — ABNORMAL LOW (ref >60)
Glucose, Bld: 106 mg/dL — ABNORMAL HIGH (ref 70–99)
Potassium: 4.2 mmol/L (ref 3.5–5.1)
Sodium: 144 mmol/L (ref 135–145)
Total Bilirubin: 0.3 mg/dL (ref 0.3–1.2)
Total Protein: 5.7 g/dL — ABNORMAL LOW (ref 6.5–8.1)

## 2020-01-06 LAB — IRON AND TIBC
Iron: 49 ug/dL (ref 28–170)
Saturation Ratios: 18 % (ref 10.4–31.8)
TIBC: 275 ug/dL (ref 250–450)
UIBC: 226 ug/dL

## 2020-01-06 LAB — LACTATE DEHYDROGENASE: LDH: 157 U/L (ref 98–192)

## 2020-01-06 LAB — MAGNESIUM: Magnesium: 2.2 mg/dL (ref 1.7–2.4)

## 2020-01-06 LAB — FERRITIN: Ferritin: 23 ng/mL (ref 11–307)

## 2020-01-06 MED ORDER — DEXAMETHASONE 4 MG PO TABS
10.0000 mg | ORAL_TABLET | Freq: Once | ORAL | Status: AC
  Administered 2020-01-06: 10 mg via ORAL
  Filled 2020-01-06: qty 3

## 2020-01-06 MED ORDER — ACETAMINOPHEN 325 MG PO TABS: 650.0000 mg | ORAL_TABLET | Freq: Once | ORAL | Status: DC

## 2020-01-06 MED ORDER — CYANOCOBALAMIN 1000 MCG/ML IJ SOLN
INTRAMUSCULAR | Status: AC
  Filled 2020-01-06: qty 1

## 2020-01-06 MED ORDER — DIPHENHYDRAMINE HCL 25 MG PO CAPS: 50.0000 mg | ORAL_CAPSULE | Freq: Once | ORAL | Status: DC

## 2020-01-06 MED ORDER — DARBEPOETIN ALFA 500 MCG/ML IJ SOSY
PREFILLED_SYRINGE | INTRAMUSCULAR | Status: AC
  Filled 2020-01-06: qty 1

## 2020-01-06 MED ORDER — CYANOCOBALAMIN 1000 MCG/ML IJ SOLN
1000.0000 ug | Freq: Once | INTRAMUSCULAR | Status: AC
  Administered 2020-01-06: 1000 ug via INTRAMUSCULAR

## 2020-01-06 MED ORDER — DARBEPOETIN ALFA 300 MCG/0.6ML IJ SOSY: 300.0000 ug | PREFILLED_SYRINGE | Freq: Once | INTRAMUSCULAR | Status: DC

## 2020-01-06 MED ORDER — DARATUMUMAB-HYALURONIDASE-FIHJ 1800-30000 MG-UT/15ML ~~LOC~~ SOLN
1800.0000 mg | Freq: Once | SUBCUTANEOUS | Status: AC
  Administered 2020-01-06: 1800 mg via SUBCUTANEOUS
  Filled 2020-01-06: qty 15

## 2020-01-06 NOTE — Progress Notes (Signed)
Aranesp held today.  Hemoglobin 10.8.   B-12 injection given in left deltoid per order.   Tolerated treatment well today.  Discharged ambulatory.

## 2020-01-06 NOTE — Progress Notes (Signed)
Labs are within parameters today . Will proceed with treatment today.

## 2020-01-06 NOTE — Patient Instructions (Signed)
[facility] Discharge Instructions for Patients Receiving Chemotherapy  Today you received the following chemotherapy agents   To help prevent nausea and vomiting after your treatment, we encourage you to take your nausea medication   If you develop nausea and vomiting that is not controlled by your nausea medication, call the clinic.   BELOW ARE SYMPTOMS THAT SHOULD BE REPORTED IMMEDIATELY:  *FEVER GREATER THAN 100.5 F  *CHILLS WITH OR WITHOUT FEVER  NAUSEA AND VOMITING THAT IS NOT CONTROLLED WITH YOUR NAUSEA MEDICATION  *UNUSUAL SHORTNESS OF BREATH  *UNUSUAL BRUISING OR BLEEDING  TENDERNESS IN MOUTH AND THROAT WITH OR WITHOUT PRESENCE OF ULCERS  *URINARY PROBLEMS  *BOWEL PROBLEMS  UNUSUAL RASH Items with * indicate a potential emergency and should be followed up as soon as possible.  Feel free to call the clinic should you have any questions or concerns. The clinic phone number is [phone].  Please show the CHEMO ALERT CARD at check-in to the Emergency Department and triage nurse.   

## 2020-01-07 LAB — PROTEIN ELECTROPHORESIS, SERUM
A/G Ratio: 1.3 (ref 0.7–1.7)
Albumin ELP: 2.9 g/dL (ref 2.9–4.4)
Alpha-1-Globulin: 0.2 g/dL (ref 0.0–0.4)
Alpha-2-Globulin: 0.8 g/dL (ref 0.4–1.0)
Beta Globulin: 0.7 g/dL (ref 0.7–1.3)
Gamma Globulin: 0.6 g/dL (ref 0.4–1.8)
Globulin, Total: 2.3 g/dL (ref 2.2–3.9)
M-Spike, %: 0.3 g/dL — ABNORMAL HIGH
Total Protein ELP: 5.2 g/dL — ABNORMAL LOW (ref 6.0–8.5)

## 2020-01-07 LAB — KAPPA/LAMBDA LIGHT CHAINS
Kappa free light chain: 94.2 mg/L — ABNORMAL HIGH (ref 3.3–19.4)
Kappa, lambda light chain ratio: 21.91 — ABNORMAL HIGH (ref 0.26–1.65)
Lambda free light chains: 4.3 mg/L — ABNORMAL LOW (ref 5.7–26.3)

## 2020-01-13 ENCOUNTER — Inpatient Hospital Stay (HOSPITAL_COMMUNITY): Payer: Medicare Other

## 2020-01-13 ENCOUNTER — Telehealth: Payer: Self-pay

## 2020-01-13 ENCOUNTER — Other Ambulatory Visit: Payer: Self-pay

## 2020-01-13 ENCOUNTER — Inpatient Hospital Stay (HOSPITAL_COMMUNITY): Payer: Medicare Other | Attending: Hematology | Admitting: Hematology

## 2020-01-13 VITALS — BP 122/43 | HR 72 | Temp 97.3°F | Resp 18

## 2020-01-13 DIAGNOSIS — Z79899 Other long term (current) drug therapy: Secondary | ICD-10-CM | POA: Diagnosis not present

## 2020-01-13 DIAGNOSIS — I2510 Atherosclerotic heart disease of native coronary artery without angina pectoris: Secondary | ICD-10-CM | POA: Insufficient documentation

## 2020-01-13 DIAGNOSIS — D631 Anemia in chronic kidney disease: Secondary | ICD-10-CM | POA: Diagnosis not present

## 2020-01-13 DIAGNOSIS — J449 Chronic obstructive pulmonary disease, unspecified: Secondary | ICD-10-CM | POA: Insufficient documentation

## 2020-01-13 DIAGNOSIS — E785 Hyperlipidemia, unspecified: Secondary | ICD-10-CM | POA: Insufficient documentation

## 2020-01-13 DIAGNOSIS — D509 Iron deficiency anemia, unspecified: Secondary | ICD-10-CM

## 2020-01-13 DIAGNOSIS — I129 Hypertensive chronic kidney disease with stage 1 through stage 4 chronic kidney disease, or unspecified chronic kidney disease: Secondary | ICD-10-CM | POA: Insufficient documentation

## 2020-01-13 DIAGNOSIS — Z5112 Encounter for antineoplastic immunotherapy: Secondary | ICD-10-CM | POA: Diagnosis present

## 2020-01-13 DIAGNOSIS — Z87891 Personal history of nicotine dependence: Secondary | ICD-10-CM | POA: Diagnosis not present

## 2020-01-13 DIAGNOSIS — N183 Chronic kidney disease, stage 3 unspecified: Secondary | ICD-10-CM | POA: Insufficient documentation

## 2020-01-13 DIAGNOSIS — D638 Anemia in other chronic diseases classified elsewhere: Secondary | ICD-10-CM

## 2020-01-13 DIAGNOSIS — E538 Deficiency of other specified B group vitamins: Secondary | ICD-10-CM

## 2020-01-13 DIAGNOSIS — C9000 Multiple myeloma not having achieved remission: Secondary | ICD-10-CM

## 2020-01-13 DIAGNOSIS — R634 Abnormal weight loss: Secondary | ICD-10-CM | POA: Diagnosis not present

## 2020-01-13 DIAGNOSIS — Z9221 Personal history of antineoplastic chemotherapy: Secondary | ICD-10-CM | POA: Insufficient documentation

## 2020-01-13 DIAGNOSIS — Z7982 Long term (current) use of aspirin: Secondary | ICD-10-CM | POA: Diagnosis not present

## 2020-01-13 LAB — COMPREHENSIVE METABOLIC PANEL
ALT: 20 U/L (ref 0–44)
AST: 24 U/L (ref 15–41)
Albumin: 3.1 g/dL — ABNORMAL LOW (ref 3.5–5.0)
Alkaline Phosphatase: 88 U/L (ref 38–126)
Anion gap: 9 (ref 5–15)
BUN: 28 mg/dL — ABNORMAL HIGH (ref 8–23)
CO2: 25 mmol/L (ref 22–32)
Calcium: 8.2 mg/dL — ABNORMAL LOW (ref 8.9–10.3)
Chloride: 108 mmol/L (ref 98–111)
Creatinine, Ser: 1.51 mg/dL — ABNORMAL HIGH (ref 0.44–1.00)
GFR calc Af Amer: 38 mL/min — ABNORMAL LOW (ref >60)
GFR calc non Af Amer: 33 mL/min — ABNORMAL LOW (ref >60)
Glucose, Bld: 156 mg/dL — ABNORMAL HIGH (ref 70–99)
Potassium: 4.2 mmol/L (ref 3.5–5.1)
Sodium: 142 mmol/L (ref 135–145)
Total Bilirubin: 0.5 mg/dL (ref 0.3–1.2)
Total Protein: 5.7 g/dL — ABNORMAL LOW (ref 6.5–8.1)

## 2020-01-13 LAB — CBC WITH DIFFERENTIAL/PLATELET
Abs Immature Granulocytes: 0.01 10*3/uL (ref 0.00–0.07)
Basophils Absolute: 0 10*3/uL (ref 0.0–0.1)
Basophils Relative: 0 %
Eosinophils Absolute: 0 10*3/uL (ref 0.0–0.5)
Eosinophils Relative: 0 %
HCT: 34.9 % — ABNORMAL LOW (ref 36.0–46.0)
Hemoglobin: 10.8 g/dL — ABNORMAL LOW (ref 12.0–15.0)
Immature Granulocytes: 0 %
Lymphocytes Relative: 13 %
Lymphs Abs: 1 10*3/uL (ref 0.7–4.0)
MCH: 29.8 pg (ref 26.0–34.0)
MCHC: 30.9 g/dL (ref 30.0–36.0)
MCV: 96.1 fL (ref 80.0–100.0)
Monocytes Absolute: 0.7 10*3/uL (ref 0.1–1.0)
Monocytes Relative: 9 %
Neutro Abs: 5.7 10*3/uL (ref 1.7–7.7)
Neutrophils Relative %: 78 %
Platelets: 186 10*3/uL (ref 150–400)
RBC: 3.63 MIL/uL — ABNORMAL LOW (ref 3.87–5.11)
RDW: 15.5 % (ref 11.5–15.5)
WBC: 7.3 10*3/uL (ref 4.0–10.5)
nRBC: 0 % (ref 0.0–0.2)

## 2020-01-13 MED ORDER — ACETAMINOPHEN 325 MG PO TABS: 650.0000 mg | ORAL_TABLET | Freq: Once | ORAL | Status: DC

## 2020-01-13 MED ORDER — DARATUMUMAB-HYALURONIDASE-FIHJ 1800-30000 MG-UT/15ML ~~LOC~~ SOLN
1800.0000 mg | Freq: Once | SUBCUTANEOUS | Status: AC
  Administered 2020-01-13: 1800 mg via SUBCUTANEOUS
  Filled 2020-01-13: qty 15

## 2020-01-13 MED ORDER — DIPHENHYDRAMINE HCL 25 MG PO CAPS: 50.0000 mg | ORAL_CAPSULE | Freq: Once | ORAL | Status: DC

## 2020-01-13 MED ORDER — SODIUM CHLORIDE 0.9 % IV SOLN
510.0000 mg | Freq: Once | INTRAVENOUS | Status: AC
  Administered 2020-01-13: 510 mg via INTRAVENOUS
  Filled 2020-01-13: qty 17

## 2020-01-13 MED ORDER — SODIUM CHLORIDE 0.9 % IV SOLN: Freq: Once | INTRAVENOUS | Status: AC

## 2020-01-13 MED ORDER — DEXAMETHASONE 2 MG PO TABS: ORAL_TABLET | ORAL | 3 refills | Status: DC

## 2020-01-13 MED ORDER — DEXAMETHASONE 4 MG PO TABS
10.0000 mg | ORAL_TABLET | Freq: Once | ORAL | Status: AC
  Administered 2020-01-13: 10 mg via ORAL
  Filled 2020-01-13: qty 3

## 2020-01-13 NOTE — Telephone Encounter (Signed)
Pt calling for imaging results. 

## 2020-01-13 NOTE — Progress Notes (Signed)
Patient has been assessed, vital signs and labs have been reviewed by Dr. Delton Coombes. ANC, Creatinine, LFTs, and Platelets are within treatment parameters per Dr. Delton Coombes. The patient is good to proceed with treatment at this time. Patient will need Feraheme infusion today per Dr. Delton Coombes. She will get another Feraheme infusion next week as well.

## 2020-01-13 NOTE — Patient Instructions (Signed)
[facility] Discharge Instructions for Patients Receiving Chemotherapy  Today you received the following chemotherapy agents   To help prevent nausea and vomiting after your treatment, we encourage you to take your nausea medication   If you develop nausea and vomiting that is not controlled by your nausea medication, call the clinic.   BELOW ARE SYMPTOMS THAT SHOULD BE REPORTED IMMEDIATELY:  *FEVER GREATER THAN 100.5 F  *CHILLS WITH OR WITHOUT FEVER  NAUSEA AND VOMITING THAT IS NOT CONTROLLED WITH YOUR NAUSEA MEDICATION  *UNUSUAL SHORTNESS OF BREATH  *UNUSUAL BRUISING OR BLEEDING  TENDERNESS IN MOUTH AND THROAT WITH OR WITHOUT PRESENCE OF ULCERS  *URINARY PROBLEMS  *BOWEL PROBLEMS  UNUSUAL RASH Items with * indicate a potential emergency and should be followed up as soon as possible.  Feel free to call the clinic should you have any questions or concerns. The clinic phone number is [phone].  Please show the CHEMO ALERT CARD at check-in to the Emergency Department and triage nurse.   

## 2020-01-13 NOTE — Progress Notes (Signed)
[name], [name]   CLINIC:  Medical Oncology/Hematology  PCP:  [name], MD [address] / [place] [address] [phone]   REASON FOR VISIT:  Follow-up for multiple myeloma and IDA  PRIOR THERAPY: Daratumumab, pomalidomide from 07/28/2019 through 08/25/2019  CURRENT THERAPY: Darzalex Faspro; Feraheme  BRIEF ONCOLOGIC HISTORY:  Oncology History  Multiple myeloma ([place])  02/26/2015 Initial Biopsy   BMBX 50% cellularity, igG kappa myeloma, IgG at 3600 mg/dl, FISH with monosomy of chromosome 13, gain 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    03/18/2015 - 07/28/2015 Chemotherapy   Velcade 1.6 mg/m2 discontinued secondary to intolerance   07/28/2015 Adverse Reaction   stool incontinence, weakness, collapse upon standing, felt to be secondary to velcade   11/09/2015 - 12/13/2015 Chemotherapy   cytoxan IV 300 mg/m2 administered X 2 doses only in addition to rev/dex   11/09/2015 - 06/08/2016 Chemotherapy   Revlimid/Dexamethasone    02/05/2017 - 07/09/2017 Chemotherapy   The patient had bortezomib SQ (VELCADE) chemo injection 1.75 mg, 1 mg/m2 = 1.75 mg (66.7 % of original dose 1.5 mg/m2), Subcutaneous,  Once, 1 of 8 cycles Dose modification: 1.5 mg/m2 (original dose 1.5 mg/m2, Cycle 1, Reason: Provider Judgment, Comment: Per Dr. Norma Fredrickson recommendations from [facility] 1 mg/m2 (original dose 1.5 mg/m2, Cycle 1, Reason: Provider Judgment)  for chemotherapy treatment.     07/09/2017 Adverse Reaction   Diarrhea    Chemotherapy   Pomalyst 2 mg (Days 1-21 every 28 days), Ixazomib 2.3 mg (days 1, 8, 15 every 28 days), and Dexamethasone 10 mg (weekly)- Rx's printed on 08/24/2017.  Treatment recommendations from Dr. Norma Fredrickson at [facility].   07/28/2019 -  Chemotherapy   The patient had dexamethasone (DECADRON) tablet 20 mg, 20 mg, Oral, Once, 3 of 10 cycles Dose modification: 10 mg (original dose 10 mg, Cycle  1) Administration: 20 mg (07/28/2019), 10 mg (08/04/2019), 10 mg (08/11/2019), 10 mg (08/25/2019), 10 mg (12/09/2019), 10 mg (12/16/2019), 10 mg (12/23/2019), 10 mg (12/30/2019), 10 mg (01/06/2020) daratumumab-hyaluronidase-fihj (DARZALEX FASPRO) 1800-30000 MG-UT/15ML chemo SQ injection 1,800 mg, 1,800 mg, Subcutaneous,  Once, 3 of 10 cycles Administration: 1,800 mg (07/28/2019), 1,800 mg (08/04/2019), 1,800 mg (08/11/2019), 1,800 mg (08/25/2019), 1,800 mg (12/09/2019), 1,800 mg (12/16/2019), 1,800 mg (12/23/2019), 1,800 mg (12/30/2019), 1,800 mg (01/06/2020)  for chemotherapy treatment.      CANCER STAGING: Cancer Staging No matching staging information was found for the patient.  INTERVAL HISTORY:  [name], a 79 y.o. female, returns for routine follow-up and consideration for next cycle of chemotherapy. [name] was last seen on 12/16/2019.  Due for cycle #3 of Darzalex Faspro today.   Overall, she tells me she has been feeling pretty well. She has an appointment with Dr. Diona Foley for her eyesight sometime in 01/2020. She has seen the neurologist, but has not heard back from them yet. She had an iron infusion before and tolerated it well. She reports that she has trouble sleeping after taking a steroid.   Overall, she feels ready for next cycle of chemo today.    REVIEW OF SYSTEMS:  Review of Systems  Constitutional: Positive for fatigue (moderate). Negative for appetite change.  Eyes: Positive for eye problems (blurry).  Respiratory: Positive for shortness of breath.   Neurological: Positive for numbness (feet).  Psychiatric/Behavioral: Positive for sleep disturbance.  All other systems reviewed and are negative.   PAST MEDICAL/SURGICAL HISTORY:  Past Medical History:  Diagnosis Date  .  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 CAD (coronary artery disease)   . COPD (chronic obstructive pulmonary disease) ([place])   . History of tobacco abuse   . Hyperlipidemia   .  Hypertension   . 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 Multiple myeloma ([place])   . Vitamin B12 deficiency 04/15/2016   Overview:  Vitamin B12 level documented 155, January 2016 with the normal range being 211-924   Past Surgical History:  Procedure Laterality Date  . BACK SURGERY    . CARDIAC CATHETERIZATION  04/2011   right and left cath showing normal right heart pressures,but newly diagnosed coronary artery disease/drug eluting stent placed to RCA with residual disease in the proximal RCA and LAD and ramus, normal LV function and 60-65% EF  . COLONOSCOPY N/A 09/30/2014   Procedure: COLONOSCOPY;  Surgeon: Rogene Houston, MD;  [facility];  Service: Endoscopy;  Laterality: N/A;  225  . CORONARY ANGIOPLASTY WITH STENT PLACEMENT  04/2011   mid RCA: 3.0 X38 mm Promus DES. Residual 40% disease proximally  . ESOPHAGOGASTRODUODENOSCOPY N/A 09/10/2019   Procedure: ESOPHAGOGASTRODUODENOSCOPY (EGD);  Surgeon: Clarene Essex, MD;  Location: [facility];  Service: Endoscopy;  Laterality: N/A;  . NECK SURGERY      SOCIAL HISTORY:  Social History   Socioeconomic History  . Marital status: Widowed    Spouse name: Not on file  . Number of children: Not on file  . Years of education: Not on file  . Highest education level: Not on file  Occupational History  . 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 Smoking status: Former Smoker    Packs/day: 3.00    Years: 25.00    Pack years: 75.00    Types: Cigarettes    Quit date: 07/10/1994    Years since quitting: 25.5  . Smokeless tobacco: Never Used  Vaping Use  . Vaping Use: Never used  Substance and Sexual Activity  . Alcohol use: No  . Drug use: Never  . Sexual activity: Not on file  Other Topics Concern  . Not on file  Social History Narrative  . Not on file   Social Determinants of Health   Financial Resource Strain:   . Difficulty of Paying Living Expenses:   Food Insecurity: No Food Insecurity   . Worried About Charity fundraiser in the Last Year: Never true  . Ran Out of Food in the Last Year: Never true  Transportation Needs: No Transportation Needs  . Lack of Transportation (Medical): No  . Lack of Transportation (Non-Medical): No  Physical Activity:   . Days of Exercise per Week:   . Minutes of Exercise per Session:   Stress:   . Feeling of Stress :   Social Connections:   . Frequency of Communication with Friends and Family:   . Frequency of Social Gatherings with Friends and Family:   . Attends Religious Services:   . Active Member of Clubs or Organizations:   . Attends Archivist Meetings:   Marland Kitchen Marital Status:   Intimate Partner Violence:   . Fear of Current or Ex-Partner:   . Emotionally Abused:   Marland Kitchen Physically Abused:   . Sexually Abused:     FAMILY HISTORY:  Family History  Problem Relation Age of Onset  . Heart failure Mother   . Cancer Father     CURRENT MEDICATIONS:  Current Outpatient Medications  Medication Sig Dispense Refill  .  aspirin EC 81 MG tablet Take 1 tablet (81 mg total) by mouth daily. (Patient taking differently: Take 325 mg by mouth daily. ) 90 tablet 3  . atorvastatin (LIPITOR) 10 MG tablet TAKE ONE TABLET BY MOUTH DAILY 90 tablet 1  . Calcium Carbonate-Vit D-Min (CALCIUM 600+D PLUS MINERALS) 600-400 MG-UNIT CHEW Chew 1 tablet by mouth 3 (three) times daily.     . cyanocobalamin (,VITAMIN B-12,) 1000 MCG/ML injection Inject 1,000 mcg into the skin every 30 (thirty) days.     . daratumumab-hyaluronidase-fihj (DARZALEX FASPRO) 1800-30000 MG-UT/15ML SOLN Inject 1,800 mg into the skin once.    Marland Kitchen dexamethasone (DECADRON) 2 MG tablet Take five tablets ('10mg'$ ) by mouth weekly. 20 tablet 2  . diltiazem (CARDIZEM CD) 240 MG 24 hr capsule TAKE ONE CAPSULE BY MOUTH EVERY DAY 90 capsule 3  . furosemide (LASIX) 20 MG tablet Take 40 mg by mouth daily.     Marland Kitchen ipratropium (ATROVENT) 0.06 % nasal spray SMARTSIG:2 Spray(s) Both Nares Twice Daily  PRN    . magnesium oxide (MAG-OX) 400 (241.3 Mg) MG tablet Take 1 tablet by mouth 2 (two) times daily.    . metoprolol tartrate (LOPRESSOR) 25 MG tablet TAKE 1 AND 1/2 TABLETS BY MOUTH TWICE DAILY. 75 tablet 3  . pantoprazole (PROTONIX) 40 MG tablet Take 1 tablet (40 mg total) by mouth 2 (two) times daily. 60 tablet 0  . polyethylene glycol powder (GLYCOLAX/MIRALAX) 17 GM/SCOOP powder Take by mouth as needed.     . potassium chloride (KLOR-CON) 10 MEQ tablet TAKE ONE TABLET BY MOUTH THREE TIMES DAILY. 90 tablet 4  . tetracycline (SUMYCIN) 500 MG capsule Take 500 mg by mouth 2 (two) times daily.     Marland Kitchen albuterol (VENTOLIN HFA) 108 (90 Base) MCG/ACT inhaler Inhale 2 puffs into the lungs every 6 (six) hours as needed for wheezing or shortness of breath. (Patient not taking: Reported on 01/13/2020) 18 g 2  . diazepam (VALIUM) 5 MG tablet TAKE ONE TABLET BY MOUTH EVERY 6 HOURS AS NEEDED FOR ANXIETY (Patient not taking: Reported on 01/13/2020) 30 tablet 1  . famotidine (PEPCID) 20 MG tablet Take 20 mg by mouth 2 (two) times daily as needed. (Patient not taking: Reported on 01/13/2020)    . fluticasone (FLONASE) 50 MCG/ACT nasal spray Place 2 sprays into both nostrils as needed.  (Patient not taking: Reported on 01/13/2020)    . ondansetron (ZOFRAN) 8 MG tablet Take 1 tablet (8 mg total) by mouth every 8 (eight) hours as needed for nausea or vomiting. (Patient not taking: Reported on 01/13/2020) 20 tablet 0  . umeclidinium-vilanterol (ANORO ELLIPTA) 62.5-25 MCG/INH AEPB Inhale 1 puff into the lungs daily. (Patient not taking: Reported on 01/13/2020) 60 each 1   No current facility-administered medications for this visit.    ALLERGIES:  Allergies  Allergen Reactions  . Lisinopril Cough  . Plavix [Clopidogrel Bisulfate] Swelling    PHYSICAL EXAM:  Performance status (ECOG): 1 - Symptomatic but completely ambulatory  Vitals:   01/13/20 1304  BP: (!) 147/68  Pulse: 79  Resp: 18  Temp: (!) 97.3 F (36.3 C)   SpO2: 91%   Wt Readings from Last 3 Encounters:  01/13/20 74.9 kg (165 lb 1.6 oz)  01/06/20 73.5 kg (162 lb)  12/30/19 72.5 kg (159 lb 14.4 oz)   Physical Exam Vitals reviewed.  Constitutional:      Appearance: Normal appearance.  Cardiovascular:     Rate and Rhythm: Normal rate and regular rhythm.  Pulses: Normal pulses.     Heart sounds: Normal heart sounds.  Pulmonary:     Effort: Pulmonary effort is normal.     Breath sounds: Normal breath sounds.  Abdominal:     Palpations: Abdomen is soft. There is no mass.     Tenderness: There is no abdominal tenderness.  Musculoskeletal:     Right lower leg: No edema.     Left lower leg: No edema.  Neurological:     General: No focal deficit present.     Mental Status: She is alert and oriented to person, place, and time.  Psychiatric:        Mood and Affect: Mood normal.        Behavior: Behavior normal.     LABORATORY DATA:  I have reviewed the labs as listed.  CBC Latest Ref Rng & Units 01/13/2020 01/06/2020 12/30/2019  WBC 4.0 - 10.5 K/uL 7.3 8.1 10.8(H)  Hemoglobin 12.0 - 15.0 g/dL 10.8(L) 10.8(L) 12.4  Hematocrit 36 - 46 % 34.9(L) 35.1(L) 39.9  Platelets 150 - 400 K/uL 186 182 175   CMP Latest Ref Rng & Units 01/13/2020 01/06/2020 12/30/2019  Glucose 70 - 99 mg/dL 156(H) 106(H) 158(H)  BUN 8 - 23 mg/dL 28(H) 31(H) 26(H)  Creatinine 0.44 - 1.00 mg/dL 1.51(H) 1.58(H) 1.52(H)  Sodium 135 - 145 mmol/L 142 144 142  Potassium 3.5 - 5.1 mmol/L 4.2 4.2 3.8  Chloride 98 - 111 mmol/L 108 110 102  CO2 22 - 32 mmol/L '25 25 25  '$ Calcium 8.9 - 10.3 mg/dL 8.2(L) 8.1(L) 8.7(L)  Total Protein 6.5 - 8.1 g/dL 5.7(L) 5.7(L) 6.1(L)  Total Bilirubin 0.3 - 1.2 mg/dL 0.5 0.3 0.3  Alkaline Phos 38 - 126 U/L 88 99 107  AST 15 - 41 U/L '24 19 24  '$ ALT 0 - 44 U/L '20 18 17   '$ Lab Results  Component Value Date   TIBC 275 01/06/2020   TIBC 309 08/04/2019   TIBC 308 03/04/2019   FERRITIN 23 01/06/2020   FERRITIN 62 08/04/2019   FERRITIN 26  03/04/2019   IRONPCTSAT 18 01/06/2020   IRONPCTSAT 18 08/04/2019   IRONPCTSAT 7 (L) 03/04/2019   Lab Results  Component Value Date   TOTALPROTELP 5.2 (L) 01/06/2020   ALBUMINELP 2.9 01/06/2020   A1GS 0.2 01/06/2020   A2GS 0.8 01/06/2020   BETS 0.7 01/06/2020   GAMS 0.6 01/06/2020   MSPIKE 0.3 (H) 01/06/2020   SPEI Comment 01/06/2020    Lab Results  Component Value Date   KPAFRELGTCHN 94.2 (H) 01/06/2020   LAMBDASER 4.3 (L) 01/06/2020   KAPLAMBRATIO 21.91 (H) 01/06/2020    DIAGNOSTIC IMAGING:  I have independently reviewed the scans and discussed with the patient. CT ANGIO HEAD W OR WO CONTRAST  Result Date: 01/04/2020 CLINICAL DATA:  Infarct follow-up EXAM: CT ANGIOGRAPHY HEAD AND NECK TECHNIQUE: Multidetector CT imaging of the head and neck was performed using the standard protocol during bolus administration of intravenous contrast. Multiplanar CT image reconstructions and MIPs were obtained to evaluate the vascular anatomy. Carotid stenosis measurements (when applicable) are obtained utilizing NASCET criteria, using the distal internal carotid diameter as the denominator. CONTRAST:  26m OMNIPAQUE IOHEXOL 350 MG/ML SOLN COMPARISON:  Brain MRI 12/11/2019 FINDINGS: CT HEAD FINDINGS Brain: There is no mass, hemorrhage or extra-axial collection. The size and configuration of the ventricles and extra-axial CSF spaces are normal. Subacute to chronic infarct at the right temporal occipital junction. The brain parenchyma is normal. Skull: The visualized skull  base, calvarium and extracranial soft tissues are normal. Sinuses/Orbits: No fluid levels or advanced mucosal thickening of the visualized paranasal sinuses. No mastoid or middle ear effusion. The orbits are normal. CTA NECK FINDINGS SKELETON: C4-7 anterior fusion OTHER NECK: Normal pharynx, larynx and major salivary glands. No cervical lymphadenopathy. Unremarkable thyroid gland. UPPER CHEST: Emphysema AORTIC ARCH: There is mild calcific  atherosclerosis of the aortic arch. There is no aneurysm, dissection or hemodynamically significant stenosis of the visualized portion of the aorta. Conventional 3 vessel aortic branching pattern. The visualized proximal subclavian arteries are widely patent. RIGHT CAROTID SYSTEM: No dissection, occlusion or aneurysm. There is calcific atherosclerosis extending into the proximal ICA, resulting in less than 50% stenosis. LEFT CAROTID SYSTEM: No dissection, occlusion or aneurysm. Mild atherosclerotic calcification at the carotid bifurcation without hemodynamically significant stenosis. VERTEBRAL ARTERIES: Codominant configuration. Both origins are clearly patent. There is no dissection, occlusion or flow-limiting stenosis to the skull base (V1-V3 segments). CTA HEAD FINDINGS POSTERIOR CIRCULATION: --Vertebral arteries: Normal V4 segments. --Inferior cerebellar arteries: Normal. --Basilar artery: Normal. --Superior cerebellar arteries: Normal. --Posterior cerebral arteries (PCA): Normal. ANTERIOR CIRCULATION: --Intracranial internal carotid arteries: Normal. --Anterior cerebral arteries (ACA): Normal. Both A1 segments are present. Patent anterior communicating artery (a-comm). --Middle cerebral arteries (MCA): Normal. VENOUS SINUSES: As permitted by contrast timing, patent. ANATOMIC VARIANTS: None Review of the MIP images confirms the above findings. IMPRESSION: 1. No emergent large vessel occlusion or high-grade stenosis of the head or neck. 2. 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 3. Aortic Atherosclerosis (ICD10-I70.0) and Emphysema (ICD10-J43.9). Electronically Signed   By: Ulyses Jarred M.D.   On: 01/04/2020 01:34   CT ANGIO NECK W OR WO CONTRAST  Result Date: 01/04/2020 CLINICAL DATA:  Infarct follow-up EXAM: CT ANGIOGRAPHY HEAD AND NECK TECHNIQUE: Multidetector CT imaging of the head and neck was performed using the standard protocol during bolus administration of intravenous  contrast. Multiplanar CT image reconstructions and MIPs were obtained to evaluate the vascular anatomy. Carotid stenosis measurements (when applicable) are obtained utilizing NASCET criteria, using the distal internal carotid diameter as the denominator. CONTRAST:  46m OMNIPAQUE IOHEXOL 350 MG/ML SOLN COMPARISON:  Brain MRI 12/11/2019 FINDINGS: CT HEAD FINDINGS Brain: There is no mass, hemorrhage or extra-axial collection. The size and configuration of the ventricles and extra-axial CSF spaces are normal. Subacute to chronic infarct at the right temporal occipital junction. The brain parenchyma is normal. Skull: The visualized skull base, calvarium and extracranial soft tissues are normal. Sinuses/Orbits: No fluid levels or advanced mucosal thickening of the visualized paranasal sinuses. No mastoid or middle ear effusion. The orbits are normal. CTA NECK FINDINGS SKELETON: C4-7 anterior fusion OTHER NECK: Normal pharynx, larynx and major salivary glands. No cervical lymphadenopathy. Unremarkable thyroid gland. UPPER CHEST: Emphysema AORTIC ARCH: There is mild calcific atherosclerosis of the aortic arch. There is no aneurysm, dissection or hemodynamically significant stenosis of the visualized portion of the aorta. Conventional 3 vessel aortic branching pattern. The visualized proximal subclavian arteries are widely patent. RIGHT CAROTID SYSTEM: No dissection, occlusion or aneurysm. There is calcific atherosclerosis extending into the proximal ICA, resulting in less than 50% stenosis. LEFT CAROTID SYSTEM: No dissection, occlusion or aneurysm. Mild atherosclerotic calcification at the carotid bifurcation without hemodynamically significant stenosis. VERTEBRAL ARTERIES: Codominant configuration. Both origins are clearly patent. There is no dissection, occlusion or flow-limiting stenosis to the skull base (V1-V3 segments). CTA HEAD FINDINGS POSTERIOR CIRCULATION: --Vertebral arteries: Normal V4 segments. --Inferior  cerebellar arteries: Normal. --Basilar artery: Normal. --Superior cerebellar arteries:  Normal. --Posterior cerebral arteries (PCA): Normal. ANTERIOR CIRCULATION: --Intracranial internal carotid arteries: Normal. --Anterior cerebral arteries (ACA): Normal. Both A1 segments are present. Patent anterior communicating artery (a-comm). --Middle cerebral arteries (MCA): Normal. VENOUS SINUSES: As permitted by contrast timing, patent. ANATOMIC VARIANTS: None Review of the MIP images confirms the above findings. IMPRESSION: 1. No emergent large vessel occlusion or high-grade stenosis of the head or neck. 2. 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 3. Aortic Atherosclerosis (ICD10-I70.0) and Emphysema (ICD10-J43.9). Electronically Signed   By: Ulyses Jarred M.D.   On: 01/04/2020 01:34     ASSESSMENT:  1. 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 -Hospitalized with severe anemia, weakness and pneumonia and shortness of breath on 09/05/2019 through 09/15/2019. -Myeloma labs from 12/12/2019 shows M spike increased to 0.6 g.  Kappa light chains increased to 93 and ratio increased to 10.96.  2. 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 -H. pylori antibody was positive. Dr. Laural Golden is starting her on antibiotics. -Ultrasound of the abdomen on 11/27/2019 showed increased echogenicity of hepatic parenchyma consistent with steatosis. Mild amount of sludge noted in the gallbladder. 10.3 cm bilobed cyst noted in the left kidney.   PLAN:  1. IgG kappa multiple myeloma: -Darzalex single agent started back on 12/09/2019.  Pomalidomide was discontinued after hospitalization. -She has been tolerating it very well.  So far she received 5 treatments. -Reviewed myeloma panel from 01/06/2020 which showed M spike improved to 0.3 g from 0.6 g.  Kappa light chain  has been stable around 94 although ratio has increased to 21. -We will continue Darzalex single agent weekly for 3 more weeks prior to switching to every 2 weeks. -I plan to repeat myeloma panel in 3 weeks and see her back in 4 weeks.  2. Weight loss: -Continue Marinol 10 mg twice daily.  3. Myeloma bone disease: -Continue Zometa every 3 months.  Continue calcium and vitamin D supplements.  4. CKD: -Continue Lasix 20 mg twice daily. -Creatinine is stable around 1.5.  5. Normocytic anemia: -Hemoglobin dropped to 10.8.  Iron panel on 01/06/2020 shows ferritin of 23 and percent saturation of 18. -I have recommended weekly Feraheme x2.   Orders placed this encounter:  No orders of the defined types were placed in this encounter.    Derek Jack, MD [place] [phone]   I, Milinda Antis, am acting as a scribe for Dr. Sanda Linger.  I, Derek Jack MD, have reviewed the above documentation for accuracy and completeness, and I agree with the above.

## 2020-01-13 NOTE — Telephone Encounter (Signed)
PT would like Ct angio head and CT neck results .Thanks

## 2020-01-13 NOTE — Progress Notes (Signed)
01/13/20  Darzalex Faspro today and weekly for total of 3 weeks due to gap in treatment then will go to q 14 days.  T.O. Dr Rhys Martini, PharmD

## 2020-01-13 NOTE — Progress Notes (Signed)
Patient presents today for treatment and follow up visit with Dr. Delton Coombes. Labs within parameters for treatment. Per ATravis LPN/ Dr. Delton Coombes give Carol Bradley today and proceed with treatment.   Treatment given today per MD orders. Tolerated  without adverse affects. Vital signs stable. No complaints at this time. Discharged from clinic ambulatory. F/U with [facility] as scheduled.

## 2020-01-13 NOTE — Patient Instructions (Addendum)
[place] at [facility] Discharge Instructions  You were seen today by Dr. Delton Coombes. He went over your recent lab results. He will give you 2 weekly doses of Feraheme. Please purchase melatonin over the counter and take it every night. He will see you back in 4 weeks for labs, treatment and follow up.   Thank you for choosing [place] at [facility] to provide your oncology and hematology care.  To afford each patient quality time with our provider, please arrive at least 15 minutes before your scheduled appointment time.   If you have a lab appointment with the [facility] please come in thru the  Main Entrance and check in at the main information desk  You need to re-schedule your appointment should you arrive 10 or more minutes late.  We strive to give you quality time with our providers, and arriving late affects you and other patients whose appointments are after yours.  Also, if you no show three or more times for appointments you may be dismissed from the clinic at the providers discretion.     Again, thank you for choosing [facility].  Our hope is that these requests will decrease the amount of time that you wait before being seen by our physicians.       _____________________________________________________________  Should you have questions after your visit to [facility], please contact our office at [phone] between the hours of 8:00 a.m. and 4:30 p.m.  Voicemails left after 4:00 p.m. will not be returned until the following business day.  For prescription refill requests, have your pharmacy contact our office and allow 72 hours.    Cancer Center Support Programs:   > Cancer Support Group  2nd Tuesday of the month 1pm-2pm, Journey Room

## 2020-01-19 NOTE — Telephone Encounter (Signed)
LEFT vm for patient to call back during business hours for Ct head and Ct neck results.

## 2020-01-19 NOTE — Telephone Encounter (Signed)
Kindly inform patient that CT angiogram of the brain and neck shows no significant large vessel narrowing in the neck of the brain.  Expected changes in the recent right brain stroke.  Nothing to worry about

## 2020-01-20 ENCOUNTER — Inpatient Hospital Stay (HOSPITAL_COMMUNITY): Payer: Medicare Other

## 2020-01-20 ENCOUNTER — Ambulatory Visit (HOSPITAL_COMMUNITY): Payer: Medicare Other

## 2020-01-20 ENCOUNTER — Other Ambulatory Visit: Payer: Self-pay

## 2020-01-20 ENCOUNTER — Encounter (HOSPITAL_COMMUNITY): Payer: Self-pay

## 2020-01-20 VITALS — BP 119/67 | HR 74 | Temp 97.6°F | Resp 18 | Wt 162.2 lb

## 2020-01-20 DIAGNOSIS — C9000 Multiple myeloma not having achieved remission: Secondary | ICD-10-CM

## 2020-01-20 DIAGNOSIS — Z5112 Encounter for antineoplastic immunotherapy: Secondary | ICD-10-CM | POA: Diagnosis not present

## 2020-01-20 DIAGNOSIS — D509 Iron deficiency anemia, unspecified: Secondary | ICD-10-CM

## 2020-01-20 DIAGNOSIS — D638 Anemia in other chronic diseases classified elsewhere: Secondary | ICD-10-CM

## 2020-01-20 DIAGNOSIS — E538 Deficiency of other specified B group vitamins: Secondary | ICD-10-CM

## 2020-01-20 LAB — CBC WITH DIFFERENTIAL/PLATELET
Abs Immature Granulocytes: 0.03 10*3/uL (ref 0.00–0.07)
Basophils Absolute: 0 10*3/uL (ref 0.0–0.1)
Basophils Relative: 0 %
Eosinophils Absolute: 0 10*3/uL (ref 0.0–0.5)
Eosinophils Relative: 0 %
HCT: 39.7 % (ref 36.0–46.0)
Hemoglobin: 12.2 g/dL (ref 12.0–15.0)
Immature Granulocytes: 0 %
Lymphocytes Relative: 12 %
Lymphs Abs: 1.1 10*3/uL (ref 0.7–4.0)
MCH: 29.5 pg (ref 26.0–34.0)
MCHC: 30.7 g/dL (ref 30.0–36.0)
MCV: 95.9 fL (ref 80.0–100.0)
Monocytes Absolute: 0.7 10*3/uL (ref 0.1–1.0)
Monocytes Relative: 8 %
Neutro Abs: 6.9 10*3/uL (ref 1.7–7.7)
Neutrophils Relative %: 80 %
Platelets: 203 10*3/uL (ref 150–400)
RBC: 4.14 MIL/uL (ref 3.87–5.11)
RDW: 16.6 % — ABNORMAL HIGH (ref 11.5–15.5)
WBC: 8.8 10*3/uL (ref 4.0–10.5)
nRBC: 0 % (ref 0.0–0.2)

## 2020-01-20 LAB — COMPREHENSIVE METABOLIC PANEL
ALT: 23 U/L (ref 0–44)
AST: 22 U/L (ref 15–41)
Albumin: 3.2 g/dL — ABNORMAL LOW (ref 3.5–5.0)
Alkaline Phosphatase: 106 U/L (ref 38–126)
Anion gap: 11 (ref 5–15)
BUN: 34 mg/dL — ABNORMAL HIGH (ref 8–23)
CO2: 24 mmol/L (ref 22–32)
Calcium: 8.5 mg/dL — ABNORMAL LOW (ref 8.9–10.3)
Chloride: 107 mmol/L (ref 98–111)
Creatinine, Ser: 1.78 mg/dL — ABNORMAL HIGH (ref 0.44–1.00)
GFR calc Af Amer: 31 mL/min — ABNORMAL LOW (ref >60)
GFR calc non Af Amer: 27 mL/min — ABNORMAL LOW (ref >60)
Glucose, Bld: 147 mg/dL — ABNORMAL HIGH (ref 70–99)
Potassium: 3.6 mmol/L (ref 3.5–5.1)
Sodium: 142 mmol/L (ref 135–145)
Total Bilirubin: 0.6 mg/dL (ref 0.3–1.2)
Total Protein: 6.2 g/dL — ABNORMAL LOW (ref 6.5–8.1)

## 2020-01-20 MED ORDER — DIPHENHYDRAMINE HCL 25 MG PO CAPS: 50.0000 mg | ORAL_CAPSULE | Freq: Once | ORAL | Status: DC

## 2020-01-20 MED ORDER — DEXAMETHASONE 4 MG PO TABS: 10.0000 mg | ORAL_TABLET | Freq: Once | ORAL | Status: DC

## 2020-01-20 MED ORDER — ACETAMINOPHEN 325 MG PO TABS: 650.0000 mg | ORAL_TABLET | Freq: Once | ORAL | Status: DC

## 2020-01-20 MED ORDER — SODIUM CHLORIDE 0.9 % IV SOLN
510.0000 mg | Freq: Once | INTRAVENOUS | Status: AC
  Administered 2020-01-20: 510 mg via INTRAVENOUS
  Filled 2020-01-20: qty 510

## 2020-01-20 MED ORDER — SODIUM CHLORIDE 0.9 % IV SOLN: Freq: Once | INTRAVENOUS | Status: AC

## 2020-01-20 MED ORDER — DARATUMUMAB-HYALURONIDASE-FIHJ 1800-30000 MG-UT/15ML ~~LOC~~ SOLN
1800.0000 mg | Freq: Once | SUBCUTANEOUS | Status: AC
  Administered 2020-01-20: 1800 mg via SUBCUTANEOUS
  Filled 2020-01-20: qty 15

## 2020-01-20 NOTE — Progress Notes (Signed)
Patient presents today for Feraheme infusion and treatment. Labs reviewed by Dr. Delton Coombes. Message received to proceed with treatment today. Creatinine 1.78 . BUN 34. MD aware.   Treatment given today per MD orders. Tolerated without adverse affects. Vital signs stable. No complaints at this time. Discharged from clinic ambulatory. F/U with [facility] as scheduled.

## 2020-01-20 NOTE — Progress Notes (Signed)
01/20/20  OK to proceed with Darzalex Faspro with today's lab results.  T.O. Dr Rhys Martini, PharmD

## 2020-01-20 NOTE — Patient Instructions (Signed)
[facility] Discharge Instructions for Patients Receiving Chemotherapy  Today you received the following chemotherapy agents   To help prevent nausea and vomiting after your treatment, we encourage you to take your nausea medication   If you develop nausea and vomiting that is not controlled by your nausea medication, call the clinic.   BELOW ARE SYMPTOMS THAT SHOULD BE REPORTED IMMEDIATELY:  *FEVER GREATER THAN 100.5 F  *CHILLS WITH OR WITHOUT FEVER  NAUSEA AND VOMITING THAT IS NOT CONTROLLED WITH YOUR NAUSEA MEDICATION  *UNUSUAL SHORTNESS OF BREATH  *UNUSUAL BRUISING OR BLEEDING  TENDERNESS IN MOUTH AND THROAT WITH OR WITHOUT PRESENCE OF ULCERS  *URINARY PROBLEMS  *BOWEL PROBLEMS  UNUSUAL RASH Items with * indicate a potential emergency and should be followed up as soon as possible.  Feel free to call the clinic should you have any questions or concerns. The clinic phone number is [phone].  Please show the CHEMO ALERT CARD at check-in to the Emergency Department and triage nurse.   

## 2020-01-20 NOTE — Telephone Encounter (Signed)
I called patient and gave her the results of the Ct ango head and CT angio neck results. I stated  that CT angiogram of the brain and neck shows no significant large vessel narrowing in the neck of the brain.  Expected changes in the recent right brain stroke.  Nothing to worry about. The pt verbalized understanding.

## 2020-01-21 ENCOUNTER — Other Ambulatory Visit (HOSPITAL_COMMUNITY): Payer: Self-pay | Admitting: Hematology

## 2020-01-26 NOTE — Progress Notes (Signed)
Kindly inform the patient that CT angiogram of the neck and brain does not show any major blockages of the blood vessels to worry about.  Expected changes with time are noted on her stroke on the right side of the brain.  Nothing to worry about

## 2020-01-27 ENCOUNTER — Inpatient Hospital Stay (HOSPITAL_COMMUNITY): Payer: Medicare Other

## 2020-01-27 ENCOUNTER — Encounter (HOSPITAL_COMMUNITY): Payer: Self-pay

## 2020-01-27 ENCOUNTER — Other Ambulatory Visit: Payer: Self-pay

## 2020-01-27 VITALS — BP 100/50 | HR 68 | Temp 97.3°F | Resp 18

## 2020-01-27 DIAGNOSIS — C9000 Multiple myeloma not having achieved remission: Secondary | ICD-10-CM

## 2020-01-27 DIAGNOSIS — Z5112 Encounter for antineoplastic immunotherapy: Secondary | ICD-10-CM | POA: Diagnosis not present

## 2020-01-27 LAB — COMPREHENSIVE METABOLIC PANEL
ALT: 94 U/L — ABNORMAL HIGH (ref 0–44)
AST: 72 U/L — ABNORMAL HIGH (ref 15–41)
Albumin: 3.7 g/dL (ref 3.5–5.0)
Alkaline Phosphatase: 120 U/L (ref 38–126)
Anion gap: 11 (ref 5–15)
BUN: 39 mg/dL — ABNORMAL HIGH (ref 8–23)
CO2: 27 mmol/L (ref 22–32)
Calcium: 9.7 mg/dL (ref 8.9–10.3)
Chloride: 104 mmol/L (ref 98–111)
Creatinine, Ser: 1.94 mg/dL — ABNORMAL HIGH (ref 0.44–1.00)
GFR calc Af Amer: 28 mL/min — ABNORMAL LOW (ref >60)
GFR calc non Af Amer: 24 mL/min — ABNORMAL LOW (ref >60)
Glucose, Bld: 127 mg/dL — ABNORMAL HIGH (ref 70–99)
Potassium: 3.7 mmol/L (ref 3.5–5.1)
Sodium: 142 mmol/L (ref 135–145)
Total Bilirubin: 0.6 mg/dL (ref 0.3–1.2)
Total Protein: 6.5 g/dL (ref 6.5–8.1)

## 2020-01-27 LAB — CBC WITH DIFFERENTIAL/PLATELET
Abs Immature Granulocytes: 0.02 10*3/uL (ref 0.00–0.07)
Basophils Absolute: 0 10*3/uL (ref 0.0–0.1)
Basophils Relative: 0 %
Eosinophils Absolute: 0 10*3/uL (ref 0.0–0.5)
Eosinophils Relative: 0 %
HCT: 41.7 % (ref 36.0–46.0)
Hemoglobin: 13.4 g/dL (ref 12.0–15.0)
Immature Granulocytes: 0 %
Lymphocytes Relative: 11 %
Lymphs Abs: 1 10*3/uL (ref 0.7–4.0)
MCH: 30.7 pg (ref 26.0–34.0)
MCHC: 32.1 g/dL (ref 30.0–36.0)
MCV: 95.6 fL (ref 80.0–100.0)
Monocytes Absolute: 0.8 10*3/uL (ref 0.1–1.0)
Monocytes Relative: 9 %
Neutro Abs: 7.6 10*3/uL (ref 1.7–7.7)
Neutrophils Relative %: 80 %
Platelets: 200 10*3/uL (ref 150–400)
RBC: 4.36 MIL/uL (ref 3.87–5.11)
RDW: 17.8 % — ABNORMAL HIGH (ref 11.5–15.5)
WBC: 9.5 10*3/uL (ref 4.0–10.5)
nRBC: 0 % (ref 0.0–0.2)

## 2020-01-27 LAB — MAGNESIUM: Magnesium: 2.4 mg/dL (ref 1.7–2.4)

## 2020-01-27 LAB — LACTATE DEHYDROGENASE: LDH: 195 U/L — ABNORMAL HIGH (ref 98–192)

## 2020-01-27 MED ORDER — DARATUMUMAB-HYALURONIDASE-FIHJ 1800-30000 MG-UT/15ML ~~LOC~~ SOLN
1800.0000 mg | Freq: Once | SUBCUTANEOUS | Status: AC
  Administered 2020-01-27: 1800 mg via SUBCUTANEOUS
  Filled 2020-01-27: qty 15

## 2020-01-27 MED ORDER — ACETAMINOPHEN 325 MG PO TABS: 650.0000 mg | ORAL_TABLET | Freq: Once | ORAL | Status: DC

## 2020-01-27 MED ORDER — DIPHENHYDRAMINE HCL 25 MG PO CAPS: 50.0000 mg | ORAL_CAPSULE | Freq: Once | ORAL | Status: DC

## 2020-01-27 MED ORDER — DEXAMETHASONE 4 MG PO TABS: 10.0000 mg | ORAL_TABLET | Freq: Once | ORAL | Status: DC

## 2020-01-27 NOTE — Progress Notes (Signed)
01/27/20  OK to proceed without CMET reported as lab is down.  T.O. Dr Rhys Martini, PharmD

## 2020-01-27 NOTE — Progress Notes (Signed)
Patient presents today for treatment. Labs pending. Vital signs are stable. Patient denies pain today. Patient has complaints of fatigue. Patient denies any significant changes since her last visit. Patient denies blurred vision, headaches or dizziness. Patient states she feels short of breath with exertion.   Message received from Dr. Delton Coombes okay to treat without CMP. CBC/d within parameters for treatment today.   Treatment given today per MD orders. Tolerated without adverse affects. Vital signs stable. No complaints at this time. Discharged from clinic ambulatory. F/U with [facility] as scheduled.

## 2020-01-27 NOTE — Patient Instructions (Signed)
[facility] Discharge Instructions for Patients Receiving Chemotherapy  Today you received the following chemotherapy agents   To help prevent nausea and vomiting after your treatment, we encourage you to take your nausea medication   If you develop nausea and vomiting that is not controlled by your nausea medication, call the clinic.   BELOW ARE SYMPTOMS THAT SHOULD BE REPORTED IMMEDIATELY:  *FEVER GREATER THAN 100.5 F  *CHILLS WITH OR WITHOUT FEVER  NAUSEA AND VOMITING THAT IS NOT CONTROLLED WITH YOUR NAUSEA MEDICATION  *UNUSUAL SHORTNESS OF BREATH  *UNUSUAL BRUISING OR BLEEDING  TENDERNESS IN MOUTH AND THROAT WITH OR WITHOUT PRESENCE OF ULCERS  *URINARY PROBLEMS  *BOWEL PROBLEMS  UNUSUAL RASH Items with * indicate a potential emergency and should be followed up as soon as possible.  Feel free to call the clinic should you have any questions or concerns. The clinic phone number is [phone].  Please show the CHEMO ALERT CARD at check-in to the Emergency Department and triage nurse.   

## 2020-01-27 NOTE — Progress Notes (Signed)
Labs reviewed by Dr. Delton Coombes. Verbal orders received to reschedule patient for a lab appointment on 02/02/20 to recheck liver enzymes.

## 2020-01-28 LAB — KAPPA/LAMBDA LIGHT CHAINS
Kappa free light chain: 140.6 mg/L — ABNORMAL HIGH (ref 3.3–19.4)
Kappa, lambda light chain ratio: 20.99 — ABNORMAL HIGH (ref 0.26–1.65)
Lambda free light chains: 6.7 mg/L (ref 5.7–26.3)

## 2020-01-28 LAB — PROTEIN ELECTROPHORESIS, SERUM
A/G Ratio: 1.3 (ref 0.7–1.7)
Albumin ELP: 3.2 g/dL (ref 2.9–4.4)
Alpha-1-Globulin: 0.2 g/dL (ref 0.0–0.4)
Alpha-2-Globulin: 0.8 g/dL (ref 0.4–1.0)
Beta Globulin: 0.8 g/dL (ref 0.7–1.3)
Gamma Globulin: 0.6 g/dL (ref 0.4–1.8)
Globulin, Total: 2.4 g/dL (ref 2.2–3.9)
M-Spike, %: 0.5 g/dL — ABNORMAL HIGH
Total Protein ELP: 5.6 g/dL — ABNORMAL LOW (ref 6.0–8.5)

## 2020-01-28 NOTE — Progress Notes (Signed)
01/28/20 08:59 Called and spoke with patient to inform her of her scheduled appointment for lab work Monday, February 02, 2020 at 11:00 am for repeat labs per Dr. Delton Coombes.

## 2020-02-02 ENCOUNTER — Other Ambulatory Visit: Payer: Self-pay

## 2020-02-02 ENCOUNTER — Inpatient Hospital Stay (HOSPITAL_COMMUNITY): Payer: Medicare Other

## 2020-02-02 DIAGNOSIS — C9000 Multiple myeloma not having achieved remission: Secondary | ICD-10-CM

## 2020-02-02 DIAGNOSIS — Z5112 Encounter for antineoplastic immunotherapy: Secondary | ICD-10-CM | POA: Diagnosis not present

## 2020-02-02 LAB — CBC WITH DIFFERENTIAL/PLATELET
Abs Immature Granulocytes: 0.04 10*3/uL (ref 0.00–0.07)
Basophils Absolute: 0 10*3/uL (ref 0.0–0.1)
Basophils Relative: 0 %
Eosinophils Absolute: 0 10*3/uL (ref 0.0–0.5)
Eosinophils Relative: 0 %
HCT: 38 % (ref 36.0–46.0)
Hemoglobin: 11.7 g/dL — ABNORMAL LOW (ref 12.0–15.0)
Immature Granulocytes: 0 %
Lymphocytes Relative: 11 %
Lymphs Abs: 1 10*3/uL (ref 0.7–4.0)
MCH: 30.2 pg (ref 26.0–34.0)
MCHC: 30.8 g/dL (ref 30.0–36.0)
MCV: 98.2 fL (ref 80.0–100.0)
Monocytes Absolute: 1 10*3/uL (ref 0.1–1.0)
Monocytes Relative: 11 %
Neutro Abs: 6.9 10*3/uL (ref 1.7–7.7)
Neutrophils Relative %: 78 %
Platelets: 146 10*3/uL — ABNORMAL LOW (ref 150–400)
RBC: 3.87 MIL/uL (ref 3.87–5.11)
RDW: 18.1 % — ABNORMAL HIGH (ref 11.5–15.5)
WBC: 9 10*3/uL (ref 4.0–10.5)
nRBC: 0 % (ref 0.0–0.2)

## 2020-02-02 LAB — COMPREHENSIVE METABOLIC PANEL
ALT: 27 U/L (ref 0–44)
AST: 19 U/L (ref 15–41)
Albumin: 3.1 g/dL — ABNORMAL LOW (ref 3.5–5.0)
Alkaline Phosphatase: 94 U/L (ref 38–126)
Anion gap: 7 (ref 5–15)
BUN: 27 mg/dL — ABNORMAL HIGH (ref 8–23)
CO2: 25 mmol/L (ref 22–32)
Calcium: 8.2 mg/dL — ABNORMAL LOW (ref 8.9–10.3)
Chloride: 109 mmol/L (ref 98–111)
Creatinine, Ser: 1.24 mg/dL — ABNORMAL HIGH (ref 0.44–1.00)
GFR calc Af Amer: 48 mL/min — ABNORMAL LOW (ref >60)
GFR calc non Af Amer: 41 mL/min — ABNORMAL LOW (ref >60)
Glucose, Bld: 104 mg/dL — ABNORMAL HIGH (ref 70–99)
Potassium: 4.1 mmol/L (ref 3.5–5.1)
Sodium: 141 mmol/L (ref 135–145)
Total Bilirubin: 0.6 mg/dL (ref 0.3–1.2)
Total Protein: 5.8 g/dL — ABNORMAL LOW (ref 6.5–8.1)

## 2020-02-03 ENCOUNTER — Ambulatory Visit (HOSPITAL_COMMUNITY): Payer: Medicare Other

## 2020-02-03 ENCOUNTER — Other Ambulatory Visit (HOSPITAL_COMMUNITY): Payer: Medicare Other

## 2020-02-09 ENCOUNTER — Ambulatory Visit (INDEPENDENT_AMBULATORY_CARE_PROVIDER_SITE_OTHER): Payer: Medicare Other | Admitting: Internal Medicine

## 2020-02-09 ENCOUNTER — Encounter: Payer: Self-pay | Admitting: Internal Medicine

## 2020-02-09 ENCOUNTER — Other Ambulatory Visit: Payer: Self-pay

## 2020-02-09 VITALS — BP 122/64 | HR 67 | Ht 67.0 in | Wt 165.2 lb

## 2020-02-09 DIAGNOSIS — I639 Cerebral infarction, unspecified: Secondary | ICD-10-CM | POA: Diagnosis not present

## 2020-02-09 NOTE — Progress Notes (Signed)
PCP: [name], MD Primary Cardiologist:  Dr Martinique Primary EP: Dr Rayann Heman  Carol Bradley is a 79 y.o. 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   She was found by Dr Lorelee Cover to have R MCA infarct on MRI. Echo revealed normal EF.  She does not have a known history of afib.  She has been started on ASA.  There is concern for cardioembolic source.  She is referred to consider ILR.   Today, she denies symptoms of palpitations, chest pain, shortness of breath,  lower extremity edema, dizziness, presyncope, or syncope.  The patient is otherwise without complaint today.   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   Multiple myeloma (HCC)    Vitamin B12 deficiency 04/15/2016   Overview:  Vitamin B12 level documented 155, January 2016 with the normal range being 211-924   Past Surgical History:  Procedure Laterality Date   BACK SURGERY     CARDIAC CATHETERIZATION  04/2011   right and left cath showing normal right heart pressures,but newly diagnosed coronary artery disease/drug eluting stent placed to RCA with residual disease in the proximal RCA and LAD and ramus, normal LV function and 60-65% EF   COLONOSCOPY N/A 09/30/2014   Procedure: COLONOSCOPY;  Surgeon: Rogene Houston, MD;  [facility]: [facility];  Service: Endoscopy;  Laterality: N/A;  225   CORONARY ANGIOPLASTY WITH STENT PLACEMENT  04/2011   mid RCA: 3.0 X38 mm Promus DES. Residual 40% disease proximally   ESOPHAGOGASTRODUODENOSCOPY N/A 09/10/2019   Procedure: ESOPHAGOGASTRODUODENOSCOPY (EGD);  Surgeon: Clarene Essex, MD;  Location: [place];  Service: Endoscopy;  Laterality: N/A;   NECK SURGERY       ROS- all systems are reviewed and negatives except as per HPI above  Current Outpatient Medications  Medication Sig Dispense Refill   albuterol (VENTOLIN HFA) 108 (90 Base) MCG/ACT inhaler Inhale 2 puffs into the lungs every 6 (six) hours as needed for wheezing or shortness of breath. 18 g 2   aspirin 325 MG tablet Take 325 mg by mouth at bedtime.     atorvastatin (LIPITOR) 10 MG tablet TAKE ONE TABLET BY MOUTH DAILY 90 tablet 1   Calcium Carbonate-Vit D-Min (CALCIUM 600+D PLUS MINERALS) 600-400 MG-UNIT CHEW Chew 1 tablet by mouth 3 (three) times daily.      cyanocobalamin (,VITAMIN B-12,) 1000 MCG/ML injection Inject 1,000 mcg into the skin every 30 (thirty) days.      daratumumab-hyaluronidase-fihj (DARZALEX FASPRO) 1800-30000 MG-UT/15ML SOLN Inject 1,800 mg into the skin once.     dexamethasone (DECADRON) 2 MG tablet Take 5 tablets once weekly. 30 tablet 3   diazepam (VALIUM) 5 MG tablet TAKE ONE TABLET BY MOUTH EVERY 6 HOURS AS NEEDED FOR ANXIETY 30 tablet 1   diltiazem (CARDIZEM CD) 240 MG 24 hr capsule TAKE ONE CAPSULE BY MOUTH EVERY DAY 90 capsule 3   famotidine (PEPCID) 20 MG tablet Take 20 mg by mouth 2 (two) times daily as needed.      fluticasone (FLONASE) 50 MCG/ACT nasal spray Place 2 sprays into both nostrils as needed.      furosemide (LASIX) 20 MG tablet Take 40 mg by  mouth daily.      ipratropium (ATROVENT) 0.06 % nasal spray SMARTSIG:2 Spray(s) Both Nares Twice Daily PRN     magnesium oxide (MAG-OX) 400 (241.3 Mg) MG tablet TAKE ONE TABLET BY MOUTH TWICE DAILY. WHEN TAKING LASIX 60 tablet 2   magnesium oxide (MAG-OX) 400 MG tablet Take 1 tablet by mouth 2 (two) times daily.     metoprolol tartrate (LOPRESSOR) 25 MG tablet TAKE 1 AND 1/2 TABLETS BY MOUTH TWICE DAILY. 75 tablet 3   ondansetron (ZOFRAN) 8 MG tablet Take 1 tablet (8 mg total) by mouth every 8 (eight) hours as needed for nausea or vomiting. 20 tablet 0   pantoprazole (PROTONIX) 40 MG tablet  Take 1 tablet (40 mg total) by mouth 2 (two) times daily. 60 tablet 0   polyethylene glycol powder (GLYCOLAX/MIRALAX) 17 GM/SCOOP powder Take by mouth as needed.      potassium chloride (KLOR-CON) 10 MEQ tablet TAKE ONE TABLET BY MOUTH THREE TIMES DAILY. 90 tablet 4   tetracycline (SUMYCIN) 500 MG capsule Take 500 mg by mouth 2 (two) times daily.      umeclidinium-vilanterol (ANORO ELLIPTA) 62.5-25 MCG/INH AEPB Inhale 1 puff into the lungs daily. 60 each 1   No current facility-administered medications for this visit.    Physical Exam: Vitals:   02/09/20 1049  BP: (!) 122/64  Pulse: 67  SpO2: 97%  Weight: 165 lb 3.2 oz (74.9 kg)  Height: _0  (1.702 m)    GEN- The patient is well appearing, alert and oriented x 3 today.   Head- normocephalic, atraumatic Eyes-  Sclera clear, conjunctiva pink Ears- hearing intact Oropharynx- clear Lungs- Clear to ausculation bilaterally, normal work of breathing Heart- Regular rate and rhythm, no murmurs, rubs or gallops, PMI not laterally displaced GI- soft, NT, ND, + BS Extremities- no clubbing, cyanosis, or edema  Wt Readings from Last 3 Encounters:  02/09/20 165 lb 3.2 oz (74.9 kg)  01/20/20 162 lb 3.2 oz (73.6 kg)  01/13/20 165 lb 1.6 oz (74.9 kg)    EKG tracing ordered today is personally reviewed and shows sinus rhythm   Assessment and Plan:  1. Cryptogenic stroke The patient has had embolic stroke of unknown cause.  I agree with Dr Leonie Man that ILR is indicated to evaluate for afib. Risks and benefits to ILR were discussed at length with the patient today, including but not limited to risks of bleeding and infection.  Extensive device education was performed.  Remote monitoring was also discussed at length today.  The patient understands and wishes to discuss further with her son.  She will contact my office if she decides to proceed.  She will follow-up with Dr Martinique and I will see as needed   Thompson Grayer MD,  [facility] 02/09/2020 11:07 AM

## 2020-02-09 NOTE — Patient Instructions (Addendum)
Medication Instructions:  Your physician recommends that you continue on your current medications as directed. Please refer to the Current Medication list given to you today.  *If you need a refill on your cardiac medications before your next appointment, please call your pharmacy*  Lab Work: None ordered.  If you have labs (blood work) drawn today and your tests are completely normal, you will receive your results only by: Marland Kitchen MyChart Message (if you have MyChart) OR . A paper copy in the mail If you have any lab test that is abnormal or we need to change your treatment, we will call you to review the results.  Testing/Procedures: None ordered.  Follow-Up: At [facility], you and your health needs are our priority.  As part of our continuing mission to provide you with exceptional heart care, we have created designated Provider Care Teams.  These Care Teams include your primary Cardiologist (physician) and Advanced Practice Providers (APPs -  Physician Assistants and Nurse Practitioners) who all work together to provide you with the care you need, when you need it.  We recommend signing up for the patient portal called "MyChart".  Sign up information is provided on this After Visit Summary.  MyChart is used to connect with patients for Virtual Visits (Telemedicine).  Patients are able to view lab/test results, encounter notes, upcoming appointments, etc.  Non-urgent messages can be sent to your provider as well.   To learn more about what you can do with MyChart, go to [email or web address].    Your next appointment:   Your physician wants you to follow-up in: call when ready to schedule loop implant.    Other Instructions:

## 2020-02-10 ENCOUNTER — Ambulatory Visit (HOSPITAL_COMMUNITY): Payer: Medicare Other | Admitting: Hematology

## 2020-02-10 ENCOUNTER — Inpatient Hospital Stay (HOSPITAL_COMMUNITY): Payer: Medicare Other

## 2020-02-10 ENCOUNTER — Ambulatory Visit (HOSPITAL_COMMUNITY): Payer: Medicare Other

## 2020-02-10 ENCOUNTER — Inpatient Hospital Stay (HOSPITAL_COMMUNITY): Payer: Medicare Other | Attending: Hematology

## 2020-02-10 ENCOUNTER — Other Ambulatory Visit (HOSPITAL_COMMUNITY): Payer: Medicare Other

## 2020-02-10 ENCOUNTER — Inpatient Hospital Stay (HOSPITAL_BASED_OUTPATIENT_CLINIC_OR_DEPARTMENT_OTHER): Payer: Medicare Other | Admitting: Hematology

## 2020-02-10 VITALS — BP 135/59 | HR 71 | Temp 97.5°F | Resp 16 | Wt 164.4 lb

## 2020-02-10 DIAGNOSIS — R634 Abnormal weight loss: Secondary | ICD-10-CM | POA: Insufficient documentation

## 2020-02-10 DIAGNOSIS — J449 Chronic obstructive pulmonary disease, unspecified: Secondary | ICD-10-CM | POA: Diagnosis not present

## 2020-02-10 DIAGNOSIS — C9000 Multiple myeloma not having achieved remission: Secondary | ICD-10-CM

## 2020-02-10 DIAGNOSIS — R5382 Chronic fatigue, unspecified: Secondary | ICD-10-CM | POA: Diagnosis not present

## 2020-02-10 DIAGNOSIS — Z7982 Long term (current) use of aspirin: Secondary | ICD-10-CM | POA: Diagnosis not present

## 2020-02-10 DIAGNOSIS — E538 Deficiency of other specified B group vitamins: Secondary | ICD-10-CM

## 2020-02-10 DIAGNOSIS — I129 Hypertensive chronic kidney disease with stage 1 through stage 4 chronic kidney disease, or unspecified chronic kidney disease: Secondary | ICD-10-CM | POA: Diagnosis not present

## 2020-02-10 DIAGNOSIS — I2510 Atherosclerotic heart disease of native coronary artery without angina pectoris: Secondary | ICD-10-CM | POA: Diagnosis not present

## 2020-02-10 DIAGNOSIS — Z87891 Personal history of nicotine dependence: Secondary | ICD-10-CM | POA: Insufficient documentation

## 2020-02-10 DIAGNOSIS — Z79899 Other long term (current) drug therapy: Secondary | ICD-10-CM | POA: Diagnosis not present

## 2020-02-10 DIAGNOSIS — Z9221 Personal history of antineoplastic chemotherapy: Secondary | ICD-10-CM | POA: Diagnosis not present

## 2020-02-10 DIAGNOSIS — Z5112 Encounter for antineoplastic immunotherapy: Secondary | ICD-10-CM | POA: Insufficient documentation

## 2020-02-10 DIAGNOSIS — D509 Iron deficiency anemia, unspecified: Secondary | ICD-10-CM

## 2020-02-10 DIAGNOSIS — E785 Hyperlipidemia, unspecified: Secondary | ICD-10-CM | POA: Insufficient documentation

## 2020-02-10 LAB — COMPREHENSIVE METABOLIC PANEL
ALT: 21 U/L (ref 0–44)
AST: 22 U/L (ref 15–41)
Albumin: 3.5 g/dL (ref 3.5–5.0)
Alkaline Phosphatase: 78 U/L (ref 38–126)
Anion gap: 10 (ref 5–15)
BUN: 35 mg/dL — ABNORMAL HIGH (ref 8–23)
CO2: 28 mmol/L (ref 22–32)
Calcium: 8.9 mg/dL (ref 8.9–10.3)
Chloride: 106 mmol/L (ref 98–111)
Creatinine, Ser: 1.55 mg/dL — ABNORMAL HIGH (ref 0.44–1.00)
GFR calc Af Amer: 37 mL/min — ABNORMAL LOW (ref >60)
GFR calc non Af Amer: 32 mL/min — ABNORMAL LOW (ref >60)
Glucose, Bld: 112 mg/dL — ABNORMAL HIGH (ref 70–99)
Potassium: 3.5 mmol/L (ref 3.5–5.1)
Sodium: 144 mmol/L (ref 135–145)
Total Bilirubin: 0.7 mg/dL (ref 0.3–1.2)
Total Protein: 6.3 g/dL — ABNORMAL LOW (ref 6.5–8.1)

## 2020-02-10 LAB — CBC WITH DIFFERENTIAL/PLATELET
Abs Immature Granulocytes: 0.07 10*3/uL (ref 0.00–0.07)
Basophils Absolute: 0 10*3/uL (ref 0.0–0.1)
Basophils Relative: 0 %
Eosinophils Absolute: 0 10*3/uL (ref 0.0–0.5)
Eosinophils Relative: 0 %
HCT: 40.2 % (ref 36.0–46.0)
Hemoglobin: 12.5 g/dL (ref 12.0–15.0)
Immature Granulocytes: 1 %
Lymphocytes Relative: 13 %
Lymphs Abs: 1.4 10*3/uL (ref 0.7–4.0)
MCH: 30.7 pg (ref 26.0–34.0)
MCHC: 31.1 g/dL (ref 30.0–36.0)
MCV: 98.8 fL (ref 80.0–100.0)
Monocytes Absolute: 1.1 10*3/uL — ABNORMAL HIGH (ref 0.1–1.0)
Monocytes Relative: 10 %
Neutro Abs: 7.9 10*3/uL — ABNORMAL HIGH (ref 1.7–7.7)
Neutrophils Relative %: 76 %
Platelets: 214 10*3/uL (ref 150–400)
RBC: 4.07 MIL/uL (ref 3.87–5.11)
RDW: 19 % — ABNORMAL HIGH (ref 11.5–15.5)
WBC: 10.5 10*3/uL (ref 4.0–10.5)
nRBC: 0.2 % (ref 0.0–0.2)

## 2020-02-10 MED ORDER — DIPHENHYDRAMINE HCL 25 MG PO CAPS: 50.0000 mg | ORAL_CAPSULE | Freq: Once | ORAL | Status: DC

## 2020-02-10 MED ORDER — CYANOCOBALAMIN 1000 MCG/ML IJ SOLN
INTRAMUSCULAR | Status: AC
  Filled 2020-02-10: qty 1

## 2020-02-10 MED ORDER — LENALIDOMIDE 10 MG PO CAPS: 10.0000 mg | ORAL_CAPSULE | Freq: Every day | ORAL | 0 refills | Status: DC

## 2020-02-10 MED ORDER — DEXAMETHASONE 4 MG PO TABS: 10.0000 mg | ORAL_TABLET | Freq: Once | ORAL | Status: DC

## 2020-02-10 MED ORDER — DARATUMUMAB-HYALURONIDASE-FIHJ 1800-30000 MG-UT/15ML ~~LOC~~ SOLN
1800.0000 mg | Freq: Once | SUBCUTANEOUS | Status: AC
  Administered 2020-02-10: 1800 mg via SUBCUTANEOUS
  Filled 2020-02-10: qty 15

## 2020-02-10 MED ORDER — CYANOCOBALAMIN 1000 MCG/ML IJ SOLN
1000.0000 ug | Freq: Once | INTRAMUSCULAR | Status: AC
  Administered 2020-02-10: 1000 ug via INTRAMUSCULAR

## 2020-02-10 MED ORDER — ACETAMINOPHEN 325 MG PO TABS: 650.0000 mg | ORAL_TABLET | Freq: Once | ORAL | Status: DC

## 2020-02-10 NOTE — Progress Notes (Signed)
Carol Bradley, Carol Bradley [id number]   CLINIC:  Medical Oncology/Hematology  PCP:  Carol Chroman, MD [address] / [place] [address] [phone]   REASON FOR VISIT:  Follow-up for multiple myeloma & IDA  PRIOR THERAPY: Darzalex and Pomalyst from 07/28/2019 to 08/25/2019  NGS Results: Not done  CURRENT THERAPY: Darzalex Faspro; Feraheme  BRIEF ONCOLOGIC HISTORY:  Oncology History  Multiple myeloma ([place])  02/26/2015 Initial Biopsy   BMBX 50% cellularity, igG kappa myeloma, IgG at 3600 mg/dl, FISH with monosomy of chromosome 13, gain 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    03/18/2015 - 07/28/2015 Chemotherapy   Velcade 1.6 mg/m2 discontinued secondary to intolerance   07/28/2015 Adverse Reaction   stool incontinence, weakness, collapse upon standing, felt to be secondary to velcade   11/09/2015 - 12/13/2015 Chemotherapy   cytoxan IV 300 mg/m2 administered X 2 doses only in addition to rev/dex   11/09/2015 - 06/08/2016 Chemotherapy   Revlimid/Dexamethasone    02/05/2017 - 07/09/2017 Chemotherapy   The patient had bortezomib SQ (VELCADE) chemo injection 1.75 mg, 1 mg/m2 = 1.75 mg (66.7 % of original dose 1.5 mg/m2), Subcutaneous,  Once, 1 of 8 cycles Dose modification: 1.5 mg/m2 (original dose 1.5 mg/m2, Cycle 1, Reason: Provider Judgment, Comment: Per Dr. Norma Bradley recommendations from [facility] 1 mg/m2 (original dose 1.5 mg/m2, Cycle 1, Reason: Provider Judgment)  for chemotherapy treatment.     07/09/2017 Adverse Reaction   Diarrhea    Chemotherapy   Pomalyst 2 mg (Days 1-21 every 28 days), Ixazomib 2.3 mg (days 1, 8, 15 every 28 days), and Dexamethasone 10 mg (weekly)- Rx's printed on 08/24/2017.  Treatment recommendations from Dr. Norma Bradley at [facility].   07/28/2019 -  Chemotherapy   The patient had dexamethasone (DECADRON) tablet 20 mg, 20 mg, Oral, Once, 3 of 10 cycles Dose modification: 10 mg (original dose 10 mg, Cycle  1) Administration: 20 mg (07/28/2019), 10 mg (08/04/2019), 10 mg (08/11/2019), 10 mg (08/25/2019), 10 mg (12/09/2019), 10 mg (12/16/2019), 10 mg (12/23/2019), 10 mg (12/30/2019), 10 mg (01/06/2020), 10 mg (01/13/2020) daratumumab-hyaluronidase-fihj (DARZALEX FASPRO) 1800-30000 MG-UT/15ML chemo SQ injection 1,800 mg, 1,800 mg, Subcutaneous,  Once, 3 of 10 cycles Administration: 1,800 mg (07/28/2019), 1,800 mg (08/04/2019), 1,800 mg (08/11/2019), 1,800 mg (08/25/2019), 1,800 mg (12/09/2019), 1,800 mg (12/16/2019), 1,800 mg (12/23/2019), 1,800 mg (12/30/2019), 1,800 mg (01/06/2020), 1,800 mg (01/13/2020), 1,800 mg (01/20/2020), 1,800 mg (01/27/2020)  for chemotherapy treatment.      CANCER STAGING: Cancer Staging No matching staging information was found for the patient.  INTERVAL HISTORY:  Carol Bradley, a 79 y.o. female, returns for routine follow-up and consideration for next cycle of chemotherapy. Carol Bradley was last seen on 01/13/2020.  Due for cycle #4 of Darzalex Faspro today.   Overall, she tells me she has been feeling pretty well. She had a recent accidental fall, but she denies having any weakness. She is tolerating the treatments and iron transfusion well. She denies having any cough. She is complaining of worsening vision and is thinking it is due to the dexamethasone.  Overall, she feels ready for next cycle of chemo today.    REVIEW OF SYSTEMS:  Review of Systems  Constitutional: Positive for fatigue (moderate). Negative for appetite change.  HENT:         Otalgia  Cardiovascular: Positive for palpitations.  Hematological: Bruises/bleeds easily (bruising from recent fall).  All other systems reviewed and are negative.   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   Multiple myeloma (HCC)    Vitamin B12 deficiency 04/15/2016   Overview:  Vitamin B12 level documented 155, January 2016 with the normal range being 211-924   Past Surgical History:  Procedure Laterality Date   BACK SURGERY     CARDIAC CATHETERIZATION  04/2011   right and left cath showing normal right heart pressures,but newly diagnosed coronary artery disease/drug eluting stent placed to RCA with residual disease in the proximal RCA and LAD and ramus, normal LV function and 60-65% EF   COLONOSCOPY N/A 09/30/2014   Procedure: COLONOSCOPY;  Surgeon: Carol Hippo, MD;  [facility]: [facility];  Service: Endoscopy;  Laterality: N/A;  225   CORONARY ANGIOPLASTY WITH STENT PLACEMENT  04/2011   mid RCA: 3.0 X38 mm Promus DES. 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  Tobacco Use   Smoking status: Former Smoker    Packs/day: 3.00    Years: 25.00    Pack years: 75.00    Types: Cigarettes    Quit date: 07/10/1994    Years since quitting: 25.6   Smokeless tobacco: Never Used  Vaping Use   Vaping Use: Never used  Substance and Sexual Activity   Alcohol use: No   Drug use: Never   Sexual activity: Not on file  Other Topics Concern   Not on file  Social History Narrative   Not on file   Social Determinants of Health   Financial Resource Strain:    Difficulty of Paying Living Expenses:   Food Insecurity: No Food Insecurity    Worried About Running Out of Food in the Last Year: Never true   Ran Out of Food in the Last Year: Never true  Transportation Needs: No Transportation Needs   Lack of Transportation (Medical): No   Lack of Transportation (Non-Medical): No  Physical Activity:    Days of Exercise per Week:    Minutes of Exercise per Session:   Stress:    Feeling of Stress :   Social Connections:    Frequency of Communication with Friends and Family:    Frequency of Social Gatherings with Friends and Family:    Attends Religious Services:    Active Member of Clubs or Organizations:    Attends Engineer, structural:    Marital Status:   Intimate Partner Violence:    Fear of Current or Ex-Partner:    Emotionally Abused:    Physically Abused:    Sexually Abused:     FAMILY HISTORY:  Family History  Problem Relation Age of Onset   Heart failure Mother    Cancer Father     CURRENT MEDICATIONS:  Current  Outpatient Medications  Medication Sig Dispense Refill   albuterol (VENTOLIN HFA) 108 (90 Base) MCG/ACT inhaler Inhale 2 puffs into the lungs every 6 (six) hours as needed for wheezing or shortness of breath. 18 g 2   aspirin 325 MG tablet Take 325 mg by mouth at bedtime.     atorvastatin (LIPITOR) 10 MG tablet TAKE ONE TABLET BY MOUTH DAILY 90 tablet 1   Calcium Carbonate-Vit D-Min (CALCIUM 600+D PLUS MINERALS) 600-400 MG-UNIT CHEW Chew 1 tablet by mouth 3 (three) times daily.      cyanocobalamin (,VITAMIN B-12,) 1000 MCG/ML injection Inject 1,000 mcg into the skin every 30 (thirty) days.      daratumumab-hyaluronidase-fihj (DARZALEX FASPRO) 1800-30000 MG-UT/15ML SOLN Inject 1,800 mg into the skin once.     dexamethasone (DECADRON) 2 MG tablet Take 5 tablets once weekly. 30 tablet 3   diazepam (VALIUM) 5 MG tablet TAKE ONE TABLET BY MOUTH EVERY 6 HOURS AS NEEDED FOR ANXIETY 30 tablet 1   diltiazem (CARDIZEM CD) 240 MG 24 hr capsule TAKE ONE CAPSULE BY MOUTH  EVERY DAY 90 capsule 3   famotidine (PEPCID) 20 MG tablet Take 20 mg by mouth 2 (two) times daily as needed.      fluticasone (FLONASE) 50 MCG/ACT nasal spray Place 2 sprays into both nostrils as needed.      furosemide (LASIX) 20 MG tablet Take 40 mg by mouth daily.      ipratropium (ATROVENT) 0.06 % nasal spray SMARTSIG:2 Spray(s) Both Nares Twice Daily PRN     magnesium oxide (MAG-OX) 400 (241.3 Mg) MG tablet TAKE ONE TABLET BY MOUTH TWICE DAILY. WHEN TAKING LASIX 60 tablet 2   magnesium oxide (MAG-OX) 400 MG tablet Take 1 tablet by mouth 2 (two) times daily.     metoprolol tartrate (LOPRESSOR) 25 MG tablet TAKE 1 AND 1/2 TABLETS BY MOUTH TWICE DAILY. 75 tablet 3   ondansetron (ZOFRAN) 8 MG tablet Take 1 tablet (8 mg total) by mouth every 8 (eight) hours as needed for nausea or vomiting. 20 tablet 0   pantoprazole (PROTONIX) 40 MG tablet Take 1 tablet (40 mg total) by mouth 2 (two) times daily. 60 tablet 0   polyethylene glycol powder (GLYCOLAX/MIRALAX) 17 GM/SCOOP powder Take by mouth as needed.      potassium chloride (KLOR-CON) 10 MEQ tablet TAKE ONE TABLET BY MOUTH THREE TIMES DAILY. 90 tablet 4   tetracycline (SUMYCIN) 500 MG capsule Take 500 mg by mouth 2 (two) times daily.      umeclidinium-vilanterol (ANORO ELLIPTA) 62.5-25 MCG/INH AEPB Inhale 1 puff into the lungs daily. 60 each 1   No current facility-administered medications for this visit.    ALLERGIES:  Allergies  Allergen Reactions   Lisinopril Cough   Plavix [Clopidogrel Bisulfate] Swelling    PHYSICAL EXAM:  Performance status (ECOG): 1 - Symptomatic but completely ambulatory  There were no vitals filed for this visit. Wt Readings from Last 3 Encounters:  02/09/20 165 lb 3.2 oz (74.9 kg)  01/20/20 162 lb 3.2 oz (73.6 kg)  01/13/20 165 lb 1.6 oz (74.9 kg)   Physical Exam Vitals reviewed.  Constitutional:      Appearance: Normal appearance.  Cardiovascular:     Rate and Rhythm: Normal rate and  regular rhythm.     Pulses: Normal pulses.     Heart sounds: Normal heart sounds.  Pulmonary:     Effort: Pulmonary effort is normal.     Breath sounds: Normal breath sounds.  Musculoskeletal:  Left forearm: Laceration (skin abrasion on mid forearm post fall) present.     Right lower leg: No edema.     Left lower leg: No edema.  Neurological:     General: No focal deficit present.     Mental Status: She is alert and oriented to person, place, and time.  Psychiatric:        Mood and Affect: Mood normal.        Behavior: Behavior normal.     LABORATORY DATA:  I have reviewed the labs as listed.  CBC Latest Ref Rng & Units 02/10/2020 02/02/2020 01/27/2020  WBC 4.0 - 10.5 K/uL 10.5 9.0 9.5  Hemoglobin 12.0 - 15.0 g/dL 12.5 11.7(L) 13.4  Hematocrit 36 - 46 % 40.2 38.0 41.7  Platelets 150 - 400 K/uL 214 146(L) 200   CMP Latest Ref Rng & Units 02/10/2020 02/02/2020 01/27/2020  Glucose 70 - 99 mg/dL 112(H) 104(H) 127(H)  BUN 8 - 23 mg/dL 35(H) 27(H) 39(H)  Creatinine 0.44 - 1.00 mg/dL 1.55(H) 1.24(H) 1.94(H)  Sodium 135 - 145 mmol/L 144 141 142  Potassium 3.5 - 5.1 mmol/L 3.5 4.1 3.7  Chloride 98 - 111 mmol/L 106 109 104  CO2 22 - 32 mmol/L '28 25 27  '$ Calcium 8.9 - 10.3 mg/dL 8.9 8.2(L) 9.7  Total Protein 6.5 - 8.1 g/dL 6.3(L) 5.8(L) 6.5  Total Bilirubin 0.3 - 1.2 mg/dL 0.7 0.6 0.6  Alkaline Phos 38 - 126 U/L 78 94 120  AST 15 - 41 U/L 22 19 72(H)  ALT 0 - 44 U/L 21 27 94(H)   Lab Results  Component Value Date   LDH 195 (H) 01/27/2020   LDH 157 01/06/2020   LDH 169 12/12/2019   Lab Results  Component Value Date   TOTALPROTELP 5.6 (L) 01/27/2020   ALBUMINELP 3.2 01/27/2020   A1GS 0.2 01/27/2020   A2GS 0.8 01/27/2020   BETS 0.8 01/27/2020   GAMS 0.6 01/27/2020   MSPIKE 0.5 (H) 01/27/2020   SPEI Comment 01/27/2020    Lab Results  Component Value Date   KPAFRELGTCHN 140.6 (H) 01/27/2020   LAMBDASER 6.7 01/27/2020   KAPLAMBRATIO 20.99 (H) 01/27/2020   Lab Results   Component Value Date   TIBC 275 01/06/2020   TIBC 309 08/04/2019   TIBC 308 03/04/2019   FERRITIN 23 01/06/2020   FERRITIN 62 08/04/2019   FERRITIN 26 03/04/2019   IRONPCTSAT 18 01/06/2020   IRONPCTSAT 18 08/04/2019   IRONPCTSAT 7 (L) 03/04/2019    DIAGNOSTIC IMAGING:  I have independently reviewed the scans and discussed with the patient. No results found.   ASSESSMENT:  1. 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 -Hospitalized with severe anemia, weakness and pneumonia and shortness of breath on 09/05/2019 through 09/15/2019. -Myeloma labs from 12/12/2019 shows M spike increased to 0.6 g. Kappa light chains increased to 93 and ratio increased to 10.96. -Darzalex single agent started back on 12/09/2019.  Pomalidomide discontinued after hospitalization.  2. 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 -H. pylori antibody was positive. Dr. Laural Golden is starting her on antibiotics. -Ultrasound of the abdomen on 11/27/2019 showed increased echogenicity of hepatic parenchyma consistent with steatosis. Mild amount of sludge noted in the gallbladder. 10.3 cm bilobed cyst noted in the left kidney.   PLAN:  1. IgG kappa multiple myeloma: -She has been tolerating single agent Darzalex very well.  She is taking  dexamethasone 10 mg weekly. -We reviewed labs from 01/27/2020 which showed M spike increased to 0.5 g from 0.3 g.  Kappa light chains also increased to 140.6. -She is currently receiving Darzalex every 2 weeks.  I have recommended adding Revlimid to the current regimen.  She has tolerated Revlimid in the past very well. -I will start her on Revlimid 10 mg 3 weeks on 1 week off.  She was told to start with her next dose of Darzalex in 2 weeks. -I will reevaluate her in 4 weeks.  2. Weight loss: -Weight has been stable.  Continue Marinol  10 mg twice daily.  3. Myeloma bone disease: -Continue Zometa every 3 months.  Continue calcium and vitamin D.  4. CKD: -Creatinine stable around 1.5.  Continue Lasix as needed.  5. Normocytic anemia: -Received Feraheme on 01/13/2020 on 01/20/2020.  Hemoglobin improved to 12.5.   Orders placed this encounter:  No orders of the defined types were placed in this encounter.    Derek Jack, MD [place] [phone]   I, Milinda Antis, am acting as a scribe for Dr. Sanda Linger.  I, Derek Jack MD, have reviewed the above documentation for accuracy and completeness, and I agree with the above.

## 2020-02-10 NOTE — Patient Instructions (Signed)
[place] at [facility] Discharge Instructions  You were seen today by Dr. Delton Coombes. He went over your recent results. You will be prescribed Revlimid 10 mg, 3 weeks on and 1 week off; start taking the Revlimid on the same day as you get your injection. You received your treatment today. Dr. Delton Coombes will see you back in 4 weeks for labs and follow up.   Thank you for choosing [place] at [facility] to provide your oncology and hematology care.  To afford each patient quality time with our provider, please arrive at least 15 minutes before your scheduled appointment time.   If you have a lab appointment with the [facility] please come in thru the Main Entrance and check in at the main information desk  You need to re-schedule your appointment should you arrive 10 or more minutes late.  We strive to give you quality time with our providers, and arriving late affects you and other patients whose appointments are after yours.  Also, if you no show three or more times for appointments you may be dismissed from the clinic at the providers discretion.     Again, thank you for choosing [facility].  Our hope is that these requests will decrease the amount of time that you wait before being seen by our physicians.       _____________________________________________________________  Should you have questions after your visit to [facility], please contact our office at [phone] between the hours of 8:00 a.m. and 4:30 p.m.  Voicemails left after 4:00 p.m. will not be returned until the following business day.  For prescription refill requests, have your pharmacy contact our office and allow 72 hours.    Cancer Center Support Programs:   > Cancer Support Group  2nd Tuesday of the month 1pm-2pm, Journey Room

## 2020-02-10 NOTE — Patient Instructions (Signed)
[facility] Discharge Instructions for Patients Receiving Chemotherapy   Beginning January 23rd 2017 lab work for the [facility] will be done in the  Main lab at [facility] on 1st floor. If you have a lab appointment with the [facility] please come in thru the  Main Entrance and check in at the main information desk   Today you received the following chemotherapy agents Darzalex injection as well as Vit B12 injection. Follow-up as scheduled  To help prevent nausea and vomiting after your treatment, we encourage you to take your nausea medication   If you develop nausea and vomiting, or diarrhea that is not controlled by your medication, call the clinic.  The clinic phone number is [phone]. Office hours are Monday-Friday 8:30am-5:00pm.  BELOW ARE SYMPTOMS THAT SHOULD BE REPORTED IMMEDIATELY:  *FEVER GREATER THAN 101.0 F  *CHILLS WITH OR WITHOUT FEVER  NAUSEA AND VOMITING THAT IS NOT CONTROLLED WITH YOUR NAUSEA MEDICATION  *UNUSUAL SHORTNESS OF BREATH  *UNUSUAL BRUISING OR BLEEDING  TENDERNESS IN MOUTH AND THROAT WITH OR WITHOUT PRESENCE OF ULCERS  *URINARY PROBLEMS  *BOWEL PROBLEMS  UNUSUAL RASH Items with * indicate a potential emergency and should be followed up as soon as possible. If you have an emergency after office hours please contact your primary care physician or go to the nearest emergency department.  Please call the clinic during office hours if you have any questions or concerns.   You may also contact the Patient Navigator at [phone] should you have any questions or need assistance in obtaining follow up care.      Resources For Cancer Patients and their Caregivers ? American Cancer Society: Can assist with transportation, wigs, general needs, runs Look Good Feel Better.        612 695 2600 ? Cancer Care: Provides financial assistance, online support groups, medication/co-pay assistance.  1-800-813-HOPE  709 242 8307) ? [facility] [place] Assists [place] cancer patients and their families through emotional , educational and financial support.  463-622-3590 ? [place] DSS Where to apply for food stamps, Medicaid and utility assistance. (458) 172-5720 ? [facility]: Transportation to medical appointments. 858-705-1663 ? Social Security Administration: May apply for disability if have a Stage IV cancer. 514-186-9657 (423)[phone] ? [employer], Disability and Transit Services: Assists with nutrition, care and transit needs. [phone]

## 2020-02-10 NOTE — Progress Notes (Signed)
1348 Labs reviewed with and pt seen by Dr. Delton Coombes and pt approved for Darzalex injection today per MD                                 Carol Bradley tolerated Darzalex and Vit B12 injections well without complaints or incident.Hgb 12.5 today so Aranesp held per parameters. VSS Pt discharged self ambulatory in satisfactory condition

## 2020-02-17 ENCOUNTER — Other Ambulatory Visit (HOSPITAL_COMMUNITY): Payer: Medicare Other

## 2020-02-17 ENCOUNTER — Ambulatory Visit (HOSPITAL_COMMUNITY): Payer: Medicare Other

## 2020-02-17 ENCOUNTER — Other Ambulatory Visit (HOSPITAL_COMMUNITY): Payer: Self-pay | Admitting: *Deleted

## 2020-02-17 ENCOUNTER — Ambulatory Visit (HOSPITAL_COMMUNITY): Payer: Medicare Other | Admitting: Hematology

## 2020-02-17 DIAGNOSIS — C9000 Multiple myeloma not having achieved remission: Secondary | ICD-10-CM

## 2020-02-17 MED ORDER — LENALIDOMIDE 10 MG PO CAPS: 10.0000 mg | ORAL_CAPSULE | Freq: Every day | ORAL | 0 refills | Status: DC

## 2020-02-17 NOTE — Telephone Encounter (Signed)
Patient prescription for revlimid sent to pharmacy.  Patient aware to start medication on her next appointment.

## 2020-02-24 ENCOUNTER — Inpatient Hospital Stay (HOSPITAL_COMMUNITY): Payer: Medicare Other

## 2020-02-24 ENCOUNTER — Encounter (HOSPITAL_COMMUNITY): Payer: Self-pay

## 2020-02-24 ENCOUNTER — Other Ambulatory Visit: Payer: Self-pay

## 2020-02-24 VITALS — BP 122/51 | HR 82 | Temp 97.1°F | Resp 18 | Wt 166.0 lb

## 2020-02-24 DIAGNOSIS — D509 Iron deficiency anemia, unspecified: Secondary | ICD-10-CM

## 2020-02-24 DIAGNOSIS — C9000 Multiple myeloma not having achieved remission: Secondary | ICD-10-CM

## 2020-02-24 DIAGNOSIS — Z5112 Encounter for antineoplastic immunotherapy: Secondary | ICD-10-CM | POA: Diagnosis not present

## 2020-02-24 LAB — CBC WITH DIFFERENTIAL/PLATELET
Abs Immature Granulocytes: 0.03 10*3/uL (ref 0.00–0.07)
Basophils Absolute: 0 10*3/uL (ref 0.0–0.1)
Basophils Relative: 0 %
Eosinophils Absolute: 0 10*3/uL (ref 0.0–0.5)
Eosinophils Relative: 1 %
HCT: 37.5 % (ref 36.0–46.0)
Hemoglobin: 12 g/dL (ref 12.0–15.0)
Immature Granulocytes: 0 %
Lymphocytes Relative: 12 %
Lymphs Abs: 1.1 10*3/uL (ref 0.7–4.0)
MCH: 32.1 pg (ref 26.0–34.0)
MCHC: 32 g/dL (ref 30.0–36.0)
MCV: 100.3 fL — ABNORMAL HIGH (ref 80.0–100.0)
Monocytes Absolute: 1 10*3/uL (ref 0.1–1.0)
Monocytes Relative: 11 %
Neutro Abs: 6.4 10*3/uL (ref 1.7–7.7)
Neutrophils Relative %: 76 %
Platelets: 214 10*3/uL (ref 150–400)
RBC: 3.74 MIL/uL — ABNORMAL LOW (ref 3.87–5.11)
RDW: 18.3 % — ABNORMAL HIGH (ref 11.5–15.5)
WBC: 8.5 10*3/uL (ref 4.0–10.5)
nRBC: 0 % (ref 0.0–0.2)

## 2020-02-24 LAB — COMPREHENSIVE METABOLIC PANEL
ALT: 21 U/L (ref 0–44)
AST: 24 U/L (ref 15–41)
Albumin: 3.4 g/dL — ABNORMAL LOW (ref 3.5–5.0)
Alkaline Phosphatase: 99 U/L (ref 38–126)
Anion gap: 11 (ref 5–15)
BUN: 23 mg/dL (ref 8–23)
CO2: 29 mmol/L (ref 22–32)
Calcium: 9.4 mg/dL (ref 8.9–10.3)
Chloride: 104 mmol/L (ref 98–111)
Creatinine, Ser: 1.64 mg/dL — ABNORMAL HIGH (ref 0.44–1.00)
GFR calc Af Amer: 34 mL/min — ABNORMAL LOW (ref >60)
GFR calc non Af Amer: 29 mL/min — ABNORMAL LOW (ref >60)
Glucose, Bld: 119 mg/dL — ABNORMAL HIGH (ref 70–99)
Potassium: 3.7 mmol/L (ref 3.5–5.1)
Sodium: 144 mmol/L (ref 135–145)
Total Bilirubin: 0.6 mg/dL (ref 0.3–1.2)
Total Protein: 6.6 g/dL (ref 6.5–8.1)

## 2020-02-24 LAB — MAGNESIUM: Magnesium: 2.3 mg/dL (ref 1.7–2.4)

## 2020-02-24 LAB — LACTATE DEHYDROGENASE: LDH: 176 U/L (ref 98–192)

## 2020-02-24 MED ORDER — DIPHENHYDRAMINE HCL 25 MG PO CAPS: 50.0000 mg | ORAL_CAPSULE | Freq: Once | ORAL | Status: DC

## 2020-02-24 MED ORDER — DARATUMUMAB-HYALURONIDASE-FIHJ 1800-30000 MG-UT/15ML ~~LOC~~ SOLN
1800.0000 mg | Freq: Once | SUBCUTANEOUS | Status: AC
  Administered 2020-02-24: 1800 mg via SUBCUTANEOUS
  Filled 2020-02-24: qty 15

## 2020-02-24 MED ORDER — ACETAMINOPHEN 325 MG PO TABS: 650.0000 mg | ORAL_TABLET | Freq: Once | ORAL | Status: DC

## 2020-02-24 MED ORDER — DEXAMETHASONE 4 MG PO TABS: 10.0000 mg | ORAL_TABLET | Freq: Once | ORAL | Status: DC

## 2020-02-24 NOTE — Progress Notes (Signed)
[place] reviewed with RLockamy NP and pt approved for Darzalex injection today per NP                                                      Carol Bradley tolerated Darzalex injection well without complaints or incident. VSS Pt discharged self ambulatory in satisfactory condition

## 2020-02-24 NOTE — Progress Notes (Signed)
Pt starting her first dose of Revlimid this afternoon. Pt instructed to call for any questions or problems and verbalized understanding

## 2020-02-24 NOTE — Patient Instructions (Signed)
[facility] Discharge Instructions for Patients Receiving Chemotherapy   Beginning January 23rd 2017 lab work for the [facility] will be done in the  Main lab at [facility] on 1st floor. If you have a lab appointment with the [facility] please come in thru the  Main Entrance and check in at the main information desk   Today you received the following chemotherapy agents Darzalex injection. Follow-up as scheduled  To help prevent nausea and vomiting after your treatment, we encourage you to take your nausea medication   If you develop nausea and vomiting, or diarrhea that is not controlled by your medication, call the clinic.  The clinic phone number is [phone]. Office hours are Monday-Friday 8:30am-5:00pm.  BELOW ARE SYMPTOMS THAT SHOULD BE REPORTED IMMEDIATELY:  *FEVER GREATER THAN 101.0 F  *CHILLS WITH OR WITHOUT FEVER  NAUSEA AND VOMITING THAT IS NOT CONTROLLED WITH YOUR NAUSEA MEDICATION  *UNUSUAL SHORTNESS OF BREATH  *UNUSUAL BRUISING OR BLEEDING  TENDERNESS IN MOUTH AND THROAT WITH OR WITHOUT PRESENCE OF ULCERS  *URINARY PROBLEMS  *BOWEL PROBLEMS  UNUSUAL RASH Items with * indicate a potential emergency and should be followed up as soon as possible. If you have an emergency after office hours please contact your primary care physician or go to the nearest emergency department.  Please call the clinic during office hours if you have any questions or concerns.   You may also contact the Patient Navigator at [phone] should you have any questions or need assistance in obtaining follow up care.      Resources For Cancer Patients and their Caregivers ? American Cancer Society: Can assist with transportation, wigs, general needs, runs Look Good Feel Better.        315-698-7251 ? Cancer Care: Provides financial assistance, online support groups, medication/co-pay assistance.  1-800-813-HOPE (514) 615-3738) ? [place] Assists [place] cancer patients and their families through emotional , educational and financial support.  210-158-4480 ? [place] DSS Where to apply for food stamps, Medicaid and utility assistance. 325-445-2893 ? [facility]: Transportation to medical appointments. 618-288-3243 ? Social Security Administration: May apply for disability if have a Stage IV cancer. ([phone] [phone] ? [employer], Disability and Transit Services: Assists with nutrition, care and transit needs. [phone]

## 2020-02-24 NOTE — Progress Notes (Signed)
Hgb 12 today so Aranesp injection held per parameters

## 2020-02-25 ENCOUNTER — Encounter (HOSPITAL_COMMUNITY): Payer: Self-pay

## 2020-02-25 LAB — PROTEIN ELECTROPHORESIS, SERUM
A/G Ratio: 1.1 (ref 0.7–1.7)
Albumin ELP: 3.1 g/dL (ref 2.9–4.4)
Alpha-1-Globulin: 0.3 g/dL (ref 0.0–0.4)
Alpha-2-Globulin: 0.9 g/dL (ref 0.4–1.0)
Beta Globulin: 0.9 g/dL (ref 0.7–1.3)
Gamma Globulin: 0.8 g/dL (ref 0.4–1.8)
Globulin, Total: 2.9 g/dL (ref 2.2–3.9)
M-Spike, %: 0.6 g/dL — ABNORMAL HIGH
Total Protein ELP: 6 g/dL (ref 6.0–8.5)

## 2020-02-25 LAB — KAPPA/LAMBDA LIGHT CHAINS
Kappa free light chain: 223.8 mg/L — ABNORMAL HIGH (ref 3.3–19.4)
Kappa, lambda light chain ratio: 19.98 — ABNORMAL HIGH (ref 0.26–1.65)
Lambda free light chains: 11.2 mg/L (ref 5.7–26.3)

## 2020-02-25 NOTE — Progress Notes (Signed)
Patient called the clinic today and reported that she took her first dose of Revlimid last night at 9p and at 0200 this morning she woke up with chest fluttering. Patient states her oxygen saturation was in the 80s and her BP was around 160/80 when she checked. Patient states that symptoms have resolved besides feeling "woozy." Dr. Delton Coombes made aware and advises patient not to take tonight's dose of revlimid and to call the clinic back tomorrow with an update on how she is feeling. Patient aware.

## 2020-02-26 ENCOUNTER — Encounter (HOSPITAL_COMMUNITY): Payer: Self-pay

## 2020-02-26 NOTE — Progress Notes (Signed)
Patient called the clinic today to provide an update on how she is feeling. Per Dr. Vickey Huger, patient held Revlimid last night and reports feeling 100% better. Patient instructed to hold Revlimid and restart Monday per Dr. Delton Coombes.

## 2020-02-26 NOTE — Progress Notes (Signed)
Cardiology Office Note   Date:  03/01/2020   ID:  Carol Bradley, Carol Bradley, Carol Bradley, Carol Bradley  PCP:  [name], MD  Cardiologist:  Ruey Storer [place], MD EP: None  Chief Complaint  Patient presents with  . Congestive Heart Failure      History of Present Illness: Carol Bradley is a 79 y.o. female  with PMH of CAD s/p PCI/DES to RCA in 6599, chronic diastolic CHF, HTN, HLD, mitral regurgitation, pulmHTN, COPD, CKD stage 3, and multiple myeloma on maintenance chemotherapy, who presents for follow-up of her CHF  She was  evaluated by cardiology on  07/09/19, at which time she had a recent episode of acute on chronic diastolic CHF with increased lasix for a brief time. She was recommended to continue lasix 35m daily with plans to take an additional lasix for weight gain of 3lbs in 1 day or 5lbs in 1 week. No other medication changes occurred.  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 He hospital course was further complicated by acute on chronic diastolic CHF requiring IV lasix, ultimately discharged home on lasix 262mdaily ,as well as community acquired PNA managed with IV antibiotics. Echo that admission showed EF 55-60%, mild concentric LVH, G1DD, no RWMA, moderate MR, mild-moderate TR, and mild AI. Her last ischemic evaluation was a NST 12/2017 which showed no infarct or ischemia.   About 3 months ago she had a severe HA and felt imbalance. She had an MRI that showed evidence of right MCA CVA. Seen by Dr SeLeonie ManASA dose increased. Referred to Dr AlRayann Hemanor consideration of ILR but she states she couldn't afford the monthly fee for monitoring. She has been started on a new cancer treatment - Revlimid. Noted some palpitations after first dose. Planning to retry this week. Had iron infusion a couple of months ago.     Past Medical History:  Diagnosis Date  . Anemia associated with  stage 3 chronic renal failure 04/13/2016  . CAD (coronary artery disease)   . COPD (chronic obstructive pulmonary disease) ([facility]  . History of tobacco abuse   . Hyperlipidemia   . Hypertension   . Hypogammaglobulinemia ([facility] [place]7/02/2016  . Multiple myeloma ([facility]  . Vitamin B12 deficiency 04/15/2016   Overview:  Vitamin B12 level documented 155, January 2016 with the normal range being 211-924    Past Surgical History:  Procedure Laterality Date  . BACK SURGERY    . CARDIAC CATHETERIZATION  04/2011   right and left cath showing normal right heart pressures,but newly diagnosed coronary artery disease/drug eluting stent placed to RCA with residual disease in the proximal RCA and LAD and ramus, normal LV function and 60-65% EF  . COLONOSCOPY N/A 09/30/2014   Procedure: COLONOSCOPY;  Surgeon: NaRogene HoustonMD;  Location: [facility];  Service: Endoscopy;  Laterality: N/A;  225  . CORONARY ANGIOPLASTY WITH STENT PLACEMENT  04/2011   mid RCA: 3.0 X38 mm Promus DES. Residual 40% disease proximally  . ESOPHAGOGASTRODUODENOSCOPY N/A 09/10/2019   Procedure: ESOPHAGOGASTRODUODENOSCOPY (EGD);  Surgeon: MaClarene EssexMD;  [facility] Service: Endoscopy;  Laterality: N/A;  . NECK SURGERY       Current Outpatient Medications  Medication Sig Dispense Refill  . albuterol (VENTOLIN HFA) 108 (90 Base) MCG/ACT inhaler Inhale 2 puffs into the lungs every 6 (six) hours as needed for  wheezing or shortness of breath. 18 g 2  . aspirin 325 MG tablet Take 325 mg by mouth at bedtime.    Marland Kitchen atorvastatin (LIPITOR) 10 MG tablet TAKE ONE TABLET BY MOUTH DAILY 90 tablet 1  . Calcium Carbonate-Vit D-Min (CALCIUM 600+D PLUS MINERALS) 600-400 MG-UNIT CHEW Chew 1 tablet by mouth 3 (three) times daily.     . cyanocobalamin (,VITAMIN B-12,) 1000 MCG/ML injection Inject 1,000 mcg into the skin every 30 (thirty) days.     . daratumumab-hyaluronidase-fihj (DARZALEX FASPRO) 1800-30000 MG-UT/15ML SOLN Inject 1,800  mg into the skin once.    Marland Kitchen dexamethasone (DECADRON) 2 MG tablet Take 5 tablets once weekly. 30 tablet 3  . diazepam (VALIUM) 5 MG tablet TAKE ONE TABLET BY MOUTH EVERY 6 HOURS AS NEEDED FOR ANXIETY 30 tablet 1  . diltiazem (CARDIZEM CD) 240 MG 24 hr capsule TAKE ONE CAPSULE BY MOUTH EVERY DAY 90 capsule 3  . famotidine (PEPCID) 20 MG tablet Take 20 mg by mouth 2 (two) times daily as needed.     . fluticasone (FLONASE) 50 MCG/ACT nasal spray Place 2 sprays into both nostrils as needed.     . furosemide (LASIX) 20 MG tablet Take 40 mg by mouth daily.     Marland Kitchen ipratropium (ATROVENT) 0.06 % nasal spray SMARTSIG:2 Spray(s) Both Nares Twice Daily PRN    . lenalidomide (REVLIMID) 10 MG capsule Take 1 capsule (10 mg total) by mouth daily. 21 capsule 0  . levofloxacin (LEVAQUIN) 500 MG tablet Take 500 mg by mouth daily.    . magnesium oxide (MAG-OX) 400 (241.3 Mg) MG tablet TAKE ONE TABLET BY MOUTH TWICE DAILY. WHEN TAKING LASIX 60 tablet 2  . metoprolol tartrate (LOPRESSOR) 25 MG tablet TAKE 1 AND 1/2 TABLETS BY MOUTH TWICE DAILY. 75 tablet 3  . neomycin-polymyxin-hydrocortisone (CORTISPORIN) 3.5-10000-1 OTIC suspension SMARTSIG:2 Drop(s) In Ear(s) 4 Times Daily    . ondansetron (ZOFRAN) 8 MG tablet Take 1 tablet (8 mg total) by mouth every 8 (eight) hours as needed for nausea or vomiting. 20 tablet 0  . pantoprazole (PROTONIX) 40 MG tablet Take 1 tablet (40 mg total) by mouth 2 (two) times daily. 60 tablet 0  . polyethylene glycol powder (GLYCOLAX/MIRALAX) 17 GM/SCOOP powder Take by mouth as needed.     . potassium chloride (KLOR-CON) 10 MEQ tablet TAKE ONE TABLET BY MOUTH THREE TIMES DAILY. 90 tablet 4  . tetracycline (SUMYCIN) 500 MG capsule Take 500 mg by mouth 2 (two) times daily.     Marland Kitchen umeclidinium-vilanterol (ANORO ELLIPTA) 62.5-25 MCG/INH AEPB Inhale 1 puff into the lungs daily. 60 each 1   No current facility-administered medications for this visit.    Allergies:   Lisinopril and Plavix  [clopidogrel bisulfate]    Social History:  The patient  reports that she quit smoking about 25 years ago. Her smoking use included cigarettes. She has a 75.00 pack-year smoking history. She has never used smokeless tobacco. She reports that she does not drink alcohol and does not use drugs.   Family History:  The patient's family history includes Cancer in her father; Heart failure in her mother.    ROS:  Please see the history of present illness.   Otherwise, review of systems are positive for none.   All other systems are reviewed and negative.    PHYSICAL EXAM: VS:  BP 120/70   Pulse 96   Ht $R'5\' 7"'QX$  (1.702 m)   Wt 163 lb 6.4 oz (74.1 kg)   SpO2  97%   BMI 25.59 kg/m  , BMI Body mass index is 25.59 kg/m. GEN: Well nourished, well developed, in no acute distress HEENT: sclera anicteric Neck: no JVD, carotid bruits, or masses Cardiac: RRR; no murmurs, rubs, or gallops, trace LE edema  Respiratory:  clear to auscultation bilaterally, normal work of breathing GI: soft, nontender, nondistended, + BS MS: no deformity or atrophy Skin: warm and dry, no rash Neuro:  Strength and sensation are intact Psych: euthymic mood, full affect   EKG:  EKG is not ordered today.   Recent Labs: 09/05/2019: B Natriuretic Peptide 1,489.0 02/24/2020: ALT 21; BUN 23; Creatinine, Ser 1.64; Hemoglobin 12.0; Magnesium 2.3; Platelets 214; Potassium 3.7; Sodium 144    Lipid Panel    Component Value Date/Time   CHOL 99 (L) 12/25/2019 1109   TRIG 95 12/25/2019 1109   HDL 42 12/25/2019 1109   CHOLHDL 2.4 12/25/2019 1109   CHOLHDL 3.0 12/24/2017 1322   VLDL 15 12/24/2017 1322   LDLCALC 39 12/25/2019 1109      Wt Readings from Last 3 Encounters:  03/01/20 163 lb 6.4 oz (74.1 kg)  02/24/20 166 lb (75.3 kg)  02/10/20 164 lb 6.4 oz (74.6 kg)      Other studies Reviewed: Additional studies/ records that were reviewed today include:   Echocardiogram 08/2019: 1. Left ventricular ejection fraction,  by estimation, is 55 to 60%. The  left ventricle has normal function. The left ventricle has no regional  wall motion abnormalities. There is mild concentric left ventricular  hypertrophy. Left ventricular diastolic  parameters are consistent with Grade I diastolic dysfunction (impaired  relaxation).  2. Right ventricular systolic function is normal. The right ventricular  size is mildly enlarged. There is mildly elevated pulmonary artery  systolic pressure.  3. Right atrial size was mildly dilated.  4. The mitral valve is degenerative. Moderate mitral valve regurgitation.  5. Tricuspid valve regurgitation is mild to moderate.  6. The aortic valve is tricuspid. Aortic valve regurgitation is mild.  Mild aortic valve sclerosis is present, with no evidence of aortic valve  stenosis.  7. The inferior vena cava is normal in size with greater than 50%  respiratory variability, suggesting right atrial pressure of 3 mmHg.   Echocardiogram 01/2019: 1. The left ventricle has normal systolic function with an ejection fraction of 60-65%. The cavity size was normal. There is moderately increased left ventricular wall thickness. Left ventricular diastolic Doppler parameters are consistent with impaired relaxation. 2. Left atrial size was mildly dilated. 3. The mitral valve is abnormal. Mild calcification of the mitral valve leaflet. There is mild mitral annular calcification present. Mitral valve regurgitation is mild to moderate by color flow Doppler. 4. The aortic valve is tricuspid. Aortic valve regurgitation is trivial by color flow Doppler. No stenosis of the aortic valve. 5. Normal LV systolic function; mild diastolic dysfunction; moderate LVH; sclerotic aortic valve with trace AI; mild to moderate MR; mild LAE.  NST 12/2017:  Nuclear stress EF: 72%. No wall motion abnormalities  No evidence of significant perfusion defects. No TID (transient ischemic dilatation). No infarct or  ischemia identified. During Lexiscan infusion, diffuse mild, 1 mm ST segment depression noted.  This is a low risk study. There is not appear to be any ischemia identified. No ischemia in the previously placed RCA stent territory.    ASSESSMENT AND PLAN:  1. Chronic diastolic CHF: Overall volume status appears stable. Weight is stable. No edema. - Will continue lasix 59m daily  -  Continue metoprolol - Encouraged weights daily with plans to take additional 90m of lasix if weight gain of 3lbs in 1 day or 5lbs in 1 week.  - Continue to limit salt intake  2. CAD s/p PCI/DES to RCA in 2012: no complaints of chest pain - Continue aspirin, statin, and metoprolol  3. HTN: BP well controlled - Continue metoprolol and diltiazem  4. HLD: LDL 39 - Continue low dose atorvastatin  5. CKD stage 3: Cr 1.64  - Continue to monitor routinely  6. Multiple Myeloma: starting new therapy with Revilmid. Potential for edema, palpitations, increased BP - will need to monitor.  - Continue management per Oncology  7. Anemia: Hgb stable at 12.  No overt bleeding.  - Continue to monitor closely per oncology.   8. GI bleed related to gastric ulcer and AVMs.   9. Cryptogenic CVA. Unable to afford ILR. Will have her wear a 30 day event monitor. I would be reluctant to place on anticoagulation with major GI bleed and AVMs.      Current medicines are reviewed at length with the patient today.  The patient does not have concerns regarding medicines.  The following changes have been made:  As above  Labs/ tests ordered today include:   Orders Placed This Encounter  Procedures  . Cardiac event monitor     Disposition:   FU with Dr. JMartiniquein 4 months  Signed, Gary Gabrielsen JMartinique MD  03/01/2020 12:03 PM

## 2020-03-01 ENCOUNTER — Ambulatory Visit (INDEPENDENT_AMBULATORY_CARE_PROVIDER_SITE_OTHER): Payer: Medicare Other | Admitting: Cardiology

## 2020-03-01 ENCOUNTER — Other Ambulatory Visit: Payer: Self-pay

## 2020-03-01 ENCOUNTER — Encounter: Payer: Self-pay | Admitting: Cardiology

## 2020-03-01 VITALS — BP 120/70 | HR 96 | Ht 67.0 in | Wt 163.4 lb

## 2020-03-01 DIAGNOSIS — I10 Essential (primary) hypertension: Secondary | ICD-10-CM

## 2020-03-01 DIAGNOSIS — I639 Cerebral infarction, unspecified: Secondary | ICD-10-CM | POA: Diagnosis not present

## 2020-03-01 DIAGNOSIS — I5032 Chronic diastolic (congestive) heart failure: Secondary | ICD-10-CM

## 2020-03-01 DIAGNOSIS — I2510 Atherosclerotic heart disease of native coronary artery without angina pectoris: Secondary | ICD-10-CM

## 2020-03-01 NOTE — Patient Instructions (Signed)
Medication Instructions:  Continue same medications *If you need a refill on your cardiac medications before your next appointment, please call your pharmacy*   Lab Work: None ordered   Testing/Procedures: 30 day Event Monitor   Follow-Up: At [facility], you and your health needs are our priority.  As part of our continuing mission to provide you with exceptional heart care, we have created designated Provider Care Teams.  These Care Teams include your primary Cardiologist (physician) and Advanced Practice Providers (APPs -  Physician Assistants and Nurse Practitioners) who all work together to provide you with the care you need, when you need it.  We recommend signing up for the patient portal called "MyChart".  Sign up information is provided on this After Visit Summary.  MyChart is used to connect with patients for Virtual Visits (Telemedicine).  Patients are able to view lab/test results, encounter notes, upcoming appointments, etc.  Non-urgent messages can be sent to your provider as well.   To learn more about what you can do with MyChart, go to [email or web address].    Your next appointment:  Friday 12/3 at 11:20 am   The format for your next appointment: Office    Provider: Dr.Jordan

## 2020-03-06 ENCOUNTER — Ambulatory Visit (INDEPENDENT_AMBULATORY_CARE_PROVIDER_SITE_OTHER): Payer: Medicare Other

## 2020-03-06 DIAGNOSIS — I2510 Atherosclerotic heart disease of native coronary artery without angina pectoris: Secondary | ICD-10-CM | POA: Diagnosis not present

## 2020-03-06 DIAGNOSIS — I5032 Chronic diastolic (congestive) heart failure: Secondary | ICD-10-CM

## 2020-03-06 DIAGNOSIS — I639 Cerebral infarction, unspecified: Secondary | ICD-10-CM

## 2020-03-06 DIAGNOSIS — I10 Essential (primary) hypertension: Secondary | ICD-10-CM | POA: Diagnosis not present

## 2020-03-06 DIAGNOSIS — I4891 Unspecified atrial fibrillation: Secondary | ICD-10-CM

## 2020-03-09 ENCOUNTER — Other Ambulatory Visit: Payer: Self-pay

## 2020-03-09 ENCOUNTER — Inpatient Hospital Stay (HOSPITAL_COMMUNITY): Payer: Medicare Other | Attending: Hematology | Admitting: Hematology

## 2020-03-09 ENCOUNTER — Other Ambulatory Visit (HOSPITAL_COMMUNITY): Payer: Self-pay | Admitting: *Deleted

## 2020-03-09 ENCOUNTER — Inpatient Hospital Stay (HOSPITAL_COMMUNITY): Payer: Medicare Other

## 2020-03-09 VITALS — BP 117/48 | HR 82 | Temp 97.7°F | Resp 16 | Wt 165.4 lb

## 2020-03-09 DIAGNOSIS — D631 Anemia in chronic kidney disease: Secondary | ICD-10-CM | POA: Insufficient documentation

## 2020-03-09 DIAGNOSIS — J449 Chronic obstructive pulmonary disease, unspecified: Secondary | ICD-10-CM | POA: Insufficient documentation

## 2020-03-09 DIAGNOSIS — Z5112 Encounter for antineoplastic immunotherapy: Secondary | ICD-10-CM | POA: Diagnosis not present

## 2020-03-09 DIAGNOSIS — C9000 Multiple myeloma not having achieved remission: Secondary | ICD-10-CM

## 2020-03-09 DIAGNOSIS — E785 Hyperlipidemia, unspecified: Secondary | ICD-10-CM | POA: Diagnosis not present

## 2020-03-09 DIAGNOSIS — Z9221 Personal history of antineoplastic chemotherapy: Secondary | ICD-10-CM | POA: Insufficient documentation

## 2020-03-09 DIAGNOSIS — I2510 Atherosclerotic heart disease of native coronary artery without angina pectoris: Secondary | ICD-10-CM | POA: Diagnosis not present

## 2020-03-09 DIAGNOSIS — Z79899 Other long term (current) drug therapy: Secondary | ICD-10-CM | POA: Insufficient documentation

## 2020-03-09 DIAGNOSIS — N183 Chronic kidney disease, stage 3 unspecified: Secondary | ICD-10-CM | POA: Insufficient documentation

## 2020-03-09 DIAGNOSIS — Z87891 Personal history of nicotine dependence: Secondary | ICD-10-CM | POA: Diagnosis not present

## 2020-03-09 DIAGNOSIS — Z8249 Family history of ischemic heart disease and other diseases of the circulatory system: Secondary | ICD-10-CM | POA: Diagnosis not present

## 2020-03-09 DIAGNOSIS — E538 Deficiency of other specified B group vitamins: Secondary | ICD-10-CM | POA: Diagnosis not present

## 2020-03-09 DIAGNOSIS — Z7982 Long term (current) use of aspirin: Secondary | ICD-10-CM | POA: Diagnosis not present

## 2020-03-09 DIAGNOSIS — Z7952 Long term (current) use of systemic steroids: Secondary | ICD-10-CM | POA: Diagnosis not present

## 2020-03-09 DIAGNOSIS — I10 Essential (primary) hypertension: Secondary | ICD-10-CM | POA: Diagnosis not present

## 2020-03-09 LAB — COMPREHENSIVE METABOLIC PANEL
ALT: 84 U/L — ABNORMAL HIGH (ref 0–44)
AST: 28 U/L (ref 15–41)
Albumin: 3.2 g/dL — ABNORMAL LOW (ref 3.5–5.0)
Alkaline Phosphatase: 139 U/L — ABNORMAL HIGH (ref 38–126)
Anion gap: 12 (ref 5–15)
BUN: 26 mg/dL — ABNORMAL HIGH (ref 8–23)
CO2: 25 mmol/L (ref 22–32)
Calcium: 8.2 mg/dL — ABNORMAL LOW (ref 8.9–10.3)
Chloride: 100 mmol/L (ref 98–111)
Creatinine, Ser: 1.75 mg/dL — ABNORMAL HIGH (ref 0.44–1.00)
GFR calc Af Amer: 32 mL/min — ABNORMAL LOW (ref >60)
GFR calc non Af Amer: 27 mL/min — ABNORMAL LOW (ref >60)
Glucose, Bld: 116 mg/dL — ABNORMAL HIGH (ref 70–99)
Potassium: 3.9 mmol/L (ref 3.5–5.1)
Sodium: 137 mmol/L (ref 135–145)
Total Bilirubin: 0.7 mg/dL (ref 0.3–1.2)
Total Protein: 6.5 g/dL (ref 6.5–8.1)

## 2020-03-09 LAB — CBC WITH DIFFERENTIAL/PLATELET
Abs Immature Granulocytes: 0.03 10*3/uL (ref 0.00–0.07)
Basophils Absolute: 0 10*3/uL (ref 0.0–0.1)
Basophils Relative: 0 %
Eosinophils Absolute: 0.2 10*3/uL (ref 0.0–0.5)
Eosinophils Relative: 2 %
HCT: 37.1 % (ref 36.0–46.0)
Hemoglobin: 11.9 g/dL — ABNORMAL LOW (ref 12.0–15.0)
Immature Granulocytes: 0 %
Lymphocytes Relative: 6 %
Lymphs Abs: 0.6 10*3/uL — ABNORMAL LOW (ref 0.7–4.0)
MCH: 32.2 pg (ref 26.0–34.0)
MCHC: 32.1 g/dL (ref 30.0–36.0)
MCV: 100.3 fL — ABNORMAL HIGH (ref 80.0–100.0)
Monocytes Absolute: 1.2 10*3/uL — ABNORMAL HIGH (ref 0.1–1.0)
Monocytes Relative: 11 %
Neutro Abs: 8.4 10*3/uL — ABNORMAL HIGH (ref 1.7–7.7)
Neutrophils Relative %: 81 %
Platelets: 161 10*3/uL (ref 150–400)
RBC: 3.7 MIL/uL — ABNORMAL LOW (ref 3.87–5.11)
RDW: 16.6 % — ABNORMAL HIGH (ref 11.5–15.5)
WBC: 10.4 10*3/uL (ref 4.0–10.5)
nRBC: 0 % (ref 0.0–0.2)

## 2020-03-09 LAB — MAGNESIUM: Magnesium: 2.6 mg/dL — ABNORMAL HIGH (ref 1.7–2.4)

## 2020-03-09 MED ORDER — DIPHENHYDRAMINE HCL 25 MG PO CAPS: 50.0000 mg | ORAL_CAPSULE | Freq: Once | ORAL | Status: DC

## 2020-03-09 MED ORDER — ACETAMINOPHEN 325 MG PO TABS: 650.0000 mg | ORAL_TABLET | Freq: Once | ORAL | Status: DC

## 2020-03-09 MED ORDER — DEXAMETHASONE 4 MG PO TABS: 10.0000 mg | ORAL_TABLET | Freq: Once | ORAL | Status: DC

## 2020-03-09 MED ORDER — DARATUMUMAB-HYALURONIDASE-FIHJ 1800-30000 MG-UT/15ML ~~LOC~~ SOLN
1800.0000 mg | Freq: Once | SUBCUTANEOUS | Status: AC
  Administered 2020-03-09: 1800 mg via SUBCUTANEOUS
  Filled 2020-03-09: qty 15

## 2020-03-09 NOTE — Progress Notes (Signed)
Patient was assessed by Dr. Delton Coombes and labs have been reviewed.  Creatinine elevated at 1.75, Dr. Delton Coombes wants patient to decrease her lasix to 20 mg daily instead of 40 mg.  Alkaline phosphatase and ALT elevated.  No changes, patient is okay to proceed with treatment today. Primary RN and pharmacy aware.

## 2020-03-09 NOTE — Progress Notes (Signed)
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    03/18/2015 - 07/28/2015 Chemotherapy   Velcade 1.6 mg/m2 discontinued secondary to intolerance   07/28/2015 Adverse Reaction   stool incontinence, weakness, collapse upon standing, felt to be secondary to velcade   11/09/2015 - 12/13/2015 Chemotherapy   cytoxan IV 300 mg/m2 administered X 2 doses only in addition to rev/dex   11/09/2015 - 06/08/2016 Chemotherapy   Revlimid/Dexamethasone    02/05/2017 - 07/09/2017 Chemotherapy   The patient had bortezomib SQ (VELCADE) chemo injection 1.75 mg, 1 mg/m2 = 1.75 mg (66.7 % of original dose 1.5 mg/m2), Subcutaneous,  Once, 1 of 8 cycles Dose modification: 1.5 mg/m2 (original dose 1.5 mg/m2, Cycle 1, Reason: Provider Judgment, Comment: Per Dr. Norma Fredrickson recommendations from [facility] 1 mg/m2 (original dose 1.5 mg/m2, Cycle 1, Reason: Provider Judgment)  for chemotherapy treatment.     07/09/2017 Adverse Reaction   Diarrhea    Chemotherapy   Pomalyst 2 mg (Days 1-21 every 28 days), Ixazomib 2.3 mg (days 1, 8, 15 every 28 days), and Dexamethasone 10 mg (weekly)- Rx's printed on 08/24/2017.  Treatment recommendations from Dr. Norma Fredrickson at [facility].   07/28/2019 -  Chemotherapy   The patient had dexamethasone (DECADRON) tablet 20 mg, 20 mg, Oral, Once, 4 of 10 cycles Dose modification: 10 mg (original dose  10 mg, Cycle 1) Administration: 20 mg (07/28/2019), 10 mg (08/04/2019), 10 mg (08/11/2019), 10 mg (08/25/2019), 10 mg (12/09/2019), 10 mg (12/16/2019), 10 mg (12/23/2019), 10 mg (12/30/2019), 10 mg (01/06/2020), 10 mg (01/13/2020) daratumumab-hyaluronidase-fihj (DARZALEX FASPRO) 1800-30000 MG-UT/15ML chemo SQ injection 1,800 mg, 1,800 mg, Subcutaneous,  Once, 4 of 10 cycles Administration: 1,800 mg (07/28/2019), 1,800 mg (08/04/2019), 1,800 mg (08/11/2019), 1,800 mg (08/25/2019), 1,800 mg (12/09/2019), 1,800 mg (12/16/2019), 1,800 mg (12/23/2019), 1,800 mg (12/30/2019), 1,800 mg (01/06/2020), 1,800 mg (01/13/2020), 1,800 mg (02/10/2020), 1,800 mg (02/24/2020), 1,800 mg (01/20/2020), 1,800 mg (01/27/2020)  for chemotherapy treatment.      CANCER STAGING: Cancer Staging No matching staging information was found for the patient.  INTERVAL HISTORY:  [name], a 79 y.o. female, returns for routine follow-up and consideration for next cycle of chemotherapy. Alexandr was last seen on 02/10/2020.  Due for cycle #5 of Darzalex Faspro today.   Overall, she tells me she has been feeling pretty well. She started taking Revlimid on 8/23 and is tolerating it well. Her appetite is good and she denies having N/V/D. She denies having hematochezia or hematuria. She is taking Lasix daily.  Overall, she feels ready for next cycle of chemo today.    REVIEW OF SYSTEMS:  Review of Systems  Constitutional: Positive for appetite change (mildly decreased) and fatigue (severe).  Gastrointestinal: Negative for blood in stool, diarrhea, nausea and vomiting.  Genitourinary: Negative for hematuria.   All other systems reviewed and are negative.   PAST MEDICAL/SURGICAL HISTORY:  Past Medical History:  Diagnosis Date  .  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 CAD (coronary artery disease)   . COPD (chronic obstructive pulmonary disease) ([place])   . History of tobacco abuse   . Hyperlipidemia   . Hypertension   .  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 Multiple myeloma ([place])   . Vitamin B12 deficiency 04/15/2016   Overview:  Vitamin B12 level documented 155, January 2016 with the normal range being 211-924   Past Surgical History:  Procedure Laterality Date  . BACK SURGERY    . CARDIAC CATHETERIZATION  04/2011   right and left cath showing normal right heart pressures,but newly diagnosed coronary artery disease/drug eluting stent placed to RCA with residual disease in the proximal RCA and LAD and ramus, normal LV function and 60-65% EF  . COLONOSCOPY N/A 09/30/2014   Procedure: COLONOSCOPY;  Surgeon: Rogene Houston, MD;  [facility];  Service: Endoscopy;  Laterality: N/A;  225  . CORONARY ANGIOPLASTY WITH STENT PLACEMENT  04/2011   mid RCA: 3.0 X38 mm Promus DES. Residual 40% disease proximally  . ESOPHAGOGASTRODUODENOSCOPY N/A 09/10/2019   Procedure: ESOPHAGOGASTRODUODENOSCOPY (EGD);  Surgeon: Clarene Essex, MD;  Location: [place];  Service: Endoscopy;  Laterality: N/A;  . NECK SURGERY      SOCIAL HISTORY:  Social History   Socioeconomic History  . Marital status: Widowed    Spouse name: Not on file  . Number of children: Not on file  . Years of education: Not on file  . Highest education level: Not on file  Occupational History  . 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 Smoking status: Former Smoker    Packs/day: 3.00    Years: 25.00    Pack years: 75.00    Types: Cigarettes    Quit date: 07/10/1994    Years since quitting: 25.6  . Smokeless tobacco: Never Used  Vaping Use  . Vaping Use: Never used  Substance and Sexual Activity  . Alcohol use: No  . Drug use: Never  . Sexual activity: Not on file  Other Topics Concern  . Not on file  Social History Narrative  . Not on file   Social Determinants of Health   Financial Resource Strain:   . Difficulty of Paying Living Expenses: Not on file  Food Insecurity: No Food Insecurity  .  Worried About Charity fundraiser in the Last Year: Never true  . Ran Out of Food in the Last Year: Never true  Transportation Needs: No Transportation Needs  . Lack of Transportation (Medical): No  . Lack of Transportation (Non-Medical): No  Physical Activity:   . Days of Exercise per Week: Not on file  . Minutes of Exercise per Session: Not on file  Stress:   . Feeling of Stress : Not on file  Social Connections:   . Frequency of Communication with Friends and Family: Not on file  . Frequency of Social Gatherings with Friends and Family: Not on file  . Attends Religious Services: Not on file  . Active Member of Clubs or Organizations: Not on file  . Attends Archivist Meetings: Not on file  . Marital Status: Not on file  Intimate Partner Violence:   . Fear of Current or Ex-Partner: Not on file  . Emotionally Abused: Not on file  . Physically Abused: Not on file  . Sexually Abused: Not on file    FAMILY HISTORY:  Family History  Problem Relation Age  of Onset  . Heart failure Mother   . Cancer Father     CURRENT MEDICATIONS:  Current Outpatient Medications  Medication Sig Dispense Refill  . albuterol (VENTOLIN HFA) 108 (90 Base) MCG/ACT inhaler Inhale 2 puffs into the lungs every 6 (six) hours as needed for wheezing or shortness of breath. 18 g 2  . aspirin 325 MG tablet Take 325 mg by mouth at bedtime.    Marland Kitchen atorvastatin (LIPITOR) 10 MG tablet TAKE ONE TABLET BY MOUTH DAILY 90 tablet 1  . Calcium Carbonate-Vit D-Min (CALCIUM 600+D PLUS MINERALS) 600-400 MG-UNIT CHEW Chew 1 tablet by mouth 3 (three) times daily.     . cyanocobalamin (,VITAMIN B-12,) 1000 MCG/ML injection Inject 1,000 mcg into the skin every 30 (thirty) days.     . daratumumab-hyaluronidase-fihj (DARZALEX FASPRO) 1800-30000 MG-UT/15ML SOLN Inject 1,800 mg into the skin once.    Marland Kitchen dexamethasone (DECADRON) 2 MG tablet Take 5 tablets once weekly. 30 tablet 3  . diazepam (VALIUM) 5 MG tablet TAKE ONE  TABLET BY MOUTH EVERY 6 HOURS AS NEEDED FOR ANXIETY 30 tablet 1  . diltiazem (CARDIZEM CD) 240 MG 24 hr capsule TAKE ONE CAPSULE BY MOUTH EVERY DAY 90 capsule 3  . famotidine (PEPCID) 20 MG tablet Take 20 mg by mouth 2 (two) times daily as needed.     . fluticasone (FLONASE) 50 MCG/ACT nasal spray Place 2 sprays into both nostrils as needed.     . furosemide (LASIX) 20 MG tablet Take 40 mg by mouth daily.     . furosemide (LASIX) 40 MG tablet Take 40 mg by mouth daily as needed.    Marland Kitchen ipratropium (ATROVENT) 0.06 % nasal spray SMARTSIG:2 Spray(s) Both Nares Twice Daily PRN    . lenalidomide (REVLIMID) 10 MG capsule Take 1 capsule (10 mg total) by mouth daily. 21 capsule 0  . levofloxacin (LEVAQUIN) 500 MG tablet Take 500 mg by mouth daily.    . magnesium oxide (MAG-OX) 400 MG tablet Take 1 tablet by mouth 2 (two) times daily.    . metoprolol tartrate (LOPRESSOR) 25 MG tablet TAKE 1 AND 1/2 TABLETS BY MOUTH TWICE DAILY. 75 tablet 3  . neomycin-polymyxin-hydrocortisone (CORTISPORIN) 3.5-10000-1 OTIC suspension SMARTSIG:2 Drop(s) In Ear(s) 4 Times Daily    . pantoprazole (PROTONIX) 40 MG tablet Take 1 tablet (40 mg total) by mouth 2 (two) times daily. 60 tablet 0  . polyethylene glycol powder (GLYCOLAX/MIRALAX) 17 GM/SCOOP powder Take by mouth as needed.     . potassium chloride (KLOR-CON) 10 MEQ tablet TAKE ONE TABLET BY MOUTH THREE TIMES DAILY. 90 tablet 4  . tetracycline (SUMYCIN) 500 MG capsule Take 500 mg by mouth 2 (two) times daily.     Marland Kitchen umeclidinium-vilanterol (ANORO ELLIPTA) 62.5-25 MCG/INH AEPB Inhale 1 puff into the lungs daily. 60 each 1  . ondansetron (ZOFRAN) 8 MG tablet Take 1 tablet (8 mg total) by mouth every 8 (eight) hours as needed for nausea or vomiting. (Patient not taking: Reported on 03/09/2020) 20 tablet 0   No current facility-administered medications for this visit.    ALLERGIES:  Allergies  Allergen Reactions  . Lisinopril Cough  . Plavix [Clopidogrel Bisulfate]  Swelling    PHYSICAL EXAM:  Performance status (ECOG): 1 - Symptomatic but completely ambulatory  Vitals:   03/09/20 1252  BP: (!) 117/48  Pulse: 82  Resp: 16  Temp: 97.7 F (36.5 C)  SpO2: 99%   Wt Readings from Last 3 Encounters:  03/09/20 165 lb 6.4 oz (  75 kg)  03/01/20 163 lb 6.4 oz (74.1 kg)  02/24/20 166 lb (75.3 kg)   Physical Exam Vitals reviewed.  Constitutional:      Appearance: Normal appearance.  Cardiovascular:     Rate and Rhythm: Normal rate and regular rhythm.     Heart sounds: Normal heart sounds.  Pulmonary:     Effort: Pulmonary effort is normal.     Breath sounds: Normal breath sounds.  Abdominal:     General: There is no distension.     Palpations: Abdomen is soft.  Musculoskeletal:     Right lower leg: No edema.     Left lower leg: No edema.  Neurological:     General: No focal deficit present.     Mental Status: She is alert and oriented to person, place, and time.  Psychiatric:        Mood and Affect: Mood normal.        Behavior: Behavior normal.     LABORATORY DATA:  I have reviewed the labs as listed.  CBC Latest Ref Rng & Units 03/09/2020 02/24/2020 02/10/2020  WBC 4.0 - 10.5 K/uL 10.4 8.5 10.5  Hemoglobin 12.0 - 15.0 g/dL 11.9(L) 12.0 12.5  Hematocrit 36 - 46 % 37.1 37.5 40.2  Platelets 150 - 400 K/uL 161 214 214   CMP Latest Ref Rng & Units 03/09/2020 02/24/2020 02/10/2020  Glucose 70 - 99 mg/dL 116(H) 119(H) 112(H)  BUN 8 - 23 mg/dL 26(H) 23 35(H)  Creatinine 0.44 - 1.00 mg/dL 1.75(H) 1.64(H) 1.55(H)  Sodium 135 - 145 mmol/L 137 144 144  Potassium 3.5 - 5.1 mmol/L 3.9 3.7 3.5  Chloride 98 - 111 mmol/L 100 104 106  CO2 22 - 32 mmol/L _0 Calcium 8.9 - 10.3 mg/dL 8.2(L) 9.4 8.9  Total Protein 6.5 - 8.1 g/dL 6.5 6.6 6.3(L)  Total Bilirubin 0.3 - 1.2 mg/dL 0.7 0.6 0.7  Alkaline Phos 38 - 126 U/L 139(H) 99 78  AST 15 - 41 U/L _1 ALT 0 - 44 U/L 84(H) 21 21    DIAGNOSTIC IMAGING:  I have independently reviewed the  scans and discussed with the patient. No results found.   ASSESSMENT:  1. 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 -Hospitalized with severe anemia, weakness and pneumonia and shortness of breath on 09/05/2019 through 09/15/2019. -Myeloma labs from 12/12/2019 shows M spike increased to 0.6 g. Kappa light chains increased to 93 and ratio increased to 10.96. -Darzalex single agent started back on 12/09/2019.  Pomalidomide discontinued after hospitalization. -Revlimid 10 mg 3 weeks on 1 week off started on 03/01/2020.  2. 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 -H. pylori antibody was positive. Dr. Laural Golden is starting her on antibiotics. -Ultrasound of the abdomen on 11/27/2019 showed increased echogenicity of hepatic parenchyma consistent with steatosis. Mild amount of sludge noted in the gallbladder. 10.3 cm bilobed cyst noted in the left kidney.   PLAN:  1. IgG kappa multiple myeloma: -She is tolerating Darzalex every 2 weeks very well. -After the first tablet of Revlimid she had high blood pressure and elevated heart rate.  However  she restarted taking Revlimid 10 mg 3 weeks on 1 week off on 03/01/2020 and has tolerated very well in the last 1 week. -Myeloma labs on 02/24/2020 shows M spike 0.6 g.  Kappa light chains increased to 23 with ratio of  19.98. -She will continue Revlimid.  I reviewed her labs today which showed normal CBC. -ALT is mildly elevated at 84.  She will continue current treatment.  She will take dexamethasone 20 mg weekly.  We will see her back in 4 weeks for follow-up.  2. Weight loss: -Weight slightly improved.  Continue Marinol 10 mg twice daily.  3. Myeloma bone disease: -Continue Zometa every 3 months.  Continue calcium and vitamin D.  4. CKD: -Creatinine increased to 1.75.  We will cut back on Lasix to 20  mg daily.  5. Normocytic anemia: -Last Feraheme on 01/20/2020.  Hemoglobin today is 11.9.   Orders placed this encounter:  No orders of the defined types were placed in this encounter.    Derek Jack, MD [place] [phone]   I, Milinda Antis, am acting as a scribe for Dr. Sanda Linger.  I, Derek Jack MD, have reviewed the above documentation for accuracy and completeness, and I agree with the above.

## 2020-03-09 NOTE — Patient Instructions (Signed)
[facility] Discharge Instructions for Patients Receiving Chemotherapy   Beginning January 23rd 2017 lab work for the Cancer Center will be done in the  Main lab at [facility] on 1st floor. If you have a lab appointment with the Cancer Center please come in thru the  Main Entrance and check in at the main information desk   Today you received the following chemotherapy agents Daratumumab  To help prevent nausea and vomiting after your treatment, we encourage you to take your nausea medication   If you develop nausea and vomiting, or diarrhea that is not controlled by your medication, call the clinic.  The clinic phone number is [phone]. Office hours are Monday-Friday 8:30am-5:00pm.  BELOW ARE SYMPTOMS THAT SHOULD BE REPORTED IMMEDIATELY:  *FEVER GREATER THAN 101.0 F  *CHILLS WITH OR WITHOUT FEVER  NAUSEA AND VOMITING THAT IS NOT CONTROLLED WITH YOUR NAUSEA MEDICATION  *UNUSUAL SHORTNESS OF BREATH  *UNUSUAL BRUISING OR BLEEDING  TENDERNESS IN MOUTH AND THROAT WITH OR WITHOUT PRESENCE OF ULCERS  *URINARY PROBLEMS  *BOWEL PROBLEMS  UNUSUAL RASH Items with * indicate a potential emergency and should be followed up as soon as possible. If you have an emergency after office hours please contact your primary care physician or go to the nearest emergency department.  Please call the clinic during office hours if you have any questions or concerns.   You may also contact the Patient Navigator at [phone] should you have any questions or need assistance in obtaining follow up care.      Resources For Cancer Patients and their Caregivers ? American Cancer Society: Can assist with transportation, wigs, general needs, runs Look Good Feel Better.        1-888-227-6333 ? Cancer Care: Provides financial assistance, online support groups, medication/co-pay assistance.  1-800-813-HOPE (4673) ? [facility] Assists [place]  cancer patients and their families through emotional , educational and financial support.  336-427-4357 ? [place] DSS Where to apply for food stamps, Medicaid and utility assistance. 336-342-1394 ? [facility]: Transportation to medical appointments. 336-347-2287 ? Social Security Administration: May apply for disability if have a Stage IV cancer. 336-342-7796 1-800-772-1213 ? [place] Aging, Disability and Transit Services: Assists with nutrition, care and transit needs. 336-349-2343          

## 2020-03-09 NOTE — Progress Notes (Signed)
Carol Bradley presents today for D1C5 Daratumumab injection. Pt denies any new changes or symptoms since last treatment. Lab results and vitals have been reviewed, including Cr. 1.75 and ALT 84. Per MD labs are stable and within parameters for treatment. Patient has been assessed by Dr. Delton Coombes who has approved proceeding with treatment today as planned.  Injection tolerated without incident or complaint. Discharged in satisfactory condition with follow up instructions.

## 2020-03-09 NOTE — Patient Instructions (Signed)
[place] at [facility] Discharge Instructions  You were seen today by Dr. Delton Coombes. He went over your recent results. You received your treatment today; your next treatment will be in 2 weeks. Continue taking prednisone 5 tablets once a week and Benadryl with Tylenol only on the day of your chemo treatment. Dr. Delton Coombes will see you back in 4 weeks for labs and follow up.   Thank you for choosing [place] at [facility] to provide your oncology and hematology care.  To afford each patient quality time with our provider, please arrive at least 15 minutes before your scheduled appointment time.   If you have a lab appointment with the [facility] please come in thru the Main Entrance and check in at the main information desk  You need to re-schedule your appointment should you arrive 10 or more minutes late.  We strive to give you quality time with our providers, and arriving late affects you and other patients whose appointments are after yours.  Also, if you no show three or more times for appointments you may be dismissed from the clinic at the providers discretion.     Again, thank you for choosing [facility].  Our hope is that these requests will decrease the amount of time that you wait before being seen by our physicians.       _____________________________________________________________  Should you have questions after your visit to [facility], please contact our office at [phone] between the hours of 8:00 a.m. and 4:30 p.m.  Voicemails left after 4:00 p.m. will not be returned until the following business day.  For prescription refill requests, have your pharmacy contact our office and allow 72 hours.    Cancer Center Support Programs:   > Cancer Support Group  2nd Tuesday of the month 1pm-2pm, Journey Room

## 2020-03-12 ENCOUNTER — Other Ambulatory Visit (HOSPITAL_COMMUNITY): Payer: Self-pay | Admitting: Hematology

## 2020-03-12 DIAGNOSIS — C9000 Multiple myeloma not having achieved remission: Secondary | ICD-10-CM

## 2020-03-12 NOTE — Telephone Encounter (Signed)
Chart reviewed. Revlimid refilled per Dr. Tomie China last office note.

## 2020-03-18 ENCOUNTER — Other Ambulatory Visit: Payer: Self-pay | Admitting: *Deleted

## 2020-03-18 NOTE — Patient Outreach (Signed)
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 THN RN CM left HIPAA [facility] Portability and Accountability Act) compliant voicemail message along with CMs contact info.   Plan: [facility] RN CM scheduled this [facility] engaged patient for another call attempt within 7-10 business days  [name] [name], RN, BSN, [place] Coordinator Office number [phone] Main [facility] number [phone] Fax number [phone]

## 2020-03-23 ENCOUNTER — Other Ambulatory Visit: Payer: Self-pay

## 2020-03-23 ENCOUNTER — Inpatient Hospital Stay (HOSPITAL_COMMUNITY): Payer: Medicare Other

## 2020-03-23 ENCOUNTER — Inpatient Hospital Stay (HOSPITAL_COMMUNITY): Payer: Medicare Other | Attending: Hematology

## 2020-03-23 ENCOUNTER — Encounter (HOSPITAL_COMMUNITY): Payer: Self-pay

## 2020-03-23 VITALS — BP 115/48 | HR 79 | Temp 96.9°F | Resp 19 | Wt 163.8 lb

## 2020-03-23 DIAGNOSIS — R519 Headache, unspecified: Secondary | ICD-10-CM | POA: Diagnosis not present

## 2020-03-23 DIAGNOSIS — K259 Gastric ulcer, unspecified as acute or chronic, without hemorrhage or perforation: Secondary | ICD-10-CM | POA: Diagnosis not present

## 2020-03-23 DIAGNOSIS — Z7982 Long term (current) use of aspirin: Secondary | ICD-10-CM | POA: Diagnosis not present

## 2020-03-23 DIAGNOSIS — I129 Hypertensive chronic kidney disease with stage 1 through stage 4 chronic kidney disease, or unspecified chronic kidney disease: Secondary | ICD-10-CM | POA: Insufficient documentation

## 2020-03-23 DIAGNOSIS — E538 Deficiency of other specified B group vitamins: Secondary | ICD-10-CM | POA: Diagnosis not present

## 2020-03-23 DIAGNOSIS — C9000 Multiple myeloma not having achieved remission: Secondary | ICD-10-CM

## 2020-03-23 DIAGNOSIS — R634 Abnormal weight loss: Secondary | ICD-10-CM | POA: Insufficient documentation

## 2020-03-23 DIAGNOSIS — Q2733 Arteriovenous malformation of digestive system vessel: Secondary | ICD-10-CM | POA: Insufficient documentation

## 2020-03-23 DIAGNOSIS — E785 Hyperlipidemia, unspecified: Secondary | ICD-10-CM | POA: Diagnosis not present

## 2020-03-23 DIAGNOSIS — N189 Chronic kidney disease, unspecified: Secondary | ICD-10-CM | POA: Insufficient documentation

## 2020-03-23 DIAGNOSIS — I2510 Atherosclerotic heart disease of native coronary artery without angina pectoris: Secondary | ICD-10-CM | POA: Diagnosis not present

## 2020-03-23 DIAGNOSIS — Z9221 Personal history of antineoplastic chemotherapy: Secondary | ICD-10-CM | POA: Diagnosis not present

## 2020-03-23 DIAGNOSIS — D649 Anemia, unspecified: Secondary | ICD-10-CM | POA: Insufficient documentation

## 2020-03-23 DIAGNOSIS — D638 Anemia in other chronic diseases classified elsewhere: Secondary | ICD-10-CM

## 2020-03-23 DIAGNOSIS — H6691 Otitis media, unspecified, right ear: Secondary | ICD-10-CM | POA: Diagnosis not present

## 2020-03-23 DIAGNOSIS — J449 Chronic obstructive pulmonary disease, unspecified: Secondary | ICD-10-CM | POA: Insufficient documentation

## 2020-03-23 DIAGNOSIS — Z79899 Other long term (current) drug therapy: Secondary | ICD-10-CM | POA: Insufficient documentation

## 2020-03-23 DIAGNOSIS — Z5112 Encounter for antineoplastic immunotherapy: Secondary | ICD-10-CM | POA: Diagnosis not present

## 2020-03-23 DIAGNOSIS — Z7952 Long term (current) use of systemic steroids: Secondary | ICD-10-CM | POA: Diagnosis not present

## 2020-03-23 DIAGNOSIS — R5383 Other fatigue: Secondary | ICD-10-CM | POA: Insufficient documentation

## 2020-03-23 DIAGNOSIS — R42 Dizziness and giddiness: Secondary | ICD-10-CM | POA: Diagnosis not present

## 2020-03-23 DIAGNOSIS — R0602 Shortness of breath: Secondary | ICD-10-CM | POA: Insufficient documentation

## 2020-03-23 DIAGNOSIS — Z87891 Personal history of nicotine dependence: Secondary | ICD-10-CM | POA: Diagnosis not present

## 2020-03-23 DIAGNOSIS — D509 Iron deficiency anemia, unspecified: Secondary | ICD-10-CM

## 2020-03-23 LAB — CBC WITH DIFFERENTIAL/PLATELET
Abs Immature Granulocytes: 0.04 10*3/uL (ref 0.00–0.07)
Basophils Absolute: 0 10*3/uL (ref 0.0–0.1)
Basophils Relative: 0 %
Eosinophils Absolute: 0.3 10*3/uL (ref 0.0–0.5)
Eosinophils Relative: 6 %
HCT: 35.7 % — ABNORMAL LOW (ref 36.0–46.0)
Hemoglobin: 11.2 g/dL — ABNORMAL LOW (ref 12.0–15.0)
Immature Granulocytes: 1 %
Lymphocytes Relative: 16 %
Lymphs Abs: 1 10*3/uL (ref 0.7–4.0)
MCH: 31.8 pg (ref 26.0–34.0)
MCHC: 31.4 g/dL (ref 30.0–36.0)
MCV: 101.4 fL — ABNORMAL HIGH (ref 80.0–100.0)
Monocytes Absolute: 1.1 10*3/uL — ABNORMAL HIGH (ref 0.1–1.0)
Monocytes Relative: 18 %
Neutro Abs: 3.5 10*3/uL (ref 1.7–7.7)
Neutrophils Relative %: 59 %
Platelets: 208 10*3/uL (ref 150–400)
RBC: 3.52 MIL/uL — ABNORMAL LOW (ref 3.87–5.11)
RDW: 16.1 % — ABNORMAL HIGH (ref 11.5–15.5)
WBC: 5.9 10*3/uL (ref 4.0–10.5)
nRBC: 0 % (ref 0.0–0.2)

## 2020-03-23 LAB — COMPREHENSIVE METABOLIC PANEL
ALT: 19 U/L (ref 0–44)
AST: 20 U/L (ref 15–41)
Albumin: 2.7 g/dL — ABNORMAL LOW (ref 3.5–5.0)
Alkaline Phosphatase: 85 U/L (ref 38–126)
Anion gap: 10 (ref 5–15)
BUN: 26 mg/dL — ABNORMAL HIGH (ref 8–23)
CO2: 24 mmol/L (ref 22–32)
Calcium: 8 mg/dL — ABNORMAL LOW (ref 8.9–10.3)
Chloride: 105 mmol/L (ref 98–111)
Creatinine, Ser: 1.68 mg/dL — ABNORMAL HIGH (ref 0.44–1.00)
GFR calc Af Amer: 33 mL/min — ABNORMAL LOW (ref >60)
GFR calc non Af Amer: 29 mL/min — ABNORMAL LOW (ref >60)
Glucose, Bld: 133 mg/dL — ABNORMAL HIGH (ref 70–99)
Potassium: 3.8 mmol/L (ref 3.5–5.1)
Sodium: 139 mmol/L (ref 135–145)
Total Bilirubin: 0.6 mg/dL (ref 0.3–1.2)
Total Protein: 6 g/dL — ABNORMAL LOW (ref 6.5–8.1)

## 2020-03-23 MED ORDER — DEXAMETHASONE 4 MG PO TABS
10.0000 mg | ORAL_TABLET | Freq: Once | ORAL | Status: AC
  Administered 2020-03-23: 10 mg via ORAL

## 2020-03-23 MED ORDER — ZOLEDRONIC ACID 4 MG/5ML IV CONC
3.0000 mg | Freq: Once | INTRAVENOUS | Status: AC
  Administered 2020-03-23: 3 mg via INTRAVENOUS
  Filled 2020-03-23: qty 3.75

## 2020-03-23 MED ORDER — CYANOCOBALAMIN 1000 MCG/ML IJ SOLN
INTRAMUSCULAR | Status: AC
  Filled 2020-03-23: qty 1

## 2020-03-23 MED ORDER — ACETAMINOPHEN 325 MG PO TABS: 650.0000 mg | ORAL_TABLET | Freq: Once | ORAL | Status: DC

## 2020-03-23 MED ORDER — SODIUM CHLORIDE 0.9 % IV SOLN: Freq: Once | INTRAVENOUS | Status: AC

## 2020-03-23 MED ORDER — CYANOCOBALAMIN 1000 MCG/ML IJ SOLN
1000.0000 ug | Freq: Once | INTRAMUSCULAR | Status: AC
  Administered 2020-03-23: 1000 ug via INTRAMUSCULAR

## 2020-03-23 MED ORDER — DIPHENHYDRAMINE HCL 25 MG PO CAPS: 50.0000 mg | ORAL_CAPSULE | Freq: Once | ORAL | Status: DC

## 2020-03-23 MED ORDER — DEXAMETHASONE 4 MG PO TABS
ORAL_TABLET | ORAL | Status: AC
  Filled 2020-03-23: qty 3

## 2020-03-23 MED ORDER — DARATUMUMAB-HYALURONIDASE-FIHJ 1800-30000 MG-UT/15ML ~~LOC~~ SOLN
1800.0000 mg | Freq: Once | SUBCUTANEOUS | Status: AC
  Administered 2020-03-23: 1800 mg via SUBCUTANEOUS
  Filled 2020-03-23: qty 15

## 2020-03-23 NOTE — Progress Notes (Signed)
Patient presents today for treatment. Labs reviewed by Dr. Delton Coombes. Message received to proceed with treatment. Creatinine today 1.68. Corrected Calcium 9.0. Patient took her own  Tylenol and Benadryl prior to appointment but did not take her Decadron . Per MD give Decadron today and patient will only have to wait 30 minutes prior to administration.   Treatment given today per MD orders. Tolerated  without adverse affects. Vital signs stable. No complaints at this time. Discharged from clinic ambulatory. F/U with [facility] as scheduled.

## 2020-03-23 NOTE — Patient Instructions (Signed)
[facility] Discharge Instructions for Patients Receiving Chemotherapy  Today you received the following chemotherapy agents   To help prevent nausea and vomiting after your treatment, we encourage you to take your nausea medication   If you develop nausea and vomiting that is not controlled by your nausea medication, call the clinic.   BELOW ARE SYMPTOMS THAT SHOULD BE REPORTED IMMEDIATELY:  *FEVER GREATER THAN 100.5 F  *CHILLS WITH OR WITHOUT FEVER  NAUSEA AND VOMITING THAT IS NOT CONTROLLED WITH YOUR NAUSEA MEDICATION  *UNUSUAL SHORTNESS OF BREATH  *UNUSUAL BRUISING OR BLEEDING  TENDERNESS IN MOUTH AND THROAT WITH OR WITHOUT PRESENCE OF ULCERS  *URINARY PROBLEMS  *BOWEL PROBLEMS  UNUSUAL RASH Items with * indicate a potential emergency and should be followed up as soon as possible.  Feel free to call the clinic should you have any questions or concerns. The clinic phone number is [phone].  Please show the CHEMO ALERT CARD at check-in to the Emergency Department and triage nurse.   

## 2020-03-24 ENCOUNTER — Other Ambulatory Visit: Payer: Self-pay | Admitting: *Deleted

## 2020-03-24 NOTE — Patient Outreach (Signed)
[facility]) Care Management  03/24/2020  [place] Sianez Capers 1941-02-03 [id number]   THN unsuccessful outreach to [facility] engaged patient for complex care   On APL Mrs Gartin was referred to [facility] for EMMI general discharge on 09/18/19 after her discharge from 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 Patient is able to verify HIPAA ([place] and Accountability Act) identifiers Reviewed and addressed the purpose of the follow up call with the patient Mrs Varnell asked if [facility] RN CM "could call me back the first of the week on next week" [facility] RN CM agreed  Consent: [facility] RN CM reviewed [facility] services with patient. Patient gave verbal consent for services.  Plan: [facility] RN CM scheduled this [facility]N engaged patient for another call attempt within 7-10 business days  Fenix Ruppe [name], RN, BSN, [place] Coordinator Office number [phone] Main [facility] number [phone] Fax number [phone]

## 2020-03-26 ENCOUNTER — Other Ambulatory Visit: Payer: Self-pay | Admitting: Cardiology

## 2020-03-29 ENCOUNTER — Other Ambulatory Visit: Payer: Self-pay | Admitting: *Deleted

## 2020-03-29 ENCOUNTER — Ambulatory Visit: Payer: Medicare Other | Admitting: Neurology

## 2020-03-29 ENCOUNTER — Other Ambulatory Visit: Payer: Self-pay

## 2020-03-29 NOTE — Patient Outreach (Addendum)
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 Patient gave verbal consent for services.  Follow up  Carol Bradley confirms she is doing very well She reports she is managing her medical issues at home well  She reports she has been busy lately She is preparing for visitors at the time of this outreach [facility] RN CM discussed the process of completing EMMI and complex care services that includes care coordination and disease management She denies need of assistance for care coordination and disease management  [facility] RN CM discussed EMMI/Complex care case closure with her as she is doing well and she agreed  She did agree to take [facility] RN CM office number and outreach prn in the future   Plan [facility] case closure as agreed per patient Letters to patient and MD  [facility] CM Care Plan Problem One     Most Recent Value  Care Plan Problem One knowledge deficit for major medical conditions CHF, HTn  Role Documenting the Problem One Care Management Telephonic Coordinator  Care Plan for Problem One Active  [facility] Long Term Goal  Over the next 90 days, patient will not be hospitalized for complications related to chronic illnesses  [facility] Long Term Goal Start Date 09/19/19  [facility] Long Term Goal Met Date 12/17/19  Interventions for Problem  One Long Term Goal case closure  [facility] CM Short Term Goal #1  over the next 31 days patient will be able to verbalize with outreach interventions to manage CHF at home  [facility] CM Short Term Goal #1 Start Date 09/19/19  [facility] CM Short Term Goal #1 Met Date 03/29/20    [facility] CM Care Plan Problem Two     Most Recent Value  Care Plan Problem Two knowledge deficit at home for cva, cancer, chf  Role Documenting the Problem Two Care Management Telephonic Coordinator  Care Plan for Problem Two Active  Interventions for Problem Two Long Term Goal  case closure  [facility] Long Term Goal over the next 90 days patient will be able to verbalize with outreach interventions to manage chf, cva, cancer  [facility] Long Term Goal Start Date 12/17/19  [facility] Long Term Goal Met Date 03/29/20  [facility] CM Short Term Goal #1  Patient will be able to verbalize in the next 14 days  3 signs & symptoms to report to MD for increasing problems with cancer, chf , cva  [facility] CM Short Term Goal #1 Start Date 12/17/19  [facility] CM Short Term Goal #1 Met Date  03/29/20  Interventions for Short Term Goal #2  case closure      Ermal Haberer [name], RN, BSN, [place] Coordinator Office number [phone] Main [facility] number [phone] Fax number [phone]

## 2020-03-31 ENCOUNTER — Ambulatory Visit (INDEPENDENT_AMBULATORY_CARE_PROVIDER_SITE_OTHER): Payer: Medicare Other | Admitting: Neurology

## 2020-03-31 ENCOUNTER — Encounter: Payer: Self-pay | Admitting: Neurology

## 2020-03-31 ENCOUNTER — Other Ambulatory Visit: Payer: Self-pay

## 2020-03-31 VITALS — BP 113/55 | HR 76 | Ht 67.0 in | Wt 167.0 lb

## 2020-03-31 DIAGNOSIS — I6990 Unspecified sequelae of unspecified cerebrovascular disease: Secondary | ICD-10-CM

## 2020-03-31 DIAGNOSIS — I639 Cerebral infarction, unspecified: Secondary | ICD-10-CM

## 2020-03-31 NOTE — Progress Notes (Signed)
[facility] [address] [place]. [place] [address] [phone]       OFFICE FOLLOW UP VISIT NOTE  [name] Date of Birth:  [birth year] Medical Record Number:  [id number]   Referring MD:  Derek Jack  Reason for Referral: Abnormal MRI HPI: Initial visit 12/25/2019 :[name] is a pleasant 79 year old Caucasian lady seen today for initial office consultation visit.  History is obtained from the patient and review of electronic medical records and I personally reviewed imaging films in PACS.  Patient was recently seen by Dr. 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  Patient denies any history of stroke in the form of weakness, numbness, and gait imbalance, slurred speech or vision loss.  She has no prior history of significant head injury with loss of consciousness, seizures, migraines or any significant neurological problems.  She denies history of atrial fibrillation palpitations syncope or cardiac issues.  On inquiry she admits to having some mild balance issues for the last couple of months she feels wobbly but she has had no falls.  She is anxious but walking slowly and carefully has done all right.  She is independent in activities of daily living.  She lives alone.  Her brother lives nearby and family visits her on the weekends.  She denies any memory difficulties.  She had an MRI scan done on 12/12/2019 which I personally reviewed shows late subacute to early chronic infarct in the right posterior MCA territory which was not noted on the previous MRI scan scan from September 2020.  She had 2D echo also done which showed normal ejection fraction 55 to 60%.  No intravascular extra cranial vascular imaging was done. She has no past history of stroke or family history of stroke.  Vascular risk factors include  only hypertension hyperlipidemia and age.  She was previously on aspirin 81 mg daily and since the MRI report this has been increased to 325 mg daily which is tolerating well without upset stomach, bruising or bleeding.   Update 03/31/2020: She returns for follow-up after last visit 3 months ago.  Patient states she is doing well she has had no stroke or TIA symptoms since last visit.  She is currently wearing an external heart monitor till September 26.  She had CT angiogram of the brain and neck done on 01/04/2020 which I personally reviewed and shows no significant large vessel extracranial intracranial stenosis.  Lab work on 12/25/2019 showed LDL cholesterol to be optimal at 39 mg percent and hemoglobin A1c at 5.9.  The patient has some recurrent right ear pain which she has had off-and-on and her primary care physician has tried 2 separate courses of antibiotics without success.  She is even been seen by ENT but no specific cause has been found.  She very rarely has occasional transient sharp shooting pain in the right frontal region was last few seconds but is not bothersome.  She remains on aspirin which is tolerating well without side effects.  Her blood pressures well controlled today it is 116/55.  She has no new complaints today.  ROS:   14 system review of systems is positive for balance issues.,  gait imbalance and all other systems negative  PMH:  Past Medical History:  Diagnosis Date  . 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  CAD (coronary artery disease)   . COPD (chronic obstructive pulmonary disease) ([place])   . History of tobacco abuse   . Hyperlipidemia   . Hypertension   . 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 Multiple myeloma ([place])   . Vitamin B12 deficiency 04/15/2016   Overview:  Vitamin B12 level documented 155, January 2016 with the normal range being 211-924    Social History:  Social History   Socioeconomic History  . Marital status: Widowed     Spouse name: Not on file  . Number of children: Not on file  . Years of education: Not on file  . Highest education level: Not on file  Occupational History  . 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 Smoking status: Former Smoker    Packs/day: 3.00    Years: 25.00    Pack years: 75.00    Types: Cigarettes    Quit date: 07/10/1994    Years since quitting: 25.7  . Smokeless tobacco: Never Used  Vaping Use  . Vaping Use: Never used  Substance and Sexual Activity  . Alcohol use: No  . Drug use: Never  . Sexual activity: Not on file  Other Topics Concern  . Not on file  Social History Narrative  . Not on file   Social Determinants of Health   Financial Resource Strain:   . Difficulty of Paying Living Expenses: Not on file  Food Insecurity: No Food Insecurity  . Worried About Charity fundraiser in the Last Year: Never true  . Ran Out of Food in the Last Year: Never true  Transportation Needs: No Transportation Needs  . Lack of Transportation (Medical): No  . Lack of Transportation (Non-Medical): No  Physical Activity:   . Days of Exercise per Week: Not on file  . Minutes of Exercise per Session: Not on file  Stress:   . Feeling of Stress : Not on file  Social Connections:   . Frequency of Communication with Friends and Family: Not on file  . Frequency of Social Gatherings with Friends and Family: Not on file  . Attends Religious Services: Not on file  . Active Member of Clubs or Organizations: Not on file  . Attends Archivist Meetings: Not on file  . Marital Status: Not on file  Intimate Partner Violence:   . Fear of Current or Ex-Partner: Not on file  . Emotionally Abused: Not on file  . Physically Abused: Not on file  . Sexually Abused: Not on file    Medications:   Current Outpatient Medications on File Prior to Visit  Medication Sig Dispense Refill  . albuterol (VENTOLIN HFA) 108 (90 Base) MCG/ACT inhaler Inhale 2  puffs into the lungs every 6 (six) hours as needed for wheezing or shortness of breath. 18 g 2  . aspirin 325 MG tablet Take 325 mg by mouth at bedtime.    Marland Kitchen atorvastatin (LIPITOR) 10 MG tablet TAKE ONE TABLET BY MOUTH DAILY 90 tablet 1  . Calcium Carbonate-Vit D-Min (CALCIUM 600+D PLUS MINERALS) 600-400 MG-UNIT CHEW Chew 1 tablet by mouth 3 (three) times daily.     . cyanocobalamin (,VITAMIN B-12,) 1000 MCG/ML injection Inject 1,000 mcg into the skin every 30 (thirty) days.     . daratumumab-hyaluronidase-fihj (DARZALEX FASPRO) 1800-30000 MG-UT/15ML SOLN Inject 1,800 mg into the skin once.    Marland Kitchen dexamethasone (DECADRON) 2 MG tablet Take 5 tablets once weekly. 30 tablet 3  . diazepam (VALIUM) 5  MG tablet TAKE ONE TABLET BY MOUTH EVERY 6 HOURS AS NEEDED FOR ANXIETY 30 tablet 1  . diltiazem (CARDIZEM CD) 240 MG 24 hr capsule TAKE ONE CAPSULE BY MOUTH EVERY DAY 90 capsule 3  . famotidine (PEPCID) 20 MG tablet Take 20 mg by mouth 2 (two) times daily as needed.     . fluticasone (FLONASE) 50 MCG/ACT nasal spray Place 2 sprays into both nostrils as needed.     . furosemide (LASIX) 40 MG tablet Take 40 mg by mouth daily as needed.    Marland Kitchen ipratropium (ATROVENT) 0.06 % nasal spray SMARTSIG:2 Spray(s) Both Nares Twice Daily PRN    . magnesium oxide (MAG-OX) 400 MG tablet Take 1 tablet by mouth 2 (two) times daily.    . metoprolol tartrate (LOPRESSOR) 25 MG tablet TAKE 1 AND 1/2 TABLETS BY MOUTH TWICE DAILY 75 tablet 3  . ondansetron (ZOFRAN) 8 MG tablet Take 1 tablet (8 mg total) by mouth every 8 (eight) hours as needed for nausea or vomiting. 20 tablet 0  . pantoprazole (PROTONIX) 40 MG tablet Take 1 tablet (40 mg total) by mouth 2 (two) times daily. 60 tablet 0  . polyethylene glycol powder (GLYCOLAX/MIRALAX) 17 GM/SCOOP powder Take by mouth as needed.     . potassium chloride (KLOR-CON) 10 MEQ tablet TAKE ONE TABLET BY MOUTH THREE TIMES DAILY. 90 tablet 4  . REVLIMID 10 MG capsule Take 1 capsule (10 mg  total) by mouth daily as directed. 21 capsule 10  . tetracycline (SUMYCIN) 500 MG capsule Take 500 mg by mouth 2 (two) times daily.     Marland Kitchen umeclidinium-vilanterol (ANORO ELLIPTA) 62.5-25 MCG/INH AEPB Inhale 1 puff into the lungs daily. 60 each 1   No current facility-administered medications on file prior to visit.    Allergies:   Allergies  Allergen Reactions  . Lisinopril Cough  . Plavix [Clopidogrel Bisulfate] Swelling    Physical Exam General: Pleasant elderly Caucasian lady, seated, in no evident distress Head: head normocephalic and atraumatic.   Neck: supple with no carotid or supraclavicular bruits Cardiovascular: regular rate and rhythm, no murmurs Musculoskeletal: Mild kyphosis Skin:  no rash/petichiae Vascular:  Normal pulses all extremities  Neurologic Exam Mental Status: Awake and fully alert. Oriented to place and time. Recent and remote memory intact. Attention span, concentration and fund of knowledge appropriate. Mood and affect appropriate.  Diminished recall 1/3.  Able to name 12 animals which can walk on 4 legs Cranial Nerves: Fundoscopic exam reveals sharp disc margins. Pupils equal, briskly reactive to light. Extraocular movements full without nystagmus. Visual fields full to confrontation. Hearing diminished bilaterally facial sensation intact. Face, tongue, palate moves normally and symmetrically.  Motor: Normal bulk and tone. Normal strength in all tested extremity muscles. Sensory.: intact to touch , pinprick , position and vibratory sensation.  Coordination: Rapid alternating movements normal in all extremities. Finger-to-nose and heel-to-shin performed accurately bilaterally. Gait and Station: Arises from chair without difficulty. Stance is normal. Gait demonstrates normal stride length and balance . Able to heel, toe and tandem walk with moderatet difficulty.  Reflexes: 1+ and symmetric. Toes downgoing.   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  No clinical symptoms of stroke.  Vascular risk factors of hypertension and hyperlipidemia and age     PLAN: I had a long discussion with the patient regarding results of her  CT angiograms and lab work and answered questions. Recommend we follow the results of the 30-day heart monitor and she continue aspirin for stroke prevention and maintain aggressive risk factor modification with strict control of hypertension with blood pressure goal below 130/90, lipids with LDL cholesterol goal below 70 mg percent and diabetes with hemoglobin A1c goal below 6.5%. She will return for follow-up in the future in 6 months or call earlier if necessary.  Greater than 50% time during this 35-minute  visit were spent on counseling and coordination of care about her silent right MCA infarct and abnormal MRI and answering questions.   Antony Contras, MD  [facility] [address] [place], [place] [address]  Phone [phone] Fax [phone] Note: This document was prepared with digital dictation and possible smart phrase technology. Any transcriptional errors that result from this process are unintentional.

## 2020-03-31 NOTE — Patient Instructions (Signed)
I had a long discussion with the patient regarding results of her CT angiograms and lab work and answered questions. Recommend we follow the results of the 30-day heart monitor and she continue aspirin for stroke prevention and maintain aggressive risk factor modification with strict control of hypertension with blood pressure goal below 130/90, lipids with LDL cholesterol goal below 70 mg percent and diabetes with hemoglobin A1c goal below 6.5%. She will return for follow-up in the future in 6 months or call earlier if necessary.

## 2020-04-06 ENCOUNTER — Encounter (HOSPITAL_COMMUNITY): Payer: Self-pay | Admitting: Hematology

## 2020-04-06 ENCOUNTER — Other Ambulatory Visit: Payer: Self-pay

## 2020-04-06 ENCOUNTER — Inpatient Hospital Stay (HOSPITAL_COMMUNITY): Payer: Medicare Other

## 2020-04-06 ENCOUNTER — Inpatient Hospital Stay (HOSPITAL_BASED_OUTPATIENT_CLINIC_OR_DEPARTMENT_OTHER): Payer: Medicare Other | Admitting: Hematology

## 2020-04-06 VITALS — BP 138/47 | HR 86 | Temp 96.7°F | Resp 18 | Wt 163.0 lb

## 2020-04-06 DIAGNOSIS — D509 Iron deficiency anemia, unspecified: Secondary | ICD-10-CM | POA: Diagnosis not present

## 2020-04-06 DIAGNOSIS — C9000 Multiple myeloma not having achieved remission: Secondary | ICD-10-CM | POA: Diagnosis not present

## 2020-04-06 DIAGNOSIS — Z5112 Encounter for antineoplastic immunotherapy: Secondary | ICD-10-CM | POA: Diagnosis not present

## 2020-04-06 LAB — CBC WITH DIFFERENTIAL/PLATELET
Abs Immature Granulocytes: 0.06 10*3/uL (ref 0.00–0.07)
Basophils Absolute: 0 10*3/uL (ref 0.0–0.1)
Basophils Relative: 0 %
Eosinophils Absolute: 0.4 10*3/uL (ref 0.0–0.5)
Eosinophils Relative: 4 %
HCT: 33.9 % — ABNORMAL LOW (ref 36.0–46.0)
Hemoglobin: 10.5 g/dL — ABNORMAL LOW (ref 12.0–15.0)
Immature Granulocytes: 1 %
Lymphocytes Relative: 6 %
Lymphs Abs: 0.6 10*3/uL — ABNORMAL LOW (ref 0.7–4.0)
MCH: 31.5 pg (ref 26.0–34.0)
MCHC: 31 g/dL (ref 30.0–36.0)
MCV: 101.8 fL — ABNORMAL HIGH (ref 80.0–100.0)
Monocytes Absolute: 0.6 10*3/uL (ref 0.1–1.0)
Monocytes Relative: 6 %
Neutro Abs: 8.3 10*3/uL — ABNORMAL HIGH (ref 1.7–7.7)
Neutrophils Relative %: 83 %
Platelets: 176 10*3/uL (ref 150–400)
RBC: 3.33 MIL/uL — ABNORMAL LOW (ref 3.87–5.11)
RDW: 16.1 % — ABNORMAL HIGH (ref 11.5–15.5)
WBC: 9.9 10*3/uL (ref 4.0–10.5)
nRBC: 0 % (ref 0.0–0.2)

## 2020-04-06 LAB — COMPREHENSIVE METABOLIC PANEL
ALT: 14 U/L (ref 0–44)
AST: 18 U/L (ref 15–41)
Albumin: 2.7 g/dL — ABNORMAL LOW (ref 3.5–5.0)
Alkaline Phosphatase: 66 U/L (ref 38–126)
Anion gap: 11 (ref 5–15)
BUN: 27 mg/dL — ABNORMAL HIGH (ref 8–23)
CO2: 23 mmol/L (ref 22–32)
Calcium: 8.6 mg/dL — ABNORMAL LOW (ref 8.9–10.3)
Chloride: 107 mmol/L (ref 98–111)
Creatinine, Ser: 1.54 mg/dL — ABNORMAL HIGH (ref 0.44–1.00)
GFR calc Af Amer: 37 mL/min — ABNORMAL LOW (ref >60)
GFR calc non Af Amer: 32 mL/min — ABNORMAL LOW (ref >60)
Glucose, Bld: 149 mg/dL — ABNORMAL HIGH (ref 70–99)
Potassium: 3.8 mmol/L (ref 3.5–5.1)
Sodium: 141 mmol/L (ref 135–145)
Total Bilirubin: 0.8 mg/dL (ref 0.3–1.2)
Total Protein: 6 g/dL — ABNORMAL LOW (ref 6.5–8.1)

## 2020-04-06 LAB — LACTATE DEHYDROGENASE: LDH: 145 U/L (ref 98–192)

## 2020-04-06 MED ORDER — DARATUMUMAB-HYALURONIDASE-FIHJ 1800-30000 MG-UT/15ML ~~LOC~~ SOLN
1800.0000 mg | Freq: Once | SUBCUTANEOUS | Status: AC
  Administered 2020-04-06: 1800 mg via SUBCUTANEOUS
  Filled 2020-04-06: qty 15

## 2020-04-06 NOTE — Patient Instructions (Signed)
[facility] at [facility] Discharge Instructions  You were seen today by Dr. Katragadda. He went over your recent results. You received your treatment today; continue receiving your treatment every 2 weeks. Dr. Katragadda will see you back in 4 weeks for labs and follow up.   Thank you for choosing [facility] at [facility] to provide your oncology and hematology care.  To afford each patient quality time with our provider, please arrive at least 15 minutes before your scheduled appointment time.   If you have a lab appointment with the Cancer Center please come in thru the Main Entrance and check in at the main information desk  You need to re-schedule your appointment should you arrive 10 or more minutes late.  We strive to give you quality time with our providers, and arriving late affects you and other patients whose appointments are after yours.  Also, if you no show three or more times for appointments you may be dismissed from the clinic at the providers discretion.     Again, thank you for choosing [facility].  Our hope is that these requests will decrease the amount of time that you wait before being seen by our physicians.       _____________________________________________________________  Should you have questions after your visit to [facility], please contact our office at [phone] between the hours of 8:00 a.m. and 4:30 p.m.  Voicemails left after 4:00 p.m. will not be returned until the following business day.  For prescription refill requests, have your pharmacy contact our office and allow 72 hours.    Cancer Center Support Programs:   > Cancer Support Group  2nd Tuesday of the month 1pm-2pm, Journey Room    

## 2020-04-06 NOTE — Progress Notes (Signed)
Patient was assessed by Dr. Delton Coombes and labs have been reviewed.  Creatinine 1.54, patient is okay to proceed with treatment today. Primary RN and pharmacy aware.

## 2020-04-06 NOTE — Progress Notes (Signed)
[place], [place] [address]   CLINIC:  Medical Oncology/Hematology  PCP:  Carol Chroman, MD [address] 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    03/18/2015 - 07/28/2015 Chemotherapy   Velcade 1.6 mg/m2 discontinued secondary to intolerance   07/28/2015 Adverse Reaction   stool incontinence, weakness, collapse upon standing, felt to be secondary to velcade   11/09/2015 - 12/13/2015 Chemotherapy   cytoxan IV 300 mg/m2 administered X 2 doses only in addition to rev/dex   11/09/2015 - 06/08/2016 Chemotherapy   Revlimid/Dexamethasone    02/05/2017 - 07/09/2017 Chemotherapy   The patient had bortezomib SQ (VELCADE) chemo injection 1.75 mg, 1 mg/m2 = 1.75 mg (66.7 % of original dose 1.5 mg/m2), Subcutaneous,  Once, 1 of 8 cycles Dose modification: 1.5 mg/m2 (original dose 1.5 mg/m2, Cycle 1, Reason: Provider Judgment, Comment: Per Dr. Norma Bradley recommendations from [facility] 1 mg/m2 (original dose 1.5 mg/m2, Cycle 1, Reason: Provider Judgment)  for chemotherapy treatment.     07/09/2017 Adverse Reaction   Diarrhea    Chemotherapy   Pomalyst 2 mg (Days 1-21 every 28 days), Ixazomib 2.3 mg (days 1, 8, 15 every 28 days), and Dexamethasone 10 mg (weekly)- Rx's printed on 08/24/2017.  Treatment recommendations from Dr. Norma Bradley at [facility].   07/28/2019 -  Chemotherapy   The patient had dexamethasone (DECADRON) tablet 20 mg, 20 mg, Oral, Once, 5 of 10 cycles Dose modification: 10 mg  (original dose 10 mg, Cycle 1) Administration: 20 mg (07/28/2019), 10 mg (08/04/2019), 10 mg (08/11/2019), 10 mg (08/25/2019), 10 mg (12/09/2019), 10 mg (12/16/2019), 10 mg (12/23/2019), 10 mg (12/30/2019), 10 mg (01/06/2020), 10 mg (01/13/2020), 10 mg (03/23/2020) daratumumab-hyaluronidase-fihj (DARZALEX FASPRO) 1800-30000 MG-UT/15ML chemo SQ injection 1,800 mg, 1,800 mg, Subcutaneous,  Once, 5 of 10 cycles Administration: 1,800 mg (07/28/2019), 1,800 mg (08/04/2019), 1,800 mg (08/11/2019), 1,800 mg (08/25/2019), 1,800 mg (12/09/2019), 1,800 mg (12/16/2019), 1,800 mg (12/23/2019), 1,800 mg (12/30/2019), 1,800 mg (01/06/2020), 1,800 mg (01/13/2020), 1,800 mg (02/10/2020), 1,800 mg (02/24/2020), 1,800 mg (01/20/2020), 1,800 mg (01/27/2020), 1,800 mg (03/09/2020), 1,800 mg (03/23/2020)  for chemotherapy treatment.      CANCER STAGING: Cancer Staging No matching staging information was found for the patient.  INTERVAL HISTORY:  Carol Bradley, a 79 y.o. female, returns for routine follow-up and consideration for next cycle of chemotherapy. [name] was last seen on 03/09/2020.  Due for cycle #6 of Darzalex Faspro today.   Overall, she tells me she has been feeling pretty well. She is tolerating the previous treatment well and denies any falls, though she currently has a right ear infection and finished her round of antibiotics on 9/26, though she continues having discomfort in her right ear. She denies having any drainage or difficulty hearing. She is tolerating the Revlimid well and denies palpitations or mouth sores. Her appetite is great and she is no longer taking Carol Bradley.  She will make an appointment with Dr. Benjamine Bradley for her ear problem.  Overall, she feels ready for next cycle of chemo  today.    REVIEW OF SYSTEMS:  Review of Systems  Constitutional: Positive for fatigue (50%). Negative for appetite change.  HENT:   Negative for hearing loss.        7/10 R ear pain; no discharge  Respiratory: Positive for shortness of  breath.   Cardiovascular: Negative for palpitations.  Neurological: Positive for dizziness and headaches.  All other systems reviewed and are negative.   PAST MEDICAL/SURGICAL HISTORY:  Past Medical History:  Diagnosis Date  . 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 CAD (coronary artery disease)   . COPD (chronic obstructive pulmonary disease) ([place])   . History of tobacco abuse   . Hyperlipidemia   . Hypertension   . 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 Multiple myeloma ([place])   . Vitamin B12 deficiency 04/15/2016   Overview:  Vitamin B12 level documented 155, January 2016 with the normal range being 211-924   Past Surgical History:  Procedure Laterality Date  . BACK SURGERY    . CARDIAC CATHETERIZATION  04/2011   right and left cath showing normal right heart pressures,but newly diagnosed coronary artery disease/drug eluting stent placed to RCA with residual disease in the proximal RCA and LAD and ramus, normal LV function and 60-65% EF  . COLONOSCOPY N/A 09/30/2014   Procedure: COLONOSCOPY;  Surgeon: Carol Houston, MD;  [facility];  Service: Endoscopy;  Laterality: N/A;  225  . CORONARY ANGIOPLASTY WITH STENT PLACEMENT  04/2011   mid RCA: 3.0 X38 mm Promus DES. Residual 40% disease proximally  . ESOPHAGOGASTRODUODENOSCOPY N/A 09/10/2019   Procedure: ESOPHAGOGASTRODUODENOSCOPY (EGD);  Surgeon: Carol Essex, MD;  Location: [facility];  Service: Endoscopy;  Laterality: N/A;  . NECK SURGERY      SOCIAL HISTORY:  Social History   Socioeconomic History  . Marital status: Widowed    Spouse name: Not on file  . Number of children: Not on file  . Years of education: Not on file  . Highest education level: Not on file  Occupational History  . 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 Smoking status: Former Smoker    Packs/day: 3.00    Years: 25.00    Pack years: 75.00    Types: Cigarettes    Quit date:  07/10/1994    Years since quitting: 25.7  . Smokeless tobacco: Never Used  Vaping Use  . Vaping Use: Never used  Substance and Sexual Activity  . Alcohol use: No  . Drug use: Never  . Sexual activity: Not on file  Other Topics Concern  . Not on file  Social History Narrative  . Not on file   Social Determinants of Health   Financial Resource Strain:   . Difficulty of Paying Living Expenses: Not on file  Food Insecurity: No Food Insecurity  . Worried About Charity fundraiser in the Last Year: Never true  . Ran Out of Food in the Last Year: Never true  Transportation Needs: No Transportation Needs  . Lack of Transportation (Medical): No  . Lack of Transportation (Non-Medical): No  Physical Activity:   . Days of Exercise per Week: Not on file  . Minutes of Exercise per Session: Not on file  Stress:   . Feeling of Stress : Not on file  Social Connections:   . Frequency of Communication with Friends and Family: Not on file  . Frequency of Social Gatherings with Friends and Family: Not on file  .  Attends Religious Services: Not on file  . Active Member of Clubs or Organizations: Not on file  . Attends Archivist Meetings: Not on file  . Marital Status: Not on file  Intimate Partner Violence:   . Fear of Current or Ex-Partner: Not on file  . Emotionally Abused: Not on file  . Physically Abused: Not on file  . Sexually Abused: Not on file    FAMILY HISTORY:  Family History  Problem Relation Age of Onset  . Heart failure Mother   . Cancer Father     CURRENT MEDICATIONS:  Current Outpatient Medications  Medication Sig Dispense Refill  . albuterol (VENTOLIN HFA) 108 (90 Base) MCG/ACT inhaler Inhale 2 puffs into the lungs every 6 (six) hours as needed for wheezing or shortness of breath. 18 g 2  . aspirin 325 MG tablet Take 325 mg by mouth at bedtime.    Marland Kitchen atorvastatin (LIPITOR) 10 MG tablet TAKE ONE TABLET BY MOUTH DAILY 90 tablet 1  . Calcium Carbonate-Vit D-Min  (CALCIUM 600+D PLUS MINERALS) 600-400 MG-UNIT CHEW Chew 1 tablet by mouth 3 (three) times daily.     . cyanocobalamin (,VITAMIN B-12,) 1000 MCG/ML injection Inject 1,000 mcg into the skin every 30 (thirty) days.     . daratumumab-hyaluronidase-fihj (DARZALEX FASPRO) 1800-30000 MG-UT/15ML SOLN Inject 1,800 mg into the skin once.    Marland Kitchen dexamethasone (DECADRON) 2 MG tablet Take 5 tablets once weekly. 30 tablet 3  . diazepam (VALIUM) 5 MG tablet TAKE ONE TABLET BY MOUTH EVERY 6 HOURS AS NEEDED FOR ANXIETY 30 tablet 1  . diltiazem (CARDIZEM CD) 240 MG 24 hr capsule TAKE ONE CAPSULE BY MOUTH EVERY DAY 90 capsule 3  . famotidine (PEPCID) 20 MG tablet Take 20 mg by mouth 2 (two) times daily as needed.     . fluticasone (FLONASE) 50 MCG/ACT nasal spray Place 2 sprays into both nostrils as needed.     . furosemide (LASIX) 40 MG tablet Take 40 mg by mouth daily as needed.    Marland Kitchen ipratropium (ATROVENT) 0.06 % nasal spray SMARTSIG:2 Spray(s) Both Nares Twice Daily PRN    . magnesium oxide (MAG-OX) 400 MG tablet Take 1 tablet by mouth 2 (two) times daily.    . metoprolol tartrate (LOPRESSOR) 25 MG tablet TAKE 1 AND 1/2 TABLETS BY MOUTH TWICE DAILY 75 tablet 3  . ondansetron (ZOFRAN) 8 MG tablet Take 1 tablet (8 mg total) by mouth every 8 (eight) hours as needed for nausea or vomiting. 20 tablet 0  . pantoprazole (PROTONIX) 40 MG tablet Take 1 tablet (40 mg total) by mouth 2 (two) times daily. 60 tablet 0  . polyethylene glycol powder (GLYCOLAX/MIRALAX) 17 GM/SCOOP powder Take by mouth as needed.     . potassium chloride (KLOR-CON) 10 MEQ tablet TAKE ONE TABLET BY MOUTH THREE TIMES DAILY. 90 tablet 4  . REVLIMID 10 MG capsule Take 1 capsule (10 mg total) by mouth daily as directed. 21 capsule 10  . tetracycline (SUMYCIN) 500 MG capsule Take 500 mg by mouth 2 (two) times daily.     Marland Kitchen umeclidinium-vilanterol (ANORO ELLIPTA) 62.5-25 MCG/INH AEPB Inhale 1 puff into the lungs daily. 60 each 1   No current  facility-administered medications for this visit.    ALLERGIES:  Allergies  Allergen Reactions  . Lisinopril Cough  . Plavix [Clopidogrel Bisulfate] Swelling    PHYSICAL EXAM:  Performance status (ECOG): 1 - Symptomatic but completely ambulatory  Vitals:   04/06/20 1241  BP: Marland Kitchen)  138/47  Pulse: 86  Resp: 18  Temp: (!) 96.7 F (35.9 C)  SpO2: 97%   Wt Readings from Last 3 Encounters:  04/06/20 163 lb (73.9 kg)  03/31/20 167 lb (75.8 kg)  03/23/20 163 lb 12.8 oz (74.3 kg)   Physical Exam Vitals reviewed.  Constitutional:      Appearance: Normal appearance.  HENT:     Right Ear: No drainage, swelling or tenderness.  Cardiovascular:     Rate and Rhythm: Normal rate and regular rhythm.     Pulses: Normal pulses.     Heart sounds: Normal heart sounds.  Pulmonary:     Effort: Pulmonary effort is normal.     Breath sounds: Normal breath sounds.  Musculoskeletal:     Right lower leg: No edema.     Left lower leg: No edema.  Neurological:     General: No focal deficit present.     Mental Status: She is alert and oriented to person, place, and time.  Psychiatric:        Mood and Affect: Mood normal.        Behavior: Behavior normal.     LABORATORY DATA:  I have reviewed the labs as listed.  CBC Latest Ref Rng & Units 04/06/2020 03/23/2020 03/09/2020  WBC 4.0 - 10.5 K/uL 9.9 5.9 10.4  Hemoglobin 12.0 - 15.0 g/dL 10.5(L) 11.2(L) 11.9(L)  Hematocrit 36 - 46 % 33.9(L) 35.7(L) 37.1  Platelets 150 - 400 K/uL 176 208 161   CMP Latest Ref Rng & Units 04/06/2020 03/23/2020 03/09/2020  Glucose 70 - 99 mg/dL 149(H) 133(H) 116(H)  BUN 8 - 23 mg/dL 27(H) 26(H) 26(H)  Creatinine 0.44 - 1.00 mg/dL 1.54(H) 1.68(H) 1.75(H)  Sodium 135 - 145 mmol/L 141 139 137  Potassium 3.5 - 5.1 mmol/L 3.8 3.8 3.9  Chloride 98 - 111 mmol/L 107 105 100  CO2 22 - 32 mmol/L $RemoveB'23 24 25  'NMDtgTpA$ Calcium 8.9 - 10.3 mg/dL 8.6(L) 8.0(L) 8.2(L)  Total Protein 6.5 - 8.1 g/dL 6.0(L) 6.0(L) 6.5  Total Bilirubin 0.3 -  1.2 mg/dL 0.8 0.6 0.7  Alkaline Phos 38 - 126 U/L 66 85 139(H)  AST 15 - 41 U/L $Remo'18 20 28  'ZhMpK$ ALT 0 - 44 U/L 14 19 84(H)   Lab Results  Component Value Date   LDH 145 04/06/2020   LDH 176 02/24/2020   LDH 195 (H) 01/27/2020   Lab Results  Component Value Date   TOTALPROTELP 6.0 02/24/2020   ALBUMINELP 3.1 02/24/2020   A1GS 0.3 02/24/2020   A2GS 0.9 02/24/2020   BETS 0.9 02/24/2020   GAMS 0.8 02/24/2020   MSPIKE 0.6 (H) 02/24/2020   SPEI Comment 02/24/2020    Lab Results  Component Value Date   KPAFRELGTCHN 223.8 (H) 02/24/2020   LAMBDASER 11.2 02/24/2020   KAPLAMBRATIO 19.98 (H) 02/24/2020    DIAGNOSTIC IMAGING:  I have independently reviewed the scans and discussed with the patient. No results found.   ASSESSMENT:  1. 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 -Hospitalized with severe anemia, weakness and pneumonia and shortness of breath on 09/05/2019 through 09/15/2019. -Myeloma labs from 12/12/2019 shows M spike increased to 0.6 g. Kappa light chains increased to 93 and ratio increased to 10.96. -Darzalex single agent started back on 12/09/2019. Pomalidomide discontinued after hospitalization. -Revlimid 10 mg 3 weeks on 1 week off started on 03/01/2020.  2. 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 -H. pylori antibody was positive. Dr. Laural Golden is starting her on antibiotics. -Ultrasound of the abdomen on 11/27/2019 showed increased echogenicity of hepatic parenchyma consistent with steatosis. Mild amount of sludge noted in the gallbladder. 10.3 cm bilobed cyst noted in the left kidney.   PLAN:  1. IgG kappa multiple myeloma: -Continue Revlimid 10 mg 3 weeks on 1 week off.  Continue 20 mg dexamethasone weekly. -Myeloma labs from today are pending. -LFTs reviewed by me shows albumin 2.7.  Creatinine improved.  White count  and platelet count is normal.  Hemoglobin is 10.5. -Proceed with daratumumab today. -RTC 4 weeks with labs. -She reported right ear pain and finished antibiotics prescribed by Dr. Woody Seller.  Pain has not improved.  She will see Dr. Benjamine Bradley.  2. Weight loss: -Her appetite improved and her weight is improving.  She is not taking Carol Bradley.  3. Myeloma bone disease: -Continue Zometa every 3 months.  Continue calcium and vitamin D.  4. CKD: -Creatinine improved to 1.54.  She is taking Lasix 40 mg daily for leg swellings.  5. Normocytic anemia: -Last Feraheme on 01/20/2020.  Hemoglobin today is 10.5.   Orders placed this encounter:  No orders of the defined types were placed in this encounter.    Derek Jack, MD [place] [phone]   I, Milinda Antis, am acting as a scribe for Dr. Sanda Linger.  I, Derek Jack MD, have reviewed the above documentation for accuracy and completeness, and I agree with the above.

## 2020-04-06 NOTE — Progress Notes (Signed)
Labs reviewed with MD today. Creatinine noted by MD today. Hold Aranesp today, hemoglobin 10.5. Proceed as planned. She takes her pre-meds at home.    Treatment given per orders. Patient tolerated it well without problems. Vitals stable and discharged home from clinic ambulatory in stable condition. Follow up as scheduled.

## 2020-04-06 NOTE — Patient Instructions (Signed)
[facility] Discharge Instructions for Patients Receiving Chemotherapy  Today you received the following chemotherapy agents   To help prevent nausea and vomiting after your treatment, we encourage you to take your nausea medication   If you develop nausea and vomiting that is not controlled by your nausea medication, call the clinic.   BELOW ARE SYMPTOMS THAT SHOULD BE REPORTED IMMEDIATELY:  *FEVER GREATER THAN 100.5 F  *CHILLS WITH OR WITHOUT FEVER  NAUSEA AND VOMITING THAT IS NOT CONTROLLED WITH YOUR NAUSEA MEDICATION  *UNUSUAL SHORTNESS OF BREATH  *UNUSUAL BRUISING OR BLEEDING  TENDERNESS IN MOUTH AND THROAT WITH OR WITHOUT PRESENCE OF ULCERS  *URINARY PROBLEMS  *BOWEL PROBLEMS  UNUSUAL RASH Items with * indicate a potential emergency and should be followed up as soon as possible.  Feel free to call the clinic should you have any questions or concerns. The clinic phone number is [phone].  Please show the CHEMO ALERT CARD at check-in to the Emergency Department and triage nurse.   

## 2020-04-07 LAB — PROTEIN ELECTROPHORESIS, SERUM
A/G Ratio: 1 (ref 0.7–1.7)
Albumin ELP: 2.6 g/dL — ABNORMAL LOW (ref 2.9–4.4)
Alpha-1-Globulin: 0.3 g/dL (ref 0.0–0.4)
Alpha-2-Globulin: 0.8 g/dL (ref 0.4–1.0)
Beta Globulin: 0.7 g/dL (ref 0.7–1.3)
Gamma Globulin: 0.7 g/dL (ref 0.4–1.8)
Globulin, Total: 2.5 g/dL (ref 2.2–3.9)
M-Spike, %: 0.5 g/dL — ABNORMAL HIGH
Total Protein ELP: 5.1 g/dL — ABNORMAL LOW (ref 6.0–8.5)

## 2020-04-07 LAB — KAPPA/LAMBDA LIGHT CHAINS
Kappa free light chain: 232.3 mg/L — ABNORMAL HIGH (ref 3.3–19.4)
Kappa, lambda light chain ratio: 26.4 — ABNORMAL HIGH (ref 0.26–1.65)
Lambda free light chains: 8.8 mg/L (ref 5.7–26.3)

## 2020-04-14 ENCOUNTER — Other Ambulatory Visit (HOSPITAL_COMMUNITY): Payer: Self-pay

## 2020-04-14 ENCOUNTER — Other Ambulatory Visit: Payer: Self-pay | Admitting: Medical

## 2020-04-14 DIAGNOSIS — C9000 Multiple myeloma not having achieved remission: Secondary | ICD-10-CM

## 2020-04-14 MED ORDER — LENALIDOMIDE 10 MG PO CAPS: ORAL_CAPSULE | ORAL | 10 refills | Status: DC

## 2020-04-14 NOTE — Telephone Encounter (Signed)
This is Dr. Jordan's pt. °

## 2020-04-14 NOTE — Telephone Encounter (Signed)
Chart reviewed. Revlimid refilled per Dr. Delton Coombes.

## 2020-04-20 ENCOUNTER — Inpatient Hospital Stay (HOSPITAL_COMMUNITY): Payer: Medicare Other

## 2020-04-20 ENCOUNTER — Other Ambulatory Visit: Payer: Self-pay | Admitting: Cardiology

## 2020-04-20 ENCOUNTER — Other Ambulatory Visit (HOSPITAL_COMMUNITY): Payer: Self-pay | Admitting: *Deleted

## 2020-04-20 ENCOUNTER — Inpatient Hospital Stay (HOSPITAL_COMMUNITY): Payer: Medicare Other | Attending: Hematology

## 2020-04-20 ENCOUNTER — Other Ambulatory Visit: Payer: Self-pay

## 2020-04-20 VITALS — BP 130/77 | HR 52 | Temp 96.9°F | Resp 18 | Wt 159.4 lb

## 2020-04-20 DIAGNOSIS — Z5112 Encounter for antineoplastic immunotherapy: Secondary | ICD-10-CM | POA: Insufficient documentation

## 2020-04-20 DIAGNOSIS — E785 Hyperlipidemia, unspecified: Secondary | ICD-10-CM | POA: Diagnosis not present

## 2020-04-20 DIAGNOSIS — E538 Deficiency of other specified B group vitamins: Secondary | ICD-10-CM

## 2020-04-20 DIAGNOSIS — Z87891 Personal history of nicotine dependence: Secondary | ICD-10-CM | POA: Diagnosis not present

## 2020-04-20 DIAGNOSIS — Z9221 Personal history of antineoplastic chemotherapy: Secondary | ICD-10-CM | POA: Diagnosis not present

## 2020-04-20 DIAGNOSIS — I2510 Atherosclerotic heart disease of native coronary artery without angina pectoris: Secondary | ICD-10-CM | POA: Diagnosis not present

## 2020-04-20 DIAGNOSIS — N189 Chronic kidney disease, unspecified: Secondary | ICD-10-CM | POA: Diagnosis present

## 2020-04-20 DIAGNOSIS — C9000 Multiple myeloma not having achieved remission: Secondary | ICD-10-CM

## 2020-04-20 DIAGNOSIS — I639 Cerebral infarction, unspecified: Secondary | ICD-10-CM

## 2020-04-20 DIAGNOSIS — Q2733 Arteriovenous malformation of digestive system vessel: Secondary | ICD-10-CM | POA: Diagnosis not present

## 2020-04-20 DIAGNOSIS — K259 Gastric ulcer, unspecified as acute or chronic, without hemorrhage or perforation: Secondary | ICD-10-CM | POA: Diagnosis not present

## 2020-04-20 DIAGNOSIS — I10 Essential (primary) hypertension: Secondary | ICD-10-CM | POA: Diagnosis not present

## 2020-04-20 DIAGNOSIS — Z79899 Other long term (current) drug therapy: Secondary | ICD-10-CM | POA: Diagnosis not present

## 2020-04-20 DIAGNOSIS — D631 Anemia in chronic kidney disease: Secondary | ICD-10-CM | POA: Insufficient documentation

## 2020-04-20 DIAGNOSIS — Z7982 Long term (current) use of aspirin: Secondary | ICD-10-CM | POA: Insufficient documentation

## 2020-04-20 DIAGNOSIS — J449 Chronic obstructive pulmonary disease, unspecified: Secondary | ICD-10-CM | POA: Diagnosis not present

## 2020-04-20 DIAGNOSIS — Z23 Encounter for immunization: Secondary | ICD-10-CM | POA: Diagnosis not present

## 2020-04-20 DIAGNOSIS — I4891 Unspecified atrial fibrillation: Secondary | ICD-10-CM

## 2020-04-20 DIAGNOSIS — D509 Iron deficiency anemia, unspecified: Secondary | ICD-10-CM | POA: Insufficient documentation

## 2020-04-20 DIAGNOSIS — I5032 Chronic diastolic (congestive) heart failure: Secondary | ICD-10-CM

## 2020-04-20 LAB — CBC WITH DIFFERENTIAL/PLATELET
Abs Immature Granulocytes: 0.03 10*3/uL (ref 0.00–0.07)
Basophils Absolute: 0 10*3/uL (ref 0.0–0.1)
Basophils Relative: 0 %
Eosinophils Absolute: 0.9 10*3/uL — ABNORMAL HIGH (ref 0.0–0.5)
Eosinophils Relative: 20 %
HCT: 32.9 % — ABNORMAL LOW (ref 36.0–46.0)
Hemoglobin: 10.6 g/dL — ABNORMAL LOW (ref 12.0–15.0)
Immature Granulocytes: 1 %
Lymphocytes Relative: 11 %
Lymphs Abs: 0.5 10*3/uL — ABNORMAL LOW (ref 0.7–4.0)
MCH: 31.8 pg (ref 26.0–34.0)
MCHC: 32.2 g/dL (ref 30.0–36.0)
MCV: 98.8 fL (ref 80.0–100.0)
Monocytes Absolute: 0.5 10*3/uL (ref 0.1–1.0)
Monocytes Relative: 12 %
Neutro Abs: 2.5 10*3/uL (ref 1.7–7.7)
Neutrophils Relative %: 56 %
Platelets: 230 10*3/uL (ref 150–400)
RBC: 3.33 MIL/uL — ABNORMAL LOW (ref 3.87–5.11)
RDW: 16.5 % — ABNORMAL HIGH (ref 11.5–15.5)
WBC: 4.4 10*3/uL (ref 4.0–10.5)
nRBC: 0 % (ref 0.0–0.2)

## 2020-04-20 LAB — COMPREHENSIVE METABOLIC PANEL
ALT: 22 U/L (ref 0–44)
AST: 19 U/L (ref 15–41)
Albumin: 3 g/dL — ABNORMAL LOW (ref 3.5–5.0)
Alkaline Phosphatase: 91 U/L (ref 38–126)
Anion gap: 12 (ref 5–15)
BUN: 26 mg/dL — ABNORMAL HIGH (ref 8–23)
CO2: 24 mmol/L (ref 22–32)
Calcium: 8.2 mg/dL — ABNORMAL LOW (ref 8.9–10.3)
Chloride: 101 mmol/L (ref 98–111)
Creatinine, Ser: 1.67 mg/dL — ABNORMAL HIGH (ref 0.44–1.00)
GFR, Estimated: 29 mL/min — ABNORMAL LOW (ref >60)
Glucose, Bld: 120 mg/dL — ABNORMAL HIGH (ref 70–99)
Potassium: 3.5 mmol/L (ref 3.5–5.1)
Sodium: 137 mmol/L (ref 135–145)
Total Bilirubin: 0.9 mg/dL (ref 0.3–1.2)
Total Protein: 6.1 g/dL — ABNORMAL LOW (ref 6.5–8.1)

## 2020-04-20 MED ORDER — DIPHENHYDRAMINE HCL 25 MG PO CAPS: 50.0000 mg | ORAL_CAPSULE | Freq: Once | ORAL | Status: DC

## 2020-04-20 MED ORDER — CYANOCOBALAMIN 1000 MCG/ML IJ SOLN
1000.0000 ug | Freq: Once | INTRAMUSCULAR | Status: AC
  Administered 2020-04-20: 1000 ug via INTRAMUSCULAR

## 2020-04-20 MED ORDER — AMOXICILLIN 500 MG PO CAPS: 500.0000 mg | ORAL_CAPSULE | Freq: Three times a day (TID) | ORAL | 0 refills | Status: DC

## 2020-04-20 MED ORDER — DEXAMETHASONE 4 MG PO TABS: 10.0000 mg | ORAL_TABLET | Freq: Once | ORAL | Status: DC

## 2020-04-20 MED ORDER — DARATUMUMAB-HYALURONIDASE-FIHJ 1800-30000 MG-UT/15ML ~~LOC~~ SOLN
1800.0000 mg | Freq: Once | SUBCUTANEOUS | Status: AC
  Administered 2020-04-20: 1800 mg via SUBCUTANEOUS
  Filled 2020-04-20: qty 15

## 2020-04-20 MED ORDER — CYANOCOBALAMIN 1000 MCG/ML IJ SOLN
INTRAMUSCULAR | Status: AC
  Filled 2020-04-20: qty 1

## 2020-04-20 MED ORDER — ACETAMINOPHEN 325 MG PO TABS: 650.0000 mg | ORAL_TABLET | Freq: Once | ORAL | Status: DC

## 2020-04-20 NOTE — Patient Instructions (Signed)
[facility] Discharge Instructions for Patients Receiving Chemotherapy   Beginning January 23rd 2017 lab work for the Cancer Center will be done in the  Main lab at  on 1st floor. If you have a lab appointment with the Cancer Center please come in thru the  Main Entrance and check in at the main information desk   Today you received the following chemotherapy agents Daratumumab  To help prevent nausea and vomiting after your treatment, we encourage you to take your nausea medication   If you develop nausea and vomiting, or diarrhea that is not controlled by your medication, call the clinic.  The clinic phone number is [phone]. Office hours are Monday-Friday 8:30am-5:00pm.  BELOW ARE SYMPTOMS THAT SHOULD BE REPORTED IMMEDIATELY:  *FEVER GREATER THAN 101.0 F  *CHILLS WITH OR WITHOUT FEVER  NAUSEA AND VOMITING THAT IS NOT CONTROLLED WITH YOUR NAUSEA MEDICATION  *UNUSUAL SHORTNESS OF BREATH  *UNUSUAL BRUISING OR BLEEDING  TENDERNESS IN MOUTH AND THROAT WITH OR WITHOUT PRESENCE OF ULCERS  *URINARY PROBLEMS  *BOWEL PROBLEMS  UNUSUAL RASH Items with * indicate a potential emergency and should be followed up as soon as possible. If you have an emergency after office hours please contact your primary care physician or go to the nearest emergency department.  Please call the clinic during office hours if you have any questions or concerns.   You may also contact the Patient Navigator at [phone] should you have any questions or need assistance in obtaining follow up care.      Resources For Cancer Patients and their Caregivers ? American Cancer Society: Can assist with transportation, wigs, general needs, runs Look Good Feel Better.        1-888-227-6333 ? Cancer Care: Provides financial assistance, online support groups, medication/co-pay assistance.  1-800-813-HOPE (4673) ? [facility] Assists [place]  cancer patients and their families through emotional , educational and financial support.  336-427-4357 ? [place] DSS Where to apply for food stamps, Medicaid and utility assistance. 336-342-1394 ? [facility]: Transportation to medical appointments. 336-347-2287 ? Social Security Administration: May apply for disability if have a Stage IV cancer. 336-342-7796 1-800-772-1213 ? [place] Aging, Disability and Transit Services: Assists with nutrition, care and transit needs. 336-349-2343          

## 2020-04-20 NOTE — Progress Notes (Signed)
Carol Bradley presents today for D15C6 Daratumumab. Pt denies any new changes or symptoms since last treatment. Lab results and vitals have been reviewed and are stable and within parameters for treatment. Cr 1.67 has been reviewed and is patients baseline. Proceeding with treatment today as planned. Pt reports she took her premedications upon arrival to the lab at 1300.  Injections tolerated without incident or complaint, see MAR for details. Discharged in satisfactory condition with follow up instructions.

## 2020-05-04 ENCOUNTER — Inpatient Hospital Stay (HOSPITAL_BASED_OUTPATIENT_CLINIC_OR_DEPARTMENT_OTHER): Payer: Medicare Other | Admitting: Hematology

## 2020-05-04 ENCOUNTER — Other Ambulatory Visit: Payer: Self-pay

## 2020-05-04 ENCOUNTER — Inpatient Hospital Stay (HOSPITAL_COMMUNITY): Payer: Medicare Other

## 2020-05-04 VITALS — BP 136/59 | HR 84 | Temp 96.8°F | Resp 20 | Wt 165.5 lb

## 2020-05-04 VITALS — BP 122/56 | HR 80 | Temp 98.1°F | Resp 18

## 2020-05-04 DIAGNOSIS — M546 Pain in thoracic spine: Secondary | ICD-10-CM

## 2020-05-04 DIAGNOSIS — C9000 Multiple myeloma not having achieved remission: Secondary | ICD-10-CM | POA: Diagnosis not present

## 2020-05-04 DIAGNOSIS — N183 Chronic kidney disease, stage 3 unspecified: Secondary | ICD-10-CM

## 2020-05-04 DIAGNOSIS — D509 Iron deficiency anemia, unspecified: Secondary | ICD-10-CM

## 2020-05-04 DIAGNOSIS — D638 Anemia in other chronic diseases classified elsewhere: Secondary | ICD-10-CM

## 2020-05-04 DIAGNOSIS — E538 Deficiency of other specified B group vitamins: Secondary | ICD-10-CM

## 2020-05-04 DIAGNOSIS — Z5112 Encounter for antineoplastic immunotherapy: Secondary | ICD-10-CM | POA: Diagnosis not present

## 2020-05-04 DIAGNOSIS — D801 Nonfamilial hypogammaglobulinemia: Secondary | ICD-10-CM

## 2020-05-04 LAB — COMPREHENSIVE METABOLIC PANEL
ALT: 57 U/L — ABNORMAL HIGH (ref 0–44)
AST: 50 U/L — ABNORMAL HIGH (ref 15–41)
Albumin: 3 g/dL — ABNORMAL LOW (ref 3.5–5.0)
Alkaline Phosphatase: 90 U/L (ref 38–126)
Anion gap: 6 (ref 5–15)
BUN: 28 mg/dL — ABNORMAL HIGH (ref 8–23)
CO2: 23 mmol/L (ref 22–32)
Calcium: 8 mg/dL — ABNORMAL LOW (ref 8.9–10.3)
Chloride: 108 mmol/L (ref 98–111)
Creatinine, Ser: 1.38 mg/dL — ABNORMAL HIGH (ref 0.44–1.00)
GFR, Estimated: 39 mL/min — ABNORMAL LOW (ref >60)
Glucose, Bld: 97 mg/dL (ref 70–99)
Potassium: 4.3 mmol/L (ref 3.5–5.1)
Sodium: 137 mmol/L (ref 135–145)
Total Bilirubin: 1 mg/dL (ref 0.3–1.2)
Total Protein: 5.9 g/dL — ABNORMAL LOW (ref 6.5–8.1)

## 2020-05-04 LAB — CBC WITH DIFFERENTIAL/PLATELET
Abs Immature Granulocytes: 0.03 10*3/uL (ref 0.00–0.07)
Basophils Absolute: 0 10*3/uL (ref 0.0–0.1)
Basophils Relative: 0 %
Eosinophils Absolute: 0.2 10*3/uL (ref 0.0–0.5)
Eosinophils Relative: 3 %
HCT: 30.9 % — ABNORMAL LOW (ref 36.0–46.0)
Hemoglobin: 9.6 g/dL — ABNORMAL LOW (ref 12.0–15.0)
Immature Granulocytes: 1 %
Lymphocytes Relative: 11 %
Lymphs Abs: 0.6 10*3/uL — ABNORMAL LOW (ref 0.7–4.0)
MCH: 32.7 pg (ref 26.0–34.0)
MCHC: 31.1 g/dL (ref 30.0–36.0)
MCV: 105.1 fL — ABNORMAL HIGH (ref 80.0–100.0)
Monocytes Absolute: 0.5 10*3/uL (ref 0.1–1.0)
Monocytes Relative: 9 %
Neutro Abs: 4.3 10*3/uL (ref 1.7–7.7)
Neutrophils Relative %: 76 %
Platelets: 176 10*3/uL (ref 150–400)
RBC: 2.94 MIL/uL — ABNORMAL LOW (ref 3.87–5.11)
RDW: 18.6 % — ABNORMAL HIGH (ref 11.5–15.5)
WBC: 5.6 10*3/uL (ref 4.0–10.5)
nRBC: 0 % (ref 0.0–0.2)

## 2020-05-04 LAB — IRON AND TIBC
Iron: 114 ug/dL (ref 28–170)
Saturation Ratios: 44 % — ABNORMAL HIGH (ref 10.4–31.8)
TIBC: 261 ug/dL (ref 250–450)
UIBC: 147 ug/dL

## 2020-05-04 LAB — LACTATE DEHYDROGENASE: LDH: 190 U/L (ref 98–192)

## 2020-05-04 LAB — FERRITIN: Ferritin: 179 ng/mL (ref 11–307)

## 2020-05-04 MED ORDER — DEXAMETHASONE 4 MG PO TABS: 10.0000 mg | ORAL_TABLET | Freq: Once | ORAL | Status: DC

## 2020-05-04 MED ORDER — DIPHENHYDRAMINE HCL 25 MG PO CAPS: 50.0000 mg | ORAL_CAPSULE | Freq: Once | ORAL | Status: DC

## 2020-05-04 MED ORDER — INFLUENZA VAC A&B SA ADJ QUAD 0.5 ML IM PRSY
0.5000 mL | PREFILLED_SYRINGE | Freq: Once | INTRAMUSCULAR | Status: AC
  Administered 2020-05-04: 0.5 mL via INTRAMUSCULAR
  Filled 2020-05-04: qty 0.5

## 2020-05-04 MED ORDER — SODIUM CHLORIDE 0.9 % IV SOLN: INTRAVENOUS | Status: DC

## 2020-05-04 MED ORDER — DARATUMUMAB-HYALURONIDASE-FIHJ 1800-30000 MG-UT/15ML ~~LOC~~ SOLN
1800.0000 mg | Freq: Once | SUBCUTANEOUS | Status: AC
  Administered 2020-05-04: 1800 mg via SUBCUTANEOUS
  Filled 2020-05-04: qty 15

## 2020-05-04 MED ORDER — SODIUM CHLORIDE 0.9 % IV SOLN
510.0000 mg | Freq: Once | INTRAVENOUS | Status: AC
  Administered 2020-05-04: 510 mg via INTRAVENOUS
  Filled 2020-05-04: qty 510

## 2020-05-04 MED ORDER — ACETAMINOPHEN 325 MG PO TABS: 650.0000 mg | ORAL_TABLET | Freq: Once | ORAL | Status: DC

## 2020-05-04 MED ORDER — INFLUENZA VAC A&B SA ADJ QUAD 0.5 ML IM PRSY: 0.5000 mL | PREFILLED_SYRINGE | Freq: Once | INTRAMUSCULAR | Status: AC

## 2020-05-04 MED ORDER — DARBEPOETIN ALFA 300 MCG/0.6ML IJ SOSY: 300.0000 ug | PREFILLED_SYRINGE | Freq: Once | INTRAMUSCULAR | Status: DC

## 2020-05-04 NOTE — Progress Notes (Signed)
1100 Labs reviewed with and pt seen by Dr. Delton Coombes and pt approved for Darzalex injection today as well as Feraheme infusion and Influenza vaccine ordered per MD                              Carol Bradley tolerated Darzalex injection,Feraheme infusion and Influenza vaccine well without complaints or incident. Peripheral IV site checked with positive blood return noted prior to and after infusion complete. VSS upon discharge. Pt discharged self ambulatory using her cane in satisfactory condition

## 2020-05-04 NOTE — Progress Notes (Signed)
Patient was assessed by Dr. Delton Coombes and labs have been reviewed.  She has increasing shortness of breath and her hemoglobin is dropping, per Dr. Delton Coombes, he wants patient to get feraheme today and then next week. We will also check her ferritin and iron/tibc today. She is okay to proceed with treatment today. Primary RN and pharmacy aware.

## 2020-05-04 NOTE — Progress Notes (Signed)
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    03/18/2015 - 07/28/2015 Chemotherapy   Velcade 1.6 mg/m2 discontinued secondary to intolerance   07/28/2015 Adverse Reaction   stool incontinence, weakness, collapse upon standing, felt to be secondary to velcade   11/09/2015 - 12/13/2015 Chemotherapy   cytoxan IV 300 mg/m2 administered X 2 doses only in addition to rev/dex   11/09/2015 - 06/08/2016 Chemotherapy   Revlimid/Dexamethasone    02/05/2017 - 07/09/2017 Chemotherapy   The patient had bortezomib SQ (VELCADE) chemo injection 1.75 mg, 1 mg/m2 = 1.75 mg (66.7 % of original dose 1.5 mg/m2), Subcutaneous,  Once, 1 of 8 cycles Dose modification: 1.5 mg/m2 (original dose 1.5 mg/m2, Cycle 1, Reason: Provider Judgment, Comment: Per Dr. Norma Fredrickson recommendations from [facility] 1 mg/m2 (original dose 1.5 mg/m2, Cycle 1, Reason: Provider Judgment)  for chemotherapy treatment.     07/09/2017 Adverse Reaction   Diarrhea    Chemotherapy   Pomalyst 2 mg (Days 1-21 every 28 days), Ixazomib 2.3 mg (days 1, 8, 15 every 28 days), and Dexamethasone 10 mg (weekly)- Rx's printed on 08/24/2017.  Treatment recommendations from Dr. Norma Fredrickson at [facility].   07/28/2019 -  Chemotherapy   The patient had dexamethasone (DECADRON) tablet 20 mg, 20 mg, Oral, Once, 6 of 10  cycles Dose modification: 10 mg (original dose 10 mg, Cycle 1) Administration: 20 mg (07/28/2019), 10 mg (08/04/2019), 10 mg (08/11/2019), 10 mg (08/25/2019), 10 mg (12/09/2019), 10 mg (12/16/2019), 10 mg (12/23/2019), 10 mg (12/30/2019), 10 mg (01/06/2020), 10 mg (01/13/2020), 10 mg (03/23/2020) daratumumab-hyaluronidase-fihj (DARZALEX FASPRO) 1800-30000 MG-UT/15ML chemo SQ injection 1,800 mg, 1,800 mg, Subcutaneous,  Once, 6 of 10 cycles Administration: 1,800 mg (07/28/2019), 1,800 mg (08/04/2019), 1,800 mg (08/11/2019), 1,800 mg (08/25/2019), 1,800 mg (12/09/2019), 1,800 mg (12/16/2019), 1,800 mg (12/23/2019), 1,800 mg (12/30/2019), 1,800 mg (01/06/2020), 1,800 mg (01/13/2020), 1,800 mg (02/10/2020), 1,800 mg (02/24/2020), 1,800 mg (01/20/2020), 1,800 mg (01/27/2020), 1,800 mg (03/09/2020), 1,800 mg (03/23/2020), 1,800 mg (04/06/2020), 1,800 mg (04/20/2020)  for chemotherapy treatment.      CANCER STAGING: Cancer Staging No matching staging information was found for the patient.  INTERVAL HISTORY:  [name], a 79 y.o. female, returns for routine follow-up and consideration for next cycle of chemotherapy. Carol Bradley was last seen on 04/06/2020.  Due for cycle #7 of Darzalex Faspro today.   Overall, she tells me she has been feeling pretty well. She complains of having issues with her ears and will see Dr. Benjamine Mola again in a couple days. She finished taking amoxicillin on 10/17. She takes Tylenol PRN. She is taking Revlimid 3 weeks on and 1 week off and Decadron 10 mg weekly. She is tolerating the Darzalex Faspro and Revlimid well and denies having diarrhea, though her energy is at 50% and she short of breath requiring a break. She is not sleeping as well  and can fall asleep during the day. Her appetite is good.  Overall, she feels ready for next cycle of chemo today.    REVIEW OF SYSTEMS:  Review of Systems  Constitutional: Positive for appetite change (70%) and fatigue (50%).  Respiratory: Positive for shortness of breath  (w/ exertion).   Gastrointestinal: Negative for diarrhea.  Neurological: Positive for headaches and numbness.  All other systems reviewed and are negative.   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   Multiple myeloma (HCC)    Vitamin B12 deficiency 04/15/2016   Overview:  Vitamin B12 level documented 155, January 2016 with the normal range being 211-924   Past Surgical History:  Procedure Laterality Date   BACK SURGERY     CARDIAC CATHETERIZATION  04/2011   right and left cath showing normal right heart pressures,but newly diagnosed coronary artery disease/drug eluting stent placed to RCA with residual disease in the proximal RCA and LAD and ramus, normal LV function and 60-65% EF   COLONOSCOPY N/A 09/30/2014   Procedure: COLONOSCOPY;  Surgeon: Rogene Houston, MD;  [facility];  Service: Endoscopy;  Laterality: N/A;  225   CORONARY ANGIOPLASTY WITH STENT PLACEMENT  04/2011   mid RCA: 3.0 X38 mm Promus DES. Residual 40% disease proximally   ESOPHAGOGASTRODUODENOSCOPY N/A 09/10/2019   Procedure: ESOPHAGOGASTRODUODENOSCOPY (EGD);  Surgeon: Clarene Essex, MD;  Location: [facility];  Service: Endoscopy;  Laterality: N/A;   NECK SURGERY      SOCIAL HISTORY:  Social History   Socioeconomic History   Marital status: Widowed    Spouse name: Not on file   Number of children: Not on file   Years of education: Not on file   Highest education level: Not on file  Occupational History   Occupation: RETIRED    Comment: [employer]  Tobacco Use   Smoking status: Former Smoker    Packs/day: 3.00    Years: 25.00    Pack years: 75.00    Types: Cigarettes    Quit date:  07/10/1994    Years since quitting: 25.8   Smokeless tobacco: Never Used  Vaping Use   Vaping Use: Never used  Substance and Sexual Activity   Alcohol use: No   Drug use: Never   Sexual activity: Not on file  Other Topics Concern   Not on file  Social History Narrative   Not on file   Social Determinants of Health   Financial Resource Strain:    Difficulty of Paying Living Expenses: Not on file  Food Insecurity: No Food Insecurity   Worried About [place] in the Last Year: Never true   [place] in the Last Year: Never true  Transportation Needs: No Transportation Needs   Lack of Transportation (Medical): No   Lack of Transportation (Non-Medical): No  Physical Activity:    Days of Exercise per Week: Not on file   Minutes of Exercise per Session: Not on file  Stress:    Feeling of Stress : Not on file  Social Connections:    Frequency of Communication with Friends and Family: Not on file   Frequency of Social Gatherings with Friends and Family: Not  on file   Attends Religious Services: Not on file   Active Member of Clubs or Organizations: Not on file   Attends Archivist Meetings: Not on file   Marital Status: Not on file  Intimate Partner Violence:    Fear of Current or Ex-Partner: Not on file   Emotionally Abused: Not on file   Physically Abused: Not on file   Sexually Abused: Not on file    FAMILY HISTORY:  Family History  Problem Relation Age of Onset   Heart failure Mother    Cancer Father     CURRENT MEDICATIONS:  Current Outpatient Medications  Medication Sig Dispense Refill   albuterol (VENTOLIN HFA) 108 (90 Base) MCG/ACT inhaler Inhale 2 puffs into the lungs every 6 (six) hours as needed for wheezing or shortness of breath. 18 g 2   amoxicillin (AMOXIL) 500 MG capsule Take 1 capsule (500 mg total) by mouth 3 (three) times daily. 15 capsule 0   aspirin 325 MG tablet Take 325 mg by mouth at bedtime.      atorvastatin (LIPITOR) 10 MG tablet TAKE ONE TABLET BY MOUTH DAILY 90 tablet 1   Calcium Carbonate-Vit D-Min (CALCIUM 600+D PLUS MINERALS) 600-400 MG-UNIT CHEW Chew 1 tablet by mouth 3 (three) times daily.      cyanocobalamin (,VITAMIN B-12,) 1000 MCG/ML injection Inject 1,000 mcg into the skin every 30 (thirty) days.      daratumumab-hyaluronidase-fihj (DARZALEX FASPRO) 1800-30000 MG-UT/15ML SOLN Inject 1,800 mg into the skin once.     dexamethasone (DECADRON) 2 MG tablet Take 5 tablets once weekly. 30 tablet 3   diazepam (VALIUM) 5 MG tablet TAKE ONE TABLET BY MOUTH EVERY 6 HOURS AS NEEDED FOR ANXIETY 30 tablet 1   diltiazem (CARDIZEM CD) 240 MG 24 hr capsule TAKE ONE CAPSULE BY MOUTH EVERY DAY 90 capsule 3   famotidine (PEPCID) 20 MG tablet Take 20 mg by mouth 2 (two) times daily as needed.      fluticasone (FLONASE) 50 MCG/ACT nasal spray Place 2 sprays into both nostrils as needed.      ipratropium (ATROVENT) 0.06 % nasal spray SMARTSIG:2 Spray(s) Both Nares Twice Daily PRN     lenalidomide (REVLIMID) 10 MG capsule Take 1 capsule (10 mg total) by mouth daily as directed. 21 capsule 10   magnesium oxide (MAG-OX) 400 MG tablet Take 1 tablet by mouth 2 (two) times daily.     metoprolol tartrate (LOPRESSOR) 25 MG tablet TAKE 1 AND 1/2 TABLETS BY MOUTH TWICE DAILY 75 tablet 3   pantoprazole (PROTONIX) 40 MG tablet Take 1 tablet (40 mg total) by mouth 2 (two) times daily. 60 tablet 0   polyethylene glycol powder (GLYCOLAX/MIRALAX) 17 GM/SCOOP powder Take by mouth as needed.      potassium chloride (KLOR-CON) 10 MEQ tablet TAKE ONE TABLET BY MOUTH THREE TIMES DAILY. 90 tablet 4   tetracycline (SUMYCIN) 500 MG capsule Take 500 mg by mouth 2 (two) times daily.      umeclidinium-vilanterol (ANORO ELLIPTA) 62.5-25 MCG/INH AEPB Inhale 1 puff into the lungs daily. 60 each 1   furosemide (LASIX) 40 MG tablet Take 40 mg by mouth daily as needed. (Patient not taking: Reported on  05/04/2020)     ondansetron (ZOFRAN) 8 MG tablet Take 1 tablet (8 mg total) by mouth every 8 (eight) hours as needed for nausea or vomiting. (Patient not taking: Reported on 05/04/2020) 20 tablet 0   Current Facility-Administered Medications  Medication Dose Route Frequency Provider Last Rate  Last Admin   influenza vaccine adjuvanted (FLUAD) injection 0.5 mL  0.5 mL Intramuscular Once Derek Jack, MD        ALLERGIES:  Allergies  Allergen Reactions   Lisinopril Cough   Plavix [Clopidogrel Bisulfate] Swelling    PHYSICAL EXAM:  Performance status (ECOG): 1 - Symptomatic but completely ambulatory  Vitals:   05/04/20 1019  BP: (!) 136/59  Pulse: 84  Resp: 20  Temp: (!) 96.8 F (36 C)  SpO2: 99%   Wt Readings from Last 3 Encounters:  05/04/20 165 lb 8 oz (75.1 kg)  04/20/20 159 lb 6.4 oz (72.3 kg)  04/06/20 163 lb (73.9 kg)   Physical Exam Vitals reviewed.  Constitutional:      Appearance: Normal appearance.  Cardiovascular:     Rate and Rhythm: Normal rate and regular rhythm.     Pulses: Normal pulses.     Heart sounds: Normal heart sounds.  Pulmonary:     Effort: Pulmonary effort is normal.     Breath sounds: Normal breath sounds.  Musculoskeletal:     Right lower leg: No edema.     Left lower leg: No edema.  Neurological:     General: No focal deficit present.     Mental Status: She is alert and oriented to person, place, and time.  Psychiatric:        Mood and Affect: Mood normal.        Behavior: Behavior normal.     LABORATORY DATA:  I have reviewed the labs as listed.  CBC Latest Ref Rng & Units 05/04/2020 04/20/2020 04/06/2020  WBC 4.0 - 10.5 K/uL 5.6 4.4 9.9  Hemoglobin 12.0 - 15.0 g/dL 9.6(L) 10.6(L) 10.5(L)  Hematocrit 36 - 46 % 30.9(L) 32.9(L) 33.9(L)  Platelets 150 - 400 K/uL 176 230 176   CMP Latest Ref Rng & Units 05/04/2020 04/20/2020 04/06/2020  Glucose 70 - 99 mg/dL 97 120(H) 149(H)  BUN 8 - 23 mg/dL 28(H) 26(H) 27(H)    Creatinine 0.44 - 1.00 mg/dL 1.38(H) 1.67(H) 1.54(H)  Sodium 135 - 145 mmol/L 137 137 141  Potassium 3.5 - 5.1 mmol/L 4.3 3.5 3.8  Chloride 98 - 111 mmol/L 108 101 107  CO2 22 - 32 mmol/L _0 Calcium 8.9 - 10.3 mg/dL 8.0(L) 8.2(L) 8.6(L)  Total Protein 6.5 - 8.1 g/dL 5.9(L) 6.1(L) 6.0(L)  Total Bilirubin 0.3 - 1.2 mg/dL 1.0 0.9 0.8  Alkaline Phos 38 - 126 U/L 90 91 66  AST 15 - 41 U/L 50(H) 19 18  ALT 0 - 44 U/L 57(H) 22 14   Lab Results  Component Value Date   LDH 190 05/04/2020   LDH 145 04/06/2020   LDH 176 02/24/2020   Lab Results  Component Value Date   TOTALPROTELP 5.1 (L) 04/06/2020   ALBUMINELP 2.6 (L) 04/06/2020   A1GS 0.3 04/06/2020   A2GS 0.8 04/06/2020   BETS 0.7 04/06/2020   GAMS 0.7 04/06/2020   MSPIKE 0.5 (H) 04/06/2020   SPEI Comment 04/06/2020    Lab Results  Component Value Date   KPAFRELGTCHN 232.3 (H) 04/06/2020   LAMBDASER 8.8 04/06/2020   KAPLAMBRATIO 26.40 (H) 04/06/2020    DIAGNOSTIC IMAGING:  I have independently reviewed the scans and discussed with the patient. CARDIAC EVENT MONITOR  Result Date: 04/20/2020  Normal sinus rhythm  Rare PACs  Very rare PVCs  No atrial fibrillation noted      ASSESSMENT:  1. 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 -Hospitalized with severe anemia, weakness and pneumonia and shortness of breath on 09/05/2019 through 09/15/2019. -Myeloma labs from 12/12/2019 shows M spike increased to 0.6 g. Kappa light chains increased to 93 and ratio increased to 10.96. -Darzalex single agent started back on 12/09/2019. Pomalidomide discontinued after hospitalization. -Revlimid 10 mg 3 weeks on 1 week off started on 03/01/2020.  2. 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 -H. pylori antibody was positive. Dr. Laural Golden is starting her on antibiotics. -Ultrasound of  the abdomen on 11/27/2019 showed increased echogenicity of hepatic parenchyma consistent with steatosis. Mild amount of sludge noted in the gallbladder. 10.3 cm bilobed cyst noted in the left kidney.   PLAN:  1. IgG kappa multiple myeloma: -She is tolerating Revlimid 10 mg 3 weeks on 1 week off very well.  She is taking dexamethasone 10 mg weekly. -M spike on 03/29/2020 improved to 0.5 g from 0.6 g previously. -Kappa light chains are 232 and ratio is twenty-six.  We will closely monitor. -She will proceed with monthly daratumumab. -RTC 4 weeks for follow-up.  We will follow up on today's myeloma panel.  2. Weight loss: -Weight and appetite continues to improve.  Not taking Marinol.  3. Myeloma bone disease: -Continue Zometa every 3 months.  Continue calcium and vitamin D.  4. CKD: -Creatinine improved to 1.38. -Continue Lasix as needed.  5. Normocytic anemia: -She complains of extreme fatigue.  Today hemoglobin is 9.6.  Ferritin is 179. -Recommend Feraheme x2.   Orders placed this encounter:  No orders of the defined types were placed in this encounter.    Derek Jack, MD [place] [phone]   I, Milinda Antis, am acting as a scribe for Dr. Sanda Linger.  I, Derek Jack MD, have reviewed the above documentation for accuracy and completeness, and I agree with the above.

## 2020-05-04 NOTE — Patient Instructions (Signed)
[facility] Discharge Instructions for Patients Receiving Chemotherapy   Beginning January 23rd 2017 lab work for the [facility] will be done in the  Main lab at [facility] on 1st floor. If you have a lab appointment with the [facility] please come in thru the  Main Entrance and check in at the main information desk   Today you received the following chemotherapy agents Darzalex injection as well as Feraheme infusion and Influenza vaccine. Follow-up as scheduled  To help prevent nausea and vomiting after your treatment, we encourage you to take your nausea medication   If you develop nausea and vomiting, or diarrhea that is not controlled by your medication, call the clinic.  The clinic phone number is [phone]. Office hours are Monday-Friday 8:30am-5:00pm.  BELOW ARE SYMPTOMS THAT SHOULD BE REPORTED IMMEDIATELY:  *FEVER GREATER THAN 101.0 F  *CHILLS WITH OR WITHOUT FEVER  NAUSEA AND VOMITING THAT IS NOT CONTROLLED WITH YOUR NAUSEA MEDICATION  *UNUSUAL SHORTNESS OF BREATH  *UNUSUAL BRUISING OR BLEEDING  TENDERNESS IN MOUTH AND THROAT WITH OR WITHOUT PRESENCE OF ULCERS  *URINARY PROBLEMS  *BOWEL PROBLEMS  UNUSUAL RASH Items with * indicate a potential emergency and should be followed up as soon as possible. If you have an emergency after office hours please contact your primary care physician or go to the nearest emergency department.  Please call the clinic during office hours if you have any questions or concerns.   You may also contact the Patient Navigator at [phone] should you have any questions or need assistance in obtaining follow up care.      Resources For Cancer Patients and their Caregivers ? American Cancer Society: Can assist with transportation, wigs, general needs, runs Look Good Feel Better.        9722665193 ? Cancer Care: Provides financial assistance, online support groups, medication/co-pay assistance.   1-800-813-HOPE (670)482-2724) ? [place] Assists [place] cancer patients and their families through emotional , educational and financial support.  978-035-5682 ? [place] DSS Where to apply for food stamps, Medicaid and utility assistance. 669-574-9833 ? [facility]: Transportation to medical appointments. (747)729-4442 ? Social Security Administration: May apply for disability if have a Stage IV cancer. 503 242 4439 (316) 062-8261 ? [employer], Disability and Transit Services: Assists with nutrition, care and transit needs. (863)667-6061

## 2020-05-04 NOTE — Patient Instructions (Signed)
[facility] Discharge Instructions  You were seen today by Dr. Delton Coombes. He went over your recent results. You received your treatment today. You will also be scheduled for 2 iron infusions given 1 week apart. Dr. Delton Coombes will see you back in 4 weeks for labs and follow up.   Thank you for choosing [place] at [facility] to provide your oncology and hematology care.  To afford each patient quality time with our provider, please arrive at least 15 minutes before your scheduled appointment time.   If you have a lab appointment with the [facility] please come in thru the Main Entrance and check in at the main information desk  You need to re-schedule your appointment should you arrive 10 or more minutes late.  We strive to give you quality time with our providers, and arriving late affects you and other patients whose appointments are after yours.  Also, if you no show three or more times for appointments you may be dismissed from the clinic at the providers discretion.     Again, thank you for choosing [facility].  Our hope is that these requests will decrease the amount of time that you wait before being seen by our physicians.       _____________________________________________________________  Should you have questions after your visit to [facility], please contact our office at [phone] between the hours of 8:00 a.m. and 4:30 p.m.  Voicemails left after 4:00 p.m. will not be returned until the following business day.  For prescription refill requests, have your pharmacy contact our office and allow 72 hours.    Cancer Center Support Programs:   > Cancer Support Group  2nd Tuesday of the month 1pm-2pm, Journey Room

## 2020-05-05 LAB — PROTEIN ELECTROPHORESIS, SERUM
A/G Ratio: 1.2 (ref 0.7–1.7)
Albumin ELP: 2.8 g/dL — ABNORMAL LOW (ref 2.9–4.4)
Alpha-1-Globulin: 0.3 g/dL (ref 0.0–0.4)
Alpha-2-Globulin: 0.8 g/dL (ref 0.4–1.0)
Beta Globulin: 0.7 g/dL (ref 0.7–1.3)
Gamma Globulin: 0.6 g/dL (ref 0.4–1.8)
Globulin, Total: 2.4 g/dL (ref 2.2–3.9)
M-Spike, %: 0.5 g/dL — ABNORMAL HIGH
Total Protein ELP: 5.2 g/dL — ABNORMAL LOW (ref 6.0–8.5)

## 2020-05-05 LAB — KAPPA/LAMBDA LIGHT CHAINS
Kappa free light chain: 284 mg/L — ABNORMAL HIGH (ref 3.3–19.4)
Kappa, lambda light chain ratio: 35.06 — ABNORMAL HIGH (ref 0.26–1.65)
Lambda free light chains: 8.1 mg/L (ref 5.7–26.3)

## 2020-05-07 ENCOUNTER — Inpatient Hospital Stay (HOSPITAL_COMMUNITY): Payer: Medicare Other

## 2020-05-07 ENCOUNTER — Other Ambulatory Visit: Payer: Self-pay

## 2020-05-07 ENCOUNTER — Encounter (HOSPITAL_COMMUNITY): Payer: Self-pay

## 2020-05-07 VITALS — BP 116/44 | HR 81 | Temp 97.3°F | Resp 18

## 2020-05-07 DIAGNOSIS — D638 Anemia in other chronic diseases classified elsewhere: Secondary | ICD-10-CM

## 2020-05-07 DIAGNOSIS — E538 Deficiency of other specified B group vitamins: Secondary | ICD-10-CM

## 2020-05-07 DIAGNOSIS — Z5112 Encounter for antineoplastic immunotherapy: Secondary | ICD-10-CM | POA: Diagnosis not present

## 2020-05-07 DIAGNOSIS — D509 Iron deficiency anemia, unspecified: Secondary | ICD-10-CM

## 2020-05-07 MED ORDER — DARBEPOETIN ALFA 300 MCG/0.6ML IJ SOSY
300.0000 ug | PREFILLED_SYRINGE | Freq: Once | INTRAMUSCULAR | Status: AC
  Administered 2020-05-07: 300 ug via SUBCUTANEOUS
  Filled 2020-05-07: qty 0.6

## 2020-05-07 NOTE — Patient Instructions (Signed)
[facility] at [facility] Discharge Instructions  Received Aranesp injection today. Follow-up as scheduled   Thank you for choosing [facility] at [facility] to provide your oncology and hematology care.  To afford each patient quality time with our provider, please arrive at least 15 minutes before your scheduled appointment time.   If you have a lab appointment with the Cancer Center please come in thru the Main Entrance and check in at the main information desk.  You need to re-schedule your appointment should you arrive 10 or more minutes late.  We strive to give you quality time with our providers, and arriving late affects you and other patients whose appointments are after yours.  Also, if you no show three or more times for appointments you may be dismissed from the clinic at the providers discretion.     Again, thank you for choosing [facility].  Our hope is that these requests will decrease the amount of time that you wait before being seen by our physicians.       _____________________________________________________________  Should you have questions after your visit to [facility], please contact our office at [phone] and follow the prompts.  Our office hours are 8:00 a.m. and 4:30 p.m. Monday - Friday.  Please note that voicemails left after 4:00 p.m. may not be returned until the following business day.  We are closed weekends and major holidays.  You do have access to a nurse 24-7, just call the main number to the clinic [phone] and do not press any options, hold on the line and a nurse will answer the phone.    For prescription refill requests, have your pharmacy contact our office and allow 72 hours.    Due to Covid, you will need to wear a mask upon entering the hospital. If you do not have a mask, a mask will be given to you at the Main Entrance upon arrival. For doctor visits, patients may have  1 support person age 18 or older with them. For treatment visits, patients can not have anyone with them due to social distancing guidelines and our immunocompromised population.     

## 2020-05-07 NOTE — Progress Notes (Signed)
Carol Bradley tolerated Aranesp injection well without complaints or incident. Hgb 9.6 VSS Pt discharged self ambulatory in satisfactory condition

## 2020-05-08 ENCOUNTER — Other Ambulatory Visit (HOSPITAL_COMMUNITY): Payer: Self-pay | Admitting: Hematology

## 2020-05-10 ENCOUNTER — Other Ambulatory Visit (HOSPITAL_COMMUNITY): Payer: Self-pay | Admitting: Internal Medicine

## 2020-05-10 ENCOUNTER — Other Ambulatory Visit (HOSPITAL_COMMUNITY): Payer: Self-pay

## 2020-05-10 ENCOUNTER — Other Ambulatory Visit: Payer: Self-pay

## 2020-05-10 ENCOUNTER — Ambulatory Visit (HOSPITAL_COMMUNITY)
Admission: RE | Admit: 2020-05-10 | Discharge: 2020-05-10 | Disposition: A | Discharge status: Home / Self Care | Payer: Medicare Other | Source: Ambulatory Visit | Attending: Internal Medicine | Admitting: Internal Medicine

## 2020-05-10 DIAGNOSIS — R051 Acute cough: Secondary | ICD-10-CM | POA: Diagnosis present

## 2020-05-10 DIAGNOSIS — C9000 Multiple myeloma not having achieved remission: Secondary | ICD-10-CM

## 2020-05-10 MED ORDER — LENALIDOMIDE 10 MG PO CAPS: ORAL_CAPSULE | ORAL | 10 refills | Status: DC

## 2020-05-10 NOTE — Telephone Encounter (Signed)
Chart reviewed. Revlimid refilled per Dr. Delton Coombes.

## 2020-05-11 ENCOUNTER — Encounter (HOSPITAL_COMMUNITY): Payer: Self-pay

## 2020-05-11 ENCOUNTER — Inpatient Hospital Stay (HOSPITAL_COMMUNITY): Payer: Medicare Other | Attending: Hematology

## 2020-05-11 VITALS — BP 92/58 | HR 85 | Temp 97.2°F | Resp 17

## 2020-05-11 DIAGNOSIS — I130 Hypertensive heart and chronic kidney disease with heart failure and stage 1 through stage 4 chronic kidney disease, or unspecified chronic kidney disease: Secondary | ICD-10-CM | POA: Diagnosis not present

## 2020-05-11 DIAGNOSIS — G479 Sleep disorder, unspecified: Secondary | ICD-10-CM | POA: Diagnosis not present

## 2020-05-11 DIAGNOSIS — Z86718 Personal history of other venous thrombosis and embolism: Secondary | ICD-10-CM | POA: Insufficient documentation

## 2020-05-11 DIAGNOSIS — Z7952 Long term (current) use of systemic steroids: Secondary | ICD-10-CM | POA: Diagnosis not present

## 2020-05-11 DIAGNOSIS — J449 Chronic obstructive pulmonary disease, unspecified: Secondary | ICD-10-CM | POA: Diagnosis not present

## 2020-05-11 DIAGNOSIS — R634 Abnormal weight loss: Secondary | ICD-10-CM | POA: Diagnosis not present

## 2020-05-11 DIAGNOSIS — D638 Anemia in other chronic diseases classified elsewhere: Secondary | ICD-10-CM

## 2020-05-11 DIAGNOSIS — R7989 Other specified abnormal findings of blood chemistry: Secondary | ICD-10-CM | POA: Diagnosis not present

## 2020-05-11 DIAGNOSIS — Z87891 Personal history of nicotine dependence: Secondary | ICD-10-CM | POA: Diagnosis not present

## 2020-05-11 DIAGNOSIS — Z79899 Other long term (current) drug therapy: Secondary | ICD-10-CM | POA: Diagnosis not present

## 2020-05-11 DIAGNOSIS — I2510 Atherosclerotic heart disease of native coronary artery without angina pectoris: Secondary | ICD-10-CM | POA: Insufficient documentation

## 2020-05-11 DIAGNOSIS — R5383 Other fatigue: Secondary | ICD-10-CM | POA: Diagnosis not present

## 2020-05-11 DIAGNOSIS — R7401 Elevation of levels of liver transaminase levels: Secondary | ICD-10-CM | POA: Diagnosis not present

## 2020-05-11 DIAGNOSIS — D631 Anemia in chronic kidney disease: Secondary | ICD-10-CM | POA: Insufficient documentation

## 2020-05-11 DIAGNOSIS — R0602 Shortness of breath: Secondary | ICD-10-CM | POA: Diagnosis not present

## 2020-05-11 DIAGNOSIS — D509 Iron deficiency anemia, unspecified: Secondary | ICD-10-CM | POA: Insufficient documentation

## 2020-05-11 DIAGNOSIS — E538 Deficiency of other specified B group vitamins: Secondary | ICD-10-CM

## 2020-05-11 DIAGNOSIS — E785 Hyperlipidemia, unspecified: Secondary | ICD-10-CM | POA: Diagnosis not present

## 2020-05-11 DIAGNOSIS — Z9221 Personal history of antineoplastic chemotherapy: Secondary | ICD-10-CM | POA: Insufficient documentation

## 2020-05-11 DIAGNOSIS — Z7982 Long term (current) use of aspirin: Secondary | ICD-10-CM | POA: Diagnosis not present

## 2020-05-11 DIAGNOSIS — C9000 Multiple myeloma not having achieved remission: Secondary | ICD-10-CM | POA: Diagnosis present

## 2020-05-11 MED ORDER — SODIUM CHLORIDE 0.9 % IV SOLN
510.0000 mg | Freq: Once | INTRAVENOUS | Status: AC
  Administered 2020-05-11: 510 mg via INTRAVENOUS
  Filled 2020-05-11: qty 510

## 2020-05-11 MED ORDER — SODIUM CHLORIDE 0.9 % IV SOLN: INTRAVENOUS | Status: DC

## 2020-05-11 NOTE — Progress Notes (Signed)
Tolerated infusion w/o adverse reaction.  Alert, in no distress.  VSS.  Discharged via wheelchair in stable condition in c/o son.  

## 2020-05-11 NOTE — Patient Instructions (Signed)
[facility] at [facility] Discharge Instructions  Received Feraheme infusion today. Follow-up as scheduled   Thank you for choosing [facility] at [facility] to provide your oncology and hematology care.  To afford each patient quality time with our provider, please arrive at least 15 minutes before your scheduled appointment time.   If you have a lab appointment with the Cancer Center please come in thru the Main Entrance and check in at the main information desk.  You need to re-schedule your appointment should you arrive 10 or more minutes late.  We strive to give you quality time with our providers, and arriving late affects you and other patients whose appointments are after yours.  Also, if you no show three or more times for appointments you may be dismissed from the clinic at the providers discretion.     Again, thank you for choosing [facility].  Our hope is that these requests will decrease the amount of time that you wait before being seen by our physicians.       _____________________________________________________________  Should you have questions after your visit to [facility], please contact our office at [phone] and follow the prompts.  Our office hours are 8:00 a.m. and 4:30 p.m. Monday - Friday.  Please note that voicemails left after 4:00 p.m. may not be returned until the following business day.  We are closed weekends and major holidays.  You do have access to a nurse 24-7, just call the main number to the clinic [phone] and do not press any options, hold on the line and a nurse will answer the phone.    For prescription refill requests, have your pharmacy contact our office and allow 72 hours.    Due to Covid, you will need to wear a mask upon entering the hospital. If you do not have a mask, a mask will be given to you at the Main Entrance upon arrival. For doctor visits, patients may have  1 support person age 18 or older with them. For treatment visits, patients can not have anyone with them due to social distancing guidelines and our immunocompromised population.     

## 2020-05-13 ENCOUNTER — Other Ambulatory Visit (HOSPITAL_COMMUNITY): Payer: Self-pay

## 2020-05-13 DIAGNOSIS — C9000 Multiple myeloma not having achieved remission: Secondary | ICD-10-CM

## 2020-05-13 MED ORDER — DEXAMETHASONE 2 MG PO TABS: ORAL_TABLET | ORAL | 3 refills | Status: DC

## 2020-05-16 IMAGING — DX DG CHEST 2V
2 series · 2 of 2 positions shown · non-contrast
Comparison: 09/01/2019

CLINICAL DATA: Low oxygen saturation

EXAM:
CHEST - 2 VIEW

[chest lat]
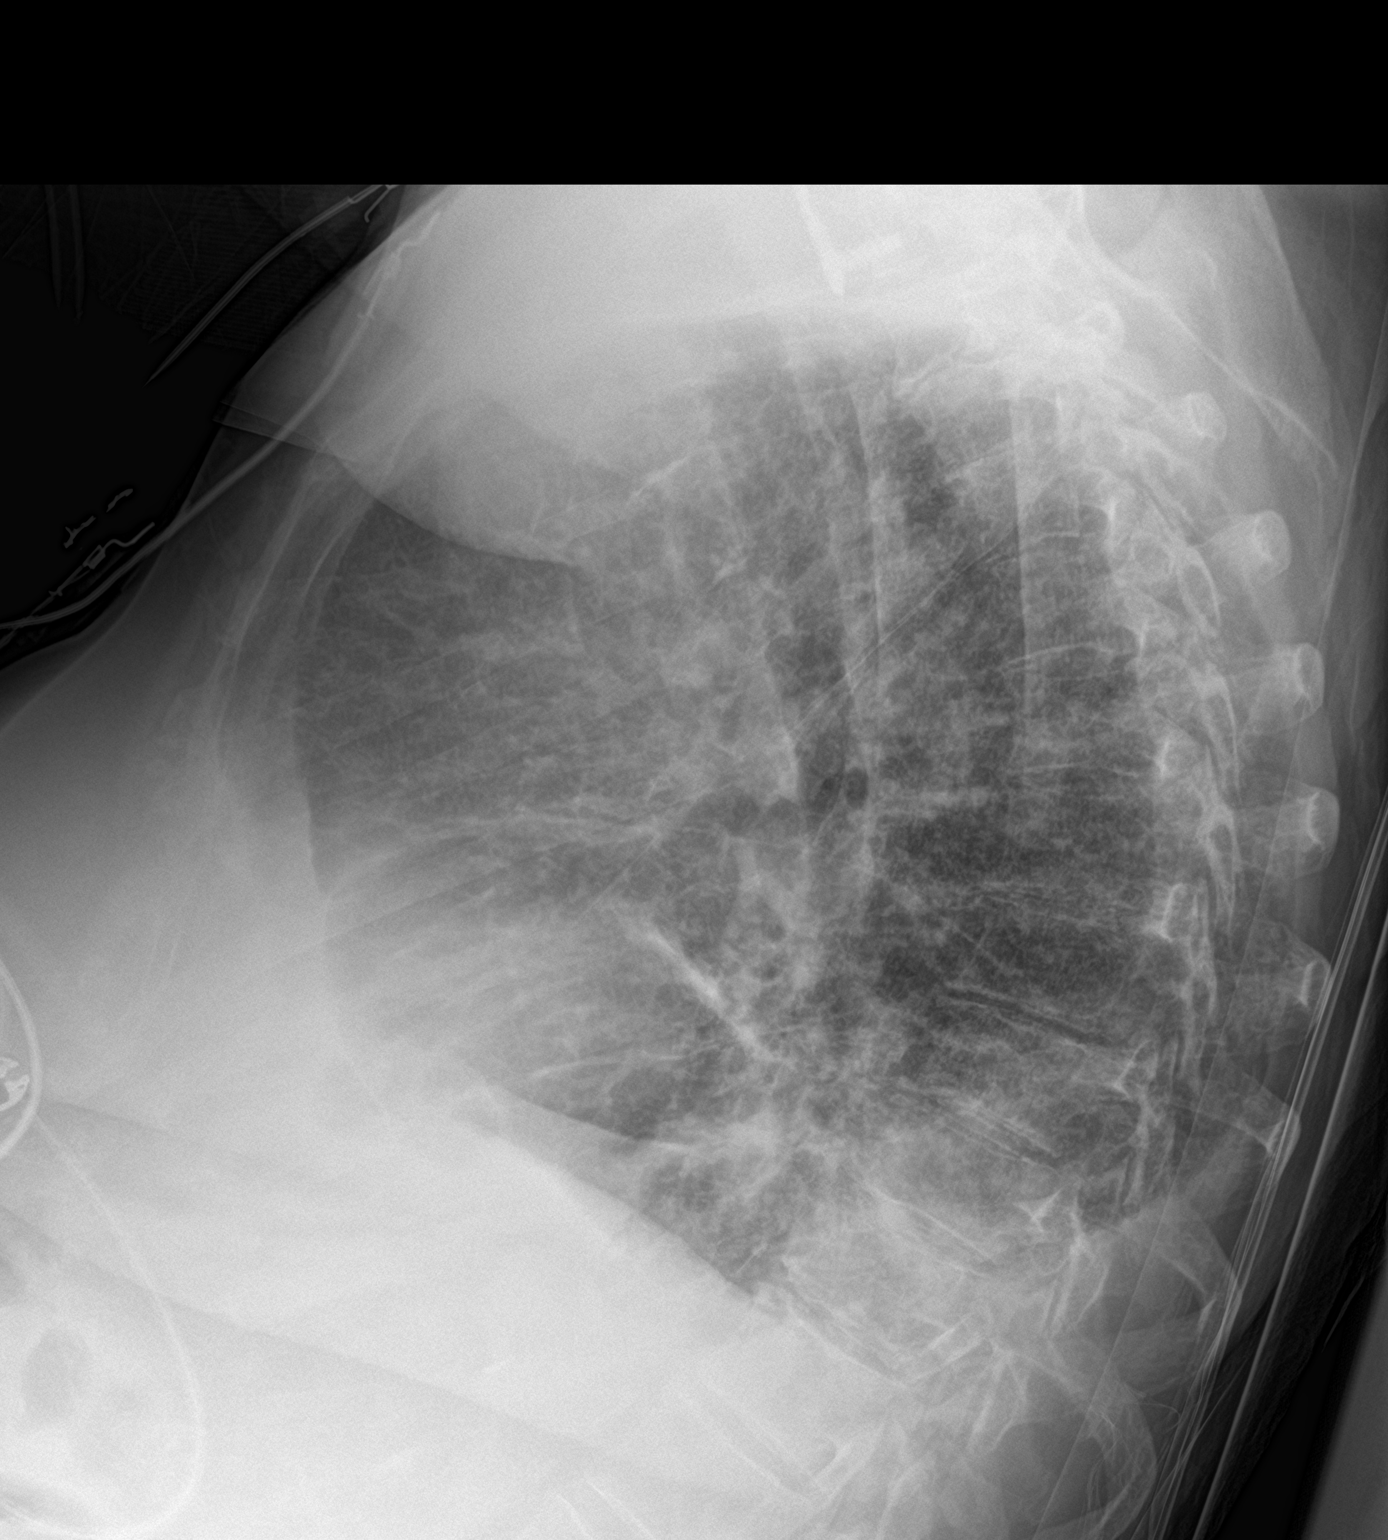

[chest ap]
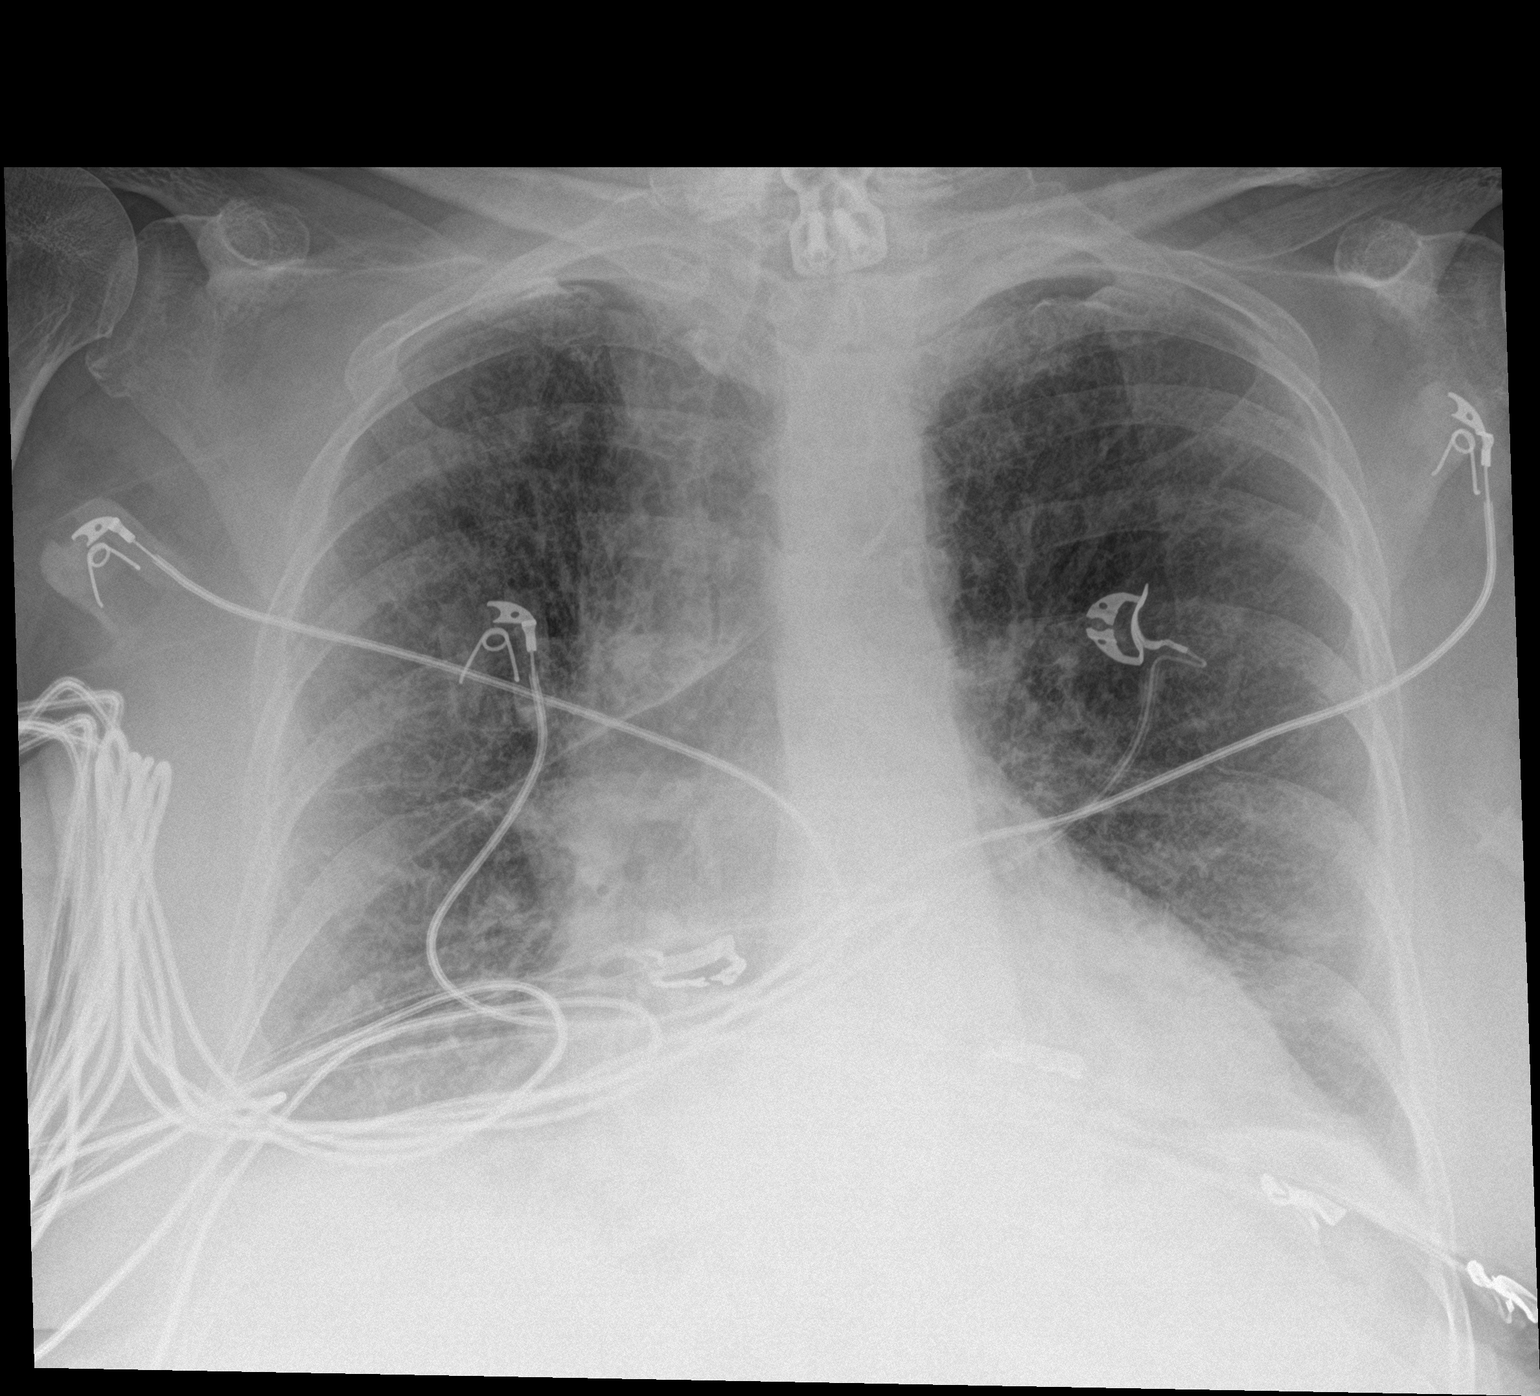

[2 of 2 positions shown; findings below may reference images not displayed]

FINDINGS: Bilateral diffuse interstitial thickening. No focal consolidation,
pleural effusion or pneumothorax. Stable cardiomediastinal
silhouette. No aggressive osseous lesion.
IMPRESSION: Bilateral diffuse interstitial thickening which may reflect
interstitial edema versus multilobar pneumonia.

## 2020-06-01 ENCOUNTER — Emergency Department (HOSPITAL_COMMUNITY): Payer: Medicare Other

## 2020-06-01 ENCOUNTER — Inpatient Hospital Stay (HOSPITAL_BASED_OUTPATIENT_CLINIC_OR_DEPARTMENT_OTHER): Payer: Medicare Other | Admitting: Hematology

## 2020-06-01 ENCOUNTER — Emergency Department (HOSPITAL_COMMUNITY)
Admission: EM | Admit: 2020-06-01 | Discharge: 2020-06-01 | Disposition: A | Discharge status: Home / Self Care | Payer: Medicare Other | Attending: Emergency Medicine | Admitting: Emergency Medicine

## 2020-06-01 ENCOUNTER — Other Ambulatory Visit: Payer: Self-pay

## 2020-06-01 ENCOUNTER — Inpatient Hospital Stay (HOSPITAL_COMMUNITY): Payer: Medicare Other

## 2020-06-01 ENCOUNTER — Encounter (HOSPITAL_COMMUNITY): Payer: Self-pay | Admitting: Emergency Medicine

## 2020-06-01 VITALS — BP 106/67 | HR 79 | Temp 96.7°F | Resp 18 | Wt 157.5 lb

## 2020-06-01 DIAGNOSIS — Z7951 Long term (current) use of inhaled steroids: Secondary | ICD-10-CM | POA: Insufficient documentation

## 2020-06-01 DIAGNOSIS — I5032 Chronic diastolic (congestive) heart failure: Secondary | ICD-10-CM | POA: Diagnosis not present

## 2020-06-01 DIAGNOSIS — C9000 Multiple myeloma not having achieved remission: Secondary | ICD-10-CM

## 2020-06-01 DIAGNOSIS — Z7982 Long term (current) use of aspirin: Secondary | ICD-10-CM | POA: Insufficient documentation

## 2020-06-01 DIAGNOSIS — Z955 Presence of coronary angioplasty implant and graft: Secondary | ICD-10-CM | POA: Insufficient documentation

## 2020-06-01 DIAGNOSIS — J449 Chronic obstructive pulmonary disease, unspecified: Secondary | ICD-10-CM | POA: Diagnosis not present

## 2020-06-01 DIAGNOSIS — Z79899 Other long term (current) drug therapy: Secondary | ICD-10-CM | POA: Insufficient documentation

## 2020-06-01 DIAGNOSIS — D509 Iron deficiency anemia, unspecified: Secondary | ICD-10-CM | POA: Diagnosis not present

## 2020-06-01 DIAGNOSIS — N1832 Chronic kidney disease, stage 3b: Secondary | ICD-10-CM | POA: Insufficient documentation

## 2020-06-01 DIAGNOSIS — R0602 Shortness of breath: Secondary | ICD-10-CM | POA: Insufficient documentation

## 2020-06-01 DIAGNOSIS — I130 Hypertensive heart and chronic kidney disease with heart failure and stage 1 through stage 4 chronic kidney disease, or unspecified chronic kidney disease: Secondary | ICD-10-CM | POA: Diagnosis not present

## 2020-06-01 DIAGNOSIS — I2510 Atherosclerotic heart disease of native coronary artery without angina pectoris: Secondary | ICD-10-CM | POA: Insufficient documentation

## 2020-06-01 LAB — CBC WITH DIFFERENTIAL/PLATELET
Abs Immature Granulocytes: 0.03 10*3/uL (ref 0.00–0.07)
Basophils Absolute: 0.1 10*3/uL (ref 0.0–0.1)
Basophils Relative: 1 %
Eosinophils Absolute: 0.3 10*3/uL (ref 0.0–0.5)
Eosinophils Relative: 5 %
HCT: 33.9 % — ABNORMAL LOW (ref 36.0–46.0)
Hemoglobin: 10.6 g/dL — ABNORMAL LOW (ref 12.0–15.0)
Immature Granulocytes: 1 %
Lymphocytes Relative: 13 %
Lymphs Abs: 0.7 10*3/uL (ref 0.7–4.0)
MCH: 33.4 pg (ref 26.0–34.0)
MCHC: 31.3 g/dL (ref 30.0–36.0)
MCV: 106.9 fL — ABNORMAL HIGH (ref 80.0–100.0)
Monocytes Absolute: 0.6 10*3/uL (ref 0.1–1.0)
Monocytes Relative: 11 %
Neutro Abs: 4 10*3/uL (ref 1.7–7.7)
Neutrophils Relative %: 69 %
Platelets: 207 10*3/uL (ref 150–400)
RBC: 3.17 MIL/uL — ABNORMAL LOW (ref 3.87–5.11)
RDW: 19.4 % — ABNORMAL HIGH (ref 11.5–15.5)
WBC: 5.6 10*3/uL (ref 4.0–10.5)
nRBC: 0 % (ref 0.0–0.2)

## 2020-06-01 LAB — COMPREHENSIVE METABOLIC PANEL
ALT: 280 U/L — ABNORMAL HIGH (ref 0–44)
AST: 101 U/L — ABNORMAL HIGH (ref 15–41)
Albumin: 3.1 g/dL — ABNORMAL LOW (ref 3.5–5.0)
Alkaline Phosphatase: 183 U/L — ABNORMAL HIGH (ref 38–126)
Anion gap: 8 (ref 5–15)
BUN: 22 mg/dL (ref 8–23)
CO2: 27 mmol/L (ref 22–32)
Calcium: 8.9 mg/dL (ref 8.9–10.3)
Chloride: 105 mmol/L (ref 98–111)
Creatinine, Ser: 1.51 mg/dL — ABNORMAL HIGH (ref 0.44–1.00)
GFR, Estimated: 35 mL/min — ABNORMAL LOW (ref >60)
Glucose, Bld: 92 mg/dL (ref 70–99)
Potassium: 3.8 mmol/L (ref 3.5–5.1)
Sodium: 140 mmol/L (ref 135–145)
Total Bilirubin: 0.6 mg/dL (ref 0.3–1.2)
Total Protein: 6.2 g/dL — ABNORMAL LOW (ref 6.5–8.1)

## 2020-06-01 LAB — TROPONIN I (HIGH SENSITIVITY): Troponin I (High Sensitivity): 16 ng/L (ref <18)

## 2020-06-01 LAB — LACTATE DEHYDROGENASE: LDH: 151 U/L (ref 98–192)

## 2020-06-01 MED ORDER — IOHEXOL 350 MG/ML SOLN
100.0000 mL | Freq: Once | INTRAVENOUS | Status: AC | PRN
  Administered 2020-06-01: 75 mL via INTRAVENOUS

## 2020-06-01 NOTE — Progress Notes (Signed)
No treatment today. Patient has increased labor of breathing.  Dr. Delton Coombes has instructed her to stop her Revlimid.  We will send her to the ER for evaluation.    Report given to Nira Conn, RN ER charge.

## 2020-06-01 NOTE — ED Provider Notes (Signed)
[facility] EMERGENCY DEPARTMENT Provider Note   CSN: [id number] Arrival date & time: 06/01/20  1351     History Chief Complaint  Patient presents with  . Shortness of Breath    Carol Bradley is a 79 y.o. female.  The history is provided by the patient. No language interpreter was used.  Shortness of Breath Severity:  Moderate Onset quality:  Sudden Timing:  Constant Progression:  Worsening Chronicity:  New Context comment:  Shortness of breath Relieved by:  Nothing Worsened by:  Nothing Ineffective treatments:  None tried Associated symptoms: no sore throat    Pt complains of feeling short of breath.  Pt has multiple myeloma.  Pt reports she has had increasing shortness of breath over the last week.  Pt saw Oncology today and was advised to come here for a ct scan to evaluate for PE.  Pt reports she was tole that one of the medications she is on can cause blood clots.     Past Medical History:  Diagnosis Date  . Anemia associated with stage 3 chronic renal failure ([place]) 04/13/2016  . CAD (coronary artery disease)   . COPD (chronic obstructive pulmonary disease) ([place])   . History of tobacco abuse   . Hyperlipidemia   . Hypertension   . 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 Multiple myeloma ([place])   . Vitamin B12 deficiency 04/15/2016   Overview:  Vitamin B12 level documented 155, January 2016 with the normal range being 211-924    Patient Active Problem List   Diagnosis Date Noted  . Gastric ulcer 11/25/2019  . Abnormal LFTs 11/25/2019  . H. pylori infection 11/25/2019  . Hemorrhagic shock ([place]) 09/11/2019  . Acute respiratory failure ([place])   . Gastrointestinal hemorrhage   . Hypotension   . Elevated troponin 09/05/2019  . Community acquired pneumonia 09/05/2019  . Iron deficiency anemia 07/10/2017  . Zonular dehiscence 01/03/2017  . Status post left cataract extraction 12/13/2016  . CAD (coronary artery disease) 12/01/2016  . Hypertension 12/01/2016  .  Hyperlipidemia 12/01/2016  . Goals of care, counseling/discussion 04/15/2016  . Anemia associated with stage 3 chronic renal failure ([place]) 04/13/2016  . 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 Chronic diastolic congestive heart failure ([place]) 12/13/2015  . Hypokalemia 11/02/2015  . History of heart disease 11/02/2015  . Emphysema/COPD ([place]) 10/12/2015  . 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 Incontinence of bowel 08/24/2015  . 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 Severe low back pain 05/10/2015  . Vitamin B12 deficiency 05/06/2015  . Multiple myeloma ([place]) 04/12/2015  . Cramps, extremity 04/01/2015  . Encounter for chemotherapy management 04/01/2015  . Hematoma of hip, right, sequela 04/01/2015  . Anemia of chronic disease 02/26/2015  . Anemia 02/12/2015  . Chronic renal disease, stage 3, moderately decreased glomerular filtration rate between 30-59 mL/min/1.73 square meter (HCC) 02/12/2015  . Elevated serum protein level 02/12/2015  . Hypoalbuminemia 02/12/2015  . Dyspnea on exertion 04/29/2014  . Dyspepsia 05/19/2011    Past Surgical History:  Procedure Laterality Date  . BACK SURGERY    . CARDIAC CATHETERIZATION  04/2011   right and left cath showing normal right heart pressures,but newly diagnosed coronary artery disease/drug eluting stent placed to RCA with residual disease in the proximal RCA and LAD and ramus, normal LV function and 60-65% EF  . COLONOSCOPY N/A 09/30/2014   Procedure: COLONOSCOPY;  Surgeon: Rogene Houston, MD;  Location: [facility];  Service: Endoscopy;  Laterality: N/A;  225  . CORONARY  ANGIOPLASTY WITH STENT PLACEMENT  04/2011   mid RCA: 3.0 X38 mm Promus DES. Residual 40% disease proximally  . ESOPHAGOGASTRODUODENOSCOPY N/A 09/10/2019   Procedure: ESOPHAGOGASTRODUODENOSCOPY (EGD);  Surgeon: Clarene Essex, MD;  Location: [place];  Service: Endoscopy;  Laterality: N/A;  . NECK SURGERY       OB History   No obstetric history on file.      Family History  Problem Relation Age of Onset  . Heart failure Mother   . Cancer Father     Social History   Tobacco Use  . Smoking status: Former Smoker    Packs/day: 3.00    Years: 25.00    Pack years: 75.00    Types: Cigarettes    Quit date: 07/10/1994    Years since quitting: 25.9  . Smokeless tobacco: Never Used  Vaping Use  . Vaping Use: Never used  Substance Use Topics  . Alcohol use: No  . Drug use: Never    Home Medications Prior to Admission medications   Medication Sig Start Date End Date Taking? Authorizing Provider  albuterol (VENTOLIN HFA) 108 (90 Base) MCG/ACT inhaler Inhale 2 puffs into the lungs every 6 (six) hours as needed for wheezing or shortness of breath. 07/23/19   Derek Jack, MD  aspirin 325 MG tablet Take 325 mg by mouth at bedtime.    [provider]  atorvastatin (LIPITOR) 10 MG tablet TAKE ONE TABLET BY MOUTH DAILY 04/14/20   Kroeger, Lorelee Cover., PA-C  Calcium Carbonate-Vit D-Min (CALCIUM 600+D PLUS MINERALS) 600-400 MG-UNIT CHEW Chew 1 tablet by mouth 3 (three) times daily.     [provider]  cyanocobalamin (,VITAMIN B-12,) 1000 MCG/ML injection Inject 1,000 mcg into the skin every 30 (thirty) days.     [provider]  daratumumab-hyaluronidase-fihj (DARZALEX FASPRO) 1800-30000 MG-UT/15ML SOLN Inject 1,800 mg into the skin once.    [provider]  dexamethasone (DECADRON) 2 MG tablet Take 5 tablets once weekly. 05/13/20   Derek Jack, MD  diazepam (VALIUM) 5 MG tablet TAKE ONE TABLET BY MOUTH EVERY 6 HOURS AS NEEDED FOR ANXIETY 12/01/19   Derek Jack, MD  diltiazem (CARDIZEM CD) 240 MG 24 hr capsule TAKE ONE CAPSULE BY MOUTH EVERY DAY 12/29/19   [place], Peter M, MD  famotidine (PEPCID) 20 MG tablet Take 20 mg by mouth 2 (two) times daily as needed.  12/01/19   [provider]  fluticasone (FLONASE) 50 MCG/ACT nasal spray Place 2 sprays into both nostrils as needed.      [provider]  furosemide (LASIX) 40 MG tablet Take 40 mg by mouth daily as needed.  03/02/20   [provider]  ipratropium (ATROVENT) 0.06 % nasal spray SMARTSIG:2 Spray(s) Both Nares Twice Daily PRN 11/17/19   [provider]  lenalidomide (REVLIMID) 10 MG capsule Take 1 capsule (10 mg total) by mouth daily as directed. 05/10/20   Derek Jack, MD  levofloxacin (LEVAQUIN) 500 MG tablet Take 500 mg by mouth daily. 05/10/20   [provider]  magnesium oxide (MAG-OX) 400 MG tablet TAKE ONE TABLET BY MOUTH TWICE DAILY. WHEN TAKING LASIX 05/08/20   Derek Jack, MD  metoprolol tartrate (LOPRESSOR) 25 MG tablet TAKE 1 AND 1/2 TABLETS BY MOUTH TWICE DAILY 03/26/20   [place], Peter M, MD  ondansetron (ZOFRAN-ODT) 4 MG disintegrating tablet Take 4 mg by mouth every 6 (six) hours as needed. 05/10/20   [provider]  pantoprazole (PROTONIX) 40 MG tablet Take 1 tablet (40 mg  total) by mouth 2 (two) times daily. 09/15/19   Domenic Polite, MD  polyethylene glycol powder [facility]) 17 GM/SCOOP powder Take by mouth as needed.     [provider]  potassium chloride (KLOR-CON) 10 MEQ tablet TAKE ONE TABLET BY MOUTH THREE TIMES DAILY. 11/27/19   Lockamy, Randi L, NP-C  tetracycline (SUMYCIN) 500 MG capsule Take 500 mg by mouth 2 (two) times daily.  12/02/19   [provider]  umeclidinium-vilanterol (ANORO ELLIPTA) 62.5-25 MCG/INH AEPB Inhale 1 puff into the lungs daily. 03/25/19   Derek Jack, MD    Allergies    Lisinopril and Plavix [clopidogrel bisulfate]  Review of Systems   Review of Systems  HENT: Negative for sore throat.   Respiratory: Positive for shortness of breath.     Physical Exam Updated Vital Signs BP (!) 119/53   Pulse 81   Temp 98.6 F (37 C) (Oral)   Resp (!) 28   Ht $R'5\' 7"'Yg$  (1.702 m)   Wt 71.4 kg   SpO2 92%   BMI 24.65 kg/m   Physical Exam Vitals and nursing note reviewed.   Constitutional:      Appearance: She is well-developed.  HENT:     Head: Normocephalic.     Mouth/Throat:     Mouth: Mucous membranes are moist.  Cardiovascular:     Rate and Rhythm: Normal rate.  Pulmonary:     Effort: Pulmonary effort is normal.     Breath sounds: Decreased breath sounds present.  Chest:     Chest wall: No mass.  Abdominal:     General: There is no distension.  Musculoskeletal:        General: Normal range of motion.     Cervical back: Normal range of motion.  Skin:    General: Skin is warm.  Neurological:     General: No focal deficit present.     Mental Status: She is alert and oriented to person, place, and time.  Psychiatric:        Mood and Affect: Mood normal.     ED Results / Procedures / Treatments   Labs (all labs ordered are listed, but only abnormal results are displayed) Labs Reviewed  TROPONIN I (HIGH SENSITIVITY)  TROPONIN I (HIGH SENSITIVITY)    EKG None  Radiology CT Angio Chest PE W and/or Wo Contrast  Result Date: 06/01/2020 CLINICAL DATA:  h/o multiple myeloma, having increased shortness of breath, PE suspected. EXAM: CT ANGIOGRAPHY CHEST WITH CONTRAST TECHNIQUE: Multidetector CT imaging of the chest was performed using the standard protocol during bolus administration of intravenous contrast. Multiplanar CT image reconstructions and MIPs were obtained to evaluate the vascular anatomy. CONTRAST:  77mL OMNIPAQUE IOHEXOL 350 MG/ML SOLN COMPARISON:  CT abdomen pelvis 10/07/2014. CT angio neck 01/02/2020 FINDINGS: Cardiovascular: Satisfactory opacification of the pulmonary arteries to the segmental level. No evidence of pulmonary embolism. The main pulmonary artery measures at the upper limits of normal. Normal heart size. No pericardial effusion. The thoracic aorta is normal in caliber. There is moderate calcified and noncalcified atherosclerotic plaque. Four-vessel coronary artery calcifications. Possible right coronary artery stent.  Mediastinum/Nodes: Several prominent but nonenlarged mediastinal lymph nodes are identified. No enlarged mediastinal, hilar, or axillary lymph nodes. Thyroid gland, trachea, and esophagus demonstrate no significant findings. Small hiatal hernia. Lungs/Pleura: At least moderate centrilobular emphysematous changes. Bilateral lower lobe subsegmental atelectasis. Linear scarring versus atelectasis of the lingula. Similar-appearing pulmonary micronodule within the right upper lobe (6:32). Several other scattered bilateral pulmonary micronodules are  again noted along the subpleural regions. No pulmonary mass. No focal consolidation. No pleural effusion. No pneumothorax. Upper Abdomen: Couple of partially visualized fluid density lesion within the left kidney likely represents simple renal cysts. No acute abnormality. Musculoskeletal: No chest wall abnormality. Partially visualized anterior cervical surgical hardware. Similar-appearing grade 1 anterolisthesis of C7 on T1. No suspicious lytic or blastic osseous lesions. No acute displaced fracture. Multilevel degenerative changes of the spine. Review of the MIP images confirms the above findings. IMPRESSION: 1. No pulmonary embolus. 2. No acute intrathoracic abnormality. 3. Small hiatal hernia. 4. Aortic Atherosclerosis (ICD10-I70.0) and Emphysema (ICD10-J43.9). Electronically Signed   By: Iven Finn M.D.   On: 06/01/2020 16:40    Procedures Procedures (including critical care time)  Medications Ordered in ED Medications  iohexol (OMNIPAQUE) 350 MG/ML injection 100 mL (75 mLs Intravenous Contrast Given 06/01/20 1549)    ED Course  I have reviewed the triage vital signs and the nursing notes.  Pertinent labs & imaging results that were available during my care of the patient were reviewed by me and considered in my medical decision making (see chart for details).    MDM Rules/Calculators/A&P                          MDM:  Ct angio no pe.  Troponin is  normal, EKG is normal   Pt advised to follow up with her primary care and her Oncologist Final Clinical Impression(s) / ED Diagnoses Final diagnoses:  SOB (shortness of breath)    Rx / DC Orders ED Discharge Orders    None    An After Visit Summary was printed and given to the patient.    Fransico Meadow, [place] 06/01/20 1736    Noemi Chapel, MD 06/05/20 [phone]

## 2020-06-01 NOTE — Progress Notes (Unsigned)
Labs reviewed liver enzymes have doubled since last visit, AST 101 and ALT 280.  Patient presents for treatment.  Vital signs within parameters.  Patient states that she is currently taking her Revilimd as prescribed and took her pretreatment medications prior to leaving home this afternoon.  Patient complains of shortness of breath with minimal exertion that has been on going for approximately two weeks.  Encouraged the patient to express this complaint to Dr. Delton Coombes during her appointment.  Patient expressed understanding.

## 2020-06-01 NOTE — ED Triage Notes (Signed)
Pt sent from the cancer center for further evalualtion of SHOB.

## 2020-06-01 NOTE — Discharge Instructions (Signed)
Y[facility] Ct scan and EKG are normal

## 2020-06-01 NOTE — Progress Notes (Signed)
Carol Bradley, [place] [address]   CLINIC:  Medical Oncology/Hematology  PCP:  [name], MD [address] / [place] [address] [phone]   REASON FOR VISIT:  Follow-up for multiple myeloma & IDA  PRIOR THERAPY: Darzalex & Pomalyst from 07/28/2019 to 08/25/2019  NGS Results: Not done  CURRENT THERAPY: Darzalex Faspro every 2 weeks; Revlimid 3 weeks on, 1 week off; intermittent Ferahame last on 05/11/2020  BRIEF ONCOLOGIC HISTORY:  Oncology History  Multiple myeloma ([facility])  02/26/2015 Initial Biopsy   BMBX 50% cellularity, igG kappa myeloma, IgG at 3600 mg/dl, FISH with monosomy of chromosome 13, gain 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    03/18/2015 - 07/28/2015 Chemotherapy   Velcade 1.6 mg/m2 discontinued secondary to intolerance   07/28/2015 Adverse Reaction   stool incontinence, weakness, collapse upon standing, felt to be secondary to velcade   11/09/2015 - 12/13/2015 Chemotherapy   cytoxan IV 300 mg/m2 administered X 2 doses only in addition to rev/dex   11/09/2015 - 06/08/2016 Chemotherapy   Revlimid/Dexamethasone    02/05/2017 - 07/09/2017 Chemotherapy   The patient had bortezomib SQ (VELCADE) chemo injection 1.75 mg, 1 mg/m2 = 1.75 mg (66.7 % of original dose 1.5 mg/m2), Subcutaneous,  Once, 1 of 8 cycles Dose modification: 1.5 mg/m2 (original dose 1.5 mg/m2, Cycle 1, Reason: Provider Judgment, Comment: Per Dr. Norma Fredrickson recommendations from [facility] 1 mg/m2 (original dose 1.5 mg/m2, Cycle 1, Reason: Provider Judgment)  for chemotherapy treatment.     07/09/2017 Adverse Reaction   Diarrhea    Chemotherapy   Pomalyst 2 mg (Days 1-21 every 28 days), Ixazomib 2.3 mg (days 1, 8, 15 every 28 days), and Dexamethasone 10 mg (weekly)- Rx's printed on 08/24/2017.  Treatment recommendations from Dr. Norma Fredrickson at [facility].   07/28/2019 -  Chemotherapy   The patient had dexamethasone (DECADRON) tablet 20 mg, 20 mg, Oral,  Once, 7 of 10 cycles Dose modification: 10 mg (original dose 10 mg, Cycle 1) Administration: 20 mg (07/28/2019), 10 mg (08/04/2019), 10 mg (08/11/2019), 10 mg (08/25/2019), 10 mg (12/09/2019), 10 mg (12/16/2019), 10 mg (12/23/2019), 10 mg (12/30/2019), 10 mg (01/06/2020), 10 mg (01/13/2020), 10 mg (03/23/2020) daratumumab-hyaluronidase-fihj (DARZALEX FASPRO) 1800-30000 MG-UT/15ML chemo SQ injection 1,800 mg, 1,800 mg, Subcutaneous,  Once, 7 of 10 cycles Administration: 1,800 mg (07/28/2019), 1,800 mg (08/04/2019), 1,800 mg (08/11/2019), 1,800 mg (08/25/2019), 1,800 mg (12/09/2019), 1,800 mg (12/16/2019), 1,800 mg (12/23/2019), 1,800 mg (12/30/2019), 1,800 mg (01/06/2020), 1,800 mg (01/13/2020), 1,800 mg (05/04/2020), 1,800 mg (02/10/2020), 1,800 mg (02/24/2020), 1,800 mg (01/20/2020), 1,800 mg (01/27/2020), 1,800 mg (03/09/2020), 1,800 mg (03/23/2020), 1,800 mg (04/06/2020), 1,800 mg (04/20/2020)  for chemotherapy treatment.      CANCER STAGING: Cancer Staging No matching staging information was found for the patient.  INTERVAL HISTORY:  [name], a 79 y.o. female, returns for routine follow-up and consideration for next cycle of immunotherapy. Hasana was last seen on 05/04/2020.  Due for cycle #8 of Darzalex Faspro today.   Overall, she tells me she has been feeling okay. She reports that her SOB has worsened for about 1.5 weeks where walking even a short distance in her room will give her SOB. She takes Lasix 40 mg every AM. She is currently taking Revlimid. She is taking ASA 325 mg at bedtime and denies having a history of DVT's or PE.  She will forego treatment today due to her worsening SOB over the past week and a-half.  REVIEW OF SYSTEMS:  Review of Systems  Constitutional: Positive for appetite change (50%) and fatigue (depleted).  Respiratory: Positive for shortness of breath (worsening).   Psychiatric/Behavioral: Positive for sleep disturbance.  All other systems reviewed and are negative.   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   Multiple myeloma ([facility])    Vitamin B12 deficiency 04/15/2016   Overview:  Vitamin B12 level documented 155, January 2016 with the normal range being 211-924   Past Surgical History:  Procedure Laterality Date   BACK SURGERY     CARDIAC CATHETERIZATION  04/2011   right and left cath showing normal right heart pressures,but newly diagnosed coronary artery disease/drug eluting stent placed to RCA with residual disease in the proximal RCA and LAD and ramus, normal LV function and 60-65% EF   COLONOSCOPY N/A 09/30/2014   Procedure: COLONOSCOPY;  Surgeon: Rogene Houston, MD;  [facility]: [facility];  Service: Endoscopy;  Laterality: N/A;  225   CORONARY ANGIOPLASTY WITH STENT PLACEMENT  04/2011   mid RCA: 3.0 X38 mm Promus DES. 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  Tobacco Use   Smoking status: Former Smoker    Packs/day: 3.00    Years: 25.00    Pack years: 75.00    Types: Cigarettes    Quit date: 07/10/1994    Years since quitting: 25.9   Smokeless tobacco: Never Used  Vaping Use   Vaping Use: Never used  Substance and Sexual Activity   Alcohol use:  No   Drug use: Never   Sexual activity: Not on file  Other Topics Concern   Not on file  Social History Narrative   Not on file   Social Determinants of Health   Financial Resource Strain: Low Risk    Difficulty of Paying Living Expenses: Not hard at all  Food Insecurity: No Food Insecurity   Worried About Charity fundraiser in the Last Year: Never true   [place] in the Last Year: Never true  Transportation Needs: No Transportation Needs   Lack of Transportation (Medical): No   Lack of Transportation (Non-Medical): No  Physical Activity: Inactive   Days of Exercise per Week: 0 days   Minutes of Exercise per Session: 0 min  Stress: No Stress Concern Present   Feeling of Stress : Not at all  Social Connections: Socially Isolated   Frequency of Communication with Friends and Family: More than three times a week   Frequency of Social Gatherings with Friends and Family: More than three times a week   Attends Religious Services: Never   Marine scientist or Organizations: No   Attends  Club or Organization Meetings: Never   Marital Status: Widowed  Human resources officer Violence: Not At Risk   Fear of Current or Ex-Partner: No   Emotionally Abused: No   Physically Abused: No   Sexually Abused: No    FAMILY HISTORY:  Family History  Problem Relation Age of Onset   Heart failure Mother    Cancer Father     CURRENT MEDICATIONS:  Current Outpatient Medications  Medication Sig Dispense Refill   albuterol (VENTOLIN HFA) 108 (90 Base) MCG/ACT inhaler Inhale 2 puffs into the lungs every 6 (six) hours as needed for wheezing or shortness of breath. 18 g 2   aspirin 325 MG tablet Take 325 mg by mouth at bedtime.     atorvastatin (LIPITOR) 10 MG tablet TAKE ONE TABLET BY MOUTH DAILY 90 tablet 1   Calcium Carbonate-Vit D-Min (CALCIUM 600+D PLUS MINERALS) 600-400 MG-UNIT CHEW Chew 1 tablet by mouth 3 (three) times daily.      cyanocobalamin  (,VITAMIN B-12,) 1000 MCG/ML injection Inject 1,000 mcg into the skin every 30 (thirty) days.      daratumumab-hyaluronidase-fihj (DARZALEX FASPRO) 1800-30000 MG-UT/15ML SOLN Inject 1,800 mg into the skin once.     dexamethasone (DECADRON) 2 MG tablet Take 5 tablets once weekly. 30 tablet 3   diazepam (VALIUM) 5 MG tablet TAKE ONE TABLET BY MOUTH EVERY 6 HOURS AS NEEDED FOR ANXIETY 30 tablet 1   diltiazem (CARDIZEM CD) 240 MG 24 hr capsule TAKE ONE CAPSULE BY MOUTH EVERY DAY 90 capsule 3   famotidine (PEPCID) 20 MG tablet Take 20 mg by mouth 2 (two) times daily as needed.      fluticasone (FLONASE) 50 MCG/ACT nasal spray Place 2 sprays into both nostrils as needed.      furosemide (LASIX) 40 MG tablet Take 40 mg by mouth daily as needed.      ipratropium (ATROVENT) 0.06 % nasal spray SMARTSIG:2 Spray(s) Both Nares Twice Daily PRN     lenalidomide (REVLIMID) 10 MG capsule Take 1 capsule (10 mg total) by mouth daily as directed. 21 capsule 10   levofloxacin (LEVAQUIN) 500 MG tablet Take 500 mg by mouth daily.     magnesium oxide (MAG-OX) 400 MG tablet TAKE ONE TABLET BY MOUTH TWICE DAILY. WHEN TAKING LASIX 60 tablet 2   metoprolol tartrate (LOPRESSOR) 25 MG tablet TAKE 1 AND 1/2 TABLETS BY MOUTH TWICE DAILY 75 tablet 3   ondansetron (ZOFRAN-ODT) 4 MG disintegrating tablet Take 4 mg by mouth every 6 (six) hours as needed.     pantoprazole (PROTONIX) 40 MG tablet Take 1 tablet (40 mg total) by mouth 2 (two) times daily. 60 tablet 0   polyethylene glycol powder (GLYCOLAX/MIRALAX) 17 GM/SCOOP powder Take by mouth as needed.      potassium chloride (KLOR-CON) 10 MEQ tablet TAKE ONE TABLET BY MOUTH THREE TIMES DAILY. 90 tablet 4   tetracycline (SUMYCIN) 500 MG capsule Take 500 mg by mouth 2 (two) times daily.      umeclidinium-vilanterol (ANORO ELLIPTA) 62.5-25 MCG/INH AEPB Inhale 1 puff into the lungs daily. 60 each 1   No current facility-administered medications for this visit.     ALLERGIES:  Allergies  Allergen Reactions   Lisinopril Cough   Plavix [Clopidogrel Bisulfate] Swelling     PHYSICAL EXAM:  Performance status (ECOG): 1 - Symptomatic but completely ambulatory  Vitals:   06/01/20 1244  BP: 106/67  Pulse: 79  Resp: 18  Temp: (!) 96.7 F (35.9 C)  SpO2: 96%  Wt Readings from Last 3 Encounters:  06/01/20 157 lb 8 oz (71.4 kg)  05/04/20 165 lb 8 oz (75.1 kg)  04/20/20 159 lb 6.4 oz (72.3 kg)   Physical Exam Vitals reviewed.  Constitutional:      Appearance: Normal appearance.  Cardiovascular:     Rate and Rhythm: Normal rate and regular rhythm.     Pulses: Normal pulses.     Heart sounds: Normal heart sounds.  Pulmonary:     Effort: Pulmonary effort is normal.     Breath sounds: Normal breath sounds.  Neurological:     General: No focal deficit present.     Mental Status: She is alert and oriented to person, place, and time.  Psychiatric:        Mood and Affect: Mood normal.        Behavior: Behavior normal.     LABORATORY DATA:  I have reviewed the labs as listed.  CBC Latest Ref Rng & Units 06/01/2020 05/04/2020 04/20/2020  WBC 4.0 - 10.5 K/uL 5.6 5.6 4.4  Hemoglobin 12.0 - 15.0 g/dL 10.6(L) 9.6(L) 10.6(L)  Hematocrit 36 - 46 % 33.9(L) 30.9(L) 32.9(L)  Platelets 150 - 400 K/uL 207 176 230   CMP Latest Ref Rng & Units 06/01/2020 05/04/2020 04/20/2020  Glucose 70 - 99 mg/dL 92 97 120(H)  BUN 8 - 23 mg/dL 22 28(H) 26(H)  Creatinine 0.44 - 1.00 mg/dL 1.51(H) 1.38(H) 1.67(H)  Sodium 135 - 145 mmol/L 140 137 137  Potassium 3.5 - 5.1 mmol/L 3.8 4.3 3.5  Chloride 98 - 111 mmol/L 105 108 101  CO2 22 - 32 mmol/L $RemoveB'27 23 24  'eUiYcagw$ Calcium 8.9 - 10.3 mg/dL 8.9 8.0(L) 8.2(L)  Total Protein 6.5 - 8.1 g/dL 6.2(L) 5.9(L) 6.1(L)  Total Bilirubin 0.3 - 1.2 mg/dL 0.6 1.0 0.9  Alkaline Phos 38 - 126 U/L 183(H) 90 91  AST 15 - 41 U/L 101(H) 50(H) 19  ALT 0 - 44 U/L 280(H) 57(H) 22   Lab Results  Component Value Date   LDH 151 06/01/2020    LDH 190 05/04/2020   LDH 145 04/06/2020   Lab Results  Component Value Date   TOTALPROTELP 5.2 (L) 05/04/2020   ALBUMINELP 2.8 (L) 05/04/2020   A1GS 0.3 05/04/2020   A2GS 0.8 05/04/2020   BETS 0.7 05/04/2020   GAMS 0.6 05/04/2020   MSPIKE 0.5 (H) 05/04/2020   SPEI Comment 05/04/2020    Lab Results  Component Value Date   KPAFRELGTCHN 284.0 (H) 05/04/2020   LAMBDASER 8.1 05/04/2020   KAPLAMBRATIO 35.06 (H) 05/04/2020    DIAGNOSTIC IMAGING:  I have independently reviewed the scans and discussed with the patient. DG Chest 2 View  Result Date: 05/11/2020 CLINICAL DATA:  Productive cough EXAM: CHEST - 2 VIEW COMPARISON:  None. FINDINGS: Anterior cervical fusion. Normal mediastinum and cardiac silhouette. Chronic central bronchitic markings. Normal pulmonary vasculature. No effusion, infiltrate, or pneumothorax. IMPRESSION: Chronic bronchitic markings.  No acute findings. Electronically Signed   By: Suzy Bouchard M.D.   On: 05/11/2020 08:06     ASSESSMENT:  1. 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 -Hospitalized with severe anemia, weakness and pneumonia and shortness of breath on 09/05/2019 through 09/15/2019. -Myeloma labs from 12/12/2019 shows M spike increased to 0.6 g. Kappa light chains increased to 93 and ratio increased to 10.96. -Darzalex single agent started back on 12/09/2019. Pomalidomide discontinued after hospitalization. -Revlimid 10 mg 3 weeks  on 1 week off started on 03/01/2020.  2. 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 -H. pylori antibody was positive. Dr. Laural Golden is starting her on antibiotics. -Ultrasound of the abdomen on 11/27/2019 showed increased echogenicity of hepatic parenchyma consistent with steatosis. Mild amount of sludge noted in the gallbladder. 10.3 cm bilobed cyst noted in the left  kidney.   PLAN:  1. IgG kappa multiple myeloma: -She is taking Revlimid 10 mg 3 weeks on 1 week off.  She is also taking dexamethasone 10 mg weekly. -She reported worsening shortness of breath on exertion for the last 1 week. -She is short of breath while sitting in the office. -Saturations are 96%, with respiratory rate 18-20/min. -I have recommended evaluation in the ER for pulmonary embolism and other lung abnormalities. -I have reviewed her labs.  AST was 101 and ALT is elevated 7 times upper limit of normal.  Total bilirubin is normal.  Alk phos is elevated. -Have recommended holding off on Revlimid at this time.  Will hold Darzalex. -Further follow-up will depend on the scan results.  I have reached out to ER physician Dr. Sabra Heck.  2. Weight loss: -She lost about 8 pounds in the last 1 month.  3. Myeloma bone disease: -Continue Zometa every 3 months.  4. CKD: -Creatinine elevated at 1.51.  She is taking Lasix as needed.  5. Normocytic anemia: -She has received Feraheme with improvement of hemoglobin to 10.6 from 9.6 a month ago.   Orders placed this encounter:  No orders of the defined types were placed in this encounter.    Derek Jack, MD [place] [phone]   I, Milinda Antis, am acting as a scribe for Dr. Sanda Linger.  I, Derek Jack MD, have reviewed the above documentation for accuracy and completeness, and I agree with the above.

## 2020-06-02 LAB — PROTEIN ELECTROPHORESIS, SERUM
A/G Ratio: 1 (ref 0.7–1.7)
Albumin ELP: 2.8 g/dL — ABNORMAL LOW (ref 2.9–4.4)
Alpha-1-Globulin: 0.3 g/dL (ref 0.0–0.4)
Alpha-2-Globulin: 0.9 g/dL (ref 0.4–1.0)
Beta Globulin: 0.8 g/dL (ref 0.7–1.3)
Gamma Globulin: 0.8 g/dL (ref 0.4–1.8)
Globulin, Total: 2.8 g/dL (ref 2.2–3.9)
M-Spike, %: 0.7 g/dL — ABNORMAL HIGH
Total Protein ELP: 5.6 g/dL — ABNORMAL LOW (ref 6.0–8.5)

## 2020-06-02 LAB — KAPPA/LAMBDA LIGHT CHAINS
Kappa free light chain: 435.4 mg/L — ABNORMAL HIGH (ref 3.3–19.4)
Kappa, lambda light chain ratio: 92.64 — ABNORMAL HIGH (ref 0.26–1.65)
Lambda free light chains: 4.7 mg/L — ABNORMAL LOW (ref 5.7–26.3)

## 2020-06-07 ENCOUNTER — Other Ambulatory Visit (HOSPITAL_COMMUNITY): Payer: Self-pay

## 2020-06-07 ENCOUNTER — Encounter (HOSPITAL_COMMUNITY): Payer: Self-pay

## 2020-06-07 DIAGNOSIS — C9000 Multiple myeloma not having achieved remission: Secondary | ICD-10-CM

## 2020-06-07 NOTE — Progress Notes (Signed)
Patient called to see how she was feeling following the patient's most recent office visit. The patient reports feeling well but that there is little to no improvement in her work of breathing. Dr. Delton Coombes made aware. Awaiting further instructions.

## 2020-06-07 NOTE — Progress Notes (Signed)
Cardiology Office Note   Date:  06/11/2020   ID:  Carol Bradley, DOB 05/02/1941, MRN [id number]  PCP:  [name], MD  Cardiologist:  Vibhav Waddill [place], MD EP: None  Chief Complaint  Patient presents with  . Shortness of Breath      History of Present Illness: Carol Bradley is a 79 y.o. female  with PMH of CAD s/p PCI/DES to RCA in 0626, chronic diastolic CHF, HTN, HLD, mitral regurgitation, pulmHTN, COPD, CKD stage 3, and multiple myeloma on maintenance chemotherapy, who presents for follow-up of her CHF  She was  evaluated by cardiology on  07/09/19, at which time she had a recent episode of acute on chronic diastolic CHF with increased lasix for a brief time. She was recommended to continue lasix $RemoveBefore'40mg'YhvTlxmjBvLkO$  daily with plans to take an additional lasix for weight gain of 3lbs in 1 day or 5lbs in 1 week. No other medication changes occurred.  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 He hospital course was further complicated by acute on chronic diastolic CHF requiring IV lasix, ultimately discharged home on lasix $Remove'20mg'BrbopFh$  daily ,as well as community acquired PNA managed with IV antibiotics. Echo that admission showed EF 55-60%, mild concentric LVH, G1DD, no RWMA, moderate MR, mild-moderate TR, and mild AI. Her last ischemic evaluation was a NST 12/2017 which showed no infarct or ischemia.   In May she had a severe HA and felt imbalance. She had an MRI that showed evidence of right MCA CVA. Seen by Dr Leonie Man. ASA dose increased. Referred to Dr Rayann Heman for consideration of ILR but she states she couldn't afford the monthly fee for monitoring.  Event monitor was benign with no evidence of Afib.   On follow up today she notes for the last 2-3 weeks increased SOB. Was seen in the ED on 11/23 and work up was unrevealing. Hgb was stable at 10.6. troponin was normal. Ecg was OK. CXR showed NAD with chronic  bronchitic changes. Subsequent CT chest showed moderate emphysema without acute infiltrate or edema. No PE. She was noted to have elevated LFTs. US showed changes of steatosis versus hepatocellular disease. He chemotherapy was stopped including Revlimid and Darzolex. She hasn't noted any improvement in SOB. No edema.     Past Medical History:  Diagnosis Date  . Anemia associated with stage 3 chronic renal failure ([place]) 04/13/2016  . CAD (coronary artery disease)   . COPD (chronic obstructive pulmonary disease) ([place])   . History of tobacco abuse   . Hyperlipidemia   . Hypertension   . 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 Multiple myeloma ([place])   . Vitamin B12 deficiency 04/15/2016   Overview:  Vitamin B12 level documented 155, January 2016 with the normal range being 211-924    Past Surgical History:  Procedure Laterality Date  . BACK SURGERY    . CARDIAC CATHETERIZATION  04/2011   right and left cath showing normal right heart pressures,but newly diagnosed coronary artery disease/drug eluting stent placed to RCA with residual disease in the proximal RCA and LAD and ramus, normal LV function and 60-65% EF  . COLONOSCOPY N/A 09/30/2014   Procedure: COLONOSCOPY;  Surgeon: Rogene Houston, MD;  Location: [facility];  Service: Endoscopy;  Laterality: N/A;  225  . CORONARY ANGIOPLASTY WITH STENT PLACEMENT  04/2011   mid RCA: 3.0 X38 mm Promus DES. Residual  40% disease proximally  . ESOPHAGOGASTRODUODENOSCOPY N/A 09/10/2019   Procedure: ESOPHAGOGASTRODUODENOSCOPY (EGD);  Surgeon: Clarene Essex, MD;  Location: [facility];  Service: Endoscopy;  Laterality: N/A;  . NECK SURGERY       Current Outpatient Medications  Medication Sig Dispense Refill  . albuterol (VENTOLIN HFA) 108 (90 Base) MCG/ACT inhaler Inhale 2 puffs into the lungs every 6 (six) hours as needed for wheezing or shortness of breath. 18 g 2  . aspirin 325 MG tablet Take 325 mg by mouth at bedtime.    . Calcium Carbonate-Vit  D-Min (CALCIUM 600+D PLUS MINERALS) 600-400 MG-UNIT CHEW Chew 1 tablet by mouth 3 (three) times daily.     . cyanocobalamin (,VITAMIN B-12,) 1000 MCG/ML injection Inject 1,000 mcg into the skin every 30 (thirty) days.     Marland Kitchen dexamethasone (DECADRON) 2 MG tablet Take 5 tablets once weekly. 30 tablet 3  . diltiazem (CARDIZEM CD) 240 MG 24 hr capsule TAKE ONE CAPSULE BY MOUTH EVERY DAY 90 capsule 3  . famotidine (PEPCID) 20 MG tablet Take 20 mg by mouth 2 (two) times daily as needed.     . fluticasone (FLONASE) 50 MCG/ACT nasal spray Place 2 sprays into both nostrils as needed.     . furosemide (LASIX) 40 MG tablet Take 40 mg by mouth daily as needed.     Marland Kitchen ipratropium (ATROVENT) 0.06 % nasal spray SMARTSIG:2 Spray(s) Both Nares Twice Daily PRN    . lenalidomide (REVLIMID) 10 MG capsule Take 1 capsule (10 mg total) by mouth daily as directed. 21 capsule 10  . magnesium oxide (MAG-OX) 400 MG tablet TAKE ONE TABLET BY MOUTH TWICE DAILY. WHEN TAKING LASIX 60 tablet 2  . metoprolol tartrate (LOPRESSOR) 25 MG tablet TAKE 1 AND 1/2 TABLETS BY MOUTH TWICE DAILY 75 tablet 3  . ondansetron (ZOFRAN-ODT) 4 MG disintegrating tablet Take 4 mg by mouth every 6 (six) hours as needed.     . pantoprazole (PROTONIX) 40 MG tablet Take 1 tablet (40 mg total) by mouth 2 (two) times daily. 60 tablet 0  . polyethylene glycol powder (GLYCOLAX/MIRALAX) 17 GM/SCOOP powder Take by mouth as needed.     . potassium chloride (KLOR-CON) 10 MEQ tablet TAKE ONE TABLET BY MOUTH THREE TIMES DAILY. 90 tablet 4  . umeclidinium-vilanterol (ANORO ELLIPTA) 62.5-25 MCG/INH AEPB Inhale 1 puff into the lungs daily. 60 each 1   No current facility-administered medications for this visit.    Allergies:   Lisinopril and Plavix [clopidogrel bisulfate]    Social History:  The patient  reports that she quit smoking about 25 years ago. Her smoking use included cigarettes. She has a 75.00 pack-year smoking history. She has never used smokeless  tobacco. She reports that she does not drink alcohol and does not use drugs.   Family History:  The patient's family history includes Cancer in her father; Heart failure in her mother.    ROS:  Please see the history of present illness.   Otherwise, review of systems are positive for none.   All other systems are reviewed and negative.    PHYSICAL EXAM: VS:  BP 123/71   Pulse 83   Temp (!) 96.8 F (36 C)   Ht $R'5\' 7"'Jp$  (1.702 m)   Wt 160 lb 9.6 oz (72.8 kg)   SpO2 97%   BMI 25.15 kg/m  , BMI Body mass index is 25.15 kg/m. GEN: Well nourished, well developed, in no acute distress HEENT: sclera anicteric Neck: no JVD, carotid bruits,  or masses Cardiac: RRR; no murmurs, rubs, or gallops, trace LE edema  Respiratory:  clear to auscultation bilaterally, decreased BS throughout. normal work of breathing GI: soft, nontender, nondistended, + BS MS: no deformity or atrophy Skin: warm and dry, no rash Neuro:  Strength and sensation are intact Psych: euthymic mood, full affect   EKG:  EKG is not ordered today.   Recent Labs: 09/05/2019: B Natriuretic Peptide 1,489.0 03/09/2020: Magnesium 2.6 06/08/2020: ALT 354; BUN 23; Creatinine, Ser 1.27; Hemoglobin 9.8; Platelets 167; Potassium 3.4; Sodium 140    Lipid Panel    Component Value Date/Time   CHOL 99 (L) 12/25/2019 1109   TRIG 95 12/25/2019 1109   HDL 42 12/25/2019 1109   CHOLHDL 2.4 12/25/2019 1109   CHOLHDL 3.0 12/24/2017 1322   VLDL 15 12/24/2017 1322   LDLCALC 39 12/25/2019 1109      Wt Readings from Last 3 Encounters:  06/11/20 160 lb 9.6 oz (72.8 kg)  06/08/20 162 lb 6.4 oz (73.7 kg)  06/01/20 157 lb 6.5 oz (71.4 kg)      Other studies Reviewed: Additional studies/ records that were reviewed today include:   Echocardiogram 08/2019: 1. Left ventricular ejection fraction, by estimation, is 55 to 60%. The  left ventricle has normal function. The left ventricle has no regional  wall motion abnormalities. There is  mild concentric left ventricular  hypertrophy. Left ventricular diastolic  parameters are consistent with Grade I diastolic dysfunction (impaired  relaxation).  2. Right ventricular systolic function is normal. The right ventricular  size is mildly enlarged. There is mildly elevated pulmonary artery  systolic pressure.  3. Right atrial size was mildly dilated.  4. The mitral valve is degenerative. Moderate mitral valve regurgitation.  5. Tricuspid valve regurgitation is mild to moderate.  6. The aortic valve is tricuspid. Aortic valve regurgitation is mild.  Mild aortic valve sclerosis is present, with no evidence of aortic valve  stenosis.  7. The inferior vena cava is normal in size with greater than 50%  respiratory variability, suggesting right atrial pressure of 3 mmHg.   Echocardiogram 01/2019: 1. The left ventricle has normal systolic function with an ejection fraction of 60-65%. The cavity size was normal. There is moderately increased left ventricular wall thickness. Left ventricular diastolic Doppler parameters are consistent with impaired relaxation. 2. Left atrial size was mildly dilated. 3. The mitral valve is abnormal. Mild calcification of the mitral valve leaflet. There is mild mitral annular calcification present. Mitral valve regurgitation is mild to moderate by color flow Doppler. 4. The aortic valve is tricuspid. Aortic valve regurgitation is trivial by color flow Doppler. No stenosis of the aortic valve. 5. Normal LV systolic function; mild diastolic dysfunction; moderate LVH; sclerotic aortic valve with trace AI; mild to moderate MR; mild LAE.  NST 12/2017:  Nuclear stress EF: 72%. No wall motion abnormalities  No evidence of significant perfusion defects. No TID (transient ischemic dilatation). No infarct or ischemia identified. During Lexiscan infusion, diffuse mild, 1 mm ST segment depression noted.  This is a low risk study. There is not appear to be  any ischemia identified. No ischemia in the previously placed RCA stent territory.  Event monitor 04/20/20:Study Highlights   Normal sinus rhythm  Rare PACs  Very rare PVCs  No atrial fibrillation noted    ASSESSMENT AND PLAN:  1. Chronic diastolic CHF: Overall volume status appears stable. Weight is stable. No edema. - Will continue lasix $RemoveBefore'40mg'bBMQCITxZTLdl$  daily  - Continue metoprolol - given  recent increased dyspnea will update Echo.  - Continue to limit salt intake  2. CAD s/p PCI/DES to RCA in 2012: no complaints of chest pain - Continue aspirin and metoprolol  3. HTN: BP well controlled - Continue metoprolol and diltiazem  4. HLD: LDL 39  5. CKD stage 3: Cr 1.27 - Continue to monitor routinely  6. Multiple Myeloma: starting new therapy with Revilmid. Potential for edema, palpitations, increased BP - will need to monitor.  - Continue management per Oncology  7. Anemia: Hgb stable at 12.  No overt bleeding.  - Continue to monitor closely per oncology.   8. GI bleed related to gastric ulcer and AVMs. Hgb has been stable.  9. Cryptogenic CVA. No evidence of Afib on monitor.   10. Elevated LFTs. May be more related to chemotherapy but unsure. Will hold statin therapy for now.  11. Emphysema. On Anoro.     Current medicines are reviewed at length with the patient today.  The patient does not have concerns regarding medicines.  The following changes have been made:  As above  Labs/ tests ordered today include:   Orders Placed This Encounter  Procedures  . ECHOCARDIOGRAM COMPLETE     Disposition:   FU with Dr. Martinique in 6 months  Signed, Calvina Liptak [place], MD  06/11/2020 11:50 AM

## 2020-06-08 ENCOUNTER — Other Ambulatory Visit: Payer: Self-pay

## 2020-06-08 ENCOUNTER — Inpatient Hospital Stay (HOSPITAL_BASED_OUTPATIENT_CLINIC_OR_DEPARTMENT_OTHER): Payer: Medicare Other | Admitting: Oncology

## 2020-06-08 ENCOUNTER — Inpatient Hospital Stay (HOSPITAL_COMMUNITY): Payer: Medicare Other

## 2020-06-08 ENCOUNTER — Ambulatory Visit (HOSPITAL_COMMUNITY)
Admission: RE | Admit: 2020-06-08 | Discharge: 2020-06-08 | Disposition: A | Discharge status: Home / Self Care | Payer: Medicare Other | Source: Ambulatory Visit | Attending: Oncology | Admitting: Oncology

## 2020-06-08 VITALS — BP 146/60 | HR 89 | Temp 97.1°F | Resp 18 | Wt 162.4 lb

## 2020-06-08 DIAGNOSIS — C9000 Multiple myeloma not having achieved remission: Secondary | ICD-10-CM | POA: Diagnosis not present

## 2020-06-08 DIAGNOSIS — R7401 Elevation of levels of liver transaminase levels: Secondary | ICD-10-CM | POA: Insufficient documentation

## 2020-06-08 LAB — CBC WITH DIFFERENTIAL/PLATELET
Abs Immature Granulocytes: 0.03 10*3/uL (ref 0.00–0.07)
Basophils Absolute: 0 10*3/uL (ref 0.0–0.1)
Basophils Relative: 0 %
Eosinophils Absolute: 0.2 10*3/uL (ref 0.0–0.5)
Eosinophils Relative: 2 %
HCT: 30.8 % — ABNORMAL LOW (ref 36.0–46.0)
Hemoglobin: 9.8 g/dL — ABNORMAL LOW (ref 12.0–15.0)
Immature Granulocytes: 0 %
Lymphocytes Relative: 10 %
Lymphs Abs: 0.7 10*3/uL (ref 0.7–4.0)
MCH: 34.8 pg — ABNORMAL HIGH (ref 26.0–34.0)
MCHC: 31.8 g/dL (ref 30.0–36.0)
MCV: 109.2 fL — ABNORMAL HIGH (ref 80.0–100.0)
Monocytes Absolute: 0.9 10*3/uL (ref 0.1–1.0)
Monocytes Relative: 12 %
Neutro Abs: 5.3 10*3/uL (ref 1.7–7.7)
Neutrophils Relative %: 76 %
Platelets: 167 10*3/uL (ref 150–400)
RBC: 2.82 MIL/uL — ABNORMAL LOW (ref 3.87–5.11)
RDW: 18.9 % — ABNORMAL HIGH (ref 11.5–15.5)
WBC: 7 10*3/uL (ref 4.0–10.5)
nRBC: 0 % (ref 0.0–0.2)

## 2020-06-08 LAB — COMPREHENSIVE METABOLIC PANEL
ALT: 354 U/L — ABNORMAL HIGH (ref 0–44)
AST: 144 U/L — ABNORMAL HIGH (ref 15–41)
Albumin: 3 g/dL — ABNORMAL LOW (ref 3.5–5.0)
Alkaline Phosphatase: 210 U/L — ABNORMAL HIGH (ref 38–126)
Anion gap: 8 (ref 5–15)
BUN: 23 mg/dL (ref 8–23)
CO2: 24 mmol/L (ref 22–32)
Calcium: 8.3 mg/dL — ABNORMAL LOW (ref 8.9–10.3)
Chloride: 108 mmol/L (ref 98–111)
Creatinine, Ser: 1.27 mg/dL — ABNORMAL HIGH (ref 0.44–1.00)
GFR, Estimated: 43 mL/min — ABNORMAL LOW (ref >60)
Glucose, Bld: 145 mg/dL — ABNORMAL HIGH (ref 70–99)
Potassium: 3.4 mmol/L — ABNORMAL LOW (ref 3.5–5.1)
Sodium: 140 mmol/L (ref 135–145)
Total Bilirubin: 0.8 mg/dL (ref 0.3–1.2)
Total Protein: 5.7 g/dL — ABNORMAL LOW (ref 6.5–8.1)

## 2020-06-08 NOTE — Progress Notes (Signed)
[place], [place] [address]   CLINIC:  Medical Oncology/Hematology  PCP:  [name], MD [address] / [place] [address] [phone]   REASON FOR VISIT:  Follow-up for multiple myeloma & IDA  PRIOR THERAPY: Darzalex & Pomalyst from 07/28/2019 to 08/25/2019  NGS Results: Not done  CURRENT THERAPY: Darzalex Faspro every 2 weeks; Revlimid 3 weeks on, 1 week off; intermittent Ferahame last on 05/11/2020  BRIEF ONCOLOGIC HISTORY:  Oncology History  Multiple myeloma ([facility] [facility])  02/26/2015 Initial Biopsy   BMBX 50% cellularity, igG kappa myeloma, IgG at 3600 mg/dl, FISH with monosomy of chromosome 13, gain 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    03/18/2015 - 07/28/2015 Chemotherapy   Velcade 1.6 mg/m2 discontinued secondary to intolerance   07/28/2015 Adverse Reaction   stool incontinence, weakness, collapse upon standing, felt to be secondary to velcade   11/09/2015 - 12/13/2015 Chemotherapy   cytoxan IV 300 mg/m2 administered X 2 doses only in addition to rev/dex   11/09/2015 - 06/08/2016 Chemotherapy   Revlimid/Dexamethasone    02/05/2017 - 07/09/2017 Chemotherapy   The patient had bortezomib SQ (VELCADE) chemo injection 1.75 mg, 1 mg/m2 = 1.75 mg (66.7 % of original dose 1.5 mg/m2), Subcutaneous,  Once, 1 of 8 cycles Dose modification: 1.5 mg/m2 (original dose 1.5 mg/m2, Cycle 1, Reason: Provider Judgment, Comment: Per Dr. Norma Fredrickson recommendations from [facility] 1 mg/m2 (original dose 1.5 mg/m2, Cycle 1, Reason: Provider Judgment)  for chemotherapy treatment.     07/09/2017 Adverse Reaction   Diarrhea    Chemotherapy   Pomalyst 2 mg (Days 1-21 every 28 days), Ixazomib 2.3 mg (days 1, 8, 15 every 28 days), and Dexamethasone 10 mg (weekly)- Rx's printed on 08/24/2017.  Treatment recommendations from Dr. Norma Fredrickson at [facility].   07/28/2019 -  Chemotherapy   The patient had dexamethasone (DECADRON) tablet 20 mg, 20 mg, Oral,  Once, 7 of 10 cycles Dose modification: 10 mg (original dose 10 mg, Cycle 1) Administration: 20 mg (07/28/2019), 10 mg (08/04/2019), 10 mg (08/11/2019), 10 mg (08/25/2019), 10 mg (12/09/2019), 10 mg (12/16/2019), 10 mg (12/23/2019), 10 mg (12/30/2019), 10 mg (01/06/2020), 10 mg (01/13/2020), 10 mg (03/23/2020) daratumumab-hyaluronidase-fihj (DARZALEX FASPRO) 1800-30000 MG-UT/15ML chemo SQ injection 1,800 mg, 1,800 mg, Subcutaneous,  Once, 7 of 10 cycles Administration: 1,800 mg (07/28/2019), 1,800 mg (08/04/2019), 1,800 mg (08/11/2019), 1,800 mg (08/25/2019), 1,800 mg (12/09/2019), 1,800 mg (12/16/2019), 1,800 mg (12/23/2019), 1,800 mg (12/30/2019), 1,800 mg (01/06/2020), 1,800 mg (01/13/2020), 1,800 mg (05/04/2020), 1,800 mg (02/10/2020), 1,800 mg (02/24/2020), 1,800 mg (01/20/2020), 1,800 mg (01/27/2020), 1,800 mg (03/09/2020), 1,800 mg (03/23/2020), 1,800 mg (04/06/2020), 1,800 mg (04/20/2020)  for chemotherapy treatment.      CANCER STAGING: Cancer Staging No matching staging information was found for the patient.  INTERVAL HISTORY:  Ms. BREANA LITTS, a 79 y.o. female, returns for follow-up from recent emergency room visit.  Evaluated in the emergency room on 06/01/2020 for acute shortness of breath.  Work-up was essentially negative.  She has follow-up with cardiology next week for additional work-up.  She does have history of CAD, emphysema and heart failure.  She takes Lasix daily.  Denies any swelling.  Today, she states she does not feel much better.  She has not had  Revlimid in over a 1 week.  Endorses significant fatigue and no energy.  She denies any pain.  She is having normal bowel movements.  She denies any fevers or recent infections.   REVIEW  OF SYSTEMS:  Review of Systems  Constitutional: Positive for appetite change (50%) and fatigue (depleted).  Respiratory: Positive for shortness of breath (worsening).   Psychiatric/Behavioral: Positive for sleep disturbance.  All other systems reviewed and are  negative.   PAST MEDICAL/SURGICAL HISTORY:  Past Medical History:  Diagnosis Date  . 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 CAD (coronary artery disease)   . COPD (chronic obstructive pulmonary disease) (HCC)   . History of tobacco abuse   . Hyperlipidemia   . Hypertension   . 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 Multiple myeloma (HCC)   . Vitamin B12 deficiency 04/15/2016   Overview:  Vitamin B12 level documented 155, January 2016 with the normal range being 211-924   Past Surgical History:  Procedure Laterality Date  . BACK SURGERY    . CARDIAC CATHETERIZATION  04/2011   right and left cath showing normal right heart pressures,but newly diagnosed coronary artery disease/drug eluting stent placed to RCA with residual disease in the proximal RCA and LAD and ramus, normal LV function and 60-65% EF  . COLONOSCOPY N/A 09/30/2014   Procedure: COLONOSCOPY;  Surgeon: Najeeb U Rehman, MD;  Location: [facility];  Service: Endoscopy;  Laterality: N/A;  225  . CORONARY ANGIOPLASTY WITH STENT PLACEMENT  04/2011   mid RCA: 3.0 X38 mm Promus DES. Residual 40% disease proximally  . ESOPHAGOGASTRODUODENOSCOPY N/A 09/10/2019   Procedure: ESOPHAGOGASTRODUODENOSCOPY (EGD);  Surgeon: Magod, Marc, MD;  Location: [facility] END[facility];  Service: Endoscopy;  Laterality: N/A;  . NECK SURGERY      SOCIAL HISTORY:  Social History   Socioeconomic History  . Marital status: Widowed    Spouse name: Not on file  . Number of children: Not on file  . Years of education: Not on file  . Highest education level: Not on file  Occupational History  . 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 Smoking status: Former Smoker    Packs/day: 3.00    Years: 25.00    Pack years: 75.00    Types: Cigarettes    Quit date: 07/10/1994    Years since quitting: 25.9  . Smokeless tobacco: Never Used  Vaping Use  . Vaping Use: Never used  Substance and Sexual  Activity  . Alcohol use: No  . Drug use: Never  . Sexual activity: Not on file  Other Topics Concern  . Not on file  Social History Narrative  . Not on file   Social Determinants of Health   Financial Resource Strain: Low Risk   . Difficulty of Paying Living Expenses: Not hard at all  Food Insecurity: No Food Insecurity  . Worried About Running Out of Food in the Last Year: Never true  . Ran Out of Food in the Last Year: Never true  Transportation Needs: No Transportation Needs  . Lack of Transportation (Medical): No  . Lack of Transportation (Non-Medical): No  Physical Activity: Inactive  . Days of Exercise per Week: 0 days  . Minutes of Exercise per Session: 0 min  Stress: No Stress Concern Present  . Feeling of Stress : Not at all  Social Connections: Socially Isolated  . Frequency of Communication with Friends and Family: More than three times a week  . Frequency of Social Gatherings with Friends and Family: More than three times a week  . Attends Religious Services: Never  . Active Member of Clubs or Organizations: No  . Attends Club   or Organization Meetings: Never  . Marital Status: Widowed  Intimate Partner Violence: Not At Risk  . Fear of Current or Ex-Partner: No  . Emotionally Abused: No  . Physically Abused: No  . Sexually Abused: No    FAMILY HISTORY:  Family History  Problem Relation Age of Onset  . Heart failure Mother   . Cancer Father     CURRENT MEDICATIONS:  Current Outpatient Medications  Medication Sig Dispense Refill  . albuterol (VENTOLIN HFA) 108 (90 Base) MCG/ACT inhaler Inhale 2 puffs into the lungs every 6 (six) hours as needed for wheezing or shortness of breath. 18 g 2  . aspirin 325 MG tablet Take 325 mg by mouth at bedtime.    . atorvastatin (LIPITOR) 10 MG tablet TAKE ONE TABLET BY MOUTH DAILY 90 tablet 1  . Calcium Carbonate-Vit D-Min (CALCIUM 600+D PLUS MINERALS) 600-400 MG-UNIT CHEW Chew 1 tablet by mouth 3 (three) times daily.      . cyanocobalamin (,VITAMIN B-12,) 1000 MCG/ML injection Inject 1,000 mcg into the skin every 30 (thirty) days.     . daratumumab-hyaluronidase-fihj (DARZALEX FASPRO) 1800-30000 MG-UT/15ML SOLN Inject 1,800 mg into the skin once.    . dexamethasone (DECADRON) 2 MG tablet Take 5 tablets once weekly. 30 tablet 3  . diazepam (VALIUM) 5 MG tablet TAKE ONE TABLET BY MOUTH EVERY 6 HOURS AS NEEDED FOR ANXIETY 30 tablet 1  . diltiazem (CARDIZEM CD) 240 MG 24 hr capsule TAKE ONE CAPSULE BY MOUTH EVERY DAY 90 capsule 3  . famotidine (PEPCID) 20 MG tablet Take 20 mg by mouth 2 (two) times daily as needed.     . fluticasone (FLONASE) 50 MCG/ACT nasal spray Place 2 sprays into both nostrils as needed.     . furosemide (LASIX) 40 MG tablet Take 40 mg by mouth daily as needed.     . ipratropium (ATROVENT) 0.06 % nasal spray SMARTSIG:2 Spray(s) Both Nares Twice Daily PRN    . lenalidomide (REVLIMID) 10 MG capsule Take 1 capsule (10 mg total) by mouth daily as directed. 21 capsule 10  . levofloxacin (LEVAQUIN) 500 MG tablet Take 500 mg by mouth daily.    . magnesium oxide (MAG-OX) 400 MG tablet TAKE ONE TABLET BY MOUTH TWICE DAILY. WHEN TAKING LASIX 60 tablet 2  . metoprolol tartrate (LOPRESSOR) 25 MG tablet TAKE 1 AND 1/2 TABLETS BY MOUTH TWICE DAILY 75 tablet 3  . pantoprazole (PROTONIX) 40 MG tablet Take 1 tablet (40 mg total) by mouth 2 (two) times daily. 60 tablet 0  . polyethylene glycol powder (GLYCOLAX/MIRALAX) 17 GM/SCOOP powder Take by mouth as needed.     . potassium chloride (KLOR-CON) 10 MEQ tablet TAKE ONE TABLET BY MOUTH THREE TIMES DAILY. 90 tablet 4  . tetracycline (SUMYCIN) 500 MG capsule Take 500 mg by mouth 2 (two) times daily.     . umeclidinium-vilanterol (ANORO ELLIPTA) 62.5-25 MCG/INH AEPB Inhale 1 puff into the lungs daily. 60 each 1  . ondansetron (ZOFRAN-ODT) 4 MG disintegrating tablet Take 4 mg by mouth every 6 (six) hours as needed. (Patient not taking: Reported on 06/08/2020)     No  current facility-administered medications for this visit.    ALLERGIES:  Allergies  Allergen Reactions  . Lisinopril Cough  . Plavix [Clopidogrel Bisulfate] Swelling     PHYSICAL EXAM:  Performance status (ECOG): 1 - Symptomatic but completely ambulatory  Vitals:   06/08/20 1417  BP: (!) 146/60  Pulse: 89  Resp: 18  Temp: (!) 97.1 F (  36.2 C)  SpO2: 100%   Wt Readings from Last 3 Encounters:  06/08/20 162 lb 6.4 oz (73.7 kg)  06/01/20 157 lb 6.5 oz (71.4 kg)  06/01/20 157 lb 8 oz (71.4 kg)   Physical Exam Vitals reviewed.  Constitutional:      Appearance: Normal appearance.  Cardiovascular:     Rate and Rhythm: Normal rate and regular rhythm.     Pulses: Normal pulses.     Heart sounds: Normal heart sounds.  Pulmonary:     Effort: Pulmonary effort is normal.     Breath sounds: Normal breath sounds.  Neurological:     General: No focal deficit present.     Mental Status: She is alert and oriented to person, place, and time.  Psychiatric:        Mood and Affect: Mood normal.        Behavior: Behavior normal.     LABORATORY DATA:  I have reviewed the labs as listed.  CBC Latest Ref Rng & Units 06/08/2020 06/01/2020 05/04/2020  WBC 4.0 - 10.5 K/uL 7.0 5.6 5.6  Hemoglobin 12.0 - 15.0 g/dL 9.8(L) 10.6(L) 9.6(L)  Hematocrit 36 - 46 % 30.8(L) 33.9(L) 30.9(L)  Platelets 150 - 400 K/uL 167 207 176   CMP Latest Ref Rng & Units 06/08/2020 06/01/2020 05/04/2020  Glucose 70 - 99 mg/dL 145(H) 92 97  BUN 8 - 23 mg/dL 23 22 28(H)  Creatinine 0.44 - 1.00 mg/dL 1.27(H) 1.51(H) 1.38(H)  Sodium 135 - 145 mmol/L 140 140 137  Potassium 3.5 - 5.1 mmol/L 3.4(L) 3.8 4.3  Chloride 98 - 111 mmol/L 108 105 108  CO2 22 - 32 mmol/L 24 27 23  Calcium 8.9 - 10.3 mg/dL 8.3(L) 8.9 8.0(L)  Total Protein 6.5 - 8.1 g/dL 5.7(L) 6.2(L) 5.9(L)  Total Bilirubin 0.3 - 1.2 mg/dL 0.8 0.6 1.0  Alkaline Phos 38 - 126 U/L 210(H) 183(H) 90  AST 15 - 41 U/L 144(H) 101(H) 50(H)  ALT 0 - 44 U/L  354(H) 280(H) 57(H)   Lab Results  Component Value Date   LDH 151 06/01/2020   LDH 190 05/04/2020   LDH 145 04/06/2020   Lab Results  Component Value Date   TOTALPROTELP 5.6 (L) 06/01/2020   ALBUMINELP 2.8 (L) 06/01/2020   A1GS 0.3 06/01/2020   A2GS 0.9 06/01/2020   BETS 0.8 06/01/2020   GAMS 0.8 06/01/2020   MSPIKE 0.7 (H) 06/01/2020   SPEI Comment 06/01/2020    Lab Results  Component Value Date   KPAFRELGTCHN 435.4 (H) 06/01/2020   LAMBDASER 4.7 (L) 06/01/2020   KAPLAMBRATIO 92.64 (H) 06/01/2020    DIAGNOSTIC IMAGING:  I have independently reviewed the scans and discussed with the patient. DG Chest 2 View  Result Date: 05/11/2020 CLINICAL DATA:  Productive cough EXAM: CHEST - 2 VIEW COMPARISON:  None. FINDINGS: Anterior cervical fusion. Normal mediastinum and cardiac silhouette. Chronic central bronchitic markings. Normal pulmonary vasculature. No effusion, infiltrate, or pneumothorax. IMPRESSION: Chronic bronchitic markings.  No acute findings. Electronically Signed   By: Stewart  Edmunds M.D.   On: 05/11/2020 08:06   CT Angio Chest PE W and/or Wo Contrast  Result Date: 06/01/2020 CLINICAL DATA:  h/o multiple myeloma, having increased shortness of breath, PE suspected. EXAM: CT ANGIOGRAPHY CHEST WITH CONTRAST TECHNIQUE: Multidetector CT imaging of the chest was performed using the standard protocol during bolus administration of intravenous contrast. Multiplanar CT image reconstructions and MIPs were obtained to evaluate the vascular anatomy. CONTRAST:  75mL OMNIPAQUE IOHEXOL 350 MG/ML   SOLN COMPARISON:  CT abdomen pelvis 10/07/2014. CT angio neck 01/02/2020 FINDINGS: Cardiovascular: Satisfactory opacification of the pulmonary arteries to the segmental level. No evidence of pulmonary embolism. The main pulmonary artery measures at the upper limits of normal. Normal heart size. No pericardial effusion. The thoracic aorta is normal in caliber. There is moderate calcified and  noncalcified atherosclerotic plaque. Four-vessel coronary artery calcifications. Possible right coronary artery stent. Mediastinum/Nodes: Several prominent but nonenlarged mediastinal lymph nodes are identified. No enlarged mediastinal, hilar, or axillary lymph nodes. Thyroid gland, trachea, and esophagus demonstrate no significant findings. Small hiatal hernia. Lungs/Pleura: At least moderate centrilobular emphysematous changes. Bilateral lower lobe subsegmental atelectasis. Linear scarring versus atelectasis of the lingula. Similar-appearing pulmonary micronodule within the right upper lobe (6:32). Several other scattered bilateral pulmonary micronodules are again noted along the subpleural regions. No pulmonary mass. No focal consolidation. No pleural effusion. No pneumothorax. Upper Abdomen: Couple of partially visualized fluid density lesion within the left kidney likely represents simple renal cysts. No acute abnormality. Musculoskeletal: No chest wall abnormality. Partially visualized anterior cervical surgical hardware. Similar-appearing grade 1 anterolisthesis of C7 on T1. No suspicious lytic or blastic osseous lesions. No acute displaced fracture. Multilevel degenerative changes of the spine. Review of the MIP images confirms the above findings. IMPRESSION: 1. No pulmonary embolus. 2. No acute intrathoracic abnormality. 3. Small hiatal hernia. 4. Aortic Atherosclerosis (ICD10-I70.0) and Emphysema (ICD10-J43.9). Electronically Signed   By: Iven Finn M.D.   On: 06/01/2020 16:40     ASSESSMENT:  1. 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 -Hospitalized with severe anemia, weakness and pneumonia and shortness of breath on 09/05/2019 through 09/15/2019. -Myeloma labs from 12/12/2019 shows M spike increased to 0.6 g. Kappa light chains increased to 93 and ratio increased to  10.96. -Darzalex single agent started back on 12/09/2019. Pomalidomide discontinued after hospitalization. -Revlimid 10 mg 3 weeks on 1 week off started on 03/01/2020. -Revlimid and Darzalex held secondary to acute shortness of breath and transaminitis on 06/01/2020.   2. 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 -H. pylori antibody was positive. Dr. Laural Golden is starting her on antibiotics. -Ultrasound of the abdomen on 11/27/2019 showed increased echogenicity of hepatic parenchyma consistent with steatosis. Mild amount of sludge noted in the gallbladder. 10.3 cm bilobed cyst noted in the left kidney.  PLAN:  1. IgG kappa multiple myeloma: -She is taking Revlimid 10 mg 3 weeks on 1 week off.  She is also taking dexamethasone 10 mg weekly. -Last week she was evaluated and complained of acute shortness of breath and found to have elevated LFTs.  She was sent to the emergency room for evaluation.  CTA negative.  -Symptoms have not improved and she remains short of breath. -Today's lab work shows improvement of her kidney function but slightly worsening anemia and worsening LFTs. -Recommend stat ultrasound of right upper quadrant to evaluate liver. -Transaminitis likely secondary to treatment but given she has been off treatment for a little over a week would like to rule out other abnormalities. -Continue to hold Revlimid and Darzalex until labs improve. -RTC in 1 week for repeat labs and to review imaging.  2. Weight loss: -She lost about 8 pounds in the last 1 month.  3. Myeloma bone disease: -Continue Zometa every 3 months.  4. CKD: -Creatinine is improving and is 1.27 today. -Appetite is good.  Recommend pushing fluids.  5. Normocytic  anemia: -She has received Feraheme with improvement of hemoglobin to 10.6 from 9.6 a month ago. -Hemoglobin trending back down and is 9.8 today.  6.  Shortness of breath: -Likely secondary to comorbidities.   -Ruled out PE and infection last week while in the emergency room. -Troponin negative. -EKG normal. -Has cardiology follow-up next week on 06/11/2020.  Disposition: -RTC in 1 week for repeat labs (CBC, CMP and LDH) and to discuss imaging and restarting medication likely at a reduced dose..   Orders placed this encounter:  Orders Placed This Encounter  Procedures  . US Abdomen Limited RUQ (LIVER/GB)   Jennifer Burns, NP 06/08/2020 2:50 PM  [facility] [phone]   

## 2020-06-11 ENCOUNTER — Other Ambulatory Visit: Payer: Self-pay

## 2020-06-11 ENCOUNTER — Encounter: Payer: Self-pay | Admitting: Cardiology

## 2020-06-11 ENCOUNTER — Ambulatory Visit (INDEPENDENT_AMBULATORY_CARE_PROVIDER_SITE_OTHER): Payer: Medicare Other | Admitting: Cardiology

## 2020-06-11 VITALS — BP 123/71 | HR 83 | Temp 96.8°F | Ht 67.0 in | Wt 160.6 lb

## 2020-06-11 DIAGNOSIS — I5032 Chronic diastolic (congestive) heart failure: Secondary | ICD-10-CM

## 2020-06-11 DIAGNOSIS — I639 Cerebral infarction, unspecified: Secondary | ICD-10-CM

## 2020-06-11 DIAGNOSIS — C9000 Multiple myeloma not having achieved remission: Secondary | ICD-10-CM

## 2020-06-11 DIAGNOSIS — R0600 Dyspnea, unspecified: Secondary | ICD-10-CM

## 2020-06-11 DIAGNOSIS — I340 Nonrheumatic mitral (valve) insufficiency: Secondary | ICD-10-CM

## 2020-06-11 DIAGNOSIS — R0609 Other forms of dyspnea: Secondary | ICD-10-CM

## 2020-06-11 NOTE — Patient Instructions (Addendum)
Medication Instructions:  Stop Lipitor Continue all other medications *If you need a refill on your cardiac medications before your next appointment, please call your pharmacy*   Lab Work: None ordered   Testing/Procedures: Echo   Follow-Up: At [facility], you and your health needs are our priority.  As part of our continuing mission to provide you with exceptional heart care, we have created designated Provider Care Teams.  These Care Teams include your primary Cardiologist (physician) and Advanced Practice Providers (APPs -  Physician Assistants and Nurse Practitioners) who all work together to provide you with the care you need, when you need it.  We recommend signing up for the patient portal called "MyChart".  Sign up information is provided on this After Visit Summary.  MyChart is used to connect with patients for Virtual Visits (Telemedicine).  Patients are able to view lab/test results, encounter notes, upcoming appointments, etc.  Non-urgent messages can be sent to your provider as well.   To learn more about what you can do with MyChart, go to [email or web address].    Your next appointment:  6 months     Call in Feb to schedule June appointment    The format for your next appointment: Office    Provider:  Dr.Jordan

## 2020-06-15 ENCOUNTER — Other Ambulatory Visit: Payer: Self-pay

## 2020-06-15 ENCOUNTER — Ambulatory Visit (HOSPITAL_COMMUNITY)
Admission: RE | Admit: 2020-06-15 | Discharge: 2020-06-15 | Disposition: A | Discharge status: Home / Self Care | Payer: Medicare Other | Source: Ambulatory Visit | Attending: Cardiology | Admitting: Cardiology

## 2020-06-15 DIAGNOSIS — R0600 Dyspnea, unspecified: Secondary | ICD-10-CM | POA: Diagnosis not present

## 2020-06-15 DIAGNOSIS — I5032 Chronic diastolic (congestive) heart failure: Secondary | ICD-10-CM

## 2020-06-15 DIAGNOSIS — I340 Nonrheumatic mitral (valve) insufficiency: Secondary | ICD-10-CM | POA: Diagnosis not present

## 2020-06-15 DIAGNOSIS — R0609 Other forms of dyspnea: Secondary | ICD-10-CM

## 2020-06-15 LAB — ECHOCARDIOGRAM COMPLETE
AR max vel: 2.29 cm2
AV Area VTI: 2.21 cm2
AV Area mean vel: 2.38 cm2
AV Mean grad: 4 mmHg
AV Peak grad: 7.3 mmHg
Ao pk vel: 1.35 m/s
Area-P 1/2: 4.29 cm2
MV M vel: 5.34 m/s
MV Peak grad: 114.1 mmHg
P 1/2 time: 457 msec
Radius: 0.6 cm
S' Lateral: 3.1 cm

## 2020-06-15 NOTE — Progress Notes (Signed)
*  PRELIMINARY RESULTS* Echocardiogram 2D Echocardiogram has been performed.  Carol Bradley 06/15/2020, 2:50 PM

## 2020-06-16 ENCOUNTER — Other Ambulatory Visit (HOSPITAL_COMMUNITY): Payer: Self-pay

## 2020-06-16 ENCOUNTER — Telehealth: Payer: Self-pay | Admitting: Cardiology

## 2020-06-16 DIAGNOSIS — C9000 Multiple myeloma not having achieved remission: Secondary | ICD-10-CM

## 2020-06-16 NOTE — Telephone Encounter (Signed)
Carol Bradley is returning Cheryl's call in regards to her Echo results. Please advise.

## 2020-06-16 NOTE — Telephone Encounter (Signed)
Left message to call back   Carol M Martinique, MD  06/15/2020 3:12 PM EST     This study demonstrates: Echo still shows normal LV function. Only mild valvular disease Medication changes / Follow up studies / Other recommendations:  Nothing that would explain her increased dyspnea from a heart standpoint.

## 2020-06-17 ENCOUNTER — Inpatient Hospital Stay (HOSPITAL_BASED_OUTPATIENT_CLINIC_OR_DEPARTMENT_OTHER): Payer: Medicare Other | Admitting: Hematology and Oncology

## 2020-06-17 ENCOUNTER — Inpatient Hospital Stay (HOSPITAL_COMMUNITY): Payer: Medicare Other

## 2020-06-17 ENCOUNTER — Other Ambulatory Visit: Payer: Self-pay

## 2020-06-17 ENCOUNTER — Inpatient Hospital Stay (HOSPITAL_COMMUNITY): Payer: Medicare Other | Attending: Hematology and Oncology

## 2020-06-17 DIAGNOSIS — Z79899 Other long term (current) drug therapy: Secondary | ICD-10-CM | POA: Insufficient documentation

## 2020-06-17 DIAGNOSIS — C9000 Multiple myeloma not having achieved remission: Secondary | ICD-10-CM | POA: Diagnosis present

## 2020-06-17 DIAGNOSIS — D509 Iron deficiency anemia, unspecified: Secondary | ICD-10-CM

## 2020-06-17 DIAGNOSIS — Z7982 Long term (current) use of aspirin: Secondary | ICD-10-CM | POA: Diagnosis not present

## 2020-06-17 DIAGNOSIS — R634 Abnormal weight loss: Secondary | ICD-10-CM | POA: Diagnosis not present

## 2020-06-17 DIAGNOSIS — I639 Cerebral infarction, unspecified: Secondary | ICD-10-CM

## 2020-06-17 DIAGNOSIS — D638 Anemia in other chronic diseases classified elsewhere: Secondary | ICD-10-CM

## 2020-06-17 DIAGNOSIS — Z9221 Personal history of antineoplastic chemotherapy: Secondary | ICD-10-CM | POA: Diagnosis not present

## 2020-06-17 DIAGNOSIS — E538 Deficiency of other specified B group vitamins: Secondary | ICD-10-CM

## 2020-06-17 DIAGNOSIS — N189 Chronic kidney disease, unspecified: Secondary | ICD-10-CM | POA: Insufficient documentation

## 2020-06-17 LAB — CBC WITH DIFFERENTIAL/PLATELET
Abs Immature Granulocytes: 0 10*3/uL (ref 0.00–0.07)
Basophils Absolute: 0 10*3/uL (ref 0.0–0.1)
Basophils Relative: 0 %
Eosinophils Absolute: 0.2 10*3/uL (ref 0.0–0.5)
Eosinophils Relative: 4 %
HCT: 30.3 % — ABNORMAL LOW (ref 36.0–46.0)
Hemoglobin: 9.4 g/dL — ABNORMAL LOW (ref 12.0–15.0)
Immature Granulocytes: 0 %
Lymphocytes Relative: 23 %
Lymphs Abs: 1 10*3/uL (ref 0.7–4.0)
MCH: 34.1 pg — ABNORMAL HIGH (ref 26.0–34.0)
MCHC: 31 g/dL (ref 30.0–36.0)
MCV: 109.8 fL — ABNORMAL HIGH (ref 80.0–100.0)
Monocytes Absolute: 0.5 10*3/uL (ref 0.1–1.0)
Monocytes Relative: 13 %
Neutro Abs: 2.6 10*3/uL (ref 1.7–7.7)
Neutrophils Relative %: 60 %
Platelets: 148 10*3/uL — ABNORMAL LOW (ref 150–400)
RBC: 2.76 MIL/uL — ABNORMAL LOW (ref 3.87–5.11)
RDW: 18.3 % — ABNORMAL HIGH (ref 11.5–15.5)
WBC: 4.2 10*3/uL (ref 4.0–10.5)
nRBC: 0 % (ref 0.0–0.2)

## 2020-06-17 LAB — COMPREHENSIVE METABOLIC PANEL
ALT: 52 U/L — ABNORMAL HIGH (ref 0–44)
AST: 36 U/L (ref 15–41)
Albumin: 2.9 g/dL — ABNORMAL LOW (ref 3.5–5.0)
Alkaline Phosphatase: 133 U/L — ABNORMAL HIGH (ref 38–126)
Anion gap: 7 (ref 5–15)
BUN: 23 mg/dL (ref 8–23)
CO2: 24 mmol/L (ref 22–32)
Calcium: 8.1 mg/dL — ABNORMAL LOW (ref 8.9–10.3)
Chloride: 108 mmol/L (ref 98–111)
Creatinine, Ser: 1.27 mg/dL — ABNORMAL HIGH (ref 0.44–1.00)
GFR, Estimated: 43 mL/min — ABNORMAL LOW (ref >60)
Glucose, Bld: 126 mg/dL — ABNORMAL HIGH (ref 70–99)
Potassium: 3.7 mmol/L (ref 3.5–5.1)
Sodium: 139 mmol/L (ref 135–145)
Total Bilirubin: 0.6 mg/dL (ref 0.3–1.2)
Total Protein: 6.1 g/dL — ABNORMAL LOW (ref 6.5–8.1)

## 2020-06-17 LAB — LACTATE DEHYDROGENASE: LDH: 176 U/L (ref 98–192)

## 2020-06-17 MED ORDER — DIPHENHYDRAMINE HCL 25 MG PO CAPS
ORAL_CAPSULE | ORAL | Status: AC
  Filled 2020-06-17: qty 2

## 2020-06-17 MED ORDER — DEXAMETHASONE 4 MG PO TABS
10.0000 mg | ORAL_TABLET | Freq: Once | ORAL | Status: AC
  Administered 2020-06-17: 10 mg via ORAL

## 2020-06-17 MED ORDER — LENALIDOMIDE 10 MG PO CAPS: ORAL_CAPSULE | ORAL | 0 refills | Status: DC

## 2020-06-17 MED ORDER — ACETAMINOPHEN 325 MG PO TABS
ORAL_TABLET | ORAL | Status: AC
  Filled 2020-06-17: qty 2

## 2020-06-17 MED ORDER — ACETAMINOPHEN 325 MG PO TABS
650.0000 mg | ORAL_TABLET | Freq: Once | ORAL | Status: AC
  Administered 2020-06-17: 650 mg via ORAL

## 2020-06-17 MED ORDER — DEXAMETHASONE 4 MG PO TABS
ORAL_TABLET | ORAL | Status: AC
  Filled 2020-06-17: qty 3

## 2020-06-17 MED ORDER — DIPHENHYDRAMINE HCL 25 MG PO CAPS
50.0000 mg | ORAL_CAPSULE | Freq: Once | ORAL | Status: AC
  Administered 2020-06-17: 50 mg via ORAL

## 2020-06-17 MED ORDER — ZOLEDRONIC ACID 4 MG/5ML IV CONC
3.0000 mg | Freq: Once | INTRAVENOUS | Status: AC
  Administered 2020-06-17: 3 mg via INTRAVENOUS
  Filled 2020-06-17: qty 3.75

## 2020-06-17 MED ORDER — CYANOCOBALAMIN 1000 MCG/ML IJ SOLN
1000.0000 ug | Freq: Once | INTRAMUSCULAR | Status: AC
  Administered 2020-06-17: 1000 ug via INTRAMUSCULAR
  Filled 2020-06-17: qty 1

## 2020-06-17 MED ORDER — SODIUM CHLORIDE 0.9 % IV SOLN: Freq: Once | INTRAVENOUS | Status: AC

## 2020-06-17 MED ORDER — DARATUMUMAB-HYALURONIDASE-FIHJ 1800-30000 MG-UT/15ML ~~LOC~~ SOLN
1800.0000 mg | Freq: Once | SUBCUTANEOUS | Status: AC
  Administered 2020-06-17: 1800 mg via SUBCUTANEOUS
  Filled 2020-06-17: qty 15

## 2020-06-17 NOTE — Progress Notes (Signed)
Patient Care Team: [name], MD as PCP - General (Internal Medicine) Martinique, Peter M, MD as PCP - Cardiology (Cardiology) Derek Jack, MD as Consulting Physician (Hematology) Rogene Houston, MD as Consulting Physician (Gastroenterology)  DIAGNOSIS:  Encounter Diagnosis  Name Primary?  . 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    03/18/2015 - 07/28/2015 Chemotherapy   Velcade 1.6 mg/m2 discontinued secondary to intolerance   07/28/2015 Adverse Reaction   stool incontinence, weakness, collapse upon standing, felt to be secondary to velcade   11/09/2015 - 12/13/2015 Chemotherapy   cytoxan IV 300 mg/m2 administered X 2 doses only in addition to rev/dex   11/09/2015 - 06/08/2016 Chemotherapy   Revlimid/Dexamethasone    02/05/2017 - 07/09/2017 Chemotherapy   The patient had bortezomib SQ (VELCADE) chemo injection 1.75 mg, 1 mg/m2 = 1.75 mg (66.7 % of original dose 1.5 mg/m2), Subcutaneous,  Once, 1 of 8 cycles Dose modification: 1.5 mg/m2 (original dose 1.5 mg/m2, Cycle 1, Reason: Provider Judgment, Comment: Per Dr. Norma Fredrickson recommendations from [facility] 1 mg/m2 (original dose 1.5 mg/m2, Cycle 1, Reason: Provider Judgment)  for chemotherapy treatment.     07/09/2017 Adverse Reaction   Diarrhea    Chemotherapy   Pomalyst 2 mg (Days 1-21 every 28 days), Ixazomib 2.3 mg (days 1, 8, 15 every 28 days), and Dexamethasone 10 mg (weekly)- Rx's printed on 08/24/2017.  Treatment recommendations from Dr. Norma Fredrickson at [facility].   07/28/2019 -  Chemotherapy   The patient had dexamethasone (DECADRON) tablet 20 mg, 20 mg, Oral, Once, 7 of 10 cycles Dose modification: 10 mg (original dose 10 mg, Cycle  1) Administration: 20 mg (07/28/2019), 10 mg (08/04/2019), 10 mg (08/11/2019), 10 mg (08/25/2019), 10 mg (12/09/2019), 10 mg (12/16/2019), 10 mg (12/23/2019), 10 mg (12/30/2019), 10 mg (01/06/2020), 10 mg (01/13/2020), 10 mg (03/23/2020) daratumumab-hyaluronidase-fihj (DARZALEX FASPRO) 1800-30000 MG-UT/15ML chemo SQ injection 1,800 mg, 1,800 mg, Subcutaneous,  Once, 7 of 10 cycles Administration: 1,800 mg (07/28/2019), 1,800 mg (08/04/2019), 1,800 mg (08/11/2019), 1,800 mg (08/25/2019), 1,800 mg (12/09/2019), 1,800 mg (12/16/2019), 1,800 mg (12/23/2019), 1,800 mg (12/30/2019), 1,800 mg (01/06/2020), 1,800 mg (01/13/2020), 1,800 mg (05/04/2020), 1,800 mg (02/10/2020), 1,800 mg (02/24/2020), 1,800 mg (01/20/2020), 1,800 mg (01/27/2020), 1,800 mg (03/09/2020), 1,800 mg (03/23/2020), 1,800 mg (04/06/2020), 1,800 mg (04/20/2020)  for chemotherapy treatment.      CHIEF COMPLIANT: Follow-up of multiple myeloma  INTERVAL HISTORY: Carol Bradley is a 79 year old with above-mentioned history of multiple myeloma who was on Revlimid and Darzalex.  Her treatment was held previously because of elevation of LFTs.  Today her LFTs came back to normal range and therefore we can reinitiate therapy.  She feels fatigued.  Does complain of severe shortness of breath even at rest.  CT ruled out PE.  Echocardiogram ruled out cardiac etiology.  I suspect that the cause of shortness of breath could be anemia.  This is limiting her ability to move around.   ALLERGIES:  is allergic to lisinopril, other, and plavix [clopidogrel bisulfate].  MEDICATIONS:  Current Outpatient Medications  Medication Sig Dispense Refill  . albuterol (VENTOLIN HFA) 108 (90 Base) MCG/ACT inhaler Inhale 2 puffs into the lungs every 6 (six) hours as needed for wheezing or shortness of breath. 18 g 2  .  aspirin 325 MG tablet Take 325 mg by mouth at bedtime.    . Calcium Carbonate-Vit D-Min (CALCIUM 600+D PLUS MINERALS) 600-400 MG-UNIT CHEW Chew 1 tablet by mouth 3 (three) times daily.      . cyanocobalamin (,VITAMIN B-12,) 1000 MCG/ML injection Inject 1,000 mcg into the skin every 30 (thirty) days.     Marland Kitchen dexamethasone (DECADRON) 2 MG tablet Take 5 tablets once weekly. 30 tablet 3  . diltiazem (CARDIZEM CD) 240 MG 24 hr capsule TAKE ONE CAPSULE BY MOUTH EVERY DAY 90 capsule 3  . famotidine (PEPCID) 20 MG tablet Take 20 mg by mouth 2 (two) times daily as needed.     . fluticasone (FLONASE) 50 MCG/ACT nasal spray Place 2 sprays into both nostrils as needed.     . furosemide (LASIX) 40 MG tablet Take 40 mg by mouth daily as needed.     Marland Kitchen ipratropium (ATROVENT) 0.06 % nasal spray SMARTSIG:2 Spray(s) Both Nares Twice Daily PRN    . lenalidomide (REVLIMID) 10 MG capsule Take 1 capsule (10 mg total) by mouth daily as directed. 21 capsule 10  . magnesium oxide (MAG-OX) 400 MG tablet TAKE ONE TABLET BY MOUTH TWICE DAILY. WHEN TAKING LASIX 60 tablet 2  . metoprolol tartrate (LOPRESSOR) 25 MG tablet TAKE 1 AND 1/2 TABLETS BY MOUTH TWICE DAILY 75 tablet 3  . ondansetron (ZOFRAN-ODT) 4 MG disintegrating tablet Take 4 mg by mouth every 6 (six) hours as needed.     . pantoprazole (PROTONIX) 40 MG tablet Take 1 tablet (40 mg total) by mouth 2 (two) times daily. 60 tablet 0  . polyethylene glycol powder (GLYCOLAX/MIRALAX) 17 GM/SCOOP powder Take by mouth as needed.     . potassium chloride (KLOR-CON) 10 MEQ tablet TAKE ONE TABLET BY MOUTH THREE TIMES DAILY. 90 tablet 4  . umeclidinium-vilanterol (ANORO ELLIPTA) 62.5-25 MCG/INH AEPB Inhale 1 puff into the lungs daily. 60 each 1   No current facility-administered medications for this visit.    PHYSICAL EXAMINATION: ECOG PERFORMANCE STATUS: 1 - Symptomatic but completely ambulatory  Vitals:   06/17/20 0949  BP: (!) 131/53  Pulse: 82  Resp: 18  Temp: 97.7 F (36.5 C)  SpO2: 98%   Filed Weights   06/17/20 0949  Weight: 164 lb 12.8 oz (74.8 kg)      LABORATORY DATA:  I have reviewed the data as listed CMP Latest Ref Rng & Units  06/17/2020 06/08/2020 06/01/2020  Glucose 70 - 99 mg/dL 126(H) 145(H) 92  BUN 8 - 23 mg/dL $Remove'23 23 22  'IqkYHPS$ Creatinine 0.44 - 1.00 mg/dL 1.27(H) 1.27(H) 1.51(H)  Sodium 135 - 145 mmol/L 139 140 140  Potassium 3.5 - 5.1 mmol/L 3.7 3.4(L) 3.8  Chloride 98 - 111 mmol/L 108 108 105  CO2 22 - 32 mmol/L $RemoveB'24 24 27  'kvkHjRKA$ Calcium 8.9 - 10.3 mg/dL 8.1(L) 8.3(L) 8.9  Total Protein 6.5 - 8.1 g/dL 6.1(L) 5.7(L) 6.2(L)  Total Bilirubin 0.3 - 1.2 mg/dL 0.6 0.8 0.6  Alkaline Phos 38 - 126 U/L 133(H) 210(H) 183(H)  AST 15 - 41 U/L 36 144(H) 101(H)  ALT 0 - 44 U/L 52(H) 354(H) 280(H)    Lab Results  Component Value Date   WBC 4.2 06/17/2020   HGB 9.4 (L) 06/17/2020   HCT 30.3 (L) 06/17/2020   MCV 109.8 (H) 06/17/2020   PLT 148 (L) 06/17/2020   NEUTROABS 2.6 06/17/2020    ASSESSMENT & PLAN:  Multiple myeloma ([facility]) 1. 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 -Hospitalized with severe anemia, weakness and pneumonia and shortness of breath on 09/05/2019 through 09/15/2019. -Myeloma labs from 12/12/2019 shows M spike increased to 0.6 g. Kappa light chains increased to 93 and ratio increased to 10.96. -Darzalex single agent started back on 12/09/2019. Pomalidomide discontinued after hospitalization. -Revlimid 10 mg 3 weeks on 1 week off started on 03/01/2020.  2. 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 -H. pylori antibody was positive. Dr. Laural Golden treated her with antibiotics. -Ultrasound of the abdomen on 11/27/2019 showed increased echogenicity of hepatic parenchyma consistent with steatosis. Mild amount of sludge noted in the gallbladder. 10.3 cm bilobed cyst noted in the left kidney.   PLAN:  1. IgG kappa multiple myeloma: -She is taking Revlimid 10 mg 3 weeks on 1 week off.  She is also taking dexamethasone 10 mg weekly. -Shortness of breath: CT PE  protocol 06/01/2020: No PE -I have reviewed her labs.  AST was 36 and ALT is 52   Total bilirubin is 0.6.  Alk phos is 133. -Resume Revlimid and Darzalex.  2. Weight loss: -She lost about 8 pounds in the last 1 month.  3. Myeloma bone disease: -Continue Zometa every 3 months.  4. CKD: -Creatinine elevated at 1.27.  She is taking Lasix as needed.  5. Normocytic anemia: -She has received Feraheme previously.  Hemoglobin today is 9.4   6.  Severe shortness of breath: CT negative for pulmonary embolism Echocardiogram normal ejection fraction 65% 10/15/2019 Her anemia is getting worse and therefore I recommended that she receive 1 unit of PRBC to see if it makes a difference to her breathing. The reason to consider 1 unit of blood transfusion is because of her severity of symptoms.  Return to clinic in 4 weeks for labs, Darzalex infusion and follow-up   No orders of the defined types were placed in this encounter.  The patient has a good understanding of the overall plan. she agrees with it. she will call with any problems that may develop before the next visit here. Total time spent: 60 mins including face to face time and time spent for planning, charting and co-ordination of care   Harriette Ohara, MD 06/17/20

## 2020-06-17 NOTE — Progress Notes (Signed)
Patient presents today for treatment and follow up visit with Dr. Lindi Adie. Verbal orders received to proceed with treatment today. VO received to place patient on the schedule for 1 Unit of PRBC's for shortness of breath. Labs reviewed by MD. Hgb 9.4 today.   Corrected calcium today 8.98 per CJones RPH

## 2020-06-17 NOTE — Patient Instructions (Signed)
[facility] at [facility] Discharge Instructions  You were seen today by Dr. Gudena. Follow up as scheduled.  Thank you for choosing [facility] at [facility] to provide your oncology and hematology care.  To afford each patient quality time with our provider, please arrive at least 15 minutes before your scheduled appointment time.   If you have a lab appointment with the Cancer Center please come in thru the Main Entrance and check in at the main information desk.  You need to re-schedule your appointment should you arrive 10 or more minutes late.  We strive to give you quality time with our providers, and arriving late affects you and other patients whose appointments are after yours.  Also, if you no show three or more times for appointments you may be dismissed from the clinic at the providers discretion.     Again, thank you for choosing [facility].  Our hope is that these requests will decrease the amount of time that you wait before being seen by our physicians.       _____________________________________________________________  Should you have questions after your visit to [facility], please contact our office at [phone] and follow the prompts.  Our office hours are 8:00 a.m. and 4:30 p.m. Monday - Friday.  Please note that voicemails left after 4:00 p.m. may not be returned until the following business day.  We are closed weekends and major holidays.  You do have access to a nurse 24-7, just call the main number to the clinic [phone] and do not press any options, hold on the line and a nurse will answer the phone.    For prescription refill requests, have your pharmacy contact our office and allow 72 hours.    Due to Covid, you will need to wear a mask upon entering the hospital. If you do not have a mask, a mask will be given to you at the Main Entrance upon arrival. For doctor visits, patients may have  1 support person age 18 or older with them. For treatment visits, patients can not have anyone with them due to social distancing guidelines and our immunocompromised population.      

## 2020-06-17 NOTE — Patient Instructions (Signed)
[facility] Discharge Instructions for Patients Receiving Chemotherapy  Today you received the following chemotherapy agents   To help prevent nausea and vomiting after your treatment, we encourage you to take your nausea medication   If you develop nausea and vomiting that is not controlled by your nausea medication, call the clinic.   BELOW ARE SYMPTOMS THAT SHOULD BE REPORTED IMMEDIATELY:  *FEVER GREATER THAN 100.5 F  *CHILLS WITH OR WITHOUT FEVER  NAUSEA AND VOMITING THAT IS NOT CONTROLLED WITH YOUR NAUSEA MEDICATION  *UNUSUAL SHORTNESS OF BREATH  *UNUSUAL BRUISING OR BLEEDING  TENDERNESS IN MOUTH AND THROAT WITH OR WITHOUT PRESENCE OF ULCERS  *URINARY PROBLEMS  *BOWEL PROBLEMS  UNUSUAL RASH Items with * indicate a potential emergency and should be followed up as soon as possible.  Feel free to call the clinic should you have any questions or concerns. The clinic phone number is [phone].  Please show the CHEMO ALERT CARD at check-in to the Emergency Department and triage nurse.   

## 2020-06-17 NOTE — Progress Notes (Signed)
Treatment given today per MD orders.  Tolerated infusion without adverse affects.  B12 injection given per MD orders.  Patient tolerated injection well.  Injection site WNL.  Vital signs stable.  No complaints at this time.  Discharge from clinic ambulatory in stable condition.  Alert and oriented X 3.  Follow up with [facility] as scheduled.

## 2020-06-17 NOTE — Assessment & Plan Note (Signed)
1. 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 -Hospitalized with severe anemia, weakness and pneumonia and shortness of breath on 09/05/2019 through 09/15/2019. -Myeloma labs from 12/12/2019 shows M spike increased to 0.6 g. Kappa light chains increased to 93 and ratio increased to 10.96. -Darzalex single agent started back on 12/09/2019. Pomalidomide discontinued after hospitalization. -Revlimid 10 mg 3 weeks on 1 week off started on 03/01/2020.  2. 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 -H. pylori antibody was positive. Dr. Laural Golden treated her with antibiotics. -Ultrasound of the abdomen on 11/27/2019 showed increased echogenicity of hepatic parenchyma consistent with steatosis. Mild amount of sludge noted in the gallbladder. 10.3 cm bilobed cyst noted in the left kidney.   PLAN:  1. IgG kappa multiple myeloma: -She is taking Revlimid 10 mg 3 weeks on 1 week off.  She is also taking dexamethasone 10 mg weekly. -Shortness of breath: CT PE protocol 06/01/2020: No PE -I have reviewed her labs.  AST was 101 and ALT is elevated 7 times upper limit of normal.  Total bilirubin is normal.  Alk phos is elevated. -Holding Revlimid and Darzalex.  2. Weight loss: -She lost about 8 pounds in the last 1 month.  3. Myeloma bone disease: -Continue Zometa every 3 months.  4. CKD: -Creatinine elevated at 1.51.  She is taking Lasix as needed.  5. Normocytic anemia: -She has received Feraheme with improvement of hemoglobin to 10.6 from 9.6 a month ago.

## 2020-06-17 NOTE — Telephone Encounter (Signed)
Chart reviewed. Okay to resume Revlimid per Dr. Lindi Adie. Prescription refilled.

## 2020-06-18 ENCOUNTER — Other Ambulatory Visit: Payer: Self-pay | Admitting: Cardiology

## 2020-06-18 NOTE — Telephone Encounter (Signed)
Spoke with pt, she does not have any other questions regarding her echo results.

## 2020-06-21 ENCOUNTER — Other Ambulatory Visit (HOSPITAL_COMMUNITY): Payer: Self-pay

## 2020-06-21 ENCOUNTER — Other Ambulatory Visit: Payer: Self-pay

## 2020-06-21 ENCOUNTER — Inpatient Hospital Stay (HOSPITAL_COMMUNITY): Payer: Medicare Other

## 2020-06-21 DIAGNOSIS — D509 Iron deficiency anemia, unspecified: Secondary | ICD-10-CM

## 2020-06-21 DIAGNOSIS — C9000 Multiple myeloma not having achieved remission: Secondary | ICD-10-CM | POA: Diagnosis not present

## 2020-06-21 DIAGNOSIS — D649 Anemia, unspecified: Secondary | ICD-10-CM

## 2020-06-21 LAB — SAMPLE TO BLOOD BANK

## 2020-06-21 LAB — PREPARE RBC (CROSSMATCH)

## 2020-06-22 ENCOUNTER — Other Ambulatory Visit: Payer: Self-pay

## 2020-06-22 ENCOUNTER — Encounter (HOSPITAL_COMMUNITY): Payer: Self-pay

## 2020-06-22 ENCOUNTER — Inpatient Hospital Stay (HOSPITAL_COMMUNITY): Payer: Medicare Other

## 2020-06-22 DIAGNOSIS — D649 Anemia, unspecified: Secondary | ICD-10-CM

## 2020-06-22 DIAGNOSIS — D509 Iron deficiency anemia, unspecified: Secondary | ICD-10-CM

## 2020-06-22 DIAGNOSIS — C9000 Multiple myeloma not having achieved remission: Secondary | ICD-10-CM

## 2020-06-22 MED ORDER — SODIUM CHLORIDE 0.9% IV SOLUTION: 250.0000 mL | Freq: Once | INTRAVENOUS | Status: DC

## 2020-06-22 MED ORDER — SODIUM CHLORIDE 0.9% FLUSH: 10.0000 mL | INTRAVENOUS | Status: DC | PRN

## 2020-06-22 NOTE — Progress Notes (Signed)
Pt took tylenol and benadryl at home at 8 am prior to coming into clinic.   0950 PRBC transfusion started.  Verified by 2 RNs independently.  Unit number [id number] with negative for E antigen, S antigen, Kidd antigen, kell antigen, c antigen.  Rate at 120 mL/he for 30 mL.    1005 vital signs re-checked.  WNL.  No signs or symptoms of reaction. Rate increased to 250 mL/hr.   1106-blood transfusion completed.   Pt tolerated infusion well today without incidence.  Vital signs stable prior to discharge.  Discharged in stable condition ambulatory.

## 2020-06-22 NOTE — Patient Instructions (Signed)
[facility] at [facility]  Discharge Instructions:   _______________________________________________________________  Thank you for choosing [facility] at [facility] to provide your oncology and hematology care.  To afford each patient quality time with our providers, please arrive at least 15 minutes before your scheduled appointment.  You need to re-schedule your appointment if you arrive 10 or more minutes late.  We strive to give you quality time with our providers, and arriving late affects you and other patients whose appointments are after yours.  Also, if you no show three or more times for appointments you may be dismissed from the clinic.  Again, thank you for choosing [facility] at [facility]. Our hope is that these requests will allow you access to exceptional care and in a timely manner. _______________________________________________________________  If you have questions after your visit, please contact our office at [phone] between the hours of 8:30 a.m. and 5:00 p.m. Voicemails left after 4:30 p.m. will not be returned until the following business day. _______________________________________________________________  For prescription refill requests, have your pharmacy contact our office. _______________________________________________________________  Recommendations made by the consultant and any test results will be sent to your referring physician. _______________________________________________________________ 

## 2020-06-25 LAB — BPAM RBC
Blood Product Expiration Date: 202201162359
Blood Product Expiration Date: 202201192359
ISSUE DATE / TIME: 202112132253
ISSUE DATE / TIME: 202112140940
Unit Type and Rh: 5100
Unit Type and Rh: 5100

## 2020-06-25 LAB — TYPE AND SCREEN
ABO/RH(D): O POS
Antibody Screen: POSITIVE
Unit division: 0
Unit division: 0

## 2020-07-15 ENCOUNTER — Inpatient Hospital Stay (HOSPITAL_COMMUNITY): Payer: Medicare Other

## 2020-07-15 ENCOUNTER — Inpatient Hospital Stay (HOSPITAL_COMMUNITY): Payer: Medicare Other | Attending: Hematology

## 2020-07-15 ENCOUNTER — Other Ambulatory Visit: Payer: Self-pay

## 2020-07-15 ENCOUNTER — Inpatient Hospital Stay (HOSPITAL_BASED_OUTPATIENT_CLINIC_OR_DEPARTMENT_OTHER): Payer: Medicare Other | Admitting: Hematology

## 2020-07-15 VITALS — BP 121/48 | HR 62 | Temp 97.4°F | Resp 18 | Wt 162.2 lb

## 2020-07-15 DIAGNOSIS — E785 Hyperlipidemia, unspecified: Secondary | ICD-10-CM | POA: Insufficient documentation

## 2020-07-15 DIAGNOSIS — Z79899 Other long term (current) drug therapy: Secondary | ICD-10-CM | POA: Insufficient documentation

## 2020-07-15 DIAGNOSIS — I2510 Atherosclerotic heart disease of native coronary artery without angina pectoris: Secondary | ICD-10-CM | POA: Diagnosis not present

## 2020-07-15 DIAGNOSIS — Z9221 Personal history of antineoplastic chemotherapy: Secondary | ICD-10-CM | POA: Insufficient documentation

## 2020-07-15 DIAGNOSIS — R5383 Other fatigue: Secondary | ICD-10-CM | POA: Insufficient documentation

## 2020-07-15 DIAGNOSIS — C9000 Multiple myeloma not having achieved remission: Secondary | ICD-10-CM | POA: Diagnosis present

## 2020-07-15 DIAGNOSIS — Z87891 Personal history of nicotine dependence: Secondary | ICD-10-CM | POA: Insufficient documentation

## 2020-07-15 DIAGNOSIS — D509 Iron deficiency anemia, unspecified: Secondary | ICD-10-CM | POA: Diagnosis not present

## 2020-07-15 DIAGNOSIS — I10 Essential (primary) hypertension: Secondary | ICD-10-CM | POA: Diagnosis not present

## 2020-07-15 DIAGNOSIS — I129 Hypertensive chronic kidney disease with stage 1 through stage 4 chronic kidney disease, or unspecified chronic kidney disease: Secondary | ICD-10-CM | POA: Diagnosis not present

## 2020-07-15 DIAGNOSIS — N183 Chronic kidney disease, stage 3 unspecified: Secondary | ICD-10-CM | POA: Diagnosis not present

## 2020-07-15 DIAGNOSIS — Z5112 Encounter for antineoplastic immunotherapy: Secondary | ICD-10-CM | POA: Insufficient documentation

## 2020-07-15 DIAGNOSIS — Z7982 Long term (current) use of aspirin: Secondary | ICD-10-CM | POA: Diagnosis not present

## 2020-07-15 DIAGNOSIS — R630 Anorexia: Secondary | ICD-10-CM | POA: Insufficient documentation

## 2020-07-15 DIAGNOSIS — J449 Chronic obstructive pulmonary disease, unspecified: Secondary | ICD-10-CM | POA: Diagnosis not present

## 2020-07-15 DIAGNOSIS — E538 Deficiency of other specified B group vitamins: Secondary | ICD-10-CM

## 2020-07-15 LAB — COMPREHENSIVE METABOLIC PANEL
ALT: 34 U/L (ref 0–44)
AST: 29 U/L (ref 15–41)
Albumin: 3 g/dL — ABNORMAL LOW (ref 3.5–5.0)
Alkaline Phosphatase: 88 U/L (ref 38–126)
Anion gap: 11 (ref 5–15)
BUN: 49 mg/dL — ABNORMAL HIGH (ref 8–23)
CO2: 22 mmol/L (ref 22–32)
Calcium: 8 mg/dL — ABNORMAL LOW (ref 8.9–10.3)
Chloride: 111 mmol/L (ref 98–111)
Creatinine, Ser: 1.83 mg/dL — ABNORMAL HIGH (ref 0.44–1.00)
GFR, Estimated: 28 mL/min — ABNORMAL LOW (ref >60)
Glucose, Bld: 117 mg/dL — ABNORMAL HIGH (ref 70–99)
Potassium: 3.8 mmol/L (ref 3.5–5.1)
Sodium: 144 mmol/L (ref 135–145)
Total Bilirubin: 0.6 mg/dL (ref 0.3–1.2)
Total Protein: 6.4 g/dL — ABNORMAL LOW (ref 6.5–8.1)

## 2020-07-15 LAB — CBC WITH DIFFERENTIAL/PLATELET
Abs Immature Granulocytes: 0.04 10*3/uL (ref 0.00–0.07)
Basophils Absolute: 0 10*3/uL (ref 0.0–0.1)
Basophils Relative: 0 %
Eosinophils Absolute: 0 10*3/uL (ref 0.0–0.5)
Eosinophils Relative: 0 %
HCT: 34 % — ABNORMAL LOW (ref 36.0–46.0)
Hemoglobin: 10.9 g/dL — ABNORMAL LOW (ref 12.0–15.0)
Immature Granulocytes: 1 %
Lymphocytes Relative: 8 %
Lymphs Abs: 0.5 10*3/uL — ABNORMAL LOW (ref 0.7–4.0)
MCH: 33.1 pg (ref 26.0–34.0)
MCHC: 32.1 g/dL (ref 30.0–36.0)
MCV: 103.3 fL — ABNORMAL HIGH (ref 80.0–100.0)
Monocytes Absolute: 0.9 10*3/uL (ref 0.1–1.0)
Monocytes Relative: 15 %
Neutro Abs: 4.6 10*3/uL (ref 1.7–7.7)
Neutrophils Relative %: 76 %
Platelets: 158 10*3/uL (ref 150–400)
RBC: 3.29 MIL/uL — ABNORMAL LOW (ref 3.87–5.11)
RDW: 17.4 % — ABNORMAL HIGH (ref 11.5–15.5)
WBC: 6.1 10*3/uL (ref 4.0–10.5)
nRBC: 0 % (ref 0.0–0.2)

## 2020-07-15 MED ORDER — ACETAMINOPHEN 325 MG PO TABS
650.0000 mg | ORAL_TABLET | Freq: Once | ORAL | Status: DC
  Filled 2020-07-15: qty 2

## 2020-07-15 MED ORDER — CYANOCOBALAMIN 1000 MCG/ML IJ SOLN
1000.0000 ug | Freq: Once | INTRAMUSCULAR | Status: AC
  Administered 2020-07-15: 1000 ug via INTRAMUSCULAR

## 2020-07-15 MED ORDER — DARATUMUMAB-HYALURONIDASE-FIHJ 1800-30000 MG-UT/15ML ~~LOC~~ SOLN
1800.0000 mg | Freq: Once | SUBCUTANEOUS | Status: AC
  Administered 2020-07-15: 1800 mg via SUBCUTANEOUS
  Filled 2020-07-15: qty 15

## 2020-07-15 MED ORDER — DIPHENHYDRAMINE HCL 25 MG PO CAPS
50.0000 mg | ORAL_CAPSULE | Freq: Once | ORAL | Status: DC
  Filled 2020-07-15: qty 2

## 2020-07-15 MED ORDER — CYANOCOBALAMIN 1000 MCG/ML IJ SOLN
INTRAMUSCULAR | Status: AC
  Filled 2020-07-15: qty 1

## 2020-07-15 MED ORDER — DEXAMETHASONE 4 MG PO TABS
10.0000 mg | ORAL_TABLET | Freq: Once | ORAL | Status: DC
  Filled 2020-07-15: qty 3

## 2020-07-15 NOTE — Patient Instructions (Signed)
[facility] at [facility] Discharge Instructions  You were seen today by Dr. Ellin Saba. He went over your recent results. You received your treatment today. Drink plenty of water to keep your kidneys flushed. Dr. Ellin Saba will see you back in 4 weeks for labs and follow up.   Thank you for choosing [facility] at [facility] to provide your oncology and hematology care.  To afford each patient quality time with our provider, please arrive at least 15 minutes before your scheduled appointment time.   If you have a lab appointment with the Cancer Center please come in thru the Main Entrance and check in at the main information desk  You need to re-schedule your appointment should you arrive 10 or more minutes late.  We strive to give you quality time with our providers, and arriving late affects you and other patients whose appointments are after yours.  Also, if you no show three or more times for appointments you may be dismissed from the clinic at the providers discretion.     Again, thank you for choosing [facility]  Our hope is that these requests will decrease the amount of time that you wait before being seen by our physicians.       _____________________________________________________________  Should you have questions after your visit to [facility], please contact our office at [phone] between the hours of 8:00 a.m. and 4:30 p.m.  Voicemails left after 4:00 p.m. will not be returned until the following business day.  For prescription refill requests, have your pharmacy contact our office and allow 72 hours.    Cancer Center Support Programs:   > Cancer Support Group  2nd Tuesday of the month 1pm-2pm, Journey Room

## 2020-07-15 NOTE — Progress Notes (Signed)
Carol Bradley presents today for injection per the provider's orders.  B12 and Darzalex FasPro administrations without incident; injection sites WNL; see MAR for injection details.  Patient tolerated procedure well and without incident.  No questions or complaints noted at this time.  Discharged ambulatory in stable condition.

## 2020-07-15 NOTE — Progress Notes (Signed)
[place], [place] [address]   CLINIC:  Medical Oncology/Hematology  PCP:  [name], MD [address] / [place] [address] [phone]   REASON FOR VISIT:  Follow-up for multiple myeloma & IDA  PRIOR THERAPY: Darzalex & Pomalyst from 07/28/2019 to 08/25/2019  NGS Results: Not done  CURRENT THERAPY: Darzalex Faspro every 4 weeks; Revlimid 3/4 weeks; intermittent Feraheme last on 05/11/2020  BRIEF ONCOLOGIC HISTORY:  Oncology History  Multiple myeloma ([place])  02/26/2015 Initial Biopsy   BMBX 50% cellularity, igG kappa myeloma, IgG at 3600 mg/dl, FISH with monosomy of chromosome 13, gain 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    03/18/2015 - 07/28/2015 Chemotherapy   Velcade 1.6 mg/m2 discontinued secondary to intolerance   07/28/2015 Adverse Reaction   stool incontinence, weakness, collapse upon standing, felt to be secondary to velcade   11/09/2015 - 12/13/2015 Chemotherapy   cytoxan IV 300 mg/m2 administered X 2 doses only in addition to rev/dex   11/09/2015 - 06/08/2016 Chemotherapy   Revlimid/Dexamethasone    02/05/2017 - 07/09/2017 Chemotherapy   The patient had bortezomib SQ (VELCADE) chemo injection 1.75 mg, 1 mg/m2 = 1.75 mg (66.7 % of original dose 1.5 mg/m2), Subcutaneous,  Once, 1 of 8 cycles Dose modification: 1.5 mg/m2 (original dose 1.5 mg/m2, Cycle 1, Reason: Provider Judgment, Comment: Per Dr. Norma Fredrickson recommendations from [facility] 1 mg/m2 (original dose 1.5 mg/m2, Cycle 1, Reason: Provider Judgment)  for chemotherapy treatment.     07/09/2017 Adverse Reaction   Diarrhea    Chemotherapy   Pomalyst 2 mg (Days 1-21 every 28 days), Ixazomib 2.3 mg (days 1, 8, 15 every 28 days), and Dexamethasone 10 mg (weekly)- Rx's printed on 08/24/2017.  Treatment recommendations from Dr. Norma Fredrickson at [facility].   07/28/2019 -  Chemotherapy   The patient had dexamethasone (DECADRON) tablet 20 mg, 20 mg, Oral, Once, 8 of 10  cycles Dose modification: 10 mg (original dose 10 mg, Cycle 1) Administration: 20 mg (07/28/2019), 10 mg (08/04/2019), 10 mg (08/11/2019), 10 mg (08/25/2019), 10 mg (12/09/2019), 10 mg (12/16/2019), 10 mg (12/23/2019), 10 mg (12/30/2019), 10 mg (01/06/2020), 10 mg (01/13/2020), 10 mg (03/23/2020), 10 mg (06/17/2020) daratumumab-hyaluronidase-fihj (DARZALEX FASPRO) 1800-30000 MG-UT/15ML chemo SQ injection 1,800 mg, 1,800 mg, Subcutaneous,  Once, 8 of 10 cycles Administration: 1,800 mg (07/28/2019), 1,800 mg (08/04/2019), 1,800 mg (08/11/2019), 1,800 mg (08/25/2019), 1,800 mg (12/09/2019), 1,800 mg (12/16/2019), 1,800 mg (12/23/2019), 1,800 mg (12/30/2019), 1,800 mg (01/06/2020), 1,800 mg (01/13/2020), 1,800 mg (05/04/2020), 1,800 mg (02/10/2020), 1,800 mg (02/24/2020), 1,800 mg (01/20/2020), 1,800 mg (01/27/2020), 1,800 mg (03/09/2020), 1,800 mg (03/23/2020), 1,800 mg (04/06/2020), 1,800 mg (04/20/2020), 1,800 mg (06/17/2020)  for chemotherapy treatment.      CANCER STAGING: Cancer Staging No matching staging information was found for the patient.  INTERVAL HISTORY:  Carol Bradley, a 80 y.o. female, returns for routine follow-up and consideration for next cycle of immunotherapy. Carol Bradley was last seen by Dr. Nicholas Lose on 06/17/2020.  Due for cycle #9 of Darzalex Faspro today.   Overall, she tells me she has been feeling pretty well. She tolerated the previous treatment well. She is taking Revlimid for 3 weeks and 1 week off; she currently has 3 tablets left. She denies having diarrhea, abdominal pain, rash or weakness. She continues having SOB and received 1 unit of PRBC on 12/13 and reports that her SOB improved for about 2 weeks. Her appetite is fair and she admits to not drinking enough  water. She reports developing multiple bruises on her forearms the past 2 weeks; she is currently taking ASA 81 QD.  Overall, she feels ready for next cycle of immunotherapy today.    REVIEW OF SYSTEMS:  Review of Systems  Constitutional:  Positive for appetite change (50%) and fatigue (50%).  Respiratory: Positive for shortness of breath (improved w/ PRBC).   Gastrointestinal: Negative for abdominal pain and diarrhea.  Skin: Negative for rash.  Neurological: Negative for extremity weakness.  Hematological: Bruises/bleeds easily (easy bruising for 2 weeks).  All other systems reviewed and are negative.   PAST MEDICAL/SURGICAL HISTORY:  Past Medical History:  Diagnosis Date  . 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 CAD (coronary artery disease)   . COPD (chronic obstructive pulmonary disease) ([place])   . History of tobacco abuse   . Hyperlipidemia   . Hypertension   . 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 Multiple myeloma ([place])   . Vitamin B12 deficiency 04/15/2016   Overview:  Vitamin B12 level documented 155, January 2016 with the normal range being 211-924   Past Surgical History:  Procedure Laterality Date  . BACK SURGERY    . CARDIAC CATHETERIZATION  04/2011   right and left cath showing normal right heart pressures,but newly diagnosed coronary artery disease/drug eluting stent placed to RCA with residual disease in the proximal RCA and LAD and ramus, normal LV function and 60-65% EF  . COLONOSCOPY N/A 09/30/2014   Procedure: COLONOSCOPY;  Surgeon: Rogene Houston, MD;  Location: [facility];  Service: Endoscopy;  Laterality: N/A;  225  . CORONARY ANGIOPLASTY WITH STENT PLACEMENT  04/2011   mid RCA: 3.0 X38 mm Promus DES. Residual 40% disease proximally  . ESOPHAGOGASTRODUODENOSCOPY N/A 09/10/2019   Procedure: ESOPHAGOGASTRODUODENOSCOPY (EGD);  Surgeon: Clarene Essex, MD;  Location: [facility];  Service: Endoscopy;  Laterality: N/A;  . NECK SURGERY      SOCIAL HISTORY:  Social History   Socioeconomic History  . Marital status: Widowed    Spouse name: Not on file  . Number of children: Not on file  . Years of education: Not on file  . Highest education level: Not on file   Occupational History  . 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 Smoking status: Former Smoker    Packs/day: 3.00    Years: 25.00    Pack years: 75.00    Types: Cigarettes    Quit date: 07/10/1994    Years since quitting: 26.0  . Smokeless tobacco: Never Used  Vaping Use  . Vaping Use: Never used  Substance and Sexual Activity  . Alcohol use: No  . Drug use: Never  . Sexual activity: Not on file  Other Topics Concern  . Not on file  Social History Narrative  . Not on file   Social Determinants of Health   Financial Resource Strain: Low Risk   . Difficulty of Paying Living Expenses: Not hard at all  Food Insecurity: No Food Insecurity  . Worried About Charity fundraiser in the Last Year: Never true  . Ran Out of Food in the Last Year: Never true  Transportation Needs: No Transportation Needs  . Lack of Transportation (Medical): No  . Lack of Transportation (Non-Medical): No  Physical Activity: Inactive  . Days of Exercise per Week: 0 days  . Minutes of Exercise per Session: 0 min  Stress: No Stress Concern Present  . Feeling of Stress :  Not at all  Social Connections: Socially Isolated  . Frequency of Communication with Friends and Family: More than three times a week  . Frequency of Social Gatherings with Friends and Family: More than three times a week  . Attends Religious Services: Never  . Active Member of Clubs or Organizations: No  . Attends Archivist Meetings: Never  . Marital Status: Widowed  Intimate Partner Violence: Not At Risk  . Fear of Current or Ex-Partner: No  . Emotionally Abused: No  . Physically Abused: No  . Sexually Abused: No    FAMILY HISTORY:  Family History  Problem Relation Age of Onset  . Heart failure Mother   . Cancer Father     CURRENT MEDICATIONS:  Current Outpatient Medications  Medication Sig Dispense Refill  . albuterol (VENTOLIN HFA) 108 (90 Base) MCG/ACT inhaler Inhale 2 puffs into  the lungs every 6 (six) hours as needed for wheezing or shortness of breath. 18 g 2  . aspirin 325 MG tablet Take 325 mg by mouth at bedtime.    Marland Kitchen atorvastatin (LIPITOR) 10 MG tablet 1 tablet    . Calcium Carbonate-Vit D-Min (CALCIUM 600+D PLUS MINERALS) 600-400 MG-UNIT CHEW Chew 1 tablet by mouth 3 (three) times daily.     . cyanocobalamin (,VITAMIN B-12,) 1000 MCG/ML injection Inject 1,000 mcg into the skin every 30 (thirty) days.     Marland Kitchen dexamethasone (DECADRON) 2 MG tablet Take 5 tablets once weekly. 30 tablet 3  . diltiazem (CARDIZEM CD) 240 MG 24 hr capsule TAKE ONE CAPSULE BY MOUTH EVERY DAY 90 capsule 3  . diltiazem (DILACOR XR) 240 MG 24 hr capsule 1 capsule    . famotidine (PEPCID) 20 MG tablet Take 20 mg by mouth 2 (two) times daily as needed.     . fluticasone (FLONASE) 50 MCG/ACT nasal spray Place 2 sprays into both nostrils as needed.     . furosemide (LASIX) 40 MG tablet Take 40 mg by mouth daily as needed.     Marland Kitchen ipratropium (ATROVENT) 0.06 % nasal spray SMARTSIG:2 Spray(s) Both Nares Twice Daily PRN    . lenalidomide (REVLIMID) 10 MG capsule Take 1 capsule (10 mg total) by mouth daily as directed. 21 capsule 0  . Magnesium 400 MG TABS See admin instructions.    . magnesium oxide (MAG-OX) 400 MG tablet TAKE ONE TABLET BY MOUTH TWICE DAILY. WHEN TAKING LASIX 60 tablet 2  . metoprolol tartrate (LOPRESSOR) 25 MG tablet TAKE 1 AND 1/2 TABLETS BY MOUTH TWICE DAILY 90 tablet 3  . metoprolol-hydrochlorothiazide (LOPRESSOR HCT) 50-25 MG tablet 1 tab 92m    . Misc Natural Products (CRAN-B-OTC PO) VALIUM 558m   . Multiple Minerals-Vitamins (CALCIUM & VIT D3 BONE HEALTH PO) 1 CAPSULE 600MG+ D 400MG    . ondansetron (ZOFRAN-ODT) 4 MG disintegrating tablet Take 4 mg by mouth every 6 (six) hours as needed.     . pantoprazole (PROTONIX) 40 MG tablet Take 1 tablet (40 mg total) by mouth 2 (two) times daily. 60 tablet 0  . polyethylene glycol powder (GLYCOLAX/MIRALAX) 17 GM/SCOOP powder Take by  mouth as needed.     . potassium chloride (KLOR-CON) 10 MEQ tablet TAKE ONE TABLET BY MOUTH THREE TIMES DAILY. 90 tablet 4  . umeclidinium-vilanterol (ANORO ELLIPTA) 62.5-25 MCG/INH AEPB Inhale 1 puff into the lungs daily. 60 each 1   No current facility-administered medications for this visit.    ALLERGIES:  Allergies  Allergen Reactions  . Lisinopril Cough  .  Other     Other reaction(s): Unknown  . Plavix [Clopidogrel Bisulfate] Swelling    PHYSICAL EXAM:  Performance status (ECOG): 1 - Symptomatic but completely ambulatory  Vitals:   07/15/20 1100  BP: (!) 121/48  Pulse: 62  Resp: 18  Temp: (!) 97.4 F (36.3 C)  SpO2: 99%   Wt Readings from Last 3 Encounters:  07/15/20 162 lb 3.2 oz (73.6 kg)  06/17/20 164 lb 12.8 oz (74.8 kg)  06/11/20 160 lb 9.6 oz (72.8 kg)   Physical Exam Vitals reviewed.  Constitutional:      Appearance: Normal appearance.  Cardiovascular:     Rate and Rhythm: Normal rate and regular rhythm.     Pulses: Normal pulses.     Heart sounds: Normal heart sounds.  Pulmonary:     Effort: Pulmonary effort is normal.     Breath sounds: Normal breath sounds.  Abdominal:     Palpations: Abdomen is soft. There is no hepatomegaly or mass.     Tenderness: There is no abdominal tenderness.     Hernia: No hernia is present.  Musculoskeletal:     Right lower leg: No edema.     Left lower leg: No edema.  Skin:    Findings: Bruising (bilat lower posterior forearms) present.  Neurological:     General: No focal deficit present.     Mental Status: She is alert and oriented to person, place, and time.  Psychiatric:        Mood and Affect: Mood normal.        Behavior: Behavior normal.     LABORATORY DATA:  I have reviewed the labs as listed.  CBC Latest Ref Rng & Units 07/15/2020 06/17/2020 06/08/2020  WBC 4.0 - 10.5 K/uL 6.1 4.2 7.0  Hemoglobin 12.0 - 15.0 g/dL 10.9(L) 9.4(L) 9.8(L)  Hematocrit 36.0 - 46.0 % 34.0(L) 30.3(L) 30.8(L)  Platelets 150 -  400 K/uL 158 148(L) 167   CMP Latest Ref Rng & Units 07/15/2020 06/17/2020 06/08/2020  Glucose 70 - 99 mg/dL 117(H) 126(H) 145(H)  BUN 8 - 23 mg/dL 49(H) 23 23  Creatinine 0.44 - 1.00 mg/dL 1.83(H) 1.27(H) 1.27(H)  Sodium 135 - 145 mmol/L 144 139 140  Potassium 3.5 - 5.1 mmol/L 3.8 3.7 3.4(L)  Chloride 98 - 111 mmol/L 111 108 108  CO2 22 - 32 mmol/L _0 Calcium 8.9 - 10.3 mg/dL 8.0(L) 8.1(L) 8.3(L)  Total Protein 6.5 - 8.1 g/dL 6.4(L) 6.1(L) 5.7(L)  Total Bilirubin 0.3 - 1.2 mg/dL 0.6 0.6 0.8  Alkaline Phos 38 - 126 U/L 88 133(H) 210(H)  AST 15 - 41 U/L 29 36 144(H)  ALT 0 - 44 U/L 34 52(H) 354(H)    DIAGNOSTIC IMAGING:  I have independently reviewed the scans and discussed with the patient. ECHOCARDIOGRAM COMPLETE  Result Date: 06/15/2020    ECHOCARDIOGRAM REPORT   Patient Name:   Carol Bradley Date of Exam: 06/15/2020 Medical Rec #:  [id number]    Height:       67.0 in Accession #:    [id number]   Weight:       160.6 lb Date of Birth:  1940/08/30    BSA:          1.842 m Patient Age:    17 years     BP:           137/64 mmHg Patient Gender: F            HR:  89 bpm. Exam Location:  [facility] Procedure: 2D Echo, Cardiac Doppler and Color Doppler Indications:    R06.00 (ICD-10-CM) - Dyspnea on exertion  History:        Patient has prior history of Echocardiogram examinations, most                 recent 09/05/2019. CHF, CAD, COPD; Risk Factors:Hypertension,                 Dyslipidemia and Former Smoker. Chronic renal disease.  Sonographer:    Alvino Chapel RCS Referring Phys: [id number] PETER M [place] IMPRESSIONS  1. Left ventricular ejection fraction, by estimation, is 60 to 65%. The left ventricle has normal function. The left ventricle has no regional wall motion abnormalities. There is moderate left ventricular hypertrophy. Left ventricular diastolic parameters are consistent with Grade I diastolic dysfunction (impaired relaxation).  2. Right ventricular systolic function is normal. The  right ventricular size is normal.  3. Left atrial size was moderately dilated.  4. The mitral valve is normal in structure. Mild to moderate mitral valve regurgitation. No evidence of mitral stenosis.  5. The aortic valve is tricuspid. Aortic valve regurgitation is mild to moderate. No aortic stenosis is present.  6. The inferior vena cava is normal in size with greater than 50% respiratory variability, suggesting right atrial pressure of 3 mmHg. FINDINGS  Left Ventricle: Left ventricular ejection fraction, by estimation, is 60 to 65%. The left ventricle has normal function. The left ventricle has no regional wall motion abnormalities. The left ventricular internal cavity size was normal in size. There is  moderate left ventricular hypertrophy. Left ventricular diastolic parameters are consistent with Grade I diastolic dysfunction (impaired relaxation). Normal left ventricular filling pressure. Right Ventricle: The right ventricular size is normal. No increase in right ventricular wall thickness. Right ventricular systolic function is normal. Left Atrium: Left atrial size was moderately dilated. Right Atrium: Right atrial size was normal in size. Pericardium: There is no evidence of pericardial effusion. Mitral Valve: The mitral valve is normal in structure. Mild to moderate mitral valve regurgitation. No evidence of mitral valve stenosis. Tricuspid Valve: The tricuspid valve is normal in structure. Tricuspid valve regurgitation is mild . No evidence of tricuspid stenosis. Aortic Valve: The aortic valve is tricuspid. Aortic valve regurgitation is mild to moderate. Aortic regurgitation PHT measures 457 msec. No aortic stenosis is present. Aortic valve mean gradient measures 4.0 mmHg. Aortic valve peak gradient measures 7.3 mmHg. Aortic valve area, by VTI measures 2.21 cm. Pulmonic Valve: The pulmonic valve was not well visualized. Pulmonic valve regurgitation is not visualized. No evidence of pulmonic stenosis.  Aorta: The aortic root is normal in size and structure. Pulmonary Artery: Indeterminant PASP, inadequate TR jet. Venous: The inferior vena cava is normal in size with greater than 50% respiratory variability, suggesting right atrial pressure of 3 mmHg. IAS/Shunts: The interatrial septum was not well visualized.  LEFT VENTRICLE PLAX 2D LVIDd:         4.90 cm  Diastology LVIDs:         3.10 cm  LV e' medial:    6.42 cm/s LV PW:         1.20 cm  LV E/e' medial:  13.6 LV IVS:        1.30 cm  LV e' lateral:   8.70 cm/s LVOT diam:     1.90 cm  LV E/e' lateral: 10.0 LV SV:         74 LV  SV Index:   40 LVOT Area:     2.84 cm  RIGHT VENTRICLE RV S prime:     19.80 cm/s TAPSE (M-mode): 1.9 cm LEFT ATRIUM             Index       RIGHT ATRIUM           Index LA diam:        4.50 cm 2.44 cm/m  RA Area:     16.30 cm LA Vol (A2C):   53.0 ml 28.77 ml/m RA Volume:   41.20 ml  22.36 ml/m LA Vol (A4C):   65.0 ml 35.28 ml/m LA Biplane Vol: 63.5 ml 34.47 ml/m  AORTIC VALVE AV Area (Vmax):    2.29 cm AV Area (Vmean):   2.38 cm AV Area (VTI):     2.21 cm AV Vmax:           135.09 cm/s AV Vmean:          92.902 cm/s AV VTI:            0.334 m AV Peak Grad:      7.3 mmHg AV Mean Grad:      4.0 mmHg LVOT Vmax:         109.00 cm/s LVOT Vmean:        78.000 cm/s LVOT VTI:          0.260 m LVOT/AV VTI ratio: 0.78 AI PHT:            457 msec  AORTA Ao Root diam: 3.40 cm MITRAL VALVE MV Area (PHT): 4.29 cm      SHUNTS MV Decel Time: 177 msec      Systemic VTI:  0.26 m MR Peak grad:    114.1 mmHg  Systemic Diam: 1.90 cm MR Mean grad:    80.0 mmHg MR Vmax:         534.00 cm/s MR Vmean:        426.0 cm/s MR PISA:         2.26 cm MR PISA Eff ROA: 14 mm MR PISA Radius:  0.60 cm MV E velocity: 87.30 cm/s MV A velocity: 127.00 cm/s MV E/A ratio:  0.69 Carlyle Dolly MD Electronically signed by Carlyle Dolly MD Signature Date/Time: 06/15/2020/3:03:11 PM    Final      ASSESSMENT:  1. 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 -Hospitalized with severe anemia, weakness and pneumonia and shortness of breath on 09/05/2019 through 09/15/2019. -Myeloma labs from 12/12/2019 shows M spike increased to 0.6 g. Kappa light chains increased to 93 and ratio increased to 10.96. -Darzalex single agent started back on 12/09/2019. Pomalidomide discontinued after hospitalization. -Revlimid 10 mg 3 weeks on 1 week off started on 03/01/2020.  2. 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 -H. pylori antibody was positive. Dr. Laural Golden is starting her on antibiotics. -Ultrasound of the abdomen on 11/27/2019 showed increased echogenicity of hepatic parenchyma consistent with steatosis. Mild amount of sludge noted in the gallbladder. 10.3 cm bilobed cyst noted in the left kidney.   PLAN:  1. IgG kappa multiple myeloma: -She is continuing Revlimid 10 mg 3 weeks on 1 week off.  She is also taking dexamethasone 10 mg weekly. -Previous myeloma labs from 06/01/2020 shows M spike with slight increase of 0.7 g.  Free light chain ratio increased to 92. -We will send  her myeloma labs including SPEP, free light chains today.  2. Weight loss: -Her weight has stabilized.  3. Myeloma bone disease: -Continue Zometa every 3 months.  4. CKD: -Creatinine is elevated at 1.83.  She is taking Lasix as needed.  She also received Zometa at last visit.  5. Normocytic anemia: -She has received blood transfusion on 06/21/2020. -Hemoglobin today is 10.9.  I plan to check ferritin and iron panel at next visit.   Orders placed this encounter:  No orders of the defined types were placed in this encounter.    Derek Jack, MD [place] [phone]   I, Milinda Antis, am acting as a scribe for Dr. Sanda Linger.  I, Derek Jack MD, have reviewed the above  documentation for accuracy and completeness, and I agree with the above.

## 2020-07-15 NOTE — Progress Notes (Signed)
Patient assessed and labs reviewed by Dr. Ellin Saba. Okay to proceed with injection today. Primary RN and pharmacy aware.

## 2020-07-15 NOTE — Progress Notes (Signed)
Pre-medications not given today.  Patient took Benadryl, Tylenol, and Decadron at home prior to treatment.

## 2020-07-16 LAB — PROTEIN ELECTROPHORESIS, SERUM
A/G Ratio: 0.9 (ref 0.7–1.7)
Albumin ELP: 2.9 g/dL (ref 2.9–4.4)
Alpha-1-Globulin: 0.3 g/dL (ref 0.0–0.4)
Alpha-2-Globulin: 0.9 g/dL (ref 0.4–1.0)
Beta Globulin: 0.9 g/dL (ref 0.7–1.3)
Gamma Globulin: 1.2 g/dL (ref 0.4–1.8)
Globulin, Total: 3.3 g/dL (ref 2.2–3.9)
M-Spike, %: 1.1 g/dL — ABNORMAL HIGH
Total Protein ELP: 6.2 g/dL (ref 6.0–8.5)

## 2020-07-16 LAB — KAPPA/LAMBDA LIGHT CHAINS
Kappa free light chain: 450.2 mg/L — ABNORMAL HIGH (ref 3.3–19.4)
Kappa, lambda light chain ratio: 76.31 — ABNORMAL HIGH (ref 0.26–1.65)
Lambda free light chains: 5.9 mg/L (ref 5.7–26.3)

## 2020-07-19 ENCOUNTER — Other Ambulatory Visit (HOSPITAL_COMMUNITY): Payer: Self-pay

## 2020-07-19 DIAGNOSIS — C9000 Multiple myeloma not having achieved remission: Secondary | ICD-10-CM

## 2020-07-19 MED ORDER — LENALIDOMIDE 10 MG PO CAPS: ORAL_CAPSULE | ORAL | 0 refills | Status: DC

## 2020-07-19 NOTE — Telephone Encounter (Signed)
Chart reviewed. Revlimid refilled per Dr. Delton Coombes

## 2020-08-11 ENCOUNTER — Other Ambulatory Visit (HOSPITAL_COMMUNITY): Payer: Self-pay | Admitting: *Deleted

## 2020-08-11 DIAGNOSIS — D509 Iron deficiency anemia, unspecified: Secondary | ICD-10-CM

## 2020-08-11 DIAGNOSIS — C9000 Multiple myeloma not having achieved remission: Secondary | ICD-10-CM

## 2020-08-12 ENCOUNTER — Other Ambulatory Visit: Payer: Self-pay

## 2020-08-12 ENCOUNTER — Inpatient Hospital Stay (HOSPITAL_COMMUNITY): Payer: Medicare Other

## 2020-08-12 ENCOUNTER — Inpatient Hospital Stay (HOSPITAL_BASED_OUTPATIENT_CLINIC_OR_DEPARTMENT_OTHER): Payer: Medicare Other | Admitting: Hematology

## 2020-08-12 ENCOUNTER — Inpatient Hospital Stay (HOSPITAL_COMMUNITY): Payer: Medicare Other | Attending: Hematology

## 2020-08-12 VITALS — BP 126/52 | HR 80 | Temp 97.9°F | Resp 20 | Wt 163.7 lb

## 2020-08-12 DIAGNOSIS — C9000 Multiple myeloma not having achieved remission: Secondary | ICD-10-CM | POA: Diagnosis not present

## 2020-08-12 DIAGNOSIS — Z87891 Personal history of nicotine dependence: Secondary | ICD-10-CM | POA: Insufficient documentation

## 2020-08-12 DIAGNOSIS — R5383 Other fatigue: Secondary | ICD-10-CM | POA: Diagnosis not present

## 2020-08-12 DIAGNOSIS — Q2733 Arteriovenous malformation of digestive system vessel: Secondary | ICD-10-CM | POA: Insufficient documentation

## 2020-08-12 DIAGNOSIS — Z79899 Other long term (current) drug therapy: Secondary | ICD-10-CM | POA: Insufficient documentation

## 2020-08-12 DIAGNOSIS — I2510 Atherosclerotic heart disease of native coronary artery without angina pectoris: Secondary | ICD-10-CM | POA: Diagnosis not present

## 2020-08-12 DIAGNOSIS — D509 Iron deficiency anemia, unspecified: Secondary | ICD-10-CM

## 2020-08-12 DIAGNOSIS — J449 Chronic obstructive pulmonary disease, unspecified: Secondary | ICD-10-CM | POA: Diagnosis not present

## 2020-08-12 DIAGNOSIS — N189 Chronic kidney disease, unspecified: Secondary | ICD-10-CM | POA: Diagnosis not present

## 2020-08-12 DIAGNOSIS — Z792 Long term (current) use of antibiotics: Secondary | ICD-10-CM | POA: Diagnosis not present

## 2020-08-12 DIAGNOSIS — Z5111 Encounter for antineoplastic chemotherapy: Secondary | ICD-10-CM | POA: Diagnosis not present

## 2020-08-12 DIAGNOSIS — K259 Gastric ulcer, unspecified as acute or chronic, without hemorrhage or perforation: Secondary | ICD-10-CM | POA: Diagnosis not present

## 2020-08-12 DIAGNOSIS — R519 Headache, unspecified: Secondary | ICD-10-CM | POA: Insufficient documentation

## 2020-08-12 DIAGNOSIS — H9202 Otalgia, left ear: Secondary | ICD-10-CM | POA: Insufficient documentation

## 2020-08-12 DIAGNOSIS — Z7982 Long term (current) use of aspirin: Secondary | ICD-10-CM | POA: Diagnosis not present

## 2020-08-12 DIAGNOSIS — E785 Hyperlipidemia, unspecified: Secondary | ICD-10-CM | POA: Insufficient documentation

## 2020-08-12 DIAGNOSIS — I10 Essential (primary) hypertension: Secondary | ICD-10-CM | POA: Diagnosis not present

## 2020-08-12 LAB — COMPREHENSIVE METABOLIC PANEL
ALT: 54 U/L — ABNORMAL HIGH (ref 0–44)
AST: 30 U/L (ref 15–41)
Albumin: 2.5 g/dL — ABNORMAL LOW (ref 3.5–5.0)
Alkaline Phosphatase: 87 U/L (ref 38–126)
Anion gap: 10 (ref 5–15)
BUN: 21 mg/dL (ref 8–23)
CO2: 20 mmol/L — ABNORMAL LOW (ref 22–32)
Calcium: 7.8 mg/dL — ABNORMAL LOW (ref 8.9–10.3)
Chloride: 112 mmol/L — ABNORMAL HIGH (ref 98–111)
Creatinine, Ser: 1.53 mg/dL — ABNORMAL HIGH (ref 0.44–1.00)
GFR, Estimated: 34 mL/min — ABNORMAL LOW (ref >60)
Glucose, Bld: 138 mg/dL — ABNORMAL HIGH (ref 70–99)
Potassium: 3.5 mmol/L (ref 3.5–5.1)
Sodium: 142 mmol/L (ref 135–145)
Total Bilirubin: 0.9 mg/dL (ref 0.3–1.2)
Total Protein: 6 g/dL — ABNORMAL LOW (ref 6.5–8.1)

## 2020-08-12 LAB — CBC WITH DIFFERENTIAL/PLATELET
Abs Immature Granulocytes: 0.02 10*3/uL (ref 0.00–0.07)
Basophils Absolute: 0 10*3/uL (ref 0.0–0.1)
Basophils Relative: 1 %
Eosinophils Absolute: 0.3 10*3/uL (ref 0.0–0.5)
Eosinophils Relative: 12 %
HCT: 28.2 % — ABNORMAL LOW (ref 36.0–46.0)
Hemoglobin: 9.1 g/dL — ABNORMAL LOW (ref 12.0–15.0)
Immature Granulocytes: 1 %
Lymphocytes Relative: 22 %
Lymphs Abs: 0.6 10*3/uL — ABNORMAL LOW (ref 0.7–4.0)
MCH: 33.7 pg (ref 26.0–34.0)
MCHC: 32.3 g/dL (ref 30.0–36.0)
MCV: 104.4 fL — ABNORMAL HIGH (ref 80.0–100.0)
Monocytes Absolute: 0.4 10*3/uL (ref 0.1–1.0)
Monocytes Relative: 13 %
Neutro Abs: 1.4 10*3/uL — ABNORMAL LOW (ref 1.7–7.7)
Neutrophils Relative %: 51 %
Platelets: 85 10*3/uL — ABNORMAL LOW (ref 150–400)
RBC: 2.7 MIL/uL — ABNORMAL LOW (ref 3.87–5.11)
RDW: 16.8 % — ABNORMAL HIGH (ref 11.5–15.5)
WBC: 2.6 10*3/uL — ABNORMAL LOW (ref 4.0–10.5)
nRBC: 0 % (ref 0.0–0.2)

## 2020-08-12 LAB — LACTATE DEHYDROGENASE: LDH: 153 U/L (ref 98–192)

## 2020-08-12 LAB — IRON AND TIBC
Iron: 69 ug/dL (ref 28–170)
Saturation Ratios: 33 % — ABNORMAL HIGH (ref 10.4–31.8)
TIBC: 207 ug/dL — ABNORMAL LOW (ref 250–450)
UIBC: 138 ug/dL

## 2020-08-12 LAB — FERRITIN: Ferritin: 424 ng/mL — ABNORMAL HIGH (ref 11–307)

## 2020-08-12 MED ORDER — AMOXICILLIN 500 MG PO CAPS: 500.0000 mg | ORAL_CAPSULE | Freq: Three times a day (TID) | ORAL | 0 refills | Status: DC

## 2020-08-12 NOTE — Progress Notes (Signed)
Per Dr. Delton Coombes, we will hold treatment today. He is changing her regimen to CyBorD and gave her an antibiotic for her ear today. She will follow up next week for treatment.

## 2020-08-12 NOTE — Progress Notes (Signed)
Patient was assessed by Dr. Delton Coombes and labs have been reviewed.  No treatment today. Dr. Raliegh Ip is changing treatment due to progression. Primary RN and pharmacy aware.

## 2020-08-12 NOTE — Progress Notes (Signed)
Carol Bradley, [place] [address]   CLINIC:  Medical Oncology/Hematology  PCP:  Carol Chroman, MD [address] 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    03/18/2015 - 07/28/2015 Chemotherapy   Velcade 1.6 mg/m2 discontinued secondary to intolerance   07/28/2015 Adverse Reaction   stool incontinence, weakness, collapse upon standing, felt to be secondary to velcade   11/09/2015 - 12/13/2015 Chemotherapy   cytoxan IV 300 mg/m2 administered X 2 doses only in addition to rev/dex   11/09/2015 - 06/08/2016 Chemotherapy   Revlimid/Dexamethasone    02/05/2017 - 07/09/2017 Chemotherapy   The patient had bortezomib SQ (VELCADE) chemo injection 1.75 mg, 1 mg/m2 = 1.75 mg (66.7 % of original dose 1.5 mg/m2), Subcutaneous,  Once, 1 of 8 cycles Dose modification: 1.5 mg/m2 (original dose 1.5 mg/m2, Cycle 1, Reason: Provider Judgment, Comment: Per Dr. Norma Bradley recommendations from [facility] 1 mg/m2 (original dose 1.5 mg/m2, Cycle 1, Reason: Provider Judgment)  for chemotherapy treatment.     07/09/2017 Adverse Reaction   Diarrhea    Chemotherapy   Pomalyst 2 mg (Days 1-21 every 28 days), Ixazomib 2.3 mg (days 1, 8, 15 every 28 days), and Dexamethasone 10 mg (weekly)- Rx's printed on 08/24/2017.  Treatment recommendations from Dr. Norma Bradley at [facility].   07/28/2019 - 07/15/2020 Chemotherapy         08/12/2020 -  Chemotherapy    Patient is on Treatment Plan:  MYELOMA CYBORD Q28D X 4 CYCLES        CANCER STAGING: Cancer Staging No matching staging information was found for the patient.  INTERVAL HISTORY:  Carol Bradley, a 80 y.o. female, returns for routine follow-up and consideration for next cycle of immunotherapy. Carol Bradley was last seen on 07/15/2020.  Due for cycle #10 of Darzalex Faspro today.   Overall, she tells me she has been feeling okay. She reports that she went to see ENT for her ear issues and was cleared by Dr. Woody Bradley, but she continues complaining of having headache and ear pain for the past 4-5 days, more in her left ear than the right, and she is frequently dizzy, though she denies having any falls. Her dizziness began with her ear pain. She supposes that the ear pain is due to a sinus infection. She continues taking 5 tablets weekly. She has one more Revlimid tablet left in her bottle. She denies having any nausea presently. She has mild tingling in her feet, but denies any burning sensation.  Her treatment will have to be switched due to progression of her myeloma.   REVIEW OF SYSTEMS:  Review of Systems  Constitutional: Positive for appetite change (75%) and fatigue (50%).  HENT:   Positive for nosebleeds.        Ear pain L more than R ear  Respiratory: Positive for shortness of breath.   Neurological: Positive for dizziness and headaches (8/10 headache pain).  All other systems reviewed and are negative.   PAST MEDICAL/SURGICAL HISTORY:  Past Medical History:  Diagnosis Date  . 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 CAD (coronary artery disease)   . COPD (chronic obstructive pulmonary disease) ([place])   . History of tobacco abuse   . Hyperlipidemia   . Hypertension   . 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 Multiple myeloma ([place])   . Vitamin B12 deficiency 04/15/2016   Overview:  Vitamin B12 level documented 155, January 2016 with the normal range being 211-924   Past Surgical  History:  Procedure Laterality Date  . BACK SURGERY    . CARDIAC CATHETERIZATION  04/2011   right and left cath showing normal right heart pressures,but newly diagnosed coronary artery disease/drug eluting stent placed to RCA with residual disease in the proximal RCA and LAD and ramus, normal LV function and 60-65% EF  . COLONOSCOPY N/A 09/30/2014   Procedure: COLONOSCOPY;  Surgeon: Carol Houston, MD;  [facility];  Service: Endoscopy;  Laterality: N/A;  225  . CORONARY ANGIOPLASTY WITH STENT PLACEMENT  04/2011   mid RCA: 3.0 X38 mm Promus DES. Residual 40% disease proximally  . ESOPHAGOGASTRODUODENOSCOPY N/A 09/10/2019   Procedure: ESOPHAGOGASTRODUODENOSCOPY (EGD);  Surgeon: Carol Essex, MD;  Location: [place];  Service: Endoscopy;  Laterality: N/A;  . NECK SURGERY      SOCIAL HISTORY:  Social History   Socioeconomic History  . Marital status: Widowed    Spouse name: Not on file  . Number of children: Not on file  . Years of education: Not on file  . Highest education level: Not on file  Occupational History  . 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 Smoking status: Former Smoker    Packs/day: 3.00    Years: 25.00    Pack years: 75.00    Types: Cigarettes    Quit date: 07/10/1994    Years since quitting: 26.1  . Smokeless tobacco: Never Used  Vaping Use  . Vaping Use: Never used  Substance and Sexual Activity  . Alcohol use: No  . Drug use: Never  . Sexual activity: Not on file  Other Topics Concern  . Not on file  Social History Narrative  . Not on file   Social Determinants of Health   Financial Resource Strain: Low Risk   . Difficulty of Paying Living Expenses: Not hard at all  Food Insecurity: No Food Insecurity  . Worried About Charity fundraiser in the Last Year: Never true  . Ran Out of Food in the Last Year: Never true  Transportation Needs: No Transportation Needs  . Lack of Transportation (Medical): No  .  Lack of Transportation (Non-Medical): No  Physical Activity: Inactive  . Days of Exercise per Week: 0 days  . Minutes of Exercise per Session: 0 min  Stress: No Stress Concern Present  . Feeling of Stress : Not at all  Social Connections: Socially Isolated  . Frequency of Communication with Friends and Family: More than three times a week  . Frequency of Social Gatherings with Friends and Family: More than three times a week  . Attends Religious Services: Never  . Active Member of Clubs or Organizations: No  . Attends Archivist Meetings: Never  . Marital Status: Widowed  Intimate Partner Violence: Not At Risk  . Fear of Current or Ex-Partner: No  . Emotionally Abused: No  . Physically Abused: No  . Sexually Abused: No    FAMILY HISTORY:  Family History  Problem Relation Age of Onset  . Heart failure Mother   . Cancer Father     CURRENT MEDICATIONS:  Current Outpatient Medications  Medication Sig Dispense Refill  . albuterol (VENTOLIN HFA) 108 (90 Base) MCG/ACT inhaler Inhale 2 puffs into the lungs every 6 (six) hours as needed for wheezing or shortness of breath. 18 g 2  . amoxicillin (AMOXIL) 500 MG capsule Take 1 capsule (500 mg total) by mouth 3 (three) times daily. 21 capsule 0  . aspirin 325 MG tablet Take 325 mg by mouth at bedtime.    Marland Kitchen atorvastatin (LIPITOR) 10 MG tablet 1 tablet    . Calcium Carbonate-Vit D-Min (CALCIUM 600+D PLUS MINERALS) 600-400 MG-UNIT CHEW Chew 1 tablet by mouth 3 (three) times daily.     . cyanocobalamin (,VITAMIN B-12,) 1000 MCG/ML injection Inject 1,000 mcg into the skin every 30 (thirty) days.     Marland Kitchen dexamethasone (DECADRON) 2 MG tablet Take 5 tablets once weekly. 30 tablet 3  . diltiazem (CARDIZEM CD) 240 MG 24 hr capsule TAKE ONE CAPSULE BY MOUTH EVERY DAY 90 capsule 3  . diltiazem (DILACOR XR) 240 MG 24 hr capsule 1 capsule    . famotidine (PEPCID) 20 MG tablet Take 20 mg by mouth 2 (two) times daily as needed.     . fluticasone  (FLONASE) 50 MCG/ACT nasal spray Place 2 sprays into both nostrils as needed.     . furosemide (LASIX) 40 MG tablet Take 40 mg by mouth daily as needed.     Marland Kitchen ipratropium (ATROVENT) 0.06 % nasal spray SMARTSIG:2 Spray(s) Both Nares Twice Daily PRN    . lenalidomide (REVLIMID) 10 MG capsule Take 1 capsule (10 mg total) by mouth daily as directed. 21 capsule 0  . Magnesium 400 MG TABS See admin instructions.    . magnesium oxide (MAG-OX) 400 MG tablet TAKE ONE TABLET BY MOUTH TWICE DAILY. WHEN TAKING LASIX 60 tablet 2  . metoprolol tartrate (LOPRESSOR) 25 MG tablet TAKE 1 AND 1/2 TABLETS BY MOUTH TWICE DAILY 90 tablet 3  . metoprolol-hydrochlorothiazide (LOPRESSOR HCT) 50-25 MG tablet 1 tab $Rem'25mg'yBAk$     . Misc Natural Products (CRAN-B-OTC PO) VALIUM $RemoveBe'5mg'JXCGIxhRS$     . Multiple Minerals-Vitamins (CALCIUM & VIT D3 BONE HEALTH PO) 1 CAPSULE $RemoveB'600MG'NPfOtcIC$ + D $R'400MG'SV$     . pantoprazole (PROTONIX) 40 MG tablet Take 1 tablet (40 mg total) by mouth 2 (two) times daily. 60 tablet 0  . polyethylene glycol powder (GLYCOLAX/MIRALAX) 17 GM/SCOOP powder Take by mouth as needed.     . potassium chloride (KLOR-CON) 10 MEQ tablet TAKE ONE TABLET BY MOUTH THREE TIMES DAILY. 90 tablet 4  . umeclidinium-vilanterol (ANORO ELLIPTA) 62.5-25 MCG/INH AEPB Inhale 1 puff into the lungs daily. 60 each 1  . ondansetron (ZOFRAN-ODT) 4 MG disintegrating tablet Take 4 mg by mouth every 6 (six) hours as needed.  (Patient not taking: Reported on 08/12/2020)     No current facility-administered medications for this visit.    ALLERGIES:  Allergies  Allergen Reactions  . Lisinopril Cough  . Other     Other reaction(s): Unknown  . Plavix [Clopidogrel Bisulfate] Swelling    PHYSICAL EXAM:  Performance status (ECOG): 1 - Symptomatic but completely ambulatory  Vitals:   08/12/20 1331  BP: (!) 126/52  Pulse: 80  Resp: 20  Temp: 97.9 F (36.6 C)  SpO2: 100%   Wt Readings from Last 3 Encounters:  08/12/20 163 lb 11.2 oz (74.3 kg)  07/15/20 162 lb  3.2 oz (73.6 kg)  06/17/20 164 lb 12.8 oz (74.8 kg)   Physical Exam Vitals reviewed.  Constitutional:      Appearance: Normal appearance.     Comments: In wheelchair  Cardiovascular:     Rate and Rhythm: Normal rate and regular rhythm.     Pulses: Normal pulses.     Heart sounds: Normal heart sounds.  Pulmonary:     Effort: Pulmonary effort is normal.     Breath sounds: Normal breath sounds.  Neurological:     General: No focal deficit present.     Mental Status: She is alert and oriented to person, place, and time.  Psychiatric:        Mood and Affect: Mood normal.        Behavior: Behavior normal.     LABORATORY DATA:  I have reviewed the labs as listed.  CBC Latest Ref Rng & Units 08/12/2020 07/15/2020 06/17/2020  WBC 4.0 - 10.5 K/uL 2.6(L) 6.1 4.2  Hemoglobin 12.0 - 15.0 g/dL 9.1(L) 10.9(L) 9.4(L)  Hematocrit 36.0 - 46.0 % 28.2(L) 34.0(L) 30.3(L)  Platelets 150 - 400 K/uL 85(L) 158 148(L)   CMP Latest Ref Rng & Units 08/12/2020 07/15/2020 06/17/2020  Glucose 70 - 99 mg/dL 138(H) 117(H) 126(H)  BUN 8 - 23 mg/dL 21 49(H) 23  Creatinine 0.44 - 1.00 mg/dL 1.53(H) 1.83(H) 1.27(H)  Sodium 135 - 145 mmol/L 142 144 139  Potassium 3.5 - 5.1 mmol/L 3.5 3.8 3.7  Chloride 98 - 111 mmol/L 112(H) 111 108  CO2 22 - 32 mmol/L 20(L) 22 24  Calcium 8.9 - 10.3 mg/dL 7.8(L) 8.0(L) 8.1(L)  Total Protein 6.5 - 8.1 g/dL 6.0(L) 6.4(L) 6.1(L)  Total Bilirubin 0.3 - 1.2 mg/dL 0.9 0.6 0.6  Alkaline Phos 38 - 126 U/L 87 88 133(H)  AST 15 - 41 U/L 30 29 36  ALT 0 - 44 U/L 54(H) 34 52(H)   Lab Results  Component Value Date   LDH 153 08/12/2020   LDH 176 06/17/2020   LDH 151 06/01/2020   Lab Results  Component Value Date   TOTALPROTELP 6.2 07/15/2020   ALBUMINELP 2.9 07/15/2020   A1GS 0.3 07/15/2020   A2GS 0.9 07/15/2020   BETS 0.9 07/15/2020   GAMS 1.2 07/15/2020   MSPIKE 1.1 (H) 07/15/2020   SPEI Comment 07/15/2020    Lab Results  Component Value Date   KPAFRELGTCHN 450.2 (H)  07/15/2020   LAMBDASER 5.9 07/15/2020   KAPLAMBRATIO 76.31 (H) 07/15/2020   Lab Results  Component Value Date   TIBC 261 05/04/2020   TIBC 275 01/06/2020   TIBC 309 08/04/2019   FERRITIN 179 05/04/2020   FERRITIN 23 01/06/2020   FERRITIN 62 08/04/2019   IRONPCTSAT 44 (H) 05/04/2020   IRONPCTSAT 18 01/06/2020   IRONPCTSAT 18 08/04/2019    DIAGNOSTIC IMAGING:  I have independently reviewed the scans and discussed with the patient. No results found.   ASSESSMENT:  1. 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 -Revlimid and dexamethasone from February 2017 through November 2017. -Velcade maintenance from May 2018 through January 2019 discontinued secondary to diarrhea. -Ixazomib, pomalidomide plus dexamethasone started in February 2019 -Dara plus pomalidomide plus dexamethasone from December 2020, last treatment on 08/25/2019 -Hospitalized with severe anemia, weakness and pneumonia and shortness of breath on 09/05/2019 through 09/15/2019. -Darzalex single agent started back on 12/09/2019. Pomalidomide discontinued after hospitalization. -Revlimid 10 mg 3 weeks on 1  week off started on 03/01/2020. -Daratumumab, Revlimid, dexamethasone discontinued on 08/12/2020 due to progression.  2. 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 -H. pylori antibody was positive. Dr. Laural Bradley is starting her on antibiotics. -Ultrasound of the abdomen on 11/27/2019 showed increased echogenicity of hepatic parenchyma consistent with steatosis. Mild amount of sludge noted in the gallbladder. 10.3 cm bilobed cyst noted in the left kidney.   PLAN:  1.  Stage II intermediate risk,IgG kappa multiple myeloma: -Reviewed myeloma labs from 07/15/2020.  M spike increased to 1.1 from 0.7 g. -Free light chains ratio also increased to 76.31.  Kappa light chains are 450. -She has  progression on the current regimen.  Hence I have recommended changing the regimen. -I have also reached out to Dr. Quentin Bradley at [facility].  There is a BCMA based therapy but requires frequent travel to [facility] at least once weekly. -We have reviewed all her previous treatments.  We will try CyBorD as she never received cyclophosphamide and has not received Velcade for a long time. -I discussed the side effects in detail.  She has some residual numbness in the feet which is not hurting.  We will keep a close eye on it while she is on Velcade. -Have told her to discontinue Revlimid.  Plan to initiate treatment next week.  2. Weight loss: -Weight has been stable for the last 2 months.  3. Myeloma bone disease: -Continue Zometa every 3 months.  4. CKD: -Creatinine today is 1.53, improved from 1.83 at last visit.  It slightly worsened after last Zometa.  5. Normocytic anemia: -She received blood transfusion on 06/21/2020.  Last Feraheme was on 05/11/2020. -Hemoglobin today is 9.1.  Ferritin is 424 and percent saturation is 33.  No parenteral iron therapy is needed.  6.  Left ear pain and dizziness: -She reported left ear pain which started 5 days ago.  She had similar problem few weeks ago and was evaluated by ENT and was told that everything was normal. -She took some antibiotic last time which helped. -We will give her amoxicillin 500 mg 3 times a day for 7 days. -If there is no improvement in symptoms, MRI of the brain with and without contrast will be ordered.   Orders placed this encounter:  No orders of the defined types were placed in this encounter.  Total time spent is 40 minutes with more than 50% of the time spent face-to-face discussing disease progression, change in therapy, side effects, counseling and coordination of care.  Carol Jack, MD [place] [phone]   I, Carol Bradley, am acting as a scribe for Dr.  Sanda Bradley.  I, Carol Jack MD, have reviewed the above documentation for accuracy and completeness, and I agree with the above.

## 2020-08-12 NOTE — Progress Notes (Signed)
ON PATHWAY REGIMEN - Multiple Myeloma and Other Plasma Cell Dyscrasias  No Change  Continue With Treatment as Ordered.  Original Decision Date/Time: 07/23/2019 14:27     Cycles 1 and 2: A cycle is every 28 days:     Pomalidomide      Dexamethasone      Daratumumab and hyaluronidase-fihj    Cycles 3 through 6: A cycle is every 28 days:     Pomalidomide      Dexamethasone      Dexamethasone      Daratumumab and hyaluronidase-fihj    Cycles 7 and beyond: A cycle is every 28 days:     Pomalidomide      Dexamethasone      Dexamethasone      Daratumumab and hyaluronidase-fihj   **Always confirm dose/schedule in your pharmacy ordering system**  Patient Characteristics: Relapsed / Refractory, Second through Fourth Lines of Therapy R-ISS Staging: II Disease Classification: Refractory Line of Therapy: Fourth Line Intent of Therapy: Non-Curative / Palliative Intent, Discussed with Patient

## 2020-08-12 NOTE — Progress Notes (Signed)
DISCONTINUE ON PATHWAY REGIMEN - Multiple Myeloma and Other Plasma Cell Dyscrasias     Cycles 1 and 2: A cycle is every 28 days:     Pomalidomide      Dexamethasone      Daratumumab and hyaluronidase-fihj    Cycles 3 through 6: A cycle is every 28 days:     Pomalidomide      Dexamethasone      Dexamethasone      Daratumumab and hyaluronidase-fihj    Cycles 7 and beyond: A cycle is every 28 days:     Pomalidomide      Dexamethasone      Dexamethasone      Daratumumab and hyaluronidase-fihj   **Always confirm dose/schedule in your pharmacy ordering system**  REASON: Disease Progression PRIOR TREATMENT: [id number]: DaraPd - Subcutaneous Daratumumab (Daratumumab/hyaluronidase SUBQ + Pomalidomide + Dexamethasone) Until Progression or Unacceptable Toxicity TREATMENT RESPONSE: Progressive Disease (PD)  START ON PATHWAY REGIMEN - Multiple Myeloma and Other Plasma Cell Dyscrasias     A cycle is every 28 days:     Cyclophosphamide      Dexamethasone      Bortezomib   **Always confirm dose/schedule in your pharmacy ordering system**  Patient Characteristics: Multiple Myeloma, Relapsed / Refractory, Fifth Line and Beyond, Not a Candidate for CAR T-cell Therapy Disease Classification: Multiple Myeloma R-ISS Staging: II Therapeutic Status: Relapsed Line of Therapy: Fifth Line and Beyond Intent of Therapy: Non-Curative / Palliative Intent, Discussed with Patient

## 2020-08-12 NOTE — Patient Instructions (Signed)
[place] at [facility] Discharge Instructions  You were seen today by Dr. Delton Coombes. He went over your recent results. Your treatment will have to be switched to Cytoxan and Velcade, which are given every 4 weeks. Stop taking the Revlimid tablets. You will be prescribed amoxicillin 500 mg three times daily for 7 days for your ear pain. Dr. Delton Coombes will see you back in 1 week for labs and follow up.   Thank you for choosing [place] at [facility] to provide your oncology and hematology care.  To afford each patient quality time with our provider, please arrive at least 15 minutes before your scheduled appointment time.   If you have a lab appointment with the [place] [facility] please come in thru the Main Entrance and check in at the main information desk  You need to re-schedule your appointment should you arrive 10 or more minutes late.  We strive to give you quality time with our providers, and arriving late affects you and other patients whose appointments are after yours.  Also, if you no show three or more times for appointments you may be dismissed from the clinic at the providers discretion.     Again, thank you for choosing [facility].  Our hope is that these requests will decrease the amount of time that you wait before being seen by our physicians.       _____________________________________________________________  Should you have questions after your visit to [facility], please contact our office at [phone] between the hours of 8:00 a.m. and 4:30 p.m.  Voicemails left after 4:00 p.m. will not be returned until the following business day.  For prescription refill requests, have your pharmacy contact our office and allow 72 hours.    Cancer Center Support Programs:   > Cancer Support Group  2nd Tuesday of the month 1pm-2pm, Journey Room

## 2020-08-13 ENCOUNTER — Telehealth: Payer: Self-pay | Admitting: Internal Medicine

## 2020-08-13 ENCOUNTER — Other Ambulatory Visit (HOSPITAL_COMMUNITY): Payer: Self-pay

## 2020-08-13 DIAGNOSIS — D509 Iron deficiency anemia, unspecified: Secondary | ICD-10-CM

## 2020-08-13 DIAGNOSIS — C9000 Multiple myeloma not having achieved remission: Secondary | ICD-10-CM

## 2020-08-13 LAB — KAPPA/LAMBDA LIGHT CHAINS
Kappa free light chain: 578.6 mg/L — ABNORMAL HIGH (ref 3.3–19.4)
Kappa, lambda light chain ratio: 58.44 — ABNORMAL HIGH (ref 0.26–1.65)
Lambda free light chains: 9.9 mg/L (ref 5.7–26.3)

## 2020-08-13 NOTE — Telephone Encounter (Signed)
Pt states she got the Moderna covid vaccines and the booster.

## 2020-08-14 ENCOUNTER — Other Ambulatory Visit (HOSPITAL_COMMUNITY): Payer: Self-pay | Admitting: Hematology

## 2020-08-14 LAB — PROTEIN ELECTROPHORESIS, SERUM
A/G Ratio: 0.9 (ref 0.7–1.7)
Albumin ELP: 2.7 g/dL — ABNORMAL LOW (ref 2.9–4.4)
Alpha-1-Globulin: 0.3 g/dL (ref 0.0–0.4)
Alpha-2-Globulin: 0.8 g/dL (ref 0.4–1.0)
Beta Globulin: 0.8 g/dL (ref 0.7–1.3)
Gamma Globulin: 1 g/dL (ref 0.4–1.8)
Globulin, Total: 3 g/dL (ref 2.2–3.9)
M-Spike, %: 0.9 g/dL — ABNORMAL HIGH
Total Protein ELP: 5.7 g/dL — ABNORMAL LOW (ref 6.0–8.5)

## 2020-08-16 NOTE — Progress Notes (Signed)
.   Pharmacist Chemotherapy Monitoring - Initial Assessment    Anticipated start date: 08/18/20   Regimen:  . Are orders appropriate based on the patient's diagnosis, regimen, and cycle? Yes . Does the plan date match the patient's scheduled date? Yes . Is the sequencing of drugs appropriate? Yes . Are the premedications appropriate for the patient's regimen? Yes . Prior Authorization for treatment is: Approved o If applicable, is the correct biosimilar selected based on the patient's insurance? not applicable  Organ Function and Labs: Marland Kitchen Are dose adjustments needed based on the patient's renal function, hepatic function, or hematologic function? No . Are appropriate labs ordered prior to the start of patient's treatment? Yes . Other organ system assessment, if indicated: N/A . The following baseline labs, if indicated, have been ordered: N/A  Dose Assessment: . Are the drug doses appropriate? Yes . Are the following correct: o Drug concentrations Yes o IV fluid compatible with drug Yes o Administration routes Yes o Timing of therapy Yes . If applicable, does the patient have documented access for treatment and/or plans for port-a-cath placement? no . If applicable, have lifetime cumulative doses been properly documented and assessed? not applicable Lifetime Dose Tracking  No doses have been documented on this patient for the following tracked chemicals: Doxorubicin, Epirubicin, Idarubicin, Daunorubicin, Mitoxantrone, Bleomycin, Oxaliplatin, Carboplatin, Liposomal Doxorubicin  o   Toxicity Monitoring/Prevention: . The patient has the following take home antiemetics prescribed: Ondansetron . The patient has the following take home medications prescribed: N/A . Medication allergies and previous infusion related reactions, if applicable, have been reviewed and addressed. Yes . The patient's current medication list has been assessed for drug-drug interactions with their chemotherapy  regimen. no significant drug-drug interactions were identified on review.  Order Review: . Are the treatment plan orders signed? Yes . Is the patient scheduled to see a provider prior to their treatment? Yes  I verify that I have reviewed each item in the above checklist and answered each question accordingly.  @RPHNAME @

## 2020-08-17 ENCOUNTER — Other Ambulatory Visit (HOSPITAL_COMMUNITY): Payer: Self-pay

## 2020-08-17 DIAGNOSIS — D509 Iron deficiency anemia, unspecified: Secondary | ICD-10-CM

## 2020-08-17 DIAGNOSIS — C9000 Multiple myeloma not having achieved remission: Secondary | ICD-10-CM

## 2020-08-18 ENCOUNTER — Inpatient Hospital Stay (HOSPITAL_BASED_OUTPATIENT_CLINIC_OR_DEPARTMENT_OTHER): Payer: Medicare Other | Admitting: Hematology

## 2020-08-18 ENCOUNTER — Inpatient Hospital Stay (HOSPITAL_COMMUNITY): Payer: Medicare Other

## 2020-08-18 ENCOUNTER — Other Ambulatory Visit: Payer: Self-pay

## 2020-08-18 VITALS — BP 111/59 | HR 78 | Temp 98.0°F | Resp 20 | Wt 158.8 lb

## 2020-08-18 VITALS — BP 104/45 | HR 64 | Temp 96.8°F | Resp 19

## 2020-08-18 DIAGNOSIS — C9000 Multiple myeloma not having achieved remission: Secondary | ICD-10-CM

## 2020-08-18 DIAGNOSIS — D509 Iron deficiency anemia, unspecified: Secondary | ICD-10-CM

## 2020-08-18 DIAGNOSIS — Z5111 Encounter for antineoplastic chemotherapy: Secondary | ICD-10-CM | POA: Diagnosis not present

## 2020-08-18 LAB — COMPREHENSIVE METABOLIC PANEL
ALT: 164 U/L — ABNORMAL HIGH (ref 0–44)
AST: 72 U/L — ABNORMAL HIGH (ref 15–41)
Albumin: 3 g/dL — ABNORMAL LOW (ref 3.5–5.0)
Alkaline Phosphatase: 119 U/L (ref 38–126)
Anion gap: 12 (ref 5–15)
BUN: 46 mg/dL — ABNORMAL HIGH (ref 8–23)
CO2: 19 mmol/L — ABNORMAL LOW (ref 22–32)
Calcium: 8.1 mg/dL — ABNORMAL LOW (ref 8.9–10.3)
Chloride: 105 mmol/L (ref 98–111)
Creatinine, Ser: 1.9 mg/dL — ABNORMAL HIGH (ref 0.44–1.00)
GFR, Estimated: 27 mL/min — ABNORMAL LOW (ref >60)
Glucose, Bld: 224 mg/dL — ABNORMAL HIGH (ref 70–99)
Potassium: 3.6 mmol/L (ref 3.5–5.1)
Sodium: 136 mmol/L (ref 135–145)
Total Bilirubin: 0.8 mg/dL (ref 0.3–1.2)
Total Protein: 6.7 g/dL (ref 6.5–8.1)

## 2020-08-18 LAB — CBC WITH DIFFERENTIAL/PLATELET
Abs Immature Granulocytes: 0.02 10*3/uL (ref 0.00–0.07)
Basophils Absolute: 0 10*3/uL (ref 0.0–0.1)
Basophils Relative: 0 %
Eosinophils Absolute: 0 10*3/uL (ref 0.0–0.5)
Eosinophils Relative: 0 %
HCT: 33.3 % — ABNORMAL LOW (ref 36.0–46.0)
Hemoglobin: 10.5 g/dL — ABNORMAL LOW (ref 12.0–15.0)
Immature Granulocytes: 1 %
Lymphocytes Relative: 13 %
Lymphs Abs: 0.5 10*3/uL — ABNORMAL LOW (ref 0.7–4.0)
MCH: 32.7 pg (ref 26.0–34.0)
MCHC: 31.5 g/dL (ref 30.0–36.0)
MCV: 103.7 fL — ABNORMAL HIGH (ref 80.0–100.0)
Monocytes Absolute: 0.2 10*3/uL (ref 0.1–1.0)
Monocytes Relative: 4 %
Neutro Abs: 3 10*3/uL (ref 1.7–7.7)
Neutrophils Relative %: 82 %
Platelets: 156 10*3/uL (ref 150–400)
RBC: 3.21 MIL/uL — ABNORMAL LOW (ref 3.87–5.11)
RDW: 17.1 % — ABNORMAL HIGH (ref 11.5–15.5)
WBC: 3.6 10*3/uL — ABNORMAL LOW (ref 4.0–10.5)
nRBC: 0 % (ref 0.0–0.2)

## 2020-08-18 MED ORDER — BORTEZOMIB CHEMO SQ INJECTION 3.5 MG (2.5MG/ML)
1.3000 mg/m2 | Freq: Once | INTRAMUSCULAR | Status: AC
Start: 2020-08-18 — End: 2020-08-18
  Administered 2020-08-18: 2.5 mg via SUBCUTANEOUS
  Filled 2020-08-18: qty 1

## 2020-08-18 MED ORDER — SODIUM CHLORIDE 0.9 % IV SOLN
Freq: Once | INTRAVENOUS | Status: AC
  Administered 2020-08-18: 8 mg via INTRAVENOUS
  Filled 2020-08-18: qty 4

## 2020-08-18 MED ORDER — SODIUM CHLORIDE 0.9 % IV SOLN
200.0000 mg/m2 | Freq: Once | INTRAVENOUS | Status: AC
  Administered 2020-08-18: 380 mg via INTRAVENOUS
  Filled 2020-08-18: qty 19

## 2020-08-18 MED ORDER — SODIUM CHLORIDE 0.9 % IV SOLN: Freq: Once | INTRAVENOUS | Status: AC

## 2020-08-18 NOTE — Progress Notes (Signed)
Patient was assessed by Dr. Delton Coombes and labs have been reviewed creatinine 1.90, ALT 164, AST 72.  Okay to proceed with treatment today dose reduced on cytoxan. Patient has already taken dexamethasone 20 mg when she had her labs drawn today.  Primary RN and pharmacy aware.

## 2020-08-18 NOTE — Patient Instructions (Signed)
[facility] Discharge Instructions for Patients Receiving Chemotherapy  Today you received the following chemotherapy agents   To help prevent nausea and vomiting after your treatment, we encourage you to take your nausea medication   If you develop nausea and vomiting that is not controlled by your nausea medication, call the clinic.   BELOW ARE SYMPTOMS THAT SHOULD BE REPORTED IMMEDIATELY:  *FEVER GREATER THAN 100.5 F  *CHILLS WITH OR WITHOUT FEVER  NAUSEA AND VOMITING THAT IS NOT CONTROLLED WITH YOUR NAUSEA MEDICATION  *UNUSUAL SHORTNESS OF BREATH  *UNUSUAL BRUISING OR BLEEDING  TENDERNESS IN MOUTH AND THROAT WITH OR WITHOUT PRESENCE OF ULCERS  *URINARY PROBLEMS  *BOWEL PROBLEMS  UNUSUAL RASH Items with * indicate a potential emergency and should be followed up as soon as possible.  Feel free to call the clinic should you have any questions or concerns. The clinic phone number is [phone].  Please show the CHEMO ALERT CARD at check-in to the Emergency Department and triage nurse.   

## 2020-08-18 NOTE — Progress Notes (Signed)
[place], [place] [address]   CLINIC:  Medical Oncology/Hematology  PCP:  [name], MD [address] / [place] [address] [phone]   REASON FOR VISIT:  Follow-up for multiple myeloma and IDA  PRIOR THERAPY:  1. Velcade x 11 cycles from 02/05/2017 to 07/09/2017. 2. Darzalex Faspro & Pomalyst x 9 cycles from 07/28/2019 to 07/15/2020 with progression.  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    03/18/2015 - 07/28/2015 Chemotherapy   Velcade 1.6 mg/m2 discontinued secondary to intolerance   07/28/2015 Adverse Reaction   stool incontinence, weakness, collapse upon standing, felt to be secondary to velcade   11/09/2015 - 12/13/2015 Chemotherapy   cytoxan IV 300 mg/m2 administered X 2 doses only in addition to rev/dex   11/09/2015 - 06/08/2016 Chemotherapy   Revlimid/Dexamethasone    02/05/2017 - 07/09/2017 Chemotherapy   The patient had bortezomib SQ (VELCADE) chemo injection 1.75 mg, 1 mg/m2 = 1.75 mg (66.7 % of original dose 1.5 mg/m2), Subcutaneous,  Once, 1 of 8 cycles Dose modification: 1.5 mg/m2 (original dose 1.5 mg/m2, Cycle 1, Reason: Provider Judgment, Comment: Per Dr. Norma Fredrickson recommendations from [facility] 1 mg/m2 (original dose 1.5 mg/m2, Cycle 1, Reason: Provider Judgment)  for chemotherapy treatment.     07/09/2017 Adverse Reaction   Diarrhea    Chemotherapy   Pomalyst 2 mg (Days 1-21 every 28 days), Ixazomib 2.3 mg (days 1, 8, 15 every 28 days), and Dexamethasone 10 mg (weekly)- Rx's printed on 08/24/2017.  Treatment recommendations from Dr. Norma Fredrickson at [facility].   07/28/2019 - 07/15/2020 Chemotherapy          08/18/2020 -  Chemotherapy    Patient is on Treatment Plan: MYELOMA CYBORD Q28D X 4 CYCLES        CANCER STAGING: Cancer Staging No matching staging information was found for the patient.  INTERVAL HISTORY:  [name], a 80 y.o. female, returns for routine follow-up and consideration for first cycle of chemotherapy. Carol Bradley was last seen on 08/12/2020.  Due for initiating cycle #1 of CyBorD today.   Overall, she tells me she has been feeling okay. She continues having some left ear pain, though it is improving; she has 2 more days of amoxicillin left. She stopped taking Revlimid, though she continues taking Decadron 10 mg weekly. She continues waking up several times at night. She continues having SOB with minimal activity, though she is able to get around her house. She denies having any numbness in her hands or feet, though she has chronic tingling in her feet. She takes Lasix 40 mg daily as needed for her leg swelling.  Overall, she feels ready for first cycle of chemo today.    REVIEW OF SYSTEMS:  Review of Systems  Constitutional: Positive for appetite change (50%) and fatigue (25%).  Respiratory: Positive for shortness of breath (w/ exertion).   Cardiovascular: Negative for leg swelling.  Neurological: Positive for dizziness, headaches and numbness (chronic tingling in feet).  Psychiatric/Behavioral: Positive for sleep disturbance (waking up multiple times).  All other systems reviewed and are negative.   PAST MEDICAL/SURGICAL HISTORY:  Past Medical History:  Diagnosis Date  . 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 CAD (coronary artery disease)   . COPD (chronic obstructive pulmonary disease) ([place])   . History of tobacco abuse   . Hyperlipidemia   . Hypertension   . 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 Multiple myeloma ([place])   . Vitamin B12 deficiency 04/15/2016   Overview:  Vitamin B12 level documented 155, January 2016 with  the normal range being 211-924   Past Surgical History:  Procedure Laterality Date  . BACK SURGERY    . CARDIAC CATHETERIZATION  04/2011   right and left cath showing normal right heart pressures,but newly diagnosed coronary artery disease/drug eluting stent placed to RCA with residual disease in the proximal RCA and LAD and ramus, normal LV function and 60-65% EF  . COLONOSCOPY N/A 09/30/2014   Procedure: COLONOSCOPY;  Surgeon: Rogene Houston, MD;  [facility];  Service: Endoscopy;  Laterality: N/A;  225  . CORONARY ANGIOPLASTY WITH STENT PLACEMENT  04/2011   mid RCA: 3.0 X38 mm Promus DES. Residual 40% disease proximally  . ESOPHAGOGASTRODUODENOSCOPY N/A 09/10/2019   Procedure: ESOPHAGOGASTRODUODENOSCOPY (EGD);  Surgeon: Clarene Essex, MD;  Location: [facility];  Service: Endoscopy;  Laterality: N/A;  . NECK SURGERY      SOCIAL HISTORY:  Social History   Socioeconomic History  . Marital status: Widowed    Spouse name: Not on file  . Number of children: Not on file  . Years of education: Not on file  . Highest education level: Not on file  Occupational History  . 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 Smoking status: Former Smoker    Packs/day: 3.00    Years: 25.00    Pack years: 75.00    Types: Cigarettes    Quit date: 07/10/1994    Years since quitting: 26.1  . Smokeless tobacco: Never Used  Vaping Use  . Vaping Use: Never used  Substance and Sexual Activity  . Alcohol use: No  . Drug use: Never  . Sexual activity: Not on file  Other Topics Concern  . Not on file  Social History Narrative  . Not on file   Social Determinants of Health   Financial Resource Strain: Low Risk   . Difficulty of Paying Living Expenses: Not hard at all  Food Insecurity: No Food Insecurity  . Worried About Charity fundraiser in the Last Year: Never true  . Ran Out of Food in the Last Year: Never true  Transportation Needs: No Transportation  Needs  . Lack of Transportation (Medical): No  . Lack of Transportation (Non-Medical): No  Physical Activity: Inactive  . Days of Exercise per Week: 0 days  . Minutes of Exercise per Session: 0 min  Stress: No Stress Concern Present  . Feeling of Stress : Not at all  Social Connections: Socially Isolated  . Frequency of Communication with Friends and Family: More than three times a week  . Frequency of Social Gatherings with Friends and Family: More than three times a week  . Attends Religious Services: Never  . Active Member of Clubs or Organizations: No  . Attends Archivist Meetings: Never  . Marital Status: Widowed  Intimate Partner Violence: Not At Risk  . Fear of Current or Ex-Partner: No  . Emotionally Abused: No  . Physically Abused: No  . Sexually Abused: No    FAMILY HISTORY:  Family History  Problem Relation Age of Onset  .  Heart failure Mother   . Cancer Father     CURRENT MEDICATIONS:  Current Outpatient Medications  Medication Sig Dispense Refill  . amoxicillin (AMOXIL) 500 MG capsule Take 1 capsule (500 mg total) by mouth 3 (three) times daily. 21 capsule 0  . aspirin 325 MG tablet Take 325 mg by mouth at bedtime.    Marland Kitchen atorvastatin (LIPITOR) 10 MG tablet 1 tablet    . Calcium Carbonate-Vit D-Min (CALCIUM 600+D PLUS MINERALS) 600-400 MG-UNIT CHEW Chew 1 tablet by mouth 3 (three) times daily.     . cyanocobalamin (,VITAMIN B-12,) 1000 MCG/ML injection Inject 1,000 mcg into the skin every 30 (thirty) days.     Marland Kitchen dexamethasone (DECADRON) 2 MG tablet Take 5 tablets once weekly. 30 tablet 3  . diltiazem (CARDIZEM CD) 240 MG 24 hr capsule TAKE ONE CAPSULE BY MOUTH EVERY DAY 90 capsule 3  . diltiazem (DILACOR XR) 240 MG 24 hr capsule 1 capsule    . famotidine (PEPCID) 20 MG tablet Take 20 mg by mouth 2 (two) times daily as needed.     . fluticasone (FLONASE) 50 MCG/ACT nasal spray Place 2 sprays into both nostrils as needed.     . furosemide (LASIX) 40 MG  tablet Take 40 mg by mouth daily as needed.     Marland Kitchen ipratropium (ATROVENT) 0.06 % nasal spray SMARTSIG:2 Spray(s) Both Nares Twice Daily PRN    . lenalidomide (REVLIMID) 10 MG capsule Take 1 capsule (10 mg total) by mouth daily as directed. 21 capsule 0  . Magnesium 400 MG TABS See admin instructions.    . magnesium oxide (MAG-OX) 400 MG tablet TAKE ONE TABLET BY MOUTH TWICE DAILY. WHEN TAKING LASIX 60 tablet 2  . metoprolol tartrate (LOPRESSOR) 25 MG tablet TAKE 1 AND 1/2 TABLETS BY MOUTH TWICE DAILY 90 tablet 3  . metoprolol-hydrochlorothiazide (LOPRESSOR HCT) 50-25 MG tablet 1 tab $Rem'25mg'Wcbr$     . Misc Natural Products (CRAN-B-OTC PO) VALIUM $RemoveBe'5mg'sTENOXdpb$     . Multiple Minerals-Vitamins (CALCIUM & VIT D3 BONE HEALTH PO) 1 CAPSULE $RemoveB'600MG'WFsCKrkT$ + D $R'400MG'Dj$     . ondansetron (ZOFRAN-ODT) 4 MG disintegrating tablet Take 4 mg by mouth every 6 (six) hours as needed.    . pantoprazole (PROTONIX) 40 MG tablet Take 1 tablet (40 mg total) by mouth 2 (two) times daily. 60 tablet 0  . polyethylene glycol powder (GLYCOLAX/MIRALAX) 17 GM/SCOOP powder Take by mouth as needed.     . potassium chloride (KLOR-CON) 10 MEQ tablet TAKE ONE TABLET BY MOUTH THREE TIMES DAILY. 90 tablet 4  . umeclidinium-vilanterol (ANORO ELLIPTA) 62.5-25 MCG/INH AEPB Inhale 1 puff into the lungs daily. 60 each 1  . albuterol (VENTOLIN HFA) 108 (90 Base) MCG/ACT inhaler Inhale 2 puffs into the lungs every 6 (six) hours as needed for wheezing or shortness of breath. (Patient not taking: Reported on 08/18/2020) 18 g 2   No current facility-administered medications for this visit.    ALLERGIES:  Allergies  Allergen Reactions  . Lisinopril Cough  . Other     Other reaction(s): Unknown  . Plavix [Clopidogrel Bisulfate] Swelling    PHYSICAL EXAM:  Performance status (ECOG): 1 - Symptomatic but completely ambulatory  Vitals:   08/18/20 1255  BP: (!) 111/59  Pulse: 78  Resp: 20  Temp: 98 F (36.7 C)  SpO2: 100%   Wt Readings from Last 3 Encounters:   08/18/20 158 lb 12.8 oz (72 kg)  08/12/20 163 lb 11.2 oz (74.3 kg)  07/15/20 162 lb 3.2 oz (73.6  kg)   Physical Exam Vitals reviewed.  Constitutional:      Appearance: Normal appearance.  Cardiovascular:     Rate and Rhythm: Normal rate and regular rhythm.     Pulses: Normal pulses.     Heart sounds: Normal heart sounds.  Pulmonary:     Effort: Pulmonary effort is normal.     Breath sounds: Normal breath sounds.  Musculoskeletal:     Right lower leg: No edema.     Left lower leg: No edema.  Neurological:     General: No focal deficit present.     Mental Status: She is alert and oriented to person, place, and time.  Psychiatric:        Mood and Affect: Mood normal.        Behavior: Behavior normal.     LABORATORY DATA:  I have reviewed the labs as listed.  CBC Latest Ref Rng & Units 08/18/2020 08/12/2020 07/15/2020  WBC 4.0 - 10.5 K/uL 3.6(L) 2.6(L) 6.1  Hemoglobin 12.0 - 15.0 g/dL 10.5(L) 9.1(L) 10.9(L)  Hematocrit 36.0 - 46.0 % 33.3(L) 28.2(L) 34.0(L)  Platelets 150 - 400 K/uL 156 85(L) 158   CMP Latest Ref Rng & Units 08/18/2020 08/12/2020 07/15/2020  Glucose 70 - 99 mg/dL 224(H) 138(H) 117(H)  BUN 8 - 23 mg/dL 46(H) 21 49(H)  Creatinine 0.44 - 1.00 mg/dL 1.90(H) 1.53(H) 1.83(H)  Sodium 135 - 145 mmol/L 136 142 144  Potassium 3.5 - 5.1 mmol/L 3.6 3.5 3.8  Chloride 98 - 111 mmol/L 105 112(H) 111  CO2 22 - 32 mmol/L 19(L) 20(L) 22  Calcium 8.9 - 10.3 mg/dL 8.1(L) 7.8(L) 8.0(L)  Total Protein 6.5 - 8.1 g/dL 6.7 6.0(L) 6.4(L)  Total Bilirubin 0.3 - 1.2 mg/dL 0.8 0.9 0.6  Alkaline Phos 38 - 126 U/L 119 87 88  AST 15 - 41 U/L 72(H) 30 29  ALT 0 - 44 U/L 164(H) 54(H) 34   Lab Results  Component Value Date   TIBC 207 (L) 08/12/2020   TIBC 261 05/04/2020   TIBC 275 01/06/2020   FERRITIN 424 (H) 08/12/2020   FERRITIN 179 05/04/2020   FERRITIN 23 01/06/2020   IRONPCTSAT 33 (H) 08/12/2020   IRONPCTSAT 44 (H) 05/04/2020   IRONPCTSAT 18 01/06/2020   Lab Results  Component  Value Date   TOTALPROTELP 5.7 (L) 08/12/2020   ALBUMINELP 2.7 (L) 08/12/2020   A1GS 0.3 08/12/2020   A2GS 0.8 08/12/2020   BETS 0.8 08/12/2020   GAMS 1.0 08/12/2020   MSPIKE 0.9 (H) 08/12/2020   SPEI Comment 08/12/2020    Lab Results  Component Value Date   KPAFRELGTCHN 578.6 (H) 08/12/2020   LAMBDASER 9.9 08/12/2020   KAPLAMBRATIO 58.44 (H) 08/12/2020    DIAGNOSTIC IMAGING:  I have independently reviewed the scans and discussed with the patient. No results found.   ASSESSMENT:  1. 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 -Revlimid and dexamethasone from February 2017 through November 2017. -Velcade maintenance from May 2018 through January 2019 discontinued secondary to diarrhea. -Ixazomib, pomalidomide plus dexamethasone started in February 2019 -Dara plus pomalidomide plus dexamethasone from December 2020, last treatment on 08/25/2019 -Hospitalized with severe anemia, weakness and pneumonia and shortness of breath on 09/05/2019 through 09/15/2019. -Darzalex single agent started back on 12/09/2019. Pomalidomide discontinued after hospitalization. -Revlimid 10 mg 3 weeks on 1 week off started on 03/01/2020. -Daratumumab, Revlimid, dexamethasone discontinued on 08/12/2020 due to progression.  2.  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 -H. pylori antibody was positive. Dr. Laural Golden is starting her on antibiotics. -Ultrasound of the abdomen on 11/27/2019 showed increased echogenicity of hepatic parenchyma consistent with steatosis. Mild amount of sludge noted in the gallbladder. 10.3 cm bilobed cyst noted in the left kidney.   PLAN:  1.  Stage II intermediate risk,IgG kappa multiple myeloma: -Her myeloma labs on 08/12/2020 shows M spike 0.9 g.  Kappa light chains was at 578.  Ratio 58. -We discussed changing regimen to CyBorD  regimen. -We discussed side effects in detail.  Today her LFTs are elevated at 72 and 164 of AST and ALT respectively.  Likely from antibiotic. -She will proceed with first dose of CyBorD.  Cyclophosphamide will be dose reduced to 200 mg per metered square and will increase to 300 mg per metered square based on tolerability.  Velcade will be full dose. -We will cut back on dexamethasone to 20 mg weekly on days of treatment.  She will take it at home. -RTC 1 week for follow-up.  2. Weight loss: -Weight has been stable for the last 2 months.  3. Myeloma bone disease: -She is receiving Zometa every 3 months.  Last Zometa 06/17/2020.  4. CKD: -Creatinine was at 1.9.  Differential includes myeloma versus antibiotic.  5. Normocytic anemia: -Hemoglobin today improved to 10.5.  Last blood transfusion was on 06/21/2020.  6.  Left ear pain and dizziness: -She reported slight improvement in left ear pain.  She has 2 more days of amoxicillin.  If no improvement consider imaging with MRI.   Orders placed this encounter:  No orders of the defined types were placed in this encounter.    Derek Jack, MD [place] [phone]   I, Milinda Antis, am acting as a scribe for Dr. Sanda Linger.  I, Derek Jack MD, have reviewed the above documentation for accuracy and completeness, and I agree with the above.

## 2020-08-18 NOTE — Progress Notes (Signed)
Patient presents today for follow up appointment with Dr. Delton Coombes and C1 D1 Cyclophosphamide and Bortezomib. Vital signs within parameters for treatment. BUN 46, Creatinine 1.90, AST 72, ALT 164.   Message received from [place] LPN/ Dr. Delton Coombes to proceed with treatment. Consent obtained. Patient given Cyclophosphamide information pamphlet. Understanding verbalized.   Treatment given today per MD orders. Tolerated infusion without adverse affects. Vital signs stable. No complaints at this time. Discharged from clinic via wheel chair in stable condition. Alert and oriented x 3. F/U with [facility] as scheduled.

## 2020-08-18 NOTE — Patient Instructions (Addendum)
[place] at [facility] Discharge Instructions  You were seen today by Dr. Delton Coombes. He went over your recent results. You received your first treatment today. Continue taking Decadron once weekly, 1 hour before treatment, but stop taking Tylenol and Benadryl. Dr. Delton Coombes will see you back in 1 week for labs and follow up.   Thank you for choosing [place] at [facility] to provide your oncology and hematology care.  To afford each patient quality time with our provider, please arrive at least 15 minutes before your scheduled appointment time.   If you have a lab appointment with the [facility] please come in thru the Main Entrance and check in at the main information desk  You need to re-schedule your appointment should you arrive 10 or more minutes late.  We strive to give you quality time with our providers, and arriving late affects you and other patients whose appointments are after yours.  Also, if you no show three or more times for appointments you may be dismissed from the clinic at the providers discretion.     Again, thank you for choosing [facility].  Our hope is that these requests will decrease the amount of time that you wait before being seen by our physicians.       _____________________________________________________________  Should you have questions after your visit to [facility], please contact our office at [phone] between the hours of 8:00 a.m. and 4:30 p.m.  Voicemails left after 4:00 p.m. will not be returned until the following business day.  For prescription refill requests, have your pharmacy contact our office and allow 72 hours.    Cancer Center Support Programs:   > Cancer Support Group  2nd Tuesday of the month 1pm-2pm, Journey Room

## 2020-08-25 ENCOUNTER — Other Ambulatory Visit (HOSPITAL_COMMUNITY): Payer: Self-pay

## 2020-08-25 ENCOUNTER — Inpatient Hospital Stay (HOSPITAL_COMMUNITY): Payer: Medicare Other

## 2020-08-25 ENCOUNTER — Other Ambulatory Visit: Payer: Self-pay

## 2020-08-25 ENCOUNTER — Inpatient Hospital Stay (HOSPITAL_BASED_OUTPATIENT_CLINIC_OR_DEPARTMENT_OTHER): Payer: Medicare Other | Admitting: Hematology

## 2020-08-25 VITALS — BP 132/82 | HR 92 | Temp 97.0°F | Resp 21 | Wt 152.7 lb

## 2020-08-25 VITALS — BP 128/50 | HR 85 | Temp 97.1°F | Resp 18

## 2020-08-25 DIAGNOSIS — D509 Iron deficiency anemia, unspecified: Secondary | ICD-10-CM

## 2020-08-25 DIAGNOSIS — Z5111 Encounter for antineoplastic chemotherapy: Secondary | ICD-10-CM | POA: Diagnosis not present

## 2020-08-25 DIAGNOSIS — C9000 Multiple myeloma not having achieved remission: Secondary | ICD-10-CM

## 2020-08-25 LAB — COMPREHENSIVE METABOLIC PANEL
ALT: 27 U/L (ref 0–44)
AST: 25 U/L (ref 15–41)
Albumin: 2.7 g/dL — ABNORMAL LOW (ref 3.5–5.0)
Alkaline Phosphatase: 82 U/L (ref 38–126)
Anion gap: 3 — ABNORMAL LOW (ref 5–15)
BUN: 25 mg/dL — ABNORMAL HIGH (ref 8–23)
CO2: 24 mmol/L (ref 22–32)
Calcium: 8 mg/dL — ABNORMAL LOW (ref 8.9–10.3)
Chloride: 110 mmol/L (ref 98–111)
Creatinine, Ser: 1.33 mg/dL — ABNORMAL HIGH (ref 0.44–1.00)
GFR, Estimated: 41 mL/min — ABNORMAL LOW (ref >60)
Glucose, Bld: 115 mg/dL — ABNORMAL HIGH (ref 70–99)
Potassium: 3.6 mmol/L (ref 3.5–5.1)
Sodium: 137 mmol/L (ref 135–145)
Total Bilirubin: 0.6 mg/dL (ref 0.3–1.2)
Total Protein: 5.8 g/dL — ABNORMAL LOW (ref 6.5–8.1)

## 2020-08-25 LAB — CBC WITH DIFFERENTIAL/PLATELET
Abs Immature Granulocytes: 0.03 10*3/uL (ref 0.00–0.07)
Basophils Absolute: 0 10*3/uL (ref 0.0–0.1)
Basophils Relative: 1 %
Eosinophils Absolute: 0.1 10*3/uL (ref 0.0–0.5)
Eosinophils Relative: 3 %
HCT: 30.3 % — ABNORMAL LOW (ref 36.0–46.0)
Hemoglobin: 9.7 g/dL — ABNORMAL LOW (ref 12.0–15.0)
Immature Granulocytes: 1 %
Lymphocytes Relative: 15 %
Lymphs Abs: 0.5 10*3/uL — ABNORMAL LOW (ref 0.7–4.0)
MCH: 34.2 pg — ABNORMAL HIGH (ref 26.0–34.0)
MCHC: 32 g/dL (ref 30.0–36.0)
MCV: 106.7 fL — ABNORMAL HIGH (ref 80.0–100.0)
Monocytes Absolute: 0.3 10*3/uL (ref 0.1–1.0)
Monocytes Relative: 9 %
Neutro Abs: 2.4 10*3/uL (ref 1.7–7.7)
Neutrophils Relative %: 71 %
Platelets: 151 10*3/uL (ref 150–400)
RBC: 2.84 MIL/uL — ABNORMAL LOW (ref 3.87–5.11)
RDW: 19 % — ABNORMAL HIGH (ref 11.5–15.5)
WBC: 3.3 10*3/uL — ABNORMAL LOW (ref 4.0–10.5)
nRBC: 0 % (ref 0.0–0.2)

## 2020-08-25 MED ORDER — SODIUM CHLORIDE 0.9 % IV SOLN
200.0000 mg/m2 | Freq: Once | INTRAVENOUS | Status: AC
  Administered 2020-08-25: 380 mg via INTRAVENOUS
  Filled 2020-08-25: qty 19

## 2020-08-25 MED ORDER — BORTEZOMIB CHEMO SQ INJECTION 3.5 MG (2.5MG/ML)
1.3000 mg/m2 | Freq: Once | INTRAMUSCULAR | Status: AC
  Administered 2020-08-25: 2.5 mg via SUBCUTANEOUS
  Filled 2020-08-25: qty 1

## 2020-08-25 MED ORDER — SODIUM CHLORIDE 0.9 % IV SOLN
Freq: Once | INTRAVENOUS | Status: AC
  Administered 2020-08-25: 8 mg via INTRAVENOUS
  Filled 2020-08-25: qty 4

## 2020-08-25 MED ORDER — ALPRAZOLAM 1 MG PO TABS
1.0000 mg | ORAL_TABLET | Freq: Once | ORAL | 0 refills | Status: AC
Start: 2020-08-25 — End: 2020-08-25

## 2020-08-25 MED ORDER — SODIUM CHLORIDE 0.9 % IV SOLN: Freq: Once | INTRAVENOUS | Status: AC

## 2020-08-25 NOTE — Patient Instructions (Addendum)
[place] at [facility] Discharge Instructions  You were seen today by Dr. Delton Coombes. He went over your recent results. You received your treatment today; continue getting your treatment every week. You will be scheduled to have an MRI of your brain done before your next visit; you will be prescribed Xanax 1 mg to take 1 hour before your scan. Dr. Delton Coombes will see you back in 2 weeks for labs and follow up.   Thank you for choosing [place] at [facility] to provide your oncology and hematology care.  To afford each patient quality time with our provider, please arrive at least 15 minutes before your scheduled appointment time.   If you have a lab appointment with the [facility] please come in thru the Main Entrance and check in at the main information desk  You need to re-schedule your appointment should you arrive 10 or more minutes late.  We strive to give you quality time with our providers, and arriving late affects you and other patients whose appointments are after yours.  Also, if you no show three or more times for appointments you may be dismissed from the clinic at the providers discretion.     Again, thank you for choosing [facility]  Our hope is that these requests will decrease the amount of time that you wait before being seen by our physicians.       _____________________________________________________________  Should you have questions after your visit to [facility], please contact our office at [phone] between the hours of 8:00 a.m. and 4:30 p.m.  Voicemails left after 4:00 p.m. will not be returned until the following business day.  For prescription refill requests, have your pharmacy contact our office and allow 72 hours.    Cancer Center Support Programs:   > Cancer Support Group  2nd Tuesday of the month 1pm-2pm, Journey Room

## 2020-08-25 NOTE — Progress Notes (Signed)
Patient presents today for treatment and follow up visit with Dr. Delton Coombes. Vital signs within parameters for treatment. MAR reviewed. Labs reviewed by MD. Labs within parameters for today's treatment.  Treatment given today per MD orders. Tolerated infusion without adverse affects. Vital signs stable. No complaints at this time. Discharged from via wheel chair in stable condition. Alert and oriented x 3. F/U with [facility] as scheduled.

## 2020-08-25 NOTE — Progress Notes (Signed)
[name], [place] [address]   CLINIC:  Medical Oncology/Hematology  PCP:  [name], MD [address] / [place] [address] [phone]   REASON FOR VISIT:  Follow-up for multiple myeloma & IDA  PRIOR THERAPY:  1. Velcade x 11 cycles from 02/05/2017 to 07/09/2017. 2. Darzalex Faspro & Pomalyst x 9 cycles from 07/28/2019 to 07/15/2020 with progression.  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    03/18/2015 - 07/28/2015 Chemotherapy   Velcade 1.6 mg/m2 discontinued secondary to intolerance   07/28/2015 Adverse Reaction   stool incontinence, weakness, collapse upon standing, felt to be secondary to velcade   11/09/2015 - 12/13/2015 Chemotherapy   cytoxan IV 300 mg/m2 administered X 2 doses only in addition to rev/dex   11/09/2015 - 06/08/2016 Chemotherapy   Revlimid/Dexamethasone    02/05/2017 - 07/09/2017 Chemotherapy   The patient had bortezomib SQ (VELCADE) chemo injection 1.75 mg, 1 mg/m2 = 1.75 mg (66.7 % of original dose 1.5 mg/m2), Subcutaneous,  Once, 1 of 8 cycles Dose modification: 1.5 mg/m2 (original dose 1.5 mg/m2, Cycle 1, Reason: Provider Judgment, Comment: Per Dr. Norma Fredrickson recommendations from [facility] 1 mg/m2 (original dose 1.5 mg/m2, Cycle 1, Reason: Provider Judgment)  for chemotherapy treatment.     07/09/2017 Adverse Reaction   Diarrhea    Chemotherapy   Pomalyst 2 mg (Days 1-21 every 28 days), Ixazomib 2.3 mg (days 1, 8, 15 every 28 days), and Dexamethasone 10 mg (weekly)- Rx's printed on 08/24/2017.  Treatment recommendations from Dr. Norma Fredrickson at [facility].   07/28/2019 - 07/15/2020 Chemotherapy          08/18/2020 -  Chemotherapy    Patient is on Treatment Plan: MYELOMA CYBORD Q28D X 4 CYCLES        CANCER STAGING: Cancer Staging No matching staging information was found for the patient.  INTERVAL HISTORY:  [name], a 80 y.o. female, returns for routine follow-up and consideration for next cycle of chemotherapy. Edit was last seen on 08/18/2020.  Due for day #8 of cycle #1 of CyBorD today.   Overall, she tells me she has been feeling okay. She tolerated the previous treatment well and did not have to take Zofran; she was not constipated after treatment. She was also fatigued for several days after the treatment. Her numbness is stable. She continues having intermittent sharp pains in her left ear which radiate up to her skull. She continues taking Decadron weekly. She denies having melena or hematochezia. She has occasional lightheadedness when she stands up quickly.  Overall, she feels ready for next cycle of chemo today.   Tx today & continue weekly Tx Schedule MRI brain prior to next visit RTC  REVIEW OF SYSTEMS:  Review of Systems  Constitutional: Positive for appetite change (75%) and fatigue (50%).  HENT:         Intermittent L ear pain radiating up in skull  Gastrointestinal: Negative for constipation and nausea.  Neurological: Positive for light-headedness (occasional) and numbness (stable).  All other systems reviewed and are negative.   PAST MEDICAL/SURGICAL HISTORY:  Past Medical History:  Diagnosis Date  . 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 CAD (coronary artery disease)   . COPD (chronic obstructive pulmonary disease) (HCC)   . History of tobacco abuse   . Hyperlipidemia   . Hypertension   . 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 Multiple myeloma (HCC)   . Vitamin B12 deficiency 04/15/2016   Overview:  Vitamin B12 level documented 155, January 2016 with the normal range being 211-924   Past Surgical History:   Procedure Laterality Date  . BACK SURGERY    . CARDIAC CATHETERIZATION  04/2011   right and left cath showing normal right heart pressures,but newly diagnosed coronary artery disease/drug eluting stent placed to RCA with residual disease in the proximal RCA and LAD and ramus, normal LV function and 60-65% EF  . COLONOSCOPY N/A 09/30/2014   Procedure: COLONOSCOPY;  Surgeon: Malissa Hippo, MD;  Location: [facility];  Service: Endoscopy;  Laterality: N/A;  225  . CORONARY ANGIOPLASTY WITH STENT PLACEMENT  04/2011   mid RCA: 3.0 X38 mm Promus DES. Residual 40% disease proximally  . ESOPHAGOGASTRODUODENOSCOPY N/A 09/10/2019   Procedure: ESOPHAGOGASTRODUODENOSCOPY (EGD);  Surgeon: Vida Rigger, MD;  Location: [facility] ENDOSCOPY;  Service: Endoscopy;  Laterality: N/A;  . NECK SURGERY      SOCIAL HISTORY:  Social History   Socioeconomic History  . Marital status: Widowed    Spouse name: Not on file  . Number of children: Not on file  . Years of education: Not on file  . Highest education level: Not on file  Occupational History  . 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 Smoking status: Former Smoker    Packs/day: 3.00    Years: 25.00    Pack years: 75.00    Types: Cigarettes    Quit date: 07/10/1994    Years since quitting: 26.1  . Smokeless tobacco: Never Used  Vaping Use  . Vaping Use: Never used  Substance and Sexual Activity  . Alcohol use: No  . Drug use: Never  . Sexual activity: Not on file  Other Topics Concern  . Not on file  Social History Narrative  . Not on file   Social Determinants of Health   Financial Resource Strain: Low Risk   . Difficulty of Paying Living Expenses: Not hard at all  Food Insecurity: No Food Insecurity  . Worried About Programme researcher, broadcasting/film/video in the Last Year: Never true  . Ran Out of Food in the Last Year: Never true  Transportation Needs: No Transportation Needs  . Lack of Transportation (Medical): No  . Lack of  Transportation (Non-Medical): No  Physical Activity: Inactive  . Days of Exercise per Week: 0 days  . Minutes of Exercise per Session: 0 min  Stress: No Stress Concern Present  . Feeling of Stress : Not at all  Social Connections: Socially Isolated  . Frequency of Communication with Friends and Family: More than three times a week  . Frequency of Social Gatherings with Friends and Family: More than three times a week  . Attends Religious Services: Never  . Active Member of Clubs or Organizations: No  . Attends Banker Meetings: Never  . Marital Status: Widowed  Intimate Partner Violence: Not At Risk  . Fear of Current or Ex-Partner: No  . Emotionally Abused: No  . Physically Abused: No  . Sexually Abused: No    FAMILY HISTORY:  Family History  Problem Relation Age of Onset  . Heart failure Mother   .  Cancer Father     CURRENT MEDICATIONS:  Current Outpatient Medications  Medication Sig Dispense Refill  . albuterol (VENTOLIN HFA) 108 (90 Base) MCG/ACT inhaler Inhale 2 puffs into the lungs every 6 (six) hours as needed for wheezing or shortness of breath. 18 g 2  . amoxicillin (AMOXIL) 500 MG capsule Take 1 capsule (500 mg total) by mouth 3 (three) times daily. 21 capsule 0  . aspirin 325 MG tablet Take 325 mg by mouth at bedtime.    Marland Kitchen atorvastatin (LIPITOR) 10 MG tablet 1 tablet    . Calcium Carbonate-Vit D-Min (CALCIUM 600+D PLUS MINERALS) 600-400 MG-UNIT CHEW Chew 1 tablet by mouth 3 (three) times daily.     . cyanocobalamin (,VITAMIN B-12,) 1000 MCG/ML injection Inject 1,000 mcg into the skin every 30 (thirty) days.     Marland Kitchen dexamethasone (DECADRON) 2 MG tablet Take 5 tablets once weekly. 30 tablet 3  . diltiazem (CARDIZEM CD) 240 MG 24 hr capsule TAKE ONE CAPSULE BY MOUTH EVERY DAY 90 capsule 3  . diltiazem (DILACOR XR) 240 MG 24 hr capsule 1 capsule    . famotidine (PEPCID) 20 MG tablet Take 20 mg by mouth 2 (two) times daily as needed.     . fluticasone  (FLONASE) 50 MCG/ACT nasal spray Place 2 sprays into both nostrils as needed.     . furosemide (LASIX) 40 MG tablet Take 40 mg by mouth daily as needed.     Marland Kitchen ipratropium (ATROVENT) 0.06 % nasal spray SMARTSIG:2 Spray(s) Both Nares Twice Daily PRN    . lenalidomide (REVLIMID) 10 MG capsule Take 1 capsule (10 mg total) by mouth daily as directed. 21 capsule 0  . Magnesium 400 MG TABS See admin instructions.    . magnesium oxide (MAG-OX) 400 MG tablet TAKE ONE TABLET BY MOUTH TWICE DAILY. WHEN TAKING LASIX 60 tablet 2  . meclizine (ANTIVERT) 25 MG tablet Take 25 mg by mouth 3 (three) times daily as needed.    . metoprolol tartrate (LOPRESSOR) 25 MG tablet TAKE 1 AND 1/2 TABLETS BY MOUTH TWICE DAILY 90 tablet 3  . metoprolol-hydrochlorothiazide (LOPRESSOR HCT) 50-25 MG tablet 1 tab $Rem'25mg'KSYc$     . Misc Natural Products (CRAN-B-OTC PO) VALIUM $RemoveBe'5mg'WUcGamveQ$     . Multiple Minerals-Vitamins (CALCIUM & VIT D3 BONE HEALTH PO) 1 CAPSULE $RemoveB'600MG'yEYvvpGU$ + D $R'400MG'nx$     . ondansetron (ZOFRAN) 4 MG tablet Take by mouth.    . ondansetron (ZOFRAN-ODT) 4 MG disintegrating tablet Take 4 mg by mouth every 6 (six) hours as needed.    . pantoprazole (PROTONIX) 40 MG tablet Take 1 tablet (40 mg total) by mouth 2 (two) times daily. 60 tablet 0  . polyethylene glycol powder (GLYCOLAX/MIRALAX) 17 GM/SCOOP powder Take by mouth as needed.     . potassium chloride (KLOR-CON) 10 MEQ tablet TAKE ONE TABLET BY MOUTH THREE TIMES DAILY. 90 tablet 4  . umeclidinium-vilanterol (ANORO ELLIPTA) 62.5-25 MCG/INH AEPB Inhale 1 puff into the lungs daily. 60 each 1   No current facility-administered medications for this visit.    ALLERGIES:  Allergies  Allergen Reactions  . Lisinopril Cough  . Other     Other reaction(s): Unknown  . Plavix [Clopidogrel Bisulfate] Swelling    PHYSICAL EXAM:  Performance status (ECOG): 1 - Symptomatic but completely ambulatory  Vitals:   08/25/20 1145  BP: 132/82  Pulse: 92  Resp: (!) 21  Temp: (!) 97 F (36.1 C)   SpO2: 97%   Wt Readings from Last 3 Encounters:  08/25/20  152 lb 11.2 oz (69.3 kg)  08/18/20 158 lb 12.8 oz (72 kg)  08/12/20 163 lb 11.2 oz (74.3 kg)   Physical Exam Vitals reviewed.  Constitutional:      Appearance: Normal appearance.  Cardiovascular:     Rate and Rhythm: Normal rate and regular rhythm.     Pulses: Normal pulses.     Heart sounds: Normal heart sounds.  Pulmonary:     Effort: Pulmonary effort is normal.     Breath sounds: Normal breath sounds.  Musculoskeletal:     Right lower leg: No edema.     Left lower leg: No edema.  Neurological:     General: No focal deficit present.     Mental Status: She is alert and oriented to person, place, and time.  Psychiatric:        Mood and Affect: Mood normal.        Behavior: Behavior normal.     LABORATORY DATA:  I have reviewed the labs as listed.  CBC Latest Ref Rng & Units 08/18/2020 08/12/2020 07/15/2020  WBC 4.0 - 10.5 K/uL 3.6(L) 2.6(L) 6.1  Hemoglobin 12.0 - 15.0 g/dL 10.5(L) 9.1(L) 10.9(L)  Hematocrit 36.0 - 46.0 % 33.3(L) 28.2(L) 34.0(L)  Platelets 150 - 400 K/uL 156 85(L) 158   CMP Latest Ref Rng & Units 08/18/2020 08/12/2020 07/15/2020  Glucose 70 - 99 mg/dL 224(H) 138(H) 117(H)  BUN 8 - 23 mg/dL 46(H) 21 49(H)  Creatinine 0.44 - 1.00 mg/dL 1.90(H) 1.53(H) 1.83(H)  Sodium 135 - 145 mmol/L 136 142 144  Potassium 3.5 - 5.1 mmol/L 3.6 3.5 3.8  Chloride 98 - 111 mmol/L 105 112(H) 111  CO2 22 - 32 mmol/L 19(L) 20(L) 22  Calcium 8.9 - 10.3 mg/dL 8.1(L) 7.8(L) 8.0(L)  Total Protein 6.5 - 8.1 g/dL 6.7 6.0(L) 6.4(L)  Total Bilirubin 0.3 - 1.2 mg/dL 0.8 0.9 0.6  Alkaline Phos 38 - 126 U/L 119 87 88  AST 15 - 41 U/L 72(H) 30 29  ALT 0 - 44 U/L 164(H) 54(H) 34    DIAGNOSTIC IMAGING:  I have independently reviewed the scans and discussed with the patient. No results found.   ASSESSMENT:  1. 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 -Revlimid and dexamethasone from February 2017 through November 2017. -Velcade maintenance from May 2018 through January 2019 discontinued secondary to diarrhea. -Ixazomib,pomalidomide plus dexamethasone started in February 2019 -Dara plus pomalidomide plus dexamethasone from December 2020, last treatment on 08/25/2019 -Hospitalized with severe anemia, weakness and pneumonia and shortness of breath on 09/05/2019 through 09/15/2019. -Darzalex single agent started back on 12/09/2019. Pomalidomide discontinued after hospitalization. -Revlimid 10 mg 3 weeks on 1 week off started on 03/01/2020. -Daratumumab, Revlimid, dexamethasone discontinued on 08/12/2020 due to progression.  2. 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 -H. pylori antibody was positive. Dr. Laural Golden is starting her on antibiotics. -Ultrasound of the abdomen on 11/27/2019 showed increased echogenicity of hepatic parenchyma consistent with steatosis. Mild amount of sludge noted in the gallbladder. 10.3 cm bilobed cyst noted in the left kidney.   PLAN:  1.Stage II intermediate risk,IgG kappa multiple myeloma: -She started CyBorD regimen on 08/18/2020. -She felt slightly weak and had occasional nausea but denied any vomiting. Reviewed labs today which showed creatinine improved to 1.33.  Hemoglobin is 9.7 and white count is 3.3 with ANC of 2.4.  Platelet count is normal. -She will  proceed with day 8 of CyBorD today.  I will maintain the dose of Cytoxan 200 mg/m2 at this time.  Likely increased to 300 mg/m2 during cycle 2. -RTC 2 weeks for follow-up.  2. Weight loss: -Her appetite is good.  Weight is stable.  3. Myeloma bone disease: -Last Zometa on 06/17/2020.  She receives it every 3 months.  4. CKD: -Her creatinine has improved to 1.33 today from 1.9 last week.  5. Normocytic anemia: -Hemoglobin today is 9.7.  He does not require any  transfusion.  6. Left ear pain and dizziness: -She still has left ear pain occasionally and left-sided headaches. -She completed amoxicillin without improvement. -We will order MRI of the brain without contrast.  We will premedicate with 1 mg of Xanax 1 hour prior to the scan.   Orders placed this encounter:  No orders of the defined types were placed in this encounter.    Derek Jack, MD [place] [phone]   I, Milinda Antis, am acting as a scribe for Dr. Sanda Linger.  I, Derek Jack MD, have reviewed the above documentation for accuracy and completeness, and I agree with the above.

## 2020-08-25 NOTE — Patient Instructions (Signed)
[facility] Discharge Instructions for Patients Receiving Chemotherapy  Today you received the following chemotherapy agents   To help prevent nausea and vomiting after your treatment, we encourage you to take your nausea medication   If you develop nausea and vomiting that is not controlled by your nausea medication, call the clinic.   BELOW ARE SYMPTOMS THAT SHOULD BE REPORTED IMMEDIATELY:  *FEVER GREATER THAN 100.5 F  *CHILLS WITH OR WITHOUT FEVER  NAUSEA AND VOMITING THAT IS NOT CONTROLLED WITH YOUR NAUSEA MEDICATION  *UNUSUAL SHORTNESS OF BREATH  *UNUSUAL BRUISING OR BLEEDING  TENDERNESS IN MOUTH AND THROAT WITH OR WITHOUT PRESENCE OF ULCERS  *URINARY PROBLEMS  *BOWEL PROBLEMS  UNUSUAL RASH Items with * indicate a potential emergency and should be followed up as soon as possible.  Feel free to call the clinic should you have any questions or concerns. The clinic phone number is [phone].  Please show the CHEMO ALERT CARD at check-in to the Emergency Department and triage nurse.   

## 2020-08-25 NOTE — Progress Notes (Signed)
Patient was assessed by Dr. Katragadda and labs have been reviewed. Treatment pending labs. Primary RN and pharmacy aware.   

## 2020-09-01 ENCOUNTER — Inpatient Hospital Stay (HOSPITAL_COMMUNITY): Payer: Medicare Other

## 2020-09-01 ENCOUNTER — Other Ambulatory Visit: Payer: Self-pay

## 2020-09-01 DIAGNOSIS — C9000 Multiple myeloma not having achieved remission: Secondary | ICD-10-CM

## 2020-09-01 DIAGNOSIS — Z5111 Encounter for antineoplastic chemotherapy: Secondary | ICD-10-CM | POA: Diagnosis not present

## 2020-09-01 LAB — CBC WITH DIFFERENTIAL/PLATELET
Abs Immature Granulocytes: 0.06 10*3/uL (ref 0.00–0.07)
Basophils Absolute: 0 10*3/uL (ref 0.0–0.1)
Basophils Relative: 0 %
Eosinophils Absolute: 0 10*3/uL (ref 0.0–0.5)
Eosinophils Relative: 0 %
HCT: 29.9 % — ABNORMAL LOW (ref 36.0–46.0)
Hemoglobin: 9.5 g/dL — ABNORMAL LOW (ref 12.0–15.0)
Immature Granulocytes: 2 %
Lymphocytes Relative: 7 %
Lymphs Abs: 0.3 10*3/uL — ABNORMAL LOW (ref 0.7–4.0)
MCH: 33.6 pg (ref 26.0–34.0)
MCHC: 31.8 g/dL (ref 30.0–36.0)
MCV: 105.7 fL — ABNORMAL HIGH (ref 80.0–100.0)
Monocytes Absolute: 0.4 10*3/uL (ref 0.1–1.0)
Monocytes Relative: 10 %
Neutro Abs: 3.1 10*3/uL (ref 1.7–7.7)
Neutrophils Relative %: 81 %
Platelets: 110 10*3/uL — ABNORMAL LOW (ref 150–400)
RBC: 2.83 MIL/uL — ABNORMAL LOW (ref 3.87–5.11)
RDW: 18.7 % — ABNORMAL HIGH (ref 11.5–15.5)
WBC: 3.8 10*3/uL — ABNORMAL LOW (ref 4.0–10.5)
nRBC: 0 % (ref 0.0–0.2)

## 2020-09-01 LAB — COMPREHENSIVE METABOLIC PANEL
ALT: 290 U/L — ABNORMAL HIGH (ref 0–44)
AST: 111 U/L — ABNORMAL HIGH (ref 15–41)
Albumin: 2.7 g/dL — ABNORMAL LOW (ref 3.5–5.0)
Alkaline Phosphatase: 197 U/L — ABNORMAL HIGH (ref 38–126)
Anion gap: 9 (ref 5–15)
BUN: 23 mg/dL (ref 8–23)
CO2: 25 mmol/L (ref 22–32)
Calcium: 8.4 mg/dL — ABNORMAL LOW (ref 8.9–10.3)
Chloride: 103 mmol/L (ref 98–111)
Creatinine, Ser: 1.39 mg/dL — ABNORMAL HIGH (ref 0.44–1.00)
GFR, Estimated: 39 mL/min — ABNORMAL LOW (ref >60)
Glucose, Bld: 116 mg/dL — ABNORMAL HIGH (ref 70–99)
Potassium: 3.3 mmol/L — ABNORMAL LOW (ref 3.5–5.1)
Sodium: 137 mmol/L (ref 135–145)
Total Bilirubin: 1.4 mg/dL — ABNORMAL HIGH (ref 0.3–1.2)
Total Protein: 6.2 g/dL — ABNORMAL LOW (ref 6.5–8.1)

## 2020-09-01 NOTE — Progress Notes (Signed)
Patient presents today for Velcade and Cytoxan.  Vital signs within parameters for treatment.  Labs reviewed and AST  111 and ALT 290.  Dr. Delton Coombes notified.  No treatment today due to elevated liver enzymes per Dr. Delton Coombes.  No complaints at this time.  Discharge from clinic ambulatory in stable condition.  Alert and oriented X 3.  Follow up with [facility] as scheduled.

## 2020-09-03 ENCOUNTER — Other Ambulatory Visit (HOSPITAL_COMMUNITY): Payer: Self-pay | Admitting: *Deleted

## 2020-09-03 MED ORDER — POTASSIUM CHLORIDE CRYS ER 10 MEQ PO TBCR: 10.0000 meq | EXTENDED_RELEASE_TABLET | Freq: Three times a day (TID) | ORAL | 4 refills | Status: AC

## 2020-09-07 ENCOUNTER — Ambulatory Visit (HOSPITAL_COMMUNITY)
Admission: RE | Admit: 2020-09-07 | Discharge: 2020-09-07 | Disposition: A | Discharge status: Home / Self Care | Payer: Medicare Other | Source: Ambulatory Visit | Attending: Hematology | Admitting: Hematology

## 2020-09-07 ENCOUNTER — Other Ambulatory Visit: Payer: Self-pay

## 2020-09-07 DIAGNOSIS — D509 Iron deficiency anemia, unspecified: Secondary | ICD-10-CM | POA: Insufficient documentation

## 2020-09-07 DIAGNOSIS — C9000 Multiple myeloma not having achieved remission: Secondary | ICD-10-CM | POA: Insufficient documentation

## 2020-09-08 ENCOUNTER — Other Ambulatory Visit (HOSPITAL_COMMUNITY): Payer: Medicare Other

## 2020-09-08 ENCOUNTER — Inpatient Hospital Stay (HOSPITAL_COMMUNITY): Payer: Medicare Other

## 2020-09-08 ENCOUNTER — Inpatient Hospital Stay (HOSPITAL_COMMUNITY): Payer: Medicare Other | Attending: Hematology

## 2020-09-08 ENCOUNTER — Inpatient Hospital Stay (HOSPITAL_BASED_OUTPATIENT_CLINIC_OR_DEPARTMENT_OTHER): Payer: Medicare Other | Admitting: Hematology

## 2020-09-08 ENCOUNTER — Ambulatory Visit (HOSPITAL_COMMUNITY): Payer: Medicare Other

## 2020-09-08 ENCOUNTER — Ambulatory Visit (HOSPITAL_COMMUNITY): Payer: Medicare Other | Admitting: Hematology

## 2020-09-08 ENCOUNTER — Other Ambulatory Visit: Payer: Self-pay

## 2020-09-08 VITALS — BP 116/56 | HR 86 | Temp 97.0°F | Resp 18 | Wt 165.6 lb

## 2020-09-08 VITALS — BP 113/46 | HR 84 | Temp 97.1°F | Resp 17

## 2020-09-08 DIAGNOSIS — J449 Chronic obstructive pulmonary disease, unspecified: Secondary | ICD-10-CM | POA: Diagnosis not present

## 2020-09-08 DIAGNOSIS — I129 Hypertensive chronic kidney disease with stage 1 through stage 4 chronic kidney disease, or unspecified chronic kidney disease: Secondary | ICD-10-CM | POA: Insufficient documentation

## 2020-09-08 DIAGNOSIS — M26603 Bilateral temporomandibular joint disorder, unspecified: Secondary | ICD-10-CM | POA: Insufficient documentation

## 2020-09-08 DIAGNOSIS — Z7982 Long term (current) use of aspirin: Secondary | ICD-10-CM | POA: Insufficient documentation

## 2020-09-08 DIAGNOSIS — Z5111 Encounter for antineoplastic chemotherapy: Secondary | ICD-10-CM | POA: Insufficient documentation

## 2020-09-08 DIAGNOSIS — R7989 Other specified abnormal findings of blood chemistry: Secondary | ICD-10-CM | POA: Diagnosis not present

## 2020-09-08 DIAGNOSIS — E538 Deficiency of other specified B group vitamins: Secondary | ICD-10-CM

## 2020-09-08 DIAGNOSIS — C9000 Multiple myeloma not having achieved remission: Secondary | ICD-10-CM

## 2020-09-08 DIAGNOSIS — D509 Iron deficiency anemia, unspecified: Secondary | ICD-10-CM | POA: Insufficient documentation

## 2020-09-08 DIAGNOSIS — D631 Anemia in chronic kidney disease: Secondary | ICD-10-CM | POA: Insufficient documentation

## 2020-09-08 DIAGNOSIS — I2510 Atherosclerotic heart disease of native coronary artery without angina pectoris: Secondary | ICD-10-CM | POA: Insufficient documentation

## 2020-09-08 DIAGNOSIS — N189 Chronic kidney disease, unspecified: Secondary | ICD-10-CM | POA: Diagnosis present

## 2020-09-08 DIAGNOSIS — R42 Dizziness and giddiness: Secondary | ICD-10-CM | POA: Insufficient documentation

## 2020-09-08 DIAGNOSIS — E611 Iron deficiency: Secondary | ICD-10-CM | POA: Diagnosis not present

## 2020-09-08 DIAGNOSIS — Z79899 Other long term (current) drug therapy: Secondary | ICD-10-CM | POA: Insufficient documentation

## 2020-09-08 DIAGNOSIS — Z87891 Personal history of nicotine dependence: Secondary | ICD-10-CM | POA: Insufficient documentation

## 2020-09-08 DIAGNOSIS — E785 Hyperlipidemia, unspecified: Secondary | ICD-10-CM | POA: Diagnosis not present

## 2020-09-08 DIAGNOSIS — H9203 Otalgia, bilateral: Secondary | ICD-10-CM | POA: Insufficient documentation

## 2020-09-08 LAB — CBC WITH DIFFERENTIAL/PLATELET
Abs Immature Granulocytes: 0.07 10*3/uL (ref 0.00–0.07)
Basophils Absolute: 0 10*3/uL (ref 0.0–0.1)
Basophils Relative: 0 %
Eosinophils Absolute: 0.1 10*3/uL (ref 0.0–0.5)
Eosinophils Relative: 1 %
HCT: 32.7 % — ABNORMAL LOW (ref 36.0–46.0)
Hemoglobin: 10.2 g/dL — ABNORMAL LOW (ref 12.0–15.0)
Immature Granulocytes: 1 %
Lymphocytes Relative: 12 %
Lymphs Abs: 0.7 10*3/uL (ref 0.7–4.0)
MCH: 34.1 pg — ABNORMAL HIGH (ref 26.0–34.0)
MCHC: 31.2 g/dL (ref 30.0–36.0)
MCV: 109.4 fL — ABNORMAL HIGH (ref 80.0–100.0)
Monocytes Absolute: 0.7 10*3/uL (ref 0.1–1.0)
Monocytes Relative: 13 %
Neutro Abs: 4.1 10*3/uL (ref 1.7–7.7)
Neutrophils Relative %: 73 %
Platelets: 264 10*3/uL (ref 150–400)
RBC: 2.99 MIL/uL — ABNORMAL LOW (ref 3.87–5.11)
RDW: 18.9 % — ABNORMAL HIGH (ref 11.5–15.5)
WBC: 5.6 10*3/uL (ref 4.0–10.5)
nRBC: 0 % (ref 0.0–0.2)

## 2020-09-08 LAB — COMPREHENSIVE METABOLIC PANEL
ALT: 45 U/L — ABNORMAL HIGH (ref 0–44)
AST: 24 U/L (ref 15–41)
Albumin: 3 g/dL — ABNORMAL LOW (ref 3.5–5.0)
Alkaline Phosphatase: 149 U/L — ABNORMAL HIGH (ref 38–126)
Anion gap: 10 (ref 5–15)
BUN: 33 mg/dL — ABNORMAL HIGH (ref 8–23)
CO2: 26 mmol/L (ref 22–32)
Calcium: 8.1 mg/dL — ABNORMAL LOW (ref 8.9–10.3)
Chloride: 102 mmol/L (ref 98–111)
Creatinine, Ser: 1.48 mg/dL — ABNORMAL HIGH (ref 0.44–1.00)
GFR, Estimated: 36 mL/min — ABNORMAL LOW (ref >60)
Glucose, Bld: 145 mg/dL — ABNORMAL HIGH (ref 70–99)
Potassium: 3.6 mmol/L (ref 3.5–5.1)
Sodium: 138 mmol/L (ref 135–145)
Total Bilirubin: 0.7 mg/dL (ref 0.3–1.2)
Total Protein: 6.5 g/dL (ref 6.5–8.1)

## 2020-09-08 MED ORDER — SODIUM CHLORIDE 0.9 % IV SOLN: INTRAVENOUS | Status: DC

## 2020-09-08 MED ORDER — SODIUM CHLORIDE 0.9 % IV SOLN
200.0000 mg/m2 | Freq: Once | INTRAVENOUS | Status: AC
  Administered 2020-09-08: 380 mg via INTRAVENOUS
  Filled 2020-09-08: qty 19

## 2020-09-08 MED ORDER — CYANOCOBALAMIN 1000 MCG/ML IJ SOLN
1000.0000 ug | Freq: Once | INTRAMUSCULAR | Status: AC
  Administered 2020-09-08: 1000 ug via INTRAMUSCULAR
  Filled 2020-09-08: qty 1

## 2020-09-08 MED ORDER — BORTEZOMIB CHEMO SQ INJECTION 3.5 MG (2.5MG/ML)
1.3000 mg/m2 | Freq: Once | INTRAMUSCULAR | Status: AC
  Administered 2020-09-08: 2.5 mg via SUBCUTANEOUS
  Filled 2020-09-08: qty 1

## 2020-09-08 MED ORDER — SODIUM CHLORIDE 0.9 % IV SOLN
Freq: Once | INTRAVENOUS | Status: AC
  Administered 2020-09-08: 8 mg via INTRAVENOUS
  Filled 2020-09-08: qty 4

## 2020-09-08 NOTE — Progress Notes (Signed)
[place] [place], [place] [address]   CLINIC:  Medical Oncology/Hematology  PCP:  [name], MD [address] / [place] [address] [phone]   REASON FOR VISIT:  Follow-up for multiple myeloma & IDA  PRIOR THERAPY:  1. Velcade x 11 cycles from 02/05/2017 to 07/09/2017. 2. Darzalex Faspro & Pomalyst x 9 cycles from 07/28/2019 to 07/15/2020 with progression.  NGS Results: Not done  CURRENT THERAPY: CyBorD weekly; intermittent Feraheme last on 05/11/2020  BRIEF ONCOLOGIC HISTORY:  Oncology History  Multiple myeloma ([place])  02/26/2015 Initial Biopsy   BMBX 50% cellularity, igG kappa myeloma, IgG at 3600 mg/dl, FISH with monosomy of chromosome 13, gain 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    03/18/2015 - 07/28/2015 Chemotherapy   Velcade 1.6 mg/m2 discontinued secondary to intolerance   07/28/2015 Adverse Reaction   stool incontinence, weakness, collapse upon standing, felt to be secondary to velcade   11/09/2015 - 12/13/2015 Chemotherapy   cytoxan IV 300 mg/m2 administered X 2 doses only in addition to rev/dex   11/09/2015 - 06/08/2016 Chemotherapy   Revlimid/Dexamethasone    02/05/2017 - 07/09/2017 Chemotherapy   The patient had bortezomib SQ (VELCADE) chemo injection 1.75 mg, 1 mg/m2 = 1.75 mg (66.7 % of original dose 1.5 mg/m2), Subcutaneous,  Once, 1 of 8 cycles Dose modification: 1.5 mg/m2 (original dose 1.5 mg/m2, Cycle 1, Reason: Provider Judgment, Comment: Per Dr. Norma Fredrickson recommendations from [facility] 1 mg/m2 (original dose 1.5 mg/m2, Cycle 1, Reason: Provider Judgment)  for chemotherapy treatment.     07/09/2017 Adverse Reaction   Diarrhea    Chemotherapy   Pomalyst 2 mg (Days 1-21 every 28 days), Ixazomib 2.3 mg (days 1, 8, 15 every 28 days), and Dexamethasone 10 mg (weekly)- Rx's printed on 08/24/2017.  Treatment recommendations from [facility]rickson at [facility].   07/28/2019 - 07/15/2020 Chemotherapy          08/18/2020 -  Chemotherapy    Patient is on Treatment Plan: MYELOMA CYBORD Q28D X 4 CYCLES        CANCER STAGING: Cancer Staging No matching staging information was found for the patient.  INTERVAL HISTORY:  Carol Bradley, a 80 y.o. female, returns for routine follow-up and consideration for next cycle of chemotherapy. Carol Bradley was last seen on 08/25/2020.  Due for day #22 of cycle #1 of CyBorD today.   Today she is accompanied by her son. Overall, she tells me she has been feeling okay. She tolerated the previous treatment well. She is taking Tylenol infrequently for her pain, once every 2 weeks, and takes melatonin as needed to help with sleep. She was seeing Dr. Benjamine Mola for her left ear issues but nothing was ever discovered; she continues having intermittent ear ache but denies having discharge. Her appetite is okay and denies N/V/D, and her energy levels are decreased. She is unstable when she stands. She denies having any numbness or tingling. She denies having any new pains.  Overall, she feels ready for next cycle of chemo today.    REVIEW OF SYSTEMS:  Review of Systems  Constitutional: Positive for appetite change (50%) and fatigue (50%).  HENT:         Ear pain but no discharge  Gastrointestinal: Negative for diarrhea, nausea and vomiting.  Musculoskeletal: Positive for gait problem (unstable). Negative for arthralgias and myalgias.  Neurological: Positive for gait problem (unstable). Negative for numbness.  Psychiatric/Behavioral: Positive for sleep disturbance (on melatonin).  All other  systems reviewed and are negative.   PAST MEDICAL/SURGICAL HISTORY:  Past Medical History:  Diagnosis Date  . 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 CAD (coronary artery disease)   . COPD (chronic obstructive pulmonary disease) ([place])   . History of tobacco abuse   . Hyperlipidemia   . Hypertension   . 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 Multiple  myeloma ([place])   . Vitamin B12 deficiency 04/15/2016   Overview:  Vitamin B12 level documented 155, January 2016 with the normal range being 211-924   Past Surgical History:  Procedure Laterality Date  . BACK SURGERY    . CARDIAC CATHETERIZATION  04/2011   right and left cath showing normal right heart pressures,but newly diagnosed coronary artery disease/drug eluting stent placed to RCA with residual disease in the proximal RCA and LAD and ramus, normal LV function and 60-65% EF  . COLONOSCOPY N/A 09/30/2014   Procedure: COLONOSCOPY;  Surgeon: Rogene Houston, MD;  [facility];  Service: Endoscopy;  Laterality: N/A;  225  . CORONARY ANGIOPLASTY WITH STENT PLACEMENT  04/2011   mid RCA: 3.0 X38 mm Promus DES. Residual 40% disease proximally  . ESOPHAGOGASTRODUODENOSCOPY N/A 09/10/2019   Procedure: ESOPHAGOGASTRODUODENOSCOPY (EGD);  Surgeon: Clarene Essex, MD;  Location: [facility];  Service: Endoscopy;  Laterality: N/A;  . NECK SURGERY      SOCIAL HISTORY:  Social History   Socioeconomic History  . Marital status: Widowed    Spouse name: Not on file  . Number of children: Not on file  . Years of education: Not on file  . Highest education level: Not on file  Occupational History  . 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 Smoking status: Former Smoker    Packs/day: 3.00    Years: 25.00    Pack years: 75.00    Types: Cigarettes    Quit date: 07/10/1994    Years since quitting: 26.1  . Smokeless tobacco: Never Used  Vaping Use  . Vaping Use: Never used  Substance and Sexual Activity  . Alcohol use: No  . Drug use: Never  . Sexual activity: Not on file  Other Topics Concern  . Not on file  Social History Narrative  . Not on file   Social Determinants of Health   Financial Resource Strain: Low Risk   . Difficulty of Paying Living Expenses: Not hard at all  Food Insecurity: No Food Insecurity  . Worried About Charity fundraiser in the  Last Year: Never true  . Ran Out of Food in the Last Year: Never true  Transportation Needs: No Transportation Needs  . Lack of Transportation (Medical): No  . Lack of Transportation (Non-Medical): No  Physical Activity: Inactive  . Days of Exercise per Week: 0 days  . Minutes of Exercise per Session: 0 min  Stress: No Stress Concern Present  . Feeling of Stress : Not at all  Social Connections: Socially Isolated  . Frequency of Communication with Friends and Family: More than three times a week  . Frequency of Social Gatherings with Friends and Family: More than three times a week  . Attends Religious Services: Never  . Active Member of Clubs or Organizations: No  . Attends Archivist Meetings: Never  . Marital Status: Widowed  Intimate Partner Violence: Not At Risk  . Fear of Current or Ex-Partner: No  . Emotionally Abused: No  . Physically Abused: No  .  Sexually Abused: No    FAMILY HISTORY:  Family History  Problem Relation Age of Onset  . Heart failure Mother   . Cancer Father     CURRENT MEDICATIONS:  Current Outpatient Medications  Medication Sig Dispense Refill  . albuterol (VENTOLIN HFA) 108 (90 Base) MCG/ACT inhaler Inhale 2 puffs into the lungs every 6 (six) hours as needed for wheezing or shortness of breath. 18 g 2  . ALPRAZolam (XANAX) 1 MG tablet Take 1 mg by mouth once.    Marland Kitchen amoxicillin (AMOXIL) 500 MG capsule Take 1 capsule (500 mg total) by mouth 3 (three) times daily. 21 capsule 0  . aspirin 325 MG tablet Take 325 mg by mouth at bedtime.    Marland Kitchen atorvastatin (LIPITOR) 10 MG tablet 1 tablet    . Calcium Carbonate-Vit D-Min (CALCIUM 600+D PLUS MINERALS) 600-400 MG-UNIT CHEW Chew 1 tablet by mouth 3 (three) times daily.     . cyanocobalamin (,VITAMIN B-12,) 1000 MCG/ML injection Inject 1,000 mcg into the skin every 30 (thirty) days.     Marland Kitchen dexamethasone (DECADRON) 2 MG tablet Take 5 tablets once weekly. 30 tablet 3  . diltiazem (CARDIZEM CD) 240 MG 24  hr capsule TAKE ONE CAPSULE BY MOUTH EVERY DAY 90 capsule 3  . diltiazem (DILACOR XR) 240 MG 24 hr capsule 1 capsule    . famotidine (PEPCID) 20 MG tablet Take 20 mg by mouth 2 (two) times daily as needed.     . fluticasone (FLONASE) 50 MCG/ACT nasal spray Place 2 sprays into both nostrils as needed.     . furosemide (LASIX) 40 MG tablet Take 40 mg by mouth daily as needed.     Marland Kitchen ipratropium (ATROVENT) 0.06 % nasal spray SMARTSIG:2 Spray(s) Both Nares Twice Daily PRN    . lenalidomide (REVLIMID) 10 MG capsule Take 1 capsule (10 mg total) by mouth daily as directed. 21 capsule 0  . Magnesium 400 MG TABS See admin instructions.    . magnesium oxide (MAG-OX) 400 MG tablet TAKE ONE TABLET BY MOUTH TWICE DAILY. WHEN TAKING LASIX 60 tablet 2  . meclizine (ANTIVERT) 25 MG tablet Take 25 mg by mouth 3 (three) times daily as needed.    . metoprolol tartrate (LOPRESSOR) 25 MG tablet TAKE 1 AND 1/2 TABLETS BY MOUTH TWICE DAILY 90 tablet 3  . metoprolol-hydrochlorothiazide (LOPRESSOR HCT) 50-25 MG tablet 1 tab 57m    . Misc Natural Products (CRAN-B-OTC PO) VALIUM 568m   . Multiple Minerals-Vitamins (CALCIUM & VIT D3 BONE HEALTH PO) 1 CAPSULE 600MG+ D 400MG    . ondansetron (ZOFRAN) 4 MG tablet Take by mouth.    . ondansetron (ZOFRAN-ODT) 4 MG disintegrating tablet Take 4 mg by mouth every 6 (six) hours as needed.    . pantoprazole (PROTONIX) 40 MG tablet Take 1 tablet (40 mg total) by mouth 2 (two) times daily. 60 tablet 0  . polyethylene glycol powder (GLYCOLAX/MIRALAX) 17 GM/SCOOP powder Take by mouth as needed.     . potassium chloride (KLOR-CON) 10 MEQ tablet Take 1 tablet (10 mEq total) by mouth 3 (three) times daily. 90 tablet 4  . umeclidinium-vilanterol (ANORO ELLIPTA) 62.5-25 MCG/INH AEPB Inhale 1 puff into the lungs daily. 60 each 1   No current facility-administered medications for this visit.    ALLERGIES:  Allergies  Allergen Reactions  . Lisinopril Cough  . Other     Other  reaction(s): Unknown  . Plavix [Clopidogrel Bisulfate] Swelling    PHYSICAL EXAM:  Performance  status (ECOG): 1 - Symptomatic but completely ambulatory  Vitals:   09/08/20 1105  BP: (!) 116/56  Pulse: 86  Resp: 18  Temp: (!) 97 F (36.1 C)  SpO2: 97%   Wt Readings from Last 3 Encounters:  09/08/20 165 lb 9.6 oz (75.1 kg)  09/01/20 164 lb 9.6 oz (74.7 kg)  08/25/20 152 lb 11.2 oz (69.3 kg)   Physical Exam Vitals reviewed.  Constitutional:      Appearance: Normal appearance.     Comments: In wheelchair  HENT:     Left Ear: Tympanic membrane and ear canal normal. No drainage, swelling or tenderness.  No middle ear effusion. There is no impacted cerumen. No foreign body. No hemotympanum. Tympanic membrane is not injected, scarred, perforated, erythematous, retracted or bulging.  Neurological:     General: No focal deficit present.     Mental Status: She is alert and oriented to person, place, and time.  Psychiatric:        Mood and Affect: Mood normal.        Behavior: Behavior normal.     LABORATORY DATA:  I have reviewed the labs as listed.  CBC Latest Ref Rng & Units 09/08/2020 09/01/2020 08/25/2020  WBC 4.0 - 10.5 K/uL 5.6 3.8(L) 3.3(L)  Hemoglobin 12.0 - 15.0 g/dL 10.2(L) 9.5(L) 9.7(L)  Hematocrit 36.0 - 46.0 % 32.7(L) 29.9(L) 30.3(L)  Platelets 150 - 400 K/uL 264 110(L) 151   CMP Latest Ref Rng & Units 09/08/2020 09/01/2020 08/25/2020  Glucose 70 - 99 mg/dL 145(H) 116(H) 115(H)  BUN 8 - 23 mg/dL 33(H) 23 25(H)  Creatinine 0.44 - 1.00 mg/dL 1.48(H) 1.39(H) 1.33(H)  Sodium 135 - 145 mmol/L 138 137 137  Potassium 3.5 - 5.1 mmol/L 3.6 3.3(L) 3.6  Chloride 98 - 111 mmol/L 102 103 110  CO2 22 - 32 mmol/L _0 Calcium 8.9 - 10.3 mg/dL 8.1(L) 8.4(L) 8.0(L)  Total Protein 6.5 - 8.1 g/dL 6.5 6.2(L) 5.8(L)  Total Bilirubin 0.3 - 1.2 mg/dL 0.7 1.4(H) 0.6  Alkaline Phos 38 - 126 U/L 149(H) 197(H) 82  AST 15 - 41 U/L 24 111(H) 25  ALT 0 - 44 U/L 45(H) 290(H) 27     DIAGNOSTIC IMAGING:  I have independently reviewed the scans and discussed with the patient. MR Brain Wo Contrast  Result Date: 09/08/2020 CLINICAL DATA:  Multiple myeloma not having achieved remission. Iron deficiency anemia, unspecified iron deficiency anemia type. Headache, new or worsening; left-sided ear pain, left-sided headache, myeloma. Additional history provided by scanning technologist: Patient reports intermittent sharp pains in left ear which radiate up to skull. EXAM: MRI HEAD WITHOUT CONTRAST TECHNIQUE: Multiplanar, multiecho pulse sequences of the brain and surrounding structures were obtained without intravenous contrast. COMPARISON:  CT angiogram head 01/02/2020.  Brain MRI 12/11/2019. FINDINGS: Brain: Mild cerebral and cerebellar atrophy. 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 As before, there is cortical laminar necrosis and/or mild chronic hemosiderin deposition at this site. Moderate multifocal T2/FLAIR hyperintensity within the cerebral white matter is nonspecific, but compatible with chronic small vessel ischemic disease. To a lesser degree, chronic small vessel ischemic changes are also present within the pons. Subcentimeter chronic infarct within the right cerebellar hemisphere. There is no acute infarct. No evidence of intracranial mass. No extra-axial fluid collection. No midline shift. Vascular: Expected proximal arterial flow voids. Skull and upper cervical spine: No focal marrow lesion.  Susceptibility artifact arising from partially imaged cervical spinal fusion hardware. Incompletely assessed upper cervical spondylosis. Sinuses/Orbits: Visualized orbits show no acute finding. Trace bilateral ethmoid and left maxillary sinus mucosal thickening at the imaged levels. 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 IMPRESSION: 1. No evidence of acute intracranial abnormality. 2. Stable non-contrast MRI appearance of the brain as compared to 12/11/2019. 3. 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 4. Chronic small-vessel ischemic changes which are moderate in the cerebral white matter and mild in the pons. 5. Subcentimeter chronic infarct within the right cerebellar hemisphere. 6. Mild cerebral and cerebellar atrophy. 7. 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 8. Mild paranasal sinus mucosal thickening. Electronically Signed   By: Kellie Simmering DO   On: 09/08/2020 10:39     ASSESSMENT:  1. 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 -Revlimid and dexamethasone from February 2017 through November 2017. -Velcade maintenance from May 2018 through January 2019 discontinued secondary to diarrhea. -Ixazomib,pomalidomide plus dexamethasone started in February 2019 -Dara plus pomalidomide plus dexamethasone from December 2020, last treatment on 08/25/2019 -Hospitalized with severe anemia, weakness and pneumonia and shortness of breath on 09/05/2019 through 09/15/2019. -Darzalex single agent started back on 12/09/2019. Pomalidomide discontinued after hospitalization. -Revlimid 10 mg 3 weeks on 1 week off started on 03/01/2020. -Daratumumab, Revlimid, dexamethasone discontinued on 08/12/2020 due to progression.  2. 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 -H. pylori antibody was positive. Dr. Laural Golden is starting her on antibiotics. -Ultrasound of the abdomen on 11/27/2019 showed increased echogenicity of hepatic parenchyma consistent with steatosis. Mild amount of sludge noted in  the gallbladder. 10.3 cm bilobed cyst noted in the left kidney.   PLAN:  1.Stage II intermediate risk,IgG kappa multiple myeloma: -We had to hold her day 15 of cycle 1 CyBorD due to elevated AST and ALT. -She does not report any major side effects from the treatment regimen. -Reviewed labs which showed improved AST to normal, ALT mildly elevated at 45.  Alk phos is 149 and total bilirubin is normal.  CBC shows normal white count and platelet count. -We will proceed with cycle 2-day 1 CyBorD.  We will continue dose reduction of cyclophosphamide at 200 mg per metered square.  RTC 2 weeks for follow-up.  2. Weight loss: -Appetite remains good and weight is stable.  3. Myeloma bone disease: -She receives Zometa every 3 months.  4. CKD: -Creatinine is 1.48.  We will closely monitor.  5. Normocytic anemia: -Hemoglobin is 10.2. -We will use Aranesp if it drops below 10.  6. Left ear pain and dizziness: -MRI of the brain showed some chronic changes. -Also showed bilateral TMJ arthritis, left more than right with slight effusion.  Left ear examination did not reveal any sign of infection.  Eardrum is good light reflex. -This is likely contributing to her pain in the left ear.   Orders placed this encounter:  No orders of the defined types were placed in this encounter.    Derek Jack, MD [place] [phone]   I, Milinda Antis, am acting as a scribe for Dr. Sanda Linger.  I, Derek Jack MD, have reviewed the above documentation for accuracy and completeness, and I agree with the above.

## 2020-09-08 NOTE — Patient Instructions (Signed)
[place] at [facility] Discharge Instructions  You were seen today by Dr. Delton Coombes. He went over your recent results and scans; the MRI showed more osteoarthritis in your left TMJ than the right side. You received your treatment today; continue getting your treatment every week. Dr. Delton Coombes will see you back in 2 weeks for labs and follow up.   Thank you for choosing [place] [facility] to provide your oncology and hematology care.  To afford each patient quality time with our provider, please arrive at least 15 minutes before your scheduled appointment time.   If you have a lab appointment with the [facility] please come in thru the Main Entrance and check in at the main information desk  You need to re-schedule your appointment should you arrive 10 or more minutes late.  We strive to give you quality time with our providers, and arriving late affects you and other patients whose appointments are after yours.  Also, if you no show three or more times for appointments you may be dismissed from the clinic at the providers discretion.     Again, thank you for choosing [facility].  Our hope is that these requests will decrease the amount of time that you wait before being seen by our physicians.       _____________________________________________________________  Should you have questions after your visit to [facility], please contact our office at [phone] between the hours of 8:00 a.m. and 4:30 p.m.  Voicemails left after 4:00 p.m. will not be returned until the following business day.  For prescription refill requests, have your pharmacy contact our office and allow 72 hours.    Cancer Center Support Programs:   > Cancer Support Group  2nd Tuesday of the month 1pm-2pm, Journey Room

## 2020-09-08 NOTE — Patient Instructions (Signed)
[facility] Discharge Instructions for Patients Receiving Chemotherapy  Today you received the following chemotherapy agents   To help prevent nausea and vomiting after your treatment, we encourage you to take your nausea medication   If you develop nausea and vomiting that is not controlled by your nausea medication, call the clinic.   BELOW ARE SYMPTOMS THAT SHOULD BE REPORTED IMMEDIATELY:  *FEVER GREATER THAN 100.5 F  *CHILLS WITH OR WITHOUT FEVER  NAUSEA AND VOMITING THAT IS NOT CONTROLLED WITH YOUR NAUSEA MEDICATION  *UNUSUAL SHORTNESS OF BREATH  *UNUSUAL BRUISING OR BLEEDING  TENDERNESS IN MOUTH AND THROAT WITH OR WITHOUT PRESENCE OF ULCERS  *URINARY PROBLEMS  *BOWEL PROBLEMS  UNUSUAL RASH Items with * indicate a potential emergency and should be followed up as soon as possible.  Feel free to call the clinic should you have any questions or concerns. The clinic phone number is [phone].  Please show the CHEMO ALERT CARD at check-in to the Emergency Department and triage nurse.   

## 2020-09-08 NOTE — Progress Notes (Signed)
Patient presents today for Cytoxan infusion, Velcade and B12 injections.  Vital signs and labs within parameters for treatment.  ALT noted to be 45. Patient has no new complaints since last visit.  Cytoxan infusion, Velcade and B12 injections given today per MD orders.  Tolerated infusion and injections without adverse affects.  Injection sites WNL.  Vital signs stable.  No complaints at this time.  Discharge from clinic ambulatory in stable condition.  Alert and oriented X 3.  Follow up with [facility] as scheduled.

## 2020-09-08 NOTE — Progress Notes (Signed)
Patient was assessed by Dr. Katragadda and labs have been reviewed.  Patient is okay to proceed with treatment today. Primary RN and pharmacy aware.   

## 2020-09-09 LAB — KAPPA/LAMBDA LIGHT CHAINS
Kappa free light chain: 201.2 mg/L — ABNORMAL HIGH (ref 3.3–19.4)
Kappa, lambda light chain ratio: 52.95 — ABNORMAL HIGH (ref 0.26–1.65)
Lambda free light chains: 3.8 mg/L — ABNORMAL LOW (ref 5.7–26.3)

## 2020-09-10 LAB — PROTEIN ELECTROPHORESIS, SERUM
A/G Ratio: 1.1 (ref 0.7–1.7)
Albumin ELP: 3 g/dL (ref 2.9–4.4)
Alpha-1-Globulin: 0.3 g/dL (ref 0.0–0.4)
Alpha-2-Globulin: 0.8 g/dL (ref 0.4–1.0)
Beta Globulin: 0.9 g/dL (ref 0.7–1.3)
Gamma Globulin: 0.8 g/dL (ref 0.4–1.8)
Globulin, Total: 2.7 g/dL (ref 2.2–3.9)
M-Spike, %: 0.7 g/dL — ABNORMAL HIGH
Total Protein ELP: 5.7 g/dL — ABNORMAL LOW (ref 6.0–8.5)

## 2020-09-12 IMAGING — CT CT ANGIO NECK
2 of 12 series · 6 of 33 positions shown · IV contrast (Omnipaque or Isovue)
Comparison: Brain MRI 12/11/2019

CLINICAL DATA: Infarct follow-up

EXAM:
CT ANGIOGRAPHY HEAD AND NECK
TECHNIQUE: Multidetector CT imaging of the head and neck was performed using
the standard protocol during bolus administration of intravenous
contrast. Multiplanar CT image reconstructions and MIPs were
obtained to evaluate the vascular anatomy. Carotid stenosis
measurements (when applicable) are obtained utilizing NASCET
criteria, using the distal internal carotid diameter as the
denominator.
CONTRAST:  60mL OMNIPAQUE IOHEXOL 350 MG/ML SOLN

[Series 10: cta head & neck · axial · 0.39mm/px · z∈[-237,-24]mm · 4 of 710 slices shown]
[im 142/710  soft-tissue]
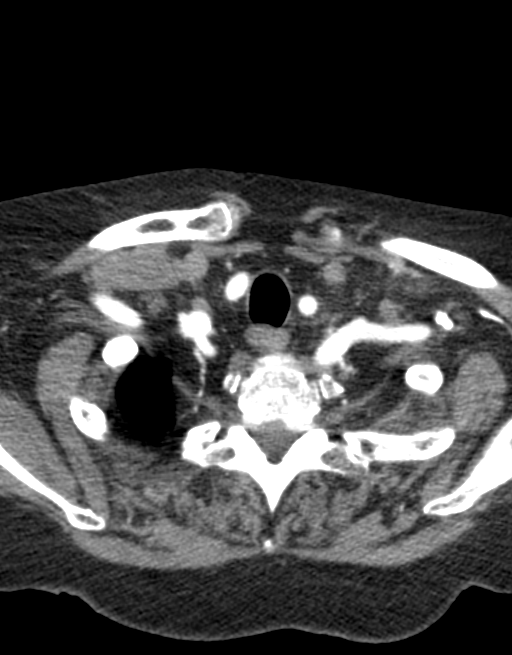
[im 284/710  bone]
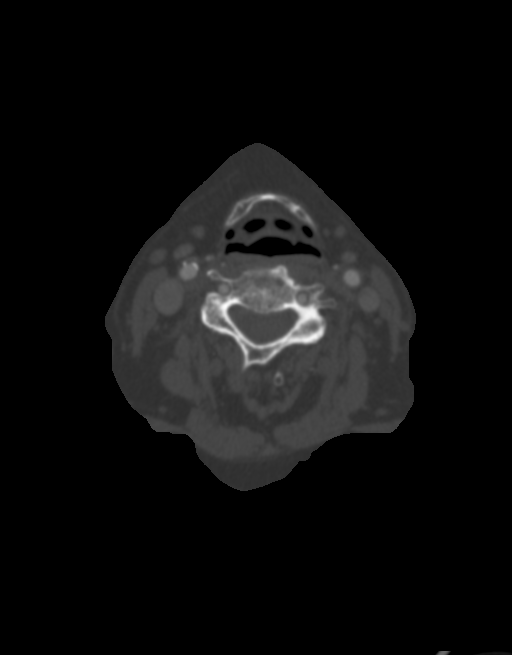
[im 426/710  soft-tissue]
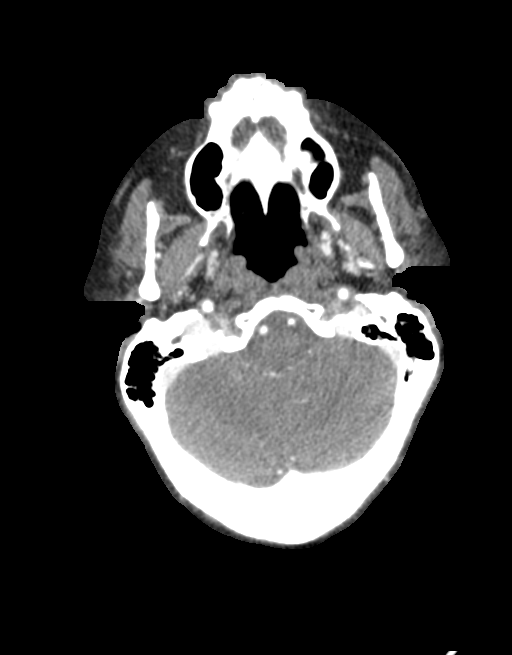
[im 568/710  bone]
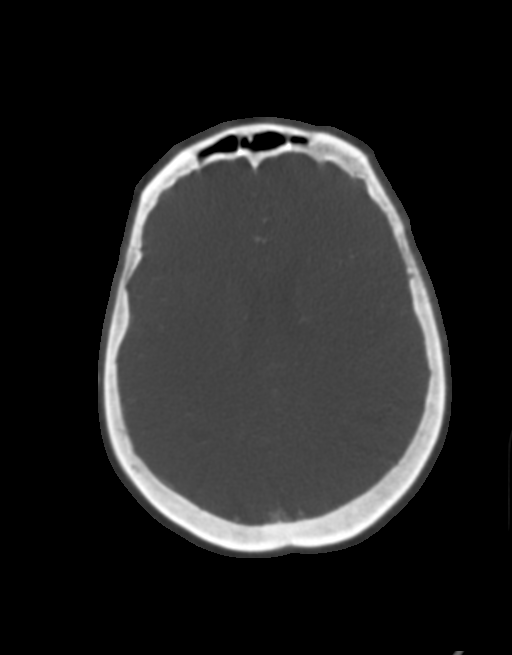

[Series 11: ax thins · axial · 0.39mm/px · z∈[-189,-71]mm · 2 of 355 slices shown]
[im 119/355  soft-tissue]
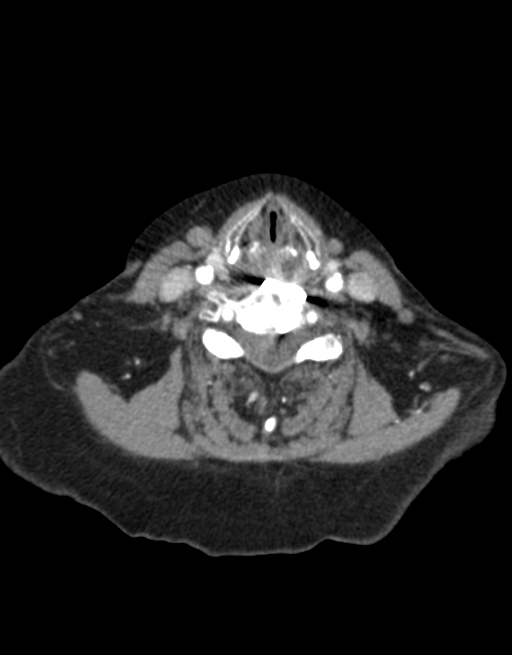
[im 237/355  soft-tissue]
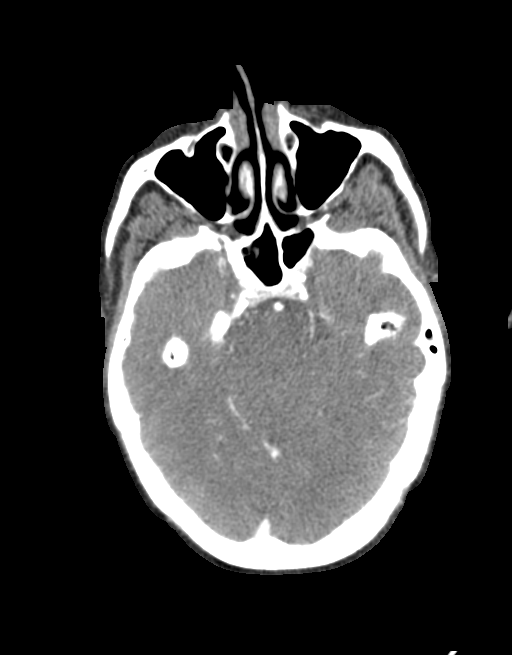

[6 of 33 positions shown; findings below may reference images not displayed]

FINDINGS: CT HEAD FINDINGS

Brain: There is no mass, hemorrhage or extra-axial collection. The
size and configuration of the ventricles and extra-axial CSF spaces
are normal. Subacute to chronic infarct at the right temporal
occipital junction. The brain parenchyma is normal.

Skull: The visualized skull base, calvarium and extracranial soft
tissues are normal.

Sinuses/Orbits: No fluid levels or advanced mucosal thickening of
the visualized paranasal sinuses. No mastoid or middle ear effusion.
The orbits are normal.

CTA NECK FINDINGS

SKELETON: C4-7 anterior fusion

OTHER NECK: Normal pharynx, larynx and major salivary glands. No
cervical lymphadenopathy. Unremarkable thyroid gland.

UPPER CHEST: Emphysema

AORTIC ARCH:

There is mild calcific atherosclerosis of the aortic arch. There is
no aneurysm, dissection or hemodynamically significant stenosis of
the visualized portion of the aorta. Conventional 3 vessel aortic
branching pattern. The visualized proximal subclavian arteries are
widely patent.

RIGHT CAROTID SYSTEM: No dissection, occlusion or aneurysm. There is
calcific atherosclerosis extending into the proximal ICA, resulting
in less than 50% stenosis.

LEFT CAROTID SYSTEM: No dissection, occlusion or aneurysm. Mild
atherosclerotic calcification at the carotid bifurcation without
hemodynamically significant stenosis.

VERTEBRAL ARTERIES: Codominant configuration. Both origins are
clearly patent. There is no dissection, occlusion or flow-limiting
stenosis to the skull base (V1-V3 segments).

CTA HEAD FINDINGS

POSTERIOR CIRCULATION:

--Vertebral arteries: Normal V4 segments.

--Inferior cerebellar arteries: Normal.

--Basilar artery: Normal.

--Superior cerebellar arteries: Normal.

--Posterior cerebral arteries (PCA): Normal.

ANTERIOR CIRCULATION:

--Intracranial internal carotid arteries: Normal.

--Anterior cerebral arteries (ACA): Normal. Both A1 segments are
present. Patent anterior communicating artery (a-comm).

--Middle cerebral arteries (MCA): Normal.

VENOUS SINUSES: As permitted by contrast timing, patent.

ANATOMIC VARIANTS: None

Review of the MIP images confirms the above findings.
IMPRESSION: 1. No emergent large vessel occlusion or high-grade stenosis of the
head or neck.
2. Subacute to chronic right MCA/PCA watershed territory infarct.
3. Aortic Atherosclerosis (WPFN5-VAI.I) and Emphysema (WPFN5-HCB.H).

## 2020-09-15 ENCOUNTER — Ambulatory Visit (HOSPITAL_COMMUNITY): Payer: Medicare Other | Admitting: Hematology

## 2020-09-15 ENCOUNTER — Ambulatory Visit (HOSPITAL_COMMUNITY): Payer: Medicare Other

## 2020-09-15 ENCOUNTER — Other Ambulatory Visit (HOSPITAL_COMMUNITY): Payer: Medicare Other

## 2020-09-16 ENCOUNTER — Other Ambulatory Visit: Payer: Self-pay

## 2020-09-16 ENCOUNTER — Inpatient Hospital Stay (HOSPITAL_COMMUNITY): Payer: Medicare Other

## 2020-09-16 VITALS — BP 98/57 | HR 85 | Temp 97.5°F | Resp 19 | Wt 165.0 lb

## 2020-09-16 DIAGNOSIS — C9000 Multiple myeloma not having achieved remission: Secondary | ICD-10-CM

## 2020-09-16 DIAGNOSIS — D638 Anemia in other chronic diseases classified elsewhere: Secondary | ICD-10-CM

## 2020-09-16 DIAGNOSIS — Z5111 Encounter for antineoplastic chemotherapy: Secondary | ICD-10-CM | POA: Diagnosis not present

## 2020-09-16 LAB — CBC WITH DIFFERENTIAL/PLATELET
Abs Immature Granulocytes: 0.03 10*3/uL (ref 0.00–0.07)
Basophils Absolute: 0 10*3/uL (ref 0.0–0.1)
Basophils Relative: 0 %
Eosinophils Absolute: 0 10*3/uL (ref 0.0–0.5)
Eosinophils Relative: 1 %
HCT: 31.7 % — ABNORMAL LOW (ref 36.0–46.0)
Hemoglobin: 10 g/dL — ABNORMAL LOW (ref 12.0–15.0)
Immature Granulocytes: 1 %
Lymphocytes Relative: 11 %
Lymphs Abs: 0.6 10*3/uL — ABNORMAL LOW (ref 0.7–4.0)
MCH: 34.2 pg — ABNORMAL HIGH (ref 26.0–34.0)
MCHC: 31.5 g/dL (ref 30.0–36.0)
MCV: 108.6 fL — ABNORMAL HIGH (ref 80.0–100.0)
Monocytes Absolute: 0.9 10*3/uL (ref 0.1–1.0)
Monocytes Relative: 17 %
Neutro Abs: 3.7 10*3/uL (ref 1.7–7.7)
Neutrophils Relative %: 70 %
Platelets: 164 10*3/uL (ref 150–400)
RBC: 2.92 MIL/uL — ABNORMAL LOW (ref 3.87–5.11)
RDW: 19 % — ABNORMAL HIGH (ref 11.5–15.5)
WBC: 5.3 10*3/uL (ref 4.0–10.5)
nRBC: 0 % (ref 0.0–0.2)

## 2020-09-16 LAB — COMPREHENSIVE METABOLIC PANEL
ALT: 38 U/L (ref 0–44)
AST: 30 U/L (ref 15–41)
Albumin: 3 g/dL — ABNORMAL LOW (ref 3.5–5.0)
Alkaline Phosphatase: 130 U/L — ABNORMAL HIGH (ref 38–126)
Anion gap: 13 (ref 5–15)
BUN: 33 mg/dL — ABNORMAL HIGH (ref 8–23)
CO2: 26 mmol/L (ref 22–32)
Calcium: 8.6 mg/dL — ABNORMAL LOW (ref 8.9–10.3)
Chloride: 102 mmol/L (ref 98–111)
Creatinine, Ser: 1.91 mg/dL — ABNORMAL HIGH (ref 0.44–1.00)
GFR, Estimated: 26 mL/min — ABNORMAL LOW (ref >60)
Glucose, Bld: 142 mg/dL — ABNORMAL HIGH (ref 70–99)
Potassium: 3.3 mmol/L — ABNORMAL LOW (ref 3.5–5.1)
Sodium: 141 mmol/L (ref 135–145)
Total Bilirubin: 0.8 mg/dL (ref 0.3–1.2)
Total Protein: 6.2 g/dL — ABNORMAL LOW (ref 6.5–8.1)

## 2020-09-16 LAB — SAMPLE TO BLOOD BANK

## 2020-09-16 MED ORDER — SODIUM CHLORIDE 0.9 % IV SOLN
Freq: Once | INTRAVENOUS | Status: AC
  Administered 2020-09-16: 8 mg via INTRAVENOUS
  Filled 2020-09-16: qty 4

## 2020-09-16 MED ORDER — BORTEZOMIB CHEMO SQ INJECTION 3.5 MG (2.5MG/ML)
1.3000 mg/m2 | Freq: Once | INTRAMUSCULAR | Status: AC
  Administered 2020-09-16: 2.5 mg via SUBCUTANEOUS
  Filled 2020-09-16: qty 1

## 2020-09-16 MED ORDER — SODIUM CHLORIDE 0.9 % IV SOLN: INTRAVENOUS | Status: DC

## 2020-09-16 MED ORDER — ZOLEDRONIC ACID 4 MG/5ML IV CONC: 3.0000 mg | Freq: Once | INTRAVENOUS | Status: DC

## 2020-09-16 MED ORDER — SODIUM CHLORIDE 0.9 % IV SOLN
200.0000 mg/m2 | Freq: Once | INTRAVENOUS | Status: AC
  Administered 2020-09-16: 380 mg via INTRAVENOUS
  Filled 2020-09-16: qty 19

## 2020-09-16 NOTE — Progress Notes (Signed)
Carol Bradley presents today for D1C2 Cytoxan and Velcade. Pt denies any new changes or symptoms since last treatment. Lab results and vitals have been reviewed and are stable and within parameters for treatment. Cr is slightly elevated at 1.9, this has been discussed with Dr. Delton Coombes who states it is ok to proceed with Cytoxan and Velcade but to hold Zometa today. Proceeding with treatment today as planned.  Infusions tolerated without incident or complaint. VSS upon completion of treatment. Peripheral IV flushed and removed per protocol, see MAR and IV flowsheet for details. Discharged in satisfactory condition with follow up instructions.

## 2020-09-16 NOTE — Progress Notes (Signed)
Hold zometa today with elevated scr Ok to proceed with Velcade and Cytoxan with elevated scr.  T.O. Dr Alphonzo Severance Aurora Mask, PharmD 09/16/20 @ [phone]

## 2020-09-16 NOTE — Patient Instructions (Signed)
[facility] Discharge Instructions for Patients Receiving Chemotherapy   Beginning January 23rd 2017 lab work for the [facility] will be done in the  Main lab at [facility] on 1st floor. If you have a lab appointment with the [place] please come in thru the  Main Entrance and check in at the main information desk   Today you received the following chemotherapy agents Cytoxan and Velcade  To help prevent nausea and vomiting after your treatment, we encourage you to take your nausea medication   If you develop nausea and vomiting, or diarrhea that is not controlled by your medication, call the clinic.  The clinic phone number is [phone]. Office hours are Monday-Friday 8:30am-5:00pm.  BELOW ARE SYMPTOMS THAT SHOULD BE REPORTED IMMEDIATELY:  *FEVER GREATER THAN 101.0 F  *CHILLS WITH OR WITHOUT FEVER  NAUSEA AND VOMITING THAT IS NOT CONTROLLED WITH YOUR NAUSEA MEDICATION  *UNUSUAL SHORTNESS OF BREATH  *UNUSUAL BRUISING OR BLEEDING  TENDERNESS IN MOUTH AND THROAT WITH OR WITHOUT PRESENCE OF ULCERS  *URINARY PROBLEMS  *BOWEL PROBLEMS  UNUSUAL RASH Items with * indicate a potential emergency and should be followed up as soon as possible. If you have an emergency after office hours please contact your primary care physician or go to the nearest emergency department.  Please call the clinic during office hours if you have any questions or concerns.   You may also contact the Patient Navigator at [phone] should you have any questions or need assistance in obtaining follow up care.      Resources For Cancer Patients and their Caregivers ? American Cancer Society: Can assist with transportation, wigs, general needs, runs Look Good Feel Better.        (904)462-2817 ? Cancer Care: Provides financial assistance, online support groups, medication/co-pay assistance.  1-800-813-HOPE (724) 499-0329) ? [facility] Assists  [place] cancer patients and their families through emotional , educational and financial support.  475 501 9297 ? [place] DSS Where to apply for food stamps, Medicaid and utility assistance. (616) 461-4997 ? [employer]: Transportation to medical appointments. 628-465-1894 ? Social Security Administration: May apply for disability if have a Stage IV cancer. (831)356-3453 (701) 232-7342 ? [employer], Disability and Transit Services: Assists with nutrition, care and transit needs. 2815617659

## 2020-09-22 ENCOUNTER — Inpatient Hospital Stay (HOSPITAL_COMMUNITY): Payer: Medicare Other

## 2020-09-22 ENCOUNTER — Inpatient Hospital Stay (HOSPITAL_BASED_OUTPATIENT_CLINIC_OR_DEPARTMENT_OTHER): Payer: Medicare Other | Admitting: Hematology

## 2020-09-22 ENCOUNTER — Other Ambulatory Visit: Payer: Self-pay

## 2020-09-22 VITALS — BP 119/62 | HR 93 | Temp 97.8°F | Resp 18 | Wt 169.5 lb

## 2020-09-22 VITALS — BP 110/52 | HR 79 | Temp 97.8°F | Resp 18

## 2020-09-22 DIAGNOSIS — C9000 Multiple myeloma not having achieved remission: Secondary | ICD-10-CM

## 2020-09-22 DIAGNOSIS — N183 Chronic kidney disease, stage 3 unspecified: Secondary | ICD-10-CM

## 2020-09-22 DIAGNOSIS — D631 Anemia in chronic kidney disease: Secondary | ICD-10-CM

## 2020-09-22 DIAGNOSIS — E538 Deficiency of other specified B group vitamins: Secondary | ICD-10-CM

## 2020-09-22 DIAGNOSIS — D638 Anemia in other chronic diseases classified elsewhere: Secondary | ICD-10-CM

## 2020-09-22 DIAGNOSIS — D509 Iron deficiency anemia, unspecified: Secondary | ICD-10-CM

## 2020-09-22 DIAGNOSIS — Z5111 Encounter for antineoplastic chemotherapy: Secondary | ICD-10-CM | POA: Diagnosis not present

## 2020-09-22 DIAGNOSIS — D801 Nonfamilial hypogammaglobulinemia: Secondary | ICD-10-CM

## 2020-09-22 LAB — CBC WITH DIFFERENTIAL/PLATELET
Abs Immature Granulocytes: 0.03 10*3/uL (ref 0.00–0.07)
Basophils Absolute: 0 10*3/uL (ref 0.0–0.1)
Basophils Relative: 0 %
Eosinophils Absolute: 0 10*3/uL (ref 0.0–0.5)
Eosinophils Relative: 1 %
HCT: 30 % — ABNORMAL LOW (ref 36.0–46.0)
Hemoglobin: 9.4 g/dL — ABNORMAL LOW (ref 12.0–15.0)
Immature Granulocytes: 1 %
Lymphocytes Relative: 13 %
Lymphs Abs: 0.5 10*3/uL — ABNORMAL LOW (ref 0.7–4.0)
MCH: 34.6 pg — ABNORMAL HIGH (ref 26.0–34.0)
MCHC: 31.3 g/dL (ref 30.0–36.0)
MCV: 110.3 fL — ABNORMAL HIGH (ref 80.0–100.0)
Monocytes Absolute: 0.6 10*3/uL (ref 0.1–1.0)
Monocytes Relative: 15 %
Neutro Abs: 2.8 10*3/uL (ref 1.7–7.7)
Neutrophils Relative %: 70 %
Platelets: 119 10*3/uL — ABNORMAL LOW (ref 150–400)
RBC: 2.72 MIL/uL — ABNORMAL LOW (ref 3.87–5.11)
RDW: 19.1 % — ABNORMAL HIGH (ref 11.5–15.5)
WBC: 3.9 10*3/uL — ABNORMAL LOW (ref 4.0–10.5)
nRBC: 0 % (ref 0.0–0.2)

## 2020-09-22 LAB — COMPREHENSIVE METABOLIC PANEL
ALT: 46 U/L — ABNORMAL HIGH (ref 0–44)
AST: 40 U/L (ref 15–41)
Albumin: 2.9 g/dL — ABNORMAL LOW (ref 3.5–5.0)
Alkaline Phosphatase: 153 U/L — ABNORMAL HIGH (ref 38–126)
Anion gap: 8 (ref 5–15)
BUN: 19 mg/dL (ref 8–23)
CO2: 23 mmol/L (ref 22–32)
Calcium: 7.6 mg/dL — ABNORMAL LOW (ref 8.9–10.3)
Chloride: 112 mmol/L — ABNORMAL HIGH (ref 98–111)
Creatinine, Ser: 1.14 mg/dL — ABNORMAL HIGH (ref 0.44–1.00)
GFR, Estimated: 49 mL/min — ABNORMAL LOW (ref >60)
Glucose, Bld: 93 mg/dL (ref 70–99)
Potassium: 4 mmol/L (ref 3.5–5.1)
Sodium: 143 mmol/L (ref 135–145)
Total Bilirubin: 0.7 mg/dL (ref 0.3–1.2)
Total Protein: 5.9 g/dL — ABNORMAL LOW (ref 6.5–8.1)

## 2020-09-22 LAB — MAGNESIUM: Magnesium: 2.4 mg/dL (ref 1.7–2.4)

## 2020-09-22 MED ORDER — BORTEZOMIB CHEMO SQ INJECTION 3.5 MG (2.5MG/ML)
1.3000 mg/m2 | Freq: Once | INTRAMUSCULAR | Status: AC
  Administered 2020-09-22: 2.5 mg via SUBCUTANEOUS
  Filled 2020-09-22: qty 1

## 2020-09-22 MED ORDER — ONDANSETRON HCL 4 MG/2ML IJ SOLN
8.0000 mg | Freq: Once | INTRAMUSCULAR | Status: AC
  Administered 2020-09-22: 8 mg via INTRAVENOUS

## 2020-09-22 MED ORDER — DARBEPOETIN ALFA 300 MCG/0.6ML IJ SOSY
300.0000 ug | PREFILLED_SYRINGE | Freq: Once | INTRAMUSCULAR | Status: AC
  Administered 2020-09-22: 300 ug via SUBCUTANEOUS
  Filled 2020-09-22: qty 0.6

## 2020-09-22 MED ORDER — SODIUM CHLORIDE 0.9 % IV SOLN
200.0000 mg/m2 | Freq: Once | INTRAVENOUS | Status: AC
  Administered 2020-09-22: 380 mg via INTRAVENOUS
  Filled 2020-09-22: qty 19

## 2020-09-22 MED ORDER — SODIUM CHLORIDE 0.9 % IV SOLN: Freq: Once | INTRAVENOUS | Status: DC

## 2020-09-22 MED ORDER — SODIUM CHLORIDE 0.9 % IV SOLN: Freq: Once | INTRAVENOUS | Status: AC

## 2020-09-22 MED ORDER — ONDANSETRON HCL 4 MG/2ML IJ SOLN
INTRAMUSCULAR | Status: AC
  Filled 2020-09-22: qty 4

## 2020-09-22 NOTE — Progress Notes (Signed)
Patient was assessed by Dr. Delton Coombes and labs have been reviewed.  Patient is okay to proceed with treatment today. Dr. Delton Coombes wants patient to have Aranesp today but NO Zometa. Primary RN and pharmacy aware.

## 2020-09-22 NOTE — Patient Instructions (Signed)
[place] at [facility] Discharge Instructions  You were seen today by Dr. Delton Coombes. He went over your recent results. You received your treatment today; continue getting your treatment every week. Dr. Delton Coombes will see you back in 3 weeks for labs and follow up.   Thank you for choosing [place] at [facility] to provide your oncology and hematology care.  To afford each patient quality time with our provider, please arrive at least 15 minutes before your scheduled appointment time.   If you have a lab appointment with the [place] please come in thru the Main Entrance and check in at the main information desk  You need to re-schedule your appointment should you arrive 10 or more minutes late.  We strive to give you quality time with our providers, and arriving late affects you and other patients whose appointments are after yours.  Also, if you no show three or more times for appointments you may be dismissed from the clinic at the providers discretion.     Again, thank you for choosing [facility].  Our hope is that these requests will decrease the amount of time that you wait before being seen by our physicians.       _____________________________________________________________  Should you have questions after your visit to [facility], please contact our office at [phone] between the hours of 8:00 a.m. and 4:30 p.m.  Voicemails left after 4:00 p.m. will not be returned until the following business day.  For prescription refill requests, have your pharmacy contact our office and allow 72 hours.    Cancer Center Support Programs:   > Cancer Support Group  2nd Tuesday of the month 1pm-2pm, Journey Room

## 2020-09-22 NOTE — Progress Notes (Signed)
Message received from [place] lpn/ Dr. Delton Coombes to proceed with treatment today. Labs reviewed by MD. NO Zometa today due to creatinine levels. Aranesp 300 mcg today per message.   Treatment given today per MD orders. Tolerated infusion without adverse affects. Vital signs stable. No complaints at this time. Discharged from clinic via wheel chair in stable condition. Alert and oriented x 3. F/U with [facility] as scheduled.

## 2020-09-22 NOTE — Patient Instructions (Signed)
[facility] Discharge Instructions for Patients Receiving Chemotherapy  Today you received the following chemotherapy agents   To help prevent nausea and vomiting after your treatment, we encourage you to take your nausea medication   If you develop nausea and vomiting that is not controlled by your nausea medication, call the clinic.   BELOW ARE SYMPTOMS THAT SHOULD BE REPORTED IMMEDIATELY:  *FEVER GREATER THAN 100.5 F  *CHILLS WITH OR WITHOUT FEVER  NAUSEA AND VOMITING THAT IS NOT CONTROLLED WITH YOUR NAUSEA MEDICATION  *UNUSUAL SHORTNESS OF BREATH  *UNUSUAL BRUISING OR BLEEDING  TENDERNESS IN MOUTH AND THROAT WITH OR WITHOUT PRESENCE OF ULCERS  *URINARY PROBLEMS  *BOWEL PROBLEMS  UNUSUAL RASH Items with * indicate a potential emergency and should be followed up as soon as possible.  Feel free to call the clinic should you have any questions or concerns. The clinic phone number is [phone].  Please show the CHEMO ALERT CARD at check-in to the Emergency Department and triage nurse.   

## 2020-09-22 NOTE — Progress Notes (Signed)
Orders received from Dr Delton Coombes to maintain all doses of cyclophosphamide at 200 mg/m2.  Treatment plan updated for today and all future doses.  T.O. Dr Rhys Martini, PharmD 09/22/20 @ 1115

## 2020-09-22 NOTE — Progress Notes (Signed)
[place],  [address]   CLINIC:  Medical Oncology/Hematology  PCP:  [name], MD [address] / [place] [address] [phone]   REASON FOR VISIT:  Follow-up for multiple myeloma & IDA  PRIOR THERAPY:  1. Velcade x 11 cycles from 02/05/2017 to 07/09/2017. 2. Darzalex Faspro & Pomalyst x 9 cycles from 07/28/2019 to 07/15/2020 with progression.  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    03/18/2015 - 07/28/2015 Chemotherapy   Velcade 1.6 mg/m2 discontinued secondary to intolerance   07/28/2015 Adverse Reaction   stool incontinence, weakness, collapse upon standing, felt to be secondary to velcade   11/09/2015 - 12/13/2015 Chemotherapy   cytoxan IV 300 mg/m2 administered X 2 doses only in addition to rev/dex   11/09/2015 - 06/08/2016 Chemotherapy   Revlimid/Dexamethasone    02/05/2017 - 07/09/2017 Chemotherapy   The patient had bortezomib SQ (VELCADE) chemo injection 1.75 mg, 1 mg/m2 = 1.75 mg (66.7 % of original dose 1.5 mg/m2), Subcutaneous,  Once, 1 of 8 cycles Dose modification: 1.5 mg/m2 (original dose 1.5 mg/m2, Cycle 1, Reason: Provider Judgment, Comment: Per Dr. Norma Fredrickson recommendations from [facility] 1 mg/m2 (original dose 1.5 mg/m2, Cycle 1, Reason: Provider Judgment)  for chemotherapy treatment.     07/09/2017 Adverse Reaction   Diarrhea    Chemotherapy   Pomalyst 2 mg (Days 1-21 every 28 days), Ixazomib 2.3 mg (days 1, 8, 15 every 28 days), and Dexamethasone 10 mg (weekly)- Rx's printed on 08/24/2017.  Treatment recommendations from [facility]. Norma Fredrickson at [facility].   07/28/2019 - 07/15/2020 Chemotherapy          08/18/2020 -  Chemotherapy    Patient is on Treatment Plan: MYELOMA CYBORD Q28D X 4 CYCLES        CANCER STAGING: Cancer Staging No matching staging information was found for the patient.  INTERVAL HISTORY:  Ms. KADIA ABAYA, a 80 y.o. female, returns for routine follow-up and consideration for next cycle of chemotherapy. Laytoya was last seen on 09/08/2020.  Due for day #8 of cycle #2 of CyBorD today.   Overall, she tells me she has been feeling good. She notes having 2-3 days of fatigue and weakness after the previous treatment and she also reports having some diarrhea and nausea for several days after her last chemo. She is trying to drink more water daily. The numbness in both feet just above the ankles is stable and she denies having burning pain. Her appetite is good. Her SOB is stable. She denies having leg swelling or abdominal pain.  Overall, she feels ready for next cycle of chemo today.    REVIEW OF SYSTEMS:  Review of Systems  Constitutional: Positive for appetite change (75%) and fatigue (50%).  Respiratory: Positive for shortness of breath (stable).   Cardiovascular: Negative for leg swelling.  Gastrointestinal: Positive for diarrhea (several days after Tx) and nausea (several days after Tx). Negative for abdominal pain.  Neurological: Positive for numbness (stable; bilat feet above ankles).  All other systems reviewed and are negative.   PAST MEDICAL/SURGICAL HISTORY:  Past Medical History:  Diagnosis Date  . 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  CAD (coronary artery disease)   . COPD (chronic obstructive pulmonary disease) ([place])   . History of tobacco abuse   . Hyperlipidemia   . Hypertension   . 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 Multiple myeloma ([place])   . Vitamin B12 deficiency 04/15/2016   Overview:  Vitamin B12 level documented 155, January 2016 with the normal range being 211-924   Past Surgical History:  Procedure  Laterality Date  . BACK SURGERY    . CARDIAC CATHETERIZATION  04/2011   right and left cath showing normal right heart pressures,but newly diagnosed coronary artery disease/drug eluting stent placed to RCA with residual disease in the proximal RCA and LAD and ramus, normal LV function and 60-65% EF  . COLONOSCOPY N/A 09/30/2014   Procedure: COLONOSCOPY;  Surgeon: Rogene Houston, MD;  Location[facility];  Service: Endoscopy;  Laterality: N/A;  225  . CORONARY ANGIOPLASTY WITH STENT PLACEMENT  04/2011   mid RCA: 3.0 X38 mm Promus DES. Residual 40% disease proximally  . ESOPHAGOGASTRODUODENOSCOPY N/A 09/10/2019   Procedure: ESOPHAGOGASTRODUODENOSCOPY (EGD);  Surgeon: Clarene Essex, MD;  Location: [facility];  Service: Endoscopy;  Laterality: N/A;  . NECK SURGERY      SOCIAL HISTORY:  Social History   Socioeconomic History  . Marital status: Widowed    Spouse name: Not on file  . Number of children: Not on file  . Years of education: Not on file  . Highest education level: Not on file  Occupational History  . 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 Smoking status: Former Smoker    Packs/day: 3.00    Years: 25.00    Pack years: 75.00    Types: Cigarettes    Quit date: 07/10/1994    Years since quitting: 26.2  . Smokeless tobacco: Never Used  Vaping Use  . Vaping Use: Never used  Substance and Sexual Activity  . Alcohol use: No  . Drug use: Never  . Sexual activity: Not on file  Other Topics Concern  . Not on file  Social History Narrative  . Not on file   Social Determinants of Health   Financial Resource Strain: Low Risk   . Difficulty of Paying Living Expenses: Not hard at all  Food Insecurity: No Food Insecurity  . Worried About Charity fundraiser in the Last Year: Never true  . Ran Out of Food in the Last Year: Never true  Transportation Needs: No Transportation Needs  . Lack of Transportation (Medical): No  . Lack of Transportation  (Non-Medical): No  Physical Activity: Inactive  . Days of Exercise per Week: 0 days  . Minutes of Exercise per Session: 0 min  Stress: No Stress Concern Present  . Feeling of Stress : Not at all  Social Connections: Socially Isolated  . Frequency of Communication with Friends and Family: More than three times a week  . Frequency of Social Gatherings with Friends and Family: More than three times a week  . Attends Religious Services: Never  . Active Member of Clubs or Organizations: No  . Attends Archivist Meetings: Never  . Marital Status: Widowed  Intimate Partner Violence: Not At Risk  . Fear of Current or Ex-Partner: No  . Emotionally Abused: No  . Physically Abused: No  . Sexually Abused: No    FAMILY HISTORY:  Family History  Problem Relation Age of Onset  . Heart failure Mother   . Cancer Father  CURRENT MEDICATIONS:  Current Outpatient Medications  Medication Sig Dispense Refill  . ALPRAZolam (XANAX) 1 MG tablet Take 1 mg by mouth once.    Marland Kitchen amoxicillin (AMOXIL) 500 MG capsule Take 1 capsule (500 mg total) by mouth 3 (three) times daily. 21 capsule 0  . aspirin 325 MG tablet Take 325 mg by mouth at bedtime.    Marland Kitchen atorvastatin (LIPITOR) 10 MG tablet 1 tablet    . Calcium Carbonate-Vit D-Min (CALCIUM 600+D PLUS MINERALS) 600-400 MG-UNIT CHEW Chew 1 tablet by mouth 3 (three) times daily.     . cyanocobalamin (,VITAMIN B-12,) 1000 MCG/ML injection Inject 1,000 mcg into the skin every 30 (thirty) days.     Marland Kitchen dexamethasone (DECADRON) 2 MG tablet Take 5 tablets once weekly. 30 tablet 3  . diltiazem (CARDIZEM CD) 240 MG 24 hr capsule TAKE ONE CAPSULE BY MOUTH EVERY DAY 90 capsule 3  . diltiazem (DILACOR XR) 240 MG 24 hr capsule 1 capsule    . famotidine (PEPCID) 20 MG tablet Take 20 mg by mouth 2 (two) times daily as needed.     . fluticasone (FLONASE) 50 MCG/ACT nasal spray Place 2 sprays into both nostrils as needed.     . furosemide (LASIX) 40 MG tablet Take 40  mg by mouth daily as needed.     Marland Kitchen ipratropium (ATROVENT) 0.06 % nasal spray SMARTSIG:2 Spray(s) Both Nares Twice Daily PRN    . lenalidomide (REVLIMID) 10 MG capsule Take 1 capsule (10 mg total) by mouth daily as directed. 21 capsule 0  . Magnesium 400 MG TABS See admin instructions.    . magnesium oxide (MAG-OX) 400 MG tablet TAKE ONE TABLET BY MOUTH TWICE DAILY. WHEN TAKING LASIX 60 tablet 2  . meclizine (ANTIVERT) 25 MG tablet Take 25 mg by mouth 3 (three) times daily as needed.    . metoprolol tartrate (LOPRESSOR) 25 MG tablet TAKE 1 AND 1/2 TABLETS BY MOUTH TWICE DAILY 90 tablet 3  . metoprolol-hydrochlorothiazide (LOPRESSOR HCT) 50-25 MG tablet 1 tab 29m    . Misc Natural Products (CRAN-B-OTC PO) VALIUM 563m   . Multiple Minerals-Vitamins (CALCIUM & VIT D3 BONE HEALTH PO) 1 CAPSULE 600MG+ D 400MG    . ondansetron (ZOFRAN) 4 MG tablet Take by mouth.    . ondansetron (ZOFRAN-ODT) 4 MG disintegrating tablet Take 4 mg by mouth every 6 (six) hours as needed.    . pantoprazole (PROTONIX) 40 MG tablet Take 1 tablet (40 mg total) by mouth 2 (two) times daily. 60 tablet 0  . polyethylene glycol powder (GLYCOLAX/MIRALAX) 17 GM/SCOOP powder Take by mouth as needed.     . potassium chloride (KLOR-CON) 10 MEQ tablet Take 1 tablet (10 mEq total) by mouth 3 (three) times daily. 90 tablet 4  . umeclidinium-vilanterol (ANORO ELLIPTA) 62.5-25 MCG/INH AEPB Inhale 1 puff into the lungs daily. 60 each 1  . albuterol (VENTOLIN HFA) 108 (90 Base) MCG/ACT inhaler Inhale 2 puffs into the lungs every 6 (six) hours as needed for wheezing or shortness of breath. (Patient not taking: Reported on 09/22/2020) 18 g 2   No current facility-administered medications for this visit.    ALLERGIES:  Allergies  Allergen Reactions  . Lisinopril Cough  . Other     Other reaction(s): Unknown  . Plavix [Clopidogrel Bisulfate] Swelling    PHYSICAL EXAM:  Performance status (ECOG): 1 - Symptomatic but completely  ambulatory  Vitals:   09/22/20 0955  BP: 119/62  Pulse: 93  Resp: 18  Temp:  97.8 F (36.6 C)  SpO2: 100%   Wt Readings from Last 3 Encounters:  09/22/20 169 lb 8 oz (76.9 kg)  09/16/20 165 lb (74.8 kg)  09/08/20 165 lb 9.6 oz (75.1 kg)   Physical Exam Vitals reviewed.  Constitutional:      Appearance: Normal appearance.  Cardiovascular:     Rate and Rhythm: Normal rate and regular rhythm.     Pulses: Normal pulses.     Heart sounds: Normal heart sounds.  Pulmonary:     Effort: Pulmonary effort is normal.     Breath sounds: Normal breath sounds.  Musculoskeletal:     Right lower leg: No edema.     Left lower leg: No edema.  Neurological:     General: No focal deficit present.     Mental Status: She is alert and oriented to person, place, and time.  Psychiatric:        Mood and Affect: Mood normal.        Behavior: Behavior normal.     LABORATORY DATA:  I have reviewed the labs as listed.  CBC Latest Ref Rng & Units 09/22/2020 09/16/2020 09/08/2020  WBC 4.0 - 10.5 K/uL 3.9(L) 5.3 5.6  Hemoglobin 12.0 - 15.0 g/dL 9.4(L) 10.0(L) 10.2(L)  Hematocrit 36.0 - 46.0 % 30.0(L) 31.7(L) 32.7(L)  Platelets 150 - 400 K/uL 119(L) 164 264   CMP Latest Ref Rng & Units 09/22/2020 09/16/2020 09/08/2020  Glucose 70 - 99 mg/dL 93 142(H) 145(H)  BUN 8 - 23 mg/dL 19 33(H) 33(H)  Creatinine 0.44 - 1.00 mg/dL 1.14(H) 1.91(H) 1.48(H)  Sodium 135 - 145 mmol/L 143 141 138  Potassium 3.5 - 5.1 mmol/L 4.0 3.3(L) 3.6  Chloride 98 - 111 mmol/L 112(H) 102 102  CO2 22 - 32 mmol/L _0 Calcium 8.9 - 10.3 mg/dL 7.6(L) 8.6(L) 8.1(L)  Total Protein 6.5 - 8.1 g/dL 5.9(L) 6.2(L) 6.5  Total Bilirubin 0.3 - 1.2 mg/dL 0.7 0.8 0.7  Alkaline Phos 38 - 126 U/L 153(H) 130(H) 149(H)  AST 15 - 41 U/L 40 30 24  ALT 0 - 44 U/L 46(H) 38 45(H)    DIAGNOSTIC IMAGING:  I have independently reviewed the scans and discussed with the patient. MR Brain Wo Contrast  Result Date: 09/08/2020 CLINICAL DATA:  Multiple  myeloma not having achieved remission. Iron deficiency anemia, unspecified iron deficiency anemia type. Headache, new or worsening; left-sided ear pain, left-sided headache, myeloma. Additional history provided by scanning technologist: Patient reports intermittent sharp pains in left ear which radiate up to skull. EXAM: MRI HEAD WITHOUT CONTRAST TECHNIQUE: Multiplanar, multiecho pulse sequences of the brain and surrounding structures were obtained without intravenous contrast. COMPARISON:  CT angiogram head 01/02/2020.  Brain MRI 12/11/2019. FINDINGS: Brain: Mild cerebral and cerebellar atrophy. 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 As before, there is cortical laminar necrosis and/or mild chronic hemosiderin deposition at this site. Moderate multifocal T2/FLAIR hyperintensity within the cerebral white matter is nonspecific, but compatible with chronic small vessel ischemic disease. To a lesser degree, chronic small vessel ischemic changes are also present within the pons. Subcentimeter chronic infarct within the right cerebellar hemisphere. There is no acute infarct. No evidence of intracranial mass. No extra-axial fluid collection. No midline shift. Vascular: Expected proximal arterial flow voids. Skull and upper cervical spine: No focal marrow lesion. Susceptibility artifact arising from partially imaged cervical spinal fusion hardware. Incompletely  assessed upper cervical spondylosis. Sinuses/Orbits: Visualized orbits show no acute finding. Trace bilateral ethmoid and left maxillary sinus mucosal thickening at the imaged levels. 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 IMPRESSION: 1. No evidence of acute intracranial abnormality. 2. Stable non-contrast MRI appearance of the brain as compared to  12/11/2019. 3. 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 4. Chronic small-vessel ischemic changes which are moderate in the cerebral white matter and mild in the pons. 5. Subcentimeter chronic infarct within the right cerebellar hemisphere. 6. Mild cerebral and cerebellar atrophy. 7. 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 8. Mild paranasal sinus mucosal thickening. Electronically Signed   By: Kellie Simmering DO   On: 09/08/2020 10:39     ASSESSMENT:  1. 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 -Revlimid and dexamethasone from February 2017 through November 2017. -Velcade maintenance from May 2018 through January 2019 discontinued secondary to diarrhea. -Ixazomib,pomalidomide plus dexamethasone started in February 2019 -Dara plus pomalidomide plus dexamethasone from December 2020, last treatment on 08/25/2019 -Hospitalized with severe anemia, weakness and pneumonia and shortness of breath on 09/05/2019 through 09/15/2019. -Darzalex single agent started back on 12/09/2019. Pomalidomide discontinued after hospitalization. -Revlimid 10 mg 3 weeks on 1 week off started on 03/01/2020. -Daratumumab, Revlimid, dexamethasone discontinued on 08/12/2020 due to progression.  2. 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 -H. pylori antibody was positive. Dr. Laural Golden is starting her on antibiotics. -Ultrasound of the abdomen on 11/27/2019 showed increased echogenicity of hepatic parenchyma consistent with steatosis. Mild amount of sludge noted in the gallbladder. 10.3 cm bilobed cyst noted in the left kidney.   PLAN:  1.Stage II intermediate risk,IgG kappa multiple myeloma: -She is tolerating CyBorD reasonably  well. -Reviewed myeloma labs from 09/08/2020.  M spike improved to 0.7.  Kappa light chains improved to 201 with ratio 52.95. -We will proceed with her next treatment today.  She feels tired for 2 to 3 days after each treatment which improves.  She has elevated creatinine after each dose of Zometa.  Hence we will discontinue Zometa. -We will repeat myeloma labs in 2 weeks.  RTC 3 weeks for follow-up.  2.  Neuropathy: -Feet numbness has been stable.  We will closely monitor.  3. Myeloma bone disease: -She has received Zometa for 3 years.  We will hold off on it indefinitely.  4. CKD: -Creatinine improved to 1.14.  5. Normocytic anemia: -Hemoglobin is 9.4 today.  She will receive Aranesp.  6. 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 -The pain is stable to slightly improved.   Orders placed this encounter:  Orders Placed This Encounter  Procedures  . Kappa/lambda light chains  . Protein electrophoresis, serum  . CBC with Differential/Platelet  . Comprehensive metabolic panel  . Magnesium     Derek Jack, MD [place] [phone]   I, Milinda Antis, am acting as a scribe for Dr. Sanda Linger.  I, Derek Jack MD, have reviewed the above documentation for accuracy and completeness, and I agree with the above.

## 2020-09-24 ENCOUNTER — Other Ambulatory Visit (HOSPITAL_COMMUNITY): Payer: Self-pay | Admitting: Hematology

## 2020-09-29 ENCOUNTER — Ambulatory Visit: Payer: Medicare Other | Admitting: Neurology

## 2020-09-29 ENCOUNTER — Inpatient Hospital Stay (HOSPITAL_COMMUNITY): Payer: Medicare Other

## 2020-09-29 ENCOUNTER — Other Ambulatory Visit (HOSPITAL_COMMUNITY): Payer: Self-pay | Admitting: *Deleted

## 2020-09-29 ENCOUNTER — Encounter: Payer: Self-pay | Admitting: Neurology

## 2020-09-29 MED ORDER — ONDANSETRON 8 MG PO TBDP: 8.0000 mg | ORAL_TABLET | Freq: Three times a day (TID) | ORAL | 0 refills | Status: AC | PRN

## 2020-09-29 NOTE — Progress Notes (Signed)
Pt called clinic today reports onset of n/v yesterday morning upon waking.  Pt was to have lab work today and tx.  Encouraged pt to keep lab appt and come to clinic for office visit with Dr. Delton Coombes.  She declined, stating she is feeling too ill to come in today.  Reports she has nausea medication, but is unable to take and keep it down d/t n/v.  M.D. notified.  Rx for Zofran 8 mg ODT sent to pt's pharmacy.  Instructed to take this medication and drink fluids once she is feeling better, and to call the clinic if this medication does not help relieve her nausea.  Pt verbalizes understanding.

## 2020-09-30 ENCOUNTER — Other Ambulatory Visit (HOSPITAL_COMMUNITY): Payer: Self-pay | Admitting: Hematology

## 2020-09-30 DIAGNOSIS — C9000 Multiple myeloma not having achieved remission: Secondary | ICD-10-CM

## 2020-10-06 ENCOUNTER — Inpatient Hospital Stay (HOSPITAL_COMMUNITY): Payer: Medicare Other

## 2020-10-06 ENCOUNTER — Encounter (HOSPITAL_COMMUNITY): Payer: Self-pay

## 2020-10-06 ENCOUNTER — Other Ambulatory Visit: Payer: Self-pay

## 2020-10-06 VITALS — BP 90/75 | HR 85 | Temp 97.0°F | Resp 18 | Wt 163.8 lb

## 2020-10-06 DIAGNOSIS — E538 Deficiency of other specified B group vitamins: Secondary | ICD-10-CM

## 2020-10-06 DIAGNOSIS — C9000 Multiple myeloma not having achieved remission: Secondary | ICD-10-CM

## 2020-10-06 DIAGNOSIS — Z5111 Encounter for antineoplastic chemotherapy: Secondary | ICD-10-CM | POA: Diagnosis not present

## 2020-10-06 DIAGNOSIS — D509 Iron deficiency anemia, unspecified: Secondary | ICD-10-CM

## 2020-10-06 LAB — CBC WITH DIFFERENTIAL/PLATELET
Abs Immature Granulocytes: 0.02 10*3/uL (ref 0.00–0.07)
Basophils Absolute: 0 10*3/uL (ref 0.0–0.1)
Basophils Relative: 1 %
Eosinophils Absolute: 0 10*3/uL (ref 0.0–0.5)
Eosinophils Relative: 1 %
HCT: 35.3 % — ABNORMAL LOW (ref 36.0–46.0)
Hemoglobin: 10.9 g/dL — ABNORMAL LOW (ref 12.0–15.0)
Immature Granulocytes: 1 %
Lymphocytes Relative: 17 %
Lymphs Abs: 0.5 10*3/uL — ABNORMAL LOW (ref 0.7–4.0)
MCH: 34.2 pg — ABNORMAL HIGH (ref 26.0–34.0)
MCHC: 30.9 g/dL (ref 30.0–36.0)
MCV: 110.7 fL — ABNORMAL HIGH (ref 80.0–100.0)
Monocytes Absolute: 0.6 10*3/uL (ref 0.1–1.0)
Monocytes Relative: 20 %
Neutro Abs: 2 10*3/uL (ref 1.7–7.7)
Neutrophils Relative %: 60 %
Platelets: 179 10*3/uL (ref 150–400)
RBC: 3.19 MIL/uL — ABNORMAL LOW (ref 3.87–5.11)
RDW: 17.3 % — ABNORMAL HIGH (ref 11.5–15.5)
WBC: 3.2 10*3/uL — ABNORMAL LOW (ref 4.0–10.5)
nRBC: 0 % (ref 0.0–0.2)

## 2020-10-06 LAB — COMPREHENSIVE METABOLIC PANEL
ALT: 62 U/L — ABNORMAL HIGH (ref 0–44)
AST: 51 U/L — ABNORMAL HIGH (ref 15–41)
Albumin: 3 g/dL — ABNORMAL LOW (ref 3.5–5.0)
Alkaline Phosphatase: 272 U/L — ABNORMAL HIGH (ref 38–126)
Anion gap: 9 (ref 5–15)
BUN: 18 mg/dL (ref 8–23)
CO2: 25 mmol/L (ref 22–32)
Calcium: 8.3 mg/dL — ABNORMAL LOW (ref 8.9–10.3)
Chloride: 107 mmol/L (ref 98–111)
Creatinine, Ser: 1.31 mg/dL — ABNORMAL HIGH (ref 0.44–1.00)
GFR, Estimated: 41 mL/min — ABNORMAL LOW (ref >60)
Glucose, Bld: 100 mg/dL — ABNORMAL HIGH (ref 70–99)
Potassium: 3.7 mmol/L (ref 3.5–5.1)
Sodium: 141 mmol/L (ref 135–145)
Total Bilirubin: 1.3 mg/dL — ABNORMAL HIGH (ref 0.3–1.2)
Total Protein: 5.9 g/dL — ABNORMAL LOW (ref 6.5–8.1)

## 2020-10-06 LAB — MAGNESIUM: Magnesium: 2.3 mg/dL (ref 1.7–2.4)

## 2020-10-06 MED ORDER — BORTEZOMIB CHEMO SQ INJECTION 3.5 MG (2.5MG/ML)
1.3000 mg/m2 | Freq: Once | INTRAMUSCULAR | Status: AC
  Administered 2020-10-06: 2.5 mg via SUBCUTANEOUS
  Filled 2020-10-06: qty 1

## 2020-10-06 MED ORDER — SODIUM CHLORIDE 0.9 % IV SOLN: Freq: Once | INTRAVENOUS | Status: AC

## 2020-10-06 MED ORDER — CYCLOPHOSPHAMIDE CHEMO INJECTION 1 GM
200.0000 mg/m2 | Freq: Once | INTRAMUSCULAR | Status: AC
  Administered 2020-10-06: 380 mg via INTRAVENOUS
  Filled 2020-10-06: qty 19

## 2020-10-06 MED ORDER — SODIUM CHLORIDE 0.9 % IV SOLN
Freq: Once | INTRAVENOUS | Status: AC
  Administered 2020-10-06: 8 mg via INTRAVENOUS
  Filled 2020-10-06: qty 4

## 2020-10-06 MED ORDER — CYANOCOBALAMIN 1000 MCG/ML IJ SOLN
1000.0000 ug | Freq: Once | INTRAMUSCULAR | Status: AC
  Administered 2020-10-06: 1000 ug via INTRAMUSCULAR
  Filled 2020-10-06: qty 1

## 2020-10-06 NOTE — Progress Notes (Signed)
Patient presents today for Velcade injection and Cytoxan. Heart rate elevated on arrival. 103. Recheck 98. Vital signs within parameters for treatment today. Patient denies pain. Patient has complaints of shortness of breath and fatigue not relieved by rest. Labs pending.   Labs within parameters for today's treatment.   Treatment given today per MD orders. Tolerated infusion without adverse affects. Vital signs stable. No complaints at this time. Discharged from clinic via wheel chair in stable condition. Alert and oriented x 3. F/U with [facility] as scheduled.

## 2020-10-06 NOTE — Patient Instructions (Signed)
[facility] Discharge Instructions for Patients Receiving Chemotherapy  Today you received the following chemotherapy agents Velcade and Cytoxan.   To help prevent nausea and vomiting after your treatment, we encourage you to take your nausea medications.   If you develop nausea and vomiting that is not controlled by your nausea medication, call the clinic.   BELOW ARE SYMPTOMS THAT SHOULD BE REPORTED IMMEDIATELY:  *FEVER GREATER THAN 100.5 F  *CHILLS WITH OR WITHOUT FEVER  NAUSEA AND VOMITING THAT IS NOT CONTROLLED WITH YOUR NAUSEA MEDICATION  *UNUSUAL SHORTNESS OF BREATH  *UNUSUAL BRUISING OR BLEEDING  TENDERNESS IN MOUTH AND THROAT WITH OR WITHOUT PRESENCE OF ULCERS  *URINARY PROBLEMS  *BOWEL PROBLEMS  UNUSUAL RASH Items with * indicate a potential emergency and should be followed up as soon as possible.  Feel free to call the clinic should you have any questions or concerns. The clinic phone number is [phone].  Please show the [facility] at check-in to the Emergency Department and triage nurse.

## 2020-10-07 LAB — KAPPA/LAMBDA LIGHT CHAINS
Kappa free light chain: 55 mg/L — ABNORMAL HIGH (ref 3.3–19.4)
Kappa, lambda light chain ratio: 6.63 — ABNORMAL HIGH (ref 0.26–1.65)
Lambda free light chains: 8.3 mg/L (ref 5.7–26.3)

## 2020-10-09 LAB — PROTEIN ELECTROPHORESIS, SERUM
A/G Ratio: 1.2 (ref 0.7–1.7)
Albumin ELP: 2.9 g/dL (ref 2.9–4.4)
Alpha-1-Globulin: 0.3 g/dL (ref 0.0–0.4)
Alpha-2-Globulin: 0.7 g/dL (ref 0.4–1.0)
Beta Globulin: 0.8 g/dL (ref 0.7–1.3)
Gamma Globulin: 0.5 g/dL (ref 0.4–1.8)
Globulin, Total: 2.4 g/dL (ref 2.2–3.9)
M-Spike, %: 0.4 g/dL — ABNORMAL HIGH
Total Protein ELP: 5.3 g/dL — ABNORMAL LOW (ref 6.0–8.5)

## 2020-10-12 ENCOUNTER — Other Ambulatory Visit (HOSPITAL_COMMUNITY): Payer: Self-pay

## 2020-10-13 ENCOUNTER — Inpatient Hospital Stay (HOSPITAL_COMMUNITY): Payer: Medicare Other

## 2020-10-13 ENCOUNTER — Other Ambulatory Visit: Payer: Self-pay

## 2020-10-13 ENCOUNTER — Inpatient Hospital Stay (HOSPITAL_BASED_OUTPATIENT_CLINIC_OR_DEPARTMENT_OTHER): Payer: Medicare Other | Admitting: Hematology

## 2020-10-13 ENCOUNTER — Inpatient Hospital Stay (HOSPITAL_COMMUNITY): Payer: Medicare Other | Attending: Hematology

## 2020-10-13 VITALS — BP 124/62 | HR 87 | Temp 97.0°F | Resp 18

## 2020-10-13 VITALS — BP 108/51 | HR 83 | Temp 97.2°F | Resp 18 | Wt 164.8 lb

## 2020-10-13 DIAGNOSIS — J449 Chronic obstructive pulmonary disease, unspecified: Secondary | ICD-10-CM | POA: Diagnosis not present

## 2020-10-13 DIAGNOSIS — G620 Drug-induced polyneuropathy: Secondary | ICD-10-CM | POA: Insufficient documentation

## 2020-10-13 DIAGNOSIS — T451X5A Adverse effect of antineoplastic and immunosuppressive drugs, initial encounter: Secondary | ICD-10-CM | POA: Insufficient documentation

## 2020-10-13 DIAGNOSIS — E785 Hyperlipidemia, unspecified: Secondary | ICD-10-CM | POA: Diagnosis not present

## 2020-10-13 DIAGNOSIS — Z87891 Personal history of nicotine dependence: Secondary | ICD-10-CM | POA: Diagnosis not present

## 2020-10-13 DIAGNOSIS — I10 Essential (primary) hypertension: Secondary | ICD-10-CM | POA: Diagnosis not present

## 2020-10-13 DIAGNOSIS — R42 Dizziness and giddiness: Secondary | ICD-10-CM | POA: Insufficient documentation

## 2020-10-13 DIAGNOSIS — D509 Iron deficiency anemia, unspecified: Secondary | ICD-10-CM

## 2020-10-13 DIAGNOSIS — Z5111 Encounter for antineoplastic chemotherapy: Secondary | ICD-10-CM | POA: Diagnosis not present

## 2020-10-13 DIAGNOSIS — H9202 Otalgia, left ear: Secondary | ICD-10-CM | POA: Diagnosis not present

## 2020-10-13 DIAGNOSIS — I2510 Atherosclerotic heart disease of native coronary artery without angina pectoris: Secondary | ICD-10-CM | POA: Insufficient documentation

## 2020-10-13 DIAGNOSIS — C9000 Multiple myeloma not having achieved remission: Secondary | ICD-10-CM

## 2020-10-13 DIAGNOSIS — Z79899 Other long term (current) drug therapy: Secondary | ICD-10-CM | POA: Diagnosis not present

## 2020-10-13 LAB — CBC WITH DIFFERENTIAL/PLATELET
Abs Immature Granulocytes: 0.06 10*3/uL (ref 0.00–0.07)
Basophils Absolute: 0 10*3/uL (ref 0.0–0.1)
Basophils Relative: 0 %
Eosinophils Absolute: 0 10*3/uL (ref 0.0–0.5)
Eosinophils Relative: 0 %
HCT: 36.5 % (ref 36.0–46.0)
Hemoglobin: 11 g/dL — ABNORMAL LOW (ref 12.0–15.0)
Immature Granulocytes: 1 %
Lymphocytes Relative: 11 %
Lymphs Abs: 0.6 10*3/uL — ABNORMAL LOW (ref 0.7–4.0)
MCH: 33.2 pg (ref 26.0–34.0)
MCHC: 30.1 g/dL (ref 30.0–36.0)
MCV: 110.3 fL — ABNORMAL HIGH (ref 80.0–100.0)
Monocytes Absolute: 0.5 10*3/uL (ref 0.1–1.0)
Monocytes Relative: 9 %
Neutro Abs: 4.4 10*3/uL (ref 1.7–7.7)
Neutrophils Relative %: 79 %
Platelets: 159 10*3/uL (ref 150–400)
RBC: 3.31 MIL/uL — ABNORMAL LOW (ref 3.87–5.11)
RDW: 17 % — ABNORMAL HIGH (ref 11.5–15.5)
WBC: 5.7 10*3/uL (ref 4.0–10.5)
nRBC: 0 % (ref 0.0–0.2)

## 2020-10-13 LAB — COMPREHENSIVE METABOLIC PANEL
ALT: 109 U/L — ABNORMAL HIGH (ref 0–44)
AST: 62 U/L — ABNORMAL HIGH (ref 15–41)
Albumin: 3.1 g/dL — ABNORMAL LOW (ref 3.5–5.0)
Alkaline Phosphatase: 342 U/L — ABNORMAL HIGH (ref 38–126)
Anion gap: 11 (ref 5–15)
BUN: 17 mg/dL (ref 8–23)
CO2: 24 mmol/L (ref 22–32)
Calcium: 8.5 mg/dL — ABNORMAL LOW (ref 8.9–10.3)
Chloride: 108 mmol/L (ref 98–111)
Creatinine, Ser: 1.2 mg/dL — ABNORMAL HIGH (ref 0.44–1.00)
GFR, Estimated: 46 mL/min — ABNORMAL LOW (ref >60)
Glucose, Bld: 110 mg/dL — ABNORMAL HIGH (ref 70–99)
Potassium: 4.1 mmol/L (ref 3.5–5.1)
Sodium: 143 mmol/L (ref 135–145)
Total Bilirubin: 1.5 mg/dL — ABNORMAL HIGH (ref 0.3–1.2)
Total Protein: 5.9 g/dL — ABNORMAL LOW (ref 6.5–8.1)

## 2020-10-13 MED ORDER — SODIUM CHLORIDE 0.9 % IV SOLN: Freq: Once | INTRAVENOUS | Status: AC

## 2020-10-13 MED ORDER — ONDANSETRON HCL 40 MG/20ML IJ SOLN
Freq: Once | INTRAMUSCULAR | Status: AC
  Administered 2020-10-13: 8 mg via INTRAVENOUS
  Filled 2020-10-13: qty 4

## 2020-10-13 MED ORDER — BORTEZOMIB CHEMO SQ INJECTION 3.5 MG (2.5MG/ML)
1.0000 mg/m2 | Freq: Once | INTRAMUSCULAR | Status: AC
  Administered 2020-10-13: 1.75 mg via SUBCUTANEOUS
  Filled 2020-10-13: qty 0.7

## 2020-10-13 MED ORDER — SODIUM CHLORIDE 0.9 % IV SOLN
200.0000 mg/m2 | Freq: Once | INTRAVENOUS | Status: AC
  Administered 2020-10-13: 380 mg via INTRAVENOUS
  Filled 2020-10-13: qty 19

## 2020-10-13 NOTE — Progress Notes (Signed)
Patient presents today for treatment and follow up appointment with Dr. Delton Coombes. ALT 109 today. Total Bilirubin 1.5. Vital signs within parameters for treatment. MAR reviewed.   Message received from [facility] / Dr. Delton Coombes to proceed with treatment. Labs reviewed by MD.   Patient has complaints of numbness bilateral in fingertips and feet. Patient states MD aware. Velcade dose decreased today per [facility] LPN / Dr. Delton Coombes message.   Treatment given today per MD orders. Tolerated infusion without adverse affects. Vital signs stable. No complaints at this time. Discharged from clinic via wheel chair in stable condition. Alert and oriented x 3. F/U with [facility] as scheduled.

## 2020-10-13 NOTE — Progress Notes (Signed)
Patient was assessed by Dr. Delton Coombes and labs have been reviewed.  Patient is okay to proceed with treatment today. Dr. Delton Coombes is aware that her AST-62 , ALT-109 and Bili-1.5 are elevated.  Dr. Orvan Seen is changing dose on Velcade to 1 mg per meter squared, cytoxan same dose. He is changing the frequency of her treatments to every two weeks now.  Primary RN and pharmacy aware.

## 2020-10-13 NOTE — Patient Instructions (Signed)
[place] [facility] Discharge Instructions for Patients Receiving Chemotherapy  Today you received the following chemotherapy agents Cytoxan and Velcade.   To help prevent nausea and vomiting after your treatment, we encourage you to take your nausea medication.    If you develop nausea and vomiting that is not controlled by your nausea medication, call the clinic.   BELOW ARE SYMPTOMS THAT SHOULD BE REPORTED IMMEDIATELY:  *FEVER GREATER THAN 100.5 F  *CHILLS WITH OR WITHOUT FEVER  NAUSEA AND VOMITING THAT IS NOT CONTROLLED WITH YOUR NAUSEA MEDICATION  *UNUSUAL SHORTNESS OF BREATH  *UNUSUAL BRUISING OR BLEEDING  TENDERNESS IN MOUTH AND THROAT WITH OR WITHOUT PRESENCE OF ULCERS  *URINARY PROBLEMS  *BOWEL PROBLEMS  UNUSUAL RASH Items with * indicate a potential emergency and should be followed up as soon as possible.  Feel free to call the clinic should you have any questions or concerns. The clinic phone number is [phone].  Please show the [facility] at check-in to the Emergency Department and triage nurse.

## 2020-10-13 NOTE — Progress Notes (Signed)
  [facility] [address] [place], [place] [address]   CLINIC:  Medical Oncology/Hematology  PCP:  Vyas, Dhruv B, MD [address] / [place] [id number] [phone]   REASON FOR VISIT:  Follow-up for multiple myeloma & IDA  PRIOR THERAPY:  1. Velcade x 11 cycles from 02/05/2017 to 07/09/2017. 2. Darzalex Faspro & Pomalyst x 9 cycles from 07/28/2019 to 07/15/2020 with progression.  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    03/18/2015 - 07/28/2015 Chemotherapy   Velcade 1.6 mg/m2 discontinued secondary to intolerance   07/28/2015 Adverse Reaction   stool incontinence, weakness, collapse upon standing, felt to be secondary to velcade   11/09/2015 - 12/13/2015 Chemotherapy   cytoxan IV 300 mg/m2 administered X 2 doses only in addition to rev/dex   11/09/2015 - 06/08/2016 Chemotherapy   Revlimid/Dexamethasone    02/05/2017 - 07/09/2017 Chemotherapy   The patient had bortezomib SQ (VELCADE) chemo injection 1.75 mg, 1 mg/m2 = 1.75 mg (66.7 % of original dose 1.5 mg/m2), Subcutaneous,  Once, 1 of 8 cycles Dose modification: 1.5 mg/m2 (original dose 1.5 mg/m2, Cycle 1, Reason: Provider Judgment, Comment: Per Dr. Rodriguez recommendations from [facility] 1 mg/m2 (original dose 1.5 mg/m2, Cycle 1, Reason: Provider Judgment)  for chemotherapy treatment.     07/09/2017 Adverse Reaction   Diarrhea    Chemotherapy   Pomalyst 2 mg (Days 1-21 every 28 days), Ixazomib 2.3 mg (days 1, 8, 15 every 28 days), and Dexamethasone 10 mg (weekly)- Rx's printed on 08/24/2017.  Treatment recommendations from Dr. Rodriguez at [facility].   07/28/2019 - 07/15/2020 Chemotherapy          08/18/2020 -  Chemotherapy    Patient is on Treatment Plan: MYELOMA CYBORD Q28D X 4 CYCLES        CANCER STAGING: Cancer Staging No matching staging information was found for the patient.  INTERVAL HISTORY:  [name], a 80 y.o. female, returns for routine follow-up and consideration for next cycle of chemotherapy. Carol Bradley was last seen on 09/22/2020.  Due for cycle #3 of CyBorD today.   Overall, she tells me she has been feeling okay. She complains of being weak and sleepy with the current regimen. She continues having intermittent sharp pains in her feet up to above her ankles lasting several seconds which is bearable. She has chronic SOB even with mild exertion. She reports that she felt nauseous and vomited on 03/19 and both resolved by 03/21. Her appetite is okay. She is taking Decadron 20 mg on the days that she gets treatment.  She is scheduled to have laser surgery on her left eye on 04/11.  Overall, she feels ready for next cycle of chemo today.    REVIEW OF SYSTEMS:  Review of Systems  Constitutional: Positive for appetite change (50%) and fatigue (50%).  Respiratory: Positive for shortness of breath (w/ mild exertion).   Gastrointestinal: Positive for nausea (on 03/19) and vomiting (on 03/19).  Neurological: Positive for numbness (intermittent sharp pains in bilat feet above ankles).  All other systems reviewed and are negative.   PAST MEDICAL/SURGICAL HISTORY:  Past Medical History:  Diagnosis Date  . 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 CAD (coronary artery disease)   . COPD (chronic obstructive pulmonary disease) (HCC)   . History of tobacco abuse   . Hyperlipidemia   . Hypertension   . 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 Multiple myeloma (HCC)   . Vitamin B12 deficiency 04/15/2016   Overview:  Vitamin B12 level documented 155, January 2016 with the normal range being 211-924   Past Surgical History:  Procedure  Laterality Date  . BACK SURGERY    . CARDIAC CATHETERIZATION  04/2011   right and left cath showing normal right heart pressures,but newly diagnosed coronary artery disease/drug eluting stent placed to RCA with residual disease in the proximal RCA and LAD and ramus, normal LV function and 60-65% EF  . COLONOSCOPY N/A 09/30/2014   Procedure: COLONOSCOPY;  Surgeon: Najeeb U Rehman, MD;  Location: [facility];  Service: Endoscopy;  Laterality: N/A;  225  . CORONARY ANGIOPLASTY WITH STENT PLACEMENT  04/2011   mid RCA: 3.0 X38 mm Promus DES. Residual 40% disease proximally  . ESOPHAGOGASTRODUODENOSCOPY N/A 09/10/2019   Procedure: ESOPHAGOGASTRODUODENOSCOPY (EGD);  Surgeon: Magod, Marc, MD;  Location: [facility];  Service: Endoscopy;  Laterality: N/A;  . NECK SURGERY      SOCIAL HISTORY:  Social History   Socioeconomic History  . Marital status: Widowed    Spouse name: Not on file  . Number of children: Not on file  . Years of education: Not on file  . Highest education level: Not on file  Occupational History  . 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 Smoking status: Former Smoker    Packs/day: 3.00    Years: 25.00    Pack years: 75.00    Types: Cigarettes    Quit date: 07/10/1994    Years since quitting: 26.2  . Smokeless tobacco: Never Used  Vaping Use  . Vaping Use: Never used  Substance and Sexual Activity  . Alcohol use: No  . Drug use: Never  . Sexual activity: Not on file  Other Topics Concern  . Not on file  Social History Narrative  . Not on file   Social Determinants of Health   Financial Resource Strain: Low Risk   . Difficulty of Paying Living Expenses: Not hard at all  Food Insecurity: Not on file  Transportation Needs: Not on file  Physical Activity: Inactive  . Days of Exercise per Week: 0 days  . Minutes of Exercise per Session: 0 min  Stress: No Stress Concern Present  . Feeling of Stress : Not at all  Social Connections:  Socially Isolated  . Frequency of Communication with Friends and Family: More than three times a week  . Frequency of Social Gatherings with Friends and Family: More than three times a week  . Attends Religious Services: Never  . Active Member of Clubs or Organizations: No  . Attends Club or Organization Meetings: Never  . Marital Status: Widowed  Intimate Partner Violence: Not At Risk  . Fear of Current or Ex-Partner: No  . Emotionally Abused: No  . Physically Abused: No  . Sexually Abused: No    FAMILY HISTORY:  Family History  Problem Relation Age of Onset  . Heart failure Mother   . Cancer Father     CURRENT MEDICATIONS:  Current Outpatient Medications  Medication Sig Dispense Refill  . albuterol (VENTOLIN HFA) 108 (90 Base) MCG/ACT inhaler Inhale 2 puffs into the lungs every 6 (six) hours as needed for   wheezing or shortness of breath. 18 g 2  . ALPRAZolam (XANAX) 1 MG tablet Take 1 mg by mouth once.    . aspirin 325 MG tablet Take 325 mg by mouth at bedtime.    . atorvastatin (LIPITOR) 10 MG tablet 1 tablet    . Calcium Carbonate-Vit D-Min (CALCIUM 600+D PLUS MINERALS) 600-400 MG-UNIT CHEW Chew 1 tablet by mouth 3 (three) times daily.     . cyanocobalamin (,VITAMIN B-12,) 1000 MCG/ML injection Inject 1,000 mcg into the skin every 30 (thirty) days.     . dexamethasone (DECADRON) 2 MG tablet TAKE FIVE (5) TABLETS ONCE A WEEK 30 tablet 3  . diltiazem (CARDIZEM CD) 240 MG 24 hr capsule TAKE ONE CAPSULE BY MOUTH EVERY DAY 90 capsule 3  . diltiazem (DILACOR XR) 240 MG 24 hr capsule 1 capsule    . famotidine (PEPCID) 20 MG tablet Take 20 mg by mouth 2 (two) times daily as needed.     . fluticasone (FLONASE) 50 MCG/ACT nasal spray Place 2 sprays into both nostrils as needed.     . furosemide (LASIX) 40 MG tablet Take 40 mg by mouth daily as needed.     . ipratropium (ATROVENT) 0.06 % nasal spray SMARTSIG:2 Spray(s) Both Nares Twice Daily PRN    . Magnesium 400 MG TABS See admin  instructions.    . magnesium oxide (MAG-OX) 400 MG tablet TAKE ONE TABLET BY MOUTH TWICE DAILY. WHEN TAKING LASIX 60 tablet 2  . meclizine (ANTIVERT) 25 MG tablet Take 25 mg by mouth 3 (three) times daily as needed.    . metoprolol tartrate (LOPRESSOR) 25 MG tablet TAKE 1 AND 1/2 TABLETS BY MOUTH TWICE DAILY 90 tablet 3  . metoprolol-hydrochlorothiazide (LOPRESSOR HCT) 50-25 MG tablet 1 tab 25mg    . Misc Natural Products (CRAN-B-OTC PO) VALIUM 5mg    . Multiple Minerals-Vitamins (CALCIUM & VIT D3 BONE HEALTH PO) 1 CAPSULE 600MG+ D 400MG    . ondansetron (ZOFRAN ODT) 8 MG disintegrating tablet Take 1 tablet (8 mg total) by mouth every 8 (eight) hours as needed for nausea or vomiting. 20 tablet 0  . pantoprazole (PROTONIX) 40 MG tablet Take 1 tablet (40 mg total) by mouth 2 (two) times daily. 60 tablet 0  . polyethylene glycol powder (GLYCOLAX/MIRALAX) 17 GM/SCOOP powder Take by mouth as needed.     . potassium chloride (KLOR-CON) 10 MEQ tablet Take 1 tablet (10 mEq total) by mouth 3 (three) times daily. 90 tablet 4  . umeclidinium-vilanterol (ANORO ELLIPTA) 62.5-25 MCG/INH AEPB Inhale 1 puff into the lungs daily. 60 each 1   No current facility-administered medications for this visit.    ALLERGIES:  Allergies  Allergen Reactions  . Lisinopril Cough  . Other     Other reaction(s): Unknown  . Plavix [Clopidogrel Bisulfate] Swelling    PHYSICAL EXAM:  Performance status (ECOG): 1 - Symptomatic but completely ambulatory  Vitals:   10/13/20 1038  BP: (!) 108/51  Pulse: 83  Resp: 18  Temp: (!) 97.2 F (36.2 C)  SpO2: 96%   Wt Readings from Last 3 Encounters:  10/13/20 164 lb 12.8 oz (74.8 kg)  10/06/20 163 lb 12.8 oz (74.3 kg)  09/22/20 169 lb 8 oz (76.9 kg)   Physical Exam  LABORATORY DATA:  I have reviewed the labs as listed.  CBC Latest Ref Rng & Units 10/13/2020 10/06/2020 09/22/2020  WBC 4.0 - 10.5 K/uL 5.7 3.2(L) 3.9(L)  Hemoglobin 12.0 - 15.0 g/dL 11.0(L) 10.9(L) 9.4(L)     Hematocrit 36.0 - 46.0 % 36.5 35.3(L) 30.0(L)  Platelets 150 - 400 K/uL 159 179 119(L)   CMP Latest Ref Rng & Units 10/13/2020 10/06/2020 09/22/2020  Glucose 70 - 99 mg/dL 110(H) 100(H) 93  BUN 8 - 23 mg/dL 17 18 19  Creatinine 0.44 - 1.00 mg/dL 1.20(H) 1.31(H) 1.14(H)  Sodium 135 - 145 mmol/L 143 141 143  Potassium 3.5 - 5.1 mmol/L 4.1 3.7 4.0  Chloride 98 - 111 mmol/L 108 107 112(H)  CO2 22 - 32 mmol/L 24 25 23  Calcium 8.9 - 10.3 mg/dL 8.5(L) 8.3(L) 7.6(L)  Total Protein 6.5 - 8.1 g/dL 5.9(L) 5.9(L) 5.9(L)  Total Bilirubin 0.3 - 1.2 mg/dL 1.5(H) 1.3(H) 0.7  Alkaline Phos 38 - 126 U/L 342(H) 272(H) 153(H)  AST 15 - 41 U/L 62(H) 51(H) 40  ALT 0 - 44 U/L 109(H) 62(H) 46(H)   Lab Results  Component Value Date   TOTALPROTELP 5.3 (L) 10/06/2020   ALBUMINELP 2.9 10/06/2020   A1GS 0.3 10/06/2020   A2GS 0.7 10/06/2020   BETS 0.8 10/06/2020   GAMS 0.5 10/06/2020   MSPIKE 0.4 (H) 10/06/2020   SPEI Comment 10/06/2020   Lab Results  Component Value Date   KPAFRELGTCHN 55.0 (H) 10/06/2020   LAMBDASER 8.3 10/06/2020   KAPLAMBRATIO 6.63 (H) 10/06/2020    DIAGNOSTIC IMAGING:  I have independently reviewed the scans and discussed with the patient. No results found.   ASSESSMENT:  1. 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 -Revlimid and dexamethasone from February 2017 through November 2017. -Velcade maintenance from May 2018 through January 2019 discontinued secondary to diarrhea. -Ixazomib,pomalidomide plus dexamethasone started in February 2019 -Dara plus pomalidomide plus dexamethasone from December 2020, last treatment on 08/25/2019 -Hospitalized with severe anemia, weakness and pneumonia and shortness of breath on 09/05/2019 through 09/15/2019. -Darzalex single agent started back on 12/09/2019. Pomalidomide discontinued after hospitalization. -Revlimid 10 mg 3 weeks on  1 week off started on 03/01/2020. -Daratumumab, Revlimid, dexamethasone discontinued on 08/12/2020 due to progression.  2. 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 -H. pylori antibody was positive. Dr. Rehman is starting her on antibiotics. -Ultrasound of the abdomen on 11/27/2019 showed increased echogenicity of hepatic parenchyma consistent with steatosis. Mild amount of sludge noted in the gallbladder. 10.3 cm bilobed cyst noted in the left kidney.   PLAN:  1.Stage II intermediate risk,IgG kappa multiple myeloma: -She has completed 2 cycles of CyBorD.  She has missed 1 dose in each cycle. -2 weeks ago she felt severely nauseous and vomited and missed that weeks treatment. -Reviewed her myeloma labs from 10/06/2020.  M spike has improved from 1.1 g prior to start of therapy to 0.4 g. -Kappa light chains improved from 557-55.  Light chain ratio also improved to 6.63. -She has gotten excellent response with this regimen.  However she is not able to tolerate well. -I will switch her treatments to every other week by limiting day 8 and day 22. -Reviewed labs today which showed slightly elevated AST of 62 and ALT of 109.  Total bilirubin is also 1.5.  We will cut back on Velcade dose today to 1mg/m2. -RTC 4 weeks for follow-up.  2.  Neuropathy: -Feet numbness has been stable.  However she started having occasional neuropathic pains. -We will cut back on Velcade to 1 mg per metered square.  3. Myeloma bone disease: -She has received Zometa for   3 years.  We will hold off on it indefinitely.  4.  ID prophylaxis: -We will start her back on acyclovir twice daily.  Continue aspirin 81 mg daily.  5. Normocytic anemia: -Hemoglobin today is 11.  She will not need Aranesp.  6. 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 -Pain and dizziness have improved.   Orders  placed this encounter:  No orders of the defined types were placed in this encounter.    Derek Jack, MD [place] [phone]   I, Milinda Antis, am acting as a scribe for Dr. Sanda Linger.  I, Derek Jack MD, have reviewed the above documentation for accuracy and completeness, and I agree with the above.

## 2020-10-13 NOTE — Patient Instructions (Signed)
[place] at [facility] Discharge Instructions  You were seen today by Dr. Delton Coombes. He went over your recent results. You may proceed with your eye surgery. You received your treatment today; your treatment will be switched to every 2 weeks. Continue taking Decadron 20 mg every week. Start taking acyclovir twice daily for shingles prevention. Dr. Delton Coombes will see you back in 1 month for labs and follow up.   Thank you for choosing [place] at [facility] to provide your oncology and hematology care.  To afford each patient quality time with our provider, please arrive at least 15 minutes before your scheduled appointment time.   If you have a lab appointment with the [facility] please come in thru the Main Entrance and check in at the main information desk  You need to re-schedule your appointment should you arrive 10 or more minutes late.  We strive to give you quality time with our providers, and arriving late affects you and other patients whose appointments are after yours.  Also, if you no show three or more times for appointments you may be dismissed from the clinic at the providers discretion.     Again, thank you for choosing [facility].  Our hope is that these requests will decrease the amount of time that you wait before being seen by our physicians.       _____________________________________________________________  Should you have questions after your visit to [facility], please contact our office at [phone] between the hours of 8:00 a.m. and 4:30 p.m.  Voicemails left after 4:00 p.m. will not be returned until the following business day.  For prescription refill requests, have your pharmacy contact our office and allow 72 hours.    Cancer Center Support Programs:   > Cancer Support Group  2nd Tuesday of the month 1pm-2pm, Journey Room

## 2020-10-14 ENCOUNTER — Other Ambulatory Visit (HOSPITAL_COMMUNITY): Payer: Self-pay

## 2020-10-14 DIAGNOSIS — C9001 Multiple myeloma in remission: Secondary | ICD-10-CM

## 2020-10-14 MED ORDER — ACYCLOVIR 200 MG PO CAPS: 200.0000 mg | ORAL_CAPSULE | Freq: Two times a day (BID) | ORAL | 3 refills | Status: AC

## 2020-10-27 ENCOUNTER — Inpatient Hospital Stay (HOSPITAL_COMMUNITY): Payer: Medicare Other

## 2020-10-27 ENCOUNTER — Other Ambulatory Visit: Payer: Self-pay

## 2020-10-27 ENCOUNTER — Encounter (HOSPITAL_COMMUNITY): Payer: Self-pay

## 2020-10-27 DIAGNOSIS — Z5111 Encounter for antineoplastic chemotherapy: Secondary | ICD-10-CM | POA: Diagnosis not present

## 2020-10-27 DIAGNOSIS — C9000 Multiple myeloma not having achieved remission: Secondary | ICD-10-CM

## 2020-10-27 LAB — COMPREHENSIVE METABOLIC PANEL
ALT: 52 U/L — ABNORMAL HIGH (ref 0–44)
AST: 42 U/L — ABNORMAL HIGH (ref 15–41)
Albumin: 3.1 g/dL — ABNORMAL LOW (ref 3.5–5.0)
Alkaline Phosphatase: 163 U/L — ABNORMAL HIGH (ref 38–126)
Anion gap: 11 (ref 5–15)
BUN: 32 mg/dL — ABNORMAL HIGH (ref 8–23)
CO2: 23 mmol/L (ref 22–32)
Calcium: 7.8 mg/dL — ABNORMAL LOW (ref 8.9–10.3)
Chloride: 101 mmol/L (ref 98–111)
Creatinine, Ser: 1.89 mg/dL — ABNORMAL HIGH (ref 0.44–1.00)
GFR, Estimated: 27 mL/min — ABNORMAL LOW (ref >60)
Glucose, Bld: 117 mg/dL — ABNORMAL HIGH (ref 70–99)
Potassium: 4 mmol/L (ref 3.5–5.1)
Sodium: 135 mmol/L (ref 135–145)
Total Bilirubin: 0.7 mg/dL (ref 0.3–1.2)
Total Protein: 6 g/dL — ABNORMAL LOW (ref 6.5–8.1)

## 2020-10-27 LAB — CBC WITH DIFFERENTIAL/PLATELET
Abs Immature Granulocytes: 0.01 10*3/uL (ref 0.00–0.07)
Basophils Absolute: 0 10*3/uL (ref 0.0–0.1)
Basophils Relative: 1 %
Eosinophils Absolute: 0 10*3/uL (ref 0.0–0.5)
Eosinophils Relative: 0 %
HCT: 36.5 % (ref 36.0–46.0)
Hemoglobin: 11.5 g/dL — ABNORMAL LOW (ref 12.0–15.0)
Immature Granulocytes: 0 %
Lymphocytes Relative: 12 %
Lymphs Abs: 0.5 10*3/uL — ABNORMAL LOW (ref 0.7–4.0)
MCH: 33.1 pg (ref 26.0–34.0)
MCHC: 31.5 g/dL (ref 30.0–36.0)
MCV: 105.2 fL — ABNORMAL HIGH (ref 80.0–100.0)
Monocytes Absolute: 0.9 10*3/uL (ref 0.1–1.0)
Monocytes Relative: 24 %
Neutro Abs: 2.5 10*3/uL (ref 1.7–7.7)
Neutrophils Relative %: 63 %
Platelets: 178 10*3/uL (ref 150–400)
RBC: 3.47 MIL/uL — ABNORMAL LOW (ref 3.87–5.11)
RDW: 15.9 % — ABNORMAL HIGH (ref 11.5–15.5)
WBC: 3.9 10*3/uL — ABNORMAL LOW (ref 4.0–10.5)
nRBC: 0 % (ref 0.0–0.2)

## 2020-10-27 MED ORDER — SODIUM CHLORIDE 0.9 % IV SOLN: Freq: Once | INTRAVENOUS | Status: AC

## 2020-10-27 NOTE — Progress Notes (Signed)
Patient presents today for treatment. Creatinine 1.89 today. HGB 11.5 . Aranesp held. Vital signs stable.  BP on arrival 125/41. Patient states, she fell Sunday night. Patient did not seek medical care for her fall. Patient states she has been sick for 4 days. Per patient's words. Patient states she had diarrhea that stopped Sunday night and resolved on it's own without using any home medications. Patient denies vomiting and pain. Patient states she drank Pedialyte while at home.   Spoke with Dr. Delton Coombes pertaining to patient's symptoms and complaints. Verbal order received NO treatment today. Give 1 Liter of NS over 2 hours today. Patient to return next week for appointment with Dr. Delton Coombes on 11/04/20 at 09:15 am. Lab appointment made for 08:15 am Patient aware and new schedule given.   1 Liter of Normal Saline given today per MD orders. Tolerated infusion without adverse affects. Vital signs stable. No complaints at this time. Discharged from clinic via wheel chair in stable condition. Alert and oriented x 3. F/U with [facility] as scheduled.

## 2020-10-27 NOTE — Patient Instructions (Signed)
[facility] at [facility]  Discharge Instructions:   _______________________________________________________________  Thank you for choosing [facility] at [facility] to provide your oncology and hematology care.  To afford each patient quality time with our providers, please arrive at least 15 minutes before your scheduled appointment.  You need to re-schedule your appointment if you arrive 10 or more minutes late.  We strive to give you quality time with our providers, and arriving late affects you and other patients whose appointments are after yours.  Also, if you no show three or more times for appointments you may be dismissed from the clinic.  Again, thank you for choosing [facility] at [facility]. Our hope is that these requests will allow you access to exceptional care and in a timely manner. _______________________________________________________________  If you have questions after your visit, please contact our office at [phone] between the hours of 8:30 a.m. and 5:00 p.m. Voicemails left after 4:30 p.m. will not be returned until the following business day. _______________________________________________________________  For prescription refill requests, have your pharmacy contact our office. _______________________________________________________________  Recommendations made by the consultant and any test results will be sent to your referring physician. _______________________________________________________________ 

## 2020-10-29 ENCOUNTER — Other Ambulatory Visit (HOSPITAL_COMMUNITY): Payer: Self-pay | Admitting: Hematology

## 2020-11-04 ENCOUNTER — Ambulatory Visit (HOSPITAL_COMMUNITY): Payer: Medicare Other | Admitting: Hematology

## 2020-11-04 ENCOUNTER — Inpatient Hospital Stay (HOSPITAL_COMMUNITY): Payer: Medicare Other

## 2020-11-10 ENCOUNTER — Inpatient Hospital Stay (HOSPITAL_COMMUNITY): Payer: Medicare Other

## 2020-11-10 ENCOUNTER — Inpatient Hospital Stay (HOSPITAL_COMMUNITY): Payer: Medicare Other | Attending: Hematology

## 2020-11-10 ENCOUNTER — Inpatient Hospital Stay (HOSPITAL_BASED_OUTPATIENT_CLINIC_OR_DEPARTMENT_OTHER): Payer: Medicare Other | Admitting: Hematology

## 2020-11-10 ENCOUNTER — Other Ambulatory Visit: Payer: Self-pay

## 2020-11-10 VITALS — BP 134/57 | HR 93 | Temp 97.0°F | Resp 18 | Wt 158.4 lb

## 2020-11-10 VITALS — BP 113/49 | HR 100 | Temp 97.2°F | Resp 20

## 2020-11-10 DIAGNOSIS — Z87891 Personal history of nicotine dependence: Secondary | ICD-10-CM | POA: Diagnosis not present

## 2020-11-10 DIAGNOSIS — C9000 Multiple myeloma not having achieved remission: Secondary | ICD-10-CM

## 2020-11-10 DIAGNOSIS — D649 Anemia, unspecified: Secondary | ICD-10-CM | POA: Diagnosis not present

## 2020-11-10 DIAGNOSIS — G629 Polyneuropathy, unspecified: Secondary | ICD-10-CM | POA: Diagnosis not present

## 2020-11-10 DIAGNOSIS — Z79899 Other long term (current) drug therapy: Secondary | ICD-10-CM | POA: Diagnosis not present

## 2020-11-10 DIAGNOSIS — D509 Iron deficiency anemia, unspecified: Secondary | ICD-10-CM | POA: Diagnosis not present

## 2020-11-10 DIAGNOSIS — Z7982 Long term (current) use of aspirin: Secondary | ICD-10-CM | POA: Diagnosis not present

## 2020-11-10 DIAGNOSIS — Z5111 Encounter for antineoplastic chemotherapy: Secondary | ICD-10-CM | POA: Insufficient documentation

## 2020-11-10 DIAGNOSIS — E538 Deficiency of other specified B group vitamins: Secondary | ICD-10-CM | POA: Diagnosis not present

## 2020-11-10 DIAGNOSIS — Z5112 Encounter for antineoplastic immunotherapy: Secondary | ICD-10-CM | POA: Insufficient documentation

## 2020-11-10 DIAGNOSIS — I2510 Atherosclerotic heart disease of native coronary artery without angina pectoris: Secondary | ICD-10-CM | POA: Insufficient documentation

## 2020-11-10 DIAGNOSIS — Z7952 Long term (current) use of systemic steroids: Secondary | ICD-10-CM | POA: Diagnosis not present

## 2020-11-10 LAB — CBC WITH DIFFERENTIAL/PLATELET
Abs Immature Granulocytes: 0.04 10*3/uL (ref 0.00–0.07)
Basophils Absolute: 0 10*3/uL (ref 0.0–0.1)
Basophils Relative: 0 %
Eosinophils Absolute: 0 10*3/uL (ref 0.0–0.5)
Eosinophils Relative: 0 %
HCT: 35 % — ABNORMAL LOW (ref 36.0–46.0)
Hemoglobin: 11 g/dL — ABNORMAL LOW (ref 12.0–15.0)
Immature Granulocytes: 1 %
Lymphocytes Relative: 10 %
Lymphs Abs: 0.7 10*3/uL (ref 0.7–4.0)
MCH: 33.1 pg (ref 26.0–34.0)
MCHC: 31.4 g/dL (ref 30.0–36.0)
MCV: 105.4 fL — ABNORMAL HIGH (ref 80.0–100.0)
Monocytes Absolute: 0.9 10*3/uL (ref 0.1–1.0)
Monocytes Relative: 12 %
Neutro Abs: 5.6 10*3/uL (ref 1.7–7.7)
Neutrophils Relative %: 77 %
Platelets: 172 10*3/uL (ref 150–400)
RBC: 3.32 MIL/uL — ABNORMAL LOW (ref 3.87–5.11)
RDW: 15 % (ref 11.5–15.5)
WBC: 7.2 10*3/uL (ref 4.0–10.5)
nRBC: 0 % (ref 0.0–0.2)

## 2020-11-10 LAB — COMPREHENSIVE METABOLIC PANEL
ALT: 90 U/L — ABNORMAL HIGH (ref 0–44)
AST: 67 U/L — ABNORMAL HIGH (ref 15–41)
Albumin: 3 g/dL — ABNORMAL LOW (ref 3.5–5.0)
Alkaline Phosphatase: 350 U/L — ABNORMAL HIGH (ref 38–126)
Anion gap: 8 (ref 5–15)
BUN: 20 mg/dL (ref 8–23)
CO2: 24 mmol/L (ref 22–32)
Calcium: 8.1 mg/dL — ABNORMAL LOW (ref 8.9–10.3)
Chloride: 107 mmol/L (ref 98–111)
Creatinine, Ser: 1.32 mg/dL — ABNORMAL HIGH (ref 0.44–1.00)
GFR, Estimated: 41 mL/min — ABNORMAL LOW (ref >60)
Glucose, Bld: 128 mg/dL — ABNORMAL HIGH (ref 70–99)
Potassium: 3.4 mmol/L — ABNORMAL LOW (ref 3.5–5.1)
Sodium: 139 mmol/L (ref 135–145)
Total Bilirubin: 1.1 mg/dL (ref 0.3–1.2)
Total Protein: 6.2 g/dL — ABNORMAL LOW (ref 6.5–8.1)

## 2020-11-10 LAB — MAGNESIUM: Magnesium: 2.2 mg/dL (ref 1.7–2.4)

## 2020-11-10 MED ORDER — BORTEZOMIB CHEMO SQ INJECTION 3.5 MG (2.5MG/ML)
1.0000 mg/m2 | Freq: Once | INTRAMUSCULAR | Status: AC
  Administered 2020-11-10: 1.75 mg via SUBCUTANEOUS
  Filled 2020-11-10: qty 0.7

## 2020-11-10 MED ORDER — SODIUM CHLORIDE 0.9 % IV SOLN: INTRAVENOUS | Status: DC

## 2020-11-10 MED ORDER — CYCLOPHOSPHAMIDE CHEMO INJECTION 1 GM
150.0000 mg/m2 | Freq: Once | INTRAMUSCULAR | Status: AC
  Administered 2020-11-10: 280 mg via INTRAVENOUS
  Filled 2020-11-10: qty 14

## 2020-11-10 MED ORDER — CYANOCOBALAMIN 1000 MCG/ML IJ SOLN
1000.0000 ug | Freq: Once | INTRAMUSCULAR | Status: AC
Start: 2020-11-10 — End: 2020-11-10
  Administered 2020-11-10: 1000 ug via INTRAMUSCULAR
  Filled 2020-11-10: qty 1

## 2020-11-10 MED ORDER — GABAPENTIN 100 MG PO CAPS: 100.0000 mg | ORAL_CAPSULE | Freq: Three times a day (TID) | ORAL | 1 refills | Status: AC

## 2020-11-10 MED ORDER — ONDANSETRON HCL 40 MG/20ML IJ SOLN
Freq: Once | INTRAMUSCULAR | Status: AC
  Administered 2020-11-10: 8 mg via INTRAVENOUS
  Filled 2020-11-10: qty 8

## 2020-11-10 NOTE — Patient Instructions (Addendum)
[place] at [facility] Discharge Instructions  You were seen today by Dr. Delton Coombes. He went over your recent results. You received your treatment today. You will be prescribed gabapentin 100 mg to take at bedtime for your shooting pain. Dr. Delton Coombes will see you back in 2 weeks for labs and follow up.   Thank you for choosing [place] at [facility] to provide your oncology and hematology care.  To afford each patient quality time with our provider, please arrive at least 15 minutes before your scheduled appointment time.   If you have a lab appointment with the [facility] please come in thru the Main Entrance and check in at the main information desk  You need to re-schedule your appointment should you arrive 10 or more minutes late.  We strive to give you quality time with our providers, and arriving late affects you and other patients whose appointments are after yours.  Also, if you no show three or more times for appointments you may be dismissed from the clinic at the providers discretion.     Again, thank you for choosing [facility].  Our hope is that these requests will decrease the amount of time that you wait before being seen by our physicians.       _____________________________________________________________  Should you have questions after your visit to [facility], please contact our office at [phone] between the hours of 8:00 a.m. and 4:30 p.m.  Voicemails left after 4:00 p.m. will not be returned until the following business day.  For prescription refill requests, have your pharmacy contact our office and allow 72 hours.    Cancer Center Support Programs:   > Cancer Support Group  2nd Tuesday of the month 1pm-2pm, Journey Room

## 2020-11-10 NOTE — Progress Notes (Signed)
Patient has been examined, vital signs and labs have been reviewed by Dr. Delton Coombes. ANC, Creatinine, LFTs, hemoglobin, and platelets are within treatment parameters per Dr. Delton Coombes. Patient is okay to proceed with treatment per M.D with dose-reduced Cytoxan.

## 2020-11-10 NOTE — Progress Notes (Signed)
Treatment given per orders. Patient tolerated it well without problems. Vitals stable and discharged home from clinic via wheelchair Follow up as scheduled.  

## 2020-11-10 NOTE — Progress Notes (Signed)
[place], [place] [address]   CLINIC:  Medical Oncology/Hematology  PCP:  [name], MD [address] / [place] [address] [phone]   REASON FOR VISIT:  Follow-up for multiple myeloma & IDA  PRIOR THERAPY:  1. Velcade x 11 cycles from 02/05/2017 to 07/09/2017. 2. Darzalex Faspro & Pomalyst x 9 cycles from 07/28/2019 to 07/15/2020 with progression.  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    03/18/2015 - 07/28/2015 Chemotherapy   Velcade 1.6 mg/m2 discontinued secondary to intolerance   07/28/2015 Adverse Reaction   stool incontinence, weakness, collapse upon standing, felt to be secondary to velcade   11/09/2015 - 12/13/2015 Chemotherapy   cytoxan IV 300 mg/m2 administered X 2 doses only in addition to rev/dex   11/09/2015 - 06/08/2016 Chemotherapy   Revlimid/Dexamethasone    02/05/2017 - 07/09/2017 Chemotherapy   The patient had bortezomib SQ (VELCADE) chemo injection 1.75 mg, 1 mg/m2 = 1.75 mg (66.7 % of original dose 1.5 mg/m2), Subcutaneous,  Once, 1 of 8 cycles Dose modification: 1.5 mg/m2 (original dose 1.5 mg/m2, Cycle 1, Reason: Provider Judgment, Comment: Per Dr. Norma Fredrickson recommendations from [facility] 1 mg/m2 (original dose 1.5 mg/m2, Cycle 1, Reason: Provider Judgment)  for chemotherapy treatment.     07/09/2017 Adverse Reaction   Diarrhea    Chemotherapy   Pomalyst 2 mg (Days 1-21 every 28 days), Ixazomib 2.3 mg (days 1, 8, 15 every 28 days), and Dexamethasone 10 mg (weekly)- Rx's printed on 08/24/2017.  Treatment recommendations from [facility]a [facility]rickson at [facility].   07/28/2019 - 07/15/2020 Chemotherapy          08/18/2020 -  Chemotherapy    Patient is on Treatment Plan: MYELOMA CYBORD Q28D X 4 CYCLES        CANCER STAGING: Cancer Staging No matching staging information was found for the patient.  INTERVAL HISTORY:  Carol Bradley, a 80 y.o. female, returns for routine follow-up and consideration for next cycle of chemotherapy. Carol Bradley was last seen on 10/13/2020.  Due for day #15 of cycle #3 of CyBorD today.   Overall, she tells me she has been feeling good. She has improved since her last treatment and has been drinking more water since her diarrhea resolved. Her energy levels are still decreased and she feels weak. She reports having sharp shooting pain in her toes, occasionally in legs and knees, lasting for 1 second, happening both day and night, though it is not waking her up at night. She continues taking acyclovir BID. She denies having dizziness or jaw pain. Her appetite is excellent and she denies having N/V. She is taking Decadron 20 mg once a week at night. She wakes up multiple times during the night after sleeping only a couple of hours.  Overall, she feels ready for next cycle of chemo today.    REVIEW OF SYSTEMS:  Review of Systems  Constitutional: Positive for fatigue (depleted) and unexpected weight change (lost 6 lbs in 4 weeks). Negative for appetite change.  Respiratory: Positive for shortness of breath (chronic).   Gastrointestinal: Negative for nausea and vomiting.  Neurological: Positive for numbness (shooting pain in toes, legs & knees). Negative  for dizziness.  Psychiatric/Behavioral: Positive for sleep disturbance (frequent waking up).  All other systems reviewed and are negative.   PAST MEDICAL/SURGICAL HISTORY:  Past Medical History:  Diagnosis Date  . 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 CAD (coronary artery disease)   . COPD (chronic obstructive pulmonary disease) ([place])   . History of tobacco abuse   . Hyperlipidemia   .  Hypertension   . 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 Multiple myeloma ([place])   . Vitamin B12 deficiency 04/15/2016   Overview:  Vitamin B12 level documented 155, January 2016 with the normal range being 211-924   Past Surgical History:  Procedure Laterality Date  . BACK SURGERY    . CARDIAC CATHETERIZATION  04/2011   right and left cath showing normal right heart pressures,but newly diagnosed coronary artery disease/drug eluting stent placed to RCA with residual disease in the proximal RCA and LAD and ramus, normal LV function and 60-65% EF  . COLONOSCOPY N/A 09/30/2014   Procedure: COLONOSCOPY;  Surgeon: Rogene Houston, MD;  [facility]: [facility];  Service: Endoscopy;  Laterality: N/A;  225  . CORONARY ANGIOPLASTY WITH STENT PLACEMENT  04/2011   mid RCA: 3.0 X38 mm Promus DES. Residual 40% disease proximally  . ESOPHAGOGASTRODUODENOSCOPY N/A 09/10/2019   Procedure: ESOPHAGOGASTRODUODENOSCOPY (EGD);  Surgeon: Clarene Essex, MD;  Location: [facility];  Service: Endoscopy;  Laterality: N/A;  . NECK SURGERY      SOCIAL HISTORY:  Social History   Socioeconomic History  . Marital status: Widowed    Spouse name: Not on file  . Number of children: Not on file  . Years of education: Not on file  . Highest education level: Not on file  Occupational History  . 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 Smoking status: Former Smoker    Packs/day: 3.00    Years: 25.00    Pack years: 75.00    Types: Cigarettes    Quit date: 07/10/1994    Years since quitting: 26.3  . Smokeless tobacco: Never Used  Vaping Use  . Vaping Use: Never used  Substance and Sexual Activity  . Alcohol use: No  . Drug use: Never  . Sexual activity: Not on file  Other Topics Concern  . Not on file  Social History Narrative  . Not on file   Social Determinants of Health   Financial Resource Strain: Low Risk   . Difficulty of Paying Living Expenses: Not hard at all  Food  Insecurity: Not on file  Transportation Needs: Not on file  Physical Activity: Inactive  . Days of Exercise per Week: 0 days  . Minutes of Exercise per Session: 0 min  Stress: No Stress Concern Present  . Feeling of Stress : Not at all  Social Connections: Socially Isolated  . Frequency of Communication with Friends and Family: More than three times a week  . Frequency of Social Gatherings with Friends and Family: More than three times a week  . Attends Religious Services: Never  . Active Member of Clubs or Organizations: No  . Attends Archivist Meetings: Never  . Marital Status: Widowed  Intimate Partner Violence: Not At Risk  . Fear of Current or Ex-Partner: No  . Emotionally Abused: No  . Physically Abused: No  . Sexually Abused: No    FAMILY HISTORY:  Family History  Problem Relation Age of Onset  . Heart failure Mother   .  Cancer Father     CURRENT MEDICATIONS:  Current Outpatient Medications  Medication Sig Dispense Refill  . acyclovir (ZOVIRAX) 200 MG capsule Take 1 capsule (200 mg total) by mouth 2 (two) times daily. 180 capsule 3  . albuterol (VENTOLIN HFA) 108 (90 Base) MCG/ACT inhaler INHALE TWO PUFFS INTO THE LUNGS EVERY 6 HOURS AS NEEDED FOR WHEEZING OR SHORTNESS OF BREATH. 8.5 g 2  . ALPRAZolam (XANAX) 1 MG tablet Take 1 mg by mouth once.    Marland Kitchen aspirin 325 MG tablet Take 325 mg by mouth at bedtime.    Marland Kitchen atorvastatin (LIPITOR) 10 MG tablet 1 tablet    . Calcium Carbonate-Vit D-Min (CALCIUM 600+D PLUS MINERALS) 600-400 MG-UNIT CHEW Chew 1 tablet by mouth 3 (three) times daily.     . cyanocobalamin (,VITAMIN B-12,) 1000 MCG/ML injection Inject 1,000 mcg into the skin every 30 (thirty) days.     Marland Kitchen dexamethasone (DECADRON) 2 MG tablet TAKE FIVE (5) TABLETS ONCE A WEEK 30 tablet 3  . diltiazem (CARDIZEM CD) 240 MG 24 hr capsule TAKE ONE CAPSULE BY MOUTH EVERY DAY 90 capsule 3  . diltiazem (DILACOR XR) 240 MG 24 hr capsule 1 capsule    . erythromycin  ophthalmic ointment Place into both eyes nightly.    . famotidine (PEPCID) 20 MG tablet Take 20 mg by mouth 2 (two) times daily as needed.     . fluticasone (FLONASE) 50 MCG/ACT nasal spray Place 2 sprays into both nostrils as needed.     . furosemide (LASIX) 40 MG tablet Take 40 mg by mouth daily as needed.     . gabapentin (NEURONTIN) 100 MG capsule Take 1 capsule (100 mg total) by mouth 3 (three) times daily. 30 capsule 1  . ipratropium (ATROVENT) 0.06 % nasal spray SMARTSIG:2 Spray(s) Both Nares Twice Daily PRN    . Magnesium 400 MG TABS See admin instructions.    . magnesium oxide (MAG-OX) 400 MG tablet TAKE ONE TABLET BY MOUTH TWICE DAILY. WHEN TAKING LASIX 60 tablet 2  . meclizine (ANTIVERT) 25 MG tablet Take 25 mg by mouth 3 (three) times daily as needed.    . metoprolol tartrate (LOPRESSOR) 25 MG tablet TAKE 1 AND 1/2 TABLETS BY MOUTH TWICE DAILY 90 tablet 3  . metoprolol-hydrochlorothiazide (LOPRESSOR HCT) 50-25 MG tablet 1 tab 69m    . Misc Natural Products (CRAN-B-OTC PO) VALIUM 583m   . Multiple Minerals-Vitamins (CALCIUM & VIT D3 BONE HEALTH PO) 1 CAPSULE 600MG+ D 400MG    . ondansetron (ZOFRAN ODT) 8 MG disintegrating tablet Take 1 tablet (8 mg total) by mouth every 8 (eight) hours as needed for nausea or vomiting. 20 tablet 0  . pantoprazole (PROTONIX) 40 MG tablet Take 1 tablet (40 mg total) by mouth 2 (two) times daily. 60 tablet 0  . polyethylene glycol powder (GLYCOLAX/MIRALAX) 17 GM/SCOOP powder Take by mouth as needed.     . potassium chloride (KLOR-CON) 10 MEQ tablet Take 1 tablet (10 mEq total) by mouth 3 (three) times daily. 90 tablet 4  . prednisoLONE acetate (PRED FORTE) 1 % ophthalmic suspension Place 1 drop into the left eye 4 (four) times daily.    . Marland Kitchenmeclidinium-vilanterol (ANORO ELLIPTA) 62.5-25 MCG/INH AEPB Inhale 1 puff into the lungs daily. 60 each 1   No current facility-administered medications for this visit.    ALLERGIES:  Allergies  Allergen Reactions   . Lisinopril Cough  . Other     Other reaction(s): Unknown  . Plavix [Clopidogrel  Bisulfate] Swelling    PHYSICAL EXAM:  Performance status (ECOG): 1 - Symptomatic but completely ambulatory  Vitals:   11/10/20 1245  BP: (!) 134/57  Pulse: 93  Resp: 18  Temp: (!) 97 F (36.1 C)  SpO2: 96%   Wt Readings from Last 3 Encounters:  11/10/20 158 lb 6.4 oz (71.8 kg)  10/13/20 164 lb 12.8 oz (74.8 kg)  10/06/20 163 lb 12.8 oz (74.3 kg)   Physical Exam Vitals reviewed.  Constitutional:      Appearance: Normal appearance.  Cardiovascular:     Rate and Rhythm: Normal rate and regular rhythm.     Pulses: Normal pulses.     Heart sounds: Normal heart sounds.  Pulmonary:     Effort: Pulmonary effort is normal.     Breath sounds: Normal breath sounds.  Musculoskeletal:     Right lower leg: No edema.     Left lower leg: No edema.  Neurological:     General: No focal deficit present.     Mental Status: She is alert and oriented to person, place, and time.  Psychiatric:        Mood and Affect: Mood normal.        Behavior: Behavior normal.     LABORATORY DATA:  I have reviewed the labs as listed.  CBC Latest Ref Rng & Units 11/10/2020 10/27/2020 10/13/2020  WBC 4.0 - 10.5 K/uL 7.2 3.9(L) 5.7  Hemoglobin 12.0 - 15.0 g/dL 11.0(L) 11.5(L) 11.0(L)  Hematocrit 36.0 - 46.0 % 35.0(L) 36.5 36.5  Platelets 150 - 400 K/uL 172 178 159   CMP Latest Ref Rng & Units 11/10/2020 10/27/2020 10/13/2020  Glucose 70 - 99 mg/dL 128(H) 117(H) 110(H)  BUN 8 - 23 mg/dL 20 32(H) 17  Creatinine 0.44 - 1.00 mg/dL 1.32(H) 1.89(H) 1.20(H)  Sodium 135 - 145 mmol/L 139 135 143  Potassium 3.5 - 5.1 mmol/L 3.4(L) 4.0 4.1  Chloride 98 - 111 mmol/L 107 101 108  CO2 22 - 32 mmol/L _0 Calcium 8.9 - 10.3 mg/dL 8.1(L) 7.8(L) 8.5(L)  Total Protein 6.5 - 8.1 g/dL 6.2(L) 6.0(L) 5.9(L)  Total Bilirubin 0.3 - 1.2 mg/dL 1.1 0.7 1.5(H)  Alkaline Phos 38 - 126 U/L 350(H) 163(H) 342(H)  AST 15 - 41 U/L 67(H) 42(H)  62(H)  ALT 0 - 44 U/L 90(H) 52(H) 109(H)   Lab Results  Component Value Date   TOTALPROTELP 5.3 (L) 10/06/2020   ALBUMINELP 2.9 10/06/2020   A1GS 0.3 10/06/2020   A2GS 0.7 10/06/2020   BETS 0.8 10/06/2020   GAMS 0.5 10/06/2020   MSPIKE 0.4 (H) 10/06/2020   SPEI Comment 10/06/2020    Lab Results  Component Value Date   KPAFRELGTCHN 55.0 (H) 10/06/2020   LAMBDASER 8.3 10/06/2020   KAPLAMBRATIO 6.63 (H) 10/06/2020    DIAGNOSTIC IMAGING:  I have independently reviewed the scans and discussed with the patient. No results found.   ASSESSMENT:  1. 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 -Revlimid and dexamethasone from February 2017 through November 2017. -Velcade maintenance from May 2018 through January 2019 discontinued secondary to diarrhea. -Ixazomib,pomalidomide plus dexamethasone started in February 2019 -Dara plus pomalidomide plus dexamethasone from December 2020, last treatment on 08/25/2019 -Hospitalized with severe anemia, weakness and pneumonia and shortness of breath on 09/05/2019 through 09/15/2019. -Darzalex single agent started back on 12/09/2019. Pomalidomide discontinued after hospitalization. -Revlimid 10 mg 3 weeks on 1 week  off started on 03/01/2020. -Daratumumab, Revlimid, dexamethasone discontinued on 08/12/2020 due to progression.  2. 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 -H. pylori antibody was positive. Dr. Laural Golden is starting her on antibiotics. -Ultrasound of the abdomen on 11/27/2019 showed increased echogenicity of hepatic parenchyma consistent with steatosis. Mild amount of sludge noted in the gallbladder. 10.3 cm bilobed cyst noted in the left kidney.   PLAN:  1.Stage II intermediate risk,IgG kappa multiple myeloma: -She is having some difficulty tolerating CyBorD, even after we switched to every  2-week dosing. - Today labs reviewed AST increased to 67 and ALT 219.  Total bilirubin is 1.1.  Creatinine has improved to 1.32 from 1.89. - SPEP showed M spike is stable at 0.4.  However free light chain ratio continuing to improve. - Continue dexamethasone 10 mg weekly.  She takes it at nighttime. - We will dose reduce Cytoxan to 150 mg per metered square today. - RTC 2 weeks for follow-up with labs and treatment.   2.Neuropathy: -Feet numbness has been stable.  Occasional shooting neuropathic pains in the feet, predominantly at night. - We have cut back on Velcade dose to 1 mg per metered square. - We will start her on gabapentin 100 mg at bedtime.  3. Myeloma bone disease: -Zometa was held-she completed it for 3 years.  4.  ID prophylaxis: -Continue acyclovir twice daily.  Continue aspirin 81 mg daily.  5. Normocytic anemia: -Hemoglobin today is 11.  She will not need Aranesp.  6. Left ear pain and dizziness: -She reports improvement in her left-sided TMJ pain.  MRI showed bilateral TMJ arthritis, left more than right.   Orders placed this encounter:  No orders of the defined types were placed in this encounter.    Derek Jack, MD [place] [phone]   I, Milinda Antis, am acting as a scribe for Dr. Sanda Linger.  I, Derek Jack MD, have reviewed the above documentation for accuracy and completeness, and I agree with the above.

## 2020-11-11 LAB — KAPPA/LAMBDA LIGHT CHAINS
Kappa free light chain: 37.4 mg/L — ABNORMAL HIGH (ref 3.3–19.4)
Kappa, lambda light chain ratio: 2.46 — ABNORMAL HIGH (ref 0.26–1.65)
Lambda free light chains: 15.2 mg/L (ref 5.7–26.3)

## 2020-11-12 ENCOUNTER — Other Ambulatory Visit (HOSPITAL_COMMUNITY): Payer: Self-pay | Admitting: Hematology

## 2020-11-12 DIAGNOSIS — C9001 Multiple myeloma in remission: Secondary | ICD-10-CM

## 2020-11-12 DIAGNOSIS — F419 Anxiety disorder, unspecified: Secondary | ICD-10-CM

## 2020-11-12 LAB — PROTEIN ELECTROPHORESIS, SERUM
A/G Ratio: 1.1 (ref 0.7–1.7)
Albumin ELP: 2.9 g/dL (ref 2.9–4.4)
Alpha-1-Globulin: 0.3 g/dL (ref 0.0–0.4)
Alpha-2-Globulin: 0.8 g/dL (ref 0.4–1.0)
Beta Globulin: 0.9 g/dL (ref 0.7–1.3)
Gamma Globulin: 0.6 g/dL (ref 0.4–1.8)
Globulin, Total: 2.6 g/dL (ref 2.2–3.9)
M-Spike, %: 0.4 g/dL — ABNORMAL HIGH
Total Protein ELP: 5.5 g/dL — ABNORMAL LOW (ref 6.0–8.5)

## 2020-11-19 ENCOUNTER — Other Ambulatory Visit: Payer: Self-pay

## 2020-11-19 ENCOUNTER — Inpatient Hospital Stay (HOSPITAL_COMMUNITY): Payer: Medicare Other

## 2020-11-19 ENCOUNTER — Emergency Department (HOSPITAL_COMMUNITY): Payer: Medicare Other

## 2020-11-19 ENCOUNTER — Encounter (HOSPITAL_COMMUNITY): Payer: Self-pay | Admitting: Emergency Medicine

## 2020-11-19 ENCOUNTER — Inpatient Hospital Stay (HOSPITAL_COMMUNITY)
Admission: EM | Admit: 2020-11-19 | Discharge: 2020-12-08 | DRG: 871 | Disposition: E | Payer: Medicare Other | Attending: Internal Medicine | Admitting: Internal Medicine

## 2020-11-19 DIAGNOSIS — D65 Disseminated intravascular coagulation [defibrination syndrome]: Secondary | ICD-10-CM | POA: Diagnosis present

## 2020-11-19 DIAGNOSIS — A419 Sepsis, unspecified organism: Secondary | ICD-10-CM

## 2020-11-19 DIAGNOSIS — I4901 Ventricular fibrillation: Secondary | ICD-10-CM | POA: Diagnosis not present

## 2020-11-19 DIAGNOSIS — R0602 Shortness of breath: Secondary | ICD-10-CM | POA: Diagnosis present

## 2020-11-19 DIAGNOSIS — I130 Hypertensive heart and chronic kidney disease with heart failure and stage 1 through stage 4 chronic kidney disease, or unspecified chronic kidney disease: Secondary | ICD-10-CM | POA: Diagnosis present

## 2020-11-19 DIAGNOSIS — Z809 Family history of malignant neoplasm, unspecified: Secondary | ICD-10-CM

## 2020-11-19 DIAGNOSIS — I462 Cardiac arrest due to underlying cardiac condition: Secondary | ICD-10-CM | POA: Diagnosis not present

## 2020-11-19 DIAGNOSIS — Z79899 Other long term (current) drug therapy: Secondary | ICD-10-CM

## 2020-11-19 DIAGNOSIS — N1831 Chronic kidney disease, stage 3a: Secondary | ICD-10-CM | POA: Diagnosis present

## 2020-11-19 DIAGNOSIS — Z9289 Personal history of other medical treatment: Secondary | ICD-10-CM

## 2020-11-19 DIAGNOSIS — Z888 Allergy status to other drugs, medicaments and biological substances status: Secondary | ICD-10-CM

## 2020-11-19 DIAGNOSIS — E874 Mixed disorder of acid-base balance: Secondary | ICD-10-CM | POA: Diagnosis present

## 2020-11-19 DIAGNOSIS — D849 Immunodeficiency, unspecified: Secondary | ICD-10-CM | POA: Diagnosis present

## 2020-11-19 DIAGNOSIS — C9000 Multiple myeloma not having achieved remission: Secondary | ICD-10-CM | POA: Diagnosis present

## 2020-11-19 DIAGNOSIS — Z4659 Encounter for fitting and adjustment of other gastrointestinal appliance and device: Secondary | ICD-10-CM

## 2020-11-19 DIAGNOSIS — R579 Shock, unspecified: Secondary | ICD-10-CM | POA: Diagnosis present

## 2020-11-19 DIAGNOSIS — R6521 Severe sepsis with septic shock: Secondary | ICD-10-CM | POA: Diagnosis present

## 2020-11-19 DIAGNOSIS — R0902 Hypoxemia: Secondary | ICD-10-CM

## 2020-11-19 DIAGNOSIS — Z8249 Family history of ischemic heart disease and other diseases of the circulatory system: Secondary | ICD-10-CM | POA: Diagnosis not present

## 2020-11-19 DIAGNOSIS — N183 Chronic kidney disease, stage 3 unspecified: Secondary | ICD-10-CM | POA: Diagnosis present

## 2020-11-19 DIAGNOSIS — K7200 Acute and subacute hepatic failure without coma: Secondary | ICD-10-CM | POA: Diagnosis present

## 2020-11-19 DIAGNOSIS — I2510 Atherosclerotic heart disease of native coronary artery without angina pectoris: Secondary | ICD-10-CM | POA: Diagnosis present

## 2020-11-19 DIAGNOSIS — E162 Hypoglycemia, unspecified: Secondary | ICD-10-CM | POA: Diagnosis present

## 2020-11-19 DIAGNOSIS — I5032 Chronic diastolic (congestive) heart failure: Secondary | ICD-10-CM | POA: Diagnosis present

## 2020-11-19 DIAGNOSIS — E785 Hyperlipidemia, unspecified: Secondary | ICD-10-CM | POA: Diagnosis present

## 2020-11-19 DIAGNOSIS — Z452 Encounter for adjustment and management of vascular access device: Secondary | ICD-10-CM

## 2020-11-19 DIAGNOSIS — G9341 Metabolic encephalopathy: Secondary | ICD-10-CM | POA: Diagnosis present

## 2020-11-19 DIAGNOSIS — Z7982 Long term (current) use of aspirin: Secondary | ICD-10-CM

## 2020-11-19 DIAGNOSIS — I129 Hypertensive chronic kidney disease with stage 1 through stage 4 chronic kidney disease, or unspecified chronic kidney disease: Secondary | ICD-10-CM | POA: Diagnosis present

## 2020-11-19 DIAGNOSIS — A4150 Gram-negative sepsis, unspecified: Secondary | ICD-10-CM | POA: Diagnosis present

## 2020-11-19 DIAGNOSIS — D631 Anemia in chronic kidney disease: Secondary | ICD-10-CM | POA: Diagnosis present

## 2020-11-19 DIAGNOSIS — Z66 Do not resuscitate: Secondary | ICD-10-CM | POA: Diagnosis present

## 2020-11-19 DIAGNOSIS — Z7951 Long term (current) use of inhaled steroids: Secondary | ICD-10-CM

## 2020-11-19 DIAGNOSIS — J449 Chronic obstructive pulmonary disease, unspecified: Secondary | ICD-10-CM | POA: Diagnosis present

## 2020-11-19 DIAGNOSIS — N179 Acute kidney failure, unspecified: Secondary | ICD-10-CM | POA: Diagnosis present

## 2020-11-19 DIAGNOSIS — E872 Acidosis, unspecified: Secondary | ICD-10-CM

## 2020-11-19 DIAGNOSIS — Z20822 Contact with and (suspected) exposure to covid-19: Secondary | ICD-10-CM | POA: Diagnosis present

## 2020-11-19 DIAGNOSIS — E861 Hypovolemia: Secondary | ICD-10-CM

## 2020-11-19 DIAGNOSIS — R6510 Systemic inflammatory response syndrome (SIRS) of non-infectious origin without acute organ dysfunction: Secondary | ICD-10-CM

## 2020-11-19 DIAGNOSIS — Z87891 Personal history of nicotine dependence: Secondary | ICD-10-CM

## 2020-11-19 DIAGNOSIS — J9601 Acute respiratory failure with hypoxia: Secondary | ICD-10-CM | POA: Diagnosis present

## 2020-11-19 LAB — CBC WITH DIFFERENTIAL/PLATELET
Band Neutrophils: 24 %
Basophils Absolute: 0 10*3/uL (ref 0.0–0.1)
Basophils Relative: 0 %
Eosinophils Absolute: 0 10*3/uL (ref 0.0–0.5)
Eosinophils Relative: 0 %
HCT: 34.8 % — ABNORMAL LOW (ref 36.0–46.0)
Hemoglobin: 10.6 g/dL — ABNORMAL LOW (ref 12.0–15.0)
Lymphocytes Relative: 4 %
Lymphs Abs: 0.4 10*3/uL — ABNORMAL LOW (ref 0.7–4.0)
MCH: 33.5 pg (ref 26.0–34.0)
MCHC: 30.5 g/dL (ref 30.0–36.0)
MCV: 110.1 fL — ABNORMAL HIGH (ref 80.0–100.0)
Metamyelocytes Relative: 10 %
Monocytes Absolute: 0 10*3/uL — ABNORMAL LOW (ref 0.1–1.0)
Monocytes Relative: 0 %
Myelocytes: 5 %
Neutro Abs: 7.1 10*3/uL (ref 1.7–7.7)
Neutrophils Relative %: 54 %
Platelets: 85 10*3/uL — ABNORMAL LOW (ref 150–400)
Promyelocytes Relative: 3 %
RBC: 3.16 MIL/uL — ABNORMAL LOW (ref 3.87–5.11)
RDW: 15.8 % — ABNORMAL HIGH (ref 11.5–15.5)
WBC: 9.1 10*3/uL (ref 4.0–10.5)
nRBC: 0 % (ref 0.0–0.2)

## 2020-11-19 LAB — URINALYSIS, ROUTINE W REFLEX MICROSCOPIC
Glucose, UA: NEGATIVE mg/dL
Hgb urine dipstick: NEGATIVE
Ketones, ur: NEGATIVE mg/dL
Nitrite: NEGATIVE
Protein, ur: 100 mg/dL — AB
Specific Gravity, Urine: 1.023 (ref 1.005–1.030)
WBC, UA: >50 WBC/hpf — ABNORMAL HIGH (ref 0–5)
pH: 5 (ref 5.0–8.0)

## 2020-11-19 LAB — HEMOGLOBIN A1C
Hgb A1c MFr Bld: 5.5 % (ref 4.8–5.6)
Mean Plasma Glucose: 111.15 mg/dL

## 2020-11-19 LAB — LACTATE DEHYDROGENASE: LDH: 205 U/L — ABNORMAL HIGH (ref 98–192)

## 2020-11-19 LAB — BLOOD GAS, VENOUS
Acid-base deficit: 11.6 mmol/L — ABNORMAL HIGH (ref 0.0–2.0)
Bicarbonate: 13.8 mmol/L — ABNORMAL LOW (ref 20.0–28.0)
FIO2: 32
O2 Saturation: 25.7 %
Patient temperature: 37
pCO2, Ven: 43.6 mmHg — ABNORMAL LOW (ref 44.0–60.0)
pH, Ven: 7.171 — CL (ref 7.250–7.430)
pO2, Ven: <31 mmHg — CL (ref 32.0–45.0)

## 2020-11-19 LAB — COMPREHENSIVE METABOLIC PANEL
ALT: 126 U/L — ABNORMAL HIGH (ref 0–44)
AST: 108 U/L — ABNORMAL HIGH (ref 15–41)
Albumin: 2.4 g/dL — ABNORMAL LOW (ref 3.5–5.0)
Alkaline Phosphatase: 495 U/L — ABNORMAL HIGH (ref 38–126)
Anion gap: 19 — ABNORMAL HIGH (ref 5–15)
BUN: 36 mg/dL — ABNORMAL HIGH (ref 8–23)
CO2: 16 mmol/L — ABNORMAL LOW (ref 22–32)
Calcium: 7.5 mg/dL — ABNORMAL LOW (ref 8.9–10.3)
Chloride: 102 mmol/L (ref 98–111)
Creatinine, Ser: 3.14 mg/dL — ABNORMAL HIGH (ref 0.44–1.00)
GFR, Estimated: 14 mL/min — ABNORMAL LOW (ref >60)
Glucose, Bld: 95 mg/dL (ref 70–99)
Potassium: 3.8 mmol/L (ref 3.5–5.1)
Sodium: 137 mmol/L (ref 135–145)
Total Bilirubin: 3.8 mg/dL — ABNORMAL HIGH (ref 0.3–1.2)
Total Protein: 5.5 g/dL — ABNORMAL LOW (ref 6.5–8.1)

## 2020-11-19 LAB — BLOOD GAS, ARTERIAL
Acid-base deficit: 22.3 mmol/L — ABNORMAL HIGH (ref 0.0–2.0)
Bicarbonate: 7 mmol/L — ABNORMAL LOW (ref 20.0–28.0)
FIO2: 28
O2 Saturation: 92 %
Patient temperature: 98.6
pCO2 arterial: 26.7 mmHg — ABNORMAL LOW (ref 32.0–48.0)
pH, Arterial: 7.045 — CL (ref 7.350–7.450)
pO2, Arterial: 94.9 mmHg (ref 83.0–108.0)

## 2020-11-19 LAB — GLUCOSE, CAPILLARY
Glucose-Capillary: 98 mg/dL (ref 70–99)
Glucose-Capillary: 120 mg/dL — ABNORMAL HIGH (ref 70–99)

## 2020-11-19 LAB — APTT
aPTT: 37 seconds — ABNORMAL HIGH (ref 24–36)
aPTT: 35 seconds (ref 24–36)
aPTT: 37 seconds — ABNORMAL HIGH (ref 24–36)

## 2020-11-19 LAB — PROCALCITONIN: Procalcitonin: 52 ng/mL

## 2020-11-19 LAB — PROTIME-INR
INR: 1.6 — ABNORMAL HIGH (ref 0.8–1.2)
INR: 2 — ABNORMAL HIGH (ref 0.8–1.2)
Prothrombin Time: 19.3 seconds — ABNORMAL HIGH (ref 11.4–15.2)
Prothrombin Time: 22.3 seconds — ABNORMAL HIGH (ref 11.4–15.2)

## 2020-11-19 LAB — LACTIC ACID, PLASMA
Lactic Acid, Venous: >11 mmol/L (ref 0.5–1.9)
Lactic Acid, Venous: >11 mmol/L (ref 0.5–1.9)

## 2020-11-19 LAB — DIC (DISSEMINATED INTRAVASCULAR COAGULATION)PANEL
D-Dimer, Quant: 10.03 ug/mL-FEU — ABNORMAL HIGH (ref 0.00–0.50)
Fibrinogen: 513 mg/dL — ABNORMAL HIGH (ref 210–475)
INR: 5.6 (ref 0.8–1.2)
Platelets: 83 10*3/uL — ABNORMAL LOW (ref 150–400)
Prothrombin Time: 50.9 seconds — ABNORMAL HIGH (ref 11.4–15.2)
Smear Review: NONE SEEN
aPTT: 125 seconds — ABNORMAL HIGH (ref 24–36)

## 2020-11-19 LAB — TYPE AND SCREEN
ABO/RH(D): O POS
Antibody Screen: NEGATIVE

## 2020-11-19 LAB — TROPONIN I (HIGH SENSITIVITY): Troponin I (High Sensitivity): 162 ng/L (ref <18)

## 2020-11-19 LAB — RETICULOCYTES
Immature Retic Fract: 16.6 % — ABNORMAL HIGH (ref 2.3–15.9)
RBC.: 3.06 MIL/uL — ABNORMAL LOW (ref 3.87–5.11)
Retic Count, Absolute: 67.9 10*3/uL (ref 19.0–186.0)
Retic Ct Pct: 2.2 % (ref 0.4–3.1)

## 2020-11-19 LAB — DIRECT ANTIGLOBULIN TEST (NOT AT ARMC)
DAT, IgG: NEGATIVE
DAT, complement: NEGATIVE

## 2020-11-19 LAB — BRAIN NATRIURETIC PEPTIDE: B Natriuretic Peptide: 1057 pg/mL — ABNORMAL HIGH (ref 0.0–100.0)

## 2020-11-19 LAB — CK: Total CK: 170 U/L (ref 38–234)

## 2020-11-19 LAB — RESP PANEL BY RT-PCR (FLU A&B, COVID) ARPGX2
Influenza A by PCR: NEGATIVE
Influenza B by PCR: NEGATIVE
SARS Coronavirus 2 by RT PCR: NEGATIVE

## 2020-11-19 LAB — MRSA PCR SCREENING: MRSA by PCR: NEGATIVE

## 2020-11-19 MED ORDER — SODIUM CHLORIDE 0.9 % IV SOLN
2.0000 g | INTRAVENOUS | Status: DC
  Administered 2020-11-19: 2 g via INTRAVENOUS
  Filled 2020-11-19: qty 2

## 2020-11-19 MED ORDER — STERILE WATER FOR INJECTION IJ SOLN
INTRAMUSCULAR | Status: AC
  Filled 2020-11-19: qty 10

## 2020-11-19 MED ORDER — SODIUM CHLORIDE 0.9 % IV SOLN
500.0000 mg | INTRAVENOUS | Status: DC
  Administered 2020-11-19: 500 mg via INTRAVENOUS
  Filled 2020-11-19: qty 500

## 2020-11-19 MED ORDER — LACTATED RINGERS IV BOLUS: 500.0000 mL | Freq: Once | INTRAVENOUS | Status: DC

## 2020-11-19 MED ORDER — LACTATED RINGERS IV BOLUS (SEPSIS)
250.0000 mL | Freq: Once | INTRAVENOUS | Status: AC
  Administered 2020-11-19: 250 mL via INTRAVENOUS

## 2020-11-19 MED ORDER — SODIUM BICARBONATE 8.4 % IV SOLN
INTRAVENOUS | Status: AC
  Filled 2020-11-19: qty 50

## 2020-11-19 MED ORDER — ACETAMINOPHEN 325 MG PO TABS: 650.0000 mg | ORAL_TABLET | Freq: Four times a day (QID) | ORAL | Status: DC | PRN

## 2020-11-19 MED ORDER — LACTATED RINGERS IV SOLN: INTRAVENOUS | Status: DC

## 2020-11-19 MED ORDER — ETOMIDATE 2 MG/ML IV SOLN
INTRAVENOUS | Status: AC
  Filled 2020-11-19: qty 20

## 2020-11-19 MED ORDER — NOREPINEPHRINE 4 MG/250ML-% IV SOLN
INTRAVENOUS | Status: AC
  Filled 2020-11-19: qty 250

## 2020-11-19 MED ORDER — HYDROCORTISONE NA SUCCINATE PF 100 MG IJ SOLR
100.0000 mg | Freq: Two times a day (BID) | INTRAMUSCULAR | Status: DC
  Administered 2020-11-19: 100 mg via INTRAVENOUS
  Filled 2020-11-19: qty 2

## 2020-11-19 MED ORDER — VASOPRESSIN 20 UNITS/100 ML INFUSION FOR SHOCK
0.0400 [IU]/min | INTRAVENOUS | Status: DC
  Administered 2020-11-19 (×2): 0.04 [IU]/min via INTRAVENOUS
  Filled 2020-11-19 (×3): qty 100

## 2020-11-19 MED ORDER — PROPOFOL 1000 MG/100ML IV EMUL
INTRAVENOUS | Status: AC
  Filled 2020-11-19: qty 100

## 2020-11-19 MED ORDER — KETAMINE HCL-SODIUM CHLORIDE 100-0.9 MG/10ML-% IV SOSY
1.5000 mg/kg | PREFILLED_SYRINGE | Freq: Once | INTRAVENOUS | Status: AC
  Administered 2020-11-20: 20 mg via INTRAVENOUS
  Filled 2020-11-19: qty 20

## 2020-11-19 MED ORDER — INSULIN ASPART 100 UNIT/ML IJ SOLN: 0.0000 [IU] | INTRAMUSCULAR | Status: DC

## 2020-11-19 MED ORDER — NOREPINEPHRINE 4 MG/250ML-% IV SOLN: 2.0000 ug/min | INTRAVENOUS | Status: DC

## 2020-11-19 MED ORDER — SODIUM BICARBONATE 8.4 % IV SOLN
INTRAVENOUS | Status: AC
  Filled 2020-11-19: qty 100

## 2020-11-19 MED ORDER — STERILE WATER FOR INJECTION IV SOLN
INTRAVENOUS | Status: DC
  Filled 2020-11-19 (×4): qty 1000
  Filled 2020-11-19: qty 150

## 2020-11-19 MED ORDER — NOREPINEPHRINE 4 MG/250ML-% IV SOLN
0.0000 ug/min | INTRAVENOUS | Status: DC
  Administered 2020-11-19: 8 ug/min via INTRAVENOUS
  Administered 2020-11-19: 17 ug/min via INTRAVENOUS
  Filled 2020-11-19: qty 250

## 2020-11-19 MED ORDER — MIDAZOLAM HCL 2 MG/2ML IJ SOLN
INTRAMUSCULAR | Status: AC
  Filled 2020-11-19: qty 2

## 2020-11-19 MED ORDER — SODIUM BICARBONATE 8.4 % IV SOLN
INTRAVENOUS | Status: AC
  Administered 2020-11-19: 100 meq via INTRAVENOUS
  Filled 2020-11-19: qty 100

## 2020-11-19 MED ORDER — SODIUM BICARBONATE 8.4 % IV SOLN
100.0000 meq | Freq: Once | INTRAVENOUS | Status: AC
  Filled 2020-11-19: qty 50

## 2020-11-19 MED ORDER — NOREPINEPHRINE 4 MG/250ML-% IV SOLN: 0.0000 ug/min | INTRAVENOUS | Status: DC

## 2020-11-19 MED ORDER — SODIUM BICARBONATE 8.4 % IV SOLN: 50.0000 meq | INTRAVENOUS | Status: AC

## 2020-11-19 MED ORDER — PROPOFOL 500 MG/50ML IV EMUL
INTRAVENOUS | Status: AC
  Filled 2020-11-19: qty 50

## 2020-11-19 MED ORDER — VANCOMYCIN HCL IN DEXTROSE 1-5 GM/200ML-% IV SOLN: 15.0000 mg/kg | Freq: Once | INTRAVENOUS | Status: DC

## 2020-11-19 MED ORDER — ROCURONIUM BROMIDE 10 MG/ML (PF) SYRINGE
PREFILLED_SYRINGE | INTRAVENOUS | Status: AC
  Filled 2020-11-19: qty 10

## 2020-11-19 MED ORDER — VANCOMYCIN HCL 1000 MG/200ML IV SOLN
1000.0000 mg | Freq: Once | INTRAVENOUS | Status: AC
  Administered 2020-11-19: 1000 mg via INTRAVENOUS
  Filled 2020-11-19: qty 200

## 2020-11-19 MED ORDER — HYDROCORTISONE NA SUCCINATE PF 100 MG IJ SOLR
100.0000 mg | Freq: Once | INTRAMUSCULAR | Status: AC
  Administered 2020-11-19: 100 mg via INTRAVENOUS
  Filled 2020-11-19: qty 2

## 2020-11-19 MED ORDER — ACETAMINOPHEN 325 MG PO TABS
650.0000 mg | ORAL_TABLET | Freq: Once | ORAL | Status: AC
  Administered 2020-11-19: 650 mg via ORAL
  Filled 2020-11-19: qty 2

## 2020-11-19 MED ORDER — LACTATED RINGERS IV BOLUS (SEPSIS)
1000.0000 mL | Freq: Once | INTRAVENOUS | Status: AC
  Administered 2020-11-19: 1000 mL via INTRAVENOUS

## 2020-11-19 MED ORDER — SODIUM BICARBONATE 8.4 % IV SOLN
100.0000 meq | Freq: Once | INTRAVENOUS | Status: AC
  Administered 2020-11-19: 100 meq via INTRAVENOUS
  Filled 2020-11-19: qty 50

## 2020-11-19 MED ORDER — SODIUM CHLORIDE 0.9 % IV SOLN
250.0000 mL | INTRAVENOUS | Status: DC
  Administered 2020-11-19: 250 mL via INTRAVENOUS

## 2020-11-19 MED ORDER — FENTANYL CITRATE (PF) 100 MCG/2ML IJ SOLN
INTRAMUSCULAR | Status: AC
  Administered 2020-11-20: 25 ug
  Filled 2020-11-19: qty 2

## 2020-11-19 MED ORDER — HYDROCORTISONE NA SUCCINATE PF 100 MG IJ SOLR: 100.0000 mg | Freq: Three times a day (TID) | INTRAMUSCULAR | Status: DC

## 2020-11-19 MED ORDER — NOREPINEPHRINE 4 MG/250ML-% IV SOLN
2.0000 ug/min | INTRAVENOUS | Status: DC
  Administered 2020-11-19: 4 ug/min via INTRAVENOUS
  Filled 2020-11-19: qty 250

## 2020-11-19 MED ORDER — CHLORHEXIDINE GLUCONATE CLOTH 2 % EX PADS
6.0000 | MEDICATED_PAD | Freq: Every day | CUTANEOUS | Status: DC
  Administered 2020-11-19: 6 via TOPICAL

## 2020-11-19 MED ORDER — SODIUM CHLORIDE 0.9 % IV SOLN: 250.0000 mL | INTRAVENOUS | Status: DC

## 2020-11-19 MED ORDER — SODIUM CHLORIDE 0.9 % IV SOLN
2.0000 g | INTRAVENOUS | Status: DC
  Administered 2020-11-19: 2 g via INTRAVENOUS
  Filled 2020-11-19: qty 20

## 2020-11-19 MED ORDER — VANCOMYCIN HCL IN DEXTROSE 1-5 GM/200ML-% IV SOLN: 1000.0000 mg | INTRAVENOUS | Status: DC

## 2020-11-19 MED ORDER — PIPERACILLIN-TAZOBACTAM 3.375 G IVPB 30 MIN: 3.3750 g | Freq: Once | INTRAVENOUS | Status: DC

## 2020-11-19 MED ORDER — IOHEXOL 9 MG/ML PO SOLN
500.0000 mL | ORAL | Status: AC
  Administered 2020-11-19: 500 mL via ORAL

## 2020-11-19 MED ORDER — SODIUM CHLORIDE 0.9 % IV BOLUS (SEPSIS)
1000.0000 mL | Freq: Once | INTRAVENOUS | Status: AC
  Administered 2020-11-19: 1000 mL via INTRAVENOUS

## 2020-11-19 MED ORDER — IOHEXOL 9 MG/ML PO SOLN
ORAL | Status: AC
  Filled 2020-11-19: qty 1000

## 2020-11-19 NOTE — Progress Notes (Signed)
eLink Physician-Brief Progress Note Patient Name: Carol Bradley DOB: 1941/03/16 MRN: [id number]   Date of Service  11/21/2020  HPI/Events of Note  ABG on 3 L/min [place] O2 = 7.045/26.7/94.9/7.0. She is already on a NaHCO3 IV infusion at 125 mL/hour.   eICU Interventions  Plan: 1. NaHCO3 200 meq IV now. 2. Increase NaHCO3 IV infusion to 150 mL. 3. Will ask PCCM ground team to evaluate the patient at bedside as she likely requires intubation.      Intervention Category Major Interventions: Acid-Base disturbance - evaluation and management  Lysle Dingwall 11/14/2020, 9:52 PM

## 2020-11-19 NOTE — H&P (Signed)
NAME:  ALLIANA MCAULIFF, MRN:  [id number], DOB:  1941-04-03, LOS: 0 ADMISSION DATE:  11/22/2020, CONSULTATION DATE:  12/03/2020 REFERRING MD:  Jodi Mourning, CHIEF COMPLAINT:  fatigue   History of Present Illness:  80 year old woman with hx of MM on third line chemo (details of prior treatments in Dr. Marice Potter note 11/10/20) presenting with failure to thrive, N/V/D, and fall yesterday without LOC.  Noted to be hypotensive and in multiorgan dysfunction.  Transferred to [facility] for ongoing care.  Currently has some mild abd pain.  Denies chest pain.  +SOB. Wears O2 at night at home. Has not eaten well in 2 days. Decreased Uop  Pertinent  Medical History  COPD CAD Anemia Stage 3 CKD  Significant Hospital Events: Including procedures, antibiotic start and stop dates in addition to other pertinent events   . 5/13 admitted  Interim History / Subjective:  Admitting.  Objective   Blood pressure (!) 91/54, pulse (!) 111, temperature 98.5 F (36.9 C), resp. rate (!) 26, height 5\' 7"  (1.702 m), weight 70.8 kg, SpO2 93 %.        Intake/Output Summary (Last 24 hours) at 11/09/2020 1320 Last data filed at 11/21/2020 1307 Gross per 24 hour  Intake 3791.56 ml  Output --  Net 3791.56 ml   Filed Weights   11/07/2020 0837  Weight: 70.8 kg    Examination: Constitutional: does not appear to be in distress  Eyes: periorbital ecchhymoses on R, pupils equal Ears, nose, mouth, and throat: MMM trachea midline Cardiovascular: RRR, bedside 11/21/20 bad windows but seems to have good squeeze, no effusion Respiratory: Clear, no wheezing or accessory muscle use Gastrointestinal: hypoactive BS, no TTP MSK: No CVA tendernesss Skin: No rashes, normal turgor Neurologic: Moves all 4 ext to command Psychiatric: AOx3 fair insight   Labs/imaging that I havepersonally reviewed  (right click and "Reselect all SmartList Selections" daily)  AKI Lactate > 11 CXR benign INR elevated Plts down No left shift but 24%  bands  Resolved Hospital Problem list     Assessment & Plan:  Shock in setting of poor PO, N/V/D Severe lactic acidemia Acute on chronic (3a) renal injury Acute liver injury with cholestatic injury pattern Mildly elevated BNP of uncertain significance MM on third line therapy (Bortezomib, Cyclophosphamide, Dexamethasone) Immunosuppressed status Thrombocytopenia Coagulopathy- DIC vs. TMA Hx COPD Hx head trauma without LOC  Essentially in multiorgan failure. Differential includes septic shock (typical and nontypical), Bortezomib-associated TMA, adrenal shock, hypovolemic shock  - Peripheral smear, DIC panel, ADAMSTS13, Combs - Stress steroids - Vanc, cefepime - Bicarb drip  - Renal US - Levophed titrated to MAP 65 - CT A/P looking for obvious ischemic colitis signs - CT Head, low suspicion - Formal echo in AM - Son updated - Dr. [place] updated by phone  Best practice (right click and "Reselect all SmartList Selections" daily)  Diet:  NPO but okay to try liquids later if she wants Pain/Anxiety/Delirium protocol (if indicated): No VAP protocol (if indicated): Not indicated DVT prophylaxis: SCD GI prophylaxis: N/A Glucose control:  SSI Yes Central venous access:  Yes, and it is still needed Arterial line:  N/A Foley:  Yes, and it is still needed Mobility:  bed rest  PT consulted: N/A Last date of multidisciplinary goals of care discussion [pending] Code Status:  full code Disposition: ICU  Labs   CBC: Recent Labs  Lab 11/11/2020 0919  WBC 9.1  NEUTROABS 7.1  HGB 10.6*  HCT 34.8*  MCV 110.1*  PLT 85*  Basic Metabolic Panel: Recent Labs  Lab 11/14/2020 0919  NA 137  K 3.8  CL 102  CO2 16*  GLUCOSE 95  BUN 36*  CREATININE 3.14*  CALCIUM 7.5*   GFR: Estimated Creatinine Clearance: 13.9 mL/min (A) (by C-G formula based on SCr of 3.14 mg/dL (H)). Recent Labs  Lab 12/03/2020 0919 11/11/2020 1124  WBC 9.1  --   LATICACIDVEN >11* >11*    Liver  Function Tests: Recent Labs  Lab 11/18/2020 0919  AST 108*  ALT 126*  ALKPHOS 495*  BILITOT 3.8*  PROT 5.5*  ALBUMIN 2.4*   No results for input(s): LIPASE, AMYLASE in the last 168 hours. No results for input(s): AMMONIA in the last 168 hours.  ABG    Component Value Date/Time   PHART 7.384 05/03/2011 1023   PCO2ART 39.2 05/03/2011 1023   PO2ART 63.0 (L) 05/03/2011 1023   HCO3 13.8 (L) 11/29/2020 1123   TCO2 27 05/03/2011 1052   ACIDBASEDEF 11.6 (H) 12/01/2020 1123   O2SAT 25.7 11/12/2020 1123     Coagulation Profile: Recent Labs  Lab 11/30/2020 0919  INR 1.6*    Cardiac Enzymes: Recent Labs  Lab 11/26/2020 0919  CKTOTAL 170    HbA1C: Hgb A1c MFr Bld  Date/Time Value Ref Range Status  12/25/2019 11:09 AM 5.9 (H) 4.8 - 5.6 % Final    Comment:             Prediabetes: 5.7 - 6.4          Diabetes: >6.4          Glycemic control for adults with diabetes: <7.0     CBG: No results for input(s): GLUCAP in the last 168 hours.  Review of Systems:    Positive Symptoms in bold:  Constitutional fevers, chills, weight loss, fatigue, anorexia, malaise  Eyes decreased vision, double vision, eye irritation  Ears, Nose, Mouth, Throat sore throat, trouble swallowing, sinus congestion  Cardiovascular chest pain, paroxysmal nocturnal dyspnea, lower ext edema, palpitations   Respiratory SOB, cough, DOE, hemoptysis, wheezing  Gastrointestinal nausea, vomiting, diarrhea  Genitourinary burning with urination, trouble urinating  Musculoskeletal joint aches, joint swelling, back pain  Integumentary  rashes, skin lesions  Neurological focal weakness, focal numbness, trouble speaking, headaches  Psychiatric depression, anxiety, confusion  Endocrine polyuria, polydipsia, cold intolerance, heat intolerance  Hematologic abnormal bruising, abnormal bleeding, unexplained nose bleeds  Allergic/Immunologic recurrent infections, hives, swollen lymph nodes     Past Medical History:   She,  has a past medical history of Anemia associated with stage 3 chronic renal failure (HCC) (04/13/2016), CAD (coronary artery disease), COPD (chronic obstructive pulmonary disease) ([place]), History of tobacco abuse, Hyperlipidemia, Hypertension, 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   Surgical History:   Past Surgical History:  Procedure Laterality Date  . BACK SURGERY    . CARDIAC CATHETERIZATION  04/2011   right and left cath showing normal right heart pressures,but newly diagnosed coronary artery disease/drug eluting stent placed to RCA with residual disease in the proximal RCA and LAD and ramus, normal LV function and 60-65% EF  . COLONOSCOPY N/A 09/30/2014   Procedure: COLONOSCOPY;  Surgeon: Rogene Houston, MD;  Location: [facility];  Service: Endoscopy;  Laterality: N/A;  225  . CORONARY ANGIOPLASTY WITH STENT PLACEMENT  04/2011   mid RCA: 3.0 X38 mm Promus DES. Residual 40% disease proximally  . ESOPHAGOGASTRODUODENOSCOPY N/A 09/10/2019   Procedure: ESOPHAGOGASTRODUODENOSCOPY (EGD);  Surgeon: Clarene Essex, MD;  Location:  [facility];  Service: Endoscopy;  Laterality: N/A;  . NECK SURGERY       Social History:   reports that she quit smoking about 26 years ago. Her smoking use included cigarettes. She has a 75.00 pack-year smoking history. She has never used smokeless tobacco. She reports that she does not drink alcohol and does not use drugs.   Family History:  Her family history includes Cancer in her father; Heart failure in her mother.   Allergies Allergies  Allergen Reactions  . Lisinopril Cough  . Other     Other reaction(s): Unknown  . Plavix [Clopidogrel Bisulfate] Swelling     Home Medications  Prior to Admission medications   Medication Sig Start Date End Date Taking? Authorizing Provider  acyclovir (ZOVIRAX) 200 MG capsule Take 1 capsule (200 mg total) by mouth 2 (two) times daily. 10/14/20  Yes  Derek Jack, MD  albuterol (VENTOLIN HFA) 108 (90 Base) MCG/ACT inhaler INHALE TWO PUFFS INTO THE LUNGS EVERY 6 HOURS AS NEEDED FOR WHEEZING OR SHORTNESS OF BREATH. 10/29/20  Yes Derek Jack, MD  ALPRAZolam Duanne Moron) 1 MG tablet Take 1 mg by mouth once. 08/26/20  Yes [provider]  aspirin 325 MG tablet Take 325 mg by mouth at bedtime.   Yes [provider]  atorvastatin (LIPITOR) 10 MG tablet Take 10 mg by mouth daily.   Yes [provider]  Calcium Carbonate-Vit D-Min (CALCIUM 600+D PLUS MINERALS) 600-400 MG-UNIT CHEW Chew 1 tablet by mouth 3 (three) times daily.    Yes [provider]  cyanocobalamin (,VITAMIN B-12,) 1000 MCG/ML injection Inject 1,000 mcg into the skin every 30 (thirty) days.    Yes [provider]  dexamethasone (DECADRON) 2 MG tablet TAKE FIVE (5) TABLETS ONCE A WEEK 09/30/20  Yes Derek Jack, MD  diazepam (VALIUM) 5 MG tablet TAKE ONE TABLET BY MOUTH EVERY 6 HOURS AS NEEDED FOR ANXIETY 11/12/20  Yes Derek Jack, MD  diltiazem (CARDIZEM CD) 240 MG 24 hr capsule TAKE ONE CAPSULE BY MOUTH EVERY DAY 12/29/19  Yes [place], Peter M, MD  erythromycin ophthalmic ointment Place into both eyes nightly. 11/03/20  Yes [provider]  famotidine (PEPCID) 20 MG tablet Take 20 mg by mouth 2 (two) times daily as needed.  12/01/19  Yes [provider]  fluticasone (FLONASE) 50 MCG/ACT nasal spray Place 2 sprays into both nostrils as needed.    Yes [provider]  furosemide (LASIX) 40 MG tablet Take 40 mg by mouth daily as needed.  03/02/20  Yes [provider]  gabapentin (NEURONTIN) 100 MG capsule Take 1 capsule (100 mg total) by mouth 3 (three) times daily. 11/10/20  Yes Derek Jack, MD  ipratropium (ATROVENT) 0.06 % nasal spray SMARTSIG:2 Spray(s) Both Nares Twice Daily PRN 11/17/19  Yes [provider]  magnesium oxide (MAG-OX) 400 MG tablet TAKE ONE TABLET BY MOUTH  TWICE DAILY. WHEN TAKING LASIX 09/26/20  Yes Derek Jack, MD  meclizine (ANTIVERT) 25 MG tablet Take 25 mg by mouth 3 (three) times daily as needed. 08/18/20  Yes [provider]  metoprolol tartrate (LOPRESSOR) 25 MG tablet TAKE 1 AND 1/2 TABLETS BY MOUTH TWICE DAILY 06/18/20  Yes [place], Peter M, MD  ondansetron (ZOFRAN ODT) 8 MG disintegrating tablet Take 1 tablet (8 mg total) by mouth every 8 (eight) hours as needed for nausea or vomiting. 09/29/20  Yes Derek Jack, MD  pantoprazole (PROTONIX) 40 MG tablet Take 1 tablet (40 mg total) by mouth 2 (  two) times daily. 09/15/19  Yes Domenic Polite, MD  potassium chloride (KLOR-CON) 10 MEQ tablet Take 1 tablet (10 mEq total) by mouth 3 (three) times daily. 09/03/20  Yes Derek Jack, MD  prednisoLONE acetate (PRED FORTE) 1 % ophthalmic suspension Place 1 drop into the left eye 4 (four) times daily. 10/18/20  Yes [provider]  umeclidinium-vilanterol (ANORO ELLIPTA) 62.5-25 MCG/INH AEPB Inhale 1 puff into the lungs daily. 03/25/19  Yes Derek Jack, MD     Critical care time: 45 minutes not including any separately billable procedures

## 2020-11-19 NOTE — ED Triage Notes (Addendum)
Daughter in law called EMS due to pt have lower back pain, sob and low BP.  Pt c/o of lower back pain and sob. Pt also states she fell yesterday. Unknown time how long she was on the floor. She hit her head. Bruising to her right eye.

## 2020-11-19 NOTE — Progress Notes (Signed)
Ct results noted. Will ask Gi to see in am.  Npo midnight.

## 2020-11-19 NOTE — Progress Notes (Signed)
eLink Physician-Brief Progress Note Patient Name: Carol Bradley DOB: [birth year] MRN: [id number]   Date of Service  11/21/2020  HPI/Events of Note  Multiple issues: 1. Nursing reports new onset of slurred speech, and  2. Increased WOB.  eICU Interventions  Plan: 1. Portable CXR STAT. 2. ABG STAT. 3. Head CT Scan without contrast STAT.     Intervention Category Major Interventions: Other:  Lysle Dingwall 11/18/2020, 9:10 PM

## 2020-11-19 NOTE — Sepsis Progress Note (Signed)
Notified provider of need to order another repeat lactic acid.

## 2020-11-19 NOTE — ED Provider Notes (Addendum)
[facility] Provider Note   CSN: [id number] Arrival date & time: 11/15/2020  0827     History Chief Complaint  Patient presents with  . Shortness of Breath    Carol Bradley is a 80 y.o. female.  Patient with history of anemia, chronic renal failure, CAD, COPD, tobacco use, multiple myeloma currently on treatment last dose last week presents with lightheadedness, general weakness, malaise, cough, fall and shortness of breath.  Patient said yesterday she had diarrhea, nonbloody and one episode of vomiting this morning and she continued to feel weaker.  Patient had a fall yesterday hit the right side of her head.  Unknown how long she was on the floor.  Patient denies any blood clot history, no new leg swelling, no cardiac history of heart failure known.  Patient has had a cough as well.        Past Medical History:  Diagnosis Date  . Anemia associated with stage 3 chronic renal failure ([place]) 04/13/2016  . CAD (coronary artery disease)   . COPD (chronic obstructive pulmonary disease) ([place])   . History of tobacco abuse   . Hyperlipidemia   . Hypertension   . 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 Multiple myeloma ([place])   . Vitamin B12 deficiency 04/15/2016   Overview:  Vitamin B12 level documented 155, January 2016 with the normal range being 211-924    Patient Active Problem List   Diagnosis Date Noted  . Sepsis ([place]) 11/30/2020  . Shock ([place]) 11/07/2020  . Gastric ulcer 11/25/2019  . Abnormal LFTs 11/25/2019  . H. pylori infection 11/25/2019  . Hemorrhagic shock ([place]) 09/11/2019  . Acute respiratory failure ([place])   . Gastrointestinal hemorrhage   . Hypotension   . Elevated troponin 09/05/2019  . Community acquired pneumonia 09/05/2019  . Iron deficiency anemia 07/10/2017  . Zonular dehiscence 01/03/2017  . Status post left cataract extraction 12/13/2016  . CAD (coronary artery disease) 12/01/2016  . Hypertension 12/01/2016  . Hyperlipidemia  12/01/2016  . Goals of care, counseling/discussion 04/15/2016  . Anemia associated with stage 3 chronic renal failure ([place]) 04/13/2016  . 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 Chronic diastolic congestive heart failure ([place]) 12/13/2015  . Hypokalemia 11/02/2015  . History of heart disease 11/02/2015  . Emphysema/COPD ([place]) 10/12/2015  . 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 Incontinence of bowel 08/24/2015  . 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 Severe low back pain 05/10/2015  . Vitamin B12 deficiency 05/06/2015  . Multiple myeloma ([place]) 04/12/2015  . Cramps, extremity 04/01/2015  . Encounter for chemotherapy management 04/01/2015  . Hematoma of hip, right, sequela 04/01/2015  . Anemia of chronic disease 02/26/2015  . Anemia 02/12/2015  . Chronic renal disease, stage 3, moderately decreased glomerular filtration rate between 30-59 mL/min/1.73 square meter (HCC) 02/12/2015  . Elevated serum protein level 02/12/2015  . Hypoalbuminemia 02/12/2015  . Dyspnea on exertion 04/29/2014  . Dyspepsia 05/19/2011    Past Surgical History:  Procedure Laterality Date  . BACK SURGERY    . CARDIAC CATHETERIZATION  04/2011   right and left cath showing normal right heart pressures,but newly diagnosed coronary artery disease/drug eluting stent placed to RCA with residual disease in the proximal RCA and LAD and ramus, normal LV function and 60-65% EF  . COLONOSCOPY N/A 09/30/2014   Procedure: COLONOSCOPY;  Surgeon: Rogene Houston, MD;  [facility];  Service: Endoscopy;  Laterality: N/A;  225  . CORONARY ANGIOPLASTY WITH STENT PLACEMENT  04/2011  mid RCA: 3.0 X38 mm Promus DES. Residual 40% disease proximally  . ESOPHAGOGASTRODUODENOSCOPY N/A 09/10/2019   Procedure: ESOPHAGOGASTRODUODENOSCOPY (EGD);  Surgeon: Clarene Essex, MD;  Location: [facility];  Service: Endoscopy;  Laterality: N/A;  . NECK SURGERY       OB History   No obstetric history on file.     Family History   Problem Relation Age of Onset  . Heart failure Mother   . Cancer Father     Social History   Tobacco Use  . Smoking status: Former Smoker    Packs/day: 3.00    Years: 25.00    Pack years: 75.00    Types: Cigarettes    Quit date: 07/10/1994    Years since quitting: 26.3  . Smokeless tobacco: Never Used  Vaping Use  . Vaping Use: Never used  Substance Use Topics  . Alcohol use: No  . Drug use: Never    Home Medications Prior to Admission medications   Medication Sig Start Date End Date Taking? Authorizing Provider  acyclovir (ZOVIRAX) 200 MG capsule Take 1 capsule (200 mg total) by mouth 2 (two) times daily. 10/14/20  Yes Derek Jack, MD  albuterol (VENTOLIN HFA) 108 (90 Base) MCG/ACT inhaler INHALE TWO PUFFS INTO THE LUNGS EVERY 6 HOURS AS NEEDED FOR WHEEZING OR SHORTNESS OF BREATH. 10/29/20  Yes Derek Jack, MD  ALPRAZolam Duanne Moron) 1 MG tablet Take 1 mg by mouth once. 08/26/20  Yes [provider]  aspirin 325 MG tablet Take 325 mg by mouth at bedtime.   Yes [provider]  atorvastatin (LIPITOR) 10 MG tablet Take 10 mg by mouth daily.   Yes [provider]  Calcium Carbonate-Vit D-Min (CALCIUM 600+D PLUS MINERALS) 600-400 MG-UNIT CHEW Chew 1 tablet by mouth 3 (three) times daily.    Yes [provider]  cyanocobalamin (,VITAMIN B-12,) 1000 MCG/ML injection Inject 1,000 mcg into the skin every 30 (thirty) days.    Yes [provider]  dexamethasone (DECADRON) 2 MG tablet TAKE FIVE (5) TABLETS ONCE A WEEK 09/30/20  Yes Derek Jack, MD  diazepam (VALIUM) 5 MG tablet TAKE ONE TABLET BY MOUTH EVERY 6 HOURS AS NEEDED FOR ANXIETY 11/12/20  Yes Derek Jack, MD  diltiazem (CARDIZEM CD) 240 MG 24 hr capsule TAKE ONE CAPSULE BY MOUTH EVERY DAY 12/29/19  Yes Martin[place], Peter M, MD  erythromycin ophthalmic ointment Place into both eyes nightly. 11/03/20  Yes [provider]  famotidine (PEPCID) 20 MG tablet Take 20  mg by mouth 2 (two) times daily as needed.  12/01/19  Yes [provider]  fluticasone (FLONASE) 50 MCG/ACT nasal spray Place 2 sprays into both nostrils as needed.    Yes [provider]  furosemide (LASIX) 40 MG tablet Take 40 mg by mouth daily as needed.  03/02/20  Yes [provider]  gabapentin (NEURONTIN) 100 MG capsule Take 1 capsule (100 mg total) by mouth 3 (three) times daily. 11/10/20  Yes Derek Jack, MD  ipratropium (ATROVENT) 0.06 % nasal spray SMARTSIG:2 Spray(s) Both Nares Twice Daily PRN 11/17/19  Yes [provider]  magnesium oxide (MAG-OX) 400 MG tablet TAKE ONE TABLET BY MOUTH TWICE DAILY. WHEN TAKING LASIX 09/26/20  Yes Derek Jack, MD  meclizine (ANTIVERT) 25 MG tablet Take 25 mg by mouth 3 (three) times daily as needed. 08/18/20  Yes [provider]  metoprolol tartrate (LOPRESSOR) 25 MG tablet TAKE 1 AND 1/2 TABLETS BY MOUTH TWICE DAILY 06/18/20  Yes [place], Peter M, MD  ondansetron (ZOFRAN ODT) 8 MG disintegrating tablet Take 1 tablet (8 mg total) by mouth every 8 (eight) hours as needed for nausea or vomiting. 09/29/20  Yes Derek Jack, MD  pantoprazole (PROTONIX) 40 MG tablet Take 1 tablet (40 mg total) by mouth 2 (two) times daily. 09/15/19  Yes Domenic Polite, MD  potassium chloride (KLOR-CON) 10 MEQ tablet Take 1 tablet (10 mEq total) by mouth 3 (three) times daily. 09/03/20  Yes Derek Jack, MD  prednisoLONE acetate (PRED FORTE) 1 % ophthalmic suspension Place 1 drop into the left eye 4 (four) times daily. 10/18/20  Yes [provider]  umeclidinium-vilanterol (ANORO ELLIPTA) 62.5-25 MCG/INH AEPB Inhale 1 puff into the lungs daily. 03/25/19  Yes Derek Jack, MD    Allergies    Lisinopril, Other, and Plavix [clopidogrel bisulfate]  Review of Systems   Review of Systems  Constitutional: Positive for fatigue. Negative for chills and fever.  HENT: Negative for congestion.   Eyes:  Negative for visual disturbance.  Respiratory: Positive for cough and shortness of breath.   Cardiovascular: Negative for chest pain.  Gastrointestinal: Negative for abdominal pain and vomiting.  Genitourinary: Negative for dysuria and flank pain.  Musculoskeletal: Negative for back pain, neck pain and neck stiffness.  Skin: Negative for rash.  Neurological: Positive for weakness, light-headedness and headaches.    Physical Exam Updated Vital Signs BP (!) 98/15   Pulse 78   Temp (!) 95.9 F (35.5 C)   Resp 19   Ht _0  (1.702 m)   Wt 70.8 kg   SpO2 (!) 52%   BMI 24.43 kg/m   Physical Exam Vitals and nursing note reviewed.  Constitutional:      Appearance: She is ill-appearing.  HENT:     Head: Normocephalic.     Comments: Dry mm Ecchymosis and swelling infraorbital No midline cervical tenderness Eyes:     General:        Right eye: No discharge.        Left eye: No discharge.     Conjunctiva/sclera: Conjunctivae normal.  Neck:     Trachea: No tracheal deviation.  Cardiovascular:     Rate and Rhythm: Normal rate and regular rhythm.  Pulmonary:     Effort: Pulmonary effort is normal. Tachypnea present.     Breath sounds: Examination of the right-lower field reveals rales. Examination of the left-lower field reveals rales. Rales present.  Abdominal:     General: There is no distension.     Palpations: Abdomen is soft.     Tenderness: There is abdominal tenderness. There is no guarding.     Comments: Mild epig discomfort, no guarding  Musculoskeletal:     Cervical back: Normal range of motion and neck supple.     Right lower leg: No edema.     Left lower leg: No edema.  Skin:    General: Skin is warm.     Capillary Refill: Capillary refill takes more than 3 seconds.     Coloration: Skin is not cyanotic.     Findings: No rash.     Comments: Mild mottling  Neurological:     General: No focal deficit present.     Mental Status: She is alert and oriented to  person, place, and time.  Psychiatric:     Comments: Fatigue appearing, soft spokoen     ED Results / Procedures / Treatments   Labs (all labs ordered are listed, but only abnormal results are displayed) Labs Reviewed  LACTIC  ACID, PLASMA - Abnormal; Notable for the following components:      Result Value   Lactic Acid, Venous >11 (*)    All other components within normal limits  LACTIC ACID, PLASMA - Abnormal; Notable for the following components:   Lactic Acid, Venous >11 (*)    All other components within normal limits  COMPREHENSIVE METABOLIC PANEL - Abnormal; Notable for the following components:   CO2 16 (*)    BUN 36 (*)    Creatinine, Ser 3.14 (*)    Calcium 7.5 (*)    Total Protein 5.5 (*)    Albumin 2.4 (*)    AST 108 (*)    ALT 126 (*)    Alkaline Phosphatase 495 (*)    Total Bilirubin 3.8 (*)    GFR, Estimated 14 (*)    Anion gap 19 (*)    All other components within normal limits  CBC WITH DIFFERENTIAL/PLATELET - Abnormal; Notable for the following components:   RBC 3.16 (*)    Hemoglobin 10.6 (*)    HCT 34.8 (*)    MCV 110.1 (*)    RDW 15.8 (*)    Platelets 85 (*)    Lymphs Abs 0.4 (*)    Monocytes Absolute 0.0 (*)    All other components within normal limits  PROTIME-INR - Abnormal; Notable for the following components:   Prothrombin Time 19.3 (*)    INR 1.6 (*)    All other components within normal limits  APTT - Abnormal; Notable for the following components:   aPTT 37 (*)    All other components within normal limits  URINALYSIS, ROUTINE W REFLEX MICROSCOPIC - Abnormal; Notable for the following components:   Color, Urine AMBER (*)    APPearance CLOUDY (*)    Bilirubin Urine SMALL (*)    Protein, ur 100 (*)    Leukocytes,Ua LARGE (*)    WBC, UA >50 (*)    Bacteria, UA MANY (*)    All other components within normal limits  BRAIN NATRIURETIC PEPTIDE - Abnormal; Notable for the following components:   B Natriuretic Peptide 1,057.0 (*)    All  other components within normal limits  BLOOD GAS, VENOUS - Abnormal; Notable for the following components:   pH, Ven 7.171 (*)    pCO2, Ven 43.6 (*)    pO2, Ven <31.0 (*)    Bicarbonate 13.8 (*)    Acid-base deficit 11.6 (*)    All other components within normal limits  LACTATE DEHYDROGENASE - Abnormal; Notable for the following components:   LDH 205 (*)    All other components within normal limits  RETICULOCYTES - Abnormal; Notable for the following components:   RBC. 3.06 (*)    Immature Retic Fract 16.6 (*)    All other components within normal limits  GLUCOSE, CAPILLARY - Abnormal; Notable for the following components:   Glucose-Capillary 120 (*)    All other components within normal limits  BLOOD GAS, ARTERIAL - Abnormal; Notable for the following components:   pH, Arterial 7.045 (*)    pCO2 arterial 26.7 (*)    Bicarbonate 7.0 (*)    Acid-base deficit 22.3 (*)    All other components within normal limits  CBC - Abnormal; Notable for the following components:   WBC 36.1 (*)    RBC 2.28 (*)    Hemoglobin 7.7 (*)    HCT 24.4 (*)    MCV 107.0 (*)    RDW 16.0 (*)    Platelets  78 (*)    All other components within normal limits  COMPREHENSIVE METABOLIC PANEL - Abnormal; Notable for the following components:   Sodium 155 (*)    Chloride 97 (*)    BUN 41 (*)    Creatinine, Ser 2.89 (*)    Calcium 6.5 (*)    Total Protein 3.9 (*)    Albumin 1.7 (*)    AST 94 (*)    ALT 107 (*)    Alkaline Phosphatase 297 (*)    Total Bilirubin 3.4 (*)    GFR, Estimated 16 (*)    Anion gap 28 (*)    All other components within normal limits  LACTIC ACID, PLASMA - Abnormal; Notable for the following components:   Lactic Acid, Venous >11.0 (*)    All other components within normal limits  LACTIC ACID, PLASMA - Abnormal; Notable for the following components:   Lactic Acid, Venous >11.0 (*)    All other components within normal limits  APTT - Abnormal; Notable for the following  components:   aPTT 37 (*)    All other components within normal limits  PROTIME-INR - Abnormal; Notable for the following components:   Prothrombin Time 22.3 (*)    INR 2.0 (*)    All other components within normal limits  BLOOD GAS, ARTERIAL - Abnormal; Notable for the following components:   pH, Arterial 7.138 (*)    pCO2 arterial 62.6 (*)    pO2, Arterial <31.0 (*)    Acid-base deficit 8.0 (*)    All other components within normal limits  GLUCOSE, CAPILLARY - Abnormal; Notable for the following components:   Glucose-Capillary 23 (*)    All other components within normal limits  GLUCOSE, CAPILLARY - Abnormal; Notable for the following components:   Glucose-Capillary >600 (*)    All other components within normal limits  CBC WITH DIFFERENTIAL/PLATELET - Abnormal; Notable for the following components:   WBC 21.0 (*)    RBC 1.66 (*)    Hemoglobin 5.5 (*)    HCT 18.6 (*)    MCV 112.0 (*)    MCHC 29.6 (*)    RDW 16.6 (*)    Platelets 39 (*)    nRBC 0.3 (*)    Neutro Abs 19.0 (*)    Abs Immature Granulocytes 0.69 (*)    All other components within normal limits  COMPREHENSIVE METABOLIC PANEL - Abnormal; Notable for the following components:   Sodium 159 (*)    Potassium 7.1 (*)    Chloride 88 (*)    CO2 39 (*)    BUN 38 (*)    Creatinine, Ser 2.91 (*)    Albumin 1.2 (*)    AST 1,193 (*)    ALT 831 (*)    Alkaline Phosphatase 217 (*)    Total Bilirubin 3.0 (*)    GFR, Estimated 16 (*)    Anion gap 32 (*)    All other components within normal limits  BRAIN NATRIURETIC PEPTIDE - Abnormal; Notable for the following components:   B Natriuretic Peptide 4,029.8 (*)    All other components within normal limits  GLUCOSE, CAPILLARY - Abnormal; Notable for the following components:   Glucose-Capillary 496 (*)    All other components within normal limits  TROPONIN I (HIGH SENSITIVITY) - Abnormal; Notable for the following components:   Troponin I (High Sensitivity) 162 (*)     All other components within normal limits  TROPONIN I (HIGH SENSITIVITY) - Abnormal; Notable for the following components:  Troponin I (High Sensitivity) 3,320 (*)    All other components within normal limits  RESP PANEL BY RT-PCR (FLU A&B, COVID) [id number]  CULTURE, BLOOD (ROUTINE X 2)  CULTURE, BLOOD (ROUTINE X 2)  MRSA PCR SCREENING  URINE CULTURE  CK  GLUCOSE, CAPILLARY  PROCALCITONIN  HAPTOGLOBIN  CBC  HEPATIC FUNCTION PANEL  MAGNESIUM  PHOSPHORUS  PROTIME-INR  PROCALCITONIN  CALCIUM, IONIZED  BLOOD GAS, ARTERIAL  BASIC METABOLIC PANEL  CBG MONITORING, ED  I-STAT CHEM 8, ED  TYPE AND SCREEN  TYPE AND SCREEN  TROPONIN I (HIGH SENSITIVITY)    EKG None  Radiology CT ABDOMEN PELVIS WO CONTRAST  Result Date: 11/08/2020 CLINICAL DATA:  80 year old with current history of multiple myeloma on third line chemotherapy, presenting with failure to thrive, nausea, vomiting and diarrhea. Fall yesterday. Hypotension. EXAM: CT ABDOMEN AND PELVIS WITHOUT CONTRAST TECHNIQUE: Multidetector CT imaging of the abdomen and pelvis was performed following the standard protocol without IV contrast. COMPARISON:  10/06/2017. FINDINGS: Lower chest: Scarring in the lingula, RIGHT MIDDLE LOBE and both lower lobes. Small BILATERAL pleural effusions. Stable mild-to-moderate cardiomegaly. RIGHT coronary artery stent. Hepatobiliary: Normal unenhanced appearance of the liver. Mildly distended gallbladder without cholelithiasis. No pericholecystic edema/inflammation. Extrahepatic biliary ductal dilation, the common bile duct measuring up to approximately 15 mm diameter, a new finding, likely due to an obstructing approximate 8 mm stone in the distal duct at the ampulla. A second smaller stone is present adjacent to the likely obstructing stone. Pancreas: Approximate 1.7 x 1.6 x 1.8 cm cystic mass in the body of the pancreas (series 2, image 26 and coronal image 49), new since the 2016 CT. No masses elsewhere in the  pancreas. No peripancreatic inflammation. Spleen: Normal unenhanced appearance. Adrenals/Urinary Tract: Normal appearing adrenal glands. Chronic LEFT UPJ stenosis with marked dilation of the LEFT renal pelvis, more so than on the 2016 CT. Approximate 3.6 x 3.3 cm benign cyst arising from the LOWER pole of the LEFT kidney. Allowing for the unenhanced technique, no focal parenchymal abnormality involving the RIGHT kidney. No RIGHT hydronephrosis. No urinary tract calculi. Urinary bladder decompressed by Foley catheter. Stomach/Bowel: Stomach normal in appearance for the degree of distention. Diverticulosis involving the distal ileum. Remainder of the small bowel normal in appearance. Entire colon decompressed. Diffuse colonic diverticulosis without evidence of acute diverticulitis. Appendix not conspicuous, but no pericecal inflammation. Vascular/Lymphatic: Severe aortoiliofemoral atherosclerosis without evidence of aneurysm. No pathologic lymphadenopathy. Reproductive: Absent uterus, presumed surgically absent. No adnexal masses. Other: None. Musculoskeletal: Degenerative disc disease and spondylosis throughout the visualized lower thoracic spine and lumbar spine. Diffuse facet degenerative changes throughout the lumbar spine. Osseous demineralization. No acute fractures. IMPRESSION: 1. Choledocholithiasis. Obstructing approximate 8 mm stone in the distal common bile duct at the ampulla causing dilation of the common duct up to approximately 15 mm. A second smaller stone is suspected in the distal duct. 2. Distended gallbladder.  No evidence of cholelithiasis. 3. New 1.8 cm cystic mass involving the body of the pancreas. Recommend follow up pre and post contrast MRI/MRCP or pancreatic protocol CT in 2 years. This recommendation follows ACR consensus guidelines: Management of Incidental Pancreatic Cysts: A White Paper of the ACR Incidental Findings Committee. J Am Coll Radiol 4287;68:115-726. 4. Distal ileal  diverticulosis and diffuse colonic diverticulosis without evidence of acute diverticulitis. 5. 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 Aortic Atherosclerosis (ICD10-170.0) Electronically Signed   By: Evangeline Dakin M.D.   On: 12/05/2020 19:00   DG  Chest 1 View  Result Date: 12/05/2020 CLINICAL DATA:  Intubation. EXAM: CHEST  1 VIEW COMPARISON:  Earlier today. FINDINGS: Endotracheal tube tip 4.7 cm from the carina. No definite enteric tube is seen. There multiple overlying monitoring devices in place. Left internal jugular line unchanged in position. Small left pleural effusion is unchanged. Mild cardiomegaly. No acute airspace disease or pneumothorax. IMPRESSION: 1. Endotracheal tube tip 4.7 cm from the carina. 2. No visualized enteric tube. 3. Unchanged small left pleural effusion. Electronically Signed   By: Keith Rake M.D.   On: 12/02/2020 23:45   DG Chest 1 View  Result Date: 11/27/2020 CLINICAL DATA:  Central line placement. EXAM: CHEST  1 VIEW COMPARISON:  Chest x-ray from same day at 0942 hours. FINDINGS: New left internal jugular central venous catheter with tip near the cavoatrial junction. The heart size and mediastinal contours are within normal limits. Normal pulmonary vascularity. Unchanged mild bibasilar atelectasis. No focal consolidation, pleural effusion, or pneumothorax. No acute osseous abnormality. IMPRESSION: 1. New left internal jugular central venous catheter without complication. Electronically Signed   By: Titus Dubin M.D.   On: 11/11/2020 15:56   CT HEAD WO CONTRAST  Result Date: 11/08/2020 CLINICAL DATA:  Head trauma, minor. Additional history provided: Patient with history of multiple al [place] on third line chemotherapy presenting with failure to thrive, nausea/vomiting/diarrhea and fall yesterday without loss of consciousness. EXAM: CT HEAD WITHOUT CONTRAST TECHNIQUE: Contiguous axial images were obtained from the base of the skull through  the vertex without intravenous contrast. COMPARISON:  Brain MRI 09/07/2020.  CT angiogram head 01/02/2020. FINDINGS: Brain: Mild cerebral atrophy. Redemonstrated chronic cortical/subcortical infarct within the posterior right cerebral hemisphere in the right MCA/PCA watershed territory. Moderate patchy and ill-defined hypoattenuation within the cerebral white matter, nonspecific but compatible with chronic small vessel ischemic disease. There is no acute intracranial hemorrhage. No demarcated cortical infarct. No extra-axial fluid collection. No evidence of intracranial mass. No midline shift. Vascular: No hyperdense vessel.  Atherosclerotic calcifications. Skull: No calvarial fracture. Sinuses/Orbits: Visualized orbits show no acute finding. Frothy secretions and mild mucosal thickening within the right sphenoid sinus. Mild mucosal thickening within the left sphenoid sinus. IMPRESSION: No evidence of acute intracranial abnormality. 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 Stable moderate cerebral white matter chronic small vessel ischemic disease. Stable mild generalized parenchymal atrophy. Paranasal sinus disease, as described. Electronically Signed   By: Kellie Simmering DO   On: 12/05/2020 18:25   US RENAL  Result Date: 11/18/2020 CLINICAL DATA:  AK I EXAM: RENAL / URINARY TRACT ULTRASOUND COMPLETE COMPARISON:  June 08, 2020, June 01, 2020, May 29, 2020 FINDINGS: Right Kidney: Renal measurements: 8.3 x 4.6 x 4.6 cm = volume: 91.0 mL. Echogenicity within normal limits. No mass or hydronephrosis visualized. Left Kidney: Renal measurements: 9.4 x 5.2 x 4.3 cm = volume: 109 mL. There is moderate LEFT hydronephrosis. There is revisualization of a benign cyst of the superior pole of the LEFT kidney which measures 4.0 x 3.6 x 3.9 cm. Bladder: Appears normal for degree of bladder distention. Jets are not visualized. Other: None. IMPRESSION: There is moderate  LEFT hydronephrosis, new since prior. This could be further evaluated with dedicated noncontrast CT. Electronically Signed   By: Valentino Saxon MD   On: 12/04/2020 15:31   DG CHEST PORT 1 VIEW  Result Date: 11/15/2020 CLINICAL DATA:  Shortness of breath EXAM: PORTABLE CHEST 1 VIEW COMPARISON:  Nov 19, 2020 study obtained earlier in the day FINDINGS: Central  catheter tip is near the cavoatrial junction, stable. No pneumothorax. There is a new small left pleural effusion with left base atelectasis. Lungs elsewhere clear. Heart is upper normal in size with pulmonary vascularity normal. No adenopathy. There is postoperative change in the lower cervical region. IMPRESSION: Stable central catheter position. No pneumothorax. Small left pleural effusion with mild left base atelectasis. Lungs elsewhere clear. Stable cardiac silhouette. Electronically Signed   By: Lowella Grip III M.D.   On: 12/04/2020 21:36   DG Chest Port 1 View  Result Date: 11/09/2020 CLINICAL DATA:  Code sepsis, shortness of breath, fell yesterday, unknown how long she was on the floor, history multiple myeloma, hypertension, former smoker, COPD, coronary artery disease EXAM: PORTABLE CHEST 1 VIEW COMPARISON:  Portable exam 0942 hours compared to 05/10/2020 the lungs FINDINGS: Normal heart size, mediastinal contours, and pulmonary vascularity. Atherosclerotic calcification aorta. Chronic bronchitic changes with slightly increased interstitial markings at bases since previous exam. Remaining lungs clear. No segmental consolidation, pleural effusion or pneumothorax. Prior cervical fusion. IMPRESSION: Chronic bronchitic changes with slightly increased interstitial markings at lung bases, may reflect mild atelectasis. No definite acute infiltrate. Electronically Signed   By: Lavonia Dana M.D.   On: 11/13/2020 10:28    Procedures .Critical Care Performed by: Elnora Morrison, MD Authorized by: Elnora Morrison, MD   Critical care provider  statement:    Critical care time (minutes):  120   Critical care start time:  11/28/2020 9:00 AM   Critical care end time:  11/28/2020 11:00 AM   Critical care time was exclusive of:  Separately billable procedures and treating other patients and teaching time   Critical care was necessary to treat or prevent imminent or life-threatening deterioration of the following conditions:  Sepsis and shock   Critical care was time spent personally by me on the following activities:  Discussions with consultants, evaluation of patient's response to treatment, examination of patient, ordering and performing treatments and interventions, ordering and review of laboratory studies, ordering and review of radiographic studies, pulse oximetry, re-evaluation of patient's condition, obtaining history from patient or surrogate and review of old charts Comments:     shock Ultrasound ED Echo  Date/Time: 11-23-2020 4:44 AM Performed by: Elnora Morrison, MD Authorized by: Elnora Morrison, MD   Procedure details:    Indications: hypotension     Views: parasternal long axis view, parasternal short axis view, apical 4 chamber view and IVC view     Images: archived     Limitations:  Positioning Findings:    Pericardium: no pericardial effusion     LV Function: depressed (30 - 50%)     RV Diameter: normal     IVC: dilated   Impression:    Impression: probable elevated CVP       Medications Ordered in ED Medications  ceFEPIme (MAXIPIME) 2 g in sodium chloride 0.9 % 100 mL IVPB (0 g Intravenous Stopped 11/25/2020 1727)  vancomycin (VANCOCIN) IVPB 1000 mg/200 mL premix (has no administration in time range)  hydrocortisone sodium succinate (SOLU-CORTEF) 100 MG injection 100 mg (100 mg Intravenous Given 11/22/2020 2131)  sodium bicarbonate 150 mEq in sterile water 1,150 mL infusion ( Intravenous Rate/Dose Change 11/23/2020 0338)  insulin aspart (novoLOG) injection 0-15 Units (0 Units Subcutaneous Not Given 11/23/20 0340)   iohexol (OMNIPAQUE) 9 MG/ML oral solution (  Canceled Entry 12/03/2020 1929)  iohexol (OMNIPAQUE) 9 MG/ML oral solution 500 mL ( Oral Canceled Entry 11/23/20 0021)  vasopressin (PITRESSIN) 20 Units in sodium chloride  0.9 % 100 mL infusion-*FOR SHOCK* (0.04 Units/min Intravenous New Bag/Given 11/18/2020 2217)  norepinephrine (LEVOPHED) 56m in 2584mpremix infusion (0 mcg/min Intravenous Stopped 5/May 23, 2022339)  Chlorhexidine Gluconate Cloth 2 % PADS 6 each (6 each Topical Given 11/27/2020 1628)  acetaminophen (TYLENOL) tablet 650 mg (has no administration in time range)  docusate (COLACE) 50 MG/5ML liquid 100 mg (100 mg Per Tube Not Given 5/05-23-22029)  polyethylene glycol (MIRALAX / GLYCOLAX) packet 17 g (has no administration in time range)  fentaNYL (SUBLIMAZE) injection 25 mcg (has no administration in time range)  fentaNYL (SUBLIMAZE) injection 25-100 mcg (has no administration in time range)  ketamine (KETALAR) 500 mg in sodium chloride 0.9 % 100 mL (5 mg/mL) infusion (0 mg/kg/hr  70.8 kg Intravenous Hold 11/2020-05-23053)  dextrose 10 % infusion ( Intravenous Not Given 11/2020/05/23337)  sodium chloride 0.9 % bolus 1,000 mL (0 mLs Intravenous Stopped 11/08/2020 0939)  lactated ringers bolus 1,000 mL (0 mLs Intravenous Stopped 11/21/2020 1053)    And  lactated ringers bolus 1,000 mL (0 mLs Intravenous Stopped 12/07/2020 1131)    And  lactated ringers bolus 250 mL (0 mLs Intravenous Stopped 11/17/2020 1053)  hydrocortisone sodium succinate (SOLU-CORTEF) 100 MG injection 100 mg (100 mg Intravenous Given 12/06/2020 1131)  vancomycin (VANCOREADY) IVPB 1000 mg/200 mL (0 mg Intravenous Stopped 11/10/2020 1842)  acetaminophen (TYLENOL) tablet 650 mg (650 mg Oral Given 12/04/2020 1940)  sodium bicarbonate injection 100 mEq (100 mEq Intravenous Given 11/25/2020 2210)  sodium bicarbonate injection 100 mEq (100 mEq Intravenous Given 11/26/2020 2150)  midazolam (VERSED) 2 MG/2ML injection (  [facility]331)  sterile water  (preservative free) injection (  [facility]  fentaNYL (SUBLIMAZE) 100 MCG/2ML injection (25 mcg  Given 11/11/21/22015)  rocuronium bromide 100 MG/10ML SOSY (  [facility]  etomidate (AMIDATE) 2 MG/ML injection (  [facility]  propofol (DIPRIVAN) 500 MG/50ML infusion (  [facility])  sodium bicarbonate 1 mEq/mL injection (  Duplicate 11/10/98/9203300 ketamine 100 mg in normal saline 10 mL (1087mL) syringe (20 mg Intravenous Given 5/123-May-202216)  sodium bicarbonate injection 50 mEq (0 mEq Intravenous Duplicate 11/23/60/2623335sodium bicarbonate 1 mEq/mL injection (  Duplicate 11/21/54/2516389propofol (DIPRIVAN) 1000 MG/HT/342AJfusion (  Duplicate 11/23/79/1517262sodium bicarbonate 1 mEq/mL injection (  Duplicate 11/16/33/5917416sodium bicarbonate injection 50 mEq (50 mEq Intravenous Given 5/105-23-202220)  sodium bicarbonate injection 100 mEq (100 mEq Intravenous Given 5/105/23/202215)  sodium zirconium cyclosilicate (LOKELMA) packet 10 g (10 g Per Tube Given 5/105/23/2215)  gentamicin (GARAMYCIN) 70 mg in dextrose 5 % 50 mL IVPB (0 mg Intravenous Stopped 5/1May 23, 202215)    ED Course  I have reviewed the triage vital signs and the nursing notes.  Pertinent labs & imaging results that were available during my care of the patient were reviewed by me and considered in my medical decision making (see chart for details).    MDM Rules/Calculators/A&P                          Patient presents with general weakness, syncope, hypotension and tachycardia.  With cancer history and other medical problems differential broad including primarily sepsis vs other including adrenal insufficiency with persistent hypotension/ reported recent steroid medication- IV steroids given, hypokalemia, anemia related, cardiogenic, PE, metabolic/CK, other.  Code sepsis initiated  with cough, hypotension, tachycardia. With new cough pneumonia abx initiated.  IV  fluid bolus initially improved blood pressure however continue to trend down.  Portable chest x-ray pending, no significant fluid overload or large infiltrate, reviewed. IV Antibiotics ordered.  Lactate returned greater than 11, gap of 19.  Creatinine acute renal failure/chronic renal failure 3.  Patient has bicarb of 16 consistent metabolic acidosis, hemoglobin 10 no signs of active bleeding.  Type and screen sent.  Discussed starting norepinephrine, bedside ultrasound will be completed to check IVC and gross systolic function.  LFTs elevated, bili 3.8. Pt initially not stable for CT/ imaging. Concern for multifactorial causes with hx of LFT elevation, shock liver, infectious,stone related, other.  Pt on nasal canula 2 L.   Bedside US mild decr systolic EF, IVC minimal collapse with respiration. Patient received 3.5 L fluids, discussed with nursing, requested 150 ml/ hr and monitor bp. Repeat LR bolus.  Troponin elevated, ekg non specific ST findings, no STEMI. Consulted cardiology on call, pt not stable for evaluation at this time, recommends admission/ evaluation by MICU due to sepsis/ shock. If improves will need echo performed.   Recheck, updated family on critical condition and plan to transfer to [facility], BP improved to approx 90 systolic on 10 levophed. Discussed with local intensivist, recommended transfer to [facility]. Discussed with [facility] ICU team, agreed with transfer, antibiotics will be broadened.   Patient critical condition on transfer discussion and currently full code.  Sepsis - Repeat Assessment  Performed at:    1145 am  Vitals   BP 91/40, pulse 113, resp. rate 31, height _0  (1.702 m), weight 70.8 kg, SpO2  Heart:     tachycardia  Lungs:    Rales  Capillary Refill:   > 2 sec  Peripheral Pulse:   Radial pulse palpable  Skin:     Mottled     Final Clinical Impression(s) / ED Diagnoses Final diagnoses:  Hypoxia  Hypotension due to hypovolemia  SIRS (systemic inflammatory  response syndrome) (HCC)  Lactic acidosis  Septic shock [facility])    Rx / DC Orders ED Discharge Orders    None       Elnora Morrison, MD 01-Dec-2020 3748    Elnora Morrison, MD 12-01-20 [phone]

## 2020-11-19 NOTE — Progress Notes (Signed)
Brief critical care update note.  Called to see the patient by E link.  This is a 80 year old female that presented today to the emergency room.  She had rapidly declined over the last 24 hours.  Apparently the patient fallen at home and was confused.  Initial head CT was negative.  She then became hypotensive and found to have urinary tract infection.  Concomitantly she had multiorgan dysfunction.  Patient has a history of multiple myeloma is currently on immunosuppressive therapy for that.  On arrival to the intensive care unit, patient was mottled from the neck down to the toes.  She is also mildly jaundice with icteric sclera.  Patient was on Levophed at 2 mics per minute and vasopressin fixed dose.  Based on her ABG additional 2 A of sodium bicarb and were given IV.  She remained on a sodium bicarbonate infusion.  Patient did respond to that therapy and became more awake and following simple commands x4 extremities.  Her speech remains garbled but understandable.  She was able to tell me her name and that she was in the hospital.  Patient currently appears toxic sick.  Due to her profound acidosis, altered mental status and hypotension, ketamine was opted for induction.  Prior to administration the patient became less responsive again and was intubated without sedation.  Please see separate note.  Updated patient's son and daughter-in-law at bedside.  Explained the current poor prognosis, plan of care and options of care.  Patient will remain a full code at this time.  Would readdress with the family if the patient's condition continues to decline.  Assessment: Septic shock Profound metabolic acidosis Acute respiratory failure secondary to altered mental status Metabolic encephalopathy Acute hepatic insufficiency Acute renal failure Multiple myeloma with immunosuppressive medication  Plan: Standard ventilator protocol was initiated. Increase sodium bicarbonate solution to 200  mL/h Continue broad-spectrum antibiotics. Patient will need CT of the head but do not feel that patient is stable enough to transfer off of the ICU at this time.  Nor would it change management acutely. Full set of labs to be repeated and pending at this time. Chest x-ray confirmed placement of the endotracheal tube. N.p.o. We will use propofol infusion but start very low-dose and titrate carefully.  If she does not tolerate this may require ketamine infusion. Repeat ABG in 30 minutes.  Critical care time was 75 minutes excluding procedure time.

## 2020-11-19 NOTE — Sepsis Progress Note (Signed)
Pt d/c from [place] to [facility] ICU at 1343. Code Sepsis was cancelled upon transfer by Dr Reather Converse at [phone].

## 2020-11-19 NOTE — Progress Notes (Signed)
Pharmacy Antibiotic Note  Carol Bradley is a 80 y.o. female admitted on 12/01/2020 with pneumonia.  Pharmacy has been consulted for vancomycin and cefepime dosing.  Plan: Vancomycin 1000mg  IV every 48 hours.  Goal trough 15-20 mcg/mL. Cefepime 2gm iv q24h   Height: 5\' 7"  (170.2 cm) Weight: 70.8 kg (156 lb) IBW/kg (Calculated) : 61.6  Temp (24hrs), Avg:98.5 F (36.9 C), Min:98.5 F (36.9 C), Max:98.5 F (36.9 C)  Recent Labs  Lab 11/26/2020 0919 11/26/2020 1124  WBC 9.1  --   CREATININE 3.14*  --   LATICACIDVEN >11* >11*    Estimated Creatinine Clearance: 13.9 mL/min (A) (by C-G formula based on SCr of 3.14 mg/dL (H)).    Allergies  Allergen Reactions  . Lisinopril Cough  . Other     Other reaction(s): Unknown  . Plavix [Clopidogrel Bisulfate] Swelling    Antimicrobials this admission: 5/13 ceftriaxone x 1  5/13 azithromycin x 1 5/13 vancomycin >> 5/13 cefepime >>   Microbiology results: 5/13 BCx: sent  5/13 MRSA ZMO:QHUT  5/13 UCx: sent   5/13 Resp Panel: negative   Thank you for allowing pharmacy to be a part of this patient's care.  [name] Pranathi Winfree 11/13/2020 1:53 PM

## 2020-11-19 NOTE — ED Notes (Signed)
Date and time results received: 12/02/2020 1155 (use smartphrase ".now" to insert current time)  Test: lactic Critical Value: >11  Name of Provider Notified: Dr. Reather Converse  Orders Received? Or Actions Taken?: no/na

## 2020-11-19 NOTE — Sepsis Progress Note (Signed)
eLink is following this Code Sepsis. °

## 2020-11-19 NOTE — Procedures (Signed)
Central Venous Catheter Insertion Procedure Note  LOANN CHAHAL  [id number]  02/24/1941  Date:12/07/2020  Time:4:03 PM   Provider Performing:Ilyanna Baillargeon Cipriano Mile   Procedure: Insertion of Non-tunneled Central Venous [phone]) with US guidance (56979)   Indication(s) Medication administration  Consent Risks of the procedure as well as the alternatives and risks of each were explained to the patient and/or caregiver.  Consent for the procedure was obtained and is signed in the bedside chart  Anesthesia Topical only with 1% lidocaine   Timeout Verified patient identification, verified procedure, site/side was marked, verified correct patient position, special equipment/implants available, medications/allergies/relevant history reviewed, required imaging and test results available.  Sterile Technique Maximal sterile technique including full sterile barrier drape, hand hygiene, sterile gown, sterile gloves, mask, hair covering, sterile ultrasound probe cover (if used).  Procedure Description Area of catheter insertion was cleaned with chlorhexidine and draped in sterile fashion.  With real-time ultrasound guidance a central venous catheter was placed into the left internal jugular vein. Nonpulsatile blood flow and easy flushing noted in all ports.  The catheter was sutured in place and sterile dressing applied.  Complications/Tolerance None; patient tolerated the procedure well. Chest X-ray is ordered to verify placement for internal jugular or subclavian cannulation.   Chest x-ray is not ordered for femoral cannulation.  EBL Minimal  Specimen(s) None

## 2020-11-19 NOTE — Op Note (Signed)
Intubation Procedure Note  Carol Bradley  [id number]  22-Jan-1941  Date:02-Dec-2020  Time:11:47 PM   Provider Performing:Derin Matthes R Acxel Dingee    Procedure: Intubation (31500)  Indication(s) Respiratory Failure  Consent Unable to obtain consent due to emergent nature of procedure.   Anesthesia No anesthesia was required   Time Out Verified patient identification, verified procedure, site/side was marked, verified correct patient position, special equipment/implants available, medications/allergies/relevant history reviewed, required imaging and test results available.   Sterile Technique Usual hand hygeine, masks, and gloves were used   Procedure Description Patient positioned in bed supine.  Sedation given as noted above.  Patient was intubated with endotracheal tube using #3  MacIntosh blade laryngoscope.  View was Grade 3 only epiglottis .  Number of attempts was 1.  Colorimetric CO2 detector was consistent with tracheal placement.   Complications/Tolerance None; patient tolerated the procedure well. Chest X-ray is ordered to verify placement.   EBL None   Specimen(s) None

## 2020-11-19 NOTE — ED Notes (Signed)
Patient's son, Averi Kilty, [phone], would like to be contacted when patient arrives and is settled enough for him to be allowed to visit.

## 2020-11-20 ENCOUNTER — Inpatient Hospital Stay (HOSPITAL_COMMUNITY): Payer: Medicare Other

## 2020-11-20 LAB — BLOOD CULTURE ID PANEL (REFLEXED) - BCID2

## 2020-11-20 LAB — CBC WITH DIFFERENTIAL/PLATELET
Abs Immature Granulocytes: 0.69 10*3/uL — ABNORMAL HIGH (ref 0.00–0.07)
Basophils Absolute: 0.1 10*3/uL (ref 0.0–0.1)
Basophils Relative: 0 %
Eosinophils Absolute: 0 10*3/uL (ref 0.0–0.5)
Eosinophils Relative: 0 %
HCT: 18.6 % — ABNORMAL LOW (ref 36.0–46.0)
Hemoglobin: 5.5 g/dL — CL (ref 12.0–15.0)
Immature Granulocytes: 3 %
Lymphocytes Relative: 4 %
Lymphs Abs: 0.9 10*3/uL (ref 0.7–4.0)
MCH: 33.1 pg (ref 26.0–34.0)
MCHC: 29.6 g/dL — ABNORMAL LOW (ref 30.0–36.0)
MCV: 112 fL — ABNORMAL HIGH (ref 80.0–100.0)
Monocytes Absolute: 0.3 10*3/uL (ref 0.1–1.0)
Monocytes Relative: 2 %
Neutro Abs: 19 10*3/uL — ABNORMAL HIGH (ref 1.7–7.7)
Neutrophils Relative %: 91 %
Platelets: 39 10*3/uL — ABNORMAL LOW (ref 150–400)
RBC: 1.66 MIL/uL — ABNORMAL LOW (ref 3.87–5.11)
RDW: 16.6 % — ABNORMAL HIGH (ref 11.5–15.5)
WBC: 21 10*3/uL — ABNORMAL HIGH (ref 4.0–10.5)
nRBC: 0.3 % — ABNORMAL HIGH (ref 0.0–0.2)

## 2020-11-20 LAB — COMPREHENSIVE METABOLIC PANEL
ALT: 107 U/L — ABNORMAL HIGH (ref 0–44)
ALT: 831 U/L — ABNORMAL HIGH (ref 0–44)
AST: 94 U/L — ABNORMAL HIGH (ref 15–41)
AST: 1193 U/L — ABNORMAL HIGH (ref 15–41)
Albumin: 1.7 g/dL — ABNORMAL LOW (ref 3.5–5.0)
Albumin: 1.2 g/dL — ABNORMAL LOW (ref 3.5–5.0)
Alkaline Phosphatase: 297 U/L — ABNORMAL HIGH (ref 38–126)
Alkaline Phosphatase: 217 U/L — ABNORMAL HIGH (ref 38–126)
Anion gap: 28 — ABNORMAL HIGH (ref 5–15)
Anion gap: 32 — ABNORMAL HIGH (ref 5–15)
BUN: 41 mg/dL — ABNORMAL HIGH (ref 8–23)
BUN: 38 mg/dL — ABNORMAL HIGH (ref 8–23)
CO2: 30 mmol/L (ref 22–32)
CO2: 39 mmol/L — ABNORMAL HIGH (ref 22–32)
Calcium: 6.5 mg/dL — ABNORMAL LOW (ref 8.9–10.3)
Calcium: 16 mg/dL (ref 8.9–10.3)
Chloride: 97 mmol/L — ABNORMAL LOW (ref 98–111)
Chloride: 88 mmol/L — ABNORMAL LOW (ref 98–111)
Creatinine, Ser: 2.89 mg/dL — ABNORMAL HIGH (ref 0.44–1.00)
Creatinine, Ser: 2.91 mg/dL — ABNORMAL HIGH (ref 0.44–1.00)
GFR, Estimated: 16 mL/min — ABNORMAL LOW (ref >60)
GFR, Estimated: 16 mL/min — ABNORMAL LOW (ref >60)
Glucose, Bld: 77 mg/dL (ref 70–99)
Glucose, Bld: 1370 mg/dL (ref 70–99)
Potassium: 5 mmol/L (ref 3.5–5.1)
Potassium: 7.1 mmol/L (ref 3.5–5.1)
Sodium: 155 mmol/L — ABNORMAL HIGH (ref 135–145)
Sodium: 159 mmol/L — ABNORMAL HIGH (ref 135–145)
Total Bilirubin: 3.4 mg/dL — ABNORMAL HIGH (ref 0.3–1.2)
Total Bilirubin: 3 mg/dL — ABNORMAL HIGH (ref 0.3–1.2)
Total Protein: 3.9 g/dL — ABNORMAL LOW (ref 6.5–8.1)
Total Protein: <3 g/dL — ABNORMAL LOW (ref 6.5–8.1)

## 2020-11-20 LAB — BLOOD GAS, ARTERIAL
Acid-base deficit: 8 mmol/L — ABNORMAL HIGH (ref 0.0–2.0)
Bicarbonate: 20.3 mmol/L (ref 20.0–28.0)
FIO2: 100
O2 Saturation: 26.5 %
Patient temperature: 98.6
pCO2 arterial: 62.6 mmHg — ABNORMAL HIGH (ref 32.0–48.0)
pH, Arterial: 7.138 — CL (ref 7.350–7.450)
pO2, Arterial: <31 mmHg — CL (ref 83.0–108.0)

## 2020-11-20 LAB — TYPE AND SCREEN
ABO/RH(D): O POS
Antibody Screen: NEGATIVE

## 2020-11-20 LAB — CBC
HCT: 24.4 % — ABNORMAL LOW (ref 36.0–46.0)
Hemoglobin: 7.7 g/dL — ABNORMAL LOW (ref 12.0–15.0)
MCH: 33.8 pg (ref 26.0–34.0)
MCHC: 31.6 g/dL (ref 30.0–36.0)
MCV: 107 fL — ABNORMAL HIGH (ref 80.0–100.0)
Platelets: 78 10*3/uL — ABNORMAL LOW (ref 150–400)
RBC: 2.28 MIL/uL — ABNORMAL LOW (ref 3.87–5.11)
RDW: 16 % — ABNORMAL HIGH (ref 11.5–15.5)
WBC: 36.1 10*3/uL — ABNORMAL HIGH (ref 4.0–10.5)
nRBC: 0.1 % (ref 0.0–0.2)

## 2020-11-20 LAB — BRAIN NATRIURETIC PEPTIDE: B Natriuretic Peptide: 4029.8 pg/mL — ABNORMAL HIGH (ref 0.0–100.0)

## 2020-11-20 LAB — LACTIC ACID, PLASMA
Lactic Acid, Venous: >11 mmol/L (ref 0.5–1.9)
Lactic Acid, Venous: >11 mmol/L (ref 0.5–1.9)

## 2020-11-20 LAB — GLUCOSE, CAPILLARY
Glucose-Capillary: 23 mg/dL — CL (ref 70–99)
Glucose-Capillary: 496 mg/dL — ABNORMAL HIGH (ref 70–99)
Glucose-Capillary: >600 mg/dL (ref 70–99)

## 2020-11-20 LAB — TROPONIN I (HIGH SENSITIVITY): Troponin I (High Sensitivity): 3320 ng/L (ref <18)

## 2020-11-20 LAB — HAPTOGLOBIN: Haptoglobin: 199 mg/dL (ref 42–346)

## 2020-11-20 MED ORDER — POLYETHYLENE GLYCOL 3350 17 G PO PACK: 17.0000 g | PACK | Freq: Every day | ORAL | Status: DC

## 2020-11-20 MED ORDER — DEXTROSE 10 % IV SOLN: INTRAVENOUS | Status: DC

## 2020-11-20 MED ORDER — DOCUSATE SODIUM 50 MG/5ML PO LIQD: 100.0000 mg | Freq: Two times a day (BID) | ORAL | Status: DC

## 2020-11-20 MED ORDER — GENTAMICIN SULFATE 40 MG/ML IJ SOLN
70.0000 mg | Freq: Once | INTRAVENOUS | Status: AC
  Administered 2020-11-20: 70 mg via INTRAVENOUS
  Filled 2020-11-20: qty 1.75

## 2020-11-20 MED ORDER — FENTANYL CITRATE (PF) 100 MCG/2ML IJ SOLN: 25.0000 ug | INTRAMUSCULAR | Status: DC | PRN

## 2020-11-20 MED ORDER — PROPOFOL 1000 MG/100ML IV EMUL
0.0000 ug/kg/min | INTRAVENOUS | Status: DC
  Administered 2020-11-19: 10 ug/kg/min via INTRAVENOUS

## 2020-11-20 MED ORDER — SODIUM BICARBONATE 8.4 % IV SOLN
50.0000 meq | Freq: Once | INTRAVENOUS | Status: AC
  Administered 2020-11-20: 50 meq via INTRAVENOUS

## 2020-11-20 MED ORDER — SODIUM ZIRCONIUM CYCLOSILICATE 10 G PO PACK
10.0000 g | PACK | Freq: Once | ORAL | Status: AC
  Administered 2020-11-20: 10 g
  Filled 2020-11-20: qty 1

## 2020-11-20 MED ORDER — SODIUM BICARBONATE 8.4 % IV SOLN
100.0000 meq | Freq: Once | INTRAVENOUS | Status: AC
  Administered 2020-11-20: 100 meq via INTRAVENOUS

## 2020-11-20 MED ORDER — SODIUM CHLORIDE 0.9 % IV SOLN
0.5000 mg/kg/h | INTRAVENOUS | Status: DC
  Filled 2020-11-20: qty 5

## 2020-11-20 MED FILL — Medication: Qty: 1 | Status: AC

## 2020-11-21 LAB — URINE CULTURE: Culture: 10000 — AB

## 2020-11-21 LAB — CALCIUM, IONIZED: Calcium, Ionized, Serum: 3.2 mg/dL — ABNORMAL LOW (ref 4.5–5.6)

## 2020-11-22 LAB — CULTURE, BLOOD (ROUTINE X 2)
Special Requests: ADEQUATE
Special Requests: ADEQUATE

## 2020-11-22 LAB — PATHOLOGIST SMEAR REVIEW

## 2020-11-23 LAB — ADAMTS13 ANTIBODY: ADAMTS13 Antibody: <2 Units/mL (ref <12)

## 2020-11-23 LAB — ADAMTS13 ACTIVITY: Adamts 13 Activity: 20.5 % — CL (ref >66.8)

## 2020-11-25 ENCOUNTER — Ambulatory Visit (HOSPITAL_COMMUNITY): Payer: Medicare Other | Admitting: Hematology

## 2020-11-25 ENCOUNTER — Ambulatory Visit (HOSPITAL_COMMUNITY): Payer: Medicare Other

## 2020-11-25 ENCOUNTER — Inpatient Hospital Stay (HOSPITAL_COMMUNITY): Payer: Medicare Other

## 2020-12-08 NOTE — ED Provider Notes (Signed)
[place]  Department of Emergency Medicine   Code Blue CONSULT NOTE  Chief Complaint: Cardiac arrest/unresponsive   Level V Caveat: Unresponsive  History of present illness: I was contacted by the hospital for a CODE BLUE cardiac arrest upstairs and presented to the patient's bedside.  Patient admitted with sepsis.  She had sudden onset of bradycardia with questionable ventricular fibrillation.  Progressed to PEA with asystole and chest compressions were begun.  ROS: Unable to obtain, Level V caveat  Scheduled Meds: . Chlorhexidine Gluconate Cloth  6 each Topical Daily  . docusate  100 mg Per Tube BID  . hydrocortisone sod succinate (SOLU-CORTEF) inj  100 mg Intravenous Q12H  . insulin aspart  0-15 Units Subcutaneous Q4H  . polyethylene glycol  17 g Per Tube Daily  . sodium bicarbonate  100 mEq Intravenous Once  . sodium zirconium cyclosilicate  10 g Per Tube Once   Continuous Infusions: . ceFEPime (MAXIPIME) IV Stopped (11/15/2020 1727)  . dextrose    . ketamine (KETALAR) Adult IV Infusion Stopped (12-05-20 0053)  . norepinephrine (LEVOPHED) Adult infusion 4 mcg/min (11/30/2020 2024)  .  sodium bicarbonate (isotonic) infusion in sterile water 200 mL/hr at 12/07/2020 2337  . [START ON 11/21/2020] vancomycin    . vasopressin 0.04 Units/min (11/10/2020 2217)   PRN Meds:.acetaminophen, fentaNYL (SUBLIMAZE) injection, fentaNYL (SUBLIMAZE) injection Past Medical History:  Diagnosis Date  . 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 CAD (coronary artery disease)   . COPD (chronic obstructive pulmonary disease) ([place])   . History of tobacco abuse   . Hyperlipidemia   . Hypertension   . 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 Multiple myeloma ([place])   . Vitamin B12 deficiency 04/15/2016   Overview:  Vitamin B12 level documented 155, January 2016 with the normal range being 211-924   Past Surgical History:  Procedure Laterality Date  . BACK  SURGERY    . CARDIAC CATHETERIZATION  04/2011   right and left cath showing normal right heart pressures,but newly diagnosed coronary artery disease/drug eluting stent placed to RCA with residual disease in the proximal RCA and LAD and ramus, normal LV function and 60-65% EF  . COLONOSCOPY N/A 09/30/2014   Procedure: COLONOSCOPY;  Surgeon: Rogene Houston, MD;  Location: [facility];  Service: Endoscopy;  Laterality: N/A;  225  . CORONARY ANGIOPLASTY WITH STENT PLACEMENT  04/2011   mid RCA: 3.0 X38 mm Promus DES. Residual 40% disease proximally  . ESOPHAGOGASTRODUODENOSCOPY N/A 09/10/2019   Procedure: ESOPHAGOGASTRODUODENOSCOPY (EGD);  Surgeon: Clarene Essex, MD;  Location: [place];  Service: Endoscopy;  Laterality: N/A;  . NECK SURGERY     Social History   Socioeconomic History  . Marital status: Widowed    Spouse name: Not on file  . Number of children: Not on file  . Years of education: Not on file  . Highest education level: Not on file  Occupational History  . 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 Smoking status: Former Smoker    Packs/day: 3.00    Years: 25.00    Pack years: 75.00    Types: Cigarettes    Quit date: 07/10/1994    Years since quitting: 26.3  . Smokeless tobacco: Never Used  Vaping Use  . Vaping Use: Never used  Substance and Sexual Activity  . Alcohol use: No  . Drug use: Never  . Sexual activity: Not on file  Other Topics Concern  .  Not on file  Social History Narrative  . Not on file   Social Determinants of Health   Financial Resource Strain: Low Risk   . Difficulty of Paying Living Expenses: Not hard at all  Food Insecurity: Not on file  Transportation Needs: Not on file  Physical Activity: Inactive  . Days of Exercise per Week: 0 days  . Minutes of Exercise per Session: 0 min  Stress: No Stress Concern Present  . Feeling of Stress : Not at all  Social Connections: Socially Isolated  . Frequency of  Communication with Friends and Family: More than three times a week  . Frequency of Social Gatherings with Friends and Family: More than three times a week  . Attends Religious Services: Never  . Active Member of Clubs or Organizations: No  . Attends Archivist Meetings: Never  . Marital Status: Widowed  Intimate Partner Violence: Not At Risk  . Fear of Current or Ex-Partner: No  . Emotionally Abused: No  . Physically Abused: No  . Sexually Abused: No   Allergies  Allergen Reactions  . Lisinopril Cough  . Other     Other reaction(s): Unknown  . Plavix [Clopidogrel Bisulfate] Swelling    Last set of Vital Signs (not current) Vitals:   11-23-20 0030 Nov 23, 2020 0045  BP: 131/63 (!) 145/60  Pulse: 78   Resp: (!) 0 20  Temp: (!) 97.34 F (36.3 C) (!) 97.16 F (36.2 C)  SpO2: (!) 52%       Physical Exam  Gen: unresponsive ET tube in place Cardiovascular: pulseless  Resp: apneic. Breath sounds equal bilaterally with bagging  Abd: nondistended  Neuro: GCS 3, unresponsive to pain  HEENT: No blood in posterior pharynx, gag reflex absent  Neck: No crepitus  Musculoskeletal: No deformity  Skin: Mottled  Procedures    CRITICAL CARE Performed by: Ezequiel Essex Total critical care time: 58 Critical care time was exclusive of separately billable procedures and treating other patients. Critical care was necessary to treat or prevent imminent or life-threatening deterioration. Critical care was time spent personally by me on the following activities: development of treatment plan with patient and/or surrogate as well as nursing, discussions with consultants, evaluation of patient's response to treatment, examination of patient, obtaining history from patient or surrogate, ordering and performing treatments and interventions, ordering and review of laboratory studies, ordering and review of radiographic studies, pulse oximetry and re-evaluation of patient's  condition.   Directed/Performed by: Ezequiel Essex I personally directed ancillary staff and/or performed CPR in an effort to regain return of spontaneous circulation and to maintain cardiac, neuro and systemic perfusion.    Medical Decision making  PEA arrest progressed to asystole.  Assessment and Plan  ACS protocol with chest compressions, epinephrine, bicarb, calcium, D50.  Patient with tenuous pulses returned after about 20 to 25 minutes. She remains mottled and hypotensive.  EKG shows ST elevation in aVR with diffuse ST depressions elsewhere. Concern for global ischemia.   Critical care ground team was delayed at [facility]  Patient continued to have tenuous blood pressure and pulse in the ICU.  Continued supplemental IV epinephrine, calcium and bicarb. Hypoglycemia 23 was treated with D50 Repeat labs are sent  Gust with Dr. Oletta Darter of [facility] and Dr. Earlie Server of critical care at bedside.  Patient remains extremely tenuous and unstable.  Family updated by critical care team. They have elected to make the patient DNR and we will not proceed with further resuscitative efforts.  Ezequiel Essex, MD Dec 02, 2020 [phone]

## 2020-12-08 NOTE — Progress Notes (Signed)
Brief critical care note  Called by E link physician to evaluate the patient postcardiac arrest.  Patient developed PEA cardiac arrest that was preceded with perfused ventricular tachycardia.  Patient had a prolonged cardiac arrest event with ROSC being achieved approximately 1 hour after initial PEA initiation.  On arrival met with patient's family, son and daughter-in-law.  Explained to them that the patient is not going to survive this septic shock event.  They understood the gravity of the situation and decided not to perform further resuscitation efforts.  They would like to continue the ventilator and current medications until the chaplain arrives.  They completely understand the patient is unfortunately not survive.  Patient is now DNR.  Answered all questions.  Critical care time was 25 minutes.

## 2020-12-08 NOTE — Progress Notes (Signed)
Chaplain established relationship of care and concern for pt's family, offering ministry of presence as son and daughter-in-law talkied lovingly about their mother. Family explained their mother was a very devoted [place] who would appreciate knowing the Chaplain had come in her last hour.  Chaplain offered short service of scripture and prayers.    Chaplain available for additional support if needed.  [place]

## 2020-12-08 NOTE — Progress Notes (Signed)
86 MG-8.6 ML ketamine wasted in stericycle with Research officer, political party. Pharmacy notified.

## 2020-12-08 NOTE — Progress Notes (Signed)
eLink Physician-Brief Progress Note Patient Name: Carol Bradley DOB: 1940/09/07 MRN: [id number]   Date of Service  11/21/2020  HPI/Events of Note  Multiple issues: 1. ABG on 100%/PRVC 28/TV 450/P 5 = 7.138/62.6/<31.0/20.3, 2. K+ = 5.0 and Creatinine = 2.89, Lactic Acid > 11.0 WBC = 36.1 and PCT = 5.2. Hgb = 7.7. Growing GNR in both bottles of one set of blood cultures from 12-07-2020. Currently on Vancomycin and Cefepime. PCCM rounding team aware of abdominal CT Scan findings and plans to consult GI in the AM.   eICU Interventions  Plan: 1. Increase PRVC rate to 30 (Patient air traps when RR > 30). 2. NaHCO3 100 meq IV now. 3. Repeat ABG at 2 AM. 4. RT to place A-line. 5. Pharmacy to enter a one time dose of Gentamicin for GNR synergy.  6. Place OGT, 7. Abdominal film to confirm OGT placement.  8. Lokelma 10 gm per tube X 1 post OGT placement. 9. Repeat BMP at 8 AM.     Intervention Category Major Interventions: Hypotension - evaluation and management;Acid-Base disturbance - evaluation and management;Respiratory failure - evaluation and management  [name] [name] 11/16/2020, 1:41 AM

## 2020-12-08 NOTE — Death Summary Note (Addendum)
DEATH SUMMARY   Patient Details  Name: ELENY CORTEZ MRN: [id number] DOB: 1940/07/12  Admission/Discharge Information   Admit Date:  11-23-2020  Date of Death: Date of Death: Nov 24, 2020  Time of Death: Time of Death: 0435  Length of Stay: 1  Referring Physician: [name], MD    Diagnoses  Septic shock secondary to gram negative rod bacteremia suspected urinary source vs cholangitis In hospital PEA arrest Multiorgan failure Profound lactic acidemia Acute hypoxemic respiratory failure Acute liver failure Acute kidney failure Acute anemia question hemolytic vs. 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 Tomie China note 11/10/20) presenting with failure to thrive, N/V/D, and fall yesterday without LOC.  Noted to be hypotensive and in multiorgan dysfunction.  Transferred to [facility] for ongoing [facility].  She was given broad spectrum antibiotics, aggressive fluid resuscitation, and supplemental bicarbonate.  Unfortunately shortly after arrival to [facility] progressed into refractory acidemia-induced shock and respiratory distress resulting in emergent intubation, PEA arrest and ultimately death.  Pertinent Labs and Studies  Significant Diagnostic Studies CT ABDOMEN PELVIS WO CONTRAST  Result Date: Nov 23, 2020 CLINICAL DATA:  80 year old with current history of multiple myeloma on third line chemotherapy, presenting with failure to thrive, nausea, vomiting and diarrhea. Fall yesterday. Hypotension. EXAM: CT ABDOMEN AND PELVIS WITHOUT CONTRAST TECHNIQUE: Multidetector CT imaging of the abdomen and pelvis was performed following the standard protocol without IV contrast. COMPARISON:  10/06/2017. FINDINGS: Lower chest: Scarring in  the lingula, RIGHT MIDDLE LOBE and both lower lobes. Small BILATERAL pleural effusions. Stable mild-to-moderate cardiomegaly. RIGHT coronary artery stent. Hepatobiliary: Normal unenhanced appearance of the liver. Mildly distended gallbladder without cholelithiasis. No pericholecystic edema/inflammation. Extrahepatic biliary ductal dilation, the common bile duct measuring up to approximately 15 mm diameter, a new finding, likely due to an obstructing approximate 8 mm stone in the distal duct at the ampulla. A second smaller stone is present adjacent to the likely obstructing stone. Pancreas: Approximate 1.7 x 1.6 x 1.8 cm cystic mass in the body of the pancreas (series 2, image 26 and coronal image 49), new since the 09/20/14 CT. No masses elsewhere in the pancreas. No peripancreatic inflammation. Spleen: Normal unenhanced appearance. Adrenals/Urinary Tract: Normal appearing adrenal glands. 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 Approximate 3.6 x 3.3 cm benign cyst arising from the LOWER pole of the LEFT kidney. Allowing for the unenhanced technique, no focal parenchymal abnormality involving the RIGHT kidney. No RIGHT hydronephrosis. No urinary tract calculi. Urinary bladder decompressed by Foley catheter. Stomach/Bowel: Stomach normal in appearance for the degree of distention. Diverticulosis involving the distal ileum. Remainder of the small bowel normal in appearance. Entire colon decompressed. Diffuse colonic diverticulosis without evidence of acute diverticulitis. Appendix not conspicuous, but no pericecal inflammation. Vascular/Lymphatic: Severe aortoiliofemoral atherosclerosis without evidence of aneurysm. No pathologic lymphadenopathy. Reproductive: Absent uterus, presumed surgically absent. No adnexal masses. Other: None. Musculoskeletal: Degenerative disc disease and spondylosis throughout the visualized lower thoracic spine and lumbar spine.  Diffuse facet degenerative changes throughout the lumbar spine. Osseous demineralization. No acute fractures. IMPRESSION: 1. Choledocholithiasis. Obstructing approximate 8 mm stone in the distal common bile duct at the ampulla causing dilation of the common duct up to approximately 15 mm. A second smaller stone  is suspected in the distal duct. 2. Distended gallbladder.  No evidence of cholelithiasis. 3. New 1.8 cm cystic mass involving the body of the pancreas. Recommend follow up pre and post contrast MRI/MRCP or pancreatic protocol CT in 2 years. This recommendation follows ACR consensus guidelines: Management of Incidental Pancreatic Cysts: A White Paper of the ACR Incidental Findings Committee. J Am Coll Radiol 5638;93:734-287. 4. Distal ileal diverticulosis and diffuse colonic diverticulosis without evidence of acute diverticulitis. 5. 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 Aortic Atherosclerosis (ICD10-170.0) Electronically Signed   By: Evangeline Dakin M.D.   On: 11/11/2020 19:00   DG Chest 1 View  Result Date: 12/04/2020 CLINICAL DATA:  Intubation. EXAM: CHEST  1 VIEW COMPARISON:  Earlier today. FINDINGS: Endotracheal tube tip 4.7 cm from the carina. No definite enteric tube is seen. There multiple overlying monitoring devices in place. Left internal jugular line unchanged in position. Small left pleural effusion is unchanged. Mild cardiomegaly. No acute airspace disease or pneumothorax. IMPRESSION: 1. Endotracheal tube tip 4.7 cm from the carina. 2. No visualized enteric tube. 3. Unchanged small left pleural effusion. Electronically Signed   By: Keith Rake M.D.   On: 11/17/2020 23:45   DG Chest 1 View  Result Date: 12/04/2020 CLINICAL DATA:  Central line placement. EXAM: CHEST  1 VIEW COMPARISON:  Chest x-ray from same day at 0942 hours. FINDINGS: New left internal jugular central venous catheter with tip near the cavoatrial junction. The heart size and mediastinal  contours are within normal limits. Normal pulmonary vascularity. Unchanged mild bibasilar atelectasis. No focal consolidation, pleural effusion, or pneumothorax. No acute osseous abnormality. IMPRESSION: 1. New left internal jugular central venous catheter without complication. Electronically Signed   By: Titus Dubin M.D.   On: 11/18/2020 15:56   CT HEAD WO CONTRAST  Result Date: 11/26/2020 CLINICAL DATA:  Head trauma, minor. Additional history provided: Patient with history of multiple al Oma on third line chemotherapy presenting with failure to thrive, nausea/vomiting/diarrhea and fall yesterday without loss of consciousness. EXAM: CT HEAD WITHOUT CONTRAST TECHNIQUE: Contiguous axial images were obtained from the base of the skull through the vertex without intravenous contrast. COMPARISON:  Brain MRI 09/07/2020.  CT angiogram head 01/02/2020. FINDINGS: Brain: Mild cerebral atrophy. Redemonstrated chronic cortical/subcortical infarct within the posterior right cerebral hemisphere in the right MCA/PCA watershed territory. Moderate patchy and ill-defined hypoattenuation within the cerebral white matter, nonspecific but compatible with chronic small vessel ischemic disease. There is no acute intracranial hemorrhage. No demarcated cortical infarct. No extra-axial fluid collection. No evidence of intracranial mass. No midline shift. Vascular: No hyperdense vessel.  Atherosclerotic calcifications. Skull: No calvarial fracture. Sinuses/Orbits: Visualized orbits show no acute finding. Frothy secretions and mild mucosal thickening within the right sphenoid sinus. Mild mucosal thickening within the left sphenoid sinus. IMPRESSION: No evidence of acute intracranial abnormality. 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 Stable moderate cerebral white matter chronic small vessel ischemic disease. Stable mild generalized parenchymal atrophy. Paranasal sinus disease, as  described. Electronically Signed   By: Kellie Simmering DO   On: 11/14/2020 18:25   US RENAL  Result Date: 11/29/2020 CLINICAL DATA:  AK I EXAM: RENAL / URINARY TRACT ULTRASOUND COMPLETE COMPARISON:  June 08, 2020, June 01, 2020, May 29, 2020 FINDINGS: Right Kidney: Renal measurements: 8.3 x 4.6 x 4.6 cm = volume: 91.0 mL. Echogenicity within normal limits. No mass or hydronephrosis visualized. Left Kidney: Renal measurements: 9.4 x 5.2 x 4.3 cm = volume:  109 mL. There is moderate LEFT hydronephrosis. There is revisualization of a benign cyst of the superior pole of the LEFT kidney which measures 4.0 x 3.6 x 3.9 cm. Bladder: Appears normal for degree of bladder distention. Jets are not visualized. Other: None. IMPRESSION: There is moderate LEFT hydronephrosis, new since prior. This could be further evaluated with dedicated noncontrast CT. Electronically Signed   By: Valentino Saxon MD   On: 11/08/2020 15:31   DG CHEST PORT 1 VIEW  Result Date: 11/11/2020 CLINICAL DATA:  Shortness of breath EXAM: PORTABLE CHEST 1 VIEW COMPARISON:  Nov 19, 2020 study obtained earlier in the day FINDINGS: Central catheter tip is near the cavoatrial junction, stable. No pneumothorax. There is a new small left pleural effusion with left base atelectasis. Lungs elsewhere clear. Heart is upper normal in size with pulmonary vascularity normal. No adenopathy. There is postoperative change in the lower cervical region. IMPRESSION: Stable central catheter position. No pneumothorax. Small left pleural effusion with mild left base atelectasis. Lungs elsewhere clear. Stable cardiac silhouette. Electronically Signed   By: Lowella Grip III M.D.   On: 11/18/2020 21:36   DG Chest Port 1 View  Result Date: 11/16/2020 CLINICAL DATA:  Code sepsis, shortness of breath, fell yesterday, unknown how long she was on the floor, history multiple myeloma, hypertension, former smoker, COPD, coronary artery disease EXAM: PORTABLE  CHEST 1 VIEW COMPARISON:  Portable exam 0942 hours compared to 05/10/2020 the lungs FINDINGS: Normal heart size, mediastinal contours, and pulmonary vascularity. Atherosclerotic calcification aorta. Chronic bronchitic changes with slightly increased interstitial markings at bases since previous exam. Remaining lungs clear. No segmental consolidation, pleural effusion or pneumothorax. Prior cervical fusion. IMPRESSION: Chronic bronchitic changes with slightly increased interstitial markings at lung bases, may reflect mild atelectasis. No definite acute infiltrate. Electronically Signed   By: Lavonia Dana M.D.   On: 12/07/2020 10:28    Microbiology Recent Results (from the past 240 hour(s))  Blood Culture (routine x 2)     Status: None (Preliminary result)   Collection Time: 11/08/2020  9:19 AM   Specimen: BLOOD  Result Value Ref Range Status   Specimen Description   Final    BLOOD RIGHT ANTECUBITAL Performed at [facility], [address] [place], [place] [address]    Special Requests   Final    BOTTLES DRAWN AEROBIC AND ANAEROBIC Blood Culture adequate volume Performed at [facility], [address], [place], [place] [address]    Culture  Setup Time   Final    GRAM NEGATIVE RODS BOTH ANAEROBIC AND AEROBIC BOTTLES Gram Stain Report Called to,Read Back By and Verified With: CHASITY LILLARD 5/14 @ 0038 BY S. BEARD PATIENT DISCHARGED OR EXPIRED Performed at [facility], [place] [address] [place], [place] [address]    Culture GRAM NEGATIVE RODS  Final   Report Status PENDING  Incomplete  Blood Culture (routine x 2)     Status: None (Preliminary result)   Collection Time: 11/07/2020  9:35 AM   Specimen: BLOOD  Result Value Ref Range Status   Specimen Description BLOOD LEFT ANTECUBITAL  Final   Special Requests   Final    BOTTLES DRAWN AEROBIC AND ANAEROBIC Blood Culture adequate volume   Culture  Setup Time   Final    GRAM NEGATIVE RODS ANAEROBIC BOTTLE Gram Stain Report Called to,Read  Back By and Verified With: C LILLARD 5/14 @ 0039 BY S. BEARD    Culture   Final    NO GROWTH <  65 HOURS Performed at [facility], [address] [place], [place] [address]    Report Status PENDING  Incomplete  Resp Panel by RT-PCR (Flu A&B, Covid) Nasopharyngeal Swab     Status: None   Collection Time: 11/11/2020 12:07 PM   Specimen: Nasopharyngeal Swab; Nasopharyngeal(NP) swabs in vial transport medium  Result Value Ref Range Status   SARS Coronavirus 2 by RT PCR NEGATIVE NEGATIVE Final    Comment: (NOTE) SARS-CoV-2 target nucleic acids are NOT DETECTED.  The SARS-CoV-2 RNA is generally detectable in upper respiratory specimens during the acute phase of infection. The lowest concentration of SARS-CoV-2 viral copies this assay can detect is 138 copies/mL. A negative result does not preclude SARS-Cov-2 infection and should not be used as the sole basis for treatment or other patient management decisions. A negative result may occur with  improper specimen collection/handling, submission of specimen other than nasopharyngeal swab, presence of viral mutation(s) within the areas targeted by this assay, and inadequate number of viral copies(<138 copies/mL). A negative result must be combined with clinical observations, patient history, and epidemiological information. The expected result is Negative.  Fact Sheet for Patients:  EntrepreneurPulse.com.au  Fact Sheet for Healthcare Providers:  IncredibleEmployment.be  This test is no t yet approved or cleared by the [place] FDA and  has been authorized for detection and/or diagnosis of SARS-CoV-2 by FDA under an Emergency Use Authorization (EUA). This EUA will remain  in effect (meaning this test can be used) for the duration of the COVID-19 declaration under Section 564(b)(1) of the Act, 21 U.S.C.section 360bbb-3(b)(1), unless the authorization is terminated  or revoked sooner.        Influenza A by PCR NEGATIVE NEGATIVE Final   Influenza B by PCR NEGATIVE NEGATIVE Final    Comment: (NOTE) The Xpert Xpress SARS-CoV-2/FLU/RSV plus assay is intended as an aid in the diagnosis of influenza from Nasopharyngeal swab specimens and should not be used as a sole basis for treatment. Nasal washings and aspirates are unacceptable for Xpert Xpress SARS-CoV-2/FLU/RSV testing.  Fact Sheet for Patients: EntrepreneurPulse.com.au  Fact Sheet for Healthcare Providers: IncredibleEmployment.be  This test is not yet approved or cleared by the [place] FDA and has been authorized for detection and/or diagnosis of SARS-CoV-2 by FDA under an Emergency Use Authorization (EUA). This EUA will remain in effect (meaning this test can be used) for the duration of the COVID-19 declaration under Section 564(b)(1) of the Act, 21 U.S.C. section 360bbb-3(b)(1), unless the authorization is terminated or revoked.  Performed at [facility], [address] [place], [place] [address]   MRSA PCR Screening     Status: None   Collection Time: 11/08/2020  2:46 PM   Specimen: Nasopharyngeal  Result Value Ref Range Status   MRSA by PCR NEGATIVE NEGATIVE Final    Comment:        The GeneXpert MRSA Assay (FDA approved for NASAL specimens only), is one component of a comprehensive MRSA colonization surveillance program. It is not intended to diagnose MRSA infection nor to guide or monitor treatment for MRSA infections. Performed at [facility], [place] [address] [place], [place] [address]     Lab Basic Metabolic Panel: Recent Labs  Lab 12/02/2020 0919 11/13/2020 2233 December 08, 2020 0245  NA 137 155* 159*  K 3.8 5.0 7.1*  CL 102 97* 88*  CO2 16* 30 39*  GLUCOSE 95 77 1,370*  BUN 36* 41* 38*  CREATININE 3.14* 2.89* 2.91*  CALCIUM 7.5* 6.5* 16.0*  Liver Function Tests: Recent Labs  Lab 11/27/2020 0919 11/09/2020 2233 Dec 06, 2020 0245  AST  108* 94* 1,193*  ALT 126* 107* 831*  ALKPHOS 495* 297* 217*  BILITOT 3.8* 3.4* 3.0*  PROT 5.5* 3.9* <3.0*  ALBUMIN 2.4* 1.7* 1.2*   No results for input(s): LIPASE, AMYLASE in the last 168 hours. No results for input(s): AMMONIA in the last 168 hours. CBC: Recent Labs  Lab 11/21/2020 0919 11/12/2020 2233 2020/12/06 0245  WBC 9.1 36.1* 21.0*  NEUTROABS 7.1  --  19.0*  HGB 10.6* 7.7* 5.5*  HCT 34.8* 24.4* 18.6*  MCV 110.1* 107.0* 112.0*  PLT 85* 78* 39*   Cardiac Enzymes: Recent Labs  Lab 11/22/2020 0919  CKTOTAL 170   Sepsis Labs: Recent Labs  Lab 11/29/2020 0919 12/01/2020 1124 12/05/2020 2233 11/22/2020 2234 06-Dec-2020 0134 Dec 06, 2020 0245  PROCALCITON  --   --  52.00  --   --   --   WBC 9.1  --  36.1*  --   --  21.0*  LATICACIDVEN >11* >11*  --  >11.0* >11.0*  --

## 2020-12-08 DEATH — deceased
